# Patient Record
Sex: Female | Born: 1945 | Race: Black or African American | Hispanic: No | Smoking: Never smoker
Health system: Southern US, Community
[De-identification: ages and names within clinical notes are randomized; demographics above are authoritative.]

## PROBLEM LIST (undated history)

## (undated) DIAGNOSIS — K5900 Constipation, unspecified: Secondary | ICD-10-CM

## (undated) DIAGNOSIS — R609 Edema, unspecified: Secondary | ICD-10-CM

## (undated) DIAGNOSIS — R34 Anuria and oliguria: Secondary | ICD-10-CM

## (undated) DIAGNOSIS — Z8719 Personal history of other diseases of the digestive system: Secondary | ICD-10-CM

## (undated) DIAGNOSIS — K922 Gastrointestinal hemorrhage, unspecified: Secondary | ICD-10-CM

## (undated) DIAGNOSIS — M2540 Effusion, unspecified joint: Secondary | ICD-10-CM

## (undated) DIAGNOSIS — M2550 Pain in unspecified joint: Secondary | ICD-10-CM

## (undated) DIAGNOSIS — N289 Disorder of kidney and ureter, unspecified: Secondary | ICD-10-CM

## (undated) DIAGNOSIS — H9190 Unspecified hearing loss, unspecified ear: Secondary | ICD-10-CM

## (undated) DIAGNOSIS — R7881 Bacteremia: Secondary | ICD-10-CM

## (undated) DIAGNOSIS — I10 Essential (primary) hypertension: Secondary | ICD-10-CM

## (undated) DIAGNOSIS — N2581 Secondary hyperparathyroidism of renal origin: Secondary | ICD-10-CM

## (undated) DIAGNOSIS — T82598A Other mechanical complication of other cardiac and vascular devices and implants, initial encounter: Secondary | ICD-10-CM

## (undated) DIAGNOSIS — K219 Gastro-esophageal reflux disease without esophagitis: Secondary | ICD-10-CM

## (undated) DIAGNOSIS — K759 Inflammatory liver disease, unspecified: Secondary | ICD-10-CM

## (undated) DIAGNOSIS — K259 Gastric ulcer, unspecified as acute or chronic, without hemorrhage or perforation: Secondary | ICD-10-CM

## (undated) DIAGNOSIS — K859 Acute pancreatitis without necrosis or infection, unspecified: Secondary | ICD-10-CM

## (undated) DIAGNOSIS — E039 Hypothyroidism, unspecified: Secondary | ICD-10-CM

## (undated) DIAGNOSIS — R42 Dizziness and giddiness: Secondary | ICD-10-CM

## (undated) DIAGNOSIS — M1990 Unspecified osteoarthritis, unspecified site: Secondary | ICD-10-CM

## (undated) DIAGNOSIS — D649 Anemia, unspecified: Secondary | ICD-10-CM

## (undated) DIAGNOSIS — Z9289 Personal history of other medical treatment: Secondary | ICD-10-CM

## (undated) DIAGNOSIS — N939 Abnormal uterine and vaginal bleeding, unspecified: Secondary | ICD-10-CM

## (undated) DIAGNOSIS — R600 Localized edema: Secondary | ICD-10-CM

## (undated) DIAGNOSIS — F0390 Unspecified dementia without behavioral disturbance: Secondary | ICD-10-CM

## (undated) DIAGNOSIS — E119 Type 2 diabetes mellitus without complications: Secondary | ICD-10-CM

## (undated) DIAGNOSIS — IMO0002 Reserved for concepts with insufficient information to code with codable children: Secondary | ICD-10-CM

## (undated) DIAGNOSIS — L03114 Cellulitis of left upper limb: Secondary | ICD-10-CM

## (undated) DIAGNOSIS — IMO0001 Reserved for inherently not codable concepts without codable children: Secondary | ICD-10-CM

## (undated) DIAGNOSIS — A419 Sepsis, unspecified organism: Secondary | ICD-10-CM

## (undated) DIAGNOSIS — Z87442 Personal history of urinary calculi: Secondary | ICD-10-CM

## (undated) DIAGNOSIS — R51 Headache: Secondary | ICD-10-CM

## (undated) DIAGNOSIS — L853 Xerosis cutis: Secondary | ICD-10-CM

## (undated) DIAGNOSIS — M25519 Pain in unspecified shoulder: Secondary | ICD-10-CM

## (undated) HISTORY — PX: ARTERIOVENOUS GRAFT PLACEMENT: SUR1029

## (undated) HISTORY — PX: SHOULDER SURGERY: SHX246

## (undated) HISTORY — PX: OTHER SURGICAL HISTORY: SHX169

---

## 2011-03-16 ENCOUNTER — Ambulatory Visit
Admission: RE | Admit: 2011-03-16 | Discharge: 2011-03-16 | Disposition: A | Discharge status: Home / Self Care | Payer: Medicare Other | Source: Ambulatory Visit | Attending: Nephrology | Admitting: Nephrology

## 2011-03-16 ENCOUNTER — Other Ambulatory Visit: Payer: Self-pay | Admitting: Nephrology

## 2011-03-16 DIAGNOSIS — R52 Pain, unspecified: Secondary | ICD-10-CM

## 2011-04-27 ENCOUNTER — Other Ambulatory Visit: Payer: Self-pay | Admitting: Orthopedic Surgery

## 2011-04-27 DIAGNOSIS — M25512 Pain in left shoulder: Secondary | ICD-10-CM

## 2011-05-09 ENCOUNTER — Ambulatory Visit
Admission: RE | Admit: 2011-05-09 | Discharge: 2011-05-09 | Disposition: A | Discharge status: Home / Self Care | Payer: Medicare Other | Source: Ambulatory Visit | Attending: Orthopedic Surgery | Admitting: Orthopedic Surgery

## 2011-05-09 DIAGNOSIS — M25512 Pain in left shoulder: Secondary | ICD-10-CM

## 2011-06-06 ENCOUNTER — Other Ambulatory Visit: Payer: Self-pay

## 2011-06-06 ENCOUNTER — Emergency Department (HOSPITAL_COMMUNITY): Payer: Medicare (Managed Care)

## 2011-06-06 ENCOUNTER — Inpatient Hospital Stay (HOSPITAL_COMMUNITY)
Admission: EM | Admit: 2011-06-06 | Discharge: 2011-06-14 | DRG: 444 | Disposition: A | Discharge status: Home / Self Care | Payer: Medicare (Managed Care) | Attending: Internal Medicine | Admitting: Internal Medicine

## 2011-06-06 ENCOUNTER — Encounter (HOSPITAL_COMMUNITY): Payer: Self-pay | Admitting: *Deleted

## 2011-06-06 DIAGNOSIS — E871 Hypo-osmolality and hyponatremia: Secondary | ICD-10-CM | POA: Diagnosis not present

## 2011-06-06 DIAGNOSIS — N186 End stage renal disease: Secondary | ICD-10-CM | POA: Diagnosis present

## 2011-06-06 DIAGNOSIS — R7989 Other specified abnormal findings of blood chemistry: Secondary | ICD-10-CM | POA: Diagnosis present

## 2011-06-06 DIAGNOSIS — D62 Acute posthemorrhagic anemia: Secondary | ICD-10-CM | POA: Diagnosis present

## 2011-06-06 DIAGNOSIS — Z992 Dependence on renal dialysis: Secondary | ICD-10-CM

## 2011-06-06 DIAGNOSIS — K264 Chronic or unspecified duodenal ulcer with hemorrhage: Secondary | ICD-10-CM | POA: Diagnosis present

## 2011-06-06 DIAGNOSIS — M25519 Pain in unspecified shoulder: Secondary | ICD-10-CM | POA: Diagnosis present

## 2011-06-06 DIAGNOSIS — K8001 Calculus of gallbladder with acute cholecystitis with obstruction: Principal | ICD-10-CM | POA: Diagnosis present

## 2011-06-06 DIAGNOSIS — I10 Essential (primary) hypertension: Secondary | ICD-10-CM

## 2011-06-06 DIAGNOSIS — K254 Chronic or unspecified gastric ulcer with hemorrhage: Secondary | ICD-10-CM | POA: Diagnosis present

## 2011-06-06 DIAGNOSIS — K8019 Calculus of gallbladder with other cholecystitis with obstruction: Secondary | ICD-10-CM

## 2011-06-06 DIAGNOSIS — K7460 Unspecified cirrhosis of liver: Secondary | ICD-10-CM | POA: Diagnosis present

## 2011-06-06 DIAGNOSIS — Z79899 Other long term (current) drug therapy: Secondary | ICD-10-CM

## 2011-06-06 DIAGNOSIS — R109 Unspecified abdominal pain: Secondary | ICD-10-CM

## 2011-06-06 DIAGNOSIS — R768 Other specified abnormal immunological findings in serum: Secondary | ICD-10-CM | POA: Diagnosis present

## 2011-06-06 DIAGNOSIS — K859 Acute pancreatitis without necrosis or infection, unspecified: Secondary | ICD-10-CM | POA: Diagnosis present

## 2011-06-06 DIAGNOSIS — R10811 Right upper quadrant abdominal tenderness: Secondary | ICD-10-CM | POA: Diagnosis present

## 2011-06-06 DIAGNOSIS — R7401 Elevation of levels of liver transaminase levels: Secondary | ICD-10-CM | POA: Diagnosis present

## 2011-06-06 DIAGNOSIS — E039 Hypothyroidism, unspecified: Secondary | ICD-10-CM | POA: Diagnosis present

## 2011-06-06 DIAGNOSIS — I8500 Esophageal varices without bleeding: Secondary | ICD-10-CM | POA: Diagnosis present

## 2011-06-06 DIAGNOSIS — N189 Chronic kidney disease, unspecified: Secondary | ICD-10-CM | POA: Diagnosis present

## 2011-06-06 DIAGNOSIS — N2581 Secondary hyperparathyroidism of renal origin: Secondary | ICD-10-CM | POA: Diagnosis present

## 2011-06-06 DIAGNOSIS — K922 Gastrointestinal hemorrhage, unspecified: Secondary | ICD-10-CM

## 2011-06-06 DIAGNOSIS — I120 Hypertensive chronic kidney disease with stage 5 chronic kidney disease or end stage renal disease: Secondary | ICD-10-CM | POA: Diagnosis present

## 2011-06-06 DIAGNOSIS — K766 Portal hypertension: Secondary | ICD-10-CM | POA: Diagnosis present

## 2011-06-06 DIAGNOSIS — R7402 Elevation of levels of lactic acid dehydrogenase (LDH): Secondary | ICD-10-CM | POA: Diagnosis present

## 2011-06-06 DIAGNOSIS — Z6830 Body mass index (BMI) 30.0-30.9, adult: Secondary | ICD-10-CM

## 2011-06-06 DIAGNOSIS — K319 Disease of stomach and duodenum, unspecified: Secondary | ICD-10-CM | POA: Diagnosis present

## 2011-06-06 DIAGNOSIS — D631 Anemia in chronic kidney disease: Secondary | ICD-10-CM | POA: Diagnosis present

## 2011-06-06 DIAGNOSIS — E46 Unspecified protein-calorie malnutrition: Secondary | ICD-10-CM | POA: Diagnosis present

## 2011-06-06 HISTORY — DX: Gastrointestinal hemorrhage, unspecified: K92.2

## 2011-06-06 HISTORY — DX: Disorder of kidney and ureter, unspecified: N28.9

## 2011-06-06 HISTORY — DX: Pain in unspecified shoulder: M25.519

## 2011-06-06 HISTORY — DX: Essential (primary) hypertension: I10

## 2011-06-06 LAB — CBC
Platelets: 133 10*3/uL — ABNORMAL LOW (ref 150–400)
RDW: 19.6 % — ABNORMAL HIGH (ref 11.5–15.5)
WBC: 7.5 10*3/uL (ref 4.0–10.5)

## 2011-06-06 LAB — COMPREHENSIVE METABOLIC PANEL
AST: 410 U/L — ABNORMAL HIGH (ref 0–37)
Albumin: 3.1 g/dL — ABNORMAL LOW (ref 3.5–5.2)
Alkaline Phosphatase: 99 U/L (ref 39–117)
Chloride: 86 mEq/L — ABNORMAL LOW (ref 96–112)
Creatinine, Ser: 6.02 mg/dL — ABNORMAL HIGH (ref 0.50–1.10)
Potassium: 3.6 mEq/L (ref 3.5–5.1)
Sodium: 136 mEq/L (ref 135–145)
Total Bilirubin: 0.7 mg/dL (ref 0.3–1.2)

## 2011-06-06 LAB — TYPE AND SCREEN: Antibody Screen: NEGATIVE

## 2011-06-06 LAB — PROTIME-INR: Prothrombin Time: 17.4 seconds — ABNORMAL HIGH (ref 11.6–15.2)

## 2011-06-06 MED ORDER — PANTOPRAZOLE SODIUM 40 MG IV SOLR: 40.0000 mg | Freq: Once | INTRAVENOUS | Status: DC

## 2011-06-06 MED ORDER — SODIUM CHLORIDE 0.9 % IV BOLUS (SEPSIS)
500.0000 mL | INTRAVENOUS | Status: AC
  Administered 2011-06-06: 500 mL via INTRAVENOUS

## 2011-06-06 MED ORDER — PANTOPRAZOLE SODIUM 40 MG IV SOLR
40.0000 mg | Freq: Once | INTRAVENOUS | Status: AC
  Administered 2011-06-06: 40 mg via INTRAVENOUS
  Filled 2011-06-06: qty 40

## 2011-06-06 MED ORDER — MORPHINE SULFATE 2 MG/ML IJ SOLN
2.0000 mg | Freq: Once | INTRAMUSCULAR | Status: AC
  Administered 2011-06-06: 2 mg via INTRAVENOUS
  Filled 2011-06-06: qty 1

## 2011-06-06 NOTE — ED Notes (Signed)
MD at bedside. 

## 2011-06-06 NOTE — ED Notes (Signed)
EKG given to Dr. Allen 

## 2011-06-06 NOTE — ED Notes (Signed)
Pt states she is having abdominal pain. Pt states she feels like her abdomin is pushing up on her chest and causing her to become sob. Pt denies any chest pain. Pt states her pain is radiating around her stomach. Pt c/o emesis x1

## 2011-06-06 NOTE — ED Provider Notes (Addendum)
History     CSN: [id number]  Arrival date & time 06/06/11  1716   First MD Initiated Contact with Patient 06/06/11 1946      Chief Complaint  Patient presents with  . Abdominal Pain  . Shortness of Breath   patient with a history of hypertension, chronic renal disorder. States that she began having abdominal pain, and some distention. Earlier today. She then felt that her distended abdomen was pushing up on her chest causing her to have some shortness of breath. However, she denies any chest pain. She denies any fever or cough. She did note some "black stool." Today and also, states she vomited some dark colored emesis. She's had no back pain or syncope. She states that she did complete her dialysis yesterday.  No acute distress at this time  (Consider location/radiation/quality/duration/timing/severity/associated sxs/prior treatment) HPI  Past Medical History  Diagnosis Date  . Hypertension   . Renal disorder   . Shoulder pain     Past Surgical History  Procedure Date  . Shoulder surgery     No family history on file.  History  Substance Use Topics  . Smoking status: Never Smoker   . Smokeless tobacco: Current User    Types: Chew  . Alcohol Use: No    OB History    Grav Para Term Preterm Abortions TAB SAB Ect Mult Living                  Review of Systems  All other systems reviewed and are negative.    Allergies  Review of patient's allergies indicates no known allergies.  Home Medications   Current Outpatient Rx  Name Route Sig Dispense Refill  . ASPIRIN EC 81 MG PO TBEC Oral Take 81 mg by mouth daily.    Marland Kitchen CALCIUM ACETATE 667 MG PO CAPS Oral Take 1,334 mg by mouth 3 (three) times daily with meals.    Marland Kitchen HYDROCODONE-ACETAMINOPHEN 5-325 MG PO TABS Oral Take 1 tablet by mouth every 6 (six) hours as needed. pain    . LEVOTHYROXINE SODIUM 100 MCG PO TABS Oral Take 100 mcg by mouth daily.    . ADULT MULTIVITAMIN W/MINERALS CH Oral Take 1 tablet by mouth  daily.    . TRAMADOL HCL 50 MG PO TABS Oral Take 50 mg by mouth every 6 (six) hours as needed. pain      BP 104/56  Pulse 96  Temp 99 F (37.2 C)  Resp 24  Ht 5\' 2"  (1.575 m)  Wt 140 lb (63.504 kg)  BMI 25.61 kg/m2  SpO2 99%  Physical Exam  Nursing note and vitals reviewed. Constitutional: She is oriented to person, place, and time. She appears well-developed and well-nourished.  HENT:  Head: Normocephalic and atraumatic.  Mouth/Throat: Oropharynx is clear and moist. No oropharyngeal exudate.  Eyes: Conjunctivae and EOM are normal. Pupils are equal, round, and reactive to light. No scleral icterus.  Neck: Neck supple. No thyromegaly present.  Cardiovascular: Normal rate and regular rhythm.  Exam reveals no gallop and no friction rub.   No murmur heard. Pulmonary/Chest: Breath sounds normal. She has no wheezes. She has no rales. She exhibits no tenderness.  Abdominal: Soft. Bowel sounds are normal. She exhibits distension. There is tenderness. There is no rebound and no guarding.       Abdomen is slightly distended. Bowel sounds are present. There is mild diffuse tenderness. No rebound, rigidity or guarding  Musculoskeletal: Normal range of motion.  Neurological: She is  alert and oriented to person, place, and time. No cranial nerve deficit. Coordination normal.  Skin: Skin is warm and dry. No rash noted.  Psychiatric: She has a normal mood and affect.    ED Course  Procedures (including critical care time)   Labs Reviewed  BASIC METABOLIC PANEL  CBC  COMPREHENSIVE METABOLIC PANEL  APTT  PROTIME-INR  TYPE AND SCREEN   No results found.   No diagnosis found.    MDM  Patient is seen and examined, initial history and physical is completed. Evaluation initiated    IV F fluid bolus has been ordered. Have also ordered a stool Hemoccult test, as well as type and screen, sig labs coagulation profile. We'll obtain stat abdominal x-rays, and chest x-ray, type and screen,  and will follow closely    Date: 06/06/2011  Rate: 102  Rhythm: sinus tachycardia  QRS Axis: normal  Intervals: normal  ST/T Wave abnormalities: nonspecific ST changes  Conduction Disutrbances:none  Narrative Interpretation:   Old EKG Reviewed: none available PVC.     ================================ CT from Jan 22  ---  *RADIOLOGY REPORT*  Clinical Data: Left shoulder pain.  CT OF THE LEFT SHOULDER WITHOUT CONTRAST  Technique: Multidetector CT imaging was performed according to the  standard protocol. Multiplanar CT image reconstructions were also  generated.  Comparison: Plain films left shoulder 03/16/2011.  Findings: There is no fracture or dislocation. The patient has  severe glenohumeral degenerative change. The glenoid bone is  markedly remodeled. An extensive portion of the superior glenoid  is severely thinned. There is only mild acromioclavicular  degenerative disease. As visualized by CT scan, the rotator cuff  appears intact. Imaged lung parenchyma is clear.  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  Original Report Authenticated By: Bernadene Bell. D'ALESSIO =================================================  Aziyah Provencal A. Patrica Duel, MD 06/06/11 2013  Results for orders placed during the hospital encounter of 06/06/11  CBC      Component Value Range   WBC 7.5  4.0 - 10.5 (K/uL)   RBC 2.94 (*) 3.87 - 5.11 (MIL/uL)   Hemoglobin 9.2 (*) 12.0 - 15.0 (g/dL)   HCT 09.8 (*) 11.9 - 46.0 (%)   MCV 95.2  78.0 - 100.0 (fL)   MCH 31.3  26.0 - 34.0 (pg)   MCHC 32.9  30.0 - 36.0 (g/dL)   RDW 14.7 (*) 82.9 - 15.5 (%)   Platelets 133 (*) 150 - 400 (K/uL)  COMPREHENSIVE METABOLIC PANEL      Component Value Range   Sodium 136  135 - 145 (mEq/L)   Potassium 3.6  3.5 - 5.1 (mEq/L)   Chloride 86 (*) 96 - 112 (mEq/L)   CO2 30  19 - 32 (mEq/L)   Glucose, Bld 86  70 - 99 (mg/dL)   BUN 61 (*) 6 - 23 (mg/dL)    Creatinine, Ser 5.62 (*) 0.50 - 1.10 (mg/dL)   Calcium 13.0  8.4 - 10.5 (mg/dL)   Total Protein 7.5  6.0 - 8.3 (g/dL)   Albumin 3.1 (*) 3.5 - 5.2 (g/dL)   AST 865 (*) 0 - 37 (U/L)   ALT 250 (*) 0 - 35 (U/L)   Alkaline Phosphatase 99  39 - 117 (U/L)   Total Bilirubin 0.7  0.3 - 1.2 (mg/dL)   GFR calc non Af Amer 7 (*) >90 (mL/min)   GFR calc Af Amer 8 (*) >90 (mL/min)  APTT      Component Value  Range   aPTT 35  24 - 37 (seconds)  PROTIME-INR      Component Value Range   Prothrombin Time 17.4 (*) 11.6 - 15.2 (seconds)   INR 1.40  0.00 - 1.49   TYPE AND SCREEN      Component Value Range   ABO/RH(D) A POS     Antibody Screen PENDING     Sample Expiration 06/09/2011     Ct Shoulder Left Wo Contrast  05/09/2011  *RADIOLOGY REPORT*  Clinical Data: Left shoulder pain.  CT OF THE LEFT SHOULDER WITHOUT CONTRAST  Technique:  Multidetector CT imaging was performed according to the standard protocol. Multiplanar CT image reconstructions were also generated.  Comparison: Plain films left shoulder 03/16/2011.  Findings: There is no fracture or dislocation.  The patient has severe glenohumeral degenerative change.  The glenoid bone is markedly remodeled.  An extensive portion of the superior glenoid is severely thinned.  There is only mild acromioclavicular degenerative disease.  As visualized by CT scan, the rotator cuff appears intact.  Imaged lung parenchyma is clear.  IMPRESSION: Severe glenohumeral degenerative disease with marked remodeling and thinning of the glenoid bone.  Original Report Authenticated By: Bernadene Bell. D'ALESSIO, M.D.   Dg Abd Acute W/chest  06/06/2011  *RADIOLOGY REPORT*  Clinical Data: 66 year old female with abdominal pain, shortness of breath, nausea and vomiting.  ACUTE ABDOMEN SERIES (ABDOMEN 2 VIEW & CHEST 1 VIEW)  Comparison: None.  Findings: Postoperative changes to the right glenohumeral joint. Cardiac size and mediastinal contours are within normal limits. Visualized  tracheal air column is within normal limits.  No pneumothorax or pneumoperitoneum.  No pulmonary edema, pleural effusion or consolidation.  Nonobstructed bowel gas pattern. No acute osseous abnormality identified.  IMPRESSION: Nonobstructed bowel gas pattern, no free air. No acute cardiopulmonary abnormality.  Original Report Authenticated By: Harley Hallmark, M.D.     Rectal exam completed with the female nursing chaperone present. Rectal tone was normal. Stool was dark, brown, and was guaiac-positive. Patient was also noted to be anemic with a hemoglobin of 9.2. INR was 1.4, normal electrolytes were normal. Try at hospital is paged for admission Protonix ordered. Abdominal x-rays were unremarkable. Ultrasound ordered, but will be pending.  Hilbert Briggs A. Patrica Duel, MD 06/06/11 2153

## 2011-06-07 ENCOUNTER — Encounter (HOSPITAL_COMMUNITY): Payer: Self-pay

## 2011-06-07 LAB — HEMOGLOBIN AND HEMATOCRIT, BLOOD
HCT: 15.5 % — ABNORMAL LOW (ref 36.0–46.0)
Hemoglobin: 5 g/dL — CL (ref 12.0–15.0)

## 2011-06-07 LAB — HEPATITIS B CORE ANTIBODY, IGM: Hep B C IgM: NEGATIVE

## 2011-06-07 LAB — FERRITIN: Ferritin: 1887 ng/mL — ABNORMAL HIGH (ref 10–291)

## 2011-06-07 LAB — HEPATITIS B SURFACE ANTIBODY,QUALITATIVE: Hep B S Ab: POSITIVE — AB

## 2011-06-07 LAB — ACETAMINOPHEN LEVEL: Acetaminophen (Tylenol), Serum: <15 ug/mL (ref 10–30)

## 2011-06-07 LAB — LIPASE, BLOOD: Lipase: 120 U/L — ABNORMAL HIGH (ref 11–59)

## 2011-06-07 MED ORDER — LEVOTHYROXINE SODIUM 100 MCG PO TABS
100.0000 ug | ORAL_TABLET | ORAL | Status: DC
  Administered 2011-06-07 – 2011-06-14 (×8): 100 ug via ORAL
  Filled 2011-06-07 (×11): qty 1

## 2011-06-07 MED ORDER — HYDROMORPHONE HCL PF 1 MG/ML IJ SOLN
0.5000 mg | INTRAMUSCULAR | Status: DC | PRN
  Administered 2011-06-07: 0.5 mg via INTRAVENOUS
  Filled 2011-06-07: qty 1

## 2011-06-07 MED ORDER — HYDROMORPHONE HCL PF 1 MG/ML IJ SOLN
1.0000 mg | INTRAMUSCULAR | Status: DC | PRN
  Administered 2011-06-07: 1 mg via INTRAVENOUS
  Filled 2011-06-07: qty 1

## 2011-06-07 MED ORDER — SODIUM CHLORIDE 0.9 % IV SOLN
8.0000 mg/h | INTRAVENOUS | Status: DC
  Administered 2011-06-07 (×2): 8 mg/h via INTRAVENOUS
  Filled 2011-06-07 (×6): qty 80

## 2011-06-07 MED ORDER — CALCIUM ACETATE 667 MG PO CAPS
1334.0000 mg | ORAL_CAPSULE | Freq: Three times a day (TID) | ORAL | Status: DC
  Administered 2011-06-07 – 2011-06-14 (×16): 1334 mg via ORAL
  Filled 2011-06-07 (×28): qty 2

## 2011-06-07 MED ORDER — SODIUM CHLORIDE 0.9 % IV SOLN
INTRAVENOUS | Status: DC
  Administered 2011-06-07: 1000 mL via INTRAVENOUS

## 2011-06-07 MED ORDER — SODIUM CHLORIDE 0.9 % IJ SOLN
3.0000 mL | Freq: Two times a day (BID) | INTRAMUSCULAR | Status: DC
  Administered 2011-06-08 – 2011-06-13 (×10): 3 mL via INTRAVENOUS

## 2011-06-07 NOTE — ED Notes (Signed)
Patient denies pain and is resting comfortably.  

## 2011-06-07 NOTE — Treatment Plan (Signed)
Called by RN at approx 21:45 to notify of critical lab Hg 5.0.  Upon review of chart, pt was transferred from [facility] ED this afternoon and arrived approx 17:00 for GIB.  Noted Hg of 9.2 at 19:20 yesterday and although other labs ordered/appear on record, they were not collected and/or result until arrival at [facility] and new labs obtained at 21:12.  Initial history mentions coffee ground emesis and RN reports melanotic stools (black and tarry).  Pt's current BP is 86/40 with a pulse of 93 and has been in 90s and 100s earlier. Of note, she is ESRD on HD and is due her treatment is due today.  Assessment:  Likely upper GI bleed, currently on protonix drip via 1 peripheral IV on telemetry floor  Plan: Type and screen and transfuse 2 units PRBC, keep 2 on standby Place 2nd peripheral IV (needs blood and protonix) Transfer to stepdown unit  Cycle Hg Called and discussed with Dr. Loreta Ave, she agrees with current measures and will see in AM for EGD (NPO) Called and discussed with Dr. Hyman Hopes given need for transfusions/volume and ESRD on HD, will do HD in AM

## 2011-06-07 NOTE — Progress Notes (Signed)
Patient ID: Samantha [name], female   DOB: November 06, 1945, 66 y.o.   MRN: [id number] Subjective: Patient is a 66 year old African American female with history of ESRD disease on dialysis 3 times a week i.e. Monday Wednesday Friday was transferred from [facility] long hospital to [facility] cone with complains of abdominal pain of 3 days' duration with associated coffee-ground emesis and melanotic stool. The abdominal pain was mainly located in the epigastric region radiating to the back as well as to the right subcostal region. She ever denies any history of fever, chills or Rigors. She also denies any history of chest pain or shortness of breath.        At the time patient was seen by me, shows to complain of abdominal pain located in the epigastric region with associated nausea, one episode of vomiting and melanotic stool. She denies any fever chills or Rigors.      A review of the patient's hematologic indices showed hemoglobin to be 9.2g, BUN and creatinine 61 and 6.20 respectively. Ultrasound of the abdomen abdomen showed mild cholecystitis with a stone located at the neck of the gallbladder. Liver function test showed elevated transaminases.  Objective: Weight change:  No intake or output data in the 24 hours ending 06/07/11 1832 BP 103/53  Pulse 86  Temp(Src) 98.5 F (36.9 C) (Oral)  Resp 17  Ht 5\' 4"  (1.626 m)  Wt 73.2 kg (161 lb 6 oz)  BMI 27.70 kg/m2  SpO2 100% Physical Exam: General appearance: alert, cooperative and no distress, pale, mildly dehydrated. Head: Normocephalic, without obvious abnormality, atraumatic Neck: no adenopathy, no carotid bruit, no JVD, supple, symmetrical, trachea midline and thyroid not enlarged, symmetric, no tenderness/mass/nodules Lungs: clear to auscultation bilaterally Heart: regular rate and rhythm, S1, S2 normal, no murmur, click, rub or gallop Abdomen: soft, tenderness in the epigastric as well as in the right upper quadrant, no guarding no rigidity, no  organomegaly, bowel sounds positive. Extremities: +1-2 pitting edema Skin: Hyperpigmentation with slightly thicken skin   Lab Results: Results for orders placed during the hospital encounter of 06/06/11 (from the past 48 hour(s))  CBC     Status: Abnormal   Collection Time   06/06/11  7:20 PM      Component Value Range Comment   WBC 7.5  4.0 - 10.5 (K/uL)    RBC 2.94 (*) 3.87 - 5.11 (MIL/uL)    Hemoglobin 9.2 (*) 12.0 - 15.0 (g/dL)    HCT 84.6 (*) 96.2 - 46.0 (%)    MCV 95.2  78.0 - 100.0 (fL)    MCH 31.3  26.0 - 34.0 (pg)    MCHC 32.9  30.0 - 36.0 (g/dL)    RDW 95.2 (*) 84.1 - 15.5 (%)    Platelets 133 (*) 150 - 400 (K/uL)   COMPREHENSIVE METABOLIC PANEL     Status: Abnormal   Collection Time   06/06/11  7:20 PM      Component Value Range Comment   Sodium 136  135 - 145 (mEq/L)    Potassium 3.6  3.5 - 5.1 (mEq/L)    Chloride 86 (*) 96 - 112 (mEq/L)    CO2 30  19 - 32 (mEq/L)    Glucose, Bld 86  70 - 99 (mg/dL)    BUN 61 (*) 6 - 23 (mg/dL)    Creatinine, Ser 3.24 (*) 0.50 - 1.10 (mg/dL)    Calcium 40.1  8.4 - 10.5 (mg/dL)    Total Protein 7.5  6.0 - 8.3 (g/dL)  Albumin 3.1 (*) 3.5 - 5.2 (g/dL)    AST 409 (*) 0 - 37 (U/L)    ALT 250 (*) 0 - 35 (U/L)    Alkaline Phosphatase 99  39 - 117 (U/L)    Total Bilirubin 0.7  0.3 - 1.2 (mg/dL)    GFR calc non Af Amer 7 (*) >90 (mL/min)    GFR calc Af Amer 8 (*) >90 (mL/min)   APTT     Status: Normal   Collection Time   06/06/11  7:20 PM      Component Value Range Comment   aPTT 35  24 - 37 (seconds)   PROTIME-INR     Status: Abnormal   Collection Time   06/06/11  7:20 PM      Component Value Range Comment   Prothrombin Time 17.4 (*) 11.6 - 15.2 (seconds)    INR 1.40  0.00 - 1.49    TYPE AND SCREEN     Status: Normal   Collection Time   06/06/11  8:30 PM      Component Value Range Comment   ABO/RH(D) A POS      Antibody Screen NEG      Sample Expiration 06/09/2011     ABO/RH     Status: Normal   Collection Time   06/06/11   8:30 PM      Component Value Range Comment   ABO/RH(D) A POS     OCCULT BLOOD, POC DEVICE     Status: Normal   Collection Time   06/06/11  9:52 PM      Component Value Range Comment   Fecal Occult Bld POSITIVE     LIPASE, BLOOD     Status: Abnormal   Collection Time   06/07/11  3:04 AM      Component Value Range Comment   Lipase 120 (*) 11 - 59 (U/L)   HEPATITIS B SURFACE ANTIBODY     Status: Abnormal   Collection Time   06/07/11  3:04 AM      Component Value Range Comment   Hep B S Ab POSITIVE (*) NEGATIVE    HEPATITIS B CORE ANTIBODY, IGM     Status: Normal   Collection Time   06/07/11  3:04 AM      Component Value Range Comment   Hep B C IgM NEGATIVE  NEGATIVE    FERRITIN     Status: Abnormal   Collection Time   06/07/11  3:04 AM      Component Value Range Comment   Ferritin 1887 (*) 10 - 291 (ng/mL) Result confirmed by automatic dilution.  ACETAMINOPHEN LEVEL     Status: Normal   Collection Time   06/07/11  3:04 AM      Component Value Range Comment   Acetaminophen (Tylenol), Serum <15.0  10 - 30 (ug/mL)   GLUCOSE, CAPILLARY     Status: Normal   Collection Time   06/07/11  5:28 PM      Component Value Range Comment   Glucose-Capillary 99  70 - 99 (mg/dL)     Micro Results: No results found for this or any previous visit (from the past 240 hour(s)).  Studies/Results: US Abdomen Complete  06/07/2011  *RADIOLOGY REPORT*  Clinical Data:  Abdominal pain; elevated LFTs.  ABDOMINAL ULTRASOUND COMPLETE  Comparison:  Abdominal radiograph performed earlier today at 08:24 p.m.  Findings:  Gallbladder:  There appears to be a 1.2 cm stone lodged at the neck of the gallbladder.  The  gallbladder wall is mildly thickened and edematous, with suggestion of trace associated pericholecystic fluid.  A positive ultrasonographic Murphy's sign is seen. Findings raise concern for mild acute cholecystitis.  Common Bile Duct:  0.5 cm in diameter; within normal limits in caliber.  Liver:  Heterogeneous  echotexture, with a mildly nodular contour, raising question for mild cirrhotic change; no focal lesions identified.  Limited Doppler evaluation demonstrates normal blood flow within the liver.  IVC:  Unremarkable in appearance.  Pancreas:  Although the pancreas is difficult to visualize in its entirety due to overlying bowel gas, no focal pancreatic abnormality is identified.  Spleen:  10.2 cm in length; within normal limits in size and echotexture.  Right kidney:  5.7 cm in length; markedly diminutive, with diffusely increased cortical echogenicity, reflecting the patient's chronic renal atrophy.  A few small cysts are suggested.  No suspicious mass seen; no evidence of hydronephrosis.  Left kidney:  5.5 cm in length; markedly diminutive, with diffusely increased cortical echogenicity, reflecting the patient's chronic renal atrophy.  A few small cysts are noted.  No suspicious mass seen; no evidence of hydronephrosis.  Abdominal Aorta:  Normal in caliber; no aneurysm identified.  The aortic bifurcation is not visualized due to overlying bowel gas.  IMPRESSION:  1.  Suspect mild acute cholecystitis, with a 1.2 cm stone lodged at the neck of the gallbladder, and mild gallbladder wall thickening and edema, with suggestion of trace pericholecystic fluid. Positive ultrasonographic Murphy's sign elicited.  No evidence of biliary duct dilatation to suggest more distal obstruction. 2.  Mildly nodular contour of the liver raises question for mild cirrhotic change. 3.  Chronic bilateral renal atrophy noted, with few small scattered cysts seen.  Original Report Authenticated By: Tonia Ghent, M.D.   Ct Shoulder Left Wo Contrast  05/09/2011  *RADIOLOGY REPORT*  Clinical Data: Left shoulder pain.  CT OF THE LEFT SHOULDER WITHOUT CONTRAST  Technique:  Multidetector CT imaging was performed according to the standard protocol. Multiplanar CT image reconstructions were also generated.  Comparison: Plain films left shoulder  03/16/2011.  Findings: There is no fracture or dislocation.  The patient has severe glenohumeral degenerative change.  The glenoid bone is markedly remodeled.  An extensive portion of the superior glenoid is severely thinned.  There is only mild acromioclavicular degenerative disease.  As visualized by CT scan, the rotator cuff appears intact.  Imaged lung parenchyma is clear.  IMPRESSION: Severe glenohumeral degenerative disease with marked remodeling and thinning of the glenoid bone.  Original Report Authenticated By: Bernadene Bell. D'ALESSIO, M.D.   Dg Abd Acute W/chest  06/06/2011  *RADIOLOGY REPORT*  Clinical Data: 66 year old female with abdominal pain, shortness of breath, nausea and vomiting.  ACUTE ABDOMEN SERIES (ABDOMEN 2 VIEW & CHEST 1 VIEW)  Comparison: None.  Findings: Postoperative changes to the right glenohumeral joint. Cardiac size and mediastinal contours are within normal limits. Visualized tracheal air column is within normal limits.  No pneumothorax or pneumoperitoneum.  No pulmonary edema, pleural effusion or consolidation.  Nonobstructed bowel gas pattern. No acute osseous abnormality identified.  IMPRESSION: Nonobstructed bowel gas pattern, no free air. No acute cardiopulmonary abnormality.  Original Report Authenticated By: Harley Hallmark, M.D.   Medications: Scheduled Meds:   . calcium acetate  1,334 mg Oral TID WC  . levothyroxine  100 mcg Oral [id number]  .  morphine injection  2 mg Intravenous Once  . pantoprazole (PROTONIX) IV  40 mg Intravenous Once  . sodium chloride  500 mL Intravenous  STAT  . sodium chloride  3 mL Intravenous Q12H  . DISCONTD: pantoprazole (PROTONIX) IV  40 mg Intravenous Once   Continuous Infusions:   . sodium chloride 1,000 mL (06/07/11 1749)  . pantoprozole (PROTONIX) infusion 8 mg/hr (06/07/11 0959)   PRN Meds:.HYDROmorphone, DISCONTD: HYDROmorphone  Assessment/Plan:  Problems: #1 abdominal pain-located mainly in the epigastric as well as in  the right upper quadrant #2 coffee-ground emesis #3 melanotic stool #4 anemia. #5 abnormal LFT #6 elevated lipase #7 stone at the cystic duct #8 elevated BUN and creatinine.  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 #6 labs; repeat CMP, LFT , lipase in a.m. and H&H every 6 hours Patient will be followed and evaluated on daily basis.          Dominique Ressel   LOS: 1 day   Anam Bobby 06/07/2011, 6:32 PM

## 2011-06-07 NOTE — H&P (Signed)
PCP: No primary care    Chief Complaint:  Abdominal pain   HPI: Samantha [name] is an 66 y.o. female with history of end-stage renal disease on hemodialysis (Monday, Wednesday, Friday), left shoulder pain status post surgery, hypertension, presents to the emergency room with two-day history of diffuse abdominal pain, coffee-ground emesis, and a bit of melanotic stool. She denied any fever or chills. Her grandson, at her bedside, related to she has been taking ibuprofen as needed for her various pain. She has been on aspirin as well. She denied taking Pepto-Bismol or taking iron supplements. She has had no diarrhea. Evaluation in the emergency room included a hemoglobin of 9.5, potassium of 3.6 and creatinine of 6.0. She was also noted to have significant elevation of her liver function tests. Her abdominal series showed no acute process. It should be noted that her INR is in the normal range of 1.4. Hospitalist was asked to admit her for GI bleed.  Rewiew of Systems:  The patient denies anorexia, fever, weight loss,, vision loss, decreased hearing, hoarseness, chest pain, syncope, dyspnea on exertion, peripheral edema, balance deficits, hemoptysis, hematochezia, severe indigestion/heartburn, hematuria, incontinence, genital sores, muscle weakness, suspicious skin lesions, transient blindness, difficulty walking, depression, unusual weight change, abnormal bleeding, enlarged lymph nodes, angioedema, and breast masses.    Past Medical History  Diagnosis Date  . Hypertension   . Renal disorder   . Shoulder pain     Past Surgical History  Procedure Date  . Shoulder surgery     Medications:  HOME MEDS: Prior to Admission medications   Medication Sig Start Date End Date Taking? Authorizing Provider  aspirin EC 81 MG tablet Take 81 mg by mouth daily.   Yes Historical Provider, MD  calcium acetate (PHOSLO) 667 MG capsule Take 1,334 mg by mouth 3 (three) times daily with meals.   Yes  Historical Provider, MD  HYDROcodone-acetaminophen (NORCO) 5-325 MG per tablet Take 1 tablet by mouth every 6 (six) hours as needed. pain   Yes Historical Provider, MD  levothyroxine (SYNTHROID, LEVOTHROID) 100 MCG tablet Take 100 mcg by mouth daily.   Yes Historical Provider, MD  Multiple Vitamin (MULITIVITAMIN WITH MINERALS) TABS Take 1 tablet by mouth daily.   Yes Historical Provider, MD  traMADol (ULTRAM) 50 MG tablet Take 50 mg by mouth every 6 (six) hours as needed. pain   Yes Historical Provider, MD     Allergies:  No Known Allergies  Social History:   reports that she has never smoked. Her smokeless tobacco use includes Chew. She reports that she does not drink alcohol or use illicit drugs.  Family History: No family history on file.   Physical Exam: Filed Vitals:   06/06/11 2300 06/06/11 2320 06/06/11 2325 06/06/11 2340  BP: 110/47 106/47 103/51 109/48  Pulse:   99 100  Temp:   98.7 F (37.1 C)   TempSrc:   Oral   Resp: 10 11 13 15   Height:      Weight:      SpO2:   100% 100%   Blood pressure 109/48, pulse 100, temperature 98.7 F (37.1 C), temperature source Oral, resp. rate 15, height 5\' 2"  (1.575 m), weight 63.504 kg (140 lb), SpO2 100.00%.  GEN:  Pleasant person lying in the stretcher in no acute distress; cooperative with exam. She constantly asking for pain medication PSYCH:  alert and oriented x4; does not appear anxious does not appear depressed; affect is normal HEENT: Mucous membranes pink and anicteric; PERRLA;  EOM intact; no cervical lymphadenopathy nor thyromegaly or carotid bruit; no JVD; Breasts:: Not examined CHEST WALL: No tenderness CHEST: Normal respiration, clear to auscultation bilaterally HEART: Regular rate and rhythm; no murmurs rubs or gallops BACK: No kyphosis or scoliosis; no CVA tenderness ABDOMEN: Obese, soft slightly tender diffusely, no masses, no organomegaly, normal abdominal bowel sounds; no pannus; no intertriginous  candida. Rectal Exam: Not done EXTREMITIES: No bone or joint deformity; age-appropriate arthropathy of the hands and knees; no edema; no ulcerations. Genitalia: not examined PULSES: 2+ and symmetric SKIN: Normal hydration no rash or ulceration CNS: Cranial nerves 2-12 grossly intact no focal neurologic deficit   Labs & Imaging Results for orders placed during the hospital encounter of 06/06/11 (from the past 48 hour(s))  CBC     Status: Abnormal   Collection Time   06/06/11  7:20 PM      Component Value Range Comment   WBC 7.5  4.0 - 10.5 (K/uL)    RBC 2.94 (*) 3.87 - 5.11 (MIL/uL)    Hemoglobin 9.2 (*) 12.0 - 15.0 (g/dL)    HCT 16.1 (*) 09.6 - 46.0 (%)    MCV 95.2  78.0 - 100.0 (fL)    MCH 31.3  26.0 - 34.0 (pg)    MCHC 32.9  30.0 - 36.0 (g/dL)    RDW 04.5 (*) 40.9 - 15.5 (%)    Platelets 133 (*) 150 - 400 (K/uL)   COMPREHENSIVE METABOLIC PANEL     Status: Abnormal   Collection Time   06/06/11  7:20 PM      Component Value Range Comment   Sodium 136  135 - 145 (mEq/L)    Potassium 3.6  3.5 - 5.1 (mEq/L)    Chloride 86 (*) 96 - 112 (mEq/L)    CO2 30  19 - 32 (mEq/L)    Glucose, Bld 86  70 - 99 (mg/dL)    BUN 61 (*) 6 - 23 (mg/dL)    Creatinine, Ser 8.11 (*) 0.50 - 1.10 (mg/dL)    Calcium 91.4  8.4 - 10.5 (mg/dL)    Total Protein 7.5  6.0 - 8.3 (g/dL)    Albumin 3.1 (*) 3.5 - 5.2 (g/dL)    AST 782 (*) 0 - 37 (U/L)    ALT 250 (*) 0 - 35 (U/L)    Alkaline Phosphatase 99  39 - 117 (U/L)    Total Bilirubin 0.7  0.3 - 1.2 (mg/dL)    GFR calc non Af Amer 7 (*) >90 (mL/min)    GFR calc Af Amer 8 (*) >90 (mL/min)   APTT     Status: Normal   Collection Time   06/06/11  7:20 PM      Component Value Range Comment   aPTT 35  24 - 37 (seconds)   PROTIME-INR     Status: Abnormal   Collection Time   06/06/11  7:20 PM      Component Value Range Comment   Prothrombin Time 17.4 (*) 11.6 - 15.2 (seconds)    INR 1.40  0.00 - 1.49    TYPE AND SCREEN     Status: Normal   Collection Time    06/06/11  8:30 PM      Component Value Range Comment   ABO/RH(D) A POS      Antibody Screen NEG      Sample Expiration 06/09/2011     OCCULT BLOOD, POC DEVICE     Status: Normal   Collection Time   06/06/11  9:52 PM      Component Value Range Comment   Fecal Occult Bld POSITIVE      Dg Abd Acute W/chest  06/06/2011  *RADIOLOGY REPORT*  Clinical Data: 66 year old female with abdominal pain, shortness of breath, nausea and vomiting.  ACUTE ABDOMEN SERIES (ABDOMEN 2 VIEW & CHEST 1 VIEW)  Comparison: None.  Findings: Postoperative changes to the right glenohumeral joint. Cardiac size and mediastinal contours are within normal limits. Visualized tracheal air column is within normal limits.  No pneumothorax or pneumoperitoneum.  No pulmonary edema, pleural effusion or consolidation.  Nonobstructed bowel gas pattern. No acute osseous abnormality identified.  IMPRESSION: Nonobstructed bowel gas pattern, no free air. No acute cardiopulmonary abnormality.  Original Report Authenticated By: Harley Hallmark, M.D.      Assessment Present on Admission:  .GI bleed .Abdominal  pain, other specified site Elevation of liver function tests  PLAN: Will admit her to telemetry, DC aspirin and ibuprofen, start her on Protonix drip. She will need serial H&H as well. I suspect that she's not having a major bleed, given that her hemoglobin is at 9.2 g per decaliter. Her anemia is also likely because of chronic renal failure. Will obtain a right upper quadrant ultrasound, along with lipase, and hepatitis serologies. Please consult GI in the morning for further recommendation. She is stable, full code, and will be admitted to triad hospitalist service.   Other plans as per orders.   Oletta Buehring 06/07/2011, 1:15 AM

## 2011-06-07 NOTE — Progress Notes (Signed)
Had explained to patient that Hgb was 5.0 and that the doctor wants [place] to give her some blood. Patient said OK. When I brought the consent in for the patient to sign patient said she could not read it. After I read over the consent patient seemed surprised that she was getting blood even though this was explained to her prior. Not sure if patient understands d/t pt is hard of hearing. Patient said to call her daughter Pattricia Boss. Tawanna Solo and received phone consent for blood transfusion verified by 2nd RN. Steele Berg RN

## 2011-06-07 NOTE — ED Notes (Signed)
Patient is resting comfortably. 

## 2011-06-07 NOTE — Progress Notes (Signed)
CRITICAL VALUE ALERT  Critical value received:  hgb 5.0  Date of notification:  06/07/11 Time of notification:  21:45  Critical value read back: yes  Nurse who received alert:  Sharen Heck  MD notified (1st page):  Dr. Debbora Lacrosse  Time of first page:  21:49  MD notified (2nd page):  Time of second page:  Responding MD:  Dr. Debbora Lacrosse  Time MD responded:  21:54

## 2011-06-07 NOTE — ED Notes (Signed)
Vital signs stable. 

## 2011-06-07 NOTE — Progress Notes (Signed)
Pt admitted to room, oriented, safety issues gone over, informed pt and family she is in a camera room, nonsmoker. Call bell in reach, side rails up x3, bed alarm on, condition help gone over. Iv fluids started,  heart monitor on.arm band on pt and verified. Pink band on Left arm.

## 2011-06-08 ENCOUNTER — Inpatient Hospital Stay (HOSPITAL_COMMUNITY): Payer: Medicare (Managed Care)

## 2011-06-08 DIAGNOSIS — K922 Gastrointestinal hemorrhage, unspecified: Secondary | ICD-10-CM

## 2011-06-08 DIAGNOSIS — R768 Other specified abnormal immunological findings in serum: Secondary | ICD-10-CM | POA: Diagnosis present

## 2011-06-08 DIAGNOSIS — D62 Acute posthemorrhagic anemia: Secondary | ICD-10-CM | POA: Diagnosis present

## 2011-06-08 DIAGNOSIS — K819 Cholecystitis, unspecified: Secondary | ICD-10-CM

## 2011-06-08 DIAGNOSIS — R7689 Other specified abnormal immunological findings in serum: Secondary | ICD-10-CM | POA: Diagnosis present

## 2011-06-08 LAB — CBC
HCT: 27.9 % — ABNORMAL LOW (ref 36.0–46.0)
MCH: 30.5 pg (ref 26.0–34.0)
MCHC: 33.8 g/dL (ref 30.0–36.0)
MCV: 90.3 fL (ref 78.0–100.0)
MCV: 89.7 fL (ref 78.0–100.0)
Platelets: 70 10*3/uL — ABNORMAL LOW (ref 150–400)
RBC: 3.21 MIL/uL — ABNORMAL LOW (ref 3.87–5.11)
RBC: 3.11 MIL/uL — ABNORMAL LOW (ref 3.87–5.11)
RDW: 19 % — ABNORMAL HIGH (ref 11.5–15.5)
WBC: 9.6 10*3/uL (ref 4.0–10.5)

## 2011-06-08 LAB — COMPREHENSIVE METABOLIC PANEL
AST: 516 U/L — ABNORMAL HIGH (ref 0–37)
CO2: 27 mEq/L (ref 19–32)
Calcium: 8.5 mg/dL (ref 8.4–10.5)
Creatinine, Ser: 9.29 mg/dL — ABNORMAL HIGH (ref 0.50–1.10)
GFR calc non Af Amer: 4 mL/min — ABNORMAL LOW (ref >90)
Sodium: 131 mEq/L — ABNORMAL LOW (ref 135–145)
Total Protein: 6.7 g/dL (ref 6.0–8.3)

## 2011-06-08 LAB — PREPARE RBC (CROSSMATCH)

## 2011-06-08 MED ORDER — DARBEPOETIN ALFA-POLYSORBATE 200 MCG/0.4ML IJ SOLN
INTRAMUSCULAR | Status: AC
  Administered 2011-06-08: 200 ug via INTRAVENOUS
  Filled 2011-06-08: qty 0.4

## 2011-06-08 MED ORDER — DARBEPOETIN ALFA-POLYSORBATE 200 MCG/0.4ML IJ SOLN
200.0000 ug | Freq: Once | INTRAMUSCULAR | Status: DC
  Administered 2011-06-08: 200 ug via INTRAVENOUS

## 2011-06-08 MED ORDER — PARICALCITOL 5 MCG/ML IV SOLN
1.0000 ug | INTRAVENOUS | Status: DC
  Filled 2011-06-08: qty 0.2

## 2011-06-08 MED ORDER — HYDROMORPHONE HCL PF 1 MG/ML IJ SOLN
INTRAMUSCULAR | Status: AC
  Filled 2011-06-08: qty 1

## 2011-06-08 MED ORDER — METRONIDAZOLE IN NACL 5-0.79 MG/ML-% IV SOLN
500.0000 mg | Freq: Three times a day (TID) | INTRAVENOUS | Status: DC
  Administered 2011-06-08 – 2011-06-11 (×9): 500 mg via INTRAVENOUS
  Filled 2011-06-08 (×12): qty 100

## 2011-06-08 MED ORDER — ZOLPIDEM TARTRATE 5 MG PO TABS
5.0000 mg | ORAL_TABLET | Freq: Once | ORAL | Status: AC
  Administered 2011-06-08: 5 mg via ORAL
  Filled 2011-06-08: qty 1

## 2011-06-08 MED ORDER — PARICALCITOL 5 MCG/ML IV SOLN
INTRAVENOUS | Status: AC
  Administered 2011-06-08: 1 ug via INTRAVENOUS
  Filled 2011-06-08: qty 1

## 2011-06-08 MED ORDER — PANTOPRAZOLE SODIUM 40 MG IV SOLR
40.0000 mg | Freq: Two times a day (BID) | INTRAVENOUS | Status: DC
  Administered 2011-06-08 – 2011-06-10 (×4): 40 mg via INTRAVENOUS
  Filled 2011-06-08 (×5): qty 40

## 2011-06-08 MED ORDER — PARICALCITOL 5 MCG/ML IV SOLN
1.0000 ug | INTRAVENOUS | Status: DC
  Administered 2011-06-08 – 2011-06-10 (×2): 1 ug via INTRAVENOUS
  Filled 2011-06-08: qty 0.2

## 2011-06-08 MED ORDER — CIPROFLOXACIN IN D5W 400 MG/200ML IV SOLN
400.0000 mg | INTRAVENOUS | Status: DC
  Administered 2011-06-08 – 2011-06-11 (×4): 400 mg via INTRAVENOUS
  Filled 2011-06-08 (×4): qty 200

## 2011-06-08 NOTE — Consult Note (Signed)
[facility] Renal Consultation Note  Indication for Consultation:  Management of ESRD/hemodialysis; anemia, hypertension/volume and secondary hyperparathyroidism  HPI: Samantha [name] is a 66 y.o. female with ESRD on HD at the [facility] who presented to the [facility] ED on Tuesday night (06/06/11) with upper abdominal pain and distention, causing shortness of breath, and dark stool and coffee-ground emesis once each over the previous day.  Her Hgb was 9.2 at the time, but after transfer to [facility] yesterday afternoon, her Hgb had fallen to 5.0.  She received two units of PRBCs last night, and her Hgb this morning was 9.8.  At [facility] the patient's grandson indicated that she had been taking Ibuprofen for various pains, including left shoulder pain.  However, abdominal ultrasound indicates mild acute cholecystitis with a 1.2-cm stone lodged at the neck of the GB.  GI is planning EGD, and Surgery consult is pending.  Dialysis Orders: Center: [facility]  on MWF . EDW 69.5 kg   HD Bath 2K/2.25Ca   Time 3hrs 45 mins   Heparin 5000 U.  Access AVG @ LUA    BFR 400 DFR 800    Zemplar 1 mcg IV/HD   Epogen 5400 Units IV/HD   Venofer  0   Past Medical History  Diagnosis Date  . Hypertension   . Renal disorder   . Shoulder pain    Past Surgical History  Procedure Date  . Shoulder surgery    Social History Both parents died when she was very young, and she was raised by her older sister in [place].  Her husband is deceased, and she lives with the older of her two daughters. She never smoked, drank alcohol, or used illicit drugs, but chews tobacco.      Family History Unknown  No Known Allergies Prior to Admission medications   Medication Sig Start Date End Date Taking? Authorizing Provider  aspirin EC 81 MG tablet Take 81 mg by mouth daily.   Yes Historical Provider, MD  calcium acetate (PHOSLO) 667 MG capsule Take 1,334 mg by mouth 3 (three) times daily with meals.   Yes  Historical Provider, MD  HYDROcodone-acetaminophen (NORCO) 5-325 MG per tablet Take 1 tablet by mouth every 6 (six) hours as needed. pain   Yes Historical Provider, MD  levothyroxine (SYNTHROID, LEVOTHROID) 100 MCG tablet Take 100 mcg by mouth daily.   Yes Historical Provider, MD  Multiple Vitamin (MULITIVITAMIN WITH MINERALS) TABS Take 1 tablet by mouth daily.   Yes Historical Provider, MD  traMADol (ULTRAM) 50 MG tablet Take 50 mg by mouth every 6 (six) hours as needed. pain   Yes Historical Provider, MD    I have reviewed the patient's current medications.  Labs:  Results for orders placed during the hospital encounter of 06/06/11 (from the past 48 hour(s))  CBC     Status: Abnormal   Collection Time   06/06/11  7:20 PM      Component Value Range Comment   WBC 7.5  4.0 - 10.5 (K/uL)    RBC 2.94 (*) 3.87 - 5.11 (MIL/uL)    Hemoglobin 9.2 (*) 12.0 - 15.0 (g/dL)    HCT 16.1 (*) 09.6 - 46.0 (%)    MCV 95.2  78.0 - 100.0 (fL)    MCH 31.3  26.0 - 34.0 (pg)    MCHC 32.9  30.0 - 36.0 (g/dL)    RDW 04.5 (*) 40.9 - 15.5 (%)    Platelets 133 (*) 150 - 400 (  K/uL)   COMPREHENSIVE METABOLIC PANEL     Status: Abnormal   Collection Time   06/06/11  7:20 PM      Component Value Range Comment   Sodium 136  135 - 145 (mEq/L)    Potassium 3.6  3.5 - 5.1 (mEq/L)    Chloride 86 (*) 96 - 112 (mEq/L)    CO2 30  19 - 32 (mEq/L)    Glucose, Bld 86  70 - 99 (mg/dL)    BUN 61 (*) 6 - 23 (mg/dL)    Creatinine, Ser 1.61 (*) 0.50 - 1.10 (mg/dL)    Calcium 09.6  8.4 - 10.5 (mg/dL)    Total Protein 7.5  6.0 - 8.3 (g/dL)    Albumin 3.1 (*) 3.5 - 5.2 (g/dL)    AST 045 (*) 0 - 37 (U/L)    ALT 250 (*) 0 - 35 (U/L)    Alkaline Phosphatase 99  39 - 117 (U/L)    Total Bilirubin 0.7  0.3 - 1.2 (mg/dL)    GFR calc non Af Amer 7 (*) >90 (mL/min)    GFR calc Af Amer 8 (*) >90 (mL/min)   APTT     Status: Normal   Collection Time   06/06/11  7:20 PM      Component Value Range Comment   aPTT 35  24 - 37 (seconds)     PROTIME-INR     Status: Abnormal   Collection Time   06/06/11  7:20 PM      Component Value Range Comment   Prothrombin Time 17.4 (*) 11.6 - 15.2 (seconds)    INR 1.40  0.00 - 1.49    TYPE AND SCREEN     Status: Normal   Collection Time   06/06/11  8:30 PM      Component Value Range Comment   ABO/RH(D) A POS      Antibody Screen NEG      Sample Expiration 06/09/2011     ABO/RH     Status: Normal   Collection Time   06/06/11  8:30 PM      Component Value Range Comment   ABO/RH(D) A POS     OCCULT BLOOD, POC DEVICE     Status: Normal   Collection Time   06/06/11  9:52 PM      Component Value Range Comment   Fecal Occult Bld POSITIVE     LIPASE, BLOOD     Status: Abnormal   Collection Time   06/07/11  3:04 AM      Component Value Range Comment   Lipase 120 (*) 11 - 59 (U/L)   HEPATITIS B SURFACE ANTIBODY     Status: Abnormal   Collection Time   06/07/11  3:04 AM      Component Value Range Comment   Hep B S Ab POSITIVE (*) NEGATIVE    HEPATITIS B CORE ANTIBODY, IGM     Status: Normal   Collection Time   06/07/11  3:04 AM      Component Value Range Comment   Hep B C IgM NEGATIVE  NEGATIVE    FERRITIN     Status: Abnormal   Collection Time   06/07/11  3:04 AM      Component Value Range Comment   Ferritin 1887 (*) 10 - 291 (ng/mL) Result confirmed by automatic dilution.  ACETAMINOPHEN LEVEL     Status: Normal   Collection Time   06/07/11  3:04 AM      Component  Value Range Comment   Acetaminophen (Tylenol), Serum <15.0  10 - 30 (ug/mL)   GLUCOSE, CAPILLARY     Status: Normal   Collection Time   06/07/11  5:28 PM      Component Value Range Comment   Glucose-Capillary 99  70 - 99 (mg/dL)   HEMOGLOBIN AND HEMATOCRIT, BLOOD     Status: Abnormal   Collection Time   06/07/11  9:12 PM      Component Value Range Comment   Hemoglobin 5.0 (*) 12.0 - 15.0 (g/dL)    HCT 16.1 (*) 09.6 - 46.0 (%)   PREPARE RBC (CROSSMATCH)     Status: Normal   Collection Time   06/07/11 11:00 PM       Component Value Range Comment   Order Confirmation ORDER PROCESSED BY BLOOD BANK     TYPE AND SCREEN     Status: Normal (Preliminary result)   Collection Time   06/07/11 11:00 PM      Component Value Range Comment   ABO/RH(D) A POS      Antibody Screen NEG      Sample Expiration 06/10/2011      Unit Number [id number]      Blood Component Type RED CELLS,LR      Unit division 00      Status of Unit ISSUED      Transfusion Status OK TO TRANSFUSE      Crossmatch Result Compatible      Unit Number [id number]      Blood Component Type RED CELLS,LR      Unit division 00      Status of Unit ISSUED      Transfusion Status OK TO TRANSFUSE      Crossmatch Result Compatible     ABO/RH     Status: Normal   Collection Time   06/07/11 11:00 PM      Component Value Range Comment   ABO/RH(D) A POS     MRSA PCR SCREENING     Status: Normal   Collection Time   06/07/11 11:32 PM      Component Value Range Comment   MRSA by PCR NEGATIVE  NEGATIVE    CBC     Status: Abnormal   Collection Time   06/08/11  9:16 AM      Component Value Range Comment   WBC 8.8  4.0 - 10.5 (K/uL)    RBC 3.21 (*) 3.87 - 5.11 (MIL/uL)    Hemoglobin 9.8 (*) 12.0 - 15.0 (g/dL)    HCT 21.3 (*) 08.6 - 46.0 (%)    MCV 90.3  78.0 - 100.0 (fL)    MCH 30.5  26.0 - 34.0 (pg)    MCHC 33.8  30.0 - 36.0 (g/dL)    RDW 57.8 (*) 46.9 - 15.5 (%)    Platelets 70 (*) 150 - 400 (K/uL)   COMPREHENSIVE METABOLIC PANEL     Status: Abnormal   Collection Time   06/08/11  9:16 AM      Component Value Range Comment   Sodium 131 (*) 135 - 145 (mEq/L)    Potassium 4.6  3.5 - 5.1 (mEq/L)    Chloride 86 (*) 96 - 112 (mEq/L)    CO2 27  19 - 32 (mEq/L)    Glucose, Bld 98  70 - 99 (mg/dL)    BUN 98 (*) 6 - 23 (mg/dL)    Creatinine, Ser 6.29 (*) 0.50 - 1.10 (mg/dL)    Calcium 8.5  8.4 -  10.5 (mg/dL)    Total Protein 6.7  6.0 - 8.3 (g/dL)    Albumin 2.5 (*) 3.5 - 5.2 (g/dL)    AST 710 (*) 0 - 37 (U/L)    ALT 365 (*) 0 - 35 (U/L)    Alkaline  Phosphatase 94  39 - 117 (U/L)    Total Bilirubin 1.2  0.3 - 1.2 (mg/dL)    GFR calc non Af Amer 4 (*) >90 (mL/min)    GFR calc Af Amer 4 (*) >90 (mL/min)   LIPASE, BLOOD     Status: Abnormal   Collection Time   06/08/11  9:16 AM      Component Value Range Comment   Lipase 117 (*) 11 - 59 (U/L)   PHOSPHORUS     Status: Abnormal   Collection Time   06/08/11  9:16 AM      Component Value Range Comment   Phosphorus 6.9 (*) 2.3 - 4.6 (mg/dL)    Constitutional: negative for anorexia, chills, fatigue and fevers Respiratory: positive for mild dyspnea Cardiovascular: negative Gastrointestinal: positive for abdominal pain, melena, nausea and vomiting, distention Genitourinary:oliguric Musculoskeletal:negative Neurological: negative  Physical Exam: Filed Vitals:   06/08/11 1000  BP: 89/48  Pulse: 80  Temp:   Resp: 16     General appearance: alert, cooperative and no distress Head: Normocephalic, without obvious abnormality, atraumatic Throat: oral mucosa pink and moist Neck: no adenopathy, no carotid bruit, no JVD and supple, symmetrical, trachea midline Resp: diminished breath sounds bilaterally Cardio: regular rate and rhythm, S1, S2 normal, no murmur, click, rub or gallop GI: + BS, softwith mild distention, epigastric and RUQ tenderness Extremities: mild non-pitting edema Neurologic: Grossly normal Dialysis Access: AVG @ LUA   Assessment/Plan: 1.  GI bleeding - coffee-ground emesis and melena, Hgb dropped from 9.2 to 5, now 9.8 s/p 2 U of PRBCs; unknown source.  GI following, EGD pending. 2.  Abdominal pain - abdominal US with mild acute cholecystitis with a 1.2-cm stone lodged at the neck of the GB, on Dilaudid.  Surgery consult pending. 3.  ESRD -  On HD on MWF at [facility], missed HD yesterday while at [facility]; K currently 4.6.  HD pending today. 4.  Hypertension/volume  - BP low (80s -90s), but 5 L over EDW of 69.5. 5.  Anemia  - Hgb improved to 9.8 s/p transfusion today,  secondary to GI bleed.  Will give Aranesp 200 mg at HD. 6.  Metabolic bone disease -  On Zemplar 1 mcg per HD, Ca 10, last P 3.1 on 1/23; on Phoslo 2 with meals as outpatient. 7.  Nutrition - last Alb 3.4 on 1/23.   LYLES,CHARLES 06/08/2011, 11:27 AM   Attending Nephrologist: Marina Gravel, MD  Mrs. Akridge was admitted for abd pain.  She had hgb of 5.  She received PRBC and hgb now is 9.8.  Not sure why hgb has gone down.  GB  US show gall stone and thickened GB wall.  She is going today for dialysis today.  I've asked surg to see pt in case cholecystectomy is needed.

## 2011-06-08 NOTE — Progress Notes (Signed)
[facility]   Subjective: Alert. Endorses is hungry and thirsty and would like orange juice. Also complaining of right lateral upper quadrant abdominal pain. States had black stool as well as black emesis last week and states no red stool or red emesis at all. Denies nausea or vomiting at the present time. No chest pain or shortness of breath endorsed.  Objective: Vital signs in last 24 hours: Temp:  [97.5 F (36.4 C)-100.3 F (37.9 C)] 97.5 F (36.4 C) (02/21 0700) Pulse Rate:  [78-102] 78  (02/21 1200) Resp:  [12-20] 18  (02/21 1200) BP: (80-103)/(25-57) 94/46 mmHg (02/21 1200) SpO2:  [97 %-100 %] 100 % (02/21 1200) Weight:  [72.7 kg (160 lb 4.4 oz)-74.5 kg (164 lb 3.9 oz)] 74.5 kg (164 lb 3.9 oz) (02/21 0008) Weight change: 9.696 kg (21 lb 6 oz) Last BM Date: 06/07/11  Intake/Output from previous day: 02/20 0701 - 02/21 0700 In: 650 [I.V.:25; Blood:625] Out: -  Intake/Output this shift: Total I/O In: 143 [I.V.:103; Other:40] Out: -   General appearance: alert, cooperative, appears older than stated age and mild distress Resp: clear to auscultation bilaterally, on room air attending saturations 100% Cardio: regular rate and normal sinus rhythm, S1, S2 normal, no murmur, click, rub or gallop GI: soft, non-tender; bowel sounds normal; no masses,  no organomegaly Extremities: extremities normal, atraumatic, no cyanosis or edema Neurologic: Grossly normal, ambulatory in room with assistance of nursing staff  Lab Results:  [facility] 06/08/11 0916 06/07/11 2112 06/06/11 1920  WBC 8.8 -- 7.5  HGB 9.8* 5.0* --  HCT 29.0* 15.5* --  PLT 70* -- 133*   BMET  Basename 06/08/11 0916 06/06/11 1920  NA 131* 136  K 4.6 3.6  CL 86* 86*  CO2 27 30  GLUCOSE 98 86  BUN 98* 61*  CREATININE 9.29* 6.02*  CALCIUM 8.5 10.0    Studies/Results: US Abdomen Complete  06/07/2011  *RADIOLOGY REPORT*  Clinical Data:  Abdominal pain; elevated LFTs.  ABDOMINAL ULTRASOUND COMPLETE   Comparison:  Abdominal radiograph performed earlier today at 08:24 p.m.  Findings:  Gallbladder:  There appears to be a 1.2 cm stone lodged at the neck of the gallbladder.  The gallbladder wall is mildly thickened and edematous, with suggestion of trace associated pericholecystic fluid.  A positive ultrasonographic Murphy's sign is seen. Findings raise concern for mild acute cholecystitis.  Common Bile Duct:  0.5 cm in diameter; within normal limits in caliber.  Liver:  Heterogeneous echotexture, with a mildly nodular contour, raising question for mild cirrhotic change; no focal lesions identified.  Limited Doppler evaluation demonstrates normal blood flow within the liver.  IVC:  Unremarkable in appearance.  Pancreas:  Although the pancreas is difficult to visualize in its entirety due to overlying bowel gas, no focal pancreatic abnormality is identified.  Spleen:  10.2 cm in length; within normal limits in size and echotexture.  Right kidney:  5.7 cm in length; markedly diminutive, with diffusely increased cortical echogenicity, reflecting the patient's chronic renal atrophy.  A few small cysts are suggested.  No suspicious mass seen; no evidence of hydronephrosis.  Left kidney:  5.5 cm in length; markedly diminutive, with diffusely increased cortical echogenicity, reflecting the patient's chronic renal atrophy.  A few small cysts are noted.  No suspicious mass seen; no evidence of hydronephrosis.  Abdominal Aorta:  Normal in caliber; no aneurysm identified.  The aortic bifurcation is not visualized due to overlying bowel gas.  IMPRESSION:  1.  Suspect mild acute cholecystitis,  with a 1.2 cm stone lodged at the neck of the gallbladder, and mild gallbladder wall thickening and edema, with suggestion of trace pericholecystic fluid. Positive ultrasonographic Murphy's sign elicited.  No evidence of biliary duct dilatation to suggest more distal obstruction. 2.  Mildly nodular contour of the liver raises question for  mild cirrhotic change. 3.  Chronic bilateral renal atrophy noted, with few small scattered cysts seen.  Original Report Authenticated By: Tonia Ghent, M.D.   Dg Abd Acute W/chest  06/06/2011  *RADIOLOGY REPORT*  Clinical Data: 66 year old female with abdominal pain, shortness of breath, nausea and vomiting.  ACUTE ABDOMEN SERIES (ABDOMEN 2 VIEW & CHEST 1 VIEW)  Comparison: None.  Findings: Postoperative changes to the right glenohumeral joint. Cardiac size and mediastinal contours are within normal limits. Visualized tracheal air column is within normal limits.  No pneumothorax or pneumoperitoneum.  No pulmonary edema, pleural effusion or consolidation.  Nonobstructed bowel gas pattern. No acute osseous abnormality identified.  IMPRESSION: Nonobstructed bowel gas pattern, no free air. No acute cardiopulmonary abnormality.  Original Report Authenticated By: Harley Hallmark, M.D.    Medications:  I have reviewed the patient's current medications. Scheduled:   . calcium acetate  1,334 mg Oral TID WC  . levothyroxine  100 mcg Oral BH-q7a  . paricalcitol  1 mcg Intravenous 3 times weekly  . sodium chloride  3 mL Intravenous Q12H    Assessment/Plan:  Principal Problem:  *GI bleed *Seems chronic and likely from an upper source *Has not had any melena or any frank red blood since admission *Dr. Loreta Ave with GI has been notified and will see the patient today *Continue Protonix infusion  Active Problems:  Anemia in chronic kidney disease (CKD)/Acute blood loss anemia *Baseline hemoglobin unknown *Hemoglobin at admission 9.2 and within 24 hours had dropped dramatically to 5.0-after transfusion with 2 units of packed red blood cells hemoglobin now up to 9.8 *Suspect the reading of 5 was spurious given the fact the patient is not with signs of acute frank red blood loss *Follow CBC and followup on GI evaluation and recommendations   RUQ abdominal tenderness/Transaminitis/Cholelithiasis and  cholecystitis with obstruction *Primary reason for admission was abdominal pain which persists in the right upper quadrant *Abdominal ultrasound performed this admission consistent with mild acute cholecystitis with a 1.2 cm stone lodged at the neck of the gallbladder with mild gallbladder wall thickening and trace pericholecystic fluid as well as a positive sonographic Murphy sign. No biliary ductal dilatation to suggest more distal obstruction *Patient has transaminitis which has worsened (although total bilirubin is normal) which is consistent with findings on ultrasound *Suspect she has biliary colic with mild cholecystitis and therefore have requested surgical consultation-they have recommended getting empiric and about coverage so we will start ciprofloxacin and Flagyl. *Also has mild nodular contour of the liver which is concerning for possible mild cirrhotic changes   ESRD on hemodialysis *Normal dialysis days Tuesday Thursday and Saturday *Neurology following during the hospitalization   HTN (hypertension) *Systolic blood pressure marginal with readings as low as 80 most recent 94 *Not currently on antihypertensive medications and suspect hypertension has been managed primarily with hemodialysis and volume removal   Hepatitis B surface antigen positive *Concerning for possible acute exposure to HBV *Hepatitis B C. IgM is negative which could be suggestive of early infection and may not be detectable after acute infection for several weeks *This could also explain the patient's right upper quadrant abdominal pain and transaminitis or could be occurring concomitantly  with acute cholecystitis given the other findings on ultrasound  Hypothyroidism *Continue home dose Synthroid *Consider checking TSH this admission   Disposition *Remain in step down unit an additional 24 hours to ensure she is not actively bleeding     LOS: 2 days   Junious Silk, ANP pager [phone]  [facility] [email or web address] Password: [email or web address]  06/08/2011, 12:24 PM  I have examined the patient and reviewed the chart. I have discussed the plan with Susa Griffins, NP and I agree with the above note.   Calvert Cantor, MD [phone]

## 2011-06-08 NOTE — Progress Notes (Signed)
Pt taken for HD at 1230hrs.

## 2011-06-08 NOTE — Progress Notes (Signed)
Pt returned from HD.  VSS on arrival.  Pt to receive flaygyl and cipro which are not compatible with protonix gtt.  Dr. Butler Denmark paged to see if she wants protonix stopped from antibiotic infusion.  IV team attempted to place 2nd IV earlier and was unable to place 2nd IV.

## 2011-06-08 NOTE — Consult Note (Signed)
Reason for Consult:Cholectytitis Referring Physician: [name] is an 66 y.o. female.  HPI: Patient is a 66 year old female with history of end-stage renal dialysis, on hemodialysis Monday Wednesdays and Fridays. She is extremely hard of hearing, very difficult to get a history from her. She presented to the ER at [facility], 06/06/2011, with nausea, vomiting, coffee ground emesis, and Guiac positive stools. Hemoglobin and hematocrit to 2/19 at 9 PM = 5/15. She was admitted that point has been transfuse with 2 units of packed cells.  There is some hx of NSAID use.She continued to complain of right upper quadrant abdominal pain. Initial acute abdominal series on 06/06/11 showed a nonobstructive bowel pattern with no free air, no pulmonary edema effusion, or consolidation. Abdominal ultrasound done the a.m. of 06/07/2011, showed a 1.2 cm stone lodged in the neck of the gallbladder. Gallbladder wall was mildly thickened and edematous, is also some possible pericholecystic fluid. Murphy's sign was positive on ultrasound. Common bile duct was 0.5 cm and within normal limits. This was suggestive of mild acute cholecystitis. There is some mild nodular contour liver with a question for mild sclerotic changes, bilateral renal atrophy was noted along with a few scattered small cyst. We're [place] the patient in consultation. Note showed that the patient is going to be seen and evaluated by Dr.  Loreta Ave the GI service and scheduled for EGD.  Past Medical History  Diagnosis Date  . Hypertension   . ESRD on Hemodialysis M-W-F   . Shoulder pain Right side done she says she needs it done on Left also.   Hypothyroid  Past Surgical History  Procedure Date  . Shoulder surgery     History reviewed. No pertinent family history.  Social History:  reports that she has never smoked. Her smokeless tobacco use includes Chew. She reports that she does not drink alcohol or use illicit drugs.  Allergies: No Known  Allergies  Medications:  Prior to Admission:  Prescriptions prior to admission  Medication Sig Dispense Refill  . aspirin EC 81 MG tablet Take 81 mg by mouth daily.      . calcium acetate (PHOSLO) 667 MG capsule Take 1,334 mg by mouth 3 (three) times daily with meals.      Marland Kitchen HYDROcodone-acetaminophen (NORCO) 5-325 MG per tablet Take 1 tablet by mouth every 6 (six) hours as needed. pain      . levothyroxine (SYNTHROID, LEVOTHROID) 100 MCG tablet Take 100 mcg by mouth daily.      . Multiple Vitamin (MULITIVITAMIN WITH MINERALS) TABS Take 1 tablet by mouth daily.      . traMADol (ULTRAM) 50 MG tablet Take 50 mg by mouth every 6 (six) hours as needed. pain       Scheduled:   . calcium acetate  1,334 mg Oral TID WC  . levothyroxine  100 mcg Oral BH-q7a  . paricalcitol  1 mcg Intravenous 3 times weekly  . sodium chloride  3 mL Intravenous Q12H   Continuous:   . pantoprozole (PROTONIX) infusion 8 mg/hr (06/07/11 2137)  . DISCONTD: sodium chloride 1,000 mL (06/07/11 1749)   ZOX:[id number], DISCONTD: HYDROmorphone Anti-infectives    None      Results for orders placed during the hospital encounter of 06/06/11 (from the past 48 hour(s))  CBC     Status: Abnormal   Collection Time   06/06/11  7:20 PM      Component Value Range Comment   WBC 7.5  4.0 - 10.5 (K/uL)  RBC 2.94 (*) 3.87 - 5.11 (MIL/uL)    Hemoglobin 9.2 (*) 12.0 - 15.0 (g/dL)    HCT 16.1 (*) 09.6 - 46.0 (%)    MCV 95.2  78.0 - 100.0 (fL)    MCH 31.3  26.0 - 34.0 (pg)    MCHC 32.9  30.0 - 36.0 (g/dL)    RDW 04.5 (*) 40.9 - 15.5 (%)    Platelets 133 (*) 150 - 400 (K/uL)   COMPREHENSIVE METABOLIC PANEL     Status: Abnormal   Collection Time   06/06/11  7:20 PM      Component Value Range Comment   Sodium 136  135 - 145 (mEq/L)    Potassium 3.6  3.5 - 5.1 (mEq/L)    Chloride 86 (*) 96 - 112 (mEq/L)    CO2 30  19 - 32 (mEq/L)    Glucose, Bld 86  70 - 99 (mg/dL)    BUN 61 (*) 6 - 23 (mg/dL)    Creatinine, Ser 8.11  (*) 0.50 - 1.10 (mg/dL)    Calcium 91.4  8.4 - 10.5 (mg/dL)    Total Protein 7.5  6.0 - 8.3 (g/dL)    Albumin 3.1 (*) 3.5 - 5.2 (g/dL)    AST 782 (*) 0 - 37 (U/L)    ALT 250 (*) 0 - 35 (U/L)    Alkaline Phosphatase 99  39 - 117 (U/L)    Total Bilirubin 0.7  0.3 - 1.2 (mg/dL)    GFR calc non Af Amer 7 (*) >90 (mL/min)    GFR calc Af Amer 8 (*) >90 (mL/min)   APTT     Status: Normal   Collection Time   06/06/11  7:20 PM      Component Value Range Comment   aPTT 35  24 - 37 (seconds)   PROTIME-INR     Status: Abnormal   Collection Time   06/06/11  7:20 PM      Component Value Range Comment   Prothrombin Time 17.4 (*) 11.6 - 15.2 (seconds)    INR 1.40  0.00 - 1.49    TYPE AND SCREEN     Status: Normal   Collection Time   06/06/11  8:30 PM      Component Value Range Comment   ABO/RH(D) A POS      Antibody Screen NEG      Sample Expiration 06/09/2011     ABO/RH     Status: Normal   Collection Time   06/06/11  8:30 PM      Component Value Range Comment   ABO/RH(D) A POS     OCCULT BLOOD, POC DEVICE     Status: Normal   Collection Time   06/06/11  9:52 PM      Component Value Range Comment   Fecal Occult Bld POSITIVE     LIPASE, BLOOD     Status: Abnormal   Collection Time   06/07/11  3:04 AM      Component Value Range Comment   Lipase 120 (*) 11 - 59 (U/L)   HEPATITIS B SURFACE ANTIBODY     Status: Abnormal   Collection Time   06/07/11  3:04 AM      Component Value Range Comment   Hep B S Ab POSITIVE (*) NEGATIVE    HEPATITIS B CORE ANTIBODY, IGM     Status: Normal   Collection Time   06/07/11  3:04 AM      Component Value Range Comment  Hep B C IgM NEGATIVE  NEGATIVE    FERRITIN     Status: Abnormal   Collection Time   06/07/11  3:04 AM      Component Value Range Comment   Ferritin 1887 (*) 10 - 291 (ng/mL) Result confirmed by automatic dilution.  ACETAMINOPHEN LEVEL     Status: Normal   Collection Time   06/07/11  3:04 AM      Component Value Range Comment    Acetaminophen (Tylenol), Serum <15.0  10 - 30 (ug/mL)   GLUCOSE, CAPILLARY     Status: Normal   Collection Time   06/07/11  5:28 PM      Component Value Range Comment   Glucose-Capillary 99  70 - 99 (mg/dL)   HEMOGLOBIN AND HEMATOCRIT, BLOOD     Status: Abnormal   Collection Time   06/07/11  9:12 PM      Component Value Range Comment   Hemoglobin 5.0 (*) 12.0 - 15.0 (g/dL)    HCT 16.1 (*) 09.6 - 46.0 (%)   PREPARE RBC (CROSSMATCH)     Status: Normal   Collection Time   06/07/11 11:00 PM      Component Value Range Comment   Order Confirmation ORDER PROCESSED BY BLOOD BANK     TYPE AND SCREEN     Status: Normal (Preliminary result)   Collection Time   06/07/11 11:00 PM      Component Value Range Comment   ABO/RH(D) A POS      Antibody Screen NEG      Sample Expiration 06/10/2011      Unit Number [id number]      Blood Component Type RED CELLS,LR      Unit division 00      Status of Unit ISSUED      Transfusion Status OK TO TRANSFUSE      Crossmatch Result Compatible      Unit Number [id number]      Blood Component Type RED CELLS,LR      Unit division 00      Status of Unit ISSUED      Transfusion Status OK TO TRANSFUSE      Crossmatch Result Compatible     ABO/RH     Status: Normal   Collection Time   06/07/11 11:00 PM      Component Value Range Comment   ABO/RH(D) A POS     MRSA PCR SCREENING     Status: Normal   Collection Time   06/07/11 11:32 PM      Component Value Range Comment   MRSA by PCR NEGATIVE  NEGATIVE    CBC     Status: Abnormal   Collection Time   06/08/11  9:16 AM      Component Value Range Comment   WBC 8.8  4.0 - 10.5 (K/uL)    RBC 3.21 (*) 3.87 - 5.11 (MIL/uL)    Hemoglobin 9.8 (*) 12.0 - 15.0 (g/dL)    HCT 21.3 (*) 08.6 - 46.0 (%)    MCV 90.3  78.0 - 100.0 (fL)    MCH 30.5  26.0 - 34.0 (pg)    MCHC 33.8  30.0 - 36.0 (g/dL)    RDW 57.8 (*) 46.9 - 15.5 (%)    Platelets 70 (*) 150 - 400 (K/uL)   COMPREHENSIVE METABOLIC PANEL     Status: Abnormal    Collection Time   06/08/11  9:16 AM      Component Value Range Comment  Sodium 131 (*) 135 - 145 (mEq/L)    Potassium 4.6  3.5 - 5.1 (mEq/L)    Chloride 86 (*) 96 - 112 (mEq/L)    CO2 27  19 - 32 (mEq/L)    Glucose, Bld 98  70 - 99 (mg/dL)    BUN 98 (*) 6 - 23 (mg/dL)    Creatinine, Ser 1.61 (*) 0.50 - 1.10 (mg/dL)    Calcium 8.5  8.4 - 10.5 (mg/dL)    Total Protein 6.7  6.0 - 8.3 (g/dL)    Albumin 2.5 (*) 3.5 - 5.2 (g/dL)    AST 096 (*) 0 - 37 (U/L)    ALT 365 (*) 0 - 35 (U/L)    Alkaline Phosphatase 94  39 - 117 (U/L)    Total Bilirubin 1.2  0.3 - 1.2 (mg/dL)    GFR calc non Af Amer 4 (*) >90 (mL/min)    GFR calc Af Amer 4 (*) >90 (mL/min)   LIPASE, BLOOD     Status: Abnormal   Collection Time   06/08/11  9:16 AM      Component Value Range Comment   Lipase 117 (*) 11 - 59 (U/L)   PHOSPHORUS     Status: Abnormal   Collection Time   06/08/11  9:16 AM      Component Value Range Comment   Phosphorus 6.9 (*) 2.3 - 4.6 (mg/dL)     US Abdomen Complete  06/07/2011  *RADIOLOGY REPORT*  Clinical Data:  Abdominal pain; elevated LFTs.  ABDOMINAL ULTRASOUND COMPLETE  Comparison:  Abdominal radiograph performed earlier today at 08:24 p.m.  Findings:  Gallbladder:  There appears to be a 1.2 cm stone lodged at the neck of the gallbladder.  The gallbladder wall is mildly thickened and edematous, with suggestion of trace associated pericholecystic fluid.  A positive ultrasonographic Murphy's sign is seen. Findings raise concern for mild acute cholecystitis.  Common Bile Duct:  0.5 cm in diameter; within normal limits in caliber.  Liver:  Heterogeneous echotexture, with a mildly nodular contour, raising question for mild cirrhotic change; no focal lesions identified.  Limited Doppler evaluation demonstrates normal blood flow within the liver.  IVC:  Unremarkable in appearance.  Pancreas:  Although the pancreas is difficult to visualize in its entirety due to overlying bowel gas, no focal pancreatic  abnormality is identified.  Spleen:  10.2 cm in length; within normal limits in size and echotexture.  Right kidney:  5.7 cm in length; markedly diminutive, with diffusely increased cortical echogenicity, reflecting the patient's chronic renal atrophy.  A few small cysts are suggested.  No suspicious mass seen; no evidence of hydronephrosis.  Left kidney:  5.5 cm in length; markedly diminutive, with diffusely increased cortical echogenicity, reflecting the patient's chronic renal atrophy.  A few small cysts are noted.  No suspicious mass seen; no evidence of hydronephrosis.  Abdominal Aorta:  Normal in caliber; no aneurysm identified.  The aortic bifurcation is not visualized due to overlying bowel gas.  IMPRESSION:  1.  Suspect mild acute cholecystitis, with a 1.2 cm stone lodged at the neck of the gallbladder, and mild gallbladder wall thickening and edema, with suggestion of trace pericholecystic fluid. Positive ultrasonographic Murphy's sign elicited.  No evidence of biliary duct dilatation to suggest more distal obstruction. 2.  Mildly nodular contour of the liver raises question for mild cirrhotic change. 3.  Chronic bilateral renal atrophy noted, with few small scattered cysts seen.  Original Report Authenticated By: Tonia Ghent, M.D.  Dg Abd Acute W/chest  06/06/2011  *RADIOLOGY REPORT*  Clinical Data: 66 year old female with abdominal pain, shortness of breath, nausea and vomiting.  ACUTE ABDOMEN SERIES (ABDOMEN 2 VIEW & CHEST 1 VIEW)  Comparison: None.  Findings: Postoperative changes to the right glenohumeral joint. Cardiac size and mediastinal contours are within normal limits. Visualized tracheal air column is within normal limits.  No pneumothorax or pneumoperitoneum.  No pulmonary edema, pleural effusion or consolidation.  Nonobstructed bowel gas pattern. No acute osseous abnormality identified.  IMPRESSION: Nonobstructed bowel gas pattern, no free air. No acute cardiopulmonary abnormality.   Original Report Authenticated By: Harley Hallmark, M.D.    Review of Systems  Constitutional: Negative for fever, chills, weight loss and malaise/fatigue.       Pt is extremely hard of hearing and difficulty to get history or ROS  HENT: Negative.   Eyes: Negative.   Respiratory: Positive for shortness of breath. Negative for hemoptysis, sputum production and wheezing.   Cardiovascular: Negative.   Gastrointestinal: Positive for nausea, vomiting, blood in stool and melena.       Dark stools for about a week  Genitourinary: Negative.   Musculoskeletal:       Shoulder pain, hx or surgery on Right, needs left done.  Skin: Negative.   Neurological: Negative.   Endo/Heme/Allergies: Negative.    Blood pressure 94/46, pulse 78, temperature 99 F (37.2 C), temperature source Oral, resp. rate 18, height 5\' 4"  (1.626 m), weight 74.5 kg (164 lb 3.9 oz), SpO2 100.00%. Physical Exam  Constitutional: She is oriented to person, place, and time. She appears well-developed and well-nourished.  HENT:  Head: Normocephalic and atraumatic.  Nose: Nose normal.       Very hard of hearing   Eyes: Conjunctivae and EOM are normal. Pupils are equal, round, and reactive to light. Left eye exhibits no discharge.  Neck: Normal range of motion. Neck supple. No JVD present. No tracheal deviation present. No thyromegaly present.  Cardiovascular: Normal rate, regular rhythm, normal heart sounds and intact distal pulses.   No murmur heard. Respiratory: Effort normal and breath sounds normal. No respiratory distress. She has no wheezes. She has no rales. She exhibits no tenderness.  GI: Soft. Bowel sounds are normal. She exhibits no distension and no mass. There is tenderness (RUQ she is very tender.). There is no rebound and no guarding.  Musculoskeletal: She exhibits no edema.  Lymphadenopathy:    She has no cervical adenopathy.  Neurological: She is alert and oriented to person, place, and time. She has normal  reflexes. No cranial nerve deficit.  Skin: Skin is warm and dry.  Psychiatric: She has a normal mood and affect. Her behavior is normal. Judgment and thought content normal.    Assessment/Plan: 1.Cholecystitis with cholelithiasis; elevated LFT's 2.GI bleed, transfused with 2 units PRBC'S,  EGD/ Dr. Loreta Ave to see. 3.ESRD, on HD,  M,W,F. 4.Hypertension 5.Hepatitis B  6.L shoulder pain 7.Thrombocytopenia  Plan:  For HD today, EGD tomorrow, I would start on antibiotics, she's already on PPI. Treat pain with increased analgesics also.  JENNINGS,WILLARD 06/08/2011, 12:50 PM   She likely has cholecystitis but given her current hx of what sounds like UGI bleeding, this could also be due to duodenal ulcer or PUD.  WBC is normal but she does have gallstones and RUQ pain.  I would check HIDA and f/u results of EGD.  If she has an ulcer on EGD and HIDA negative, then I would think that her symptoms are due  to the ulcer and the pericholecystic fluid could be reactive.  We would hold off on surgery. She may have concurrent ulcer and cholecystitis as well but it would be best if we could avoid a surgery on this dialysis patient

## 2011-06-08 NOTE — Progress Notes (Signed)
ANTIBIOTIC CONSULT NOTE - INITIAL  Pharmacy Consult for cipro Indication: cholecystitis  No Known Allergies  Patient Measurements: Height: 5\' 4"  (162.6 cm) Weight: 164 lb 3.9 oz (74.5 kg) IBW/kg (Calculated) : 54.7   Vital Signs: Temp: 99 F (37.2 C) (02/21 1304) Temp src: Oral (02/21 1304) BP: 106/51 mmHg (02/21 1400) Pulse Rate: 80  (02/21 1400) Intake/Output from previous day: 02/20 0701 - 02/21 0700 In: 650 [I.V.:25; Blood:625] Out: -  Intake/Output from this shift: Total I/O In: 143 [I.V.:103; Other:40] Out: -   Labs:  Basename 06/08/11 0916 06/07/11 2112 06/06/11 1920  WBC 8.8 -- 7.5  HGB 9.8* 5.0* 9.2*  PLT 70* -- 133*  LABCREA -- -- --  CREATININE 9.29* -- 6.02*   Estimated Creatinine Clearance: 5.8 ml/min (by C-G formula based on Cr of 9.29). No results found for this basename: VANCOTROUGH:2,VANCOPEAK:2,VANCORANDOM:2,GENTTROUGH:2,GENTPEAK:2,GENTRANDOM:2,TOBRATROUGH:2,TOBRAPEAK:2,TOBRARND:2,AMIKACINPEAK:2,AMIKACINTROU:2,AMIKACIN:2, in the last 72 hours   Microbiology: Recent Results (from the past 720 hour(s))  MRSA PCR SCREENING     Status: Normal   Collection Time   06/07/11 11:32 PM      Component Value Range Status Comment   MRSA by PCR NEGATIVE  NEGATIVE  Final     Medical History: Past Medical History  Diagnosis Date  . Hypertension   . Renal disorder   . Shoulder pain    Pt admitted for cholecytitis. H/o ESRD. Cipro and flagyl has been ordered empirically. Since she is ESRD no further dosage adjustment is needed.   Plan:  Cipro 400mg  IV q24 RX to sign off

## 2011-06-08 NOTE — Consult Note (Signed)
Reason for Consult: Anemia Referring Physician: [facility]  Samantha [name] HPI: This is a 66 year old female who is noted to have an anemia and a reported history of coffee-ground emesis and diffuse abdominal pain.  There also a report of melena, however, she reports using Pepto-Bismol.  Further evaluation for her abdominal pain revealed that she has a mild acute cholecystitis   Past Medical History  Diagnosis Date  . Hypertension   . Renal disorder   . Shoulder pain     Past Surgical History  Procedure Date  . Shoulder surgery     History reviewed. No pertinent family history.  Social History:  reports that she has never smoked. Her smokeless tobacco use includes Chew. She reports that she does not drink alcohol or use illicit drugs.  Allergies: No Known Allergies  Medications:  Scheduled:   . calcium acetate  1,334 mg Oral TID WC  . ciprofloxacin  400 mg Intravenous Q24H  . levothyroxine  100 mcg Oral BH-q7a  . metronidazole  500 mg Intravenous Q8H  . pantoprazole (PROTONIX) IV  40 mg Intravenous Q12H  . paricalcitol  1 mcg Intravenous 3 times weekly  . sodium chloride  3 mL Intravenous Q12H  . DISCONTD: darbepoetin (ARANESP) injection - DIALYSIS  200 mcg Intravenous Once  . DISCONTD: paricalcitol  1 mcg Intravenous 3 times weekly   Continuous:   . DISCONTD: sodium chloride 1,000 mL (06/07/11 1749)  . DISCONTD: pantoprozole (PROTONIX) infusion Stopped (06/08/11 1801)    Results for orders placed during the hospital encounter of 06/06/11 (from the past 24 hour(s))  GLUCOSE, CAPILLARY     Status: Normal   Collection Time   06/07/11  5:28 PM      Component Value Range   Glucose-Capillary 99  70 - 99 (mg/dL)  HEMOGLOBIN AND HEMATOCRIT, BLOOD     Status: Abnormal   Collection Time   06/07/11  9:12 PM      Component Value Range   Hemoglobin 5.0 (*) 12.0 - 15.0 (g/dL)   HCT 40.9 (*) 81.1 - 46.0 (%)  PREPARE RBC (CROSSMATCH)     Status: Normal   Collection  Time   06/07/11 11:00 PM      Component Value Range   Order Confirmation ORDER PROCESSED BY BLOOD BANK    TYPE AND SCREEN     Status: Normal (Preliminary result)   Collection Time   06/07/11 11:00 PM      Component Value Range   ABO/RH(D) A POS     Antibody Screen NEG     Sample Expiration 06/10/2011     Unit Number [id number]     Blood Component Type RED CELLS,LR     Unit division 00     Status of Unit ISSUED     Transfusion Status OK TO TRANSFUSE     Crossmatch Result Compatible     Unit Number [id number]     Blood Component Type RED CELLS,LR     Unit division 00     Status of Unit ISSUED     Transfusion Status OK TO TRANSFUSE     Crossmatch Result Compatible    ABO/RH     Status: Normal   Collection Time   06/07/11 11:00 PM      Component Value Range   ABO/RH(D) A POS    MRSA PCR SCREENING     Status: Normal   Collection Time   06/07/11 11:32 PM      Component Value Range  MRSA by PCR NEGATIVE  NEGATIVE   CBC     Status: Abnormal   Collection Time   06/08/11  9:16 AM      Component Value Range   WBC 8.8  4.0 - 10.5 (K/uL)   RBC 3.21 (*) 3.87 - 5.11 (MIL/uL)   Hemoglobin 9.8 (*) 12.0 - 15.0 (g/dL)   HCT 16.1 (*) 09.6 - 46.0 (%)   MCV 90.3  78.0 - 100.0 (fL)   MCH 30.5  26.0 - 34.0 (pg)   MCHC 33.8  30.0 - 36.0 (g/dL)   RDW 04.5 (*) 40.9 - 15.5 (%)   Platelets 70 (*) 150 - 400 (K/uL)  COMPREHENSIVE METABOLIC PANEL     Status: Abnormal   Collection Time   06/08/11  9:16 AM      Component Value Range   Sodium 131 (*) 135 - 145 (mEq/L)   Potassium 4.6  3.5 - 5.1 (mEq/L)   Chloride 86 (*) 96 - 112 (mEq/L)   CO2 27  19 - 32 (mEq/L)   Glucose, Bld 98  70 - 99 (mg/dL)   BUN 98 (*) 6 - 23 (mg/dL)   Creatinine, Ser 8.11 (*) 0.50 - 1.10 (mg/dL)   Calcium 8.5  8.4 - 91.4 (mg/dL)   Total Protein 6.7  6.0 - 8.3 (g/dL)   Albumin 2.5 (*) 3.5 - 5.2 (g/dL)   AST 782 (*) 0 - 37 (U/L)   ALT 365 (*) 0 - 35 (U/L)   Alkaline Phosphatase 94  39 - 117 (U/L)   Total Bilirubin 1.2  0.3  - 1.2 (mg/dL)   GFR calc non Af Amer 4 (*) >90 (mL/min)   GFR calc Af Amer 4 (*) >90 (mL/min)  LIPASE, BLOOD     Status: Abnormal   Collection Time   06/08/11  9:16 AM      Component Value Range   Lipase 117 (*) 11 - 59 (U/L)  PHOSPHORUS     Status: Abnormal   Collection Time   06/08/11  9:16 AM      Component Value Range   Phosphorus 6.9 (*) 2.3 - 4.6 (mg/dL)     US Abdomen Complete  06/07/2011  *RADIOLOGY REPORT*  Clinical Data:  Abdominal pain; elevated LFTs.  ABDOMINAL ULTRASOUND COMPLETE  Comparison:  Abdominal radiograph performed earlier today at 08:24 p.m.  Findings:  Gallbladder:  There appears to be a 1.2 cm stone lodged at the neck of the gallbladder.  The gallbladder wall is mildly thickened and edematous, with suggestion of trace associated pericholecystic fluid.  A positive ultrasonographic Murphy's sign is seen. Findings raise concern for mild acute cholecystitis.  Common Bile Duct:  0.5 cm in diameter; within normal limits in caliber.  Liver:  Heterogeneous echotexture, with a mildly nodular contour, raising question for mild cirrhotic change; no focal lesions identified.  Limited Doppler evaluation demonstrates normal blood flow within the liver.  IVC:  Unremarkable in appearance.  Pancreas:  Although the pancreas is difficult to visualize in its entirety due to overlying bowel gas, no focal pancreatic abnormality is identified.  Spleen:  10.2 cm in length; within normal limits in size and echotexture.  Right kidney:  5.7 cm in length; markedly diminutive, with diffusely increased cortical echogenicity, reflecting the patient's chronic renal atrophy.  A few small cysts are suggested.  No suspicious mass seen; no evidence of hydronephrosis.  Left kidney:  5.5 cm in length; markedly diminutive, with diffusely increased cortical echogenicity, reflecting the patient's chronic renal atrophy.  A few small cysts are noted.  No suspicious mass seen; no evidence of hydronephrosis.  Abdominal  Aorta:  Normal in caliber; no aneurysm identified.  The aortic bifurcation is not visualized due to overlying bowel gas.  IMPRESSION:  1.  Suspect mild acute cholecystitis, with a 1.2 cm stone lodged at the neck of the gallbladder, and mild gallbladder wall thickening and edema, with suggestion of trace pericholecystic fluid. Positive ultrasonographic Murphy's sign elicited.  No evidence of biliary duct dilatation to suggest more distal obstruction. 2.  Mildly nodular contour of the liver raises question for mild cirrhotic change. 3.  Chronic bilateral renal atrophy noted, with few small scattered cysts seen.  Original Report Authenticated By: Tonia Ghent, M.D.   Dg Abd Acute W/chest  06/06/2011  *RADIOLOGY REPORT*  Clinical Data: 66 year old female with abdominal pain, shortness of breath, nausea and vomiting.  ACUTE ABDOMEN SERIES (ABDOMEN 2 VIEW & CHEST 1 VIEW)  Comparison: None.  Findings: Postoperative changes to the right glenohumeral joint. Cardiac size and mediastinal contours are within normal limits. Visualized tracheal air column is within normal limits.  No pneumothorax or pneumoperitoneum.  No pulmonary edema, pleural effusion or consolidation.  Nonobstructed bowel gas pattern. No acute osseous abnormality identified.  IMPRESSION: Nonobstructed bowel gas pattern, no free air. No acute cardiopulmonary abnormality.  Original Report Authenticated By: Ulla Potash III, M.D.    ROS:  As stated above in the HPI otherwise negative.  Blood pressure 85/46, pulse 104, temperature 99 F (37.2 C), temperature source Oral, resp. rate 16, height 5\' 4"  (1.626 m), weight 74.5 kg (164 lb 3.9 oz), SpO2 100.00%.    PE: Gen: NAD, Alert and Oriented HEENT:  [place]/AT, EOMI Neck: Supple, no LAD Lungs: CTA Bilaterally CV: RRR without M/G/R ABM: Soft, tender in the epigastric region, +BS Ext: No C/C/E  Assessment/Plan: 1) Possible upper GI bleed. 2) Mild acute cholecystitis.  Plan: 1) EGD  tomorrow.  Barby Colvard D 06/08/2011, 5:22 PM

## 2011-06-08 NOTE — Progress Notes (Signed)
Utilization review complete 

## 2011-06-09 ENCOUNTER — Other Ambulatory Visit: Payer: Self-pay | Admitting: Gastroenterology

## 2011-06-09 ENCOUNTER — Encounter (HOSPITAL_COMMUNITY)
Admission: EM | Disposition: A | Discharge status: Home / Self Care | Payer: Self-pay | Source: Home / Self Care | Attending: Internal Medicine

## 2011-06-09 ENCOUNTER — Encounter (HOSPITAL_COMMUNITY): Payer: Self-pay | Admitting: Gastroenterology

## 2011-06-09 HISTORY — PX: ESOPHAGOGASTRODUODENOSCOPY: SHX5428

## 2011-06-09 LAB — COMPREHENSIVE METABOLIC PANEL
BUN: 34 mg/dL — ABNORMAL HIGH (ref 6–23)
Calcium: 8.4 mg/dL (ref 8.4–10.5)
GFR calc Af Amer: 10 mL/min — ABNORMAL LOW (ref >90)
Glucose, Bld: 77 mg/dL (ref 70–99)
Total Protein: 6.3 g/dL (ref 6.0–8.3)

## 2011-06-09 LAB — GLUCOSE, CAPILLARY: Glucose-Capillary: 86 mg/dL (ref 70–99)

## 2011-06-09 LAB — CBC
Hemoglobin: 10.2 g/dL — ABNORMAL LOW (ref 12.0–15.0)
RBC: 3.35 MIL/uL — ABNORMAL LOW (ref 3.87–5.11)
WBC: 8 10*3/uL (ref 4.0–10.5)

## 2011-06-09 LAB — LIPASE, BLOOD: Lipase: 80 U/L — ABNORMAL HIGH (ref 11–59)

## 2011-06-09 LAB — TYPE AND SCREEN

## 2011-06-09 SURGERY — EGD (ESOPHAGOGASTRODUODENOSCOPY)
Anesthesia: Moderate Sedation

## 2011-06-09 MED ORDER — BUTAMBEN-TETRACAINE-BENZOCAINE 2-2-14 % EX AERO
INHALATION_SPRAY | CUTANEOUS | Status: DC | PRN
  Administered 2011-06-09: 2 via TOPICAL

## 2011-06-09 MED ORDER — OXYCODONE HCL 5 MG PO TABS
5.0000 mg | ORAL_TABLET | ORAL | Status: DC | PRN
  Administered 2011-06-09: 5 mg via ORAL
  Administered 2011-06-09 – 2011-06-10 (×4): 10 mg via ORAL
  Administered 2011-06-11: 5 mg via ORAL
  Administered 2011-06-11: 10 mg via ORAL
  Administered 2011-06-12: 5 mg via ORAL
  Administered 2011-06-12: 10 mg via ORAL
  Administered 2011-06-13: 5 mg via ORAL
  Filled 2011-06-09: qty 1
  Filled 2011-06-09: qty 2
  Filled 2011-06-09 (×3): qty 1
  Filled 2011-06-09 (×4): qty 2

## 2011-06-09 MED ORDER — SUCRALFATE 1 G PO TABS
1.0000 g | ORAL_TABLET | Freq: Three times a day (TID) | ORAL | Status: DC
  Administered 2011-06-09 – 2011-06-14 (×18): 1 g via ORAL
  Filled 2011-06-09 (×23): qty 1

## 2011-06-09 MED ORDER — FENTANYL NICU IV SYRINGE 50 MCG/ML
INJECTION | INTRAMUSCULAR | Status: DC | PRN
  Administered 2011-06-09 (×5): 20 ug via INTRAVENOUS

## 2011-06-09 MED ORDER — FENTANYL CITRATE 0.05 MG/ML IJ SOLN
INTRAMUSCULAR | Status: AC
  Filled 2011-06-09: qty 2

## 2011-06-09 MED ORDER — MIDAZOLAM HCL 10 MG/2ML IJ SOLN
INTRAMUSCULAR | Status: DC | PRN
  Administered 2011-06-09 (×5): 2 mg via INTRAVENOUS

## 2011-06-09 MED ORDER — MIDAZOLAM HCL 10 MG/2ML IJ SOLN
INTRAMUSCULAR | Status: AC
  Filled 2011-06-09: qty 2

## 2011-06-09 MED ORDER — MORPHINE SULFATE 2 MG/ML IJ SOLN: 1.0000 mg | INTRAMUSCULAR | Status: DC | PRN

## 2011-06-09 NOTE — Progress Notes (Signed)
[facility] [place] TEAM 8  Subjective: Samantha [name] is an 66 y.o. female with history of end-stage renal disease on hemodialysis (Monday, Wednesday, Friday) and hypertension who presented to the emergency room with a two-day history of diffuse abdominal pain, coffee-ground emesis, and a bit of melanotic stool.  Evaluation in the emergency room included a hemoglobin of 9.5.    At the time of my evaluation today the patient is very somnolent.  She is resting comfortably.  She denies active complaints at the present time with exception to persistent right upper quadrant tenderness.  I have completed a full review of her hospital chart.  Objective: Weight change: -2.3 kg (-5 lb 1.1 oz)  Intake/Output Summary (Last 24 hours) at 06/09/11 1418 Last data filed at 06/09/11 0658  Gross per 24 hour  Intake    578 ml  Output   3318 ml  Net  -2740 ml   Blood pressure 101/43, pulse 88, temperature 97.8 F (36.6 C), temperature source Oral, resp. rate 19, height 5\' 4"  (1.626 m), weight 72.4 kg (159 lb 9.8 oz), SpO2 100.00%.  Physical Exam: General: No acute respiratory distress Lungs: Clear to auscultation bilaterally without wheezes or crackles Cardiovascular: Regular rate and rhythm without murmur gallop or rub  Abdomen: nondistended, tender to palpation in the right upper quadrant, soft, bowel sounds positive, no rebound, no ascites, no appreciable mass Extremities: No significant cyanosis, clubbing, or edema bilateral lower extremities  Lab Results:  [facility] 06/09/11 0350 06/08/11 0916 06/06/11 1920  NA 135 131* 136  K 3.5 4.6 3.6  CL 94* 86* 86*  CO2 27 27 30   GLUCOSE 77 98 86  BUN 34* 98* 61*  CREATININE 4.71* 9.29* 6.02*  CALCIUM 8.4 8.5 10.0  MG -- -- --  PHOS -- 6.9* --    Basename 06/09/11 0350 06/08/11 0916  AST 319* 516*  ALT 273* 365*  ALKPHOS 79 94  BILITOT 1.3* 1.2  PROT 6.3 6.7  ALBUMIN 2.3* 2.5*    Basename 06/09/11 0350 06/08/11 2111 06/08/11  0916 06/06/11 1920  WBC -- 9.6 8.8 7.5  NEUTROABS -- -- -- --  HGB 9.6* 9.3* 9.8* --  HCT 28.5* 27.9* 29.0* --  MCV -- 89.7 90.3 95.2  PLT -- 78* 70* 133*   Micro Results: Recent Results (from the past 240 hour(s))  MRSA PCR SCREENING     Status: Normal   Collection Time   06/07/11 11:32 PM      Component Value Range Status Comment   MRSA by PCR NEGATIVE  NEGATIVE  Final     Studies/Results: All recent x-ray/radiology reports have been reviewed in detail.   Medications: I have reviewed the patient's complete medication list.  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

## 2011-06-09 NOTE — H&P (View-Only) (Signed)
Subjective: Currently in ENDO suite awaiting EGD.  Objective: Vital signs in last 24 hours: Blood pressure 123/48, pulse 89, temperature 98.8 F (37.1 C), temperature source Oral, resp. rate 18, height 5' 4" (1.626 m), weight 72.4 kg (159 lb 9.8 oz), SpO2 97.00%.   Intake/Output from previous day: 02/21 0701 - 02/22 0700 In: 771 [I.V.:231; IV Piggyback:500] Out: 3318  Intake/Output this shift:   Weight change: -2.3 kg (-5 lb 1.1 oz)   PHYSICAL EXAM General--Awake, still complains of RUQ pain and tenderness Chest--clear Heart--no rub Abd--tender RUQ Extr--AVG L upper arm patent Lab Results:   Lab 06/09/11 0350 06/08/11 0916 06/06/11 1920  NA 135 131* 136  K 3.5 4.6 3.6  CL 94* 86* 86*  CO2 27 27 30  BUN 34* 98* 61*  CREATININE 4.71* 9.29* 6.02*  EGFR -- -- --  GLUCOSE 77 -- --  CALCIUM 8.4 8.5 10.0  PHOS -- 6.9* --      Basename 06/09/11 0350 06/08/11 2111 06/08/11 0916  WBC -- 9.6 8.8  HGB 9.6* 9.3* --  HCT 28.5* 27.9* --  PLT -- 78* 70*    Basename 06/07/11 0304  IRON --  TIBC --  FERRITIN 1887*   :       Assessment/Plan: 1.  UGI bleed.  Hgb stable post PRBC.  For EGD today 2.  RUQ pain --no results from HIDA scan yet--on cipro + flagyl 3.  ESRD--for HD in AM 4.  High BP--123/48 now on no BP meds 5.  Hgb stable, ferritin high--begin aranesp 100/wk 6.  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  Continue Zemplar and phos binder when taking po 7.  Nutrition--check alb in HD tomorrow 8.  Hypothyroid--continue thyroid replacement  Cmet, phos, CBC in HD tomorrow   LOS: 3 days   Samantha [name] F 06/09/2011,10:09 AM   .labalb 

## 2011-06-09 NOTE — Progress Notes (Signed)
  Subjective: Feels better, but still having epigastric and right sided pain.  Objective: Vital signs in last 24 hours: Temp:  [97.3 F (36.3 C)-99.3 F (37.4 C)] 99 F (37.2 C) (02/22 0721) Pulse Rate:  [76-109] 90  (02/22 0721) Resp:  [15-22] 19  (02/22 0721) BP: (81-109)/(44-57) 89/46 mmHg (02/22 0721) SpO2:  [97 %-100 %] 99 % (02/22 0721) Weight:  [156 lb 4.9 oz (70.9 kg)-159 lb 9.8 oz (72.4 kg)] 159 lb 9.8 oz (72.4 kg) (02/22 0048) Last BM Date: 06/07/11  Intake/Output from previous day: 02/21 0701 - 02/22 0700 In: 671 [I.V.:231; IV Piggyback:400] Out: 3318  Intake/Output this shift:    General appearance: cooperative and no distress GI: abdomen is tender in epigastric and RUQ areas.  ND, no peritoneal signs.    Lab Results:   Basename 06/09/11 0350 06/08/11 2111 06/08/11 0916  WBC -- 9.6 8.8  HGB 9.6* 9.3* --  HCT 28.5* 27.9* --  PLT -- 78* 70*   BMET  Basename 06/09/11 0350 06/08/11 0916  NA 135 131*  K 3.5 4.6  CL 94* 86*  CO2 27 27  GLUCOSE 77 98  BUN 34* 98*  CREATININE 4.71* 9.29*  CALCIUM 8.4 8.5   PT/INR  Basename 06/06/11 1920  LABPROT 17.4*  INR 1.40   ABG No results found for this basename: PHART:2,PCO2:2,PO2:2,HCO3:2 in the last 72 hours  Studies/Results: No results found.  Anti-infectives: Anti-infectives     Start     Dose/Rate Route Frequency Ordered Stop   06/08/11 1500   metroNIDAZOLE (FLAGYL) IVPB 500 mg        500 mg 100 mL/hr over 60 Minutes Intravenous Every 8 hours 06/08/11 1350     06/08/11 1500   ciprofloxacin (CIPRO) IVPB 400 mg        400 mg 200 mL/hr over 60 Minutes Intravenous Every 24 hours 06/08/11 1423            Assessment/Plan: s/p Procedure(s) (LRB): ESOPHAGOGASTRODUODENOSCOPY (EGD) (N/A) awaiting EGD results and HIDA scan. If ulcer is present and HIDA negative, then would not offer surgery at this time.  she may have concurrent problems but trying to sort this out with endoscopy and HIDA.  LOS: 3  days    Lodema Pilot DAVID 06/09/2011

## 2011-06-09 NOTE — Progress Notes (Addendum)
Subjective: Currently in ENDO suite awaiting EGD.  Objective: Vital signs in last 24 hours: Blood pressure 123/48, pulse 89, temperature 98.8 F (37.1 C), temperature source Oral, resp. rate 18, height 5\' 4"  (1.626 m), weight 72.4 kg (159 lb 9.8 oz), SpO2 97.00%.   Intake/Output from previous day: 02/21 0701 - 02/22 0700 In: 771 [I.V.:231; IV Piggyback:500] Out: 3318  Intake/Output this shift:   Weight change: -2.3 kg (-5 lb 1.1 oz)   PHYSICAL EXAM General--Awake, still complains of RUQ pain and tenderness Chest--clear Heart--no rub Abd--tender RUQ Extr--AVG L upper arm patent Lab Results:   Lab 06/09/11 0350 06/08/11 0916 06/06/11 1920  NA 135 131* 136  K 3.5 4.6 3.6  CL 94* 86* 86*  CO2 27 27 30   BUN 34* 98* 61*  CREATININE 4.71* 9.29* 6.02*  EGFR -- -- --  GLUCOSE 77 -- --  CALCIUM 8.4 8.5 10.0  PHOS -- 6.9* --      Basename 06/09/11 0350 06/08/11 2111 06/08/11 0916  WBC -- 9.6 8.8  HGB 9.6* 9.3* --  HCT 28.5* 27.9* --  PLT -- 78* 70*    Basename 06/07/11 0304  IRON --  TIBC --  FERRITIN 1887*   :       Assessment/Plan: 1.  UGI bleed.  Hgb stable post PRBC.  For EGD today 2.  RUQ pain --no results from HIDA scan yet--on cipro + flagyl 3.  ESRD--for HD in AM 4.  High BP--123/48 now on no BP meds 5.  Hgb stable, ferritin high--begin aranesp 100/wk 6.  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  Continue Zemplar and phos binder when taking po 7.  Nutrition--check alb in HD tomorrow 8.  Hypothyroid--continue thyroid replacement  Cmet, phos, CBC in HD tomorrow   LOS: 3 days   Rudolpho Claxton F 06/09/2011,10:09 AM   .labalb

## 2011-06-09 NOTE — Op Note (Signed)
Eligha Bridegroom [facility] [address] [place], [place]  [address]  OPERATIVE PROCEDURE REPORT  PATIENT:  Samantha [name], Samantha [name]  MR#:  [id number] BIRTHDATE:  06-25-1945  GENDER:  female ENDOSCOPIST:  Jeani Hawking, MD PROCEDURE DATE:  06/09/2011 PROCEDURE:  EGD with biopsy, 21308 ASA CLASS:  Class III INDICATIONS:  Melena and anemia MEDICATIONS:  Fentanyl 100 mcg IV, Versed 10 mg IV  DESCRIPTION OF PROCEDURE:   After the risks benefits and alternatives of the procedure were thoroughly explained, informed consent was obtained.  The EG-[id number] ([id number]) endoscope was introduced through the mouth and advanced to the second portion of the duodenum, without limitations.  The instrument was slowly withdrawn as the mucosa was fully examined. <<PROCEDUREIMAGES>>  FINDINGS:  The esophagus exhibited medium sized varices. No evidence of red wale signs or any stigmata of bleeding. Portal hypertensive gastropathy was noted. No fundic varices. In the antrum and pylorus clean-based ulcers were identified. The larges ulcer was in the pylorus and the edges were rolled. ? Inflammatory reaction versus a malignant etiology. A shallow clean-based duodenal bulb ulcer was also identified. Cold biopsies of the gastric ulcer were performed.    Retroflexed views revealed no abnormalities.    The scope was then withdrawn from the patient and the procedure terminated.  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 2) Antral, pyloric, and duodenal ulcers. 3) Portal hypertensive gastropathy. RECOMMENDATIONS:  1) Await biopsy results 2) Sucralfate 1 gram QID. 3) Continue with Protonix. ___________________ Jeani Hawking, MD  n. Rosalie DoctorJeani Hawking at 06/09/2011 12:13 PM  Craig Guess, [phone]

## 2011-06-09 NOTE — Interval H&P Note (Signed)
History and Physical Interval Note:  06/09/2011 10:53 AM  Samantha [name]  has presented today for surgery, with the diagnosis of GI bleed  The various methods of treatment have been discussed with the patient and family. After consideration of risks, benefits and other options for treatment, the patient has consented to  Procedure(s) (LRB): ESOPHAGOGASTRODUODENOSCOPY (EGD) (N/A) as a surgical intervention .  The patients' history has been reviewed, patient examined, no change in status, stable for surgery.  I have reviewed the patients' chart and labs.  Questions were answered to the patient's satisfaction.     [name] D

## 2011-06-10 ENCOUNTER — Inpatient Hospital Stay (HOSPITAL_COMMUNITY): Payer: Medicare (Managed Care)

## 2011-06-10 LAB — CBC
HCT: 28 % — ABNORMAL LOW (ref 36.0–46.0)
Hemoglobin: 9.4 g/dL — ABNORMAL LOW (ref 12.0–15.0)
MCHC: 33 g/dL (ref 30.0–36.0)
Platelets: 77 10*3/uL — ABNORMAL LOW (ref 150–400)
RDW: 19.7 % — ABNORMAL HIGH (ref 11.5–15.5)
RDW: 19.5 % — ABNORMAL HIGH (ref 11.5–15.5)
WBC: 6.8 10*3/uL (ref 4.0–10.5)

## 2011-06-10 LAB — COMPREHENSIVE METABOLIC PANEL
Albumin: 2.3 g/dL — ABNORMAL LOW (ref 3.5–5.2)
Alkaline Phosphatase: 84 U/L (ref 39–117)
BUN: 50 mg/dL — ABNORMAL HIGH (ref 6–23)
Chloride: 90 mEq/L — ABNORMAL LOW (ref 96–112)
Glucose, Bld: 78 mg/dL (ref 70–99)
Potassium: 3.7 mEq/L (ref 3.5–5.1)
Total Bilirubin: 1.2 mg/dL (ref 0.3–1.2)

## 2011-06-10 LAB — HEPATITIS C ANTIBODY: HCV Ab: NEGATIVE

## 2011-06-10 LAB — PHOSPHORUS: Phosphorus: 4.9 mg/dL — ABNORMAL HIGH (ref 2.3–4.6)

## 2011-06-10 MED ORDER — PANTOPRAZOLE SODIUM 40 MG PO TBEC
40.0000 mg | DELAYED_RELEASE_TABLET | Freq: Two times a day (BID) | ORAL | Status: DC
  Administered 2011-06-10 – 2011-06-14 (×8): 40 mg via ORAL
  Filled 2011-06-10 (×8): qty 1

## 2011-06-10 MED ORDER — TECHNETIUM TC 99M MEBROFENIN IV KIT
5.5000 | PACK | Freq: Once | INTRAVENOUS | Status: AC | PRN
  Administered 2011-06-10: 6 via INTRAVENOUS

## 2011-06-10 MED ORDER — OXYCODONE HCL 5 MG PO TABS
ORAL_TABLET | ORAL | Status: AC
  Administered 2011-06-10: 10 mg via ORAL
  Filled 2011-06-10: qty 2

## 2011-06-10 MED ORDER — PARICALCITOL 5 MCG/ML IV SOLN
INTRAVENOUS | Status: AC
  Administered 2011-06-10: 1 ug via INTRAVENOUS
  Filled 2011-06-10: qty 1

## 2011-06-10 MED ORDER — BOOST / RESOURCE BREEZE PO LIQD
1.0000 | Freq: Three times a day (TID) | ORAL | Status: DC
  Administered 2011-06-10 – 2011-06-13 (×7): 1 via ORAL

## 2011-06-10 NOTE — Progress Notes (Signed)
Patient ID: Samantha [name], female   DOB: [birth year], 66 y.o.   MRN: [id number] Subjective: No acute events.  She complains of right sided pain.  Objective: Vital signs in last 24 hours: Temp:  [97.8 F (36.6 C)-99.1 F (37.3 C)] 98.9 F (37.2 C) (02/23 0800) Pulse Rate:  [83-93] 88  (02/23 0800) Resp:  [14-28] 16  (02/23 0800) BP: (65-123)/(26-71) 101/52 mmHg (02/23 0800) SpO2:  [97 %-100 %] 98 % (02/23 0800) Weight:  [75 kg (165 lb 5.5 oz)-75.2 kg (165 lb 12.6 oz)] 75.2 kg (165 lb 12.6 oz) (02/23 0500) Last BM Date: 06/07/11  Intake/Output from previous day: 02/22 0701 - 02/23 0700 In: 1260 [P.O.:860; IV Piggyback:400] Out: -  Intake/Output this shift:    General appearance: alert and no distress GI: tender in the right upper quadrant  Lab Results:  Basename 06/10/11 0430 06/09/11 2129 06/09/11 0350 06/08/11 2111  WBC 6.8 8.0 -- 9.6  HGB 9.4* 10.2* 9.6* --  HCT 28.0* 30.9* 28.5* --  PLT 80* 81* -- 78*   BMET  Basename 06/10/11 0430 06/09/11 0350 06/08/11 0916  NA 128* 135 131*  K 3.7 3.5 4.6  CL 90* 94* 86*  CO2 24 27 27   GLUCOSE 78 77 98  BUN 50* 34* 98*  CREATININE 6.51* 4.71* 9.29*  CALCIUM 8.2* 8.4 8.5   LFT  Basename 06/10/11 0430  PROT 6.4  ALBUMIN 2.3*  AST 161*  ALT 186*  ALKPHOS 84  BILITOT 1.2  BILIDIR --  IBILI --   PT/INR No results found for this basename: LABPROT:2,INR:2 in the last 72 hours Hepatitis Panel No results found for this basename: HEPBSAG,HCVAB,HEPAIGM,HEPBIGM in the last 72 hours C-Diff No results found for this basename: CDIFFTOX:3 in the last 72 hours Fecal Lactopherrin No results found for this basename: FECLLACTOFRN in the last 72 hours  Studies/Results: No results found.  Medications:  Scheduled:   . calcium acetate  1,334 mg Oral TID WC  . ciprofloxacin  400 mg Intravenous Q24H  . levothyroxine  100 mcg Oral BH-q7a  . metronidazole  500 mg Intravenous Q8H  . pantoprazole (PROTONIX) IV  40 mg Intravenous  Q12H  . paricalcitol  1 mcg Intravenous 3 times weekly  . sodium chloride  3 mL Intravenous Q12H  . sucralfate  1 g Oral TID WC & HS   Continuous:   Assessment/Plan: 1) Cirrhosis - ? Etiology. 2) Gastric and duodenal ulcers. 3) Acute cholecystitis.   The patient has esophageal varices and nonbleeding gastric/duodenal ulcers.  The ulcers should be remedied with the use of Protonix and sucralfate.  As for her cirrhosis and preop risk assessment, she is a Child's Class A.  I do not know the etiology of her cirrhosis.  I cannot obtain an accurate history about prior ETOH use.  Her HBV is negative.  For some reason her HCV evaluation was cancelled.  Plan: 1) Continue with Protonix and sucralfate. 2) Lap chole per surgery. 3) Check HCV Ab.  LOS: 4 days   Manley Fason D 06/10/2011, 8:11 AM

## 2011-06-10 NOTE — Progress Notes (Signed)
1 Day Post-Op  Subjective: EGD results noted.  Still hurts in right side  Objective: Vital signs in last 24 hours: Temp:  [97.8 F (36.6 C)-99.1 F (37.3 C)] 98.9 F (37.2 C) (02/23 0800) Pulse Rate:  [83-93] 88  (02/23 0800) Resp:  [14-28] 16  (02/23 0800) BP: (65-123)/(26-71) 101/52 mmHg (02/23 0800) SpO2:  [97 %-100 %] 98 % (02/23 0800) Weight:  [165 lb 5.5 oz (75 kg)-165 lb 12.6 oz (75.2 kg)] 165 lb 12.6 oz (75.2 kg) (02/23 0500) Last BM Date: 06/07/11  Intake/Output from previous day: 02/22 0701 - 02/23 0700 In: 1260 [P.O.:860; IV Piggyback:400] Out: -  Intake/Output this shift:    General appearance: cooperative and no distress Resp: clear to auscultation bilaterally Cardio: regular rate and rhythm, S1, S2 normal, no murmur, click, rub or gallop GI: soft, right sided tenderness, ND, no guarding, no peritoneal signs  Lab Results:   Basename 06/10/11 0430 06/09/11 2129  WBC 6.8 8.0  HGB 9.4* 10.2*  HCT 28.0* 30.9*  PLT 80* 81*   BMET  Basename 06/10/11 0430 06/09/11 0350  NA 128* 135  K 3.7 3.5  CL 90* 94*  CO2 24 27  GLUCOSE 78 77  BUN 50* 34*  CREATININE 6.51* 4.71*  CALCIUM 8.2* 8.4   PT/INR No results found for this basename: LABPROT:2,INR:2 in the last 72 hours ABG No results found for this basename: PHART:2,PCO2:2,PO2:2,HCO3:2 in the last 72 hours  Studies/Results: No results found.  Anti-infectives: Anti-infectives     Start     Dose/Rate Route Frequency Ordered Stop   06/08/11 1500   metroNIDAZOLE (FLAGYL) IVPB 500 mg        500 mg 100 mL/hr over 60 Minutes Intravenous Every 8 hours 06/08/11 1350     06/08/11 1500   ciprofloxacin (CIPRO) IVPB 400 mg        400 mg 200 mL/hr over 60 Minutes Intravenous Every 24 hours 06/08/11 1423            Assessment/Plan: s/p Procedure(s) (LRB): ESOPHAGOGASTRODUODENOSCOPY (EGD) (N/A) HIDA this am, If HIDA negative, would treat for PUD disease.  If positive will consider cholecystectomy, however,  with varices and other comorbidities, she is high risk for complications  LOS: 4 days    Samantha [name] DAVID 06/10/2011

## 2011-06-10 NOTE — Progress Notes (Signed)
[facility]    Subjective: Lying in bed.  NPO prior to HIDA scan sched for 9 AM today.  Points to RUQ and says "It still hurts!"  Objective: Vital signs in last 24 hours: Blood pressure 101/52, pulse 88, temperature 98.9 F (37.2 C), temperature source Oral, resp. rate 16, height 5\' 4"  (1.626 m), weight 75.2 kg (165 lb 12.6 oz), SpO2 98.00%.   Intake/Output from previous day: 02/22 0701 - 02/23 0700 In: 1260 [P.O.:860; IV Piggyback:400] Out: -  Intake/Output this shift:      PHYSICAL EXAM General--awake painful RUQChest-- Heart--no rub Abd--tender RUQ Extr--no edema, AVG patent L upper arm  Lab Results:   Lab 06/10/11 0430 06/09/11 0350 06/08/11 0916  NA 128* 135 131*  K 3.7 3.5 4.6  CL 90* 94* 86*  CO2 24 27 27   BUN 50* 34* 98*  CREATININE 6.51* 4.71* 9.29*  EGFR -- -- --  GLUCOSE 78 -- --  CALCIUM 8.2* 8.4 8.5  PHOS 4.9* -- 6.9*     Basename 06/10/11 0430 06/09/11 2129  WBC 6.8 8.0  HGB 9.4* 10.2*  HCT 28.0* 30.9*  PLT 80* 81*   No results found for this basename: PTH in the last 72 hours   Scheduled:   . calcium acetate  1,334 mg Oral TID WC  . ciprofloxacin  400 mg Intravenous Q24H  . levothyroxine  100 mcg Oral BH-q7a  . metronidazole  500 mg Intravenous Q8H  . pantoprazole (PROTONIX) IV  40 mg Intravenous Q12H  . paricalcitol  1 mcg Intravenous 3 times weekly  . sodium chloride  3 mL Intravenous Q12H  . sucralfate  1 g Oral TID WC & HS   Continuous:   Assessment/Plan: 1. UGI bleed. Hgb stable post PRBC.  EGD  Yesterday showed gastric and duod ulcer plus non-bleeding varices.  On protonix and carafate.  Since carafate contains ALUMINUM, she shouldn't be on it for very long. 2. RUQ pain -- HIDA scan today 9 AM--on cipro + flagyl  3. ESRD--for HD  Later today  4. High BP--101/52 now on no BP meds  5. Hgb stable, ferritin high--begin aranesp 100/wk  6. 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 Continue Zemplar and phos binder when taking po    7. Nutrition--check alb in HD today 8. Hypothyroid--continue thyroid replacement 9.  Cirrhosis--Dr. Elnoria Howard checking for Hep C    LOS: 4 days   Yenty Bloch F 06/10/2011,8:56 AM   .labalb

## 2011-06-10 NOTE — Progress Notes (Signed)
[facility] [place] TEAM 8  Subjective: Samantha [name] is a 66 y.o. female with history of end-stage renal disease on hemodialysis (Monday, Wednesday, Friday) and hypertension who presented to the emergency room with a two-day history of diffuse abdominal pain, coffee-ground emesis, and a bit of melanotic stool.  Evaluation in the emergency room included a hemoglobin of 9.5.    She is much more alert at the time of my evaluation today.  She is very hard of hearing.  She complains of ongoing right upper quadrant abdominal pain.  She is tolerating a popsicle at the present time without difficulty.  She denies chest pain or shortness of breath.  She denies nausea or vomiting.  Objective: Weight change: 4.1 kg (9 lb 0.6 oz)  Intake/Output Summary (Last 24 hours) at 06/10/11 1634 Last data filed at 06/10/11 0701  Gross per 24 hour  Intake    700 ml  Output      0 ml  Net    700 ml   Blood pressure 102/74, pulse 94, temperature 97.4 F (36.3 C), temperature source Axillary, resp. rate 29, height 5\' 4"  (1.626 m), weight 75.2 kg (165 lb 12.6 oz), SpO2 100.00%.  Physical Exam: General: No acute respiratory distress Lungs: Clear to auscultation bilaterally without wheezes or crackles Cardiovascular: Regular rate and rhythm without murmur gallop or rub  Abdomen: nondistended, tender to palpation in the right upper quadrant, soft, bowel sounds positive, no rebound, no ascites, no appreciable mass Extremities: No significant cyanosis, clubbing, or edema bilateral lower extremities  Lab Results:  Basename 06/10/11 0430 06/09/11 0350 06/08/11 0916  NA 128* 135 131*  K 3.7 3.5 4.6  CL 90* 94* 86*  CO2 24 27 27   GLUCOSE 78 77 98  BUN 50* 34* 98*  CREATININE 6.51* 4.71* 9.29*  CALCIUM 8.2* 8.4 8.5  MG -- -- --  PHOS 4.9* -- 6.9*    Basename 06/10/11 0430 06/09/11 0350  AST 161* 319*  ALT 186* 273*  ALKPHOS 84 79  BILITOT 1.2 1.3*  PROT 6.4 6.3  ALBUMIN 2.3* 2.3*     Basename 06/10/11 0800 06/10/11 0430 06/09/11 2129  WBC 6.1 6.8 8.0  NEUTROABS -- -- --  HGB 9.3* 9.4* 10.2*  HCT 28.2* 28.0* 30.9*  MCV 91.9 91.2 92.2  PLT 77* 80* 81*   Micro Results: Recent Results (from the past 240 hour(s))  MRSA PCR SCREENING     Status: Normal   Collection Time   06/07/11 11:32 PM      Component Value Range Status Comment   MRSA by PCR NEGATIVE  NEGATIVE  Final     Studies/Results: All recent x-ray/radiology reports have been reviewed in detail.   Medications: I have reviewed the patient's complete medication list.  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  Antral, pyloric, and duodenal ulcers Likely source of acute/subacute bleeding - tx as per GI reccs -  f/u bx results   Cholelithiasis and cholecystitis with obstruction/Transaminitis HIDA scan without evidence of acute choly-LFTs improving  Gallstone pancreatitis Much improved - suspect was transient and mild - recheck lipase in AM  Hepatitis B surface antibody positive Most c/w immunization or recovery from and subsequent immunity to prior Hep B infection  End-stage renal disease (T,Th, Sat HD) HD as per Nephrology   Hypothyroidism Remains on replacement tx  Lonia Blood, MD [facility] Office  [phone] Pager [phone]  On-Call/Text Page:      [email or web address]      password [facility]

## 2011-06-11 LAB — CBC
MCHC: 33.6 g/dL (ref 30.0–36.0)
Platelets: 95 10*3/uL — ABNORMAL LOW (ref 150–400)
RDW: 19.2 % — ABNORMAL HIGH (ref 11.5–15.5)
WBC: 9.6 10*3/uL (ref 4.0–10.5)

## 2011-06-11 LAB — HEPATIC FUNCTION PANEL
Albumin: 2.4 g/dL — ABNORMAL LOW (ref 3.5–5.2)
Indirect Bilirubin: 0.8 mg/dL (ref 0.3–0.9)
Total Protein: 7.2 g/dL (ref 6.0–8.3)

## 2011-06-11 MED ORDER — SODIUM CHLORIDE 0.9 % IV BOLUS (SEPSIS)
250.0000 mL | Freq: Once | INTRAVENOUS | Status: AC
  Administered 2011-06-11: 250 mL via INTRAVENOUS

## 2011-06-11 NOTE — Progress Notes (Signed)
[name]    Subjective: "It hurts right here!" (points to RUQ) Had dialysis yetsreday  Objective: Vital signs in last 24 hours: Blood pressure 98/57, pulse 93, temperature 99 [name] (37.2 C), temperature source Oral, resp. rate 14, height 5\' 4"  (1.626 m), weight 70.9 kg (156 lb 4.9 oz), SpO2 98.00%.    PHYSICAL EXAM General--awake, cooperative  Chest--clear Heart--no rub Abd--tender RUQ, no rebound Extr--no edema, AVG patent L upper arm  Lab Results:   Lab 06/10/11 0430 06/09/11 0350 06/08/11 0916  NA 128* 135 131*  K 3.7 3.5 4.6  CL 90* 94* 86*  CO2 24 27 27   BUN 50* 34* 98*  CREATININE 6.51* 4.71* 9.29*  EGFR -- -- --  GLUCOSE 78 -- --  CALCIUM 8.2* 8.4 8.5  PHOS 4.9* -- 6.9*     Basename 06/11/11 0500 06/10/11 0800  WBC 9.6 6.1  HGB 9.9* 9.3*  HCT 29.5* 28.2*  PLT 95* 77*   No results found for this basename: PTH in the last 72 hours   Scheduled:   . calcium acetate  1,334 mg Oral TID WC  . ciprofloxacin  400 mg Intravenous Q24H  . feeding supplement  1 Container Oral TID WC  . levothyroxine  100 mcg Oral BH-q7a  . metronidazole  500 mg Intravenous Q8H  . pantoprazole  40 mg Oral BID AC  . paricalcitol  1 mcg Intravenous 3 times weekly  . sodium chloride  250 mL Intravenous Once  . sodium chloride  3 mL Intravenous Q12H  . sucralfate  1 g Oral TID WC & HS  . DISCONTD: pantoprazole (PROTONIX) IV  40 mg Intravenous Q12H   Continuous:   Assessment/Plan 1. UGI bleed. Hgb stable post PRBC. 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 On protonix and carafate. Since carafate contains ALUMINUM, she shouldn't be on it for very long.  2. 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  Still has gallstones and RUQ pain and tenderness. Only on clear liquids still--can this be advanced? 3. ESRD--for HD T-Th-Sat 4. High BP--101/52 now on no BP meds  5. Hgb stable, ferritin high--begin aranesp 100/wk  6.  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 Continue Zemplar and phos binder once taking po.  PTH 225 on 10 May 2011  7. Nutrition--alb 2.4--need to advance diet  8. Hypothyroid--continue thyroid replacement  9. Cirrhosis--Hep C neg.  LFTs better-SGOT 161 now 108, SGPT 186 now 142, alk phos 84 now 82      Dr. Elnoria [name] following Cmet, phos, CBC in AM   LOS: 5 days   Samantha [name] 06/11/2011,8:41 AM   .labalb

## 2011-06-11 NOTE — Progress Notes (Signed)
Patient ID: Samantha [name], female   DOB: January 18, 1946, 66 y.o.   MRN: [id number] Subjective: Feeling better today, but still with some tenderness in the RUQ.  Objective: Vital signs in last 24 hours: Temp:  [97.4 F (36.3 C)-99.7 F (37.6 C)] 99 F (37.2 C) (02/24 0722) Pulse Rate:  [63-119] 93  (02/24 0700) Resp:  [10-29] 14  (02/24 0700) BP: (70-136)/(37-99) 98/57 mmHg (02/24 0722) SpO2:  [97 %-100 %] 98 % (02/24 0700) Weight:  [70.9 kg (156 lb 4.9 oz)-74.7 kg (164 lb 10.9 oz)] 70.9 kg (156 lb 4.9 oz) (02/24 0207) Last BM Date: 06/07/11  Intake/Output from previous day: 02/23 0701 - 02/24 0700 In: 1390 [P.O.:1190; IV Piggyback:200] Out: 3680  Intake/Output this shift: Total I/O In: 100 [IV Piggyback:100] Out: -   General appearance: alert and no distress GI: tenderness in the RUQ.  Appears to be improving.  Lab Results:  Basename 06/11/11 0500 06/10/11 0800 06/10/11 0430  WBC 9.6 6.1 6.8  HGB 9.9* 9.3* 9.4*  HCT 29.5* 28.2* 28.0*  PLT 95* 77* 80*   BMET  Basename 06/10/11 0430 06/09/11 0350 06/08/11 0916  NA 128* 135 131*  K 3.7 3.5 4.6  CL 90* 94* 86*  CO2 24 27 27   GLUCOSE 78 77 98  BUN 50* 34* 98*  CREATININE 6.51* 4.71* 9.29*  CALCIUM 8.2* 8.4 8.5   LFT  Basename 06/11/11 0500  PROT 7.2  ALBUMIN 2.4*  AST 108*  ALT 142*  ALKPHOS 82  BILITOT 1.5*  BILIDIR 0.7*  IBILI 0.8   PT/INR No results found for this basename: LABPROT:2,INR:2 in the last 72 hours Hepatitis Panel  Basename 06/10/11 0800  HEPBSAG --  HCVAB NEGATIVE  HEPAIGM --  HEPBIGM --   C-Diff No results found for this basename: CDIFFTOX:3 in the last 72 hours Fecal Lactopherrin No results found for this basename: FECLLACTOFRN in the last 72 hours  Studies/Results: Nm Hepatobiliary Liver Func  06/10/2011  *RADIOLOGY REPORT*  Clinical Data:  Right upper quadrant pain.  NUCLEAR MEDICINE HEPATOBILIARY IMAGING  Technique:  Sequential images of the abdomen were obtained out to 60  minutes following intravenous administration of radiopharmaceutical.  Radiopharmaceutical:  5.37mCi Tc-18m Choletec  Comparison:  None.  Findings: Prompt radiopharmaceutical uptake by the liver is seen. The liver is normal and appearance.  Prompt biliary excretion activity is noted.  Biliary activity is initially seen within the gallbladder on the 25-minute image. Biliary activity reaches the small bowel by 30 minutes.  IMPRESSION: Negative.  No evidence of cystic duct or biliary obstruction.  Original Report Authenticated By: Danae Orleans, M.D.    Medications:  Scheduled:   . calcium acetate  1,334 mg Oral TID WC  . ciprofloxacin  400 mg Intravenous Q24H  . feeding supplement  1 Container Oral TID WC  . levothyroxine  100 mcg Oral BH-q7a  . metronidazole  500 mg Intravenous Q8H  . pantoprazole  40 mg Oral BID AC  . paricalcitol  1 mcg Intravenous 3 times weekly  . sodium chloride  250 mL Intravenous Once  . sodium chloride  3 mL Intravenous Q12H  . sucralfate  1 g Oral TID WC & HS  . DISCONTD: pantoprazole (PROTONIX) IV  40 mg Intravenous Q12H   Continuous:   Assessment/Plan: 1) Cirrhosis - ? Etiology.  2) Gastric and duodenal ulcers.    The HIDA scan was negative for a cystic duct obstruction.  It does not appear that she has an acute cholecystitis at  this time.  Her HCV Ab is negative and her HGB is stable.  Plan:  1) Continue with Protonix and sucralfate.     LOS: 5 days   Kinzley Savell D 06/11/2011, 8:22 AM

## 2011-06-11 NOTE — Progress Notes (Signed)
[facility] [place] TEAM 8  Subjective: Samantha [name] is a 66 y.o. female with history of end-stage renal disease on hemodialysis (Monday, Wednesday, Friday) and hypertension who presented to the emergency room with a two-day history of diffuse abdominal pain, coffee-ground emesis, and a bit of melanotic stool.  Evaluation in the emergency room included a hemoglobin of 9.5.    She is alert at the time of my evaluation today.  She is very hard of hearing.  She complains of ongoing right upper quadrant abdominal pain without change in character or severity since yesterday.  She denies chest pain or shortness of breath.  She denies nausea or vomiting.  Objective: Weight change: -0.3 kg (-10.6 oz)  Intake/Output Summary (Last 24 hours) at 06/11/11 1646 Last data filed at 06/11/11 0723  Gross per 24 hour  Intake   1050 ml  Output   3680 ml  Net  -2630 ml   Blood pressure 89/51, pulse 91, temperature 98.1 F (36.7 C), temperature source Oral, resp. rate 16, height 5\' 4"  (1.626 m), weight 70.9 kg (156 lb 4.9 oz), SpO2 99.00%.  Physical Exam: General: No acute respiratory distress Lungs: Clear to auscultation bilaterally without wheezes or crackles Cardiovascular: Regular rate and rhythm without murmur gallop or rub  Abdomen: nondistended, tender to palpation in the right upper quadrant, soft, bowel sounds positive, no rebound, no ascites, no appreciable mass Extremities: No significant cyanosis, clubbing or edema bilateral lower extremities  Lab Results:  [facility] 06/10/11 0430 06/09/11 0350  NA 128* 135  K 3.7 3.5  CL 90* 94*  CO2 24 27  GLUCOSE 78 77  BUN 50* 34*  CREATININE 6.51* 4.71*  CALCIUM 8.2* 8.4  MG -- --  PHOS 4.9* --    Basename 06/11/11 0500 06/10/11 0430  AST 108* 161*  ALT 142* 186*  ALKPHOS 82 84  BILITOT 1.5* 1.2  PROT 7.2 6.4  ALBUMIN 2.4* 2.3*    Basename 06/11/11 0500 06/10/11 0800 06/10/11 0430  WBC 9.6 6.1 6.8  NEUTROABS -- -- --    HGB 9.9* 9.3* 9.4*  HCT 29.5* 28.2* 28.0*  MCV 90.8 91.9 91.2  PLT 95* 77* 80*   Micro Results: Recent Results (from the past 240 hour(s))  MRSA PCR SCREENING     Status: Normal   Collection Time   06/07/11 11:32 PM      Component Value Range Status Comment   MRSA by PCR NEGATIVE  NEGATIVE  Final     Studies/Results: All recent x-ray/radiology reports have been reviewed in detail.   Medications: I have reviewed the patient's complete medication list.  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

## 2011-06-11 NOTE — Progress Notes (Signed)
2 Days Post-Op  Subjective: Still with fluctuating pain in RUQ.  She said that her pain is relieved with the pain meds.  HIDA neg for cholecystitis.  Objective: Vital signs in last 24 hours: Temp:  [97.4 F (36.3 C)-99.7 F (37.6 C)] 99 F (37.2 C) (02/24 0722) Pulse Rate:  [63-119] 93  (02/24 0700) Resp:  [10-29] 14  (02/24 0700) BP: (70-136)/(37-99) 98/57 mmHg (02/24 0722) SpO2:  [97 %-100 %] 98 % (02/24 0700) Weight:  [156 lb 4.9 oz (70.9 kg)-164 lb 10.9 oz (74.7 kg)] 156 lb 4.9 oz (70.9 kg) (02/24 0207) Last BM Date: 06/07/11  Intake/Output from previous day: 02/23 0701 - 02/24 0700 In: 1390 [P.O.:1190; IV Piggyback:200] Out: 3680  Intake/Output this shift: Total I/O In: 100 [IV Piggyback:100] Out: -   General appearance: alert, cooperative and no distress GI: soft, tender in RUQ and right flank, nd, no peritonitis  Lab Results:   Basename 06/11/11 0500 06/10/11 0800  WBC 9.6 6.1  HGB 9.9* 9.3*  HCT 29.5* 28.2*  PLT 95* 77*   BMET  Basename 06/10/11 0430 06/09/11 0350  NA 128* 135  K 3.7 3.5  CL 90* 94*  CO2 24 27  GLUCOSE 78 77  BUN 50* 34*  CREATININE 6.51* 4.71*  CALCIUM 8.2* 8.4   PT/INR No results found for this basename: LABPROT:2,INR:2 in the last 72 hours ABG No results found for this basename: PHART:2,PCO2:2,PO2:2,HCO3:2 in the last 72 hours  Studies/Results: Nm Hepatobiliary Liver Func  06/10/2011  *RADIOLOGY REPORT*  Clinical Data:  Right upper quadrant pain.  NUCLEAR MEDICINE HEPATOBILIARY IMAGING  Technique:  Sequential images of the abdomen were obtained out to 60 minutes following intravenous administration of radiopharmaceutical.  Radiopharmaceutical:  5.77mCi Tc-39m Choletec  Comparison:  None.  Findings: Prompt radiopharmaceutical uptake by the liver is seen. The liver is normal and appearance.  Prompt biliary excretion activity is noted.  Biliary activity is initially seen within the gallbladder on the 25-minute image. Biliary activity  reaches the small bowel by 30 minutes.  IMPRESSION: Negative.  No evidence of cystic duct or biliary obstruction.  Original Report Authenticated By: Danae Orleans, M.D.    Anti-infectives: Anti-infectives     Start     Dose/Rate Route Frequency Ordered Stop   06/08/11 1500   metroNIDAZOLE (FLAGYL) IVPB 500 mg        500 mg 100 mL/hr over 60 Minutes Intravenous Every 8 hours 06/08/11 1350     06/08/11 1500   ciprofloxacin (CIPRO) IVPB 400 mg        400 mg 200 mL/hr over 60 Minutes Intravenous Every 24 hours 06/08/11 1423            Assessment/Plan: s/p Procedure(s) (LRB): ESOPHAGOGASTRODUODENOSCOPY (EGD) (N/A) we can probably stop the antibiotics since HIDA negative.  She has known ulcers which are being treated now, however, she still has perisistent RUQ and gallstones.  Though she does not appear to have cholecystitis, she may still have symptomatic cholelithiasis.  If her symptoms do not improve with tx of ulcers, then we would need to consider cholecystectomy, but with varices and some baseline liver disease, she would be very high risk for bleeding, need for open surgery, and other perioperative complications.  LOS: 5 days    Lodema Pilot DAVID 06/11/2011

## 2011-06-12 ENCOUNTER — Encounter (HOSPITAL_COMMUNITY): Payer: Self-pay | Admitting: Gastroenterology

## 2011-06-12 LAB — COMPREHENSIVE METABOLIC PANEL
ALT: 95 U/L — ABNORMAL HIGH (ref 0–35)
AST: 80 U/L — ABNORMAL HIGH (ref 0–37)
Alkaline Phosphatase: 67 U/L (ref 39–117)
CO2: 24 mEq/L (ref 19–32)
GFR calc Af Amer: 7 mL/min — ABNORMAL LOW (ref >90)
Glucose, Bld: 104 mg/dL — ABNORMAL HIGH (ref 70–99)
Potassium: 3.6 mEq/L (ref 3.5–5.1)
Sodium: 125 mEq/L — ABNORMAL LOW (ref 135–145)
Total Protein: 6.1 g/dL (ref 6.0–8.3)

## 2011-06-12 LAB — CBC
Hemoglobin: 8.8 g/dL — ABNORMAL LOW (ref 12.0–15.0)
MCH: 30.7 pg (ref 26.0–34.0)
MCHC: 33.6 g/dL (ref 30.0–36.0)
MCV: 91.3 fL (ref 78.0–100.0)
RBC: 2.87 MIL/uL — ABNORMAL LOW (ref 3.87–5.11)

## 2011-06-12 MED ORDER — PARICALCITOL 5 MCG/ML IV SOLN
1.0000 ug | INTRAVENOUS | Status: DC
  Administered 2011-06-14: 1 ug via INTRAVENOUS
  Filled 2011-06-12: qty 0.2

## 2011-06-12 NOTE — Progress Notes (Signed)
Pt arrived to the unit with admitting dx. GI Bleed. Pt alert and oriented x3. She is ambulatory with stand by assist. Pt is hard of hearing with hearing aides at the bed side. Pt wears glasses that are present. Pt is continent of bowel. She is anuric. Has an LFA graft that is positive for bruit and thrill. No skin break down noted. She has some dry flaky skin on her buttocks and feet. She complains of some tenderness in her abdomen. VS stable. Will cont to monitor.

## 2011-06-12 NOTE — Progress Notes (Signed)
Subjective: Events through the weekend noted. Patient continues to have some abdominal pain but surgery has signed off. No evidence of any ongoing bleeding at this time. New diagnosis of cirrhosis and thrombocytopenia [with negative serologies] will need workup.  Objective: Vital signs in last 24 hours: Temp:  [98.3 F (36.8 C)-99.3 F (37.4 C)] 98.3 F (36.8 C) (02/25 1652) Pulse Rate:  [85-94] 90  (02/25 1652) Resp:  [13-25] 20  (02/25 1652) BP: (84-113)/(48-76) 113/76 mmHg (02/25 1652) SpO2:  [97 %-100 %] 98 % (02/25 1652) Weight:  [74 kg (163 lb 2.3 oz)-76 kg (167 lb 8.8 oz)] 76 kg (167 lb 8.8 oz) (02/25 1652) Last BM Date: 06/09/11 Intake/Output from previous day: 02/24 0701 - 02/25 0700 In: 340 [P.O.:240; IV Piggyback:100] Out: 1 [Urine:1] Intake/Output this shift: Total I/O In: 350 [P.O.:350] Out: -  General appearance: alert, cooperative and no distress Resp: clear to auscultation bilaterally Cardio: regular rate and rhythm, S1, S2 normal, no murmur, click, rub or gallop GI: soft, non-tender; bowel sounds normal; no masses,  no organomegaly  Lab Results:  Basename 06/12/11 0510 06/11/11 0500 06/10/11 0800  WBC 5.9 9.6 6.1  HGB 8.8* 9.9* 9.3*  HCT 26.2* 29.5* 28.2*  PLT 71* 95* 77*  BMET  Basename 06/12/11 0510 06/10/11 0430  NA 125* 128*  K 3.6 3.7  CL 88* 90*  CO2 24 24  GLUCOSE 104* 78  BUN 37* 50*  CREATININE 6.37* 6.51*  CALCIUM 8.3* 8.2*   LFT  Basename 06/12/11 0510 06/11/11 0500  PROT 6.1 --  ALBUMIN 2.1* --  AST 80* --  ALT 95* --  ALKPHOS 67 --  BILITOT 0.9 --  BILIDIR -- 0.7*  IBILI -- 0.8    Basename 06/10/11 0800  HEPBSAG --  HCVAB NEGATIVE  HEPAIGM --  HEPBIGM --   Medications: I have reviewed the patient's current medications.  Assessment/Plan: 1) Anemia with a history of PUD: Currently stable on PPI's and Carafate suspension. 2) ?Cirrhosis on ultrasound: May need a liver biopsy.  3) Cholelithiasis : Poor surgical candidate.  Hypothyroidism On thyroid supplements. 4) ESRD on HD. 5) HTN. 6) Malnutrition.  LOS: 6 days   Samantha [name] 06/12/2011, 6:53 PM

## 2011-06-12 NOTE — Progress Notes (Signed)
[facility] - Surgery Attending Patient seen and examined.  Studies reviewed and discussed with my partner, Dr. Biagio Quint.  Patient continues to complain of intermittent abdominal pain which may be attributed to multiple etiologies including peptic ulcer disease (active), hepatocellular disease (currently being evaluated), portal hypertension (varices on EGD), and the presence of gallstones (with negative HIDA scan).  Patient is at significantly high risk for operative procedures.  At this point, patient is relatively pain free with a benign abdominal exam.  She is tolerating a regular (renal) diet.  Recommend continued medical management and assessment.  No role for acute surgical intervention in this high risk patient at present.  Will sign off.  Call if surgical issues arise. Velora Heckler, MD, FACS General & Endocrine Surgery [facility]

## 2011-06-12 NOTE — Progress Notes (Signed)
[facility]    Subjective: No complaints today, up in chair, ate breakfast without difficulty.  Objective: Vital signs in last 24 hours: Blood pressure 96/49, pulse 88, temperature 98.4 F (36.9 C), temperature source Oral, resp. rate 13, height 5\' 4"  (1.626 m), weight 74 kg (163 lb 2.3 oz), SpO2 100.00%.    PHYSICAL EXAM General--awake, cooperative  Chest--clear Heart--no rub, soft SEM RUSB Abd--tender RUQ, no rebound Ext-- + pitting edema of both legs below the knees 1-2+, AVG patent L upper arm  Lab Results:   Lab 06/12/11 0510 06/10/11 0430 06/09/11 0350 06/08/11 0916  NA 125* 128* 135 --  K 3.6 3.7 3.5 --  CL 88* 90* 94* --  CO2 24 24 27  --  BUN 37* 50* 34* --  CREATININE 6.37* 6.51* 4.71* --  EGFR -- -- -- --  GLUCOSE 104* -- -- --  CALCIUM 8.3* 8.2* 8.4 --  PHOS 2.9 4.9* -- 6.9*     Basename 06/12/11 0510 06/11/11 0500  WBC 5.9 9.6  HGB 8.8* 9.9*  HCT 26.2* 29.5*  PLT 71* 95*   No results found for this basename: PTH in the last 72 hours   Scheduled:    . calcium acetate  1,334 mg Oral TID WC  . feeding supplement  1 Container Oral TID WC  . levothyroxine  100 mcg Oral [id number]  . pantoprazole  40 mg Oral BID AC  . paricalcitol  1 mcg Intravenous 3 times weekly  . sodium chloride  3 mL Intravenous Q12H  . sucralfate  1 g Oral TID WC & HS  . DISCONTD: ciprofloxacin  400 mg Intravenous Q24H  . DISCONTD: metronidazole  500 mg Intravenous Q8H   Dialysis Orders: Center: [facility] on MWF .  EDW 69.5 kg HD Bath 2K/2.25Ca Time 3hrs 45 mins Heparin 5000 U. Access AVG @ LUA BFR 400 DFR 800  Zemplar 1 mcg IV/HD Epogen 5400 Units IV/HD Venofer 0   Assessment/Plan 1. UGI bleed. Hgb stable post PRBC. 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 On protonix and carafate. Since carafate contains aluminum, she shouldn't be on it for very long.  2. 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  +gallstones. Diet advanced  to renal. 3. ESRD,  HD TTS- at 74 kg is above EDW (69.5) with leg edema, will try to get to EDW tomorrow with HD. No heparin due to GIB. 4. High BP--101/52 now on no BP meds here, no BP meds on home med list either.  5. Hgb stable, ferritin high--begin aranesp 100/wk  6. 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 Continue Zemplar and phos binder once taking po.  PTH 225 on 10 May 2011  7. Nutrition--alb 2.4, taking po 8. 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  LFTs better-SGOT 161 now 108, SGPT 186 now 142, alk phos 84 now 82. Dr. Elnoria Howard following. 9. Hyponatremia- developed in hospital.  Limit fluids, should correct with HD.   Vinson Moselle  MD [employer] [phone] pgr    [phone] cell 06/12/2011, 11:44 AM

## 2011-06-12 NOTE — Progress Notes (Signed)
[facility] [place] TEAM 8  Subjective: Samantha [name] is a 66 y.o. female with history of end-stage renal disease on hemodialysis (Monday, Wednesday, Friday) and hypertension who presented to the emergency room with a two-day history of diffuse abdominal pain, coffee-ground emesis, and a bit of melanotic stool.  Evaluation in the emergency room included a hemoglobin of 9.5.    Alert today. Continues to endorse RUQ and epigastric abdominal pain. Is not able to clarify whether the pain is improved or worsened with eating.  Denies f/c, sob, or chest pain.  Objective: Weight change: -0.7 kg (-1 lb 8.7 oz)  Intake/Output Summary (Last 24 hours) at 06/12/11 1231 Last data filed at 06/12/11 1000  Gross per 24 hour  Intake    590 ml  Output      1 ml  Net    589 ml   Blood pressure 84/50, pulse 85, temperature 98.5 F (36.9 C), temperature source Oral, resp. rate 19, height 5\' 4"  (1.626 m), weight 74 kg (163 lb 2.3 oz), SpO2 100.00%.  Physical Exam: General: No acute respiratory distress Lungs: Clear to auscultation bilaterally without wheezes or crackles Cardiovascular: Regular rate and rhythm without murmur gallop or rub  Abdomen: nondistended, tender to palpation in the right upper quadrant, soft, bowel sounds positive, no rebound, no ascites - no appreciable mass Extremities: No significant cyanosis, clubbing, or edema bilateral lower extremities  Lab Results:  [facility] 06/12/11 0510 06/10/11 0430  NA 125* 128*  K 3.6 3.7  CL 88* 90*  CO2 24 24  GLUCOSE 104* 78  BUN 37* 50*  CREATININE 6.37* 6.51*  CALCIUM 8.3* 8.2*  MG -- --  PHOS 2.9 4.9*    Basename 06/12/11 0510 06/11/11 0500  AST 80* 108*  ALT 95* 142*  ALKPHOS 67 82  BILITOT 0.9 1.5*  PROT 6.1 7.2  ALBUMIN 2.1* 2.4*    Basename 06/12/11 0510 06/11/11 0500 06/10/11 0800  WBC 5.9 9.6 6.1  NEUTROABS -- -- --  HGB 8.8* 9.9* 9.3*  HCT 26.2* 29.5* 28.2*  MCV 91.3 90.8 91.9  PLT 71* 95* 77*   Micro  Results: Recent Results (from the past 240 hour(s))  MRSA PCR SCREENING     Status: Normal   Collection Time   06/07/11 11:32 PM      Component Value Range Status Comment   MRSA by PCR NEGATIVE  NEGATIVE  Final     Studies/Results: All recent x-ray/radiology reports have been reviewed in detail.   Medications: I have reviewed the patient's complete medication list.  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  Antral, pyloric, and duodenal ulcers Likely source of acute/subacute bleeding - tx as per GI reccs - f/u bx results  Continue proton pump inhibitor Continue Carafate but limit duration 2/2 to decreased clearance of this medication in a dialysis patient  Cholelithiasis and cholecystitis with obstruction/Transaminitis HIDA scan without evidence of acute choly-LFTs improving-  repeat CMET in the morning-Gen Surg has signed off  Gallstone pancreatitis Much improved - suspect was transient and mild -peak lipase 120-now has normalized - tolerating diet thus far  Hepatitis B surface antibody positive Most c/w immunization or recovery from and subsequent immunity to prior Hep B infection  End-stage renal disease (T,Th, Sat HD) HD as per Nephrology   Hypothyroidism Remains on replacement tx  Disposition Transfer to [address]  Junious Silk, ANP [facility] Office  [phone] Pager [phone]  On-Call/Text  Page:      [email or web address]      password [email or web address]  I have personally examined this patient and reviewed the entire database. I have reviewed the above note, made any necessary editorial changes, and agree with its content.  Lonia Blood, MD [facility]

## 2011-06-12 NOTE — Progress Notes (Signed)
3 Days Post-Op  Subjective: Pt ok. Still c/o "a little pain" in upper abd/RUQ. No N/v and tol reg diet well.  Objective: Vital signs in last 24 hours: Temp:  [97.5 F (36.4 C)-99.3 F (37.4 C)] 98.4 F (36.9 C) (02/25 0700) Pulse Rate:  [88-94] 88  (02/25 0800) Resp:  [13-18] 13  (02/25 0800) BP: (87-102)/(47-60) 96/49 mmHg (02/25 0800) SpO2:  [98 %-100 %] 100 % (02/25 0800) Weight:  [74 kg (163 lb 2.3 oz)] 74 kg (163 lb 2.3 oz) (02/25 0500) Last BM Date: 06/07/11  Intake/Output this shift: Total I/O In: 350 [P.O.:350] Out: -   Physical Exam: BP 96/49  Pulse 88  Temp(Src) 98.4 F (36.9 C) (Oral)  Resp 13  Ht 5\' 4"  (1.626 m)  Wt 74 kg (163 lb 2.3 oz)  BMI 28.00 kg/m2  SpO2 100% Abdomen: soft, ND, mild tender RUQ and mid abd No masses  Labs: CBC  Basename 06/12/11 0510 06/11/11 0500  WBC 5.9 9.6  HGB 8.8* 9.9*  HCT 26.2* 29.5*  PLT 71* 95*   BMET  Basename 06/12/11 0510 06/10/11 0430  NA 125* 128*  K 3.6 3.7  CL 88* 90*  CO2 24 24  GLUCOSE 104* 78  BUN 37* 50*  CREATININE 6.37* 6.51*  CALCIUM 8.3* 8.2*   LFT  Basename 06/12/11 0510 06/11/11 0500  PROT 6.1 --  ALBUMIN 2.1* --  AST 80* --  ALT 95* --  ALKPHOS 67 --  BILITOT 0.9 --  BILIDIR -- 0.7*  IBILI -- 0.8  LIPASE -- 35   PT/INR No results found for this basename: LABPROT:2,INR:2 in the last 72 hours ABG No results found for this basename: PHART:2,PCO2:2,PO2:2,HCO3:2 in the last 72 hours  Studies/Results: Nm Hepatobiliary Liver Func  06/10/2011  *RADIOLOGY REPORT*  Clinical Data:  Right upper quadrant pain.  NUCLEAR MEDICINE HEPATOBILIARY IMAGING  Technique:  Sequential images of the abdomen were obtained out to 60 minutes following intravenous administration of radiopharmaceutical.  Radiopharmaceutical:  5.45mCi Tc-31m Choletec  Comparison:  None.  Findings: Prompt radiopharmaceutical uptake by the liver is seen. The liver is normal and appearance.  Prompt biliary excretion activity is  noted.  Biliary activity is initially seen within the gallbladder on the 25-minute image. Biliary activity reaches the small bowel by 30 minutes.  IMPRESSION: Negative.  No evidence of cystic duct or biliary obstruction.  Original Report Authenticated By: Danae Orleans, M.D.    Assessment: Principal Problem:  *GI bleed Active Problems:  ESRD on hemodialysis  HTN (hypertension)  Shoulder pain  RUQ abdominal tenderness  Anemia in chronic kidney disease (CKD)  Acute blood loss anemia  Transaminitis  Cholelithiasis, no cholecystitis by HIDA  Hepatitis B antibody positive   Procedure(s): ESOPHAGOGASTRODUODENOSCOPY (EGD)  Plan: LFTs cont to improve. On reg/renal diet  LOS: 6 days    Marianna Fuss PA-C 06/12/2011 11:00 AM

## 2011-06-13 LAB — COMPREHENSIVE METABOLIC PANEL
Albumin: 2.2 g/dL — ABNORMAL LOW (ref 3.5–5.2)
Alkaline Phosphatase: 72 U/L (ref 39–117)
BUN: 52 mg/dL — ABNORMAL HIGH (ref 6–23)
CO2: 24 mEq/L (ref 19–32)
Chloride: 83 mEq/L — ABNORMAL LOW (ref 96–112)
GFR calc Af Amer: 5 mL/min — ABNORMAL LOW (ref >90)
GFR calc non Af Amer: 4 mL/min — ABNORMAL LOW (ref >90)
Glucose, Bld: 85 mg/dL (ref 70–99)
Potassium: 4.1 mEq/L (ref 3.5–5.1)
Total Bilirubin: 1 mg/dL (ref 0.3–1.2)

## 2011-06-13 LAB — CBC
HCT: 26.9 % — ABNORMAL LOW (ref 36.0–46.0)
Hemoglobin: 8.9 g/dL — ABNORMAL LOW (ref 12.0–15.0)
RBC: 2.99 MIL/uL — ABNORMAL LOW (ref 3.87–5.11)
WBC: 6.5 10*3/uL (ref 4.0–10.5)

## 2011-06-13 MED ORDER — DARBEPOETIN ALFA-POLYSORBATE 100 MCG/0.5ML IJ SOLN: 100.0000 ug | INTRAMUSCULAR | Status: DC

## 2011-06-13 MED ORDER — PANTOPRAZOLE SODIUM 40 MG PO TBEC: 40.0000 mg | DELAYED_RELEASE_TABLET | Freq: Two times a day (BID) | ORAL | Status: DC

## 2011-06-13 MED ORDER — RENA-VITE PO TABS
1.0000 | ORAL_TABLET | Freq: Every day | ORAL | Status: DC
  Administered 2011-06-13: 1 via ORAL
  Filled 2011-06-13 (×2): qty 1

## 2011-06-13 MED ORDER — OXYCODONE HCL 5 MG PO TABS: 5.0000 mg | ORAL_TABLET | ORAL | Status: AC | PRN

## 2011-06-13 MED ORDER — SUCRALFATE 1 G PO TABS: 1.0000 g | ORAL_TABLET | Freq: Three times a day (TID) | ORAL | Status: DC

## 2011-06-13 NOTE — Progress Notes (Signed)
[facility] [place] TEAM 8  Subjective: Samantha [name] is a 66 y.o. female with history of end-stage renal disease on hemodialysis (Monday, Wednesday, Friday) and hypertension who presented to the emergency room with a two-day history of diffuse abdominal pain, coffee-ground emesis, and a bit of melanotic stool.  Evaluation in the emergency room included a hemoglobin of 9.5.    Continues to complain of abdominal pain.  Objective: Weight change: 2 kg (4 lb 6.5 oz)  Intake/Output Summary (Last 24 hours) at 06/13/11 1848 Last data filed at 06/13/11 1816  Gross per 24 hour  Intake   1674 ml  Output      0 ml  Net   1674 ml   Blood pressure 126/79, pulse 82, temperature 98.7 F (37.1 C), temperature source Oral, resp. rate 18, height 5\' 1"  (1.549 m), weight 74 kg (163 lb 2.3 oz), SpO2 100.00%.  Physical Exam: General: No acute respiratory distress Lungs: Clear to auscultation bilaterally without wheezes or crackles Cardiovascular: Regular rate and rhythm without murmur gallop or rub  Abdomen: nondistended, tender to palpation in the right upper quadrant, soft, bowel sounds positive, no rebound, no ascites - no appreciable mass Extremities: No significant cyanosis, clubbing, or edema bilateral lower extremities  Lab Results:  Basename 06/13/11 0600 06/12/11 0510  NA 120* 125*  K 4.1 3.6  CL 83* 88*  CO2 24 24  GLUCOSE 85 104*  BUN 52* 37*  CREATININE 8.19* 6.37*  CALCIUM 8.9 8.3*  MG -- --  PHOS -- 2.9    Basename 06/13/11 0600 06/12/11 0510  AST 68* 80*  ALT 72* 95*  ALKPHOS 72 67  BILITOT 1.0 0.9  PROT 6.3 6.1  ALBUMIN 2.2* 2.1*    Basename 06/13/11 0600 06/12/11 0510 06/11/11 0500  WBC 6.5 5.9 9.6  NEUTROABS -- -- --  HGB 8.9* 8.8* 9.9*  HCT 26.9* 26.2* 29.5*  MCV 90.0 91.3 90.8  PLT 86* 71* 95*   Micro Results: Recent Results (from the past 240 hour(s))  MRSA PCR SCREENING     Status: Normal   Collection Time   06/07/11 11:32 PM   Component Value Range Status Comment   MRSA by PCR NEGATIVE  NEGATIVE  Final     Studies/Results: All recent x-ray/radiology reports have been reviewed in detail.   Medications: I have reviewed the patient's complete medication list.  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 She can discharge tomorrow after dialysis.   Calvert Cantor, MD [phone]

## 2011-06-13 NOTE — Discharge Instructions (Signed)
Cirrhosis Cirrhosis is a condition of scarring of the liver which is caused when the liver has tried repairing itself following damage. This damage may come from a previous infection such as one of the forms of hepatitis (usually hepatitis C), or the damage may come from being injured by toxins. The main toxin that causes this damage is alcohol. The scarring of the liver from use of alcohol is irreversible. That means the liver cannot return to normal even though alcohol is not used any more. The main danger of hepatitis C infection is that it may cause long-lasting (chronic) liver disease, and this also may lead to cirrhosis. This complication is progressive and irreversible. CAUSES  Prior to available blood tests, hepatitis C could be contracted by blood transfusions. Since testing of blood has improved, this is now unlikely. This infection can also be contracted through intravenous drug use and the sharing of needles. It can also be contracted through sexual relationships. The injury caused by alcohol comes from too much use. It is not a few drinks that poison the liver, but years of misuse. Usually there will be some signs and symptoms early with scarring of the liver that suggest the development of better habits. Alcohol should never be used while using acetaminophen. A small dose of both taken together may cause irreversible damage to the liver. HOME CARE INSTRUCTIONS  There is no specific treatment for cirrhosis. However, there are things you can do to avoid making the condition worse.  Rest as needed.   Eat a well-balanced diet. Your caregiver can help you with suggestions.   Vitamin supplements including vitamins A, K, D, and thiamine can help.   A low-salt diet, water restriction, or diuretic medicine may be needed to reduce fluid retention.   Avoid alcohol. This can be extremely toxic if combined with acetaminophen.   Avoid drugs which are toxic to the liver. Some of these include  isoniazid, methyldopa, acetaminophen, anabolic steroids (muscle-building drugs), erythromycin, and oral contraceptives (birth control pills). Check with your caregiver to make sure medicines you are presently taking will not be harmful.   Periodic blood tests may be required. Follow your caregiver's advice regarding the timing of these.   Milk thistle is an herbal remedy which does protect the liver against toxins. However, it will not help once the liver has been scarred.  SEEK MEDICAL CARE IF:  You have increasing fatigue or weakness.   You develop swelling of the hands, feet, legs, or face.   You vomit bright red blood, or a coffee ground appearing material.   You have blood in your stools, or the stools turn black and tarry.   You have a fever.   You develop loss of appetite, or have nausea and vomiting.   You develop jaundice.   You develop easy bruising or bleeding.   You have worsening of any of the problems you are concerned about.  Document Released: 04/03/2005 Document Revised: 12/14/2010 Document Reviewed: 11/20/2007 [facility] Patient Information 2012 [place], [place].Gallbladder Disease Gallbladder disease (cholecystitis) is an inflammation of your gallbladder. It is usually caused by a build-up of stones (gallstones) or sludge (cholelithiasis) in your gallbladder. The gallbladder is not an essential organ. It is located slightly to the right of center in the belly (abdomen), behind the liver. It stores bile made in the liver. Bile aids in digestion of fats. Gallbladder disease may result in nausea (feeling sick to your stomach), abdominal pain, and jaundice. In severe cases, emergency surgery may be  required. The most common type of gallbladder disease is gallstones. They begin as small crystals and slowly grow into stones. Gallstone pain occurs when the bile duct has spasms. The spasms are caused by the stone passing out of the duct. The stone is trying to pass at the same time  bile is passing into the small bowel for digestion. The pain usually begins suddenly. It may persist from several minutes to several hours. Infection can occur. Infection can add to discomfort and severity of an acute attack. The pain may be made worse by breathing deeply or by being jarred. There may be fever and tenderness to the touch. In some cases, when gallstones do not move into the bile duct, people have no pain or symptoms. These are called "silent" gallstones. Women are three times more likely to develop gallstones than men. Women who have had several pregnancies are more likely to have gallbladder disease. Physicians sometimes advise removing diseased gallbladders before future pregnancies. Other factors that increase the risk of gallbladder disease are obesity, diets heavy in fried foods and dairy products, increasing age, prolonged use of medications containing female hormones, and heredity. HOME CARE INSTRUCTIONS   If your physician prescribed an antibiotic, take as directed.   Only take over-the-counter or prescription medicines for pain, discomfort, or fever as directed by your caregiver.   Follow a low fat diet until seen again. (Fat causes the gallbladder to contract.)   Follow-up as instructed. Attacks are almost always recurrent and surgery is usually required for permanent treatment.  SEEK IMMEDIATE MEDICAL CARE IF:   Pain is increasing and not controlled by medications.   The pain moves to another part of your abdomen or to your back. (Right sided pain can be appendicitis and left sided pain in adults can be diverticulitis).   You have a fever.   You develop nausea and vomiting.  Document Released: 04/03/2005 Document Revised: 12/14/2010 Document Reviewed: 11/21/2007 [facility] Patient Information 2012 [place], [place].Ulcers of the Gastrointestinal Tract You have an ulcer or are likely to get ulcers more often than most people. An ulcer is a break or hole in the lining of  the esophagus (food tube from the mouth to the stomach), stomach, or the first part of the small bowel. CAUSES   Germs (bacteria). There is a bacterium related to ulcers called Helicobacter Pylori.   Medications such as the nonsteroidal anti-inflammatory medications.   Cigarette smoking is related to ulcers and it does not help them heal.  SYMPTOMS   Burning or gnawing of the mid upper belly (abdomen). This is usually relieved with food or antacids.   Feeling sick to your stomach (nausea).   Bloating.   Vomiting.   If the ulcer results in bleeding, it can cause:   Black tarry stools.   Vomiting of bright red blood.   With severe bleeding, there may be:   Loss of consciousness and shock.   Vomiting coffee ground looking materials.  DIAGNOSIS  Learning what is wrong (diagnosis) is usually made with x-rays (barium studies) and upper GI (gastrointestinal) endoscopy. With endoscopy, a flexible tube is used to look at the esophagus, stomach, and small bowel. Abnormal areas may be biopsied. This is when a small piece of tissue is removed to look at under a microscope. In young people, it is safe to treat without studies (barium x-rays, for example) and to use diagnostic studies on those who do not respond. TREATMENT   Avoid tobacco, alcohol, and foods that seem to  make your pain worse. Tobacco use will slow the healing process.   Take other medications as directed. Your caregiver may prescribe medications known as H2 blockers that cut down on the production of acid. Other medications are available that protect the lining of the bowel.   Continue regular work and usual activities unless told otherwise by your caregiver.   If you failed to respond to the usual ulcer treatments, ask your caregiver if antibiotics are a consideration. These are medications that kill germs.  SEEK IMMEDIATE MEDICAL CARE IF:  You develop:  Bright red bleeding.   Vomit blood.   Become light-headed,  weak.   Have fainting episodes.   Become sweaty, cold and clammy.   You have severe abdominal pain not controlled by medications. Do not take pain medications unless told by your caregiver.  Document Released: 08/24/2000 Document Revised: 12/14/2010 Document Reviewed: 01/11/2005 [facility] Patient Information 2012 [place], [place].Gastrointestinal Bleeding Gastrointestinal (GI) bleeding is bleeding from the gut or any place between your mouth and anus. If bleeding is slow, you may be allowed to go home. If there is a lot of bleeding, hospitalization and observation are often required. SYMPTOMS   You vomit bright red blood or material that looks like coffee grounds.   You have blood in your stools or the stools look black and tarry.  DIAGNOSIS  Your caregiver may diagnose your condition by taking a history and a physical exam. More tests may be needed, including:  X-rays.   EGD (esophagogastroduodenoscopy), which looks at your esophagus, stomach, and small bowel through a flexible telescope-like instrument.   Colonoscopy, which looks at your colon/large bowel through a flexible telescope-like instrument.   Biopsies, which remove a small sample of tissue to examine under a microscope.  Finding out the results of your test Not all test results are available during your visit. If your test results are not back during the visit, make an appointment with your caregiver to find out the results. Do not assume everything is normal if you have not heard from your caregiver or the medical facility. It is important for you to follow up on all of your test results. HOME CARE INSTRUCTIONS   Follow instructions as suggested by your caregiver regarding medicines. Do not take aspirin, drink alcohol, or take medicines for pain and arthritis unless your caregiver says it is okay.   Get the suggested follow-up care when the tests are done.  SEEK IMMEDIATE MEDICAL CARE IF:   Your bleeding increases or you  become lightheaded, weak, or pass out (faint).   You experience severe cramps in your stomach, back, or belly (abdomen).   You pass large clots.   The problems which brought you in for medical care get worse.  MAKE SURE YOU:   Understand these instructions.   Will watch your condition.   Will get help right away if you are not doing well or get worse.  Document Released: 03/31/2000 Document Revised: 12/14/2010 Document Reviewed: 11/21/2007 [facility] Patient Information 2012 [place], [place].

## 2011-06-13 NOTE — Progress Notes (Signed)
[facility] Progress Note Subjective:  Feels better; cold in room, just took bath.  Still with some generalized upper abdominal pain.  Objective Filed Vitals:   06/12/11 1652 06/12/11 2226 06/13/11 0458 06/13/11 0900  BP: 113/76 104/69 97/62 101/65  Pulse: 90 86 83 88  Temp: 98.3 F (36.8 C) 98.9 F (37.2 C) 98.1 F (36.7 C) 98.2 F (36.8 C)  TempSrc: Oral Oral Oral Oral  Resp: 20 18 18 18   Height: 5\' 1"  (1.549 m)     Weight: 76 kg (167 lb 8.8 oz) 74 kg (163 lb 2.3 oz)    SpO2: 98% 98% 99% 99%   Physical Exam: General: NAD Heart: RRR Lungs:CTAB Abdomen: distended + BS, right UQ tenderness Extremities: SCDS, + LE edema Dialysis Access: left upper AVGG + bruit  Problem/Plan: 1. UGI bleed. Hgb drifting down post PRBC (but has some vol overload). 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 On protonix and carafate. Since carafate contains aluminum, she shouldn't be on > 1 MONTH; recheck in am 2. 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 +gallstones. Diet advanced to renal.  3. ESRD, HD TTS- at 74 kg is above EDW (69.5) with leg edema, HD today, try to get close to EDW, BP may not allow. No heparin due to GIB.  4. High BP--101/52 now on no BP meds here, no BP meds on home med list either.  5. Anemia - Hgb declining, ferritin high--received a once dose of 200 Aranesp on 2/21; due for 200 on 2/28; Epo 5400 PTA; ferritin 1887, check Fe and TIBC 6. 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 Continue Zemplar and phos binder once taking po. PTH 225 on 10 May 2011; missing outpt mo labs, check iPTH 7. Nutrition--alb 2.4, taking po - change to high protein renal diet; add renavite 8. 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 LFTs better-LFTs declining. Dr. Elnoria Howard following.  9. Hyponatremia- developed in hospital. Limit fluids, should correct with serial HD to lower volume; recheck in AM  Additional Objective Labs: Basic Metabolic Panel:  Lab  06/13/11 0600 06/12/11 0510 06/10/11 0430 06/08/11 0916  NA 120* 125* 128* --  K 4.1 3.6 3.7 --  CL 83* 88* 90* --  CO2 24 24 24  --  GLUCOSE 85 104* 78 --  BUN 52* 37* 50* --  CREATININE 8.19* 6.37* 6.51* --  CALCIUM 8.9 8.3* 8.2* --  ALB -- -- -- --  PHOS -- 2.9 4.9* 6.9*   Liver Function Tests:  Lab 06/13/11 0600 06/12/11 0510 06/11/11 0500  AST 68* 80* 108*  ALT 72* 95* 142*  ALKPHOS 72 67 82  BILITOT 1.0 0.9 1.5*  PROT 6.3 6.1 7.2  ALBUMIN 2.2* 2.1* 2.4*    Lab 06/11/11 0500 06/09/11 0350 06/08/11 0916  LIPASE 35 80* 117*  AMYLASE -- -- --  CBC:  Lab 06/13/11 0600 06/12/11 0510 06/11/11 0500 06/10/11 0800 06/10/11 0430  WBC 6.5 5.9 9.6 -- --  NEUTROABS -- -- -- -- --  HGB 8.9* 8.8* 9.9* -- --  HCT 26.9* 26.2* 29.5* -- --  MCV 90.0 91.3 90.8 91.9 91.2  PLT 86* 71* 95* -- --  CBG:  Lab 06/09/11 0918 06/07/11 1728 06/07/11 1710  GLUCAP 86 99 96  Medications:      . calcium acetate  1,334 mg Oral TID WC  . feeding supplement  1 Container Oral TID WC  . levothyroxine  100 mcg Oral BH-q7a  . pantoprazole  40 mg Oral BID AC  . paricalcitol  1 mcg Intravenous Q T,Th,Sa-HD  . sodium chloride  3 mL Intravenous Q12H  . sucralfate  1 g Oral TID WC & HS  . DISCONTD: paricalcitol  1 mcg Intravenous 3 times weekly    I  have reviewed scheduled and prn medications.  Sheffield Slider, PA-C [facility] Beeper [phone]  06/13/2011,11:24 AM  LOS: 7 days   Patient seen and examined and agree with assessment and plan as above.  Looks like patient may be discharged today. Agree with A/P as above.  Vinson Moselle  MD [employer] [phone] pgr    [phone] cell 06/13/2011, 4:23 PM

## 2011-06-13 NOTE — Discharge Summary (Addendum)
DISCHARGE SUMMARY  Samantha [name]  MR#: [id number]  DOB:1945/06/02  Date of Admission: 06/06/2011 Date of Discharge: 06/14/2011  Attending Physician:Saima Butler Denmark, MD  Patient's PCP:No primary provider on file.  Consults:  Zada Girt, MD-nephrology  Lodema Pilot, DO-Gen. Surgery Charna Elizabeth, M.D.-gastroenterology  Pertinent followup issues: *Aspirin 81 mg was discontinued this admission because of multiple ulcers. 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 Gastroenterology team feels that based on the patient's age and significant morbidity no further workup necessary. *Is to only use Carafate for one month due to this medicine contains aluminum and can be harmful and accumulate in the dialysis patient *Is to remain on proton pump inhibitor daily indefinitely *Recommend followup CBC in one week   Discharge Diagnoses: Principal Problem:  *GI bleed Active Problems:  Anemia in chronic kidney disease (CKD)  Acute blood loss anemia  RUQ abdominal tenderness  Transaminitis  Cholelithiasis and cholecystitis with obstruction  ESRD on hemodialysis  HTN (hypertension)  Hepatitis B antibody positive  Shoulder pain   Radiology: Nm Hepatobiliary Liver Func  06/10/2011  *RADIOLOGY REPORT*  Clinical Data:  Right upper quadrant pain.  NUCLEAR MEDICINE HEPATOBILIARY IMAGING  Technique:  Sequential images of the abdomen were obtained out to 60 minutes following intravenous administration of radiopharmaceutical.  Radiopharmaceutical:  5.90mCi Tc-19m Choletec  Comparison:  None.  Findings: Prompt radiopharmaceutical uptake by the liver is seen. The liver is normal and appearance.  Prompt biliary excretion activity is noted.  Biliary activity is initially seen within the gallbladder on the 25-minute image. Biliary activity reaches the small bowel by 30 minutes.   IMPRESSION: Negative.  No evidence of cystic duct or biliary obstruction.  Original Report Authenticated By: Danae Orleans, M.D.   US Abdomen Complete  06/07/2011  *RADIOLOGY REPORT*  Clinical Data:  Abdominal pain; elevated LFTs.  ABDOMINAL ULTRASOUND COMPLETE  Comparison:  Abdominal radiograph performed earlier today at 08:24 p.m.  Findings:  Gallbladder:  There appears to be a 1.2 cm stone lodged at the neck of the gallbladder.  The gallbladder wall is mildly thickened and edematous, with suggestion of trace associated pericholecystic fluid.  A positive ultrasonographic Murphy's sign is seen. Findings raise concern for mild acute cholecystitis.  Common Bile Duct:  0.5 cm in diameter; within normal limits in caliber.  Liver:  Heterogeneous echotexture, with a mildly nodular contour, raising question for mild cirrhotic change; no focal lesions identified.  Limited Doppler evaluation demonstrates normal blood flow within the liver.  IVC:  Unremarkable in appearance.  Pancreas:  Although the pancreas is difficult to visualize in its entirety due to overlying bowel gas, no focal pancreatic abnormality is identified.  Spleen:  10.2 cm in length; within normal limits in size and echotexture.  Right kidney:  5.7 cm in length; markedly diminutive, with diffusely increased cortical echogenicity, reflecting the patient's chronic renal atrophy.  A few small cysts are suggested.  No suspicious mass seen; no evidence of hydronephrosis.  Left kidney:  5.5 cm in length; markedly diminutive, with diffusely increased cortical echogenicity, reflecting the patient's chronic renal atrophy.  A few small cysts are noted.  No suspicious mass seen; no evidence of hydronephrosis.  Abdominal Aorta:  Normal in caliber; no aneurysm identified.  The aortic bifurcation is not visualized due to overlying bowel gas.  IMPRESSION:  1.  Suspect mild acute cholecystitis, with a 1.2 cm stone lodged at the neck of  the gallbladder, and mild  gallbladder wall thickening and edema, with suggestion of trace pericholecystic fluid. Positive ultrasonographic Murphy's sign elicited.  No evidence of biliary duct dilatation to suggest more distal obstruction. 2.  Mildly nodular contour of the liver raises question for mild cirrhotic change. 3.  Chronic bilateral renal atrophy noted, with few small scattered cysts seen.  Original Report Authenticated By: Tonia Ghent, M.D.   Dg Abd Acute W/chest  06/06/2011  *RADIOLOGY REPORT*  Clinical Data: 66 year old female with abdominal pain, shortness of breath, nausea and vomiting.  ACUTE ABDOMEN SERIES (ABDOMEN 2 VIEW & CHEST 1 VIEW)  Comparison: None.  Findings: Postoperative changes to the right glenohumeral joint. Cardiac size and mediastinal contours are within normal limits. Visualized tracheal air column is within normal limits.  No pneumothorax or pneumoperitoneum.  No pulmonary edema, pleural effusion or consolidation.  Nonobstructed bowel gas pattern. No acute osseous abnormality identified.  IMPRESSION: Nonobstructed bowel gas pattern, no free air. No acute cardiopulmonary abnormality.  Original Report Authenticated By: Harley Hallmark, M.D.    Laboratory: Results for orders placed during the hospital encounter of 06/06/11 (from the past 48 hour(s))  COMPREHENSIVE METABOLIC PANEL     Status: Abnormal   Collection Time   06/12/11  5:10 AM      Component Value Range Comment   Sodium 125 (*) 135 - 145 (mEq/L)    Potassium 3.6  3.5 - 5.1 (mEq/L)    Chloride 88 (*) 96 - 112 (mEq/L)    CO2 24  19 - 32 (mEq/L)    Glucose, Bld 104 (*) 70 - 99 (mg/dL)    BUN 37 (*) 6 - 23 (mg/dL)    Creatinine, Ser 7.82 (*) 0.50 - 1.10 (mg/dL)    Calcium 8.3 (*) 8.4 - 10.5 (mg/dL)    Total Protein 6.1  6.0 - 8.3 (g/dL)    Albumin 2.1 (*) 3.5 - 5.2 (g/dL)    AST 80 (*) 0 - 37 (U/L)    ALT 95 (*) 0 - 35 (U/L)    Alkaline Phosphatase 67  39 - 117 (U/L)    Total Bilirubin 0.9  0.3 - 1.2 (mg/dL)    GFR calc non Af  Amer 6 (*) >90 (mL/min)    GFR calc Af Amer 7 (*) >90 (mL/min)   PHOSPHORUS     Status: Normal   Collection Time   06/12/11  5:10 AM      Component Value Range Comment   Phosphorus 2.9  2.3 - 4.6 (mg/dL)   CBC     Status: Abnormal   Collection Time   06/12/11  5:10 AM      Component Value Range Comment   WBC 5.9  4.0 - 10.5 (K/uL)    RBC 2.87 (*) 3.87 - 5.11 (MIL/uL)    Hemoglobin 8.8 (*) 12.0 - 15.0 (g/dL)    HCT 95.6 (*) 21.3 - 46.0 (%)    MCV 91.3  78.0 - 100.0 (fL)    MCH 30.7  26.0 - 34.0 (pg)    MCHC 33.6  30.0 - 36.0 (g/dL)    RDW 08.6 (*) 57.8 - 15.5 (%)    Platelets 71 (*) 150 - 400 (K/uL) CONSISTENT WITH PREVIOUS RESULT  CBC     Status: Abnormal   Collection Time   06/13/11  6:00 AM      Component Value Range Comment   WBC 6.5  4.0 - 10.5 (K/uL)    RBC 2.99 (*) 3.87 - 5.11 (MIL/uL)  Hemoglobin 8.9 (*) 12.0 - 15.0 (g/dL)    HCT 40.9 (*) 81.1 - 46.0 (%)    MCV 90.0  78.0 - 100.0 (fL)    MCH 29.8  26.0 - 34.0 (pg)    MCHC 33.1  30.0 - 36.0 (g/dL)    RDW 91.4 (*) 78.2 - 15.5 (%)    Platelets 86 (*) 150 - 400 (K/uL) CONSISTENT WITH PREVIOUS RESULT  COMPREHENSIVE METABOLIC PANEL     Status: Abnormal   Collection Time   06/13/11  6:00 AM      Component Value Range Comment   Sodium 120 (*) 135 - 145 (mEq/L)    Potassium 4.1  3.5 - 5.1 (mEq/L)    Chloride 83 (*) 96 - 112 (mEq/L)    CO2 24  19 - 32 (mEq/L)    Glucose, Bld 85  70 - 99 (mg/dL)    BUN 52 (*) 6 - 23 (mg/dL)    Creatinine, Ser 9.56 (*) 0.50 - 1.10 (mg/dL)    Calcium 8.9  8.4 - 10.5 (mg/dL)    Total Protein 6.3  6.0 - 8.3 (g/dL)    Albumin 2.2 (*) 3.5 - 5.2 (g/dL)    AST 68 (*) 0 - 37 (U/L)    ALT 72 (*) 0 - 35 (U/L)    Alkaline Phosphatase 72  39 - 117 (U/L)    Total Bilirubin 1.0  0.3 - 1.2 (mg/dL)    GFR calc non Af Amer 4 (*) >90 (mL/min)    GFR calc Af Amer 5 (*) >90 (mL/min)      Medication List  As of 06/13/2011  1:07 PM   STOP taking these medications         aspirin EC 81 MG tablet           TAKE these medications         calcium acetate 667 MG capsule   Commonly known as: PHOSLO   Take 1,334 mg by mouth 3 (three) times daily with meals.      HYDROcodone-acetaminophen 5-325 MG per tablet   Commonly known as: NORCO   Take 1 tablet by mouth every 6 (six) hours as needed. pain      levothyroxine 100 MCG tablet   Commonly known as: SYNTHROID, LEVOTHROID   Take 100 mcg by mouth daily.      mulitivitamin with minerals Tabs   Take 1 tablet by mouth daily.      oxyCODONE 5 MG immediate release tablet   Commonly known as: Oxy IR/ROXICODONE   Take 1-2 tablets (5-10 mg total) by mouth every 4 (four) hours as needed for pain (Use only for pain not controlled by Ultram/tramadol).      pantoprazole 40 MG tablet   Commonly known as: PROTONIX   Take 1 tablet (40 mg total) by mouth 2 (two) times daily before a meal.      sucralfate 1 G tablet   Commonly known as: CARAFATE   Take 1 tablet (1 g total) by mouth 4 (four) times daily -  with meals and at bedtime.      traMADol 50 MG tablet   Commonly known as: ULTRAM   Take 50 mg by mouth every 6 (six) hours as needed. pain            History of present illness: 66 year old dialysis patient presented to the emergency department with a two-day history of diffuse abdominal pain associated with coffee ground emesis and apparent melanotic stool. Lucila Maine was with  the patient during the initial evaluation and endorses the patient has intermittently taken ibuprofen over the years for various pain. She has also been on aspirin. No other GI symptoms reported. Hemoglobin in the emergency department was found to be 9.5. A new finding of significant transaminitis was noted. Plain abdominal films were negative. INR normal. Fecal occult blood was positive  Hospital Course: Principal Problem:  *GI bleed Active Problems:  Anemia in chronic kidney disease (CKD)  Acute blood loss anemia  RUQ abdominal tenderness  Transaminitis   Cholelithiasis and cholecystitis with obstruction  ESRD on hemodialysis  HTN (hypertension)  Hepatitis B antibody positive  Shoulder pain    Anemia due to acute blood loss and chronic anemia due to renal disease  After admission patient's hemoglobin dipped to 5.0 and subsequently she was given 2 units of packed red blood cells. The following day the hemoglobin had increased to 9.2. Suspect that the reading of 5.0 was spurious. Nonetheless given her presenting symptoms and heme positive status gastric urology was consulted. She subsequently underwent esophagogastroduodenoscopy on 06/09/2011 by Dr. Jeani Hawking. She was found to have medium-sized esophageal bases without stigmata of bleeding as well as antral, pyloric, and duodenal ulcers. 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 She had argument placed on hypertonic strip at admission and Protonix was continued throughout the hospitalization. She was also started on Carafate 3 times a day. Because this medication contains aluminum and this is a dialysis patient recommendation was to continue for a total of 30 days. Says currently patient has had no further sequelae of active bleeding or occult bleeding since admission. Hemoglobin has remained stable between the 8.9 and 9.9 range with lower readings pre-dialysis. Baby aspirin as well as all NSAIDs were discontinued this admission. She is to follow up as needed with gastroenterology team. She continues to endorse the epigastric and upper abdominal pain which is felt to be related to the underlying ulcer disease.  Cholelithiasis without acute cholecystitis/Transaminitis/gallstone pancreatitis After admission patient complained of significant right quadrant abdominal pain radiating to the back into the right lateral abdomen. Because of her transaminitis and concerns for possible acute cholecystitis surgery consultation was obtained. An abdominal ultrasound was suspicious for mild  acute cholecystitis with a 1.2 cm stone lodged at the neck of the gallbladder and mild gallbladder wall thickening and edema with a suggestion of trace pericholecystic fluid. In addition she had a positive ultrasonographic Murphy sign. There was no evidence of biliary ductal to patient to suggest a distal obstruction. Because of presenting symptoms with GI bleeding surgery was not convinced that patient's abdominal pain was primarily caused by cholecystitis therefore a HIDA scan was ordered. The HIDA scan was negative without evidence of cystic duct or biliary ductal obstruction and no evidence of acute cholecystitis. She was felt to be at high risk for an elective procedure because of her multiple comorbidities and during this hospitalization there was felt to be no role for acute surgical intervention. She also had evidence of mild biliary pancreatitis with a peak lipase of 120 which had normalized to 35 48 hours prior to discharge. Patient was tolerating diet without any postprandial increases in abdominal pain or nausea and vomiting. She is to followup as needed with general surgery.  Cirrhosis/Medium-sized esopahgeal varices without stigmata of bleeding/Portal hypertensive gastropathy The abdominal ultrasound performed this admission also revealed possible cirrhotic changes without any clear etiology. Labs were negative for hepatitis C or B. Gastroenterology team does not feel that any  further workup is necessitated. Transaminases have been decreasing since admission with most recent AST 68 after a peak of 516 and ALT 72 after a peak of 365. Total bilirubin is now normal at 1.0 with a peak of 1.5 this admission. Alkaline phosphatase has remained normal.  Hepatitis B surface antibody positive This is most consistent with immunization a recovery from and subsequent immunity to prior hepatitis B infection  End-stage renal disease (T,Th, Sat HD) Nephrology has been following this admission the patient has  tolerated hemodialysis well. On date of discharge prior to receiving hemodialysis treatment patient's sodium was 120 and it was felt this would be corrected with today's hemodialysis treatment.  Hypothyroidism No changes have been made in the patient's Synthroid this admission  Day of Discharge BP 101/65  Pulse 88  Temp(Src) 98.2 F (36.8 C) (Oral)  Resp 18  Ht 5\' 1"  (1.549 m)  Wt 74 kg (163 lb 2.3 oz)  BMI 30.83 kg/m2  SpO2 99%  Physical Exam:  General appearance: alert, cooperative, appears older than stated age and no distress; she is also very hard of hearing Resp: clear to auscultation bilaterally Cardio: regular rate and rhythm, S1, S2 normal, no murmur, click, rub or gallop GI: soft, non-tender; bowel sounds normal; no masses,  no organomegaly, still mildly tender in the upper abdominal regions which has been persistent since admission Extremities: extremities normal, atraumatic, no cyanosis or edema Neurologic: Grossly normal  Follow-up: She is to followup with [facility] for her usual dialysis treatments on Tuesdays Thursdays and Saturdays Recommend followup lab work including CBC in one week with scheduled dialysis  Disposition: She is to discharge home today accompanied by her family/grandson via private vehicle  Spoke via telephone with the patient's grandson Quinn Plowman. He was updated on clinical findings from this hospitalization, expectations are gradually improving abdominal pain and how to treat, and medications to avoid primarily NSAIDs and aspirin.   Junious Silk, ANP pager [phone]   I have examined the patient, reviewed the chart and discussed the plan with the patient and with Junious Silk, NP. I agree with the above discharge summary.   Calvert Cantor, MD [phone]

## 2011-06-13 NOTE — Progress Notes (Signed)
Patient ID: Samantha [name], female   DOB: Feb 29, 1946, 66 y.o.   MRN: [id number] Subjective: Feeling well.  No pain at this time.  Objective: Vital signs in last 24 hours: Temp:  [98.1 F (36.7 C)-98.9 F (37.2 C)] 98.1 F (36.7 C) (02/26 0458) Pulse Rate:  [83-93] 83  (02/26 0458) Resp:  [13-25] 18  (02/26 0458) BP: (84-113)/(49-76) 97/62 mmHg (02/26 0458) SpO2:  [97 %-100 %] 99 % (02/26 0458) Weight:  [74 kg (163 lb 2.3 oz)-76 kg (167 lb 8.8 oz)] 74 kg (163 lb 2.3 oz) (02/25 2226) Last BM Date: 06/09/11  Intake/Output from previous day: 02/25 0701 - 02/26 0700 In: 350 [P.O.:350] Out: -  Intake/Output this shift:    General appearance: alert and no distress GI: tender in the RUQ  Lab Results:  Basename 06/13/11 0600 06/12/11 0510 06/11/11 0500  WBC 6.5 5.9 9.6  HGB 8.9* 8.8* 9.9*  HCT 26.9* 26.2* 29.5*  PLT 86* 71* 95*   BMET  Basename 06/12/11 0510  NA 125*  K 3.6  CL 88*  CO2 24  GLUCOSE 104*  BUN 37*  CREATININE 6.37*  CALCIUM 8.3*   LFT  Basename 06/12/11 0510 06/11/11 0500  PROT 6.1 --  ALBUMIN 2.1* --  AST 80* --  ALT 95* --  ALKPHOS 67 --  BILITOT 0.9 --  BILIDIR -- 0.7*  IBILI -- 0.8   PT/INR No results found for this basename: LABPROT:2,INR:2 in the last 72 hours Hepatitis Panel  Basename 06/10/11 0800  HEPBSAG --  HCVAB NEGATIVE  HEPAIGM --  HEPBIGM --   C-Diff No results found for this basename: CDIFFTOX:3 in the last 72 hours Fecal Lactopherrin No results found for this basename: FECLLACTOFRN in the last 72 hours  Studies/Results: No results found.  Medications:  Scheduled:   . calcium acetate  1,334 mg Oral TID WC  . feeding supplement  1 Container Oral TID WC  . levothyroxine  100 mcg Oral BH-q7a  . pantoprazole  40 mg Oral BID AC  . paricalcitol  1 mcg Intravenous Q T,Th,Sa-HD  . sodium chloride  3 mL Intravenous Q12H  . sucralfate  1 g Oral TID WC & HS  . DISCONTD: paricalcitol  1 mcg Intravenous 3 times weekly    Continuous:   Assessment/Plan: 1) Gastric and duodenal ulcers. 2) Cirrhosis   Without palpation she does not have any pain.  The pain may be coming from the ulcerations.  Her pain has improved since her admission.  As for her cirrhosis, given her age and significant comorbidities, I do not feel that any further work up is necessitated.  Since she has medium-sized varices and no overt findings of bleeding from varices no beta-blocker prophylaxis is required.  Plan: 1) Continue with PPI and sucralfate combination x 1 month then PPI QD indefinitely. 2) Signing off.  LOS: 7 days   Jontrell Bushong D 06/13/2011, 6:56 AM

## 2011-06-13 NOTE — Progress Notes (Signed)
Pt's d/c was canceled d/t not being able to have dialysis today. Pt's family was called and a message was left with her grandson. Will f/u

## 2011-06-14 ENCOUNTER — Inpatient Hospital Stay (HOSPITAL_COMMUNITY): Payer: Medicare (Managed Care)

## 2011-06-14 LAB — BASIC METABOLIC PANEL
BUN: 26 mg/dL — ABNORMAL HIGH (ref 6–23)
CO2: 23 mEq/L (ref 19–32)
Calcium: 8.6 mg/dL (ref 8.4–10.5)
Chloride: 95 mEq/L — ABNORMAL LOW (ref 96–112)
Creatinine, Ser: 5.51 mg/dL — ABNORMAL HIGH (ref 0.50–1.10)
GFR calc Af Amer: 8 mL/min — ABNORMAL LOW (ref >90)
GFR calc non Af Amer: 7 mL/min — ABNORMAL LOW (ref >90)
Glucose, Bld: 105 mg/dL — ABNORMAL HIGH (ref 70–99)
Potassium: 4.8 mEq/L (ref 3.5–5.1)
Sodium: 131 mEq/L — ABNORMAL LOW (ref 135–145)

## 2011-06-14 LAB — RENAL FUNCTION PANEL
CO2: 23 mEq/L (ref 19–32)
Calcium: 8.8 mg/dL (ref 8.4–10.5)
GFR calc Af Amer: 4 mL/min — ABNORMAL LOW (ref >90)
GFR calc non Af Amer: 4 mL/min — ABNORMAL LOW (ref >90)
Phosphorus: 2.8 mg/dL (ref 2.3–4.6)
Sodium: 118 mEq/L — CL (ref 135–145)

## 2011-06-14 LAB — CBC
HCT: 30.6 % — ABNORMAL LOW (ref 36.0–46.0)
Hemoglobin: 10.2 g/dL — ABNORMAL LOW (ref 12.0–15.0)
MCH: 30.1 pg (ref 26.0–34.0)
MCHC: 33.3 g/dL (ref 30.0–36.0)
MCV: 90.3 fL (ref 78.0–100.0)
Platelets: 116 10*3/uL — ABNORMAL LOW (ref 150–400)
RBC: 3.39 MIL/uL — ABNORMAL LOW (ref 3.87–5.11)
RDW: 18.6 % — ABNORMAL HIGH (ref 11.5–15.5)
WBC: 9.3 10*3/uL (ref 4.0–10.5)

## 2011-06-14 MED ORDER — PARICALCITOL 5 MCG/ML IV SOLN
INTRAVENOUS | Status: AC
  Administered 2011-06-14: 1 ug via INTRAVENOUS
  Filled 2011-06-14: qty 1

## 2011-06-14 NOTE — Progress Notes (Signed)
Discharge for yesterday delayed secondary to inability to obtain hemodialysis in a timely manner. Patient examined earlier this morning, labs and vital signs reviewed. Nephrology notes for the past 48 hours reviewed. Clinical exam stable and unchanged.  Temp 98.7-BP 100/53-pulse 101-respirations 20-room air, 98% saturation  General appearance: alert, cooperative, appears stated age and no distress Resp: clear to auscultation bilaterally Cardio: regular rate and rhythm, S1, S2 normal, no murmur, click, rub or gallop GI: soft, non-tender; bowel sounds normal; no masses,  no organomegaly Extremities: extremities normal, atraumatic, no cyanosis or edema Neurologic: Grossly normal  Deemed appropriate for discharge home today. Please refer to discharge summary dictated on 06/13/2011.  Junious Silk ANP  I have personally examined this patient and reviewed the entire database. I have reviewed the above note, made any necessary editorial changes, and agree with its content.  Lonia Blood, MD [facility]

## 2011-09-19 NOTE — H&P (Signed)
CC: left shoulder pain  HPI: 66 y/o with worsening left shoulder pain secondary to endstage osteoarthritis. Pt has elected for a total vs reverse arthroplasty to reduce pain and increase function PMH: endstage renal disease, anemia, hepatitis B, hypertension, hx of GI bleed, hypothyroid Allergies: NKDA Meds: pantoprazole, levothyroxine, norco, carafate, vitamins, percocet Family: unknown, pt adopted Social: widowed, non smoker, non drinker ROS: pt on hemodialysis, pain with rom and function of left shoulder  PE: alert and appropriate 66 y/o female in no acute distress Cervical spine: full rom cranial nerves 2-12 intact Left shoulder: limited rom due to pain and weakness, nv intact distally Strength 3.5/5 with ER and IR X-ray: left shoulder endstage osteoarthritis Assessment: left shoulder endstage osteroarthritis Plan: total vs reverse total shoulder depending on appearance of rotator cuff at time of surgery

## 2011-09-21 ENCOUNTER — Encounter (HOSPITAL_COMMUNITY)
Admission: RE | Admit: 2011-09-21 | Discharge: 2011-09-21 | Disposition: A | Discharge status: Home / Self Care | Payer: Medicare Other | Source: Ambulatory Visit | Attending: Orthopedic Surgery | Admitting: Orthopedic Surgery

## 2011-09-21 ENCOUNTER — Encounter (HOSPITAL_COMMUNITY): Payer: Self-pay | Admitting: Pharmacy Technician

## 2011-09-21 ENCOUNTER — Encounter (HOSPITAL_COMMUNITY): Payer: Self-pay

## 2011-09-21 ENCOUNTER — Ambulatory Visit (HOSPITAL_COMMUNITY)
Admission: RE | Admit: 2011-09-21 | Discharge: 2011-09-21 | Disposition: A | Discharge status: Home / Self Care | Payer: Medicare Other | Source: Ambulatory Visit | Attending: Anesthesiology | Admitting: Anesthesiology

## 2011-09-21 DIAGNOSIS — Z01818 Encounter for other preprocedural examination: Secondary | ICD-10-CM | POA: Insufficient documentation

## 2011-09-21 DIAGNOSIS — Z01812 Encounter for preprocedural laboratory examination: Secondary | ICD-10-CM | POA: Insufficient documentation

## 2011-09-21 HISTORY — DX: Anemia, unspecified: D64.9

## 2011-09-21 HISTORY — DX: Localized edema: R60.0

## 2011-09-21 HISTORY — DX: Inflammatory liver disease, unspecified: K75.9

## 2011-09-21 HISTORY — DX: Anuria and oliguria: R34

## 2011-09-21 HISTORY — DX: Personal history of other medical treatment: Z92.89

## 2011-09-21 HISTORY — DX: Hypothyroidism, unspecified: E03.9

## 2011-09-21 HISTORY — DX: Pain in unspecified joint: M25.50

## 2011-09-21 HISTORY — DX: Headache: R51

## 2011-09-21 HISTORY — DX: Dizziness and giddiness: R42

## 2011-09-21 HISTORY — DX: Gastric ulcer, unspecified as acute or chronic, without hemorrhage or perforation: K25.9

## 2011-09-21 HISTORY — DX: Personal history of urinary calculi: Z87.442

## 2011-09-21 HISTORY — DX: Effusion, unspecified joint: M25.40

## 2011-09-21 HISTORY — DX: Reserved for concepts with insufficient information to code with codable children: IMO0002

## 2011-09-21 HISTORY — DX: Personal history of other diseases of the digestive system: Z87.19

## 2011-09-21 HISTORY — DX: Xerosis cutis: L85.3

## 2011-09-21 HISTORY — DX: Edema, unspecified: R60.9

## 2011-09-21 HISTORY — DX: Constipation, unspecified: K59.00

## 2011-09-21 HISTORY — DX: Reserved for inherently not codable concepts without codable children: IMO0001

## 2011-09-21 HISTORY — DX: Unspecified hearing loss, unspecified ear: H91.90

## 2011-09-21 HISTORY — DX: Unspecified osteoarthritis, unspecified site: M19.90

## 2011-09-21 LAB — TYPE AND SCREEN: Antibody Screen: NEGATIVE

## 2011-09-21 LAB — COMPREHENSIVE METABOLIC PANEL
ALT: 22 U/L (ref 0–35)
AST: 37 U/L (ref 0–37)
Albumin: 3 g/dL — ABNORMAL LOW (ref 3.5–5.2)
CO2: 30 mEq/L (ref 19–32)
Calcium: 10.5 mg/dL (ref 8.4–10.5)
Chloride: 93 mEq/L — ABNORMAL LOW (ref 96–112)
GFR calc non Af Amer: 7 mL/min — ABNORMAL LOW (ref >90)
Sodium: 137 mEq/L (ref 135–145)
Total Bilirubin: 0.7 mg/dL (ref 0.3–1.2)

## 2011-09-21 LAB — CBC
Hemoglobin: 11.9 g/dL — ABNORMAL LOW (ref 12.0–15.0)
MCH: 31 pg (ref 26.0–34.0)
MCHC: 31.7 g/dL (ref 30.0–36.0)
MCV: 97.7 fL (ref 78.0–100.0)

## 2011-09-21 LAB — SURGICAL PCR SCREEN: MRSA, PCR: NEGATIVE

## 2011-09-21 MED ORDER — CEFAZOLIN SODIUM-DEXTROSE 2-3 GM-% IV SOLR
2.0000 g | INTRAVENOUS | Status: AC
  Administered 2011-09-22: 2 g via INTRAVENOUS
  Filled 2011-09-21: qty 50

## 2011-09-21 MED ORDER — SODIUM CHLORIDE 0.9 % IV SOLN
INTRAVENOUS | Status: DC
  Administered 2011-09-22: 09:00:00 via INTRAVENOUS

## 2011-09-21 MED ORDER — CHLORHEXIDINE GLUCONATE 4 % EX LIQD: 60.0000 mL | Freq: Once | CUTANEOUS | Status: DC

## 2011-09-21 NOTE — Pre-Procedure Instructions (Signed)
20 Delsie SAROYA RICCOBONO  09/21/2011   Your procedure is scheduled on:  Fri, June 7 @ 10:30 AM  Report to [facility] at 8:30 AM.  Call this number if you have problems the morning of surgery: [phone]   Remember:   Do not eat food:After Midnight.  May have clear liquids: up to 4 Hours before arrival.  Clear liquids include soda, tea, black coffee, apple or grape juice, broth.  Take these medicines the morning of surgery with A SIP OF WATER: Pain Pill(if needed),Levothyroxine(Synthroid),and Pantoprazole(Protonix)   Do not wear jewelry, make-up or nail polish.  Do not wear lotions, powders, or perfumes.   Do not shave 48 hours prior to surgery.   Do not bring valuables to the hospital.  Contacts, dentures or bridgework may not be worn into surgery.  Leave suitcase in the car. After surgery it may be brought to your room.  For patients admitted to the hospital, checkout time is 11:00 AM the day of discharge.   Patients discharged the day of surgery will not be allowed to drive home.    Special Instructions: CHG Shower Use Special Wash: 1/2 bottle night before surgery and 1/2 bottle morning of surgery.   Please read over the following fact sheets that you were given: Pain Booklet, Coughing and Deep Breathing, Blood Transfusion Information, MRSA Information and Surgical Site Infection Prevention

## 2011-09-21 NOTE — Progress Notes (Signed)
Pt doesn't have a cardiologist  Denies ever having an echo/stress test/heart cath  Medical MD is [facility] on [address] \ EKG in epic  No recent CXR

## 2011-09-21 NOTE — Progress Notes (Addendum)
Dialysis on M/W/F [facility] dialysis on [address]

## 2011-09-21 NOTE — Consult Note (Signed)
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  She is for a left total shoulder arthroplasty tomorrow, 09/22/11 by Dr. Ranell Patrick.  I was asked to review labs just before 1600 on 09/21/11 which show a PLT count of 83.  Comparison labs reveal recent PLT trends 70-133.  I called these labs to [facility] who reviewed with Dr. Ranell Patrick.  He is comfortable proceeding as long as follow-up labs are stable.  He is ordering a T&S, PT/PTT and repeating a BMET and CBC when patient arrives for surgery.  Shonna Chock, PA-C

## 2011-09-22 ENCOUNTER — Ambulatory Visit (HOSPITAL_COMMUNITY): Payer: Medicare Other | Admitting: Vascular Surgery

## 2011-09-22 ENCOUNTER — Encounter (HOSPITAL_COMMUNITY)
Admission: RE | Disposition: A | Discharge status: Home / Self Care | Payer: Self-pay | Source: Ambulatory Visit | Attending: Orthopedic Surgery

## 2011-09-22 ENCOUNTER — Encounter (HOSPITAL_COMMUNITY): Payer: Self-pay | Admitting: Vascular Surgery

## 2011-09-22 ENCOUNTER — Inpatient Hospital Stay (HOSPITAL_COMMUNITY): Payer: Medicare Other

## 2011-09-22 ENCOUNTER — Encounter (HOSPITAL_COMMUNITY): Payer: Self-pay | Admitting: *Deleted

## 2011-09-22 ENCOUNTER — Inpatient Hospital Stay (HOSPITAL_COMMUNITY)
Admission: RE | Admit: 2011-09-22 | Discharge: 2011-09-25 | DRG: 483 | Disposition: A | Discharge status: Home / Self Care | Payer: Medicare Other | Source: Ambulatory Visit | Attending: Orthopedic Surgery | Admitting: Orthopedic Surgery

## 2011-09-22 DIAGNOSIS — N2581 Secondary hyperparathyroidism of renal origin: Secondary | ICD-10-CM | POA: Diagnosis present

## 2011-09-22 DIAGNOSIS — Z79899 Other long term (current) drug therapy: Secondary | ICD-10-CM

## 2011-09-22 DIAGNOSIS — N186 End stage renal disease: Secondary | ICD-10-CM | POA: Diagnosis present

## 2011-09-22 DIAGNOSIS — R7309 Other abnormal glucose: Secondary | ICD-10-CM | POA: Diagnosis present

## 2011-09-22 DIAGNOSIS — M67919 Unspecified disorder of synovium and tendon, unspecified shoulder: Secondary | ICD-10-CM | POA: Diagnosis present

## 2011-09-22 DIAGNOSIS — K5900 Constipation, unspecified: Secondary | ICD-10-CM | POA: Diagnosis not present

## 2011-09-22 DIAGNOSIS — Z01818 Encounter for other preprocedural examination: Secondary | ICD-10-CM

## 2011-09-22 DIAGNOSIS — M19019 Primary osteoarthritis, unspecified shoulder: Principal | ICD-10-CM | POA: Diagnosis present

## 2011-09-22 DIAGNOSIS — Z992 Dependence on renal dialysis: Secondary | ICD-10-CM

## 2011-09-22 DIAGNOSIS — E039 Hypothyroidism, unspecified: Secondary | ICD-10-CM | POA: Diagnosis present

## 2011-09-22 DIAGNOSIS — D696 Thrombocytopenia, unspecified: Secondary | ICD-10-CM | POA: Diagnosis present

## 2011-09-22 DIAGNOSIS — K7460 Unspecified cirrhosis of liver: Secondary | ICD-10-CM | POA: Diagnosis present

## 2011-09-22 DIAGNOSIS — K219 Gastro-esophageal reflux disease without esophagitis: Secondary | ICD-10-CM | POA: Diagnosis present

## 2011-09-22 DIAGNOSIS — M719 Bursopathy, unspecified: Secondary | ICD-10-CM | POA: Diagnosis present

## 2011-09-22 DIAGNOSIS — D649 Anemia, unspecified: Secondary | ICD-10-CM | POA: Diagnosis present

## 2011-09-22 DIAGNOSIS — I120 Hypertensive chronic kidney disease with stage 5 chronic kidney disease or end stage renal disease: Secondary | ICD-10-CM | POA: Diagnosis present

## 2011-09-22 DIAGNOSIS — Z01812 Encounter for preprocedural laboratory examination: Secondary | ICD-10-CM

## 2011-09-22 DIAGNOSIS — Z96619 Presence of unspecified artificial shoulder joint: Secondary | ICD-10-CM

## 2011-09-22 HISTORY — PX: REVERSE SHOULDER ARTHROPLASTY: SHX5054

## 2011-09-22 LAB — CBC
HCT: 34.2 % — ABNORMAL LOW (ref 36.0–46.0)
MCH: 31.8 pg (ref 26.0–34.0)
MCHC: 32.7 g/dL (ref 30.0–36.0)
RDW: 15.6 % — ABNORMAL HIGH (ref 11.5–15.5)

## 2011-09-22 LAB — BASIC METABOLIC PANEL
BUN: 28 mg/dL — ABNORMAL HIGH (ref 6–23)
Creatinine, Ser: 7.12 mg/dL — ABNORMAL HIGH (ref 0.50–1.10)
GFR calc Af Amer: 6 mL/min — ABNORMAL LOW (ref >90)
GFR calc non Af Amer: 5 mL/min — ABNORMAL LOW (ref >90)
Glucose, Bld: 87 mg/dL (ref 70–99)

## 2011-09-22 LAB — DIFFERENTIAL
Basophils Absolute: 0 10*3/uL (ref 0.0–0.1)
Basophils Relative: 1 % (ref 0–1)
Eosinophils Absolute: 0.2 10*3/uL (ref 0.0–0.7)
Monocytes Absolute: 0.2 10*3/uL (ref 0.1–1.0)
Neutro Abs: 1.4 10*3/uL — ABNORMAL LOW (ref 1.7–7.7)
Neutrophils Relative %: 44 % (ref 43–77)

## 2011-09-22 LAB — PROTIME-INR: Prothrombin Time: 15.1 seconds (ref 11.6–15.2)

## 2011-09-22 LAB — GLUCOSE, CAPILLARY: Glucose-Capillary: 87 mg/dL (ref 70–99)

## 2011-09-22 SURGERY — ARTHROPLASTY, SHOULDER, TOTAL, REVERSE
Anesthesia: General | Site: Shoulder | Laterality: Left | Wound class: Clean

## 2011-09-22 MED ORDER — GLYCOPYRROLATE 0.2 MG/ML IJ SOLN
INTRAMUSCULAR | Status: DC | PRN
  Administered 2011-09-22: .8 mg via INTRAVENOUS

## 2011-09-22 MED ORDER — CALCIUM ACETATE 667 MG PO CAPS
1334.0000 mg | ORAL_CAPSULE | Freq: Three times a day (TID) | ORAL | Status: DC
  Administered 2011-09-23 – 2011-09-25 (×6): 1334 mg via ORAL
  Filled 2011-09-22 (×11): qty 2

## 2011-09-22 MED ORDER — CEFAZOLIN SODIUM-DEXTROSE 2-3 GM-% IV SOLR
2.0000 g | Freq: Four times a day (QID) | INTRAVENOUS | Status: AC
  Administered 2011-09-22 – 2011-09-23 (×2): 2 g via INTRAVENOUS
  Filled 2011-09-22 (×4): qty 50

## 2011-09-22 MED ORDER — SODIUM CHLORIDE 0.9 % IV SOLN
INTRAVENOUS | Status: DC | PRN
  Administered 2011-09-22: 09:00:00 via INTRAVENOUS

## 2011-09-22 MED ORDER — FENTANYL CITRATE 0.05 MG/ML IJ SOLN
INTRAMUSCULAR | Status: AC
  Filled 2011-09-22: qty 2

## 2011-09-22 MED ORDER — HYDROMORPHONE HCL PF 1 MG/ML IJ SOLN: 0.2500 mg | INTRAMUSCULAR | Status: DC | PRN

## 2011-09-22 MED ORDER — NEOSTIGMINE METHYLSULFATE 1 MG/ML IJ SOLN
INTRAMUSCULAR | Status: DC | PRN
  Administered 2011-09-22: 5 mg via INTRAVENOUS

## 2011-09-22 MED ORDER — CALCIUM ACETATE 667 MG PO CAPS: 1334.0000 mg | ORAL_CAPSULE | Freq: Three times a day (TID) | ORAL | Status: DC

## 2011-09-22 MED ORDER — MENTHOL 3 MG MT LOZG: 1.0000 | LOZENGE | OROMUCOSAL | Status: DC | PRN

## 2011-09-22 MED ORDER — ONDANSETRON HCL 4 MG/2ML IJ SOLN: 4.0000 mg | Freq: Four times a day (QID) | INTRAMUSCULAR | Status: DC | PRN

## 2011-09-22 MED ORDER — PHENYLEPHRINE HCL 10 MG/ML IJ SOLN
INTRAMUSCULAR | Status: DC | PRN
  Administered 2011-09-22 (×3): 80 ug via INTRAVENOUS

## 2011-09-22 MED ORDER — SODIUM CHLORIDE 0.9 % IR SOLN
Status: DC | PRN
  Administered 2011-09-22: 1000 mL

## 2011-09-22 MED ORDER — ROPIVACAINE HCL 5 MG/ML IJ SOLN
INTRAMUSCULAR | Status: DC | PRN
  Administered 2011-09-22: 30 mL via EPIDURAL

## 2011-09-22 MED ORDER — ONDANSETRON HCL 4 MG PO TABS: 4.0000 mg | ORAL_TABLET | Freq: Four times a day (QID) | ORAL | Status: DC | PRN

## 2011-09-22 MED ORDER — METHOCARBAMOL 100 MG/ML IJ SOLN
500.0000 mg | Freq: Four times a day (QID) | INTRAVENOUS | Status: DC | PRN
  Filled 2011-09-22: qty 5

## 2011-09-22 MED ORDER — ACETAMINOPHEN 325 MG PO TABS
650.0000 mg | ORAL_TABLET | Freq: Four times a day (QID) | ORAL | Status: DC | PRN
  Administered 2011-09-23 – 2011-09-25 (×3): 650 mg via ORAL
  Filled 2011-09-22 (×3): qty 2

## 2011-09-22 MED ORDER — ONDANSETRON HCL 4 MG/2ML IJ SOLN: 4.0000 mg | Freq: Once | INTRAMUSCULAR | Status: DC | PRN

## 2011-09-22 MED ORDER — PHENOL 1.4 % MT LIQD: 1.0000 | OROMUCOSAL | Status: DC | PRN

## 2011-09-22 MED ORDER — SODIUM CHLORIDE 0.9 % IV SOLN
10.0000 mg | INTRAVENOUS | Status: DC | PRN
  Administered 2011-09-22: 20 ug/min via INTRAVENOUS

## 2011-09-22 MED ORDER — ACETAMINOPHEN 650 MG RE SUPP: 650.0000 mg | Freq: Four times a day (QID) | RECTAL | Status: DC | PRN

## 2011-09-22 MED ORDER — RENA-VITE PO TABS
1.0000 | ORAL_TABLET | Freq: Every day | ORAL | Status: DC
  Administered 2011-09-22 – 2011-09-24 (×3): 1 via ORAL
  Filled 2011-09-22 (×4): qty 1

## 2011-09-22 MED ORDER — HYDROCODONE-ACETAMINOPHEN 5-325 MG PO TABS
1.0000 | ORAL_TABLET | Freq: Four times a day (QID) | ORAL | Status: DC | PRN
  Administered 2011-09-22 – 2011-09-24 (×4): 1 via ORAL
  Filled 2011-09-22 (×4): qty 1

## 2011-09-22 MED ORDER — FENTANYL CITRATE 0.05 MG/ML IJ SOLN
50.0000 ug | INTRAMUSCULAR | Status: DC | PRN
  Administered 2011-09-22: 50 ug via INTRAVENOUS

## 2011-09-22 MED ORDER — POTASSIUM CHLORIDE IN NACL 20-0.9 MEQ/L-% IV SOLN
INTRAVENOUS | Status: DC
  Administered 2011-09-22: 20 mL/h via INTRAVENOUS
  Administered 2011-09-24: 1000 mL via INTRAVENOUS
  Filled 2011-09-22 (×2): qty 1000

## 2011-09-22 MED ORDER — METOCLOPRAMIDE HCL 5 MG PO TABS
5.0000 mg | ORAL_TABLET | Freq: Three times a day (TID) | ORAL | Status: DC | PRN
  Filled 2011-09-22: qty 2

## 2011-09-22 MED ORDER — PHENYLEPHRINE HCL 10 MG/ML IJ SOLN: INTRAMUSCULAR | Status: DC | PRN

## 2011-09-22 MED ORDER — METHOCARBAMOL 500 MG PO TABS
500.0000 mg | ORAL_TABLET | Freq: Four times a day (QID) | ORAL | Status: DC | PRN
  Administered 2011-09-22: 500 mg via ORAL
  Filled 2011-09-22 (×2): qty 1

## 2011-09-22 MED ORDER — MIDAZOLAM HCL 2 MG/2ML IJ SOLN
INTRAMUSCULAR | Status: AC
  Filled 2011-09-22: qty 2

## 2011-09-22 MED ORDER — MIDAZOLAM HCL 2 MG/2ML IJ SOLN
1.0000 mg | INTRAMUSCULAR | Status: DC | PRN
  Administered 2011-09-22 (×2): 1 mg via INTRAVENOUS

## 2011-09-22 MED ORDER — LIDOCAINE HCL (CARDIAC) 20 MG/ML IV SOLN
INTRAVENOUS | Status: DC | PRN
  Administered 2011-09-22: 100 mg via INTRAVENOUS

## 2011-09-22 MED ORDER — LEVOTHYROXINE SODIUM 100 MCG PO TABS
100.0000 ug | ORAL_TABLET | Freq: Every day | ORAL | Status: DC
Start: 2011-09-23 — End: 2011-09-25
  Administered 2011-09-23 – 2011-09-25 (×3): 100 ug via ORAL
  Filled 2011-09-22 (×4): qty 1

## 2011-09-22 MED ORDER — ADULT MULTIVITAMIN W/MINERALS CH: 1.0000 | ORAL_TABLET | Freq: Every day | ORAL | Status: DC

## 2011-09-22 MED ORDER — ONDANSETRON HCL 4 MG/2ML IJ SOLN
INTRAMUSCULAR | Status: DC | PRN
  Administered 2011-09-22: 4 mg via INTRAVENOUS

## 2011-09-22 MED ORDER — BUPIVACAINE-EPINEPHRINE 0.25% -1:200000 IJ SOLN
INTRAMUSCULAR | Status: DC | PRN
  Administered 2011-09-22: 5 mL

## 2011-09-22 MED ORDER — PROPOFOL 10 MG/ML IV EMUL
INTRAVENOUS | Status: DC | PRN
  Administered 2011-09-22: 150 mg via INTRAVENOUS

## 2011-09-22 MED ORDER — METOCLOPRAMIDE HCL 5 MG/ML IJ SOLN
5.0000 mg | Freq: Three times a day (TID) | INTRAMUSCULAR | Status: DC | PRN
  Filled 2011-09-22: qty 2

## 2011-09-22 MED ORDER — MORPHINE SULFATE 2 MG/ML IJ SOLN
1.0000 mg | INTRAMUSCULAR | Status: DC | PRN
  Administered 2011-09-22: 1 mg via INTRAVENOUS
  Administered 2011-09-22: 19:00:00 via INTRAVENOUS
  Filled 2011-09-22 (×2): qty 1

## 2011-09-22 MED ORDER — ROCURONIUM BROMIDE 100 MG/10ML IV SOLN
INTRAVENOUS | Status: DC | PRN
  Administered 2011-09-22: 50 mg via INTRAVENOUS

## 2011-09-22 MED ORDER — FENTANYL CITRATE 0.05 MG/ML IJ SOLN
INTRAMUSCULAR | Status: DC | PRN
  Administered 2011-09-22: 150 ug via INTRAVENOUS
  Administered 2011-09-22: 100 ug via INTRAVENOUS

## 2011-09-22 MED ORDER — PARICALCITOL 5 MCG/ML IV SOLN
8.0000 ug | INTRAVENOUS | Status: DC
  Filled 2011-09-22: qty 1.6

## 2011-09-22 SURGICAL SUPPLY — 72 items
BIT DRILL 170X2.5X (BIT) ×2 IMPLANT
BIT DRL 170X2.5X (BIT) ×2
BLADE SAW SAG 73X25 THK (BLADE) ×1
BLADE SAW SGTL 73X25 THK (BLADE) ×2 IMPLANT
BUR SURG 4X8 MED (BURR) IMPLANT
BURR SURG 4X8 MED (BURR)
CLOTH BEACON ORANGE TIMEOUT ST (SAFETY) ×3 IMPLANT
CLSR STERI-STRIP ANTIMIC 1/2X4 (GAUZE/BANDAGES/DRESSINGS) ×3 IMPLANT
COVER SURGICAL LIGHT HANDLE (MISCELLANEOUS) ×3 IMPLANT
DRAPE INCISE IOBAN 66X45 STRL (DRAPES) ×3 IMPLANT
DRAPE U-SHAPE 47X51 STRL (DRAPES) ×3 IMPLANT
DRAPE X-RAY CASS 24X20 (DRAPES) IMPLANT
DRILL 2.5 (BIT) ×1
DRILL BIT 5/64 (BIT) IMPLANT
DRSG ADAPTIC 3X8 NADH LF (GAUZE/BANDAGES/DRESSINGS) IMPLANT
DRSG PAD ABDOMINAL 8X10 ST (GAUZE/BANDAGES/DRESSINGS) IMPLANT
DURAPREP 26ML APPLICATOR (WOUND CARE) ×3 IMPLANT
ELECT BLADE 4.0 EZ CLEAN MEGAD (MISCELLANEOUS) ×3
ELECT NEEDLE TIP 2.8 STRL (NEEDLE) ×3 IMPLANT
ELECT REM PT RETURN 9FT ADLT (ELECTROSURGICAL) ×3
ELECTRODE BLDE 4.0 EZ CLN MEGD (MISCELLANEOUS) ×2 IMPLANT
ELECTRODE REM PT RTRN 9FT ADLT (ELECTROSURGICAL) ×2 IMPLANT
GLOVE BIOGEL PI ORTHO PRO 7.5 (GLOVE) ×1
GLOVE BIOGEL PI ORTHO PRO SZ8 (GLOVE) ×1
GLOVE ORTHO TXT STRL SZ7.5 (GLOVE) ×3 IMPLANT
GLOVE PI ORTHO PRO STRL 7.5 (GLOVE) ×2 IMPLANT
GLOVE PI ORTHO PRO STRL SZ8 (GLOVE) ×2 IMPLANT
GLOVE SURG ORTHO 8.0 STRL STRW (GLOVE) IMPLANT
GLOVE SURG ORTHO 8.5 STRL (GLOVE) ×6 IMPLANT
GOWN PREVENTION PLUS XXLARGE (GOWN DISPOSABLE) ×3 IMPLANT
GOWN STRL NON-REIN LRG LVL3 (GOWN DISPOSABLE) IMPLANT
GOWN STRL REIN XL XLG (GOWN DISPOSABLE) ×6 IMPLANT
HANDPIECE INTERPULSE COAX TIP (DISPOSABLE)
KIT BASIN OR (CUSTOM PROCEDURE TRAY) ×3 IMPLANT
KIT ROOM TURNOVER OR (KITS) ×3 IMPLANT
MANIFOLD NEPTUNE II (INSTRUMENTS) ×3 IMPLANT
NDL SUT 6 .5 CRC .975X.05 MAYO (NEEDLE) IMPLANT
NEEDLE 1/2 CIR MAYO (NEEDLE) ×3 IMPLANT
NEEDLE HYPO 25GX1X1/2 BEV (NEEDLE) ×3 IMPLANT
NEEDLE MAYO TAPER (NEEDLE)
NS IRRIG 1000ML POUR BTL (IV SOLUTION) ×3 IMPLANT
PACK SHOULDER (CUSTOM PROCEDURE TRAY) ×3 IMPLANT
PAD ARMBOARD 7.5X6 YLW CONV (MISCELLANEOUS) ×6 IMPLANT
PIN GUIDE 1.2 (PIN) IMPLANT
PIN GUIDE GLENOPHERE 1.5MX300M (PIN) IMPLANT
PIN METAGLENE 2.5 (PIN) IMPLANT
SET HNDPC FAN SPRY TIP SCT (DISPOSABLE) IMPLANT
SLING ARM IMMOBILIZER LRG (SOFTGOODS) ×3 IMPLANT
SLING ARM IMMOBILIZER MED (SOFTGOODS) IMPLANT
SMARTMIX MINI TOWER (MISCELLANEOUS) ×3
SPONGE GAUZE 4X4 12PLY (GAUZE/BANDAGES/DRESSINGS) IMPLANT
SPONGE LAP 18X18 X RAY DECT (DISPOSABLE) ×3 IMPLANT
SPONGE LAP 4X18 X RAY DECT (DISPOSABLE) ×6 IMPLANT
SPONGE SURGIFOAM ABS GEL SZ50 (HEMOSTASIS) IMPLANT
STRIP CLOSURE SKIN 1/2X4 (GAUZE/BANDAGES/DRESSINGS) IMPLANT
SUCTION FRAZIER TIP 10 FR DISP (SUCTIONS) ×3 IMPLANT
SUT FIBERWIRE #2 38 T-5 BLUE (SUTURE) ×6
SUT MNCRL AB 4-0 PS2 18 (SUTURE) ×3 IMPLANT
SUT VIC AB 0 CT1 27 (SUTURE) ×1
SUT VIC AB 0 CT1 27XBRD ANBCTR (SUTURE) ×2 IMPLANT
SUT VIC AB 2-0 CT1 27 (SUTURE) ×2
SUT VIC AB 2-0 CT1 TAPERPNT 27 (SUTURE) ×4 IMPLANT
SUT VICRYL AB 2 0 TIES (SUTURE) IMPLANT
SUTURE FIBERWR #2 38 T-5 BLUE (SUTURE) ×4 IMPLANT
SYR CONTROL 10ML LL (SYRINGE) ×3 IMPLANT
TAPE CLOTH SURG 6X10 WHT LF (GAUZE/BANDAGES/DRESSINGS) ×3 IMPLANT
TOWEL OR 17X24 6PK STRL BLUE (TOWEL DISPOSABLE) ×3 IMPLANT
TOWEL OR 17X26 10 PK STRL BLUE (TOWEL DISPOSABLE) ×3 IMPLANT
TOWER SMARTMIX MINI (MISCELLANEOUS) ×2 IMPLANT
TRAY FOLEY CATH 14FR (SET/KITS/TRAYS/PACK) IMPLANT
WATER STERILE IRR 1000ML POUR (IV SOLUTION) ×3 IMPLANT
YANKAUER SUCT BULB TIP NO VENT (SUCTIONS) IMPLANT

## 2011-09-22 NOTE — Transfer of Care (Signed)
Immediate Anesthesia Transfer of Care Note  Patient: Samantha [name]  Procedure(s) Performed: Procedure(s) (LRB): REVERSE SHOULDER ARTHROPLASTY (Left)  Patient Location: PACU  Anesthesia Type: General  Level of Consciousness: awake and patient cooperative  Airway & Oxygen Therapy: Patient Spontanous Breathing and Patient connected to face mask  Post-op Assessment: Report given to PACU RN, Post -op Vital signs reviewed and stable and Patient moving all extremities X 4  Post vital signs: Reviewed and stable + thrill on AV fistula  Complications: No apparent anesthesia complications

## 2011-09-22 NOTE — Interval H&P Note (Signed)
History and Physical Interval Note:  09/22/2011 9:46 AM  Samantha [name]  has presented today for surgery, with the diagnosis of left shoulder OA  The various methods of treatment have been discussed with the patient and family. After consideration of risks, benefits and other options for treatment, the patient has consented to  Procedure(s) (LRB): TOTAL SHOULDER ARTHROPLASTY (Left) as a surgical intervention .  The patients' history has been reviewed, patient examined, no change in status, stable for surgery.  I have reviewed the patients' chart and labs.  Questions were answered to the patient's satisfaction.     Mette Southgate,STEVEN R

## 2011-09-22 NOTE — Progress Notes (Signed)
UR COMPLETED  

## 2011-09-22 NOTE — Brief Op Note (Signed)
09/22/2011  12:58 PM  PATIENT:  Samantha [name]  66 y.o. female  PRE-OPERATIVE DIAGNOSIS:  left shoulder endstage osteoarthritis  POST-OPERATIVE DIAGNOSIS:  left shoulder endstage osteoarthritis  PROCEDURE:  Procedure(s) (LRB): REVERSE SHOULDER ARTHROPLASTY (Left), Zerita Boers Delta Xtend  SURGEON:  Surgeon(s) and Role:    * Verlee Rossetti, MD - Primary  PHYSICIAN ASSISTANT:   ASSISTANTS: Thea Gist, PA-C   ANESTHESIA:   regional and general  EBL:  Total I/O In: 480 [I.V.:250; Blood:230] Out: 200 [Blood:200]  BLOOD ADMINISTERED: Platelets at beginning of surgery  DRAINS: none   LOCAL MEDICATIONS USED:  MARCAINE     SPECIMEN:  No Specimen  DISPOSITION OF SPECIMEN:  N/A  COUNTS:  YES  TOURNIQUET:  * No tourniquets in log *  DICTATION: .Dragon Dictation and Other Dictation: Dictation Number [id number]  PLAN OF CARE: Admit to inpatient   PATIENT DISPOSITION:  PACU - hemodynamically stable.   Delay start of Pharmacological VTE agent (>24hrs) due to surgical blood loss or risk of bleeding: not applicable

## 2011-09-22 NOTE — Anesthesia Preprocedure Evaluation (Addendum)
Anesthesia Evaluation  Patient identified by MRN, date of birth, ID band Patient awake    Reviewed: Allergy & Precautions, H&P , NPO status , Patient's Chart, lab work & pertinent test results  Airway Mallampati: I TM Distance: >3 FB Neck ROM: Full    Dental  (+) Edentulous Upper and Edentulous Lower   Pulmonary          Cardiovascular hypertension, Pt. on medications     Neuro/Psych    GI/Hepatic PUD, GERD-  Medicated and Controlled,(+) Hepatitis -, B  Endo/Other  Diabetes mellitus-, Well Controlled, Type 2, Insulin DependentHypothyroidism   Renal/GU ESRF and DialysisRenal disease     Musculoskeletal   Abdominal   Peds  Hematology   Anesthesia Other Findings   Reproductive/Obstetrics                         Anesthesia Physical Anesthesia Plan  ASA: III  Anesthesia Plan: General   Post-op Pain Management: MAC Combined w/ Regional for Post-op pain   Induction: Intravenous  Airway Management Planned: Oral ETT  Additional Equipment:   Intra-op Plan:   Post-operative Plan: Extubation in OR  Informed Consent: I have reviewed the patients History and Physical, chart, labs and discussed the procedure including the risks, benefits and alternatives for the proposed anesthesia with the patient or authorized representative who has indicated his/her understanding and acceptance.     Plan Discussed with: CRNA and Surgeon  Anesthesia Plan Comments:         Anesthesia Quick Evaluation

## 2011-09-22 NOTE — Discharge Instructions (Signed)
May use the left arm for ADLs.  Limit weight bearing, pushing, and pulling with the arm.  Keep the incision covered for 10 days.  OK to shower and get wet after one week.

## 2011-09-22 NOTE — Op Note (Signed)
Samantha [name], Samantha [name]              ACCOUNT NO.:  [id number]  MEDICAL RECORD NO.:  [id number]  LOCATION:  6733                         FACILITY:  [facility]  PHYSICIAN:  Almedia Balls. Ranell Patrick, M.D. DATE OF BIRTH:  31-Jan-1946  DATE OF PROCEDURE:  09/22/2011 DATE OF DISCHARGE:                              OPERATIVE REPORT   PREOPERATIVE DIAGNOSIS:  Left shoulder end-stage osteoarthritis with rotator cuff dysfunction.  POSTOPERATIVE DIAGNOSIS:  Left shoulder end-stage osteoarthritis with rotator cuff dysfunction.  PROCEDURE PERFORMED:  Left shoulder reverse total shoulder arthroplasty using DePuy Delta Xtrend prosthesis.  ATTENDING SURGEON:  Almedia Balls. Ranell Patrick, MD  ASSISTANT:  Donnie Coffin. Dixon, PA, was scrubbed the entire procedure necessary for satisfactory completion of surgery.  ANESTHESIA:  General anesthesia plus interscalene block anesthesia was used.  ESTIMATED BLOOD LOSS:  Under 100 mL.  FLUID REPLACEMENT:  1000 mL of crystalloid.  COUNTS:  Correct.  COMPLICATIONS:  No complications.  Perioperative antibiotics were given.  INDICATIONS:  The patient is a 66 year old female with end-stage renal disease on dialysis, who has had worsening left shoulder arthritis.  The patient has had a prior right total shoulder done years ago and has done well with that.  Unfortunately, her shoulder has progressive pain wise despite conservative management.  The patient has had injections, modification of activity, topical and oral analgesics.  The patient has persistent debilitating pain.  Due to the patient's poor quality of life related to her left shoulder, we discussed the potential for surgery. We discussed shoulder arthroplasty as the only operation would significantly change her shoulder pain and function.  The patient elected to proceed with surgery.  She was cleared by her primary care physician prior to surgery.  Informed consent was obtained.  DESCRIPTION OF PROCEDURE:  After  adequate level of anesthesia was achieved, the patient was positioned in modified beach-chair position. Time-out called.  Left shoulder correctly identified.  Left shoulder was sterilely prepped and draped in usual manner.  A deltopectoral incision was started at the coracoid process extending down to the anterior humeral shaft.  Dissection down to subcutaneous tissues using Bovie electrocautery.  The cephalic vein was, identified taken laterally with the deltoid.  The pectoralis was taken medially.  The upper centimeter pectoralis was released off the humerus.  Conjoined tendon was identified and mobilized.  Subscapularis released off the lesser tuberosity.  At the biceps screw, placed #2 FiberWire suture in the tendon in a modified W stitch for repair at the end of the case.  We then released the inferior capsule off the humerus as we rotated externally.  Then released the biceps tendon.  We released the supraspinatus tendon, which was thin, but intact.  We then went ahead and performed our neck resection set on 10 degrees of retroversion and we used the intramedullary guide.  We reamed up to a 10 mm diameter for the canal and then used a 10 mm intramedullary reference to resect the head.  Once that was done, we prepared with the reamer for proximal hydroxyapatite coated press-fit stem.  This was an epi 1 left.  Once we did that, we placed the trial component down in its fit securely and matched the proximal  anatomy.  We then retracted the humerus posteriorly, did a 360 degree capsular release and glenoid labral removal.  There was quite a bit of granulation tissue in the shoulder, which we removed.  We placed our retractors and then prepared finding the center point of the glenoid and placing our guide pin and then reamed for the Metaglene and then drilled the central PEG hole out and then we introduced the Metaglene into position.  We gained 3 decent screws one up under the coracoid,  one in the base of the scapular pillar and then one anteriorly all locked screws with good purchase. Posteriorly, there was not enough bone to support a screw.  The Metaglene was stable.  We then placed a 38 standard glenosphere into position.  We then trialed with a +3 trial, we were happy with our soft tissue balancing.  We dislocated the trial humeral prosthesis.  We thoroughly irrigated.  We then placed with impaction bone grafting the real hydroxyapatite coated stem into position which turned out to be between 0 and 10 degrees of retroversion and then placed the real +3 poly 38 + 3 in place and then reduced the shoulder.  We were happy with soft tissue balancing.  No gapping.  Negative sulcus.  Nice tight conjoined tendon.  Axillary nerve checked and not under undue tension. We thoroughly irrigated.  We then repaired the rotator cuff back to greater tuberosity with suture through bone.  We repaired the rotator interval and repaired the subscap anatomically to the lesser tuberosity through 3 good bone tunnels and then also tenodesed the biceps tendon with tension over 90 degrees.  With that soft tissue repair complete, we ranged shoulder, no undue stiffness was noted, no instability.  We then thoroughly irrigated the subdeltoid interval, repaired deltopectoral interval with 0-Vicryl suture followed by 2-0 Vicryl for subcutaneous closure, 4-0 Monocryl for skin.  Steri-Strips were applied followed by Mepilex dressing.  The patient tolerated the procedure well.     Almedia Balls. Ranell Patrick, M.D.     SRN/MEDQ  D:  09/22/2011  T:  09/22/2011  Job:  [id number]

## 2011-09-22 NOTE — Anesthesia Procedure Notes (Addendum)
Anesthesia Regional Block:  Interscalene brachial plexus block  Pre-Anesthetic Checklist: ,, timeout performed, Correct Patient, Correct Site, Correct Laterality, Correct Procedure, Correct Position, site marked, Risks and benefits discussed,  Surgical consent,  Pre-op evaluation,  At surgeon's request and post-op pain management  Laterality: Left  Prep: chloraprep       Needles:  Injection technique: Single-shot  Needle Type: Echogenic Stimulator Needle     Needle Length: 5cm 5 cm     Additional Needles:  Procedures: ultrasound guided and nerve stimulator Interscalene brachial plexus block  Nerve Stimulator or Paresthesia:  Response: 0.4 mA,   Additional Responses:   Narrative:  Start time: 09/22/2011 9:10 AM End time: 09/22/2011 9:20 AM Injection made incrementally with aspirations every 5 mL.  Performed by: Personally  Anesthesiologist: Arta Bruce MD  Additional Notes: Monitors applied. Patient sedated. Sterile prep and drape,hand hygiene and sterile gloves were used. Relevant anatomy identified.Needle position confirmed.Local anesthetic injected incrementally after negative aspiration. Local anesthetic spread visualized around nerve(s). Vascular puncture avoided. No complications. Image printed for medical record.The patient tolerated the procedure well.       Interscalene brachial plexus block Performed by: Cathie Olden B    Procedure Name: Intubation Date/Time: 09/22/2011 11:05 AM Performed by: Sherie Don Pre-anesthesia Checklist: Patient identified, Emergency Drugs available, Suction available, Patient being monitored and Timeout performed Patient Re-evaluated:Patient Re-evaluated prior to inductionOxygen Delivery Method: Circle system utilized Preoxygenation: Pre-oxygenation with 100% oxygen Intubation Type: IV induction Ventilation: Mask ventilation without difficulty Laryngoscope Size: Mac and 3 Grade View: Grade I Tube type: Oral Tube  size: 7.0 mm Number of attempts: 1 Airway Equipment and Method: Stylet Placement Confirmation: ETT inserted through vocal cords under direct vision,  positive ETCO2 and breath sounds checked- equal and bilateral Secured at: 21 cm Tube secured with: Tape Dental Injury: Teeth and Oropharynx as per pre-operative assessment

## 2011-09-22 NOTE — Progress Notes (Signed)
DR.HODIERNE AWARE OF 74 CBG AND DOES NOT WISH TO TREAT AT THIS TIME. PT IS ASYMPTOMATIC.

## 2011-09-22 NOTE — Anesthesia Postprocedure Evaluation (Signed)
  Anesthesia Post-op Note  Patient: Samantha [name]  Procedure(s) Performed: Procedure(s) (LRB): REVERSE SHOULDER ARTHROPLASTY (Left)  Patient Location: PACU  Anesthesia Type: GA combined with regional for post-op pain  Level of Consciousness: awake, alert , oriented and patient cooperative  Airway and Oxygen Therapy: Patient Spontanous Breathing and Patient connected to nasal cannula oxygen  Post-op Pain: none  Post-op Assessment: Post-op Vital signs reviewed, Patient's Cardiovascular Status Stable, Respiratory Function Stable, Patent Airway, No signs of Nausea or vomiting and Pain level controlled  Post-op Vital Signs: stable  Complications: No apparent anesthesia complications

## 2011-09-22 NOTE — Preoperative (Signed)
Beta Blockers   Reason not to administer Beta Blockers:Not Applicable 

## 2011-09-22 NOTE — Progress Notes (Signed)
This note also relates to the following rows which could not be included: CBG Lab Component - View only - Cannot attach notes to extension rows    

## 2011-09-22 NOTE — Consult Note (Signed)
[facility] Renal Consultation Note  Indication for Consultation:  Management of ESRD/hemodialysis; anemia, hypertension/volume and secondary hyperparathyroidism  HPI: Samantha [name] is a 66 y.o. female with ESRD on HD on MWF at [facility] and endstage left shoulder osteoarthritis, who presented today for scheduled reverse left shoulder arthroplasty per Dr. Malon Kindle.  She also has a history of liver cirrhosis and thrombocytopenia and received platelets pre-surgery for a platelet count of 74,000.  She was previously hospitalized 2/19-2/27 for GI bleeding, but her hemoglobin was stable at 11.2 pre-surgery.  She is currently stable after surgery, and pain is controlled.  Past Medical History  Diagnosis Date  . Renal disorder   . Shoulder pain   . Headache     occasionally  . Dizziness   . Occasional numbness/prickling/tingling of fingers and toes   . Arthritis     left shoulder  . Joint pain   . Joint swelling   . Gastric ulcer   . Dry skin   . Peripheral edema   . Constipation   . Oligouria   . H/O: GI bleed   . Hepatitis     Hep B  . History of kidney stones   . History of blood transfusion   . Hypertension   . Anemia   . Hypothyroidism     takes Synthroid daily  . Diabetes mellitus     borderline  . Impaired hearing    Past Surgical History  Procedure Date  . Shoulder surgery 3-58yrs ago    right replacement  . Esophagogastroduodenoscopy 06/09/2011    Procedure: ESOPHAGOGASTRODUODENOSCOPY (EGD);  Surgeon: Theda Belfast, MD;  Location: [facility] ENDOSCOPY;  Service: Endoscopy;  Laterality: N/A;  . Cataract surgery     bilateral   Social History  Both parents died when she was very young, and she was raised by her older sister in [place]. Her husband is deceased, and she lives with the older of her two daughters. She never smoked, drank alcohol, or used illicit drugs, but chews tobacco.  Family History  Unknown  No Known Allergies Prior to Admission  medications   Medication Sig Start Date End Date Taking? Authorizing Provider  calcium acetate (PHOSLO) 667 MG capsule Take 1,334 mg by mouth 3 (three) times daily with meals.   Yes Historical Provider, MD  HYDROcodone-acetaminophen (NORCO) 5-325 MG per tablet Take 1 tablet by mouth every 6 (six) hours as needed. For pain   Yes Historical Provider, MD  levothyroxine (SYNTHROID, LEVOTHROID) 100 MCG tablet Take 100 mcg by mouth daily.   Yes Historical Provider, MD  Multiple Vitamin (MULITIVITAMIN WITH MINERALS) TABS Take 1 tablet by mouth daily.   Yes Historical Provider, MD   Labs:  Results for orders placed during the hospital encounter of 09/22/11 (from the past 48 hour(s))  GLUCOSE, CAPILLARY     Status: Normal   Collection Time   09/22/11  8:11 AM      Component Value Range Comment   Glucose-Capillary 88  70 - 99 (mg/dL)   PROTIME-INR     Status: Normal   Collection Time   09/22/11  8:12 AM      Component Value Range Comment   Prothrombin Time 15.1  11.6 - 15.2 (seconds)    INR 1.17  0.00 - 1.49    BASIC METABOLIC PANEL     Status: Abnormal   Collection Time   09/22/11  8:12 AM      Component Value Range Comment   Sodium 136  135 - 145 (mEq/L)    Potassium 3.8  3.5 - 5.1 (mEq/L)    Chloride 96  96 - 112 (mEq/L)    CO2 28  19 - 32 (mEq/L)    Glucose, Bld 87  70 - 99 (mg/dL)    BUN 28 (*) 6 - 23 (mg/dL)    Creatinine, Ser 9.60 (*) 0.50 - 1.10 (mg/dL)    Calcium 45.4 (*) 8.4 - 10.5 (mg/dL)    GFR calc non Af Amer 5 (*) >90 (mL/min)    GFR calc Af Amer 6 (*) >90 (mL/min)   CBC     Status: Abnormal   Collection Time   09/22/11  8:12 AM      Component Value Range Comment   WBC 3.2 (*) 4.0 - 10.5 (K/uL)    RBC 3.52 (*) 3.87 - 5.11 (MIL/uL)    Hemoglobin 11.2 (*) 12.0 - 15.0 (g/dL)    HCT 09.8 (*) 11.9 - 46.0 (%)    MCV 97.2  78.0 - 100.0 (fL)    MCH 31.8  26.0 - 34.0 (pg)    MCHC 32.7  30.0 - 36.0 (g/dL)    RDW 14.7 (*) 82.9 - 15.5 (%)    Platelets 74 (*) 150 - 400 (K/uL) CONSISTENT  WITH PREVIOUS RESULT  DIFFERENTIAL     Status: Abnormal   Collection Time   09/22/11  8:12 AM      Component Value Range Comment   Neutrophils Relative 44  43 - 77 (%)    Neutro Abs 1.4 (*) 1.7 - 7.7 (K/uL)    Lymphocytes Relative 41  12 - 46 (%)    Lymphs Abs 1.3  0.7 - 4.0 (K/uL)    Monocytes Relative 7  3 - 12 (%)    Monocytes Absolute 0.2  0.1 - 1.0 (K/uL)    Eosinophils Relative 6 (*) 0 - 5 (%)    Eosinophils Absolute 0.2  0.0 - 0.7 (K/uL)    Basophils Relative 1  0 - 1 (%)    Basophils Absolute 0.0  0.0 - 0.1 (K/uL)   APTT     Status: Normal   Collection Time   09/22/11  8:12 AM      Component Value Range Comment   aPTT 36  24 - 37 (seconds)   PREPARE PLATELET PHERESIS     Status: Normal (Preliminary result)   Collection Time   09/22/11 10:03 AM      Component Value Range Comment   Unit Number [id number]      Blood Component Type PLTPHER LR2      Unit division 00      Status of Unit ISSUED      Transfusion Status OK TO TRANSFUSE     GLUCOSE, CAPILLARY     Status: Normal   Collection Time   09/22/11  1:31 PM      Component Value Range Comment   Glucose-Capillary 74  70 - 99 (mg/dL)    Comment 1 Documented in Chart      Comment 2 Notify RN      Review of systems not obtained due to patient factors.  Physical Exam: Filed Vitals:   09/22/11 1330  BP:   Pulse:   Temp: 97 F (36.1 C)  Resp:      General appearance: somnolent s/p surgery, no distress Head: Normocephalic, without obvious abnormality Neck: no adenopathy, no carotid bruit, no JVD and supple, symmetrical, trachea midline Resp: clear to auscultation anteriorly Cardio: regular rate and  rhythm, S1, S2 normal, no murmur, click, rub or gallop GI: soft, non-tender; bowel sounds normal; no masses,  no organomegaly Extremities: mild chronic non-pitting edema Dialysis Access: AVG @ LUA   Dialysis Orders: Center: [facility] on MWF . EDW 69.5 kg   HD Bath 2K/2.25Ca  Time 3 hrs 45 mins  Heparin 2000 U. Access AVG @ LUA   BFR 400 DFR 800    Zemplar 8 mcg IV/HD   Epogen 0 Units IV/HD  Venofer  0   Assessment/Plan: 1. 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 Ranell Patrick today, stable in PACU. 2. ESRD -  HD on MWF at [facility]; K stable at 3.8.  HD today or tomorrow. 3. Hypertension/volume  - BP 104/58 post-surgery (no BP meds); chest x-ray with left basilar opacity consistent with atelectasis and left effusion (not noted in CXR yesterday). 4. Anemia  - Hgb 11.2 pre-surgery, off Epogen and IV Fe.  Recheck pre-HD. 5. Metabolic bone disease -  Ca 10.9, P 3.8 on 6/5, on Phoslo 2 with meals, Zemplar 8 mcg per HD. 6. Nutrition - Last Alb 3.6 on 5/22. 7. History of thrombocytopenia - Plts 73 this AM, received plts pre-surgery. 8. History of GI bleed - EGD on 2/22 with esophageal varices, & gastric ulcers; on PPI.  LYLES,CHARLES 09/22/2011, 2:07 PM   Attending Nephrologist: Delano Metz, MD  I have seen and examined this patient and agree with plan .  Pt awake and alert post op.  Some numbness in Lt hand/arm sec to Scalene block.  Today is her usual day but K ok and volume ok so will plan HD in am and she can resume MWF next week     Caydence Koenig T,MD 09/22/2011 3:29 PM

## 2011-09-23 ENCOUNTER — Inpatient Hospital Stay (HOSPITAL_COMMUNITY): Payer: Medicare Other

## 2011-09-23 LAB — CBC
HCT: 32.9 % — ABNORMAL LOW (ref 36.0–46.0)
Hemoglobin: 10.8 g/dL — ABNORMAL LOW (ref 12.0–15.0)
MCH: 31.2 pg (ref 26.0–34.0)
MCV: 95.1 fL (ref 78.0–100.0)
RBC: 3.46 MIL/uL — ABNORMAL LOW (ref 3.87–5.11)

## 2011-09-23 LAB — GLUCOSE, CAPILLARY
Glucose-Capillary: 91 mg/dL (ref 70–99)
Glucose-Capillary: 100 mg/dL — ABNORMAL HIGH (ref 70–99)
Glucose-Capillary: 103 mg/dL — ABNORMAL HIGH (ref 70–99)

## 2011-09-23 LAB — PREPARE PLATELET PHERESIS

## 2011-09-23 LAB — RENAL FUNCTION PANEL
Albumin: 2.8 g/dL — ABNORMAL LOW (ref 3.5–5.2)
BUN: 42 mg/dL — ABNORMAL HIGH (ref 6–23)
CO2: 24 mEq/L (ref 19–32)
Calcium: 9.2 mg/dL (ref 8.4–10.5)
Chloride: 91 mEq/L — ABNORMAL LOW (ref 96–112)
Creatinine, Ser: 8.81 mg/dL — ABNORMAL HIGH (ref 0.50–1.10)
GFR calc Af Amer: 5 mL/min — ABNORMAL LOW (ref >90)
GFR calc non Af Amer: 4 mL/min — ABNORMAL LOW (ref >90)
Glucose, Bld: 126 mg/dL — ABNORMAL HIGH (ref 70–99)
Phosphorus: 4.6 mg/dL (ref 2.3–4.6)
Potassium: 4.7 mEq/L (ref 3.5–5.1)
Sodium: 132 mEq/L — ABNORMAL LOW (ref 135–145)

## 2011-09-23 MED ORDER — HYDROCODONE-ACETAMINOPHEN 5-325 MG PO TABS
ORAL_TABLET | ORAL | Status: AC
  Filled 2011-09-23: qty 1

## 2011-09-23 MED ORDER — PARICALCITOL 5 MCG/ML IV SOLN
8.0000 ug | INTRAVENOUS | Status: DC
  Filled 2011-09-23 (×2): qty 1.6

## 2011-09-23 MED ORDER — PARICALCITOL 5 MCG/ML IV SOLN
INTRAVENOUS | Status: AC
  Filled 2011-09-23: qty 2

## 2011-09-23 NOTE — Progress Notes (Signed)
Subjective:  Left shoulder pain s/p surgery yesterday; nausea overnight, but better now.  Objective: Vital signs in last 24 hours: Temp:  [97 F (36.1 C)-98.8 F (37.1 C)] 98.8 F (37.1 C) (06/08 0511) Pulse Rate:  [69-93] 89  (06/08 0511) Resp:  [6-34] 17  (06/08 0511) BP: (96-116)/(54-72) 110/69 mmHg (06/08 0511) SpO2:  [93 %-100 %] 100 % (06/08 0511) Weight:  [72.167 kg (159 lb 1.6 oz)] 72.167 kg (159 lb 1.6 oz) (06/07 2154) Weight change:   Intake/Output from previous day: 06/07 0701 - 06/08 0700 In: 1271 [P.O.:240; I.V.:701; Blood:230; IV Piggyback:100] Out: 200 [Blood:200]   EXAM: General appearance:  Alert, slightly confused  Resp:  CTA bilaterally Cardio:  RRR without murmur GI: + BS, soft and nontender Extremities: pitting edema 2+ both LE's Access:  AVG @ LUA with + bruit, but partially covered by sling  Lab Results:  Basename 09/22/11 0812 09/21/11 1359  WBC 3.2* 2.8*  HGB 11.2* 11.9*  HCT 34.2* 37.5  PLT 74* 83*   BMET:  Basename 09/22/11 0812 09/21/11 1359  NA 136 137  K 3.8 3.6  CL 96 93*  CO2 28 30  GLUCOSE 87 143*  BUN 28* 18  CREATININE 7.12* 5.50*  CALCIUM 10.9* 10.5  ALBUMIN -- 3.0*   No results found for this basename: PTH:2 in the last 72 hours Iron Studies: No results found for this basename: IRON,TIBC,TRANSFERRIN,FERRITIN in the last 72 hours  Assessment/Plan:  1. 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 Ranell Patrick yesterday, on PRN pain meds.. 2. ESRD - HD on MWF at [facility]; K stable at 3.8; left arm sling may need to be removed for access to AVG. HD today. 3. Hypertension/volume - BP 110/69 most recently (no BP meds); chest x-ray yesterday with left basilar opacity consistent with atelectasis and left effusion (not noted in CXR on 6/6). Up 3.5-4 kg over EDW with LE edema, pull 4kg with HD today.  4. Anemia - Hgb 11.2 pre-surgery, off Epogen and IV Fe. Recheck pre-HD. 5. Metabolic bone disease - Ca 10.9, P 2.8  on 6/5, on Phoslo 2 with meals, Zemplar 8 mcg per HD. 6. Nutrition - Last Alb 3. 7. History of thrombocytopenia - Plts 73 yesterday, received plts pre-surgery; 74 today. 8. History of GI bleed - EGD on 2/22 with esophageal varices, & gastric ulcers; on PPI.  LOS: 1 day   Samantha [name],Samantha [name] 09/23/2011,7:20 AM  Patient seen and examined and agree with assessment and plan as above with additions as indicated.  Vinson Moselle  MD [employer] [phone] pgr    [phone] cell 09/23/2011, 10:16 AM

## 2011-09-23 NOTE — Progress Notes (Signed)
OT Cancellation Note  Treatment cancelled today due to patient receiving procedure or test at HD. Will re-attempt later.  Kadey Mihalic, OTR/L Pager [phone] 09/23/2011, 10:41 AM

## 2011-09-23 NOTE — Procedures (Signed)
I was present at this dialysis session. I have reviewed the session itself and made appropriate changes.   Vinson Moselle, MD [employer] 09/23/2011, 10:16 AM

## 2011-09-23 NOTE — Progress Notes (Signed)
Patient ID: Samantha [name], female   DOB: [birth year], 66 y.o.   MRN: [id number] Seems alert.  PT has seen her earlier.  Hgb yesterday stable at 11.2.

## 2011-09-23 NOTE — Evaluation (Signed)
Physical Therapy Evaluation Patient Details Name: Samantha [name] MRN: [id number] DOB: 19-Jul-1945 Today's Date: 09/23/2011 Time: 7253-6644 PT Time Calculation (min): 32 min  PT Assessment / Plan / Recommendation Clinical Impression  66 year old female admitted s/p Lt. reverse shoulder total arthroplasty. Pt mobilizing well although is still very painful. Suspect pt will be able to go home safely however need to make sure pt will have level of assist needed at home when ready for D/C. Currently she will need 24 hour supervision however will likely quckly progress to needing  intermittant assist.    PT Assessment  Patient needs continued PT services    Follow Up Recommendations  Home health PT;Supervision/Assistance - 24 hour (supervision level may improve with progress)    Barriers to Discharge Decreased caregiver support      lEquipment Recommendations  3 in 1 bedside comode       Frequency Min 3X/week    Precautions / Restrictions Precautions Precautions: Fall Required Braces or Orthoses: Other Brace/Splint Other Brace/Splint: Lt. Sling applied Restrictions Weight Bearing Restrictions: Yes RUE Weight Bearing: Non weight bearing Other Position/Activity Restrictions:  Shoulder kept in sling and in neutral position during evaluation.         Mobility  Bed Mobility Bed Mobility: Rolling Right Rolling Right: 4: Min assist Details for Bed Mobility Assistance: Pt cued to support Lt. hand with Rt, flex Lt. knee to roll. PT utilized pad to assist with roll, pt able to contribute well. Transfers Transfers: Sit to Stand;Stand to Sit Sit to Stand: 4: Min guard;Without upper extremity assist;From bed Stand to Sit: 4: Min guard;Without upper extremity assist;To chair/3-in-1 Details for Transfer Assistance: Verbal cues for mechanics and for stabilizing Lt. UE to avoid movement. Pt able to stand without UE support. Ambulation/Gait Ambulation/Gait Assistance: 4: Min assist Ambulation  Distance (Feet): 8 Feet Assistive device: None Ambulation/Gait Assistance Details: Light min assist for slight imbalance with gait. Question if pt will benefit from a cane Gait Pattern: Step-to pattern;Shuffle;Wide base of support;Trunk flexed Stairs: No        PT Diagnosis: Difficulty walking;Abnormality of gait;Generalized weakness;Acute pain  PT Problem List: Decreased strength;Decreased activity tolerance;Decreased balance;Decreased mobility;Decreased knowledge of use of DME;Decreased knowledge of precautions;Pain PT Treatment Interventions: DME instruction;Gait training;Stair training;Functional mobility training;Therapeutic activities;Therapeutic exercise;Balance training;Neuromuscular re-education;Patient/family education   PT Goals Acute Rehab PT Goals PT Goal Formulation: With patient Time For Goal Achievement: 10/07/11 Potential to Achieve Goals: Good Pt will go Supine/Side to Sit: with modified independence PT Goal: Supine/Side to Sit - Progress: Goal set today Pt will go Sit to Supine/Side: with modified independence PT Goal: Sit to Supine/Side - Progress: Goal set today Pt will go Sit to Stand: with modified independence PT Goal: Sit to Stand - Progress: Goal set today Pt will go Stand to Sit: with modified independence PT Goal: Stand to Sit - Progress: Goal set today Pt will Transfer Bed to Chair/Chair to Bed: with modified independence PT Transfer Goal: Bed to Chair/Chair to Bed - Progress: Goal set today Pt will Ambulate: 51 - 150 feet;with supervision PT Goal: Ambulate - Progress: Goal set today  Visit Information  Last PT Received On: 09/23/11 Assistance Needed: +1    Subjective Data  Subjective: I'm aching a little   Prior Functioning  Home Living Lives With: Daughter (and grandson) Available Help at Discharge: Family (will not have 24 hour supervision) Type of Home: House Home Access: Stairs to enter [employer] of Steps: 1 Entrance  Stairs-Rails: None Home Layout:  One level Bathroom Shower/Tub: Associate Professor: Yes How Accessible: Accessible via wheelchair (not 100% certain) Home Adaptive Equipment: None Prior Function Level of Independence: Needs assistance Needs Assistance: Gait;Bathing Bath: Minimal (took a bird bath, not in tub) Gait Assistance: one hand hold assist Able to Take Stairs?: Yes Driving: No Vocation: Retired Musician: HOH Dominant Hand: Right    Cognition  Overall Cognitive Status: Impaired Area of Impairment: Problem solving Arousal/Alertness: Awake/alert Orientation Level: Appears intact for tasks assessed Cognition - Other Comments: Very slow to process at times.    Extremity/Trunk Assessment Right Lower Extremity Assessment RLE ROM/Strength/Tone: Deficits RLE ROM/Strength/Tone Deficits: Functionally weak, able to perform requested tasks RLE Sensation: WFL - Light Touch Left Lower Extremity Assessment LLE ROM/Strength/Tone: Deficits LLE ROM/Strength/Tone Deficits: Functionally weak, able to perform requested tasks LLE Sensation: [facility] - Light Touch   Balance Balance Balance Assessed: Yes Static Sitting Balance Static Sitting - Balance Support: No upper extremity supported;Feet supported Static Sitting - Level of Assistance: 5: Stand by assistance Static Standing Balance Static Standing - Balance Support: No upper extremity supported Static Standing - Level of Assistance: 5: Stand by assistance  End of Session PT - End of Session Equipment Utilized During Treatment: Gait belt Activity Tolerance: Patient tolerated treatment well;Patient limited by pain Patient left: in chair;with call bell/phone within reach Nurse Communication: Mobility status;Precautions   Wilhemina Bonito 09/23/2011, 10:53 AM  Sherie Don) Carleene Mains PT, DPT Acute Rehabilitation [phone]

## 2011-09-24 LAB — GLUCOSE, CAPILLARY: Glucose-Capillary: 109 mg/dL — ABNORMAL HIGH (ref 70–99)

## 2011-09-24 MED ORDER — SORBITOL 70 % SOLN
30.0000 mL | Status: DC | PRN
  Filled 2011-09-24: qty 30

## 2011-09-24 MED ORDER — ACETAMINOPHEN 325 MG PO TABS: 650.0000 mg | ORAL_TABLET | Freq: Four times a day (QID) | ORAL | Status: DC | PRN

## 2011-09-24 MED ORDER — HEPARIN SODIUM (PORCINE) 1000 UNIT/ML DIALYSIS: 20.0000 [IU]/kg | INTRAMUSCULAR | Status: DC | PRN

## 2011-09-24 MED ORDER — ZOLPIDEM TARTRATE 5 MG PO TABS: 5.0000 mg | ORAL_TABLET | Freq: Every evening | ORAL | Status: DC | PRN

## 2011-09-24 MED ORDER — CALCIUM CARBONATE 1250 MG/5ML PO SUSP: 500.0000 mg | Freq: Four times a day (QID) | ORAL | Status: DC | PRN

## 2011-09-24 MED ORDER — DOCUSATE SODIUM 283 MG RE ENEM: 1.0000 | ENEMA | RECTAL | Status: DC | PRN

## 2011-09-24 MED ORDER — HYDROXYZINE HCL 25 MG PO TABS: 25.0000 mg | ORAL_TABLET | Freq: Three times a day (TID) | ORAL | Status: DC | PRN

## 2011-09-24 MED ORDER — ONDANSETRON HCL 4 MG/2ML IJ SOLN: 4.0000 mg | Freq: Four times a day (QID) | INTRAMUSCULAR | Status: DC | PRN

## 2011-09-24 MED ORDER — ACETAMINOPHEN 650 MG RE SUPP: 650.0000 mg | Freq: Four times a day (QID) | RECTAL | Status: DC | PRN

## 2011-09-24 MED ORDER — ONDANSETRON HCL 4 MG PO TABS: 4.0000 mg | ORAL_TABLET | Freq: Four times a day (QID) | ORAL | Status: DC | PRN

## 2011-09-24 MED ORDER — NEPRO/CARBSTEADY PO LIQD: 237.0000 mL | Freq: Three times a day (TID) | ORAL | Status: DC | PRN

## 2011-09-24 MED ORDER — CAMPHOR-MENTHOL 0.5-0.5 % EX LOTN
1.0000 "application " | TOPICAL_LOTION | Freq: Three times a day (TID) | CUTANEOUS | Status: DC | PRN
  Filled 2011-09-24: qty 222

## 2011-09-24 NOTE — Progress Notes (Signed)
Occupational Therapy Evaluation Patient Details Name: Samantha [name] MRN: [id number] DOB: [birth year] Today's Date: 09/24/2011 Time: 4098-1191 OT Time Calculation (min): 43 min  OT Assessment / Plan / Recommendation Clinical Impression  66 yo s/p L reverse TSA. PT NWB. LUE A/AAROM within pain tolerance. Began pendulum exercises in standing with mod vc. AAROM in supine. @ 30 degrees ER and ABD and 45 degrees FF achieved. Good participaiton. Pt will benefit from skilled OT services secondary to deficits listed below, to max independence with ADL , functional mobility and teach HEP and protocal for LUE to pt/family.    OT Assessment  Patient needs continued OT Services    Follow Up Recommendations  Home health OT    Barriers to Discharge None    Equipment Recommendations  Cane;3 in 1 bedside comode    Recommendations for Other Services  none  Frequency  Min 2X/week    Precautions / Restrictions Precautions Precautions: Fall Required Braces or Orthoses: Other Brace/Splint Other Brace/Splint: Lt. Sling applied Restrictions Weight Bearing Restrictions: Yes RUE Weight Bearing: Non weight bearing LUE Weight Bearing: Non weight bearing   Pertinent Vitals/Pain 5. nsg aware    ADL  Eating/Feeding: Performed;Set up Where Assessed - Eating/Feeding: Chair Grooming: Simulated;Minimal assistance Where Assessed - Grooming: Supported sitting Upper Body Bathing: Simulated;Moderate assistance Where Assessed - Upper Body Bathing: Supported sitting Lower Body Bathing: Simulated;Moderate assistance Where Assessed - Lower Body Bathing: Unsupported sitting Upper Body Dressing: Simulated;Moderate assistance Where Assessed - Upper Body Dressing: Unsupported sitting Lower Body Dressing: Simulated;Moderate assistance Where Assessed - Lower Body Dressing: Sopported sit to stand Toilet Transfer: Simulated;Minimal assistance Toilet Transfer Method: Sit to stand Tub/Shower Transfer:  (pt only does   sponge bath) Transfers/Ambulation Related to ADLs: Min A for safety ADL Comments: Pt's grandson/caregiver educated in shoulder protocal. Written information given regarding ADL, sling mgnt, exercise, positioning.     OT Diagnosis: Generalized weakness;Acute pain  OT Problem List: Decreased strength;Decreased range of motion;Decreased coordination;Decreased knowledge of precautions;Impaired UE functional use;Pain;Increased edema OT Treatment Interventions: Self-care/ADL training;Therapeutic exercise;Therapeutic activities;Patient/family education   OT Goals Acute Rehab OT Goals OT Goal Formulation: With patient Time For Goal Achievement: 10/01/11 Potential to Achieve Goals: Good ADL Goals Pt Will Perform Upper Body Bathing: with min assist;with caregiver independent in assisting;Supported;Standing at sink;with cueing (comment type and amount) ADL Goal: Upper Body Bathing - Progress: Goal set today Pt Will Perform Upper Body Dressing: with min assist;with caregiver independent in assisting;with cueing (comment type and amount);Supported;Sit to stand from chair ADL Goal: Upper Body Dressing - Progress: Goal set today Pt Will Transfer to Toilet: with supervision;with caregiver independent in assisting;Ambulation;3-in-1 ADL Goal: Toilet Transfer - Progress: Goal set today Pt Will Perform Toileting - Clothing Manipulation: with supervision;with caregiver independent in assisting;Standing;with cueing (comment type and amount) ADL Goal: Toileting - Clothing Manipulation - Progress: Goal set today Pt Will Perform Toileting - Hygiene: with supervision;Sit to stand from 3-in-1/toilet;with cueing (comment type and amount) ADL Goal: Toileting - Hygiene - Progress: Goal set today Arm Goals Pt Will Perform AROM: with minimal assist;Left upper extremity;to maintain range of motion;2 sets;Other (comment) (LUE A/AAROM within pain tolerance) Arm Goal: AROM - Progress: Goal set today Additional Arm Goal #1:  Pt/family will be independent in positioning LUE and sling mgnt. Arm Goal: Additional Goal #1 - Progress: Goal set today  Visit Information  Last OT Received On: 09/24/11 Assistance Needed: +1    Subjective Data  Subjective: My arm is going to hurt   Prior Functioning  Home Living Lives With: Family Available Help at Discharge: Available 24 hours/day;Family Type of Home: House Home Access: Stairs to enter [employer] of Steps: 0 Entrance Stairs-Rails: None Home Layout: One level Bathroom Shower/Tub: Engineer, manufacturing systems: Standard Bathroom Accessibility: Yes How Accessible: Accessible via wheelchair Home Adaptive Equipment: None Prior Function Level of Independence: Independent Comments: grandson states that pt was independent with B/D and ambulation PTA. Pt stated she did not use a cane but thinks she needs one. Communication Communication: HOH Dominant Hand: Right    Cognition  Overall Cognitive Status: Appears within functional limits for tasks assessed/performed Area of Impairment: Problem solving (requires incrased time) Arousal/Alertness: Awake/alert Orientation Level: Appears intact for tasks assessed Behavior During Session: [facility] for tasks performed    Extremity/Trunk Assessment Right Upper Extremity Assessment RUE ROM/Strength/Tone: [facility] for tasks assessed Left Upper Extremity Assessment LUE ROM/Strength/Tone: Deficits;Due to pain;Due to precautions LUE Sensation: WFL - Light Touch;WFL - Proprioception LUE Coordination: Deficits Trunk Assessment Trunk Assessment: Normal   Mobility Bed Mobility Bed Mobility: Rolling Right;Right Sidelying to Sit;Sitting - Scoot to Edge of Bed Rolling Right: 5: Supervision Right Sidelying to Sit: 5: Supervision Sitting - Scoot to Edge of Bed: 6: Modified independent (Device/Increase time) Details for Bed Mobility Assistance: Demonstrated carry over from earlier session with PT Transfers Transfers: Sit to  Stand;Stand to Sit Sit to Stand: With upper extremity assist;5: Supervision Stand to Sit: 5: Supervision;With upper extremity assist   Exercise General Exercises - Upper Extremity Shoulder Flexion: AAROM;Left;5 reps;Supine Shoulder ABduction: AAROM;Left;5 reps Shoulder ADduction: AAROM;Left;5 reps Elbow Extension: AROM;AAROM;Left;5 reps Wrist Flexion: AROM;AAROM;Left;5 reps Wrist Extension: AROM;Left;5 reps Digit Composite Flexion: AROM;Left;5 reps Shoulder Exercises Pendulum Exercise: AAROM;Left;10 reps;Standing Shoulder External Rotation: AAROM;Left;5 reps;Supine Hand Exercises Forearm Supination: AROM;Left;5 reps;Seated Forearm Pronation: AROM;Left;5 reps;Seated  Balance  supervision  End of Session OT - End of Session Equipment Utilized During Treatment: Gait belt Activity Tolerance: Patient tolerated treatment well Patient left: in chair;with call bell/phone within reach;with family/visitor present Nurse Communication: Mobility status   Linden Tagliaferro,HILLARY 09/24/2011, 5:29 PM [facility], OTR/L  [phone] 09/24/2011

## 2011-09-24 NOTE — Progress Notes (Signed)
Physical Therapy Treatment Patient Details Name: Samantha [name] MRN: [id number] DOB: [birth year] Today's Date: 09/24/2011 Time: 0930-1020 PT Time Calculation (min): 50 min  PT Assessment / Plan / Recommendation Comments on Treatment Session  Pt significantly increased ambulation distance today.   She is functioning at a level that would require intermittent supervision with mobility. Pt's son in law states that she has family at home with her around the clock to help her with anything she needs.   Regarding mobility status, pt is fine to d/c home and will likely do so after receiving HD tomorrow morning.   Pt would benefit from using a single point cane for increased stability and independence with transfers and walking.   Pt would benefit from home health PT to address stability concerns while walking and increase overall independent function.    Follow Up Recommendations  Home health PT;Supervision - Intermittent    Barriers to Discharge        Equipment Recommendations  Cane;3 in 1 bedside comode    Recommendations for Other Services    Frequency Min 3X/week   Plan Equipment recommendations need to be updated;Discharge plan remains appropriate    Precautions / Restrictions Precautions Precautions: Fall Required Braces or Orthoses: Other Brace/Splint Other Brace/Splint: Lt. Sling applied Restrictions Weight Bearing Restrictions: Yes RUE Weight Bearing: Non weight bearing LUE Weight Bearing: Non weight bearing Other Position/Activity Restrictions: SHoulder kept in sling and in nuetral position during treatment.    Pertinent Vitals/Pain Pt states her should is achy but does not give it a number. Nurse was notified to administer pain meds.      Mobility  Bed Mobility Bed Mobility: Rolling Right;Right Sidelying to Sit;Sitting - Scoot to [place] of Bed Rolling Right: 5: Supervision Right Sidelying to Sit: 5: Supervision Sitting - Scoot to Edge of Bed: 6: Modified independent  (Device/Increase time) Details for Bed Mobility Assistance: Pt cued to support LUE with R hand and flex knees to help with mobility. After rolling to right side, patient used hand rail to push up to a seated position.  Transfers Transfers: Sit to Stand;Stand to Sit Sit to Stand: With upper extremity assist;5: Supervision Stand to Sit: 5: Supervision;With upper extremity assist Details for Transfer Assistance: Verbal cues to remind pt to get feet underneath her before standing.  Ambulation/Gait Ambulation/Gait Assistance: 4: Min guard Ambulation Distance (Feet): 75 Feet Assistive device: Straight cane Ambulation/Gait Assistance Details: Pt walked about 45 feet without cane but was then given a single point cane to help with balance and stability which was beneficial to pt and iallowed for increased independence as only min guard was needed with use of the cane.  Gait Pattern: Step-through pattern;Wide base of support;Trunk flexed General Gait Details: walks with overall flexed posture in trunk. Stairs: No    Exercises     PT Diagnosis:    PT Problem List:   PT Treatment Interventions:     PT Goals Acute Rehab PT Goals PT Goal Formulation: With patient Time For Goal Achievement: 10/07/11 Potential to Achieve Goals: Good Pt will go Supine/Side to Sit: with modified independence PT Goal: Supine/Side to Sit - Progress: Progressing toward goal Pt will go Sit to Supine/Side: with modified independence PT Goal: Sit to Supine/Side - Progress: Progressing toward goal Pt will go Sit to Stand: with modified independence PT Goal: Sit to Stand - Progress: Progressing toward goal Pt will go Stand to Sit: with modified independence PT Goal: Stand to Sit - Progress: Progressing toward goal Pt  will Transfer Bed to Chair/Chair to Bed: with modified independence PT Transfer Goal: Bed to Chair/Chair to Bed - Progress: Progressing toward goal Pt will Ambulate: 51 - 150 feet;with supervision PT Goal:  Ambulate - Progress: Progressing toward goal  Visit Information  Last PT Received On: 09/24/11 Assistance Needed: +1    Subjective Data  Subjective: Pt reports she is having some pain in shoulder but does not rate it with a number. States it is achy.  Patient Stated Goal: To go home   Cognition  Overall Cognitive Status: Appears within functional limits for tasks assessed/performed Arousal/Alertness: Awake/alert Orientation Level: Appears intact for tasks assessed Behavior During Session: [facility] for tasks performed    Balance  Balance Balance Assessed: Yes Static Sitting Balance Static Sitting - Balance Support: No upper extremity supported;Feet supported Static Sitting - Level of Assistance: 7: Independent Static Standing Balance Static Standing - Balance Support: No upper extremity supported Static Standing - Level of Assistance: 5: Stand by assistance  End of Session PT - End of Session Equipment Utilized During Treatment: Gait belt Activity Tolerance: Patient tolerated treatment well;Patient limited by fatigue Patient left: in chair;with call bell/phone within reach;with family/visitor present Nurse Communication: Mobility status;Patient requests pain meds    Van Clines Dr[facility] [place], [place] [phone] 09/24/2011, 11:58 AM

## 2011-09-24 NOTE — Progress Notes (Signed)
Subjective:  Left shoulder pain, feels constipated, no recent BM.  Objective: Vital signs in last 24 hours: Temp:  [97.6 F (36.4 C)-98.8 F (37.1 C)] 98.1 F (36.7 C) (06/09 0453) Pulse Rate:  [77-123] 94  (06/09 0453) Resp:  [12-19] 18  (06/09 0453) BP: (71-132)/(41-78) 105/72 mmHg (06/09 0453) SpO2:  [96 %-100 %] 97 % (06/09 0453) Weight:  [70 kg (154 lb 5.2 oz)-73.1 kg (161 lb 2.5 oz)] 70.8 kg (156 lb 1.4 oz) (06/08 2112) Weight change: 0.933 kg (2 lb 0.9 oz)  Intake/Output from previous day: 06/08 0701 - 06/09 0700 In: 445 [P.O.:285; I.V.:160] Out: 3247    EXAM: General appearance:  Alert, in no apparent distress Resp:  CTA bilaterally Cardio: RRR without murmur GI: + BS, soft and nontender Extremities:  1+ edema bilaterally, chronic Access:  AVG @ LUA with + bruit  Lab Results:  Basename 09/23/11 0945 09/22/11 0812  WBC 4.6 3.2*  HGB 10.8* 11.2*  HCT 32.9* 34.2*  PLT 93* 74*   BMET:  Basename 09/23/11 0945 09/22/11 0812 09/21/11 1359  NA 132* 136 --  K 4.7 3.8 --  CL 91* 96 --  CO2 24 28 --  GLUCOSE 126* 87 --  BUN 42* 28* --  CREATININE 8.81* 7.12* --  CALCIUM 9.2 10.9* --  ALBUMIN 2.8* -- 3.0*   No results found for this basename: PTH:2 in the last 72 hours Iron Studies: No results found for this basename: IRON,TIBC,TRANSFERRIN,FERRITIN in the last 72 hours  Assessment/Plan: 1. 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 Ranell Patrick 6/7, on PRN pain meds. 2. ESRD - HD on MWF at [facility]; K pre-HD 4.7 yesterday.  Next HD on 6/10 3. Hypertension/volume - BP 105/72 most recently (no BP meds); chest x-ray 6/7 with left basilar opacity consistent with atelectasis and left effusion (not noted in CXR on 6/6); post-HD wt 70.8 with net UF of 3247 ml (SBP fell into 80s during HD), EDW 69.5 kg. 4. Anemia - Hgb 10.8 post-surgery, off Epogen and IV Fe. 5. Metabolic bone disease - Ca 9.2, P 2.8 on 6/5, on Phoslo 2 with meals, Zemplar 8 mcg per  HD. 6. Nutrition - Last Alb 2.8. 7. History of thrombocytopenia - Plts 93 yesterday, received plts pre-surgery. 8. History of GI bleed - EGD on 2/22 with esophageal varices, & gastric ulcers; on PPI.    LOS: 2 days   LYLES,CHARLES 09/24/2011,7:15 AM  Patient seen and examined and agree with assessment and plan as above.  Vinson Moselle  MD [employer] [phone] pgr    [phone] cell 09/24/2011, 2:31 PM

## 2011-09-24 NOTE — Progress Notes (Signed)
Subjective: 2 Days Post-Op Procedure(s) (LRB): REVERSE SHOULDER ARTHROPLASTY (Left) Patient reports pain as mild.   Patient seen in rounds with Dr. Darrelyn Hillock. Patient is well, but has had some minor complaints of pain in the left shoulder, requiring pain medications. She reports that she has dialysis tomorrow. No chest pain or shortness of breath. Plan is to go Home after hospital stay. Working with OT and PT today.  Objective: Vital signs in last 24 hours: Temp:  [97.6 F (36.4 C)-98.8 F (37.1 C)] 97.6 F (36.4 C) (06/09 0915) Pulse Rate:  [78-123] 88  (06/09 0915) Resp:  [16-22] 22  (06/09 0915) BP: (71-132)/(41-78) 112/78 mmHg (06/09 0915) SpO2:  [96 %-100 %] 98 % (06/09 0915) Weight:  [70 kg (154 lb 5.2 oz)-70.8 kg (156 lb 1.4 oz)] 70.8 kg (156 lb 1.4 oz) (06/08 2112)  Intake/Output from previous day:  Intake/Output Summary (Last 24 hours) at 09/24/11 1003 Last data filed at 09/24/11 0900  Gross per 24 hour  Intake    535 ml  Output   3248 ml  Net  -2713 ml    Intake/Output this shift: Total I/O In: 120 [P.O.:120] Out: 1 [Stool:1]  Labs:  [facility] 09/23/11 0945 09/22/11 0812 09/21/11 1359  HGB 10.8* 11.2* 11.9*    Basename 09/23/11 0945 09/22/11 0812  WBC 4.6 3.2*  RBC 3.46* 3.52*  HCT 32.9* 34.2*  PLT 93* 74*    Basename 09/23/11 0945 09/22/11 0812  NA 132* 136  K 4.7 3.8  CL 91* 96  CO2 24 28  BUN 42* 28*  CREATININE 8.81* 7.12*  GLUCOSE 126* 87  CALCIUM 9.2 10.9*    Basename 09/22/11 0812  LABPT --  INR 1.17    EXAM General - Patient is Alert and Oriented Extremity - Neurologically intact Neurovascular intact Dressing/Incision - clean, dry, no drainage. Dressing changed Motor Function - intact, moving hand and fingers well on exam.   Past Medical History  Diagnosis Date  . Renal disorder   . Shoulder pain   . Headache     occasionally  . Dizziness   . Occasional numbness/prickling/tingling of fingers and toes   . Arthritis     left  shoulder  . Joint pain   . Joint swelling   . Gastric ulcer   . Dry skin   . Peripheral edema   . Constipation   . Oligouria   . H/O: GI bleed   . Hepatitis     Hep B  . History of kidney stones   . History of blood transfusion   . Hypertension   . Anemia   . Hypothyroidism     takes Synthroid daily  . Diabetes mellitus     borderline  . Impaired hearing     Assessment/Plan: 2 Days Post-Op Procedure(s) (LRB): REVERSE SHOULDER ARTHROPLASTY (Left) Active Problems:  * No active hospital problems. *    Advance diet as tolerated Continue to wear sling on left arm Possible discharge home tomorrow after dialysis  Rogerio Boutelle LAUREN 09/24/2011, 10:03 AM

## 2011-09-25 ENCOUNTER — Encounter (HOSPITAL_COMMUNITY): Payer: Self-pay | Admitting: Orthopedic Surgery

## 2011-09-25 ENCOUNTER — Inpatient Hospital Stay (HOSPITAL_COMMUNITY): Payer: Medicare Other

## 2011-09-25 LAB — CBC
HCT: 29.2 % — ABNORMAL LOW (ref 36.0–46.0)
Hemoglobin: 9.7 g/dL — ABNORMAL LOW (ref 12.0–15.0)
MCH: 31.4 pg (ref 26.0–34.0)
RBC: 3.09 MIL/uL — ABNORMAL LOW (ref 3.87–5.11)

## 2011-09-25 LAB — RENAL FUNCTION PANEL
CO2: 22 mEq/L (ref 19–32)
Calcium: 9.7 mg/dL (ref 8.4–10.5)
Chloride: 88 mEq/L — ABNORMAL LOW (ref 96–112)
Creatinine, Ser: 7.88 mg/dL — ABNORMAL HIGH (ref 0.50–1.10)
GFR calc Af Amer: 5 mL/min — ABNORMAL LOW (ref >90)
GFR calc non Af Amer: 5 mL/min — ABNORMAL LOW (ref >90)
Glucose, Bld: 114 mg/dL — ABNORMAL HIGH (ref 70–99)

## 2011-09-25 MED ORDER — BISACODYL 10 MG RE SUPP: 10.0000 mg | Freq: Every day | RECTAL | Status: DC | PRN

## 2011-09-25 MED ORDER — NEPRO/CARBSTEADY PO LIQD
237.0000 mL | ORAL | Status: DC
  Administered 2011-09-25: 237 mL via ORAL

## 2011-09-25 MED ORDER — DARBEPOETIN ALFA-POLYSORBATE 25 MCG/0.42ML IJ SOLN
12.5000 ug | INTRAMUSCULAR | Status: DC
  Administered 2011-09-25: 12.5 ug via INTRAVENOUS
  Filled 2011-09-25: qty 0.42

## 2011-09-25 MED ORDER — DARBEPOETIN ALFA-POLYSORBATE 100 MCG/0.5ML IJ SOLN
6.2500 ug/kg | INTRAMUSCULAR | Status: DC
  Filled 2011-09-25: qty 2.5

## 2011-09-25 MED ORDER — DARBEPOETIN ALFA-POLYSORBATE 25 MCG/0.42ML IJ SOLN
INTRAMUSCULAR | Status: AC
  Administered 2011-09-25: 12.5 ug via INTRAVENOUS
  Filled 2011-09-25: qty 0.42

## 2011-09-25 MED ORDER — PARICALCITOL 5 MCG/ML IV SOLN
INTRAVENOUS | Status: AC
  Administered 2011-09-25: 6 ug via INTRAVENOUS
  Filled 2011-09-25: qty 2

## 2011-09-25 MED ORDER — PARICALCITOL 5 MCG/ML IV SOLN
6.0000 ug | INTRAVENOUS | Status: DC
  Administered 2011-09-25: 6 ug via INTRAVENOUS
  Filled 2011-09-25: qty 1.2

## 2011-09-25 MED ORDER — METHOCARBAMOL 500 MG PO TABS: 500.0000 mg | ORAL_TABLET | Freq: Three times a day (TID) | ORAL | Status: AC | PRN

## 2011-09-25 NOTE — Discharge Summary (Signed)
Physician Discharge Summary  Patient ID: Samantha [name] MRN: [id number] DOB/AGE: 1945-05-26 66 y.o.  Admit date: 09/22/2011 Discharge date: 09/25/2011  Admission Diagnoses:  Shoulder arthritis  Discharge Diagnoses:  Same   Surgeries: Procedure(s): REVERSE SHOULDER ARTHROPLASTY on 09/22/2011   Consultants: Nephrology  Discharged Condition: Stable  Hospital Course: Samantha [name] is an 66 y.o. female who was admitted 09/22/2011 with a chief complaint of Shoulder pain, and found to have a diagnosis of shoulder arthritis.  They were brought to the operating room on 09/22/2011 and underwent the above named procedures.    The patient had an uncomplicated hospital course and was stable for discharge.  Recent vital signs:  Filed Vitals:   09/25/11 1003  BP: 109/59  Pulse: 88  Temp:   Resp: 18    Recent laboratory studies:  Results for orders placed during the hospital encounter of 09/22/11  PROTIME-INR      Component Value Range   Prothrombin Time 15.1  11.6 - 15.2 (seconds)   INR 1.17  0.00 - 1.49   BASIC METABOLIC PANEL      Component Value Range   Sodium 136  135 - 145 (mEq/L)   Potassium 3.8  3.5 - 5.1 (mEq/L)   Chloride 96  96 - 112 (mEq/L)   CO2 28  19 - 32 (mEq/L)   Glucose, Bld 87  70 - 99 (mg/dL)   BUN 28 (*) 6 - 23 (mg/dL)   Creatinine, Ser 4.09 (*) 0.50 - 1.10 (mg/dL)   Calcium 81.1 (*) 8.4 - 10.5 (mg/dL)   GFR calc non Af Amer 5 (*) >90 (mL/min)   GFR calc Af Amer 6 (*) >90 (mL/min)  CBC      Component Value Range   WBC 3.2 (*) 4.0 - 10.5 (K/uL)   RBC 3.52 (*) 3.87 - 5.11 (MIL/uL)   Hemoglobin 11.2 (*) 12.0 - 15.0 (g/dL)   HCT 91.4 (*) 78.2 - 46.0 (%)   MCV 97.2  78.0 - 100.0 (fL)   MCH 31.8  26.0 - 34.0 (pg)   MCHC 32.7  30.0 - 36.0 (g/dL)   RDW 95.6 (*) 21.3 - 15.5 (%)   Platelets 74 (*) 150 - 400 (K/uL)  DIFFERENTIAL      Component Value Range   Neutrophils Relative 44  43 - 77 (%)   Neutro Abs 1.4 (*) 1.7 - 7.7 (K/uL)   Lymphocytes Relative 41   12 - 46 (%)   Lymphs Abs 1.3  0.7 - 4.0 (K/uL)   Monocytes Relative 7  3 - 12 (%)   Monocytes Absolute 0.2  0.1 - 1.0 (K/uL)   Eosinophils Relative 6 (*) 0 - 5 (%)   Eosinophils Absolute 0.2  0.0 - 0.7 (K/uL)   Basophils Relative 1  0 - 1 (%)   Basophils Absolute 0.0  0.0 - 0.1 (K/uL)  APTT      Component Value Range   aPTT 36  24 - 37 (seconds)  GLUCOSE, CAPILLARY      Component Value Range   Glucose-Capillary 88  70 - 99 (mg/dL)  PREPARE PLATELET PHERESIS      Component Value Range   Unit Number [id number]     Blood Component Type PLTPHER LR2     Unit division 00     Status of Unit ISSUED,FINAL     Transfusion Status OK TO TRANSFUSE    GLUCOSE, CAPILLARY      Component Value Range   Glucose-Capillary 74  70 - 99 (  mg/dL)   Comment 1 Documented in Chart     Comment 2 Notify RN    RENAL FUNCTION PANEL      Component Value Range   Sodium 132 (*) 135 - 145 (mEq/L)   Potassium 4.7  3.5 - 5.1 (mEq/L)   Chloride 91 (*) 96 - 112 (mEq/L)   CO2 24  19 - 32 (mEq/L)   Glucose, Bld 126 (*) 70 - 99 (mg/dL)   BUN 42 (*) 6 - 23 (mg/dL)   Creatinine, Ser 6.57 (*) 0.50 - 1.10 (mg/dL)   Calcium 9.2  8.4 - 84.6 (mg/dL)   Phosphorus 4.6  2.3 - 4.6 (mg/dL)   Albumin 2.8 (*) 3.5 - 5.2 (g/dL)   GFR calc non Af Amer 4 (*) >90 (mL/min)   GFR calc Af Amer 5 (*) >90 (mL/min)  GLUCOSE, CAPILLARY      Component Value Range   Glucose-Capillary 79  70 - 99 (mg/dL)  GLUCOSE, CAPILLARY      Component Value Range   Glucose-Capillary 87  70 - 99 (mg/dL)  GLUCOSE, CAPILLARY      Component Value Range   Glucose-Capillary 104 (*) 70 - 99 (mg/dL)  CBC      Component Value Range   WBC 4.6  4.0 - 10.5 (K/uL)   RBC 3.46 (*) 3.87 - 5.11 (MIL/uL)   Hemoglobin 10.8 (*) 12.0 - 15.0 (g/dL)   HCT 96.2 (*) 95.2 - 46.0 (%)   MCV 95.1  78.0 - 100.0 (fL)   MCH 31.2  26.0 - 34.0 (pg)   MCHC 32.8  30.0 - 36.0 (g/dL)   RDW 84.1  32.4 - 40.1 (%)   Platelets 93 (*) 150 - 400 (K/uL)  GLUCOSE, CAPILLARY       Component Value Range   Glucose-Capillary 91  70 - 99 (mg/dL)   Comment 1 Notify RN    GLUCOSE, CAPILLARY      Component Value Range   Glucose-Capillary 100 (*) 70 - 99 (mg/dL)   Comment 1 Documented in Chart     Comment 2 Notify RN    GLUCOSE, CAPILLARY      Component Value Range   Glucose-Capillary 103 (*) 70 - 99 (mg/dL)  GLUCOSE, CAPILLARY      Component Value Range   Glucose-Capillary 109 (*) 70 - 99 (mg/dL)   Comment 1 Notify RN    GLUCOSE, CAPILLARY      Component Value Range   Glucose-Capillary 131 (*) 70 - 99 (mg/dL)   Comment 1 Notify RN    RENAL FUNCTION PANEL      Component Value Range   Sodium 123 (*) 135 - 145 (mEq/L)   Potassium 4.4  3.5 - 5.1 (mEq/L)   Chloride 88 (*) 96 - 112 (mEq/L)   CO2 22  19 - 32 (mEq/L)   Glucose, Bld 114 (*) 70 - 99 (mg/dL)   BUN 46 (*) 6 - 23 (mg/dL)   Creatinine, Ser 0.27 (*) 0.50 - 1.10 (mg/dL)   Calcium 9.7  8.4 - 25.3 (mg/dL)   Phosphorus 3.3  2.3 - 4.6 (mg/dL)   Albumin 2.5 (*) 3.5 - 5.2 (g/dL)   GFR calc non Af Amer 5 (*) >90 (mL/min)   GFR calc Af Amer 5 (*) >90 (mL/min)  CBC      Component Value Range   WBC 4.7  4.0 - 10.5 (K/uL)   RBC 3.09 (*) 3.87 - 5.11 (MIL/uL)   Hemoglobin 9.7 (*) 12.0 - 15.0 (g/dL)  HCT 29.2 (*) 36.0 - 46.0 (%)   MCV 94.5  78.0 - 100.0 (fL)   MCH 31.4  26.0 - 34.0 (pg)   MCHC 33.2  30.0 - 36.0 (g/dL)   RDW 96.0  45.4 - 09.8 (%)   Platelets 74 (*) 150 - 400 (K/uL)    Discharge Medications:   Medication List  As of 09/25/2011 10:23 AM   ASK your doctor about these medications         calcium acetate 667 MG capsule   Commonly known as: PHOSLO   Take 1,334 mg by mouth 3 (three) times daily with meals.      HYDROcodone-acetaminophen 5-325 MG per tablet   Commonly known as: NORCO   Take 1 tablet by mouth every 6 (six) hours as needed. For pain      levothyroxine 100 MCG tablet   Commonly known as: SYNTHROID, LEVOTHROID   Take 100 mcg by mouth daily.      multivitamin with minerals Tabs    Take 1 tablet by mouth daily.            Diagnostic Studies: Dg Chest 2 View  09/21/2011  *RADIOLOGY REPORT*  Clinical Data: Preoperative evaluation for left shoulder replacement.  Diabetes.  CHEST - 2 VIEW  Comparison: 05/09/2011  Findings: Heart size is upper limits of normal to mildly enlarged and stable.  A stable mediastinal contour is seen. The lung fields demonstrate stable prominence of the interstitial markings compatible with underlying bronchitic change. No focal infiltrates or signs of congestive failure are seen. No pleural fluid or increase in peribronchial cuffing is seen.  Bony structures are notable for right shoulder hemiarthroplasty. Degenerative change is seen around the left shoulder girdle.  IMPRESSION: Stable cardiopulmonary appearance with no new or acute cardiopulmonary abnormalities seen.  Original Report Authenticated By: Bertha Stakes, M.D.   Dg Chest Port 1 View  09/22/2011  *RADIOLOGY REPORT*  Clinical Data: Postop, central line placed  PORTABLE CHEST - 1 VIEW  Comparison: Chest x-ray of 09/21/2011  Findings: The lungs are not well aerated and there is opacity at the left lung base most consistent with left pleural effusion and left basilar atelectasis.  The right lung is clear.  Cardiomegaly is stable.  A right IJ central venous line is present with the tip near the expected right atrial - SVC junction.  Bilateral shoulder replacements are noted.  IMPRESSION:  1.  Poor aeration with left basilar opacity consistent with atelectasis and left effusion. 2.  Right central venous line tip near expected SVC - RA junction.  Original Report Authenticated By: Juline Patch, M.D.   Dg Shoulder Left  09/22/2011  *RADIOLOGY REPORT*  Clinical Data: Shoulder arthroplasty  LEFT SHOULDER - 2+ VIEW  Comparison: CT of the left shoulder of 05/09/2011  Findings: A total left shoulder replacement has been performed.  No complicating features are seen.  IMPRESSION: Left total shoulder  replacement.  Original Report Authenticated By: Juline Patch, M.D.    Disposition: 01-Home or Self Care    Follow-up Information    Follow up with Pasquale Matters,STEVEN R, MD. Call in 2 weeks. [phone])    Contact information:   [facility] [address], [address] [place] [address] [phone]           Signed: Verlee Rossetti 09/25/2011, 10:23 AM

## 2011-09-25 NOTE — Procedures (Signed)
I have seen and examined this patient and agree with the plan of care , seen on dialysis  [facility] W 09/25/2011, 8:47 AM

## 2011-09-25 NOTE — Progress Notes (Signed)
Occupational Therapy Treatment Patient Details Name: Samantha [name] MRN: [id number] DOB: [birth year] Today's Date: 09/25/2011 Time: 1340-1405 OT Time Calculation (min): 25 min  OT Assessment / Plan / Recommendation Comments on Treatment Session Performed LUE exercises for the shoulder and elbow.  Pt able to tolerate approximately 0-70 degrees AAROM for shoulder flexion and abduction in supine position.  Approximately 30 degrees for esternal rotation with elbow flexed at 90 degrees.  Performed 2 sets of 10 repetitions for each exercise including elbow flexion, which she needed min facilitation to complete.  Positioned ice on shoulder at end of session and provided handout to grandson for exercising at home.    Follow Up Recommendations  Home health OT    Barriers to Discharge       Equipment Recommendations       Recommendations for Other Services    Frequency     Plan Discharge plan remains appropriate    Precautions / Restrictions Precautions Precautions: Fall Precaution Comments: AAROM to within tolerable limits Required Braces or Orthoses: Other Brace/Splint (Sling) Restrictions Weight Bearing Restrictions: Yes LUE Weight Bearing: Non weight bearing   Pertinent Vitals/Pain Pain 3-4 with AAROM exercises.  1/10 when UE repositioned after movement.         OT Goals Arm Goals Arm Goal: AROM - Progress: Progressing toward goal Arm Goal: Additional Goal #1 - Progress: Progressing toward goals  Visit Information  Last OT Received On: 09/25/11 Assistance Needed: +1    Subjective Data  Subjective: Now your going to hurt me Patient Stated Goal: Pt did not state but eager to go home once her IV is taken out.            Exercises Shoulder Exercises Shoulder Flexion: AAROM;Left;20 reps;Supine Shoulder ABduction: AAROM;Left;20 reps Shoulder External Rotation: AAROM;20 reps;Supine Elbow Flexion: AAROM;20 reps;Supine     End of Session OT - End of Session Activity  Tolerance: Patient limited by pain Patient left: in bed;with family/visitor present   Spyridon Hornstein 09/25/2011, 2:20 PM

## 2011-09-25 NOTE — Progress Notes (Signed)
Orthopedics Progress Note  Subjective: *I feel better Objective:  Filed Vitals:   09/25/11 1003  BP: 109/59  Pulse: 88  Temp:   Resp: 18    General: Awake and alert  Musculoskeletal: L shoulder wound CDI Neurovascularly intact  Lab Results  Component Value Date   WBC 4.7 09/25/2011   HGB 9.7* 09/25/2011   HCT 29.2* 09/25/2011   MCV 94.5 09/25/2011   PLT 74* 09/25/2011       Component Value Date/Time   NA 123* 09/25/2011 0738   K 4.4 09/25/2011 0738   CL 88* 09/25/2011 0738   CO2 22 09/25/2011 0738   GLUCOSE 114* 09/25/2011 0738   BUN 46* 09/25/2011 0738   CREATININE 7.88* 09/25/2011 0738   CALCIUM 9.7 09/25/2011 0738   GFRNONAA 5* 09/25/2011 0738   GFRAA 5* 09/25/2011 0738    Lab Results  Component Value Date   INR 1.17 09/22/2011   INR 1.40 06/06/2011    Assessment/Plan: POD #3 s/p Procedure(s): REVERSE SHOULDER ARTHROPLASTY Stable for D/C home.  Will need home health PT and OT.  Follow up with me in two weeks. Appreciate renal help.  Almedia Balls. Ranell Patrick, MD 09/25/2011 10:27 AM

## 2011-09-25 NOTE — Progress Notes (Signed)
  [facility] Progress Note    Subjective:   I took tylenol for pain.   Objective:    Filed Vitals:   09/24/11 1346 09/24/11 1709 09/24/11 2215 09/25/11 0500  BP: 116/78 101/55 99/64 92/57   Pulse: 76 95 81 78  Temp: 98.5 F (36.9 C) 97.6 F (36.4 C) 98.5 F (36.9 C) 98.8 F (37.1 C)  TempSrc: Oral Axillary Oral Oral  Resp: 20 19 16 16   Weight:   73.1 kg (161 lb 2.5 oz)   SpO2: 99% 98% 100% 97%    Intake/Output Summary (Last 24 hours) at 09/25/11 0716 Last data filed at 09/25/11 0535  Gross per 24 hour  Intake  823.3 ml  Output      1 ml  Net  822.3 ml   Physical Exam General: on HD NAD Heart: RRR without murmur Lungs: CTA without wheezes or rales Abdomen: soft, active BS, NT, ND Extremities: 1+ LE edema; hands puffy, left upper arm mildly swollen with dressing intact Dialysis Access: left lower AVGG  Qb 400  Labs: BMET  Lab 09/23/11 0945 09/22/11 0812 09/21/11 1359  NA 132* 136 137  K 4.7 3.8 3.6  CL 91* 96 93*  CO2 24 28 30   GLUCOSE 126* 87 143*  BUN 42* 28* 18  CREATININE 8.81* 7.12* 5.50*  ALB -- -- --  CALCIUM 9.2 10.9* 10.5  PHOS 4.6 -- --    CBC  Lab 09/23/11 0945 09/22/11 0812 09/21/11 1359  WBC 4.6 3.2* 2.8*  NEUTROABS -- 1.4* --  HGB 10.8* 11.2* 11.9*  HCT 32.9* 34.2* 37.5  MCV 95.1 97.2 97.7  PLT 93* 74* 83*   Liver Function Tests:  Lab 09/23/11 0945 09/21/11 1359  AST -- 37  ALT -- 22  ALKPHOS -- 155*  BILITOT -- 0.7  PROT -- 7.9  ALBUMIN 2.8* 3.0*  CBG:  Lab 09/24/11 1143 09/24/11 0743 09/23/11 2115 09/23/11 1637 09/23/11 0755  GLUCAP 131* 109* 103* 100* 91   Medications:      . calcium acetate  1,334 mg Oral TID WC  . levothyroxine  100 mcg Oral QAC breakfast  . multivitamin  1 tablet Oral QHS  . paricalcitol  8 mcg Intravenous 3 times weekly   Assessment/ Plan:    1. S/p left shoulder reverse arthroplasty - per Dr. Ranell Patrick 2. ESRD - MWF - labs drawn; HD being initiated 3. Anemia - Hgb drifting down;  resume Epo today at low dose if there is a further decrease in Hgb 4. Secondary hyperparathyroidism - last adjusted Ca 10.8; recheck today; decrease zemplar to 6 due to hypercalcemia; on 2.25 bath 5. HTN/volume - pre HD wt 73.8 with EDW 69.5; goal increased to 3500 which would still be above EDW, but don't want to stress her; has peripheral edema on exam 6. Nutrition - Albumin low; changed to high protein renal diet and add nepro daily.  Sheffield Slider, PA-C [facility] Beeper [phone]  09/25/2011,7:16 AM  LOS: 3 days

## 2011-09-25 NOTE — Progress Notes (Signed)
RIJ central line d/c'd intact. Vaseline gauze pressure dsg applied pressure held for 3 minutes no bleeding noted. Instructed family member and pt. To leave dsg on for 24 hrs and to remain in bed for 30 minutes. Colbert Ewing RN VAST.

## 2011-09-25 NOTE — Progress Notes (Signed)
Off unit.

## 2011-09-25 NOTE — Progress Notes (Signed)
I have seen and examined this patient and agree with the plan of care.  Rochelle Larue W 09/25/2011, 8:47 AM

## 2011-09-25 NOTE — Progress Notes (Signed)
Pt for d/c today, referral for DME from staff, met with pt and grandson re DME and cane and 3:1 ordered.  Johny Shock RN MPH, case manager [phone]    CARE MANAGEMENT NOTE 09/25/2011  Patient:  Samantha [name], Samantha [name]   Account Number:  [id number]  Date Initiated:  09/25/2011  Documentation initiated by:  Jaxson Anglin  Subjective/Objective Assessment:   PT/OT eval recommending single point cane and 3:1 commode.     Action/Plan:   Met with pt and grandson, who agree that pt would benefit from 3:1 and cane  both ordered from [facility].   Anticipated DC Date:  09/25/2011   Anticipated DC Plan:  HOME/SELF CARE         Choice offered to / List presented to:     DME arranged  3-N-1  CANE      DME agency  [facility]        Status of service:  Completed, signed off Medicare Important Message given?   (If response is "NO", the following Medicare IM given date fields will be blank) Date Medicare IM given:   Date Additional Medicare IM given:    Discharge Disposition:  HOME/SELF CARE  Per UR Regulation:    If discussed at Long Length of Stay Meetings, dates discussed:    Comments:  09/25/2011 Noted recommendation for DME by PT/OT eval and these ordered for pt use. Per family pt has assistance at home 24/7. Noted recommendation for [facility] however no orders received. Johny Shock RN MPH Case Manager [phone]

## 2011-11-16 ENCOUNTER — Encounter (INDEPENDENT_AMBULATORY_CARE_PROVIDER_SITE_OTHER): Payer: Self-pay | Admitting: General Surgery

## 2011-11-16 ENCOUNTER — Ambulatory Visit (INDEPENDENT_AMBULATORY_CARE_PROVIDER_SITE_OTHER): Payer: Medicaid Other | Admitting: General Surgery

## 2011-11-16 VITALS — BP 104/62 | HR 94 | Temp 97.0°F | Resp 18 | Ht 60.5 in | Wt 157.0 lb

## 2011-11-16 DIAGNOSIS — K8020 Calculus of gallbladder without cholecystitis without obstruction: Secondary | ICD-10-CM | POA: Insufficient documentation

## 2011-11-16 NOTE — Progress Notes (Signed)
Patient ID: Samantha [name], female   DOB: 10-30-1945, 66 y.o.   MRN: [id number]  Chief Complaint  Patient presents with  . Abdominal Pain    eval symptomatic gallstones    HPI Samantha [name] is a 66 y.o. female.   HPI 66 year old African American female with end-stage renal disease and esophageal varices and a history of gastric and duodenal ulcers is referred by Dr. Elnoria Howard for evaluation of her gallbladder. The patient was in the hospital in February 2013 for abdominal pain as well as anemia. She was found to have active gastric and duodenal ulcers along with esophageal varices. She was transfused 2 units of a blood. There is no active bleeding visualized on her upper endoscopy. She underwent abdominal ultrasound which demonstrated mild cirrhotic changes of her liver as well as a 1.2 cm stone in the gallbladder neck. There is no evidence of ascites and the spleen was normal. She underwent a HIDA scan which demonstrated no evidence of acute cholecystitis. It was recommended that she recover from her ulcers prior to being evaluated for potential gallbladder surgery. She has seen .Dr. Elnoria Howard followup on several occasions. She had a repeat upper endoscopy on April 30 which demonstrated no active ulcer disease. She had persistent portal hypertensive gastropathy. She continues to experience daily upper abdominal pain. It is located in her epigastric area and right upper quadrant. She describes it as sharp and severe. It generally lasts about one to one and a half hours. It has affected her appetite. She states that she is too much it hurts. She denies any fevers or chills. She denies any nausea or vomiting. She denies any melena or hematochezia. She reports daily bowel movements. She denies any trouble swallowing. She denies any acholic stools. Past Medical History  Diagnosis Date  . Renal disorder   . Shoulder pain   . Headache     occasionally  . Dizziness   . Occasional numbness/prickling/tingling of  fingers and toes   . Arthritis     left shoulder  . Joint pain   . Joint swelling   . Gastric ulcer   . Dry skin   . Peripheral edema   . Constipation   . Oligouria   . H/O: GI bleed   . Hepatitis     Hep B  . History of kidney stones   . History of blood transfusion   . Hypertension   . Anemia   . Hypothyroidism     takes Synthroid daily  . Diabetes mellitus     borderline  . Impaired hearing     Past Surgical History  Procedure Date  . Shoulder surgery 3-47yrs ago    right replacement  . Esophagogastroduodenoscopy 06/09/2011    Procedure: ESOPHAGOGASTRODUODENOSCOPY (EGD);  Surgeon: Theda Belfast, MD;  Location: [facility] ENDOSCOPY;  Service: Endoscopy;  Laterality: N/A;  . Cataract surgery     bilateral  . Reverse shoulder arthroplasty 09/22/2011    Procedure: REVERSE SHOULDER ARTHROPLASTY;  Surgeon: Verlee Rossetti, MD;  Location: [facility] OR;  Service: Orthopedics;  Laterality: Left;  left reverse shoulder arthroplasty    History reviewed. No pertinent family history.  Social History History  Substance Use Topics  . Smoking status: Never Smoker   . Smokeless tobacco: Current User    Types: Chew  . Alcohol Use: No     quit 4-102yrs ago    No Known Allergies  Current Outpatient Prescriptions  Medication Sig Dispense Refill  . calcium acetate (PHOSLO) 667  MG capsule Take 1,334 mg by mouth 3 (three) times daily with meals.      Marland Kitchen HYDROcodone-acetaminophen (NORCO) 5-325 MG per tablet Take 1 tablet by mouth every 6 (six) hours as needed. For pain      . levothyroxine (SYNTHROID, LEVOTHROID) 100 MCG tablet Take 100 mcg by mouth daily.      . Multiple Vitamin (MULITIVITAMIN WITH MINERALS) TABS Take 1 tablet by mouth daily.        Review of Systems Review of Systems  Constitutional: Negative for fever, chills and unexpected weight change.  HENT: Positive for hearing loss. Negative for nosebleeds.   Eyes:       Blurry vision - cataracts; +glasses;   Respiratory: Positive for  cough (nonproductive). Negative for shortness of breath and wheezing.   Cardiovascular: Negative for chest pain and leg swelling.       Denies SOB, orthopnea, PND. Some DOE  Gastrointestinal: Positive for nausea and abdominal pain. Negative for vomiting, diarrhea, constipation, blood in stool and rectal pain.  Genitourinary: Negative for dysuria and hematuria.       HD on M/W/F  Musculoskeletal:       Joint pain  Skin: Negative for rash and wound.  Neurological: Negative for tremors, seizures, speech difficulty and light-headedness.       Denies amaurosis fugax, TIA  Psychiatric/Behavioral: Negative for hallucinations and behavioral problems.       Doesn't sleep much at night    Blood pressure 104/62, pulse 94, temperature 97 F (36.1 C), temperature source Oral, resp. rate 18, height 5' 0.5" (1.537 m), weight 157 lb (71.215 kg).  Physical Exam Physical Exam  Vitals reviewed. Constitutional: She is oriented to person, place, and time. She appears well-developed and well-nourished. No distress.       Appears old than stated age  HENT:  Head: Normocephalic and atraumatic.  Eyes: Conjunctivae are normal. Right eye exhibits no discharge. Left eye exhibits no discharge. No scleral icterus.       Muddy sclera   Neck: Normal range of motion. Neck supple. No JVD present. No tracheal deviation present.  Cardiovascular: Normal rate, regular rhythm and normal heart sounds.        L forearm AV fistula - +bruit  Pulmonary/Chest: Effort normal and breath sounds normal. No respiratory distress. She has no wheezes.  Abdominal: Soft. Bowel sounds are normal. She exhibits no distension. There is tenderness (mild TTP in epigastric/RUQ). There is no rebound and no guarding.  Musculoskeletal: She exhibits no edema and no tenderness.  Lymphadenopathy:    She has no cervical adenopathy.  Neurological: She is alert and oriented to person, place, and time. No cranial nerve deficit. She exhibits normal  muscle tone.  Skin: Skin is warm and dry. She is not diaphoretic.  Psychiatric: She has a normal mood and affect. Her behavior is normal. Thought content normal.    Data Reviewed Hospital d/c summaries from Feb 2013; June 2013 Dr Haywood Pao notes from 7/30, 4/15 EGD note from 4/30 - portal hypertensive gastropathy, medium sized varices, no ulcers Labs from 09/2011 - hgb 9.7, hct 29, plt 74; PT 15.1, INR 1.17, PTT 36 HIDA scan  ABDOMINAL ULTRASOUND COMPLETE 05/2011 Comparison: Abdominal radiograph performed earlier today at 08:24  p.m.  Findings:  Gallbladder: There appears to be a 1.2 cm stone lodged at the neck  of the gallbladder. The gallbladder wall is mildly thickened and  edematous, with suggestion of trace associated pericholecystic  fluid. A positive ultrasonographic Murphy's sign is  seen.  Findings raise concern for mild acute cholecystitis.  Common Bile Duct: 0.5 cm in diameter; within normal limits in  caliber.  Liver: Heterogeneous echotexture, with a mildly nodular contour,  raising question for mild cirrhotic change; no focal lesions  identified. Limited Doppler evaluation demonstrates normal blood  flow within the liver.  IVC: Unremarkable in appearance.  Pancreas: Although the pancreas is difficult to visualize in its  entirety due to overlying bowel gas, no focal pancreatic  abnormality is identified.  Spleen: 10.2 cm in length; within normal limits in size and  echotexture.  Right kidney: 5.7 cm in length; markedly diminutive, with  diffusely increased cortical echogenicity, reflecting the patient's  chronic renal atrophy. A few small cysts are suggested. No  suspicious mass seen; no evidence of hydronephrosis.  Left kidney: 5.5 cm in length; markedly diminutive, with diffusely  increased cortical echogenicity, reflecting the patient's chronic  renal atrophy. A few small cysts are noted. No suspicious mass  seen; no evidence of hydronephrosis.  Abdominal Aorta:  Normal in caliber; no aneurysm identified. The  aortic bifurcation is not visualized due to overlying bowel gas.   IMPRESSION:  1. Suspect mild acute cholecystitis, with a 1.2 cm stone lodged at  the neck of the gallbladder, and mild gallbladder wall thickening  and edema, with suggestion of trace pericholecystic fluid.  Positive ultrasonographic Murphy's sign elicited. No evidence of  biliary duct dilatation to suggest more distal obstruction.  2. Mildly nodular contour of the liver raises question for mild  cirrhotic change.  3. Chronic bilateral renal atrophy noted, with few small scattered  cysts seen.   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  We discussed gallbladder disease. The patient was given Agricultural engineer. We discussed non-operative and operative management. We discussed the signs & symptoms of acute cholecystitis  I discussed laparoscopic cholecystectomy with IOC in detail.  The patient was given educational material as well as diagrams detailing the procedure.  We discussed the risks and benefits of a laparoscopic cholecystectomy including, but not limited to bleeding, infection, injury to surrounding structures such as the intestine or liver, bile leak, retained gallstones, need to convert to an open procedure, prolonged diarrhea, blood clots such as  DVT, common bile duct injury, anesthesia risks, and possible need for additional procedures.  We discussed the typical post-operative recovery course. I explained that the likelihood of improvement of their symptoms is 70-80%.  I explained that she Is at higher risk for bleeding and operative mortality given her cirrhosis. I explained that she may require a transfusion of platelets or blood during her hospitalization. Fortunately her coagulation profile  appears normal when it was last checked.  The patient has elected to proceed with surgery. We will schedule surgery on the day when she is not undergoing dialysis. I explained that she will require at least an overnight hospitalization. We will repeat her liver function tests, basic metabolic panel, CBC, and coagulation profile prior to surgery  Mary Sella. Andrey Campanile, MD, FACS General, Bariatric, & Minimally Invasive Surgery [facility] Surgery, [place]         [facility] M 11/16/2011, 9:45 AM

## 2011-11-16 NOTE — Patient Instructions (Signed)

## 2011-11-21 ENCOUNTER — Encounter (INDEPENDENT_AMBULATORY_CARE_PROVIDER_SITE_OTHER): Payer: Medicare Other | Admitting: Ophthalmology

## 2011-11-21 DIAGNOSIS — H43819 Vitreous degeneration, unspecified eye: Secondary | ICD-10-CM

## 2011-11-21 DIAGNOSIS — H35379 Puckering of macula, unspecified eye: Secondary | ICD-10-CM

## 2011-11-21 DIAGNOSIS — H3530 Unspecified macular degeneration: Secondary | ICD-10-CM

## 2011-12-12 ENCOUNTER — Encounter (INDEPENDENT_AMBULATORY_CARE_PROVIDER_SITE_OTHER): Payer: Self-pay

## 2011-12-25 ENCOUNTER — Other Ambulatory Visit (INDEPENDENT_AMBULATORY_CARE_PROVIDER_SITE_OTHER): Payer: Self-pay | Admitting: General Surgery

## 2011-12-25 ENCOUNTER — Encounter (HOSPITAL_COMMUNITY): Payer: Self-pay

## 2011-12-25 MED ORDER — DEXTROSE 5 % IV SOLN
2.0000 g | INTRAVENOUS | Status: AC
  Administered 2011-12-26: 2 g via INTRAVENOUS
  Filled 2011-12-25: qty 2

## 2011-12-26 ENCOUNTER — Encounter (HOSPITAL_COMMUNITY): Payer: Self-pay | Admitting: Anesthesiology

## 2011-12-26 ENCOUNTER — Ambulatory Visit (HOSPITAL_COMMUNITY): Payer: Medicare Other

## 2011-12-26 ENCOUNTER — Encounter (HOSPITAL_COMMUNITY): Payer: Self-pay | Admitting: General Practice

## 2011-12-26 ENCOUNTER — Ambulatory Visit (HOSPITAL_COMMUNITY): Payer: Medicare Other | Admitting: Anesthesiology

## 2011-12-26 ENCOUNTER — Ambulatory Visit (HOSPITAL_COMMUNITY)
Admission: RE | Admit: 2011-12-26 | Discharge: 2011-12-28 | Disposition: A | Discharge status: Home / Self Care | Payer: Medicare Other | Source: Ambulatory Visit | Attending: General Surgery | Admitting: General Surgery

## 2011-12-26 ENCOUNTER — Encounter (HOSPITAL_COMMUNITY)
Admission: RE | Disposition: A | Discharge status: Home / Self Care | Payer: Self-pay | Source: Ambulatory Visit | Attending: General Surgery

## 2011-12-26 DIAGNOSIS — K7460 Unspecified cirrhosis of liver: Secondary | ICD-10-CM | POA: Insufficient documentation

## 2011-12-26 DIAGNOSIS — E119 Type 2 diabetes mellitus without complications: Secondary | ICD-10-CM | POA: Insufficient documentation

## 2011-12-26 DIAGNOSIS — I120 Hypertensive chronic kidney disease with stage 5 chronic kidney disease or end stage renal disease: Secondary | ICD-10-CM | POA: Insufficient documentation

## 2011-12-26 DIAGNOSIS — Z8711 Personal history of peptic ulcer disease: Secondary | ICD-10-CM | POA: Insufficient documentation

## 2011-12-26 DIAGNOSIS — E039 Hypothyroidism, unspecified: Secondary | ICD-10-CM | POA: Insufficient documentation

## 2011-12-26 DIAGNOSIS — K8020 Calculus of gallbladder without cholecystitis without obstruction: Secondary | ICD-10-CM | POA: Insufficient documentation

## 2011-12-26 DIAGNOSIS — N186 End stage renal disease: Secondary | ICD-10-CM | POA: Insufficient documentation

## 2011-12-26 DIAGNOSIS — K8010 Calculus of gallbladder with chronic cholecystitis without obstruction: Secondary | ICD-10-CM

## 2011-12-26 DIAGNOSIS — M19019 Primary osteoarthritis, unspecified shoulder: Secondary | ICD-10-CM | POA: Insufficient documentation

## 2011-12-26 HISTORY — PX: CHOLECYSTECTOMY: SHX55

## 2011-12-26 LAB — APTT: aPTT: 32 seconds (ref 24–37)

## 2011-12-26 LAB — PROTIME-INR
INR: 1.34 (ref 0.00–1.49)
Prothrombin Time: 16.8 seconds — ABNORMAL HIGH (ref 11.6–15.2)

## 2011-12-26 LAB — CBC WITH DIFFERENTIAL/PLATELET
Basophils Relative: 2 % — ABNORMAL HIGH (ref 0–1)
Eosinophils Relative: 5 % (ref 0–5)
HCT: 33.8 % — ABNORMAL LOW (ref 36.0–46.0)
Hemoglobin: 11.3 g/dL — ABNORMAL LOW (ref 12.0–15.0)
Lymphs Abs: 0.9 10*3/uL (ref 0.7–4.0)
MCH: 32.5 pg (ref 26.0–34.0)
MCV: 97.1 fL (ref 78.0–100.0)
Monocytes Absolute: 0.3 10*3/uL (ref 0.1–1.0)
Neutrophils Relative %: 39 % — ABNORMAL LOW (ref 43–77)
RBC: 3.48 MIL/uL — ABNORMAL LOW (ref 3.87–5.11)

## 2011-12-26 LAB — GLUCOSE, CAPILLARY: Glucose-Capillary: 121 mg/dL — ABNORMAL HIGH (ref 70–99)

## 2011-12-26 LAB — SURGICAL PCR SCREEN
MRSA, PCR: NEGATIVE
Staphylococcus aureus: NEGATIVE

## 2011-12-26 LAB — TYPE AND SCREEN
ABO/RH(D): A POS
Antibody Screen: NEGATIVE

## 2011-12-26 LAB — COMPREHENSIVE METABOLIC PANEL
AST: 46 U/L — ABNORMAL HIGH (ref 0–37)
Albumin: 3.2 g/dL — ABNORMAL LOW (ref 3.5–5.2)
BUN: 16 mg/dL (ref 6–23)
Calcium: 9.6 mg/dL (ref 8.4–10.5)
Creatinine, Ser: 5.69 mg/dL — ABNORMAL HIGH (ref 0.50–1.10)
Total Bilirubin: 0.6 mg/dL (ref 0.3–1.2)
Total Protein: 7.7 g/dL (ref 6.0–8.3)

## 2011-12-26 SURGERY — LAPAROSCOPIC CHOLECYSTECTOMY
Anesthesia: General | Site: Abdomen | Wound class: Clean Contaminated

## 2011-12-26 MED ORDER — OXYCODONE HCL 5 MG/5ML PO SOLN: 5.0000 mg | Freq: Once | ORAL | Status: DC | PRN

## 2011-12-26 MED ORDER — ACETAMINOPHEN 650 MG RE SUPP: 650.0000 mg | Freq: Four times a day (QID) | RECTAL | Status: DC | PRN

## 2011-12-26 MED ORDER — HYDROXYZINE HCL 25 MG PO TABS
25.0000 mg | ORAL_TABLET | Freq: Three times a day (TID) | ORAL | Status: DC | PRN
  Filled 2011-12-26: qty 1

## 2011-12-26 MED ORDER — CHLORHEXIDINE GLUCONATE 4 % EX LIQD: 1.0000 "application " | Freq: Once | CUTANEOUS | Status: DC

## 2011-12-26 MED ORDER — PARICALCITOL 5 MCG/ML IV SOLN
5.0000 ug | INTRAVENOUS | Status: DC
  Administered 2011-12-27: 5 ug via INTRAVENOUS
  Filled 2011-12-26: qty 1

## 2011-12-26 MED ORDER — BUPIVACAINE-EPINEPHRINE 0.25% -1:200000 IJ SOLN
INTRAMUSCULAR | Status: DC | PRN
  Administered 2011-12-26: 30 mL

## 2011-12-26 MED ORDER — FENTANYL CITRATE 0.05 MG/ML IJ SOLN
INTRAMUSCULAR | Status: DC | PRN
  Administered 2011-12-26: 100 ug via INTRAVENOUS
  Administered 2011-12-26: 50 ug via INTRAVENOUS

## 2011-12-26 MED ORDER — GLYCOPYRROLATE 0.2 MG/ML IJ SOLN
INTRAMUSCULAR | Status: DC | PRN
  Administered 2011-12-26: .4 mg via INTRAVENOUS

## 2011-12-26 MED ORDER — PROPOFOL 10 MG/ML IV BOLUS
INTRAVENOUS | Status: DC | PRN
  Administered 2011-12-26: 110 mg via INTRAVENOUS

## 2011-12-26 MED ORDER — ONDANSETRON HCL 4 MG/2ML IJ SOLN
INTRAMUSCULAR | Status: DC | PRN
  Administered 2011-12-26: 4 mg via INTRAVENOUS

## 2011-12-26 MED ORDER — DOCUSATE SODIUM 283 MG RE ENEM
1.0000 | ENEMA | RECTAL | Status: DC | PRN
  Filled 2011-12-26: qty 1

## 2011-12-26 MED ORDER — DEXTROSE 5 % IV SOLN: 2.0000 g | INTRAVENOUS | Status: DC

## 2011-12-26 MED ORDER — ROCURONIUM BROMIDE 100 MG/10ML IV SOLN
INTRAVENOUS | Status: DC | PRN
  Administered 2011-12-26: 30 mg via INTRAVENOUS

## 2011-12-26 MED ORDER — HYDROMORPHONE HCL PF 1 MG/ML IJ SOLN
0.2500 mg | INTRAMUSCULAR | Status: DC | PRN
  Administered 2011-12-26 (×4): 0.25 mg via INTRAVENOUS

## 2011-12-26 MED ORDER — PHENYLEPHRINE HCL 10 MG/ML IJ SOLN
10.0000 mg | INTRAVENOUS | Status: DC | PRN
  Administered 2011-12-26: 10 ug/min via INTRAVENOUS

## 2011-12-26 MED ORDER — CALCIUM ACETATE 667 MG PO CAPS
1334.0000 mg | ORAL_CAPSULE | Freq: Three times a day (TID) | ORAL | Status: DC
  Administered 2011-12-26 – 2011-12-28 (×5): 1334 mg via ORAL
  Filled 2011-12-26 (×9): qty 2

## 2011-12-26 MED ORDER — SORBITOL 70 % SOLN
30.0000 mL | Status: DC | PRN
  Filled 2011-12-26: qty 30

## 2011-12-26 MED ORDER — CALCIUM CARBONATE 1250 MG/5ML PO SUSP
500.0000 mg | Freq: Four times a day (QID) | ORAL | Status: DC | PRN
  Filled 2011-12-26: qty 5

## 2011-12-26 MED ORDER — SODIUM CHLORIDE 0.9 % IV SOLN
INTRAVENOUS | Status: DC | PRN
  Administered 2011-12-26: 08:00:00 via INTRAVENOUS

## 2011-12-26 MED ORDER — HEMOSTATIC AGENTS (NO CHARGE) OPTIME
TOPICAL | Status: DC | PRN
  Administered 2011-12-26 (×2): 1

## 2011-12-26 MED ORDER — SODIUM CHLORIDE 0.9 % IR SOLN
Status: DC | PRN
  Administered 2011-12-26: 1

## 2011-12-26 MED ORDER — ONDANSETRON HCL 4 MG PO TABS: 4.0000 mg | ORAL_TABLET | Freq: Four times a day (QID) | ORAL | Status: DC | PRN

## 2011-12-26 MED ORDER — ACETAMINOPHEN 325 MG PO TABS
650.0000 mg | ORAL_TABLET | Freq: Four times a day (QID) | ORAL | Status: DC | PRN
  Administered 2011-12-27: 650 mg via ORAL
  Filled 2011-12-26: qty 2

## 2011-12-26 MED ORDER — NEPRO/CARBSTEADY PO LIQD
237.0000 mL | Freq: Three times a day (TID) | ORAL | Status: DC | PRN
  Filled 2011-12-26: qty 237

## 2011-12-26 MED ORDER — NEOSTIGMINE METHYLSULFATE 1 MG/ML IJ SOLN
INTRAMUSCULAR | Status: DC | PRN
  Administered 2011-12-26: 3 mg via INTRAVENOUS

## 2011-12-26 MED ORDER — SODIUM CHLORIDE 0.9 % IV SOLN: INTRAVENOUS | Status: DC | PRN

## 2011-12-26 MED ORDER — SODIUM CHLORIDE 0.9 % IR SOLN
Status: DC | PRN
  Administered 2011-12-26 (×2): 1

## 2011-12-26 MED ORDER — SODIUM CHLORIDE 0.9 % IV SOLN
INTRAVENOUS | Status: DC
  Administered 2011-12-26 – 2011-12-27 (×3): via INTRAVENOUS

## 2011-12-26 MED ORDER — MUPIROCIN 2 % EX OINT
TOPICAL_OINTMENT | CUTANEOUS | Status: AC
  Filled 2011-12-26: qty 22

## 2011-12-26 MED ORDER — ZOLPIDEM TARTRATE 5 MG PO TABS: 5.0000 mg | ORAL_TABLET | Freq: Every evening | ORAL | Status: DC | PRN

## 2011-12-26 MED ORDER — BUPIVACAINE-EPINEPHRINE PF 0.25-1:200000 % IJ SOLN
INTRAMUSCULAR | Status: AC
  Filled 2011-12-26: qty 30

## 2011-12-26 MED ORDER — HYDROCODONE-ACETAMINOPHEN 5-325 MG PO TABS
1.0000 | ORAL_TABLET | ORAL | Status: DC | PRN
  Administered 2011-12-26: 2 via ORAL
  Administered 2011-12-28: 1 via ORAL
  Filled 2011-12-26 (×2): qty 2
  Filled 2011-12-26: qty 1

## 2011-12-26 MED ORDER — OXYCODONE HCL 5 MG PO TABS: 5.0000 mg | ORAL_TABLET | Freq: Once | ORAL | Status: DC | PRN

## 2011-12-26 MED ORDER — LEVOTHYROXINE SODIUM 100 MCG PO TABS
100.0000 ug | ORAL_TABLET | Freq: Every day | ORAL | Status: DC
  Administered 2011-12-27 – 2011-12-28 (×2): 100 ug via ORAL
  Filled 2011-12-26 (×3): qty 1

## 2011-12-26 MED ORDER — DROPERIDOL 2.5 MG/ML IJ SOLN: 0.6250 mg | INTRAMUSCULAR | Status: DC | PRN

## 2011-12-26 MED ORDER — ONDANSETRON HCL 4 MG/2ML IJ SOLN: 4.0000 mg | Freq: Four times a day (QID) | INTRAMUSCULAR | Status: DC | PRN

## 2011-12-26 MED ORDER — CAMPHOR-MENTHOL 0.5-0.5 % EX LOTN
1.0000 "application " | TOPICAL_LOTION | Freq: Three times a day (TID) | CUTANEOUS | Status: DC | PRN
  Filled 2011-12-26: qty 222

## 2011-12-26 MED ORDER — MORPHINE SULFATE 2 MG/ML IJ SOLN: 1.0000 mg | INTRAMUSCULAR | Status: DC | PRN

## 2011-12-26 SURGICAL SUPPLY — 51 items
APPLIER CLIP 5 13 M/L LIGAMAX5 (MISCELLANEOUS) ×3
BANDAGE ADHESIVE 1X3 (GAUZE/BANDAGES/DRESSINGS) IMPLANT
BENZOIN TINCTURE PRP APPL 2/3 (GAUZE/BANDAGES/DRESSINGS) ×3 IMPLANT
BLADE SURG ROTATE 9660 (MISCELLANEOUS) IMPLANT
CANISTER SUCTION 2500CC (MISCELLANEOUS) ×3 IMPLANT
CHLORAPREP W/TINT 26ML (MISCELLANEOUS) ×3 IMPLANT
CLIP APPLIE 5 13 M/L LIGAMAX5 (MISCELLANEOUS) ×2 IMPLANT
CLOTH BEACON ORANGE TIMEOUT ST (SAFETY) ×3 IMPLANT
COVER MAYO STAND STRL (DRAPES) ×3 IMPLANT
COVER SURGICAL LIGHT HANDLE (MISCELLANEOUS) ×3 IMPLANT
DECANTER SPIKE VIAL GLASS SM (MISCELLANEOUS) IMPLANT
DERMABOND ADVANCED (GAUZE/BANDAGES/DRESSINGS) ×1
DERMABOND ADVANCED .7 DNX12 (GAUZE/BANDAGES/DRESSINGS) ×2 IMPLANT
DRAPE C-ARM 42X72 X-RAY (DRAPES) ×3 IMPLANT
DRAPE UTILITY 15X26 W/TAPE STR (DRAPE) ×6 IMPLANT
DRSG TEGADERM 4X4.75 (GAUZE/BANDAGES/DRESSINGS) IMPLANT
ELECT REM PT RETURN 9FT ADLT (ELECTROSURGICAL) ×3
ELECTRODE REM PT RTRN 9FT ADLT (ELECTROSURGICAL) ×2 IMPLANT
GAUZE SPONGE 2X2 8PLY STRL LF (GAUZE/BANDAGES/DRESSINGS) IMPLANT
GLOVE BIO SURGEON STRL SZ 6 (GLOVE) ×3 IMPLANT
GLOVE BIO SURGEON STRL SZ 6.5 (GLOVE) ×6 IMPLANT
GLOVE BIOGEL M STRL SZ7.5 (GLOVE) ×3 IMPLANT
GLOVE BIOGEL PI IND STRL 6.5 (GLOVE) ×4 IMPLANT
GLOVE BIOGEL PI IND STRL 7.0 (GLOVE) ×2 IMPLANT
GLOVE BIOGEL PI IND STRL 8 (GLOVE) ×4 IMPLANT
GLOVE BIOGEL PI INDICATOR 6.5 (GLOVE) ×2
GLOVE BIOGEL PI INDICATOR 7.0 (GLOVE) ×1
GLOVE BIOGEL PI INDICATOR 8 (GLOVE) ×2
GLOVE ECLIPSE 6.5 STRL STRAW (GLOVE) ×6 IMPLANT
GLOVE ECLIPSE 8.0 STRL XLNG CF (GLOVE) ×3 IMPLANT
GOWN STRL NON-REIN LRG LVL3 (GOWN DISPOSABLE) ×12 IMPLANT
GOWN STRL REIN XL XLG (GOWN DISPOSABLE) ×3 IMPLANT
HEMOSTAT SNOW SURGICEL 2X4 (HEMOSTASIS) ×3 IMPLANT
KIT BASIN OR (CUSTOM PROCEDURE TRAY) ×3 IMPLANT
KIT ROOM TURNOVER OR (KITS) ×3 IMPLANT
NS IRRIG 1000ML POUR BTL (IV SOLUTION) ×3 IMPLANT
PAD ARMBOARD 7.5X6 YLW CONV (MISCELLANEOUS) ×3 IMPLANT
POUCH SPECIMEN RETRIEVAL 10MM (ENDOMECHANICALS) ×3 IMPLANT
SCISSORS LAP 5X35 DISP (ENDOMECHANICALS) ×3 IMPLANT
SET CHOLANGIOGRAPH 5 50 .035 (SET/KITS/TRAYS/PACK) ×3 IMPLANT
SET IRRIG TUBING LAPAROSCOPIC (IRRIGATION / IRRIGATOR) ×3 IMPLANT
SLEEVE ENDOPATH XCEL 5M (ENDOMECHANICALS) ×6 IMPLANT
SPECIMEN JAR SMALL (MISCELLANEOUS) ×3 IMPLANT
SPONGE GAUZE 2X2 STER 10/PKG (GAUZE/BANDAGES/DRESSINGS)
SUT MNCRL AB 4-0 PS2 18 (SUTURE) ×3 IMPLANT
SUT VICRYL 0 UR6 27IN ABS (SUTURE) ×3 IMPLANT
TOWEL OR 17X24 6PK STRL BLUE (TOWEL DISPOSABLE) ×3 IMPLANT
TOWEL OR 17X26 10 PK STRL BLUE (TOWEL DISPOSABLE) ×3 IMPLANT
TRAY LAPAROSCOPIC (CUSTOM PROCEDURE TRAY) ×3 IMPLANT
TROCAR XCEL BLUNT TIP 100MML (ENDOMECHANICALS) ×3 IMPLANT
TROCAR XCEL NON-BLD 5MMX100MML (ENDOMECHANICALS) ×3 IMPLANT

## 2011-12-26 NOTE — Preoperative (Signed)
Beta Blockers   Reason not to administer Beta Blockers:Not Applicable 

## 2011-12-26 NOTE — Progress Notes (Signed)
Patient admitted to [address] room 17. S/P lap chole. Drowsy, easily aroused. Abdominal sites x4. IV fluids infusing. Oriented to room. Instructed to call for assistance, esp OOB. Call bell in reach. Family in to visit.

## 2011-12-26 NOTE — Transfer of Care (Signed)
Immediate Anesthesia Transfer of Care Note  Patient: Samantha [name]  Procedure(s) Performed: Procedure(s) (LRB) with comments: LAPAROSCOPIC CHOLECYSTECTOMY (N/A)  Patient Location: PACU  Anesthesia Type: General  Level of Consciousness: awake and alert   Airway & Oxygen Therapy: Patient Spontanous Breathing and Patient connected to face mask oxygen  Post-op Assessment: Report given to PACU RN, Post -op Vital signs reviewed and stable, Patient moving all extremities and Patient moving all extremities X 4  Post vital signs: Reviewed and stable  Complications: No apparent anesthesia complications

## 2011-12-26 NOTE — H&P (Signed)
Samantha [name] is an 66 y.o. female.   Chief Complaint: here for surgery HPI: 66 year old African American female with end-stage renal disease and esophageal varices and a history of gastric and duodenal ulcers is referred by Dr. Elnoria Howard for evaluation of her gallbladder. The patient was in the hospital in February 2013 for abdominal pain as well as anemia. She was found to have active gastric and duodenal ulcers along with esophageal varices. She was transfused 2 units of a blood. There is no active bleeding visualized on her upper endoscopy. She underwent abdominal ultrasound which demonstrated mild cirrhotic changes of her liver as well as a 1.2 cm stone in the gallbladder neck. There is no evidence of ascites and the spleen was normal. She underwent a HIDA scan which demonstrated no evidence of acute cholecystitis. It was recommended that she recover from her ulcers prior to being evaluated for potential gallbladder surgery. She has seen .Dr. Elnoria Howard followup on several occasions. She had a repeat upper endoscopy on April 30 which demonstrated no active ulcer disease. She had persistent portal hypertensive gastropathy. She continues to experience daily upper abdominal pain. It is located in her epigastric area and right upper quadrant. She describes it as sharp and severe. It generally lasts about one to one and a half hours. It has affected her appetite. She states that she is too much it hurts. She denies any fevers or chills. She denies any nausea or vomiting. She denies any melena or hematochezia. She reports daily bowel movements. She denies any trouble swallowing. She denies any acholic stools.   Past Medical History  Diagnosis Date  . Shoulder pain   . Headache     occasionally  . Dizziness   . Occasional numbness/prickling/tingling of fingers and toes   . Arthritis     left shoulder  . Joint pain   . Joint swelling   . Gastric ulcer   . Dry skin   . Peripheral edema   . Constipation   .  Oligouria   . H/O: GI bleed   . Hepatitis     Hep B  . History of kidney stones   . History of blood transfusion   . Hypertension   . Anemia   . Hypothyroidism     takes Synthroid daily  . Impaired hearing   . Diabetes mellitus     borderline  . Renal disorder     m, w, F     Past Surgical History  Procedure Date  . Shoulder surgery 3-41yrs ago    right replacement  . Esophagogastroduodenoscopy 06/09/2011    Procedure: ESOPHAGOGASTRODUODENOSCOPY (EGD);  Surgeon: Theda Belfast, MD;  Location: [facility] ENDOSCOPY;  Service: Endoscopy;  Laterality: N/A;  . Cataract surgery     bilateral  . Reverse shoulder arthroplasty 09/22/2011    Procedure: REVERSE SHOULDER ARTHROPLASTY;  Surgeon: Verlee Rossetti, MD;  Location: [facility] OR;  Service: Orthopedics;  Laterality: Left;  left reverse shoulder arthroplasty    History reviewed. No pertinent family history. Social History:  reports that she has never smoked. Her smokeless tobacco use includes Chew. She reports that she does not drink alcohol or use illicit drugs.  Allergies: No Known Allergies  Medications Prior to Admission  Medication Sig Dispense Refill  . calcium acetate (PHOSLO) 667 MG capsule Take 1,334 mg by mouth 3 (three) times daily with meals.      Marland Kitchen HYDROcodone-acetaminophen (NORCO) 5-325 MG per tablet Take 1 tablet by mouth every 6 (six) hours  as needed. For pain      . levothyroxine (SYNTHROID, LEVOTHROID) 100 MCG tablet Take 100 mcg by mouth daily.      . Multiple Vitamin (MULITIVITAMIN WITH MINERALS) TABS Take 1 tablet by mouth daily.        Results for orders placed during the hospital encounter of 12/26/11 (from the past 48 hour(s))  GLUCOSE, CAPILLARY     Status: Abnormal   Collection Time   12/26/11  6:21 AM      Component Value Range Comment   Glucose-Capillary 65 (*) 70 - 99 mg/dL    Comment 1 Documented in Chart      Comment 2 Notify RN     CBC WITH DIFFERENTIAL     Status: Abnormal (Preliminary result)   Collection  Time   12/26/11  6:52 AM      Component Value Range Comment   WBC 2.1 (*) 4.0 - 10.5 K/uL    RBC 3.48 (*) 3.87 - 5.11 MIL/uL    Hemoglobin 11.3 (*) 12.0 - 15.0 g/dL    HCT 16.1 (*) 09.6 - 46.0 %    MCV 97.1  78.0 - 100.0 fL    MCH 32.5  26.0 - 34.0 pg    MCHC 33.4  30.0 - 36.0 g/dL    RDW 04.5  40.9 - 81.1 %    Platelets PENDING  150 - 400 K/uL    Neutrophils Relative PENDING  43 - 77 %    Neutro Abs PENDING  1.7 - 7.7 K/uL    Band Neutrophils PENDING  0 - 10 %    Lymphocytes Relative PENDING  12 - 46 %    Lymphs Abs PENDING  0.7 - 4.0 K/uL    Monocytes Relative PENDING  3 - 12 %    Monocytes Absolute PENDING  0.1 - 1.0 K/uL    Eosinophils Relative PENDING  0 - 5 %    Eosinophils Absolute PENDING  0.0 - 0.7 K/uL    Basophils Relative PENDING  0 - 1 %    Basophils Absolute PENDING  0.0 - 0.1 K/uL    WBC Morphology PENDING      RBC Morphology PENDING      Smear Review PENDING      nRBC PENDING  0 /100 WBC    Metamyelocytes Relative PENDING      Myelocytes PENDING      Promyelocytes Absolute PENDING      Blasts PENDING      No results found.  Review of Systems  Constitutional: Negative for fever and chills.  HENT:       Chronic hoarse voice  Eyes: Negative for photophobia.  Respiratory: Negative for shortness of breath.   Cardiovascular: Negative for chest pain, palpitations and PND.       Some DOE; denies PND  Gastrointestinal: Positive for abdominal pain. Negative for nausea and vomiting.  Genitourinary:       ESRD HD on M/W/F  Musculoskeletal:       Joint pain  Neurological: Negative for sensory change, speech change, seizures and loss of consciousness.  Psychiatric/Behavioral: Negative for substance abuse.    Blood pressure 105/63, pulse 82, temperature 98 F (36.7 C), temperature source Oral, resp. rate 18, height 5\' 2"  (1.575 m), weight 160 lb (72.576 kg), SpO2 100.00%. Physical Exam  Vitals reviewed. Constitutional: No distress.  HENT:  Head: Normocephalic  and atraumatic.  Right Ear: External ear normal.  Left Ear: External ear normal.  Eyes:  Muddy sclera  Neck: Normal range of motion. No JVD present. No tracheal deviation present.       Hoarse voice  Cardiovascular: Normal rate.        Left forearm av fistula  Respiratory: Effort normal and breath sounds normal. No respiratory distress. She has no wheezes.  GI: Soft. Bowel sounds are normal. She exhibits no distension. There is no tenderness.  Musculoskeletal: She exhibits no edema.  Neurological: She is alert.  Skin: Skin is warm and dry. She is not diaphoretic.  Psychiatric: She has a normal mood and affect. Her behavior is normal.     Assessment/Plan Symptomatic cholelithiasis/chronic cholecystitis.  To OR for Lap chole with IOC  ESRD HTN H/o esophageal varices  Mary Sella. Andrey Campanile, MD, FACS General, Bariatric, & Minimally Invasive Surgery [facility] Surgery, [place]   [facility] M 12/26/2011, 7:28 AM

## 2011-12-26 NOTE — Anesthesia Postprocedure Evaluation (Signed)
Anesthesia Post Note  Patient: Samantha [name]  Procedure(s) Performed: Procedure(s) (LRB): LAPAROSCOPIC CHOLECYSTECTOMY (N/A)  Anesthesia type: general  Patient location: PACU  Post pain: Pain level controlled  Post assessment: Patient's Cardiovascular Status Stable  Last Vitals:  Filed Vitals:   12/26/11 1042  BP: 124/57  Pulse: 81  Temp:   Resp: 12    Post vital signs: Reviewed and stable  Level of consciousness: sedated  Complications: No apparent anesthesia complications

## 2011-12-26 NOTE — OR Nursing (Signed)
Preoperatively AV fistula assessed - bounding pulse felt.  Oralia Manis, RN

## 2011-12-26 NOTE — Anesthesia Preprocedure Evaluation (Addendum)
Anesthesia Evaluation  Patient identified by MRN, date of birth, ID band Patient awake    Reviewed: Allergy & Precautions, H&P , NPO status , Patient's Chart, lab work & pertinent test results  History of Anesthesia Complications Negative for: history of anesthetic complications  Airway Mallampati: I TM Distance: >3 FB Neck ROM: Full    Dental  (+) Edentulous Upper, Edentulous Lower and Dental Advisory Given   Pulmonary neg pulmonary ROS,  breath sounds clear to auscultation  Pulmonary exam normal       Cardiovascular hypertension, Rhythm:Regular Rate:Normal     Neuro/Psych negative neurological ROS     GI/Hepatic PUD, (+) Hepatitis -, B  Endo/Other  diabetesHypothyroidism   Renal/GU CRFRenal disease     Musculoskeletal   Abdominal   Peds  Hematology   Anesthesia Other Findings   Reproductive/Obstetrics                          Anesthesia Physical Anesthesia Plan  ASA: III  Anesthesia Plan: General   Post-op Pain Management:    Induction: Intravenous  Airway Management Planned: Oral ETT  Additional Equipment:   Intra-op Plan:   Post-operative Plan: Extubation in OR  Informed Consent: I have reviewed the patients History and Physical, chart, labs and discussed the procedure including the risks, benefits and alternatives for the proposed anesthesia with the patient or authorized representative who has indicated his/her understanding and acceptance.   Dental advisory given  Plan Discussed with: CRNA, Anesthesiologist and Surgeon  Anesthesia Plan Comments:         Anesthesia Quick Evaluation

## 2011-12-26 NOTE — Consult Note (Signed)
[facility] Renal Consultation Note    Indication for Consultation:  Management of ESRD/hemodialysis; anemia, hypertension/volume and secondary hyperparathyroidism  HPI: Samantha [name] is a 66 y.o. female with ESRD on HD since January of 2012 with a history of chronic symptomatic cholecystitis and gall stone pancreatitis 05/2011 as well as cirrhosis, esophageal varices, GIB secondary to gastric ulcer at the same time, which required transfusion of 2 units PRBC.  She is admitted following elective lap chole today. During surgery today she was noted to have an extremely nodular heavy cirrhotic liver. At present she c/o a dry mouth and abdomen is a little sore.  No SOB, CP, N, V or chills  Past Medical History  Diagnosis Date  . Shoulder pain   . Headache     occasionally  . Dizziness   . Occasional numbness/prickling/tingling of fingers and toes   . Arthritis     left shoulder  . Joint pain   . Joint swelling   . Gastric ulcer   . Dry skin   . Peripheral edema   . Constipation   . Oligouria   . H/O: GI bleed   . Hepatitis     Hep B  . History of kidney stones   . History of blood transfusion   . Hypertension   . Anemia   . Hypothyroidism     takes Synthroid daily  . Impaired hearing   . Diabetes mellitus     borderline  . Renal disorder     m, w, F    Past Surgical History  Procedure Date  . Shoulder surgery 3-46yrs ago    right replacement  . Esophagogastroduodenoscopy 06/09/2011    Procedure: ESOPHAGOGASTRODUODENOSCOPY (EGD);  Surgeon: Theda Belfast, MD;  Location: [facility];  Service: Endoscopy;  Laterality: N/A;  . Cataract surgery     bilateral  . Reverse shoulder arthroplasty 09/22/2011    Procedure: REVERSE SHOULDER ARTHROPLASTY;  Surgeon: Verlee Rossetti, MD;  [facility];  Service: Orthopedics;  Laterality: Left;  left reverse shoulder arthroplasty  . Cholecystectomy 12/26/2011   History reviewed. No pertinent family history. Social  History: Love prior history of heavy alcohol intake stopped about 3 or years ago.  reports that she has never smoked. Her smokeless tobacco use includes Chew. She reports that she does not drink alcohol or use illicit drugs. No Known Allergies Prior to Admission medications   Medication Sig Start Date End Date Taking? Authorizing Provider  calcium acetate (PHOSLO) 667 MG capsule Take 1,334 mg by mouth 3 (three) times daily with meals.   Yes Historical Provider, MD  HYDROcodone-acetaminophen (NORCO) 5-325 MG per tablet Take 1 tablet by mouth every 6 (six) hours as needed. For pain   Yes Historical Provider, MD  levothyroxine (SYNTHROID, LEVOTHROID) 100 MCG tablet Take 100 mcg by mouth daily.   Yes Historical Provider, MD  Multiple Vitamin (MULITIVITAMIN WITH MINERALS) TABS Take 1 tablet by mouth daily.   Yes Historical Provider, MD   Current Facility-Administered Medications  Medication Dose Route Frequency Provider Last Rate Last Dose  . 0.9 %  sodium chloride infusion   Intravenous Continuous Atilano Ina, MD,FACS 50 mL/hr at 12/26/11 1110    . calcium acetate (PHOSLO) capsule 1,334 mg  1,334 mg Oral TID WC Atilano Ina, MD,FACS      . cefOXitin (MEFOXIN) 2 g in dextrose 5 % 50 mL IVPB  2 g Intravenous 60 min Pre-Op Atilano Ina, MD,FACS   2 g  at 12/26/11 0755  . HYDROcodone-acetaminophen (NORCO/VICODIN) 5-325 MG per tablet 1-2 tablet  1-2 tablet Oral Q4H PRN Atilano Ina, MD,FACS      . levothyroxine (SYNTHROID, LEVOTHROID) tablet 100 mcg  100 mcg Oral QAC breakfast Atilano Ina, MD,FACS      . morphine 2 MG/ML injection 1-2 mg  1-2 mg Intravenous Q1H PRN Atilano Ina, MD,FACS      . mupirocin ointment (BACTROBAN) 2 %           . ondansetron (ZOFRAN) tablet 4 mg  4 mg Oral Q6H PRN Atilano Ina, MD,FACS       Or  . ondansetron [facility]) injection 4 mg  4 mg Intravenous Q6H PRN Atilano Ina, MD,FACS        Labs: Basic Metabolic Panel:  Lab 12/26/11 2440  NA 138  K 3.8  CL 96    CO2 30  GLUCOSE 84  BUN 16  CREATININE 5.69*  CALCIUM 9.6  ALB --  PHOS --   Liver Function Tests:  Lab 12/26/11 0652  AST 46*  ALT 18  ALKPHOS 92  BILITOT 0.6  PROT 7.7  ALBUMIN 3.2*  CBC:  Lab 12/26/11 0652  WBC 2.1*  NEUTROABS 0.8*  HGB 11.3*  HCT 33.8*  MCV 97.1  PLT 66*  CBG:  Lab 12/26/11 1202 12/26/11 0928 12/26/11 0621  GLUCAP 100* 121* 65*   ROS: As per HPI otherwise negative.  Physical Exam: Filed Vitals:   12/26/11 1112 12/26/11 1138 12/26/11 1145 12/26/11 1414  BP: 127/60 104/59  104/55  Pulse: 85 86  91  Temp: 97.5 F (36.4 C) 97.5 F (36.4 C)  97.5 F (36.4 C)  TempSrc:  Oral  Oral  Resp: 15 16  18   Height:   5\' 2"  (1.575 m)   Weight:  75.569 kg (166 lb 9.6 oz)    SpO2: 98% 94%  100%     General: Well developed, well nourished, obese (formerly very obese)  in no acute distress looks somewhat older than age Head: Normocephalic, atraumatic, sclera non-icteric, inner lips quite red (usual for her); voice chronically hoarse/raspy - a little more so at present Neck: Supple. JVD not elevated. Lungs: Clear bilaterally to auscultation without wheezes, rales, or rhonchi. Breathing is unlabored. Heart: RRR with S1 S2. No murmurs, rubs, or gallops appreciated. Abdomen: Soft, + BS incision sites intact Lower extremities: thick without overt edema or ischemic changes, no open wounds; bilateral hallux valgus  Neuro:  Somewhat drowsy. Moves all extremities spontaneously. Psych:  Responds to questions  with a normal affect. Dialysis Access:left upper AVGG + bruit  Dialysis Orders: Center: [facility]  on MWF . EDW 72  HD Bath 2K 2.25 Ca  Time 3.75 Heparin 2000. Access left upper AVGG BFR 400 DFR A 1.5 Zemplar 5 mcg IV/HD Epogen none - on hold since 7/29; last outpt Hgb 11.4 9/04  Units IV/HD  Venofer none - tsat 40%  Other profile 2 graft reversed (160 Optiflux  Assessment/Plan: 1.  S/p lap chole - per CCS 2.  ESRD -  continue outpt HD orders; no heparin HD  due to post op status - will draw am labs pre HD 3.  Hypertension/volume  - no BP meds, volume control only 4.  Anemia  -Hgb - stable - ESA on hold; recheck in am 5.  Metabolic bone disease - continue binders and zemplar 6.  Nutrition -CL - advance as tolerated 7. Chronic leukopenia and thrombocytopenia - lower today  than usual. WBC 8/21 was 2.7 and platelets were 103K; recheck CBC in am. 8. Hypothyroidism - continue synthroid 9.  Disp - anticipate d./c after HD in am if stable  Sheffield Slider, PA-C [facility] Beeper [phone] 12/26/2011, 2:27 PM   Patient seen and examined and agree with assessment and plan as above.  Vinson Moselle  MD [facility] [phone] pgr    [phone] cell 12/26/2011, 5:41 PM

## 2011-12-26 NOTE — Progress Notes (Signed)
Dr Andrey Campanile notifed re. cbg of 39.

## 2011-12-26 NOTE — Op Note (Signed)
Laparoscopic Cholecystectomy with IOC Procedure Note  Indications: This patient presents with symptomatic gallbladder disease and will undergo laparoscopic cholecystectomy.  Pre-operative Diagnosis: Calculus of gallbladder without mention of cholecystitis or obstruction  Post-operative Diagnosis: Same; cirrhosis  Surgeon: Atilano Ina   Assistants: Karie Soda  Anesthesia: General endotracheal anesthesia  ASA Class: 3  Procedure Details  The patient was seen again in the Holding Room. The risks, benefits, complications, treatment options, and expected outcomes were discussed with the patient. The possibilities of reaction to medication, pulmonary aspiration, perforation of viscus, increased risk of bleeding, recurrent infection, finding a normal gallbladder, the need for additional procedures, failure to diagnose a condition, the possible need to convert to an open procedure, and creating a complication requiring transfusion or operation were discussed with the patient. The likelihood of improving the patient's symptoms with return to their baseline status is good.  The patient and/or family concurred with the proposed plan, giving informed consent. The site of surgery properly noted. The patient was taken to Operating Room, identified as Pervis Hocking and the procedure verified as Laparoscopic Cholecystectomy with Intraoperative Cholangiogram. A Time Out was held and the above information confirmed.  Prior to the induction of general anesthesia, antibiotic prophylaxis was administered. General endotracheal anesthesia was then administered and tolerated well. After the induction, the abdomen was prepped with Chloraprep and draped in the sterile fashion. The patient was positioned in the supine position.  Local anesthetic agent was injected into the skin near the umbilicus and an incision made. We dissected down to the abdominal fascia with blunt dissection.  The fascia was incised  vertically and we entered the peritoneal cavity bluntly.  A pursestring suture of 0-Vicryl was placed around the fascial opening.  The Hasson cannula was inserted and secured with the stay suture.  Pneumoperitoneum was then created with CO2 and tolerated well without any adverse changes in the patient's vital signs. The liver was very nodular and cirrhotic. There were omental adhesions in the RUQ to the abdominal wall. An 5-mm port was placed in the subxiphoid position.  One 5-mm port were placed in the lower right upper quadrant. The omental adhesions were taken down with endoshears with cautery with the assistant placing traction on the adhesions. There was bleeding from these adhesions. Once the adhesions were cleared, the last 5mm trocar was placed in the right upper quadrant.  All skin incisions were infiltrated with a local anesthetic agent before making the incision and placing the trocars.   We positioned the patient in reverse Trendelenburg, tilted slightly to the patient's left.  The gallbladder was identified, the fundus grasped and retracted cephalad. Due to the size of the liver, retraction was a little challenging.  Adhesions were lysed bluntly and with the electrocautery where indicated, taking care not to injure any adjacent organs or viscus. The infundibulum was grasped and retracted laterally, exposing the peritoneum overlying the triangle of Calot. This was then divided and exposed in a blunt fashion. A critical view of the cystic duct and cystic artery was obtained.  The cystic duct was clearly identified and bluntly dissected circumferentially. The cystic duct was ligated with a clip distally.    The cystic duct was then ligated with clips and divided. The cystic artery was identified, dissected free, ligated with clips and divided as well.   The gallbladder was dissected from the liver bed in retrograde fashion with the electrocautery. The gallbladder was removed and placed in an Endocatch  sac.  The gallbladder and  Endocatch sac were then removed through the umbilical port site. The liver bed was irrigated and inspected. Hemostasis was achieved with the electrocautery (up to 100). Copious irrigation was utilized and was repeatedly aspirated until clear.  The omentum adhesions that had been lysed were inspected and no evidence of bleeding. 2 pieces of Ethicon surgical SNoW were placed in the liver bed. The pursestring suture was used to close the umbilical fascia.    We again inspected the right upper quadrant for hemostasis.  The umbilical closure was inspected and there was no air leak and nothing trapped within the closure. Pneumoperitoneum was released as we removed the trocars.  4-0 Monocryl was used to close the skin.  Dermabond was applied. The patient was then extubated and brought to the recovery room in stable condition. Instrument, sponge, and needle counts were correct at closure and at the conclusion of the case.   Findings: Chronic Cholecystitis with Cholelithiasis; +critical view; extremely nodular heavy cirrhotic liver. 2 pieces of SNoW left in GB fossa  Estimated Blood Loss: 200 mL         Drains: none         Specimens: Gallbladder           Complications: None; patient tolerated the procedure well.         Disposition: PACU - hemodynamically stable.         Condition: stable Mary Sella. Andrey Campanile, MD, FACS General, Bariatric, & Minimally Invasive Surgery [facility], [place]

## 2011-12-27 ENCOUNTER — Ambulatory Visit (HOSPITAL_COMMUNITY): Payer: Medicare Other

## 2011-12-27 LAB — CBC
HCT: 30.8 % — ABNORMAL LOW (ref 36.0–46.0)
Hemoglobin: 10.3 g/dL — ABNORMAL LOW (ref 12.0–15.0)
MCH: 32.3 pg (ref 26.0–34.0)
MCHC: 33.4 g/dL (ref 30.0–36.0)
MCV: 96.6 fL (ref 78.0–100.0)
Platelets: 61 10*3/uL — ABNORMAL LOW (ref 150–400)
RBC: 3.19 MIL/uL — ABNORMAL LOW (ref 3.87–5.11)
RDW: 13.7 % (ref 11.5–15.5)
WBC: 3.2 10*3/uL — ABNORMAL LOW (ref 4.0–10.5)

## 2011-12-27 LAB — RENAL FUNCTION PANEL
Albumin: 2.6 g/dL — ABNORMAL LOW (ref 3.5–5.2)
BUN: 27 mg/dL — ABNORMAL HIGH (ref 6–23)
CO2: 25 mEq/L (ref 19–32)
Calcium: 9.1 mg/dL (ref 8.4–10.5)
Chloride: 96 mEq/L (ref 96–112)
Creatinine, Ser: 7.43 mg/dL — ABNORMAL HIGH (ref 0.50–1.10)
GFR calc Af Amer: 6 mL/min — ABNORMAL LOW (ref >90)
GFR calc non Af Amer: 5 mL/min — ABNORMAL LOW (ref >90)
Glucose, Bld: 88 mg/dL (ref 70–99)
Phosphorus: 4 mg/dL (ref 2.3–4.6)
Potassium: 4 mEq/L (ref 3.5–5.1)
Sodium: 134 mEq/L — ABNORMAL LOW (ref 135–145)

## 2011-12-27 MED ORDER — PARICALCITOL 5 MCG/ML IV SOLN
INTRAVENOUS | Status: AC
  Administered 2011-12-27: 5 ug via INTRAVENOUS
  Filled 2011-12-27: qty 1

## 2011-12-27 MED ORDER — OXYCODONE-ACETAMINOPHEN 5-325 MG PO TABS: 1.0000 | ORAL_TABLET | ORAL | Status: AC | PRN

## 2011-12-27 MED FILL — Mupirocin Oint 2%: CUTANEOUS | Qty: 22 | Status: AC

## 2011-12-27 NOTE — Progress Notes (Signed)
1 Day Post-Op  Subjective: Pt seen in HD earlier this am. C/o "sore" abdomen. Says she drank some liquids. Denies nausea. Some transient mild hypoTN overnight  Objective: Vital signs in last 24 hours: Temp:  [97.5 F (36.4 C)-98.8 F (37.1 C)] 98.2 F (36.8 C) (09/11 1125) Pulse Rate:  [74-101] 101  (09/11 1125) Resp:  [14-18] 16  (09/11 1125) BP: (77-108)/(46-61) 90/58 mmHg (09/11 1125) SpO2:  [90 %-100 %] 98 % (09/11 1125) Weight:  [160 lb 7.9 oz (72.8 kg)-173 lb 12.8 oz (78.835 kg)] 160 lb 7.9 oz (72.8 kg) (09/11 1044) Last BM Date: 12/25/11  Intake/Output from previous day: 09/10 0701 - 09/11 0700 In: 1323.3 [P.O.:560; I.V.:763.3] Out: 175 [Blood:175] Intake/Output this shift: Total I/O In: -  Out: 2050 [Other:2050]  In HD Alert, nad cta ant Reg Soft, nd, expected mild TTP. Incision c/d/i No edema  Lab Results:   [facility] 12/27/11 0648 12/26/11 0652  WBC 3.2* 2.1*  HGB 10.3* 11.3*  HCT 30.8* 33.8*  PLT 61* 66*   BMET  Basename 12/27/11 0648 12/26/11 0652  NA 134* 138  K 4.0 3.8  CL 96 96  CO2 25 30  GLUCOSE 88 84  BUN 27* 16  CREATININE 7.43* 5.69*  CALCIUM 9.1 9.6   PT/INR  Basename 12/26/11 0652  LABPROT 16.8*  INR 1.34   ABG No results found for this basename: PHART:2,PCO2:2,PO2:2,HCO3:2 in the last 72 hours  Studies/Results: No results found.  Anti-infectives: Anti-infectives     Start     Dose/Rate Route Frequency Ordered Stop   12/26/11 0630   cefOXitin (MEFOXIN) 2 g in dextrose 5 % 50 mL IVPB  Status:  Discontinued        2 g 100 mL/hr over 30 Minutes Intravenous 60 min pre-op 12/26/11 0630 12/26/11 0636   12/25/11 1425   cefOXitin (MEFOXIN) 2 g in dextrose 5 % 50 mL IVPB        2 g 100 mL/hr over 30 Minutes Intravenous 60 min pre-op 12/25/11 1425 12/26/11 0755          Assessment/Plan: s/p Procedure(s) (LRB) with comments: LAPAROSCOPIC CHOLECYSTECTOMY (N/A)  Doing ok. hgb down 1. Not unexpected given blood loss in OR.    Will re-evaluate pt later today to see if ok for discharge  Just spoke with nurse. Pt back from HD. Pt tolerated lunch but weak. Will keep additional day. PT consult. OOB with assistance. Recheck cbc in am  Mary Sella. Andrey Campanile, MD, FACS General, Bariatric, & Minimally Invasive Surgery [facility], [place]   LOS: 1 day    Atilano Ina 12/27/2011

## 2011-12-27 NOTE — Progress Notes (Signed)
  [name] Progress Note  Subjective:  Drank a little liquid.  Belly sore  Objective Filed Vitals:   12/27/11 0648 12/27/11 0700 12/27/11 0730 12/27/11 0800  BP: 100/56 108/58 102/57 105/56  Pulse: 74 78 82 86  Temp:   97.7 F (36.5 C)   TempSrc:   Oral   Resp:  15 16 16   Height:      Weight:      SpO2:   96%    Physical Exam on HD goal 2.8 General:NAD Heart: RRR Lungs: no wheezes or rales Abdomen: + BS Extremities:SCDs in place Dialysis Access: left upper AVGG Qb400  Dialysis Orders: Center: [facility] on MWF .  EDW 72 HD Bath 2K 2.25 Ca Time 3.75 Heparin 2000. Access left upper AVGG BFR 400 DFR A 1.5  Zemplar 5 mcg IV/HD Epogen none - on hold since 7/29; last outpt Hgb 11.4 9/04 Units IV/HD Venofer none - tsat 40%  Other profile 2 graft reversed (160 Optiflux   Assessment/Plan:  1. S/p lap chole - per CCS - no heparin today 2. ESRD - continue outpt HD orders; 3. Hypertension/volume - no BP meds, volume control only 4. Anemia -Hgb - stable - ESA on hold; recheck in am 5. Metabolic bone disease - continue binders and zemplar 6. Nutrition -CL - advance as tolerated 7. Chronic leukopenia and thrombocytopenia - lower today than usual. WBC higher today at 3.2; and platelets down slightly to 61; these will be followed at her HD center 8. Cirrhosis, portal gastropathy- stable, no evid GIB 9. Hypothyroidism - continue synthroid 10. Disp - anticipate d./c after HD  Sheffield Slider, PA-C [facility] Beeper [phone]  12/27/2011,8:24 AM  LOS: 1 day   Patient seen and examined and agree with assessment and plan as above.  Vinson Moselle  MD [facility] [phone] pgr    [phone] cell 12/27/2011, 11:05 AM   Additional Objective Labs: Basic Metabolic Panel:  Lab 12/27/11 4132 12/26/11 0652  NA 134* 138  K 4.0 3.8  CL 96 96  CO2 25 30  GLUCOSE 88 84  BUN 27* 16  CREATININE 7.43* 5.69*  CALCIUM 9.1 9.6  ALB -- --  PHOS 4.0 --     Liver Function Tests:  Lab 12/27/11 0648 12/26/11 0652  AST -- 46*  ALT -- 18  ALKPHOS -- 92  BILITOT -- 0.6  PROT -- 7.7  ALBUMIN 2.6* 3.2*   CBC:  Lab 12/27/11 0648 12/26/11 0652  WBC 3.2* 2.1*  NEUTROABS -- 0.8*  HGB 10.3* 11.3*  HCT 30.8* 33.8*  MCV 96.6 97.1  PLT 61* 66*  CBG:  Lab 12/26/11 1718 12/26/11 1202 12/26/11 0928 12/26/11 0621  GLUCAP 83 100* 121* 65*  Medications:    . sodium chloride 50 mL/hr at 12/26/11 2305      . calcium acetate  1,334 mg Oral TID WC  . levothyroxine  100 mcg Oral QAC breakfast  . mupirocin ointment      . paricalcitol      . paricalcitol  5 mcg Intravenous Q M,W,F-HD  . DISCONTD: chlorhexidine  1 application Topical Once  . DISCONTD: chlorhexidine  1 application Topical Once

## 2011-12-27 NOTE — Procedures (Signed)
Patient seen and examined and agree with assessment and plan as above.  Samantha Moselle  MD [facility] [phone] pgr    [phone] cell 12/27/2011, 11:06 AM

## 2011-12-28 ENCOUNTER — Encounter (HOSPITAL_COMMUNITY): Payer: Self-pay | Admitting: General Surgery

## 2011-12-28 LAB — GLUCOSE, CAPILLARY: Glucose-Capillary: 100 mg/dL — ABNORMAL HIGH (ref 70–99)

## 2011-12-28 LAB — CBC
HCT: 28.1 % — ABNORMAL LOW (ref 36.0–46.0)
Hemoglobin: 9.5 g/dL — ABNORMAL LOW (ref 12.0–15.0)
WBC: 3.5 10*3/uL — ABNORMAL LOW (ref 4.0–10.5)

## 2011-12-28 MED ORDER — INFLUENZA VIRUS VACC SPLIT PF IM SUSP: 0.5000 mL | INTRAMUSCULAR | Status: DC

## 2011-12-28 NOTE — Progress Notes (Signed)
Hypoglycemic Event  CBG: 62  Treatment: 15 GM carbohydrate snack  Symptoms: None  Follow-up CBG: Time:1350   CBG Result:100  Possible Reasons for Event: Unknown  Comments/MD notified:Pt discharged to home    Samantha [name]  Remember to initiate Hypoglycemia Order Set & complete

## 2011-12-28 NOTE — Progress Notes (Signed)
Discharge instructions/Med Rec Sheet reviewed w/ pt. Pt expressed understanding and copies given w/ prescriptions. Pt d/c'd in stable condition via w/c, accompanied by discharge volunteers 

## 2011-12-28 NOTE — Evaluation (Signed)
Physical Therapy Evaluation Patient Details Name: Samantha [name] MRN: [id number] DOB: Feb 25, 1946 Today's Date: 12/28/2011 Time:  -     PT Assessment / Plan / Recommendation Clinical Impression       PT Assessment  Patient needs continued PT services    Follow Up Recommendations  Home health PT;Supervision for mobility/OOB    Barriers to Discharge        Equipment Recommendations  None recommended by PT    Recommendations for Other Services     Frequency Min 3X/week    Precautions / Restrictions Precautions Precautions: Fall Restrictions Weight Bearing Restrictions: No         Mobility  Bed Mobility Bed Mobility: Sit to Supine Sit to Supine: 4: Min guard Details for Bed Mobility Assistance: VC for sequencing. Slow movement Transfers Transfers: Sit to Stand;Stand to Sit Sit to Stand: 4: Min assist;With upper extremity assist;From chair/3-in-1 Stand to Sit: 4: Min assist;With upper extremity assist;To bed Details for Transfer Assistance: VC for hand placement and safety. Min assist for stability Ambulation/Gait Ambulation/Gait Assistance: 4: Min assist Assistive device: 1 person hand held assist Gait Pattern: Step-to pattern;Decreased stride length;Narrow base of support Gait velocity: decreased gait speed    Exercises     PT Diagnosis: Difficulty walking;Acute pain  PT Problem List: Decreased strength;Decreased activity tolerance;Decreased mobility;Decreased knowledge of use of DME;Pain PT Treatment Interventions: DME instruction;Gait training;Functional mobility training;Therapeutic activities;Patient/family education   PT Goals Acute Rehab PT Goals PT Goal Formulation: With patient Time For Goal Achievement: 01/04/12 Potential to Achieve Goals: Fair Pt will go Sit to Stand: with supervision PT Goal: Sit to Stand - Progress: Goal set today Pt will go Stand to Sit: with supervision PT Goal: Stand to Sit - Progress: Goal set today Pt will Transfer Bed to  Chair/Chair to Bed: with supervision PT Transfer Goal: Bed to Chair/Chair to Bed - Progress: Goal set today Pt will Ambulate: 51 - 150 feet;with supervision;with least restrictive assistive device PT Goal: Ambulate - Progress: Goal set today  Visit Information  Assistance Needed: +1    Subjective Data      Prior Functioning  Home Living Lives With: Daughter Available Help at Discharge: Family;Available PRN/intermittently Type of Home: House Home Access: Level entry Home Layout: Two level;Able to live on main level with bedroom/bathroom Bathroom Shower/Tub: Tub/shower unit;Curtain Bathroom Toilet: Standard Bathroom Accessibility: Yes How Accessible: Accessible via walker Home Adaptive Equipment: Other (comment) (walking stick) Prior Function Level of Independence: Needs assistance Needs Assistance: Meal Prep;Light Housekeeping Meal Prep: Total Light Housekeeping: Total Able to Take Stairs?: No Driving: No Vocation: Retired Musician: No difficulties;HOH Dominant Hand: Right    Cognition  Overall Cognitive Status: Impaired (no family to assess if at baseline level) Arousal/Alertness: Awake/alert Orientation Level: Disoriented to;Situation;Time Cognition - Other Comments: slow to process information.     Extremity/Trunk Assessment Right Lower Extremity Assessment RLE ROM/Strength/Tone: Deficits RLE ROM/Strength/Tone Deficits: Grossly 4/5 RLE Sensation: WFL - Light Touch Left Lower Extremity Assessment LLE ROM/Strength/Tone: Deficits LLE ROM/Strength/Tone Deficits: Grossly 4/5 LLE Sensation: WFL - Light Touch   Balance    End of Session PT - End of Session Equipment Utilized During Treatment: Gait belt Activity Tolerance: Patient limited by fatigue Patient left: in bed;with call bell/phone within reach;with bed alarm set Nurse Communication: Mobility status    Milana Kidney 12/28/2011, 3:54 PM  12/28/2011 Milana Kidney DPT PAGER:  [phone] OFFICE: [phone]

## 2011-12-28 NOTE — Progress Notes (Signed)
Hypoglycemic Event  CBG: 62  Treatment: 15 GM carbohydrate snack  Symptoms: None  Follow-up CBG: Time:1320 CBG Result:62  Possible Reasons for Event: Unknown  Comments/MD notified: See next note    Samantha [name]  Remember to initiate Hypoglycemia Order Set & complete

## 2011-12-28 NOTE — Care Management Note (Signed)
  Page 1 of 1   12/28/2011     1:35:15 PM   CARE MANAGEMENT NOTE 12/28/2011  Patient:  Samantha [name], Samantha [name]   Account Number:  [id number]  Date Initiated:  12/28/2011  Documentation initiated by:  Ronny Flurry  Subjective/Objective Assessment:     Action/Plan:   Anticipated DC Date:  12/28/2011   Anticipated DC Plan:  HOME W HOME HEALTH SERVICES         Choice offered to / List presented to:  C-1 Patient        HH arranged  HH-2 PT      HH agency  [facility]   Status of service:  Completed, signed off Medicare Important Message given?   (If response is "NO", the following Medicare IM given date fields will be blank) Date Medicare IM given:   Date Additional Medicare IM given:    Discharge Disposition:  HOME W HOME HEALTH SERVICES  Per UR Regulation:  Reviewed for med. necessity/level of care/duration of stay  If discussed at Long Length of Stay Meetings, dates discussed:    Comments:   12-28-11 Spoke with patient and her grandson Onalee Hua at bedside.  Both want [facility] . Referral made .  Ronny Flurry RN BSN [phone]   12-28-11 MD ordered HHPT . Facesheet information confirmed. List for [facility] given to patient. Patient wants to wait until her family comes to pick her up to chose [facility] agency .  Patient lives with daughter and grandson.   Ronny Flurry RN BSN [phone]

## 2011-12-28 NOTE — Progress Notes (Signed)
  [facility] Progress Note  Subjective:  No c/o except belly a little sore  Objective Filed Vitals:   12/27/11 1125 12/27/11 1538 12/27/11 2124 12/28/11 0500  BP: 90/58 92/60 81/44  81/45  Pulse: 101  93 100  Temp: 98.2 F (36.8 C)  99.5 F (37.5 C) 98.1 F (36.7 C)  TempSrc:    Oral  Resp: 16  16 16   Height:      Weight:      SpO2: 98%  100% 100%   Physical Exam General: alert and talkative Heart: RRR Lungs:  CTA Abdomen: osft + BS sore RUQ Extremities: no significant edema Dialysis Access: left AVGG  Dialysis Orders: Center: [facility] on MWF .  EDW 72 HD Bath 2K 2.25 Ca Time 3.75 Heparin 2000. Access left upper AVGG BFR 400 DFR A 1.5  Zemplar 5 mcg IV/HD Epogen none - on hold since 7/29; last outpt Hgb 11.4 9/04 Units IV/HD Venofer none - tsat 40% Other profile 2 graft reversed (160 Optiflux   Assessment/Plan: 1. S/p lap chole - per [facility]CS - no heparin Friday outpt HD: then tight next week 2. ESRD - continue outpt HD orders; no change EDW 3. Hypertension/volume - no BP meds, volume control only; BP is lower than usual 4. Anemia -Hgb - dropping. Now 9.5  - ESA  Had been on hold PTA- resume Epo Friday per protocol and recheck Hgb 5. Metabolic bone disease - continue binders and zemplar 6. Nutrition -on renal diet 7. Chronic leukopenia and thrombocytopenia -WBC stable, but platelets continue to drop- recheck next week 8. Cirrhosis, portal gastropathy- stable, no evid GIB 9. Hypothyroidism - continue synthroid 10. Disp - d/c today per [facility]CS  Samantha Slider, Samantha [name] [facility] Beeper [phone]  12/28/2011,10:16 AM  LOS: 2 days   Patient seen and examined and agree with assessment and plan as above.  Samantha Moselle  MD [facility] [phone] pgr    [phone] cell 12/28/2011, 11:13 AM   Additional Objective Labs: Basic Metabolic Panel:  Lab 12/27/11 2130 12/26/11 0652  NA 134* 138  K 4.0 3.8  CL 96 96  CO2 25 30  GLUCOSE 88 84    BUN 27* 16  CREATININE 7.43* 5.69*  CALCIUM 9.1 9.6  ALB -- --  PHOS 4.0 --   Liver Function Tests:  Lab 12/27/11 0648 12/26/11 0652  AST -- 46*  ALT -- 18  ALKPHOS -- 92  BILITOT -- 0.6  PROT -- 7.7  ALBUMIN 2.6* 3.2*   No results found for this basename: LIPASE:3,AMYLASE:3 in the last 168 hours CBC:  Lab 12/28/11 0515 12/27/11 0648 12/26/11 0652  WBC 3.5* 3.2* 2.1*  NEUTROABS -- -- 0.8*  HGB 9.5* 10.3* 11.3*  HCT 28.1* 30.8* 33.8*  MCV 96.6 96.6 97.1  PLT 53* 61* 66*  CBG:  Lab 12/26/11 1718 12/26/11 1202 12/26/11 0928 12/26/11 0621  GLUCAP 83 100* 121* 65*  Medications:    . sodium chloride 10 mL/hr at 12/27/11 1129      . calcium acetate  1,334 mg Oral TID WC  . levothyroxine  100 mcg Oral QAC breakfast  . paricalcitol  5 mcg Intravenous Q M,W,F-HD

## 2011-12-28 NOTE — Discharge Summary (Signed)
Physician Discharge Summary  Patient ID: Samantha [name] MRN: [id number] DOB/AGE: February 11, 1946 66 y.o.  Admit date: 12/26/2011 Discharge date: 12/28/2011  Admission Diagnoses: Patient Active Problem List  Diagnosis  . ESRD on hemodialysis  . HTN (hypertension)  . RUQ abdominal tenderness  . Anemia in chronic kidney disease (CKD)  . Acute blood loss anemia  . Transaminitis  . Hepatitis B antibody positive  . Symptomatic cholelithiasis    Discharge Diagnoses:  ESRD on hemodialysis  HTN (hypertension)  RUQ abdominal tenderness  Anemia in chronic kidney disease (CKD)  Acute blood loss anemia  Transaminitis  Hepatitis B antibody positive  Symptomatic cholelithiasis    Discharged Condition: fair  Hospital Course: 66 yo AAF admitted for planned laparoscopic cholecystectomy for epigastric pain and gallstones. Please see operative note- Pt had a cirrhotic liver with some blood loss during surgery. Postoperatively, renal saw her and patient received hemodialysis on Wednesday. She had not ambulated much by POD 1 pm so I elected to keep her another evening. On POD 2, she had been evaluated by PT who recommended HHPT. She ate 100% of her breakfast. Her pain was controlled with oral pain meds. Her hemoglobin had drifted down slightly since surgery. Her systolic blood pressure has been consistently in 80-90s but she appears to be asymptomatic. She did not receive heparin in her hemodialysis session.  I feel she is stable for discharge.   Consults: nephrology  Significant Diagnostic Studies: labs: hgb 11.3 to 9.5 on day of discharge  Treatments: IV hydration, analgesia: Morphine and percocet, therapies: PT, dialysis: Hemodialysis and surgery: LAPAROSCOPIC CHOLECYSTECTOMY  Discharge Exam: Blood pressure 81/45, pulse 100, temperature 98.1 F (36.7 C), temperature source Oral, resp. rate 16, height 5\' 2"  (1.575 m), weight 160 lb 7.9 oz (72.8 kg), SpO2 100.00%. Alert, NAD CTA ant  b/l Regular Soft, nontender, incision c/d/i  Disposition: 01-Home or Self Care  Discharge Orders    Future Appointments: Provider: Department: Dept Phone: Center:   01/10/2012 10:15 AM Atilano Ina, MD,FACS [facility] [phone] None     Future Orders Please Complete By Expires   Increase activity slowly          Medication List     As of 12/28/2011 10:35 AM    STOP taking these medications         HYDROcodone-acetaminophen 5-325 MG per tablet   Commonly known as: NORCO/VICODIN      TAKE these medications         calcium acetate 667 MG capsule   Commonly known as: PHOSLO   Take 1,334 mg by mouth 3 (three) times daily with meals.      levothyroxine 100 MCG tablet   Commonly known as: SYNTHROID, LEVOTHROID   Take 100 mcg by mouth daily.      multivitamin with minerals Tabs   Take 1 tablet by mouth daily.      oxyCODONE-acetaminophen 5-325 MG per tablet   Commonly known as: PERCOCET/ROXICET   Take 1 tablet by mouth every 4 (four) hours as needed for pain.           Follow-up Information    Follow up with Atilano Ina, MD,FACS. On 01/10/2012. (10:15 AM)    Contact information:   [address] [place] [address] [phone]       Schedule an appointment as soon as possible for a visit with Dorrene German, MD.   Contact information:   [address] [place] [address] [phone]  Follow up with [facility]. (for dialysis)    Contact information:   [address] [place] [address] [phone]           Signed: Atilano Ina 12/28/2011, 10:35 AM

## 2012-01-10 ENCOUNTER — Encounter (INDEPENDENT_AMBULATORY_CARE_PROVIDER_SITE_OTHER): Payer: Medicare Other | Admitting: General Surgery

## 2012-01-16 ENCOUNTER — Encounter (INDEPENDENT_AMBULATORY_CARE_PROVIDER_SITE_OTHER): Payer: Self-pay | Admitting: General Surgery

## 2012-01-22 ENCOUNTER — Telehealth (INDEPENDENT_AMBULATORY_CARE_PROVIDER_SITE_OTHER): Payer: Self-pay | Admitting: General Surgery

## 2012-01-22 NOTE — Telephone Encounter (Signed)
Samantha [name] with [facility] calling to see if we will give an order for a transfer bench. Patient has weakness and trouble with showering. She had lap chole on 12/26/11. They have tried to contact Dr Concepcion Elk for an order but they have not heard anything. Please advise if okay to order.

## 2012-01-22 NOTE — Telephone Encounter (Signed)
Ok to order 

## 2012-01-23 NOTE — Telephone Encounter (Signed)
Megan with [facility] made aware.

## 2012-01-25 ENCOUNTER — Other Ambulatory Visit (HOSPITAL_COMMUNITY): Payer: Self-pay | Admitting: Nephrology

## 2012-01-25 DIAGNOSIS — N186 End stage renal disease: Secondary | ICD-10-CM

## 2012-02-01 ENCOUNTER — Ambulatory Visit (HOSPITAL_COMMUNITY): Admission: RE | Admit: 2012-02-01 | Payer: Medicare Other | Source: Ambulatory Visit

## 2012-02-02 ENCOUNTER — Telehealth (HOSPITAL_COMMUNITY): Payer: Self-pay | Admitting: *Deleted

## 2012-02-08 ENCOUNTER — Other Ambulatory Visit (HOSPITAL_COMMUNITY): Payer: Self-pay | Admitting: Nephrology

## 2012-02-08 DIAGNOSIS — N186 End stage renal disease: Secondary | ICD-10-CM

## 2012-02-15 ENCOUNTER — Ambulatory Visit (HOSPITAL_COMMUNITY): Admission: RE | Admit: 2012-02-15 | Payer: Medicare Other | Source: Ambulatory Visit

## 2012-02-29 ENCOUNTER — Telehealth (HOSPITAL_COMMUNITY): Payer: Self-pay | Admitting: Nephrology

## 2012-02-29 ENCOUNTER — Ambulatory Visit (HOSPITAL_COMMUNITY)
Admission: RE | Admit: 2012-02-29 | Discharge: 2012-02-29 | Disposition: A | Discharge status: Home / Self Care | Payer: Medicare Other | Source: Ambulatory Visit | Attending: Nephrology | Admitting: Nephrology

## 2012-02-29 ENCOUNTER — Other Ambulatory Visit (HOSPITAL_COMMUNITY): Payer: Self-pay | Admitting: Nephrology

## 2012-02-29 DIAGNOSIS — E039 Hypothyroidism, unspecified: Secondary | ICD-10-CM | POA: Insufficient documentation

## 2012-02-29 DIAGNOSIS — E119 Type 2 diabetes mellitus without complications: Secondary | ICD-10-CM | POA: Insufficient documentation

## 2012-02-29 DIAGNOSIS — I120 Hypertensive chronic kidney disease with stage 5 chronic kidney disease or end stage renal disease: Secondary | ICD-10-CM | POA: Insufficient documentation

## 2012-02-29 DIAGNOSIS — N186 End stage renal disease: Secondary | ICD-10-CM

## 2012-02-29 MED ORDER — IOHEXOL 300 MG/ML  SOLN
100.0000 mL | Freq: Once | INTRAMUSCULAR | Status: AC | PRN
  Administered 2012-02-29: 60 mL via INTRAVENOUS

## 2012-02-29 NOTE — H&P (Signed)
Samantha [name] is an 66 y.o. female.   Chief Complaint: Poor graft function HPI: See above   Past Medical History  Diagnosis Date  . Shoulder pain   . Headache     occasionally  . Dizziness   . Occasional numbness/prickling/tingling of fingers and toes   . Arthritis     left shoulder  . Joint pain   . Joint swelling   . Gastric ulcer   . Dry skin   . Peripheral edema   . Constipation   . Oligouria   . H/O: GI bleed   . Hepatitis     Hep B  . History of kidney stones   . History of blood transfusion   . Hypertension   . Anemia   . Hypothyroidism     takes Synthroid daily  . Impaired hearing   . Diabetes mellitus     borderline  . Renal disorder     m, w, F     Past Surgical History  Procedure Date  . Shoulder surgery 3-76yrs ago    right replacement  . Esophagogastroduodenoscopy 06/09/2011    Procedure: ESOPHAGOGASTRODUODENOSCOPY (EGD);  Surgeon: Theda Belfast, MD;  Location: [facility] ENDOSCOPY;  Service: Endoscopy;  Laterality: N/A;  . Cataract surgery     bilateral  . Reverse shoulder arthroplasty 09/22/2011    Procedure: REVERSE SHOULDER ARTHROPLASTY;  Surgeon: Verlee Rossetti, MD;  Location: [facility] OR;  Service: Orthopedics;  Laterality: Left;  left reverse shoulder arthroplasty  . Cholecystectomy 12/26/2011  . Cholecystectomy 12/26/2011    Procedure: LAPAROSCOPIC CHOLECYSTECTOMY;  Surgeon: Atilano Ina, MD,FACS;  Location: [facility] OR;  Service: General;  Laterality: N/A;    No family history on file. Social History:  reports that she has never smoked. Her smokeless tobacco use includes Chew. She reports that she does not drink alcohol or use illicit drugs.  Allergies: No Known Allergies   (Not in a hospital admission)  No results found for this or any previous visit (from the past 48 hour(s)). No results found.  ROS  There were no vitals taken for this visit. Physical Exam  Constitutional: She is oriented to person, place, and time. She appears well-developed and  well-nourished.  HENT:  Head: Normocephalic and atraumatic.  Neck: Normal range of motion.  Cardiovascular: Normal rate and regular rhythm.   Respiratory: Effort normal and breath sounds normal.  GI: Soft.  Neurological: She is alert and oriented to person, place, and time.  Skin: Skin is warm and dry.     Assessment/Plan Venous PTA  Samantha [name],ART A 02/29/2012, 11:34 AM

## 2012-02-29 NOTE — Procedures (Signed)
PTA venous 7 mm No comp 

## 2012-03-01 ENCOUNTER — Telehealth (HOSPITAL_COMMUNITY): Payer: Self-pay | Admitting: *Deleted

## 2012-04-08 ENCOUNTER — Inpatient Hospital Stay (HOSPITAL_COMMUNITY)
Admission: EM | Admit: 2012-04-08 | Discharge: 2012-04-13 | DRG: 193 | Disposition: A | Discharge status: Home / Self Care | Payer: Medicare Other | Attending: Internal Medicine | Admitting: Internal Medicine

## 2012-04-08 ENCOUNTER — Other Ambulatory Visit: Payer: Self-pay

## 2012-04-08 ENCOUNTER — Emergency Department (HOSPITAL_COMMUNITY): Payer: Medicare Other

## 2012-04-08 ENCOUNTER — Encounter (HOSPITAL_COMMUNITY): Payer: Self-pay | Admitting: *Deleted

## 2012-04-08 ENCOUNTER — Inpatient Hospital Stay (HOSPITAL_COMMUNITY): Payer: Medicare Other

## 2012-04-08 DIAGNOSIS — J189 Pneumonia, unspecified organism: Principal | ICD-10-CM | POA: Diagnosis present

## 2012-04-08 DIAGNOSIS — K8020 Calculus of gallbladder without cholecystitis without obstruction: Secondary | ICD-10-CM

## 2012-04-08 DIAGNOSIS — R768 Other specified abnormal immunological findings in serum: Secondary | ICD-10-CM

## 2012-04-08 DIAGNOSIS — K922 Gastrointestinal hemorrhage, unspecified: Secondary | ICD-10-CM

## 2012-04-08 DIAGNOSIS — K7030 Alcoholic cirrhosis of liver without ascites: Secondary | ICD-10-CM | POA: Diagnosis present

## 2012-04-08 DIAGNOSIS — I120 Hypertensive chronic kidney disease with stage 5 chronic kidney disease or end stage renal disease: Secondary | ICD-10-CM | POA: Diagnosis present

## 2012-04-08 DIAGNOSIS — I959 Hypotension, unspecified: Secondary | ICD-10-CM | POA: Diagnosis present

## 2012-04-08 DIAGNOSIS — D62 Acute posthemorrhagic anemia: Secondary | ICD-10-CM

## 2012-04-08 DIAGNOSIS — E871 Hypo-osmolality and hyponatremia: Secondary | ICD-10-CM | POA: Diagnosis present

## 2012-04-08 DIAGNOSIS — R10811 Right upper quadrant abdominal tenderness: Secondary | ICD-10-CM

## 2012-04-08 DIAGNOSIS — I868 Varicose veins of other specified sites: Secondary | ICD-10-CM | POA: Diagnosis present

## 2012-04-08 DIAGNOSIS — Z96619 Presence of unspecified artificial shoulder joint: Secondary | ICD-10-CM

## 2012-04-08 DIAGNOSIS — K859 Acute pancreatitis without necrosis or infection, unspecified: Secondary | ICD-10-CM | POA: Diagnosis present

## 2012-04-08 DIAGNOSIS — R7309 Other abnormal glucose: Secondary | ICD-10-CM | POA: Diagnosis present

## 2012-04-08 DIAGNOSIS — N2581 Secondary hyperparathyroidism of renal origin: Secondary | ICD-10-CM | POA: Diagnosis present

## 2012-04-08 DIAGNOSIS — M25519 Pain in unspecified shoulder: Secondary | ICD-10-CM

## 2012-04-08 DIAGNOSIS — D631 Anemia in chronic kidney disease: Secondary | ICD-10-CM

## 2012-04-08 DIAGNOSIS — I10 Essential (primary) hypertension: Secondary | ICD-10-CM | POA: Diagnosis present

## 2012-04-08 DIAGNOSIS — R109 Unspecified abdominal pain: Secondary | ICD-10-CM | POA: Diagnosis present

## 2012-04-08 DIAGNOSIS — I8500 Esophageal varices without bleeding: Secondary | ICD-10-CM | POA: Diagnosis present

## 2012-04-08 DIAGNOSIS — Z992 Dependence on renal dialysis: Secondary | ICD-10-CM

## 2012-04-08 DIAGNOSIS — E039 Hypothyroidism, unspecified: Secondary | ICD-10-CM | POA: Diagnosis present

## 2012-04-08 DIAGNOSIS — R0602 Shortness of breath: Secondary | ICD-10-CM | POA: Diagnosis present

## 2012-04-08 DIAGNOSIS — M19019 Primary osteoarthritis, unspecified shoulder: Secondary | ICD-10-CM | POA: Diagnosis present

## 2012-04-08 DIAGNOSIS — Z79899 Other long term (current) drug therapy: Secondary | ICD-10-CM

## 2012-04-08 DIAGNOSIS — N186 End stage renal disease: Secondary | ICD-10-CM | POA: Diagnosis present

## 2012-04-08 DIAGNOSIS — D61818 Other pancytopenia: Secondary | ICD-10-CM | POA: Diagnosis not present

## 2012-04-08 LAB — COMPREHENSIVE METABOLIC PANEL
AST: 30 U/L (ref 0–37)
Albumin: 3.3 g/dL — ABNORMAL LOW (ref 3.5–5.2)
Alkaline Phosphatase: 88 U/L (ref 39–117)
BUN: 18 mg/dL (ref 6–23)
CO2: 26 mEq/L (ref 19–32)
Chloride: 85 mEq/L — ABNORMAL LOW (ref 96–112)
Creatinine, Ser: 6.38 mg/dL — ABNORMAL HIGH (ref 0.50–1.10)
GFR calc non Af Amer: 6 mL/min — ABNORMAL LOW (ref >90)
Potassium: 3.5 mEq/L (ref 3.5–5.1)
Total Bilirubin: 0.8 mg/dL (ref 0.3–1.2)

## 2012-04-08 LAB — CBC WITH DIFFERENTIAL/PLATELET
Basophils Relative: 1 % (ref 0–1)
HCT: 38.1 % (ref 36.0–46.0)
Hemoglobin: 12.6 g/dL (ref 12.0–15.0)
Lymphocytes Relative: 43 % (ref 12–46)
Monocytes Absolute: 0.4 10*3/uL (ref 0.1–1.0)
Monocytes Relative: 13 % — ABNORMAL HIGH (ref 3–12)
Neutro Abs: 1.1 10*3/uL — ABNORMAL LOW (ref 1.7–7.7)
Neutrophils Relative %: 38 % — ABNORMAL LOW (ref 43–77)
RBC: 3.88 MIL/uL (ref 3.87–5.11)
WBC: 2.9 10*3/uL — ABNORMAL LOW (ref 4.0–10.5)

## 2012-04-08 LAB — POCT I-STAT TROPONIN I: Troponin i, poc: 0.03 ng/mL (ref 0.00–0.08)

## 2012-04-08 LAB — LIPASE, BLOOD: Lipase: 229 U/L — ABNORMAL HIGH (ref 11–59)

## 2012-04-08 MED ORDER — MORPHINE SULFATE 4 MG/ML IJ SOLN
4.0000 mg | Freq: Once | INTRAMUSCULAR | Status: DC
  Filled 2012-04-08: qty 1

## 2012-04-08 MED ORDER — SODIUM CHLORIDE 0.9 % IV BOLUS (SEPSIS)
500.0000 mL | Freq: Once | INTRAVENOUS | Status: AC
  Administered 2012-04-08: 500 mL via INTRAVENOUS

## 2012-04-08 MED ORDER — ONDANSETRON HCL 4 MG/2ML IJ SOLN
4.0000 mg | Freq: Once | INTRAMUSCULAR | Status: AC
  Administered 2012-04-08: 4 mg via INTRAVENOUS
  Filled 2012-04-08: qty 2

## 2012-04-08 MED ORDER — IOHEXOL 350 MG/ML SOLN
100.0000 mL | Freq: Once | INTRAVENOUS | Status: AC | PRN
Start: 2012-04-08 — End: 2012-04-08
  Administered 2012-04-08: 100 mL via INTRAVENOUS

## 2012-04-08 MED ORDER — LEVOFLOXACIN IN D5W 750 MG/150ML IV SOLN
750.0000 mg | Freq: Once | INTRAVENOUS | Status: DC
  Filled 2012-04-08: qty 150

## 2012-04-08 MED ORDER — SODIUM CHLORIDE 0.9 % IV SOLN
Freq: Once | INTRAVENOUS | Status: AC
  Administered 2012-04-08: 23:00:00 via INTRAVENOUS

## 2012-04-08 MED ORDER — ALBUTEROL SULFATE (5 MG/ML) 0.5% IN NEBU
5.0000 mg | INHALATION_SOLUTION | Freq: Once | RESPIRATORY_TRACT | Status: AC
  Administered 2012-04-08: 5 mg via RESPIRATORY_TRACT
  Filled 2012-04-08: qty 1

## 2012-04-08 MED ORDER — VANCOMYCIN HCL IN DEXTROSE 1-5 GM/200ML-% IV SOLN: 1000.0000 mg | Freq: Once | INTRAVENOUS | Status: DC

## 2012-04-08 MED ORDER — DEXTROSE 5 % IV SOLN: 2.0000 g | Freq: Once | INTRAVENOUS | Status: DC

## 2012-04-08 MED ORDER — FENTANYL CITRATE 0.05 MG/ML IJ SOLN
50.0000 ug | Freq: Once | INTRAMUSCULAR | Status: AC
  Administered 2012-04-08: 50 ug via INTRAVENOUS
  Filled 2012-04-08: qty 2

## 2012-04-08 NOTE — Progress Notes (Signed)
ANTIBIOTIC CONSULT NOTE - INITIAL  Pharmacy Consult for vancomycin, cefepime, levofloxacin Indication: rule out pneumonia  No Known Allergies  Patient Measurements:    Vital Signs: Temp: 97.8 F (36.6 C) (12/23 1617) Temp src: Oral (12/23 1617) BP: 94/55 mmHg (12/23 2145) Pulse Rate: 90  (12/23 2145) Intake/Output from previous day:   Intake/Output from this shift:    Labs:  [facility] 04/08/12 1722  WBC 2.9*  HGB 12.6  PLT 87*  LABCREA --  CREATININE 6.38*   The CrCl is unknown because both a height and weight (above a minimum accepted value) are required for this calculation. No results found for this basename: VANCOTROUGH:2,VANCOPEAK:2,VANCORANDOM:2,GENTTROUGH:2,GENTPEAK:2,GENTRANDOM:2,TOBRATROUGH:2,TOBRAPEAK:2,TOBRARND:2,AMIKACINPEAK:2,AMIKACINTROU:2,AMIKACIN:2, in the last 72 hours   Microbiology: No results found for this or any previous visit (from the past 720 hour(s)).  Medical History: Past Medical History  Diagnosis Date  . Shoulder pain   . Headache     occasionally  . Dizziness   . Occasional numbness/prickling/tingling of fingers and toes   . Arthritis     left shoulder  . Joint pain   . Joint swelling   . Gastric ulcer   . Dry skin   . Peripheral edema   . Constipation   . Oligouria   . H/O: GI bleed   . Hepatitis     Hep B  . History of kidney stones   . History of blood transfusion   . Hypertension   . Anemia   . Hypothyroidism     takes Synthroid daily  . Impaired hearing   . Diabetes mellitus     borderline  . Renal disorder     m, w, F     Medications:  Scheduled:    . [COMPLETED] albuterol  5 mg Nebulization Once  . [COMPLETED] fentaNYL  50 mcg Intravenous Once  . [COMPLETED] ondansetron  4 mg Intravenous Once  . [DISCONTINUED] morphine  4 mg Intravenous Once   Assessment: 66 yo female with ESRD-HD MWF who presented with shortness of breath. Pharmacy consulted to manage cefepime, vancomycin, and levofloxacin for possible  pneumonia.   Goal of Therapy:  Pre-HD vancomycin level 15-25 mcg/mL   Plan:  1. Levofloxacin 750mg  IV x 1, then 500mg  IV Q48H. 2. Cefepime 2gm IV x 1, then 2gm IV Q-HD 3. Vancomycin 1750mg  IV x 1 (total loading dose), then 750mg  IV Q-HD 4. Follow-up HD schedule and adjust antibiotics as needed.  Emeline Gins 04/08/2012,11:27 PM

## 2012-04-08 NOTE — ED Notes (Signed)
Pt has pain to mid upper quad

## 2012-04-08 NOTE — ED Notes (Signed)
Pt brought in with shortness of breath from ems.  They gave her albuterol 5mg  en route.  Pt had dialysis yesterday instead .  PT has low blood pressure at triage and does not feel right

## 2012-04-08 NOTE — ED Notes (Signed)
[facility] contact number # [phone] Onalee Hua

## 2012-04-08 NOTE — ED Provider Notes (Signed)
History     CSN: [id number]  Arrival date & time 04/08/12  1541   First MD Initiated Contact with Patient 04/08/12 1619      Chief Complaint  Patient presents with  . Shortness of Breath    (Consider location/radiation/quality/duration/timing/severity/associated sxs/prior treatment) HPI Samantha [name] is a 66 y.o. female who presents with complaint of shortness of breath and abdominal pain. Pt states shortness of breath and pain started around 2am this morning, constant. State no prior hx of lung or heart problems. Pt is a dialysis pt, last dialysis yesterday. States no chest pain. No URI symptoms. No cough. No fever. No n/v/d. Pt denies smoking or drinking alcohol. Did not take any medications for this. PT with recent cholecystectomy 3 mon ago Past Medical History  Diagnosis Date  . Shoulder pain   . Headache     occasionally  . Dizziness   . Occasional numbness/prickling/tingling of fingers and toes   . Arthritis     left shoulder  . Joint pain   . Joint swelling   . Gastric ulcer   . Dry skin   . Peripheral edema   . Constipation   . Oligouria   . H/O: GI bleed   . Hepatitis     Hep B  . History of kidney stones   . History of blood transfusion   . Hypertension   . Anemia   . Hypothyroidism     takes Synthroid daily  . Impaired hearing   . Diabetes mellitus     borderline  . Renal disorder     m, w, F     Past Surgical History  Procedure Date  . Shoulder surgery 3-27yrs ago    right replacement  . Esophagogastroduodenoscopy 06/09/2011    Procedure: ESOPHAGOGASTRODUODENOSCOPY (EGD);  Surgeon: Theda Belfast, MD;  Location: [facility] ENDOSCOPY;  Service: Endoscopy;  Laterality: N/A;  . Cataract surgery     bilateral  . Reverse shoulder arthroplasty 09/22/2011    Procedure: REVERSE SHOULDER ARTHROPLASTY;  Surgeon: Verlee Rossetti, MD;  Location: [facility] OR;  Service: Orthopedics;  Laterality: Left;  left reverse shoulder arthroplasty  . Cholecystectomy 12/26/2011  .  Cholecystectomy 12/26/2011    Procedure: LAPAROSCOPIC CHOLECYSTECTOMY;  Surgeon: Atilano Ina, MD,FACS;  Location: [facility] OR;  Service: General;  Laterality: N/A;    No family history on file.  History  Substance Use Topics  . Smoking status: Never Smoker   . Smokeless tobacco: Current User    Types: Chew  . Alcohol Use: No     Comment: quit 4-53yrs ago    OB History    Grav Para Term Preterm Abortions TAB SAB Ect Mult Living                  Review of Systems  Constitutional: Negative for fever, chills and diaphoresis.  HENT: Negative for neck pain and neck stiffness.   Eyes: Negative.   Respiratory: Positive for chest tightness and shortness of breath.   Cardiovascular: Negative.   Gastrointestinal: Positive for abdominal pain. Negative for nausea, vomiting, diarrhea and constipation.  Genitourinary: Negative for dysuria, hematuria, flank pain and pelvic pain.  Musculoskeletal: Negative.   Skin: Negative.   Neurological: Negative for dizziness, weakness and headaches.  Hematological: Negative.     Allergies  Review of patient's allergies indicates no known allergies.  Home Medications   Current Outpatient Rx  Name  Route  Sig  Dispense  Refill  . CALCIUM ACETATE 667 MG PO  CAPS   Oral   Take 1,334 mg by mouth 3 (three) times daily with meals.         Marland Kitchen DICLOFENAC SODIUM 1 % TD GEL   Topical   Apply 2 g topically daily as needed. For pain         . LEVOTHYROXINE SODIUM 100 MCG PO TABS   Oral   Take 100 mcg by mouth daily.         . ADULT MULTIVITAMIN W/MINERALS CH   Oral   Take 1 tablet by mouth daily.           BP 90/53  Pulse 92  Temp 97.8 F (36.6 C) (Oral)  Resp 24  SpO2 100%  Physical Exam  Nursing note and vitals reviewed. Constitutional: She is oriented to person, place, and time. She appears well-developed and well-nourished. No distress.  Eyes: Conjunctivae normal are normal.  Neck: Neck supple.  Cardiovascular: Normal rate, regular  rhythm and normal heart sounds.   Pulmonary/Chest: Effort normal and breath sounds normal. No respiratory distress. She has no wheezes. She has no rales.  Abdominal: Soft. Bowel sounds are normal. She exhibits no distension. There is tenderness. There is no rebound and no guarding.       Epigastric tenderness  Musculoskeletal: She exhibits no edema.  Neurological: She is alert and oriented to person, place, and time.  Skin: Skin is warm and dry.  Psychiatric: She has a normal mood and affect. Her behavior is normal.    ED Course  Procedures (including critical care time)  PT with SOB onset 2 am, upper abdominal pain. No fever, nausea, vomiting. VS normal other than hypotensive, looking back, this is not unusual for the pt. Pt is a dialysis pt, dialyized yesterday. Will get labs, CXR, monitor.   Results for orders placed during the hospital encounter of 04/08/12  CBC WITH DIFFERENTIAL      Component Value Range   WBC 2.9 (*) 4.0 - 10.5 K/uL   RBC 3.88  3.87 - 5.11 MIL/uL   Hemoglobin 12.6  12.0 - 15.0 g/dL   HCT 40.9  81.1 - 91.4 %   MCV 98.2  78.0 - 100.0 fL   MCH 32.5  26.0 - 34.0 pg   MCHC 33.1  30.0 - 36.0 g/dL   RDW 78.2  95.6 - 21.3 %   Platelets 87 (*) 150 - 400 K/uL   Neutrophils Relative 38 (*) 43 - 77 %   Neutro Abs 1.1 (*) 1.7 - 7.7 K/uL   Lymphocytes Relative 43  12 - 46 %   Lymphs Abs 1.2  0.7 - 4.0 K/uL   Monocytes Relative 13 (*) 3 - 12 %   Monocytes Absolute 0.4  0.1 - 1.0 K/uL   Eosinophils Relative 5  0 - 5 %   Eosinophils Absolute 0.1  0.0 - 0.7 K/uL   Basophils Relative 1  0 - 1 %   Basophils Absolute 0.0  0.0 - 0.1 K/uL  COMPREHENSIVE METABOLIC PANEL      Component Value Range   Sodium 129 (*) 135 - 145 mEq/L   Potassium 3.5  3.5 - 5.1 mEq/L   Chloride 85 (*) 96 - 112 mEq/L   CO2 26  19 - 32 mEq/L   Glucose, Bld 93  70 - 99 mg/dL   BUN 18  6 - 23 mg/dL   Creatinine, Ser 0.86 (*) 0.50 - 1.10 mg/dL   Calcium 57.8 (*) 8.4 - 10.5 mg/dL  Total Protein  8.3  6.0 - 8.3 g/dL   Albumin 3.3 (*) 3.5 - 5.2 g/dL   AST 30  0 - 37 U/L   ALT 15  0 - 35 U/L   Alkaline Phosphatase 88  39 - 117 U/L   Total Bilirubin 0.8  0.3 - 1.2 mg/dL   GFR calc non Af Amer 6 (*) >90 mL/min   GFR calc Af Amer 7 (*) >90 mL/min  LIPASE, BLOOD      Component Value Range   Lipase 229 (*) 11 - 59 U/L  POCT I-STAT TROPONIN I      Component Value Range   Troponin i, poc 0.03  0.00 - 0.08 ng/mL   Comment 3            Results for orders placed during the hospital encounter of 04/08/12  CBC WITH DIFFERENTIAL      Component Value Range   WBC 2.9 (*) 4.0 - 10.5 K/uL   RBC 3.88  3.87 - 5.11 MIL/uL   Hemoglobin 12.6  12.0 - 15.0 g/dL   HCT 16.1  09.6 - 04.5 %   MCV 98.2  78.0 - 100.0 fL   MCH 32.5  26.0 - 34.0 pg   MCHC 33.1  30.0 - 36.0 g/dL   RDW 40.9  81.1 - 91.4 %   Platelets 87 (*) 150 - 400 K/uL   Neutrophils Relative 38 (*) 43 - 77 %   Neutro Abs 1.1 (*) 1.7 - 7.7 K/uL   Lymphocytes Relative 43  12 - 46 %   Lymphs Abs 1.2  0.7 - 4.0 K/uL   Monocytes Relative 13 (*) 3 - 12 %   Monocytes Absolute 0.4  0.1 - 1.0 K/uL   Eosinophils Relative 5  0 - 5 %   Eosinophils Absolute 0.1  0.0 - 0.7 K/uL   Basophils Relative 1  0 - 1 %   Basophils Absolute 0.0  0.0 - 0.1 K/uL  COMPREHENSIVE METABOLIC PANEL      Component Value Range   Sodium 129 (*) 135 - 145 mEq/L   Potassium 3.5  3.5 - 5.1 mEq/L   Chloride 85 (*) 96 - 112 mEq/L   CO2 26  19 - 32 mEq/L   Glucose, Bld 93  70 - 99 mg/dL   BUN 18  6 - 23 mg/dL   Creatinine, Ser 7.82 (*) 0.50 - 1.10 mg/dL   Calcium 95.6 (*) 8.4 - 10.5 mg/dL   Total Protein 8.3  6.0 - 8.3 g/dL   Albumin 3.3 (*) 3.5 - 5.2 g/dL   AST 30  0 - 37 U/L   ALT 15  0 - 35 U/L   Alkaline Phosphatase 88  39 - 117 U/L   Total Bilirubin 0.8  0.3 - 1.2 mg/dL   GFR calc non Af Amer 6 (*) >90 mL/min   GFR calc Af Amer 7 (*) >90 mL/min  LIPASE, BLOOD      Component Value Range   Lipase 229 (*) 11 - 59 U/L  POCT I-STAT TROPONIN I       Component Value Range   Troponin i, poc 0.03  0.00 - 0.08 ng/mL   Comment 3              Dg Chest Portable 1 View  04/08/2012  *RADIOLOGY REPORT*  Clinical Data: Lower chest pain, shortness of breath, diabetes  PORTABLE CHEST - 1 VIEW  Comparison: Portable exam 1711 hours compared to 09/22/2011  Findings: Enlargement of cardiac silhouette. Calcified tortuous aorta. Pulmonary vascularity normal. Peribronchial thickening without infiltrate, pleural effusion or pneumothorax. Bones demineralized. Prior left shoulder replacement.  IMPRESSION: Mild enlargement of cardiac silhouette. Bronchitic changes without acute infiltrate.   Original Report Authenticated By: Ulyses Southward, M.D.     Pt continues to have SOB, although improved with nebs. CXR negative, she is afebrile, doubt pneumonia, no antibiotics ordered at this time.  Her oxygen sat is 100% on RA, she is not tachycardic, or tachypanec, doubt PE. Her lipase is elevated, recent cholecystectomy. Will admit to medicine for monitoring and further evaluation.   Spoke with [facility], asked to get CT abd and CT of chest. Will order. Pt admitted to them.   1. Shortness of breath   2. Pancreatitis   3. Abdominal pain   4. ESRD on hemodialysis       MDM          Lottie Mussel, [place] 04/09/12 [phone]

## 2012-04-08 NOTE — H&P (Addendum)
Samantha [name] is an 66 y.o. female.  Patient was seen examined on April 08, 2012.  Chief Complaint: Shortness of breath and abdominal pain. HPI: 66 year-old female with history of ESRD on hemodialysis presents with complaints of worsening shortness of breath last 24 hours with epigastric pain. Patient states that she has been compliant with her dialysis and her dialysis schedule as per patient was recently changed to include Sunday. Patient states she also has been having some nonproductive cough but denies any fever chills or chest pain. In addition patient has been having epigastric discomfort last 2 days associated with some nausea and at least one or 2 episodes of vomiting. Denies any diarrhea. The pain is mostly epigastric area and comes episodic and patient states when it happens she feels like some knot. At this time patient has been admitted for further management. Patient's lipase was found to be mildly elevated.  Past Medical History  Diagnosis Date  . Shoulder pain   . Headache     occasionally  . Dizziness   . Occasional numbness/prickling/tingling of fingers and toes   . Arthritis     left shoulder  . Joint pain   . Joint swelling   . Gastric ulcer   . Dry skin   . Peripheral edema   . Constipation   . Oligouria   . H/O: GI bleed   . Hepatitis     Hep B  . History of kidney stones   . History of blood transfusion   . Hypertension   . Anemia   . Hypothyroidism     takes Synthroid daily  . Impaired hearing   . Diabetes mellitus     borderline  . Renal disorder     m, w, F     Past Surgical History  Procedure Date  . Shoulder surgery 3-92yrs ago    right replacement  . Esophagogastroduodenoscopy 06/09/2011    Procedure: ESOPHAGOGASTRODUODENOSCOPY (EGD);  Surgeon: Theda Belfast, MD;  Location: [facility] ENDOSCOPY;  Service: Endoscopy;  Laterality: N/A;  . Cataract surgery     bilateral  . Reverse shoulder arthroplasty 09/22/2011    Procedure: REVERSE SHOULDER  ARTHROPLASTY;  Surgeon: Verlee Rossetti, MD;  Location: [facility] OR;  Service: Orthopedics;  Laterality: Left;  left reverse shoulder arthroplasty  . Cholecystectomy 12/26/2011  . Cholecystectomy 12/26/2011    Procedure: LAPAROSCOPIC CHOLECYSTECTOMY;  Surgeon: Atilano Ina, MD,FACS;  Location: [facility] OR;  Service: General;  Laterality: N/A;    History reviewed. No pertinent family history. Social History:  reports that she has never smoked. Her smokeless tobacco use includes Chew. She reports that she does not drink alcohol or use illicit drugs.  Allergies: No Known Allergies   (Not in a hospital admission)  Results for orders placed during the hospital encounter of 04/08/12 (from the past 48 hour(s))  CBC WITH DIFFERENTIAL     Status: Abnormal   Collection Time   04/08/12  5:22 PM      Component Value Range Comment   WBC 2.9 (*) 4.0 - 10.5 K/uL    RBC 3.88  3.87 - 5.11 MIL/uL    Hemoglobin 12.6  12.0 - 15.0 g/dL    HCT 16.1  09.6 - 04.5 %    MCV 98.2  78.0 - 100.0 fL    MCH 32.5  26.0 - 34.0 pg    MCHC 33.1  30.0 - 36.0 g/dL    RDW 40.9  81.1 - 91.4 %    Platelets  87 (*) 150 - 400 K/uL PLATELET COUNT CONFIRMED BY SMEAR   Neutrophils Relative 38 (*) 43 - 77 %    Neutro Abs 1.1 (*) 1.7 - 7.7 K/uL    Lymphocytes Relative 43  12 - 46 %    Lymphs Abs 1.2  0.7 - 4.0 K/uL    Monocytes Relative 13 (*) 3 - 12 %    Monocytes Absolute 0.4  0.1 - 1.0 K/uL    Eosinophils Relative 5  0 - 5 %    Eosinophils Absolute 0.1  0.0 - 0.7 K/uL    Basophils Relative 1  0 - 1 %    Basophils Absolute 0.0  0.0 - 0.1 K/uL   COMPREHENSIVE METABOLIC PANEL     Status: Abnormal   Collection Time   04/08/12  5:22 PM      Component Value Range Comment   Sodium 129 (*) 135 - 145 mEq/L    Potassium 3.5  3.5 - 5.1 mEq/L    Chloride 85 (*) 96 - 112 mEq/L    CO2 26  19 - 32 mEq/L    Glucose, Bld 93  70 - 99 mg/dL    BUN 18  6 - 23 mg/dL    Creatinine, Ser 1.61 (*) 0.50 - 1.10 mg/dL    Calcium 09.6 (*) 8.4 - 10.5  mg/dL    Total Protein 8.3  6.0 - 8.3 g/dL    Albumin 3.3 (*) 3.5 - 5.2 g/dL    AST 30  0 - 37 U/L    ALT 15  0 - 35 U/L    Alkaline Phosphatase 88  39 - 117 U/L    Total Bilirubin 0.8  0.3 - 1.2 mg/dL    GFR calc non Af Amer 6 (*) >90 mL/min    GFR calc Af Amer 7 (*) >90 mL/min   LIPASE, BLOOD     Status: Abnormal   Collection Time   04/08/12  5:23 PM      Component Value Range Comment   Lipase 229 (*) 11 - 59 U/L   POCT I-STAT TROPONIN I     Status: Normal   Collection Time   04/08/12  5:56 PM      Component Value Range Comment   Troponin i, poc 0.03  0.00 - 0.08 ng/mL    Comment 3             Dg Chest Portable 1 View  04/08/2012  *RADIOLOGY REPORT*  Clinical Data: Lower chest pain, shortness of breath, diabetes  PORTABLE CHEST - 1 VIEW  Comparison: Portable exam 1711 hours compared to 09/22/2011  Findings: Enlargement of cardiac silhouette. Calcified tortuous aorta. Pulmonary vascularity normal. Peribronchial thickening without infiltrate, pleural effusion or pneumothorax. Bones demineralized. Prior left shoulder replacement.  IMPRESSION: Mild enlargement of cardiac silhouette. Bronchitic changes without acute infiltrate.   Original Report Authenticated By: Ulyses Southward, M.D.     Review of Systems  Constitutional: Negative.   HENT: Negative.   Eyes: Negative.   Respiratory: Positive for shortness of breath.   Cardiovascular: Negative.   Gastrointestinal: Positive for abdominal pain.  Genitourinary: Negative.   Musculoskeletal: Negative.   Skin: Negative.   Neurological: Negative.   Endo/Heme/Allergies: Negative.   Psychiatric/Behavioral: Negative.     Blood pressure 100/47, pulse 98, temperature 97.8 F (36.6 C), temperature source Oral, resp. rate 15, SpO2 99.00%. Physical Exam  Constitutional: She is oriented to person, place, and time. She appears well-developed and well-nourished. No distress.  HENT:  Head: Normocephalic and atraumatic.  Right Ear: External ear  normal.  Left Ear: External ear normal.  Nose: Nose normal.  Mouth/Throat: Oropharynx is clear and moist. No oropharyngeal exudate.  Eyes: Conjunctivae normal are normal. Pupils are equal, round, and reactive to light. Right eye exhibits no discharge. Left eye exhibits no discharge. No scleral icterus.  Neck: Normal range of motion. Neck supple.  Cardiovascular: Normal rate and regular rhythm.   Respiratory: Effort normal and breath sounds normal. No respiratory distress. She has no wheezes. She has no rales.  GI: Soft. Bowel sounds are normal. She exhibits no distension. There is no tenderness. There is no rebound.  Musculoskeletal: She exhibits edema. She exhibits no tenderness.  Neurological: She is alert and oriented to person, place, and time.       Moves all extremities.  Skin: Skin is warm. She is not diaphoretic.     Assessment/Plan #1. Shortness of breath the patient with history of ESRD on hemodialysis - since patient is complaining of persistent shortness of breath and not in acute distress we will get a CT angiogram of the chest to rule out PE. If PE is negative then her shortness of breath is most likely from fluid overload but patient is not in acute respiratory distress at this time. #2. Abdominal pain with elevated lipase with a recent cholecystectomy for symptomatic cholelithiasis in September this year - will get CT abdomen pelvis to check for any pancreatitis or CBD stones. Further recommendations based on the CT findings. Add Protonix IV. #3. ESRD on hemodialysis - consult nephrologist in a.m. #4. Hypothyroidism - continue Synthroid. #5. Chronic thrombocytopenia and leukopenia - follow CBC. #6. History of hypertension controlled by volume. #7. Recently diagnosed hepatitis B - further workup per primary.  CODE STATUS - full code.  Eduard Clos 04/08/2012, 8:45 PM Addendum - patient's CT shows negative for any PE but possibility of pneumonia. I have started  patient on empiric antibiotics for health care associated pneumonia.  Midge Minium

## 2012-04-09 ENCOUNTER — Encounter (HOSPITAL_COMMUNITY): Payer: Self-pay | Admitting: *Deleted

## 2012-04-09 DIAGNOSIS — Z992 Dependence on renal dialysis: Secondary | ICD-10-CM

## 2012-04-09 DIAGNOSIS — J189 Pneumonia, unspecified organism: Secondary | ICD-10-CM | POA: Diagnosis present

## 2012-04-09 DIAGNOSIS — N186 End stage renal disease: Secondary | ICD-10-CM

## 2012-04-09 DIAGNOSIS — I959 Hypotension, unspecified: Secondary | ICD-10-CM | POA: Diagnosis present

## 2012-04-09 DIAGNOSIS — K859 Acute pancreatitis without necrosis or infection, unspecified: Secondary | ICD-10-CM

## 2012-04-09 LAB — TROPONIN I: Troponin I: <0.3 ng/mL (ref <0.30)

## 2012-04-09 LAB — CBC
Platelets: 62 10*3/uL — ABNORMAL LOW (ref 150–400)
RDW: 14.9 % (ref 11.5–15.5)
WBC: 2.3 10*3/uL — ABNORMAL LOW (ref 4.0–10.5)

## 2012-04-09 LAB — BASIC METABOLIC PANEL
Calcium: 8.9 mg/dL (ref 8.4–10.5)
Creatinine, Ser: 6.71 mg/dL — ABNORMAL HIGH (ref 0.50–1.10)
GFR calc Af Amer: 7 mL/min — ABNORMAL LOW (ref >90)
GFR calc non Af Amer: 6 mL/min — ABNORMAL LOW (ref >90)

## 2012-04-09 LAB — PRO B NATRIURETIC PEPTIDE: Pro B Natriuretic peptide (BNP): 3863 pg/mL — ABNORMAL HIGH (ref 0–125)

## 2012-04-09 MED ORDER — LEVOFLOXACIN IN D5W 500 MG/100ML IV SOLN
500.0000 mg | INTRAVENOUS | Status: DC
  Administered 2012-04-10 – 2012-04-13 (×2): 500 mg via INTRAVENOUS
  Filled 2012-04-09 (×2): qty 100

## 2012-04-09 MED ORDER — SODIUM CHLORIDE 0.9 % IV BOLUS (SEPSIS)
500.0000 mL | Freq: Once | INTRAVENOUS | Status: AC
  Administered 2012-04-09: 500 mL via INTRAVENOUS

## 2012-04-09 MED ORDER — ALTEPLASE 2 MG IJ SOLR
2.0000 mg | Freq: Once | INTRAMUSCULAR | Status: DC | PRN
  Filled 2012-04-09: qty 2

## 2012-04-09 MED ORDER — LEVOFLOXACIN IN D5W 500 MG/100ML IV SOLN: 500.0000 mg | INTRAVENOUS | Status: DC

## 2012-04-09 MED ORDER — ONDANSETRON HCL 4 MG/2ML IJ SOLN: 4.0000 mg | Freq: Four times a day (QID) | INTRAMUSCULAR | Status: DC | PRN

## 2012-04-09 MED ORDER — LIDOCAINE-PRILOCAINE 2.5-2.5 % EX CREA
1.0000 "application " | TOPICAL_CREAM | CUTANEOUS | Status: DC | PRN
  Filled 2012-04-09: qty 5

## 2012-04-09 MED ORDER — CALCIUM ACETATE 667 MG PO CAPS
1334.0000 mg | ORAL_CAPSULE | Freq: Three times a day (TID) | ORAL | Status: DC
  Administered 2012-04-09 – 2012-04-13 (×12): 1334 mg via ORAL
  Filled 2012-04-09 (×16): qty 2

## 2012-04-09 MED ORDER — DEXTROSE 5 % IV SOLN: 2.0000 g | INTRAVENOUS | Status: DC

## 2012-04-09 MED ORDER — SODIUM CHLORIDE 0.9 % IV SOLN: 100.0000 mL | INTRAVENOUS | Status: DC | PRN

## 2012-04-09 MED ORDER — RENA-VITE PO TABS
1.0000 | ORAL_TABLET | Freq: Every day | ORAL | Status: DC
  Administered 2012-04-09 – 2012-04-12 (×4): 1 via ORAL
  Filled 2012-04-09 (×5): qty 1

## 2012-04-09 MED ORDER — DOXERCALCIFEROL 4 MCG/2ML IV SOLN
INTRAVENOUS | Status: AC
  Administered 2012-04-09: 2 ug via INTRAVENOUS
  Filled 2012-04-09: qty 2

## 2012-04-09 MED ORDER — PENTAFLUOROPROP-TETRAFLUOROETH EX AERO: 1.0000 "application " | INHALATION_SPRAY | CUTANEOUS | Status: DC | PRN

## 2012-04-09 MED ORDER — DOXERCALCIFEROL 4 MCG/2ML IV SOLN
2.0000 ug | INTRAVENOUS | Status: DC
  Administered 2012-04-09 – 2012-04-12 (×2): 2 ug via INTRAVENOUS
  Filled 2012-04-09 (×3): qty 2

## 2012-04-09 MED ORDER — LIDOCAINE HCL (PF) 1 % IJ SOLN: 5.0000 mL | INTRAMUSCULAR | Status: DC | PRN

## 2012-04-09 MED ORDER — VANCOMYCIN HCL 1000 MG IV SOLR
750.0000 mg | Freq: Once | INTRAVENOUS | Status: DC
  Filled 2012-04-09 (×2): qty 750

## 2012-04-09 MED ORDER — NEPRO/CARBSTEADY PO LIQD
237.0000 mL | ORAL | Status: DC | PRN
  Filled 2012-04-09: qty 237

## 2012-04-09 MED ORDER — VANCOMYCIN HCL 10 G IV SOLR
1750.0000 mg | Freq: Once | INTRAVENOUS | Status: AC
  Administered 2012-04-09: 1750 mg via INTRAVENOUS
  Filled 2012-04-09: qty 1750

## 2012-04-09 MED ORDER — HEPARIN SODIUM (PORCINE) 1000 UNIT/ML DIALYSIS: 1000.0000 [IU] | INTRAMUSCULAR | Status: DC | PRN

## 2012-04-09 MED ORDER — DEXTROSE 5 % IV SOLN
2.0000 g | Freq: Once | INTRAVENOUS | Status: AC
  Administered 2012-04-09: 2 g via INTRAVENOUS
  Filled 2012-04-09: qty 2

## 2012-04-09 MED ORDER — LEVOTHYROXINE SODIUM 100 MCG PO TABS
100.0000 ug | ORAL_TABLET | Freq: Every day | ORAL | Status: DC
  Administered 2012-04-09 – 2012-04-13 (×4): 100 ug via ORAL
  Filled 2012-04-09 (×6): qty 1

## 2012-04-09 MED ORDER — ONDANSETRON HCL 4 MG PO TABS: 4.0000 mg | ORAL_TABLET | Freq: Four times a day (QID) | ORAL | Status: DC | PRN

## 2012-04-09 MED ORDER — SODIUM CHLORIDE 0.9 % IJ SOLN
3.0000 mL | Freq: Two times a day (BID) | INTRAMUSCULAR | Status: DC
  Administered 2012-04-09 – 2012-04-13 (×8): 3 mL via INTRAVENOUS

## 2012-04-09 MED ORDER — VANCOMYCIN HCL 1000 MG IV SOLR: 750.0000 mg | INTRAVENOUS | Status: DC

## 2012-04-09 MED ORDER — ACETAMINOPHEN 650 MG RE SUPP: 650.0000 mg | Freq: Four times a day (QID) | RECTAL | Status: DC | PRN

## 2012-04-09 MED ORDER — VANCOMYCIN HCL 1000 MG IV SOLR: 750.0000 mg | Freq: Once | INTRAVENOUS | Status: DC

## 2012-04-09 MED ORDER — HEPARIN SODIUM (PORCINE) 1000 UNIT/ML DIALYSIS: 20.0000 [IU]/kg | INTRAMUSCULAR | Status: DC | PRN

## 2012-04-09 MED ORDER — LEVOFLOXACIN IN D5W 750 MG/150ML IV SOLN
750.0000 mg | Freq: Once | INTRAVENOUS | Status: AC
  Administered 2012-04-09: 750 mg via INTRAVENOUS
  Filled 2012-04-09: qty 150

## 2012-04-09 MED ORDER — SODIUM CHLORIDE 0.9 % IJ SOLN
3.0000 mL | Freq: Two times a day (BID) | INTRAMUSCULAR | Status: DC
  Administered 2012-04-09 – 2012-04-12 (×4): 3 mL via INTRAVENOUS

## 2012-04-09 MED ORDER — ACETAMINOPHEN 325 MG PO TABS
650.0000 mg | ORAL_TABLET | Freq: Four times a day (QID) | ORAL | Status: DC | PRN
  Administered 2012-04-10 – 2012-04-13 (×5): 650 mg via ORAL
  Filled 2012-04-09 (×5): qty 2

## 2012-04-09 MED ORDER — ADULT MULTIVITAMIN W/MINERALS CH: 1.0000 | ORAL_TABLET | Freq: Every day | ORAL | Status: DC

## 2012-04-09 MED ORDER — PANTOPRAZOLE SODIUM 40 MG IV SOLR
40.0000 mg | INTRAVENOUS | Status: DC
  Administered 2012-04-09 – 2012-04-13 (×5): 40 mg via INTRAVENOUS
  Filled 2012-04-09 (×5): qty 40

## 2012-04-09 NOTE — Progress Notes (Signed)
Received patient from  Ed via stretcher. Alert and oriented x 3 informed pt of current time.Oriented to room ,call bell, and equipment.

## 2012-04-09 NOTE — ED Provider Notes (Signed)
Medical screening examination/treatment/procedure(s) were conducted as a shared visit with non-physician practitioner(s) and myself.  I personally evaluated the patient during the encounter 66 yo woman on dialysis with shortness of breath and upper abdominal pain.  Exam shows epigastric tenderness.  Lab workup shows elevated lipase.  I asked pt if she was drinking, and she said she had not drank alcohol for seven or eight years.  Advised admission for this frail elderly woman on dialysis with dyspnea of unclear cause and elevated lipase suggesting pancreatitis.  Carleene Cooper III, MD 04/09/12 [phone]

## 2012-04-09 NOTE — Progress Notes (Signed)
[facility] PROGRESS NOTE  Samantha [name] WUJ:[id number] DOB: 02-Sep-1945 DOA: 04/08/2012 PCP: Dorrene German, MD  Assessment/Plan:  Healthcare associated pneumonia Patient presented with shortness of breath likely related to pneumonia which was evident on chest x-ray. Patient placed on IV vancomycin, cefepime and Levaquin. Will followup with blood cultures and sputum culture. Check urine for Legionella antigen   Abdominal pain Patient complains of epigastric discomfort with elevated lipase. She recently had a cholecystectomy. Abdominal CT scan unremarkable for pancreatitis or CBD stone. We'll place patient on clear liquids and monitor. Check lipase in the morning. -Added Protonix - serial abdominal exam  End-stage renal disease Patient on hemodialysis Sunday Tuesday and Fridays. Renal team has been notified.  Hypotension Patient presented with low blood pressure possibly in the setting of underlying infection. Was given 1 L IV normal saline. Blood pressure currently stable.  Chronic leukopenia and thrombocytopenia Continue to monitor Hypothyroidism Continue Synthroid  Code Status: Full Family Communication: None at bedside Disposition Plan: Home once stable   Consultants:  [facility]  Procedures:  Scheduled hemodialysis  Antibiotics:    HPI/Subjective: Complains of some epigastric pain  Objective: Filed Vitals:   04/09/12 1430 04/09/12 1456 04/09/12 1500 04/09/12 1530  BP: 84/50 90/48 87/52  90/50  Pulse: 75 75 76 74  Temp:      TempSrc:      Resp: 12 16 13 14   Height:      Weight:      SpO2:        Intake/Output Summary (Last 24 hours) at 04/09/12 1547 Last data filed at 04/09/12 1111  Gross per 24 hour  Intake    600 ml  Output      4 ml  Net    596 ml   Filed Weights   04/09/12 0051 04/09/12 1321  Weight: 78.3 kg (172 lb 9.9 oz) 81.8 kg (180 lb 5.4 oz)    Exam:   General:  Elderly female in no acute distress, hard  of  hearing  HEENT: No pallor, moist oral mucosa  Cardiovascular: Normal S1 and S2, no murmurs  Respiratory: Equal air entry bilaterally, no crackles, no wheezing or rhonchi  Abdomen: Soft, mildly tender on palpation over epigastric area, nondistended, bowel sounds present Extremities: Warm, trace edema CNS: AAO x3  Data Reviewed: Basic Metabolic Panel:  Lab 04/09/12 9562 04/08/12 1722  NA 122* 129*  K 3.8 3.5  CL 84* 85*  CO2 20 26  GLUCOSE 71 93  BUN 23 18  CREATININE 6.71* 6.38*  CALCIUM 8.9 10.8*  MG -- --  PHOS -- --   Liver Function Tests:  Lab 04/08/12 1722  AST 30  ALT 15  ALKPHOS 88  BILITOT 0.8  PROT 8.3  ALBUMIN 3.3*    Lab 04/08/12 1723  LIPASE 229*  AMYLASE --   No results found for this basename: AMMONIA:5 in the last 168 hours CBC:  Lab 04/09/12 0500 04/08/12 1722  WBC 2.3* 2.9*  NEUTROABS -- 1.1*  HGB 10.8* 12.6  HCT 32.5* 38.1  MCV 97.9 98.2  PLT 62* 87*   Cardiac Enzymes:  Lab 04/09/12 0225  CKTOTAL --  CKMB --  CKMBINDEX --  TROPONINI <0.30   BNP (last 3 results)  Basename 04/09/12 0225  PROBNP 3863.0*   CBG: No results found for this basename: GLUCAP:5 in the last 168 hours  Recent Results (from the past 240 hour(s))  MRSA PCR SCREENING     Status: Normal   Collection Time  04/09/12  3:56 AM      Component Value Range Status Comment   MRSA by PCR NEGATIVE  NEGATIVE Final      Studies: Ct Angio Chest Pe W/cm &/or Wo Cm  04/08/2012  *RADIOLOGY REPORT*  Clinical Data:  Shortness of breath, upper abdominal pain, on dialysis  CT ANGIOGRAPHY CHEST CT ABDOMEN AND PELVIS WITH CONTRAST  Technique:  Multidetector CT imaging of the chest was performed using the standard protocol during bolus administration of intravenous contrast.  Multiplanar CT image reconstructions including MIPs were obtained to evaluate the vascular anatomy. Multidetector CT imaging of the abdomen and pelvis was performed using the standard protocol during  bolus administration of intravenous contrast.  Contrast: OMNIPAQUE IOHEXOL 350 MG/ML SOLN  Comparison:  Chest radiograph dated 04/08/2012.  CTA CHEST  Findings:  No evidence of pulmonary embolism.  Patchy left lower lobe opacity, suspicious for pneumonia.  Mild patchy right basilar opacity, atelectasis versus pneumonia. No pleural effusion or pneumothorax.  The heart is mildly enlarged.  No pericardial effusion.  Mild atherosclerotic calcifications of the aortic arch.  Small mediastinal lymph nodes which do not meet pathologic CT size criteria.  No suspicious hilar or axillary lymphadenopathy.  Lower esophagus is mildly thick-walled, nonspecific.  Mild degenerative changes of the visualized thoracolumbar spine. Renal osteodystrophy.  Review of the MIP images confirms the above findings.  IMPRESSION: No evidence of pulmonary embolism.  Patchy bilateral lower lobe opacities, left greater than right, suspicious for pneumonia.  Mild lower esophageal wall thickening, correlate for esophagitis.  CT ABDOMEN AND PELVIS  Findings: Cirrhotic configuration.  No suspicious/thin enhancing hepatic lesions.  Spleen is normal in size, measuring 11.7 cm in craniocaudal dimension.  Gastroesophageal varices.  Portal vein remains patent.  No peripancreatic inflammatory changes by CT.  Adrenal glands are unremarkable.  Status post cholecystectomy.  No intrahepatic ductal dilatation. Common duct measures 14 mm (series 13/image 30) but tapers at the ampulla.  Bilateral renal atrophy.  10 mm left renal cyst (series 18/image 12).  No hydronephrosis.  No evidence of bowel obstruction.  Normal appendix.  Atherosclerotic calcifications of the abdominal aorta and branch vessels.  No abdominopelvic ascites.  No suspicious abdominopelvic lymphadenopathy.  Uterus and right ovary are unremarkable.  Suspected left ovarian cyst (series 13/image 61), although incompletely characterized.  Bladder is decompressed.  Degenerative changes of the  lumbar spine.  Renal osteodystrophy.  Review of the MIP images confirms the above findings.  IMPRESSION: Cirrhosis with gastroesophageal varices.  Portal vein remains patent.  Status post cholecystectomy.  Common duct measures 14 mm but tapers at the ampulla.  In the setting of normal LFTs, this is likely postsurgical.  No peripancreatic inflammatory changes by CT.  Suspected left ovarian cyst, incompletely characterized.  Given postmenopausal status, consider nonemergent pelvic ultrasound for further evaluation.   Original Report Authenticated By: Charline Bills, M.D.    Ct Abdomen Pelvis W Contrast  04/08/2012  *RADIOLOGY REPORT*  Clinical Data:  Shortness of breath, upper abdominal pain, on dialysis  CT ANGIOGRAPHY CHEST CT ABDOMEN AND PELVIS WITH CONTRAST  Technique:  Multidetector CT imaging of the chest was performed using the standard protocol during bolus administration of intravenous contrast.  Multiplanar CT image reconstructions including MIPs were obtained to evaluate the vascular anatomy. Multidetector CT imaging of the abdomen and pelvis was performed using the standard protocol during bolus administration of intravenous contrast.  Contrast: OMNIPAQUE IOHEXOL 350 MG/ML SOLN  Comparison:  Chest radiograph dated 04/08/2012.  CTA CHEST  Findings:  No evidence of pulmonary embolism.  Patchy left lower lobe opacity, suspicious for pneumonia.  Mild patchy right basilar opacity, atelectasis versus pneumonia. No pleural effusion or pneumothorax.  The heart is mildly enlarged.  No pericardial effusion.  Mild atherosclerotic calcifications of the aortic arch.  Small mediastinal lymph nodes which do not meet pathologic CT size criteria.  No suspicious hilar or axillary lymphadenopathy.  Lower esophagus is mildly thick-walled, nonspecific.  Mild degenerative changes of the visualized thoracolumbar spine. Renal osteodystrophy.  Review of the MIP images confirms the above findings.  IMPRESSION: No  evidence of pulmonary embolism.  Patchy bilateral lower lobe opacities, left greater than right, suspicious for pneumonia.  Mild lower esophageal wall thickening, correlate for esophagitis.  CT ABDOMEN AND PELVIS  Findings: Cirrhotic configuration.  No suspicious/thin enhancing hepatic lesions.  Spleen is normal in size, measuring 11.7 cm in craniocaudal dimension.  Gastroesophageal varices.  Portal vein remains patent.  No peripancreatic inflammatory changes by CT.  Adrenal glands are unremarkable.  Status post cholecystectomy.  No intrahepatic ductal dilatation. Common duct measures 14 mm (series 13/image 30) but tapers at the ampulla.  Bilateral renal atrophy.  10 mm left renal cyst (series 18/image 12).  No hydronephrosis.  No evidence of bowel obstruction.  Normal appendix.  Atherosclerotic calcifications of the abdominal aorta and branch vessels.  No abdominopelvic ascites.  No suspicious abdominopelvic lymphadenopathy.  Uterus and right ovary are unremarkable.  Suspected left ovarian cyst (series 13/image 61), although incompletely characterized.  Bladder is decompressed.  Degenerative changes of the lumbar spine.  Renal osteodystrophy.  Review of the MIP images confirms the above findings.  IMPRESSION: Cirrhosis with gastroesophageal varices.  Portal vein remains patent.  Status post cholecystectomy.  Common duct measures 14 mm but tapers at the ampulla.  In the setting of normal LFTs, this is likely postsurgical.  No peripancreatic inflammatory changes by CT.  Suspected left ovarian cyst, incompletely characterized.  Given postmenopausal status, consider nonemergent pelvic ultrasound for further evaluation.   Original Report Authenticated By: Charline Bills, M.D.    Dg Chest Portable 1 View  04/08/2012  *RADIOLOGY REPORT*  Clinical Data: Lower chest pain, shortness of breath, diabetes  PORTABLE CHEST - 1 VIEW  Comparison: Portable exam 1711 hours compared to 09/22/2011  Findings: Enlargement of  cardiac silhouette. Calcified tortuous aorta. Pulmonary vascularity normal. Peribronchial thickening without infiltrate, pleural effusion or pneumothorax. Bones demineralized. Prior left shoulder replacement.  IMPRESSION: Mild enlargement of cardiac silhouette. Bronchitic changes without acute infiltrate.   Original Report Authenticated By: Ulyses Southward, M.D.     Scheduled Meds:   . calcium acetate  1,334 mg Oral TID WC  . ceFEPime (MAXIPIME) IV  2 g Intravenous Once  . ceFEPime (MAXIPIME) IV  2 g Intravenous Q M,W,F-HD  . doxercalciferol  2 mcg Intravenous Q M,W,F-HD  . levofloxacin (LEVAQUIN) IV  500 mg Intravenous Q48H  . levothyroxine  100 mcg Oral QAC breakfast  . multivitamin  1 tablet Oral QHS  . pantoprazole (PROTONIX) IV  40 mg Intravenous Q24H  . sodium chloride  3 mL Intravenous Q12H  . sodium chloride  3 mL Intravenous Q12H  . vancomycin  750 mg Intravenous Once  . vancomycin  750 mg Intravenous Q M,W,F-HD   Continuous Infusions:     Time spent: 30 minutes    Kashawn Manzano  [facility] Pager [phone]. If 8PM-8AM, please contact night-coverage at [email or web address], password [facility] 04/09/2012, 3:47 PM  LOS: 1 day

## 2012-04-09 NOTE — Procedures (Signed)
Pt seen on HD Ap 30 Vp 90.  BFR 150, HD just starting.  SBP 95 though she runs low BP chronically.  Try for 2 liters.

## 2012-04-09 NOTE — Progress Notes (Signed)
Pt's blood pressure has been running low this am. At 8:10, a 500 mL bolus given IV, BP rose from 75/41 to 80/45. At 10:03, additional 500 mL bolus given IV, BP rose from 84/42 to 89/49. MD's are aware of pt's low blood pressure, say this is normal for pt. Pt c/o occasional dizziness, but otherwise asymptomatic. Will continue to monitor.

## 2012-04-09 NOTE — Consult Note (Signed)
[facility] Renal Consultation Note  Indication for Consultation:  Management of ESRD/hemodialysis; anemia, hypertension/volume and secondary hyperparathyroidism  HPI: Samantha [name] is a 66 y.o. female with ESRD on dialysis on MWF at the [facility] who presented to the ER yesterday with mild dyspnea, nonproductive cough, and epigastric pain with nausea worsening for two days.  She had her dialysis on Sunday 12/22 per holiday schedule without problems, but states that she later vomited twice.  Chest x-ray indicated bronchitic changes without acute infiltrate, and CT angiography showed no evidence of pulmonary embolism.   However, CT also showed bilateral patchy lower lobe opacities (L > R), suspicious for pneumonia, and cirrhosis with gastroesophageal varices and mild lower esophageal wall thickening, suggesting esophagitis.  Lipase was elevated at 229.  She had cholecystectomy in 12/2011 and last had endoscopy on 06/09/11. She remains without fever or chills, but continues to have upper abdominal pain with sore throat.   Dialysis Orders: Center: [facility] on MWF. EDW 75 kg  HD Bath 2K/2.25Ca  Time 3 hrs 45 mins  Heparin 2000 U. Access AVG @ LFA  BFR 400 DFR A1.5   Hectorol 2 mcg IV/HD  Epogen 0 Units IV/HD  Venofer 0.   Past Medical History  Diagnosis Date  . Shoulder pain   . Headache     occasionally  . Dizziness   . Occasional numbness/prickling/tingling of fingers and toes   . Arthritis     left shoulder  . Joint pain   . Joint swelling   . Gastric ulcer   . Dry skin   . Peripheral edema   . Constipation   . Oligouria   . H/O: GI bleed   . Hepatitis     Hep B  . History of kidney stones   . History of blood transfusion   . Hypertension   . Anemia   . Hypothyroidism     takes Synthroid daily  . Impaired hearing   . Diabetes mellitus     borderline  . Renal disorder     m, w, F    Past Surgical History  Procedure Date  . Shoulder surgery  3-4yrs ago    right replacement  . Esophagogastroduodenoscopy 06/09/2011    Procedure: ESOPHAGOGASTRODUODENOSCOPY (EGD);  Surgeon: Theda Belfast, MD;  Location: [facility] ENDOSCOPY;  Service: Endoscopy;  Laterality: N/A;  . Cataract surgery     bilateral  . Reverse shoulder arthroplasty 09/22/2011    Procedure: REVERSE SHOULDER ARTHROPLASTY;  Surgeon: Verlee Rossetti, MD;  Location: [facility] OR;  Service: Orthopedics;  Laterality: Left;  left reverse shoulder arthroplasty  . Cholecystectomy 12/26/2011  . Cholecystectomy 12/26/2011    Procedure: LAPAROSCOPIC CHOLECYSTECTOMY;  Surgeon: Atilano Ina, MD,FACS;  Location: [facility] OR;  Service: General;  Laterality: N/A;   History reviewed. No pertinent family history.  Social History  Both parents died when she was very young, and she was raised by her older sister in [place]. Her husband is deceased, and she lives with the older of her two daughters. She never smoked or used illicit drugs, but drank alcohol heavily until three years ago and continues to chew tobacco.   No Known Allergies Prior to Admission medications   Medication Sig Start Date End Date Taking? Authorizing Provider  calcium acetate (PHOSLO) 667 MG capsule Take 1,334 mg by mouth 3 (three) times daily with meals.   Yes Historical Provider, MD  diclofenac sodium (VOLTAREN) 1 % GEL Apply 2 g topically daily  as needed. For pain   Yes Historical Provider, MD  levothyroxine (SYNTHROID, LEVOTHROID) 100 MCG tablet Take 100 mcg by mouth daily.   Yes Historical Provider, MD  Multiple Vitamin (MULITIVITAMIN WITH MINERALS) TABS Take 1 tablet by mouth daily.   Yes Historical Provider, MD   Labs:  Results for orders placed during the hospital encounter of 04/08/12 (from the past 48 hour(s))  CBC WITH DIFFERENTIAL     Status: Abnormal   Collection Time   04/08/12  5:22 PM      Component Value Range Comment   WBC 2.9 (*) 4.0 - 10.5 K/uL    RBC 3.88  3.87 - 5.11 MIL/uL    Hemoglobin 12.6  12.0 - 15.0  g/dL    HCT 16.1  09.6 - 04.5 %    MCV 98.2  78.0 - 100.0 fL    MCH 32.5  26.0 - 34.0 pg    MCHC 33.1  30.0 - 36.0 g/dL    RDW 40.9  81.1 - 91.4 %    Platelets 87 (*) 150 - 400 K/uL PLATELET COUNT CONFIRMED BY SMEAR   Neutrophils Relative 38 (*) 43 - 77 %    Neutro Abs 1.1 (*) 1.7 - 7.7 K/uL    Lymphocytes Relative 43  12 - 46 %    Lymphs Abs 1.2  0.7 - 4.0 K/uL    Monocytes Relative 13 (*) 3 - 12 %    Monocytes Absolute 0.4  0.1 - 1.0 K/uL    Eosinophils Relative 5  0 - 5 %    Eosinophils Absolute 0.1  0.0 - 0.7 K/uL    Basophils Relative 1  0 - 1 %    Basophils Absolute 0.0  0.0 - 0.1 K/uL   COMPREHENSIVE METABOLIC PANEL     Status: Abnormal   Collection Time   04/08/12  5:22 PM      Component Value Range Comment   Sodium 129 (*) 135 - 145 mEq/L    Potassium 3.5  3.5 - 5.1 mEq/L    Chloride 85 (*) 96 - 112 mEq/L    CO2 26  19 - 32 mEq/L    Glucose, Bld 93  70 - 99 mg/dL    BUN 18  6 - 23 mg/dL    Creatinine, Ser 7.82 (*) 0.50 - 1.10 mg/dL    Calcium 95.6 (*) 8.4 - 10.5 mg/dL    Total Protein 8.3  6.0 - 8.3 g/dL    Albumin 3.3 (*) 3.5 - 5.2 g/dL    AST 30  0 - 37 U/L    ALT 15  0 - 35 U/L    Alkaline Phosphatase 88  39 - 117 U/L    Total Bilirubin 0.8  0.3 - 1.2 mg/dL    GFR calc non Af Amer 6 (*) >90 mL/min    GFR calc Af Amer 7 (*) >90 mL/min   LIPASE, BLOOD     Status: Abnormal   Collection Time   04/08/12  5:23 PM      Component Value Range Comment   Lipase 229 (*) 11 - 59 U/L   POCT I-STAT TROPONIN I     Status: Normal   Collection Time   04/08/12  5:56 PM      Component Value Range Comment   Troponin i, poc 0.03  0.00 - 0.08 ng/mL    Comment 3            TROPONIN I     Status:  Normal   Collection Time   04/09/12  2:25 AM      Component Value Range Comment   Troponin I <0.30  <0.30 ng/mL   PRO B NATRIURETIC PEPTIDE     Status: Abnormal   Collection Time   04/09/12  2:25 AM      Component Value Range Comment   Pro B Natriuretic peptide (BNP) 3863.0 (*) 0  - 125 pg/mL   MRSA PCR SCREENING     Status: Normal   Collection Time   04/09/12  3:56 AM      Component Value Range Comment   MRSA by PCR NEGATIVE  NEGATIVE   BASIC METABOLIC PANEL     Status: Abnormal   Collection Time   04/09/12  5:00 AM      Component Value Range Comment   Sodium 122 (*) 135 - 145 mEq/L    Potassium 3.8  3.5 - 5.1 mEq/L    Chloride 84 (*) 96 - 112 mEq/L    CO2 20  19 - 32 mEq/L    Glucose, Bld 71  70 - 99 mg/dL    BUN 23  6 - 23 mg/dL    Creatinine, Ser 1.61 (*) 0.50 - 1.10 mg/dL    Calcium 8.9  8.4 - 09.6 mg/dL    GFR calc non Af Amer 6 (*) >90 mL/min    GFR calc Af Amer 7 (*) >90 mL/min   CBC     Status: Abnormal   Collection Time   04/09/12  5:00 AM      Component Value Range Comment   WBC 2.3 (*) 4.0 - 10.5 K/uL    RBC 3.32 (*) 3.87 - 5.11 MIL/uL    Hemoglobin 10.8 (*) 12.0 - 15.0 g/dL    HCT 04.5 (*) 40.9 - 46.0 %    MCV 97.9  78.0 - 100.0 fL    MCH 32.5  26.0 - 34.0 pg    MCHC 33.2  30.0 - 36.0 g/dL    RDW 81.1  91.4 - 78.2 %    Platelets 62 (*) 150 - 400 K/uL CONSISTENT WITH PREVIOUS RESULT   Constitutional: positive for fatigue, negative for chills, fevers and sweats Ears, nose, mouth, throat, and face: positive for sore throat; negative for hearing loss, hoarseness and nasal congestion Respiratory: positive for cough and dyspnea on exertion; negative for sputum Cardiovascular: negative for chest pain, orthopnea and palpitations Gastrointestinal: positive for nausea and epigastric pain, negative for change in bowel habits, no further vomiting Genitourinary:negative, anuric Musculoskeletal:negative for arthralgias, back pain, myalgias and neck pain Neurological: negative for dizziness, gait problems, headaches and speech problems  Physical Exam: Filed Vitals:   04/09/12 0911  BP: 84/42  Pulse:   Temp:   Resp:      General appearance: alert, cooperative and no distress Head: Normocephalic, without obvious abnormality, atraumatic Neck: no  adenopathy, no carotid bruit, no JVD and supple, symmetrical, trachea midline Resp: Clear to auscultation without rales, rhonchi, or wheezes Cardio: regular rate and rhythm, S1, S2 normal, no murmur, click, rub or gallop GI: + BS, soft with upper abdominal tenderness to palpation Extremities: extremities normal, atraumatic, no cyanosis or edema Neurologic: Grossly normal Dialysis Access: AVG @ LFA with + bruit   Assessment/Plan: 1. Dyspnea - Chest x-ray negative, but CT with patchy bilateral lower lobe opacities (L > R), negative for PE; on IV Vancomycin, Maxipime, and Levaquin. 2. Abdominal pain - Elevated lipase, s/p cholecystectomy on 12/26/11; CT showed cirrhosis with gastroesophageal  varices; Hx GI bleed s/p EGD 2/22 with esophageal varices; started IV Protonix. 3. ESRD -  HD on MWF @ [facility]; K 3.8.  HD today. 4. Hypertension/volume  - BP usually runs low (currently 89/49), often 80s-90s during HD; current wt 78.3 kg with EDW 75 kg. 5. Anemia  - Hgb 10.8, no outpatient Epogen or Fe. 6. Metabolic bone disease -  Ca 8.9 (9.5 corrected), last P 4.9 on 12/18, Hectorol 2 mcg, Phoslo 2 with meals. 7. Nutrition - Alb 3.3, high protein renal diet. 8. Hx thrombocytopenia - plts 62K.  9. Hypothyroidism - on Synthroid.   LYLES,CHARLES 04/09/2012, 10:08 AM  I have seen and examined this patient and agree with plan per Gerome Apley.  Though she initially presented with SOB, she is not on O2 and shows no signs of dyspnea.  Lungs are clear and thus doubt pulm edema as cause for her SOB.  Will plan to HD today.  Chronic low BP Rose-Marie Hickling T,MD 04/09/2012 12:21 PM Attending Nephrologist: Primitivo Gauze, MD

## 2012-04-10 DIAGNOSIS — K859 Acute pancreatitis without necrosis or infection, unspecified: Secondary | ICD-10-CM

## 2012-04-10 HISTORY — DX: Acute pancreatitis without necrosis or infection, unspecified: K85.90

## 2012-04-10 LAB — RENAL FUNCTION PANEL
CO2: 25 mEq/L (ref 19–32)
Calcium: 8.7 mg/dL (ref 8.4–10.5)
Chloride: 94 mEq/L — ABNORMAL LOW (ref 96–112)
GFR calc Af Amer: 10 mL/min — ABNORMAL LOW (ref >90)
GFR calc non Af Amer: 9 mL/min — ABNORMAL LOW (ref >90)
Sodium: 130 mEq/L — ABNORMAL LOW (ref 135–145)

## 2012-04-10 LAB — CBC
MCH: 32.7 pg (ref 26.0–34.0)
Platelets: 53 10*3/uL — ABNORMAL LOW (ref 150–400)
RBC: 3.15 MIL/uL — ABNORMAL LOW (ref 3.87–5.11)
WBC: 2 10*3/uL — ABNORMAL LOW (ref 4.0–10.5)

## 2012-04-10 MED ORDER — VANCOMYCIN HCL 1000 MG IV SOLR: 750.0000 mg | INTRAVENOUS | Status: DC

## 2012-04-10 MED ORDER — DEXTROSE 5 % IV SOLN: 2.0000 g | INTRAVENOUS | Status: DC

## 2012-04-10 MED ORDER — SODIUM CHLORIDE 0.9 % IV BOLUS (SEPSIS)
500.0000 mL | Freq: Once | INTRAVENOUS | Status: AC
  Administered 2012-04-10: 500 mL via INTRAVENOUS

## 2012-04-10 NOTE — Progress Notes (Signed)
S: feels better  No sob  Mild RUQ pain O:BP 90/54  Pulse 76  Temp 97.8 F (36.6 C) (Oral)  Resp 16  Ht 4\' 10"  (1.473 m)  Wt 81.8 kg (180 lb 5.4 oz)  BMI 37.69 kg/m2  SpO2 97%  Intake/Output Summary (Last 24 hours) at 04/10/12 1019 Last data filed at 04/10/12 0551  Gross per 24 hour  Intake     50 ml  Output   1905 ml  Net  -1855 ml   Weight change: 3.5 kg (7 lb 11.5 oz) ZOX:WRUEA and alert CVS:RRR Resp:clear Abd:+ BS ND mild RUQ tenderness wo guarding or rebound Ext: Lt AVG + bruit NEURO:CNI MSI Ox3      . calcium acetate  1,334 mg Oral TID WC  . ceFEPime (MAXIPIME) IV  2 g Intravenous Q M,W,F-HD  . doxercalciferol  2 mcg Intravenous Q M,W,F-HD  . levofloxacin (LEVAQUIN) IV  500 mg Intravenous Q48H  . levothyroxine  100 mcg Oral QAC breakfast  . multivitamin  1 tablet Oral QHS  . pantoprazole (PROTONIX) IV  40 mg Intravenous Q24H  . sodium chloride  3 mL Intravenous Q12H  . sodium chloride  3 mL Intravenous Q12H  . vancomycin  750 mg Intravenous Once  . vancomycin  750 mg Intravenous Q M,W,F-HD   Ct Angio Chest Pe W/cm &/or Wo Cm  04/08/2012  *RADIOLOGY REPORT*  Clinical Data:  Shortness of breath, upper abdominal pain, on dialysis  CT ANGIOGRAPHY CHEST CT ABDOMEN AND PELVIS WITH CONTRAST  Technique:  Multidetector CT imaging of the chest was performed using the standard protocol during bolus administration of intravenous contrast.  Multiplanar CT image reconstructions including MIPs were obtained to evaluate the vascular anatomy. Multidetector CT imaging of the abdomen and pelvis was performed using the standard protocol during bolus administration of intravenous contrast.  Contrast: OMNIPAQUE IOHEXOL 350 MG/ML SOLN  Comparison:  Chest radiograph dated 04/08/2012.  CTA CHEST  Findings:  No evidence of pulmonary embolism.  Patchy left lower lobe opacity, suspicious for pneumonia.  Mild patchy right basilar opacity, atelectasis versus pneumonia. No pleural effusion or  pneumothorax.  The heart is mildly enlarged.  No pericardial effusion.  Mild atherosclerotic calcifications of the aortic arch.  Small mediastinal lymph nodes which do not meet pathologic CT size criteria.  No suspicious hilar or axillary lymphadenopathy.  Lower esophagus is mildly thick-walled, nonspecific.  Mild degenerative changes of the visualized thoracolumbar spine. Renal osteodystrophy.  Review of the MIP images confirms the above findings.  IMPRESSION: No evidence of pulmonary embolism.  Patchy bilateral lower lobe opacities, left greater than right, suspicious for pneumonia.  Mild lower esophageal wall thickening, correlate for esophagitis.  CT ABDOMEN AND PELVIS  Findings: Cirrhotic configuration.  No suspicious/thin enhancing hepatic lesions.  Spleen is normal in size, measuring 11.7 cm in craniocaudal dimension.  Gastroesophageal varices.  Portal vein remains patent.  No peripancreatic inflammatory changes by CT.  Adrenal glands are unremarkable.  Status post cholecystectomy.  No intrahepatic ductal dilatation. Common duct measures 14 mm (series 13/image 30) but tapers at the ampulla.  Bilateral renal atrophy.  10 mm left renal cyst (series 18/image 12).  No hydronephrosis.  No evidence of bowel obstruction.  Normal appendix.  Atherosclerotic calcifications of the abdominal aorta and branch vessels.  No abdominopelvic ascites.  No suspicious abdominopelvic lymphadenopathy.  Uterus and right ovary are unremarkable.  Suspected left ovarian cyst (series 13/image 61), although incompletely characterized.  Bladder is decompressed.  Degenerative changes of  the lumbar spine.  Renal osteodystrophy.  Review of the MIP images confirms the above findings.  IMPRESSION: Cirrhosis with gastroesophageal varices.  Portal vein remains patent.  Status post cholecystectomy.  Common duct measures 14 mm but tapers at the ampulla.  In the setting of normal LFTs, this is likely postsurgical.  No peripancreatic inflammatory  changes by CT.  Suspected left ovarian cyst, incompletely characterized.  Given postmenopausal status, consider nonemergent pelvic ultrasound for further evaluation.   Original Report Authenticated By: Charline Bills, M.D.    Ct Abdomen Pelvis W Contrast  04/08/2012  *RADIOLOGY REPORT*  Clinical Data:  Shortness of breath, upper abdominal pain, on dialysis  CT ANGIOGRAPHY CHEST CT ABDOMEN AND PELVIS WITH CONTRAST  Technique:  Multidetector CT imaging of the chest was performed using the standard protocol during bolus administration of intravenous contrast.  Multiplanar CT image reconstructions including MIPs were obtained to evaluate the vascular anatomy. Multidetector CT imaging of the abdomen and pelvis was performed using the standard protocol during bolus administration of intravenous contrast.  Contrast: OMNIPAQUE IOHEXOL 350 MG/ML SOLN  Comparison:  Chest radiograph dated 04/08/2012.  CTA CHEST  Findings:  No evidence of pulmonary embolism.  Patchy left lower lobe opacity, suspicious for pneumonia.  Mild patchy right basilar opacity, atelectasis versus pneumonia. No pleural effusion or pneumothorax.  The heart is mildly enlarged.  No pericardial effusion.  Mild atherosclerotic calcifications of the aortic arch.  Small mediastinal lymph nodes which do not meet pathologic CT size criteria.  No suspicious hilar or axillary lymphadenopathy.  Lower esophagus is mildly thick-walled, nonspecific.  Mild degenerative changes of the visualized thoracolumbar spine. Renal osteodystrophy.  Review of the MIP images confirms the above findings.  IMPRESSION: No evidence of pulmonary embolism.  Patchy bilateral lower lobe opacities, left greater than right, suspicious for pneumonia.  Mild lower esophageal wall thickening, correlate for esophagitis.  CT ABDOMEN AND PELVIS  Findings: Cirrhotic configuration.  No suspicious/thin enhancing hepatic lesions.  Spleen is normal in size, measuring 11.7 cm in craniocaudal  dimension.  Gastroesophageal varices.  Portal vein remains patent.  No peripancreatic inflammatory changes by CT.  Adrenal glands are unremarkable.  Status post cholecystectomy.  No intrahepatic ductal dilatation. Common duct measures 14 mm (series 13/image 30) but tapers at the ampulla.  Bilateral renal atrophy.  10 mm left renal cyst (series 18/image 12).  No hydronephrosis.  No evidence of bowel obstruction.  Normal appendix.  Atherosclerotic calcifications of the abdominal aorta and branch vessels.  No abdominopelvic ascites.  No suspicious abdominopelvic lymphadenopathy.  Uterus and right ovary are unremarkable.  Suspected left ovarian cyst (series 13/image 61), although incompletely characterized.  Bladder is decompressed.  Degenerative changes of the lumbar spine.  Renal osteodystrophy.  Review of the MIP images confirms the above findings.  IMPRESSION: Cirrhosis with gastroesophageal varices.  Portal vein remains patent.  Status post cholecystectomy.  Common duct measures 14 mm but tapers at the ampulla.  In the setting of normal LFTs, this is likely postsurgical.  No peripancreatic inflammatory changes by CT.  Suspected left ovarian cyst, incompletely characterized.  Given postmenopausal status, consider nonemergent pelvic ultrasound for further evaluation.   Original Report Authenticated By: Charline Bills, M.D.    Dg Chest Portable 1 View  04/08/2012  *RADIOLOGY REPORT*  Clinical Data: Lower chest pain, shortness of breath, diabetes  PORTABLE CHEST - 1 VIEW  Comparison: Portable exam 1711 hours compared to 09/22/2011  Findings: Enlargement of cardiac silhouette. Calcified tortuous aorta. Pulmonary vascularity normal. Peribronchial  thickening without infiltrate, pleural effusion or pneumothorax. Bones demineralized. Prior left shoulder replacement.  IMPRESSION: Mild enlargement of cardiac silhouette. Bronchitic changes without acute infiltrate.   Original Report Authenticated By: Ulyses Southward, M.D.     BMET    Component Value Date/Time   NA 130* 04/10/2012 0500   K 4.4 04/10/2012 0500   CL 94* 04/10/2012 0500   CO2 25 04/10/2012 0500   GLUCOSE 77 04/10/2012 0500   BUN 12 04/10/2012 0500   CREATININE 4.66* 04/10/2012 0500   CALCIUM 8.7 04/10/2012 0500   GFRNONAA 9* 04/10/2012 0500   GFRAA 10* 04/10/2012 0500   CBC    Component Value Date/Time   WBC 2.0* 04/10/2012 0450   RBC 3.15* 04/10/2012 0450   HGB 10.3* 04/10/2012 0450   HCT 30.6* 04/10/2012 0450   PLT 53* 04/10/2012 0450   MCV 97.1 04/10/2012 0450   MCH 32.7 04/10/2012 0450   MCHC 33.7 04/10/2012 0450   RDW 14.5 04/10/2012 0450   LYMPHSABS 1.2 04/08/2012 1722   MONOABS 0.4 04/08/2012 1722   EOSABS 0.1 04/08/2012 1722   BASOSABS 0.0 04/08/2012 1722     Assessment:  1. Dyspnea, resolved 2. Abd pain, improved  Neg CT scan.  Lipase sl increased ? pancreatitis 3. Anemia on EPO 4. Sec HPTH on Vit D 5. ESRD  Plan: 1. Next HD Friday   Samantha [name]

## 2012-04-10 NOTE — Progress Notes (Signed)
[facility] PROGRESS NOTE  Samantha [name] ZOX:[id number] DOB: Feb 27, 1946 DOA: 04/08/2012 PCP: Dorrene German, MD  Assessment/Plan:  Healthcare associated pneumonia  Patient presented with shortness of breath likely related to pneumonia which was evident on chest x-ray. Patient placed on IV vancomycin, cefepime and Levaquin. followup with blood cultures and sputum culture. urine for Legionella antigen . -clinically improved. No SOB today. Afebrile.  Abdominal pain  Patient complained of epigastric and RUQ discomfort with elevated lipase. She recently had a cholecystectomy. Abdominal CT scan unremarkable for pancreatitis or CBD stone. Continue  patient on clear liquids and monitor. Follow up lipase. -Added Protonix  - serial abdominal exam   End-stage renal disease  Patient on hemodialysis Sunday Tuesday and Fridays and received HD yesterday.  Hypotension  Appears to have chronically low BP and improves with IV NS. Received 1 L NS yesterday and 500 cc overnight . Currently stable  Chronic leukopenia and thrombocytopenia  Continue to monitor   Hypothyroidism  Continue Synthroid   Code Status: Full  Family Communication: None at bedside  Disposition Plan: Home once stable    Consultants:  [place] [facility]  Procedures:  Scheduled hemodialysis   HPI/Subjective: Feels much better today. Still c/o RUQ pain. Had a drop in her BP to 70s overnight and given 500 cc IV NS. BP better this am.   Objective: Filed Vitals:   04/09/12 1740 04/10/12 0551 04/10/12 0641 04/10/12 0927  BP: 91/49 72/40 74/46 90/54  Pulse: 82 78 76 76  Temp: 98 F (36.7 C) 97.8 F (36.6 C)    TempSrc: Oral Oral    Resp: 15 16    Height:      Weight:      SpO2: 100% 97%      Intake/Output Summary (Last 24 hours) at 04/10/12 1025 Last data filed at 04/10/12 0551  Gross per 24 hour  Intake     50  ml  Output   1905 ml  Net  -1855 ml   Filed Weights   04/09/12 0051 04/09/12 1321  Weight:  78.3 kg (172 lb 9.9 oz) 81.8 kg (180 lb 5.4 oz)    Exam:  General: Elderly female in no acute distress, hard of hearing  HEENT: No pallor, moist oral mucosa  Cardiovascular: Normal S1 and S2, no murmurs  Respiratory: Equal air entry bilaterally, no crackles, no wheezing or rhonchi  Abdomen: Soft, mildly tender on palpation over RUQ area, nondistended, bowel sounds present          Extremities: Warm, trace edema            CNS: AAO x3   Data Reviewed: Basic Metabolic Panel:  Lab 04/10/12 8119 04/09/12 0500 04/08/12 1722  NA 130* 122* 129*  K 4.4 3.8 3.5  CL 94* 84* 85*  CO2 25 20 26   GLUCOSE 77 71 93  BUN 12 23 18   CREATININE 4.66* 6.71* 6.38*  CALCIUM 8.7 8.9 10.8*  MG -- -- --  PHOS 2.6 -- --   Liver Function Tests:  Lab 04/10/12 0500 04/08/12 1722  AST -- 30  ALT -- 15  ALKPHOS -- 88  BILITOT -- 0.8  PROT -- 8.3  ALBUMIN 2.4* 3.3*    Lab 04/08/12 1723  LIPASE 229*  AMYLASE --   No results found for this basename: AMMONIA:5 in the last 168 hours CBC:  Lab 04/10/12 0450 04/09/12 0500 04/08/12 1722  WBC 2.0* 2.3* 2.9*  NEUTROABS -- -- 1.1*  HGB 10.3* 10.8* 12.6  HCT 30.6*  32.5* 38.1  MCV 97.1 97.9 98.2  PLT 53* 62* 87*   Cardiac Enzymes:  Lab 04/09/12 0225  CKTOTAL --  CKMB --  CKMBINDEX --  TROPONINI <0.30   BNP (last 3 results)  Basename 04/09/12 0225  PROBNP 3863.0*   CBG: No results found for this basename: GLUCAP:5 in the last 168 hours  Recent Results (from the past 240 hour(s))  MRSA PCR SCREENING     Status: Normal   Collection Time   04/09/12  3:56 AM      Component Value Range Status Comment   MRSA by PCR NEGATIVE  NEGATIVE Final      Studies: Ct Angio Chest Pe W/cm &/or Wo Cm  04/08/2012  *RADIOLOGY REPORT*  Clinical Data:  Shortness of breath, upper abdominal pain, on dialysis  CT ANGIOGRAPHY CHEST CT ABDOMEN AND PELVIS WITH CONTRAST  Technique:  Multidetector CT imaging of the chest was performed using the standard protocol  during bolus administration of intravenous contrast.  Multiplanar CT image reconstructions including MIPs were obtained to evaluate the vascular anatomy. Multidetector CT imaging of the abdomen and pelvis was performed using the standard protocol during bolus administration of intravenous contrast.  Contrast: OMNIPAQUE IOHEXOL 350 MG/ML SOLN  Comparison:  Chest radiograph dated 04/08/2012.  CTA CHEST  Findings:  No evidence of pulmonary embolism.  Patchy left lower lobe opacity, suspicious for pneumonia.  Mild patchy right basilar opacity, atelectasis versus pneumonia. No pleural effusion or pneumothorax.  The heart is mildly enlarged.  No pericardial effusion.  Mild atherosclerotic calcifications of the aortic arch.  Small mediastinal lymph nodes which do not meet pathologic CT size criteria.  No suspicious hilar or axillary lymphadenopathy.  Lower esophagus is mildly thick-walled, nonspecific.  Mild degenerative changes of the visualized thoracolumbar spine. Renal osteodystrophy.  Review of the MIP images confirms the above findings.  IMPRESSION: No evidence of pulmonary embolism.  Patchy bilateral lower lobe opacities, left greater than right, suspicious for pneumonia.  Mild lower esophageal wall thickening, correlate for esophagitis.  CT ABDOMEN AND PELVIS  Findings: Cirrhotic configuration.  No suspicious/thin enhancing hepatic lesions.  Spleen is normal in size, measuring 11.7 cm in craniocaudal dimension.  Gastroesophageal varices.  Portal vein remains patent.  No peripancreatic inflammatory changes by CT.  Adrenal glands are unremarkable.  Status post cholecystectomy.  No intrahepatic ductal dilatation. Common duct measures 14 mm (series 13/image 30) but tapers at the ampulla.  Bilateral renal atrophy.  10 mm left renal cyst (series 18/image 12).  No hydronephrosis.  No evidence of bowel obstruction.  Normal appendix.  Atherosclerotic calcifications of the abdominal aorta and branch vessels.  No  abdominopelvic ascites.  No suspicious abdominopelvic lymphadenopathy.  Uterus and right ovary are unremarkable.  Suspected left ovarian cyst (series 13/image 61), although incompletely characterized.  Bladder is decompressed.  Degenerative changes of the lumbar spine.  Renal osteodystrophy.  Review of the MIP images confirms the above findings.  IMPRESSION: Cirrhosis with gastroesophageal varices.  Portal vein remains patent.  Status post cholecystectomy.  Common duct measures 14 mm but tapers at the ampulla.  In the setting of normal LFTs, this is likely postsurgical.  No peripancreatic inflammatory changes by CT.  Suspected left ovarian cyst, incompletely characterized.  Given postmenopausal status, consider nonemergent pelvic ultrasound for further evaluation.   Original Report Authenticated By: Charline Bills, M.D.    Ct Abdomen Pelvis W Contrast  04/08/2012  *RADIOLOGY REPORT*  Clinical Data:  Shortness of breath, upper abdominal pain, on dialysis  CT ANGIOGRAPHY CHEST CT ABDOMEN AND PELVIS WITH CONTRAST  Technique:  Multidetector CT imaging of the chest was performed using the standard protocol during bolus administration of intravenous contrast.  Multiplanar CT image reconstructions including MIPs were obtained to evaluate the vascular anatomy. Multidetector CT imaging of the abdomen and pelvis was performed using the standard protocol during bolus administration of intravenous contrast.  Contrast: OMNIPAQUE IOHEXOL 350 MG/ML SOLN  Comparison:  Chest radiograph dated 04/08/2012.  CTA CHEST  Findings:  No evidence of pulmonary embolism.  Patchy left lower lobe opacity, suspicious for pneumonia.  Mild patchy right basilar opacity, atelectasis versus pneumonia. No pleural effusion or pneumothorax.  The heart is mildly enlarged.  No pericardial effusion.  Mild atherosclerotic calcifications of the aortic arch.  Small mediastinal lymph nodes which do not meet pathologic CT size criteria.  No  suspicious hilar or axillary lymphadenopathy.  Lower esophagus is mildly thick-walled, nonspecific.  Mild degenerative changes of the visualized thoracolumbar spine. Renal osteodystrophy.  Review of the MIP images confirms the above findings.  IMPRESSION: No evidence of pulmonary embolism.  Patchy bilateral lower lobe opacities, left greater than right, suspicious for pneumonia.  Mild lower esophageal wall thickening, correlate for esophagitis.  CT ABDOMEN AND PELVIS  Findings: Cirrhotic configuration.  No suspicious/thin enhancing hepatic lesions.  Spleen is normal in size, measuring 11.7 cm in craniocaudal dimension.  Gastroesophageal varices.  Portal vein remains patent.  No peripancreatic inflammatory changes by CT.  Adrenal glands are unremarkable.  Status post cholecystectomy.  No intrahepatic ductal dilatation. Common duct measures 14 mm (series 13/image 30) but tapers at the ampulla.  Bilateral renal atrophy.  10 mm left renal cyst (series 18/image 12).  No hydronephrosis.  No evidence of bowel obstruction.  Normal appendix.  Atherosclerotic calcifications of the abdominal aorta and branch vessels.  No abdominopelvic ascites.  No suspicious abdominopelvic lymphadenopathy.  Uterus and right ovary are unremarkable.  Suspected left ovarian cyst (series 13/image 61), although incompletely characterized.  Bladder is decompressed.  Degenerative changes of the lumbar spine.  Renal osteodystrophy.  Review of the MIP images confirms the above findings.  IMPRESSION: Cirrhosis with gastroesophageal varices.  Portal vein remains patent.  Status post cholecystectomy.  Common duct measures 14 mm but tapers at the ampulla.  In the setting of normal LFTs, this is likely postsurgical.  No peripancreatic inflammatory changes by CT.  Suspected left ovarian cyst, incompletely characterized.  Given postmenopausal status, consider nonemergent pelvic ultrasound for further evaluation.   Original Report Authenticated By: Charline Bills, M.D.    Dg Chest Portable 1 View  04/08/2012  *RADIOLOGY REPORT*  Clinical Data: Lower chest pain, shortness of breath, diabetes  PORTABLE CHEST - 1 VIEW  Comparison: Portable exam 1711 hours compared to 09/22/2011  Findings: Enlargement of cardiac silhouette. Calcified tortuous aorta. Pulmonary vascularity normal. Peribronchial thickening without infiltrate, pleural effusion or pneumothorax. Bones demineralized. Prior left shoulder replacement.  IMPRESSION: Mild enlargement of cardiac silhouette. Bronchitic changes without acute infiltrate.   Original Report Authenticated By: Ulyses Southward, M.D.     Scheduled Meds:   . calcium acetate  1,334 mg Oral TID WC  . ceFEPime (MAXIPIME) IV  2 g Intravenous Q M,W,F-HD  . doxercalciferol  2 mcg Intravenous Q M,W,F-HD  . levofloxacin (LEVAQUIN) IV  500 mg Intravenous Q48H  . levothyroxine  100 mcg Oral QAC breakfast  . multivitamin  1 tablet Oral QHS  . pantoprazole (PROTONIX) IV  40 mg Intravenous Q24H  . sodium chloride  3 mL Intravenous Q12H  . sodium chloride  3 mL Intravenous Q12H  . vancomycin  750 mg Intravenous Once  . vancomycin  750 mg Intravenous Q M,W,F-HD   Continuous Infusions:     Time spent: 25 minutes    Samantha [name]  [facility] Pager [phone] If 8PM-8AM, please contact night-coverage at [email or web address], password [facility] 04/10/2012, 10:25 AM  LOS: 2 days

## 2012-04-10 NOTE — Progress Notes (Signed)
NP Tama Gander returned call new order received and carried out for 500cc normal saline bolus.

## 2012-04-10 NOTE — Progress Notes (Signed)
Patient blood pressure low 74/46 asymptomatic has chronically low blood pressures per history.Call placed to Md .Will continue to monitor.

## 2012-04-11 DIAGNOSIS — R0602 Shortness of breath: Secondary | ICD-10-CM

## 2012-04-11 LAB — RENAL FUNCTION PANEL
Albumin: 2.3 g/dL — ABNORMAL LOW (ref 3.5–5.2)
BUN: 18 mg/dL (ref 6–23)
Chloride: 87 mEq/L — ABNORMAL LOW (ref 96–112)
GFR calc Af Amer: 7 mL/min — ABNORMAL LOW (ref >90)
Glucose, Bld: 71 mg/dL (ref 70–99)
Phosphorus: 2.8 mg/dL (ref 2.3–4.6)
Potassium: 4.2 mEq/L (ref 3.5–5.1)
Sodium: 121 mEq/L — ABNORMAL LOW (ref 135–145)

## 2012-04-11 LAB — CBC
MCV: 95.1 fL (ref 78.0–100.0)
Platelets: 48 10*3/uL — ABNORMAL LOW (ref 150–400)
RBC: 3.26 MIL/uL — ABNORMAL LOW (ref 3.87–5.11)
RDW: 14.2 % (ref 11.5–15.5)
WBC: 2 10*3/uL — ABNORMAL LOW (ref 4.0–10.5)

## 2012-04-11 MED ORDER — HEPARIN SODIUM (PORCINE) 1000 UNIT/ML DIALYSIS: 1000.0000 [IU] | INTRAMUSCULAR | Status: DC | PRN

## 2012-04-11 MED ORDER — PENTAFLUOROPROP-TETRAFLUOROETH EX AERO: 1.0000 "application " | INHALATION_SPRAY | CUTANEOUS | Status: DC | PRN

## 2012-04-11 MED ORDER — ZOLPIDEM TARTRATE 5 MG PO TABS
5.0000 mg | ORAL_TABLET | Freq: Once | ORAL | Status: AC
  Administered 2012-04-11: 5 mg via ORAL
  Filled 2012-04-11: qty 1

## 2012-04-11 MED ORDER — VANCOMYCIN HCL 1000 MG IV SOLR
750.0000 mg | INTRAVENOUS | Status: DC
  Administered 2012-04-12: 750 mg via INTRAVENOUS
  Filled 2012-04-11: qty 750

## 2012-04-11 MED ORDER — NEPRO/CARBSTEADY PO LIQD: 237.0000 mL | ORAL | Status: DC | PRN

## 2012-04-11 MED ORDER — LIDOCAINE HCL (PF) 1 % IJ SOLN: 5.0000 mL | INTRAMUSCULAR | Status: DC | PRN

## 2012-04-11 MED ORDER — DEXTROSE 5 % IV SOLN
2.0000 g | INTRAVENOUS | Status: DC
  Filled 2012-04-11 (×2): qty 2

## 2012-04-11 MED ORDER — SODIUM CHLORIDE 0.9 % IV SOLN: 100.0000 mL | INTRAVENOUS | Status: DC | PRN

## 2012-04-11 MED ORDER — HEPARIN SODIUM (PORCINE) 1000 UNIT/ML DIALYSIS
20.0000 [IU]/kg | INTRAMUSCULAR | Status: DC | PRN
  Administered 2012-04-12: 1800 [IU] via INTRAVENOUS_CENTRAL

## 2012-04-11 MED ORDER — LIDOCAINE-PRILOCAINE 2.5-2.5 % EX CREA: 1.0000 "application " | TOPICAL_CREAM | CUTANEOUS | Status: DC | PRN

## 2012-04-11 MED ORDER — SODIUM CHLORIDE 0.9 % IV BOLUS (SEPSIS)
500.0000 mL | Freq: Once | INTRAVENOUS | Status: AC
  Administered 2012-04-11: 500 mL via INTRAVENOUS

## 2012-04-11 MED ORDER — ALTEPLASE 2 MG IJ SOLR: 2.0000 mg | Freq: Once | INTRAMUSCULAR | Status: AC | PRN

## 2012-04-11 NOTE — Progress Notes (Signed)
[facility] PROGRESS NOTE  Samantha [name]:[id number] DOB: [birth year] DOA: 04/08/2012 PCP: Dorrene German, MD  Assessment/Plan:  Healthcare associated pneumonia  Patient presented with shortness of breath likely related to pneumonia which was evident on chest x-ray. Patient placed on IV vancomycin, cefepime and Levaquin. We'll narrow down  antibiotic on discharge -clinically improved. Remains afebrile  Abdominal pain  Patient complained of epigastric and RUQ discomfort with elevated lipase. She recently had a cholecystectomy. Abdominal CT scan unremarkable for pancreatitis or CBD stone. Lipase level improving. -Patient had an episode of vomiting this morning however her epigastric pain has resolved. We'll advance diet to full liquid. -Continue Protonix     End-stage renal disease  Patient on hemodialysis Sunday Tuesday and Fridays  Renal team on board Hypotension  Appears to have chronically low BP and improves with IV NS. Hasn't requiring short normal saline boluses. Currently stable  Chronic leukopenia and thrombocytopenia  Continue to monitor   Hypothyroidism  Continue Synthroid   Code Status: Full  Family Communication: None at bedside  Disposition Plan: Home once stable  Consultants:  [place] [facility]  Procedures:  Scheduled hemodialysis   HPI/Subjective: Patient had an episode of vomiting today feels that she was having difficulty on an empty stomach. Denies any epigastric pain at this time. Her pressure has been stable  Objective: Filed Vitals:   04/10/12 2300 04/11/12 0526 04/11/12 0607 04/11/12 0950  BP:  73/41 82/50 99/57   Pulse:  86  80  Temp:  98.4 F (36.9 C)  98.2 F (36.8 C)  TempSrc:  Oral  Oral  Resp:  16  20  Height:      Weight: 87.5 kg (192 lb 14.4 oz)     SpO2:  99%  99%    Intake/Output Summary (Last 24 hours) at 04/11/12 1357 Last data filed at 04/11/12 0200  Gross per 24 hour  Intake    220 ml  Output      5 ml  Net    215  ml   Filed Weights   04/09/12 0051 04/09/12 1321 04/10/12 2300  Weight: 78.3 kg (172 lb 9.9 oz) 81.8 kg (180 lb 5.4 oz) 87.5 kg (192 lb 14.4 oz)    Exam:   General:  Elderly female in no acute HEENT: No pallor, moist oral mucosa  Cardiovascular: Normal S1 and S2, no murmurs  Respiratory: Equal air entry bilaterally, no crackles, no wheezing or rhonchi  Abdomen: Soft, nontender, nondistended, bowel sounds present        Extremities: Warm, no edema         CNS: AAO x3   Data Reviewed: Basic Metabolic Panel:  Lab 04/11/12 4259 04/10/12 0500 04/09/12 0500 04/08/12 1722  NA 121* 130* 122* 129*  K 4.2 4.4 3.8 3.5  CL 87* 94* 84* 85*  CO2 22 25 20 26   GLUCOSE 71 77 71 93  BUN 18 12 23 18   CREATININE 6.41* 4.66* 6.71* 6.38*  CALCIUM 8.8 8.7 8.9 10.8*  MG -- -- -- --  PHOS 2.8 2.6 -- --   Liver Function Tests:  Lab 04/11/12 0500 04/10/12 0500 04/08/12 1722  AST -- -- 30  ALT -- -- 15  ALKPHOS -- -- 88  BILITOT -- -- 0.8  PROT -- -- 8.3  ALBUMIN 2.3* 2.4* 3.3*    Lab 04/11/12 0500 04/10/12 0500 04/08/12 1723  LIPASE 124* 154* 229*  AMYLASE -- -- --   No results found for this basename: AMMONIA:5 in the last 168  hours CBC:  Lab 04/11/12 0500 04/10/12 0450 04/09/12 0500 04/08/12 1722  WBC 2.0* 2.0* 2.3* 2.9*  NEUTROABS -- -- -- 1.1*  HGB 10.6* 10.3* 10.8* 12.6  HCT 31.0* 30.6* 32.5* 38.1  MCV 95.1 97.1 97.9 98.2  PLT 48* 53* 62* 87*   Cardiac Enzymes:  Lab 04/09/12 0225  CKTOTAL --  CKMB --  CKMBINDEX --  TROPONINI <0.30   BNP (last 3 results)  Basename 04/09/12 0225  PROBNP 3863.0*   CBG: No results found for this basename: GLUCAP:5 in the last 168 hours  Recent Results (from the past 240 hour(s))  MRSA PCR SCREENING     Status: Normal   Collection Time   04/09/12  3:56 AM      Component Value Range Status Comment   MRSA by PCR NEGATIVE  NEGATIVE Final      Studies: No results found.  Scheduled Meds:   . calcium acetate  1,334 mg Oral TID  WC  . ceFEPime (MAXIPIME) IV  2 g Intravenous Q M,W,F-HD  . doxercalciferol  2 mcg Intravenous Q M,W,F-HD  . levofloxacin (LEVAQUIN) IV  500 mg Intravenous Q48H  . levothyroxine  100 mcg Oral QAC breakfast  . multivitamin  1 tablet Oral QHS  . pantoprazole (PROTONIX) IV  40 mg Intravenous Q24H  . sodium chloride  3 mL Intravenous Q12H  . sodium chloride  3 mL Intravenous Q12H  . vancomycin  750 mg Intravenous Once  . vancomycin  750 mg Intravenous Q M,W,F-HD   Continuous Infusions:     Time spent: 25 minutes    Johnmichael Melhorn  [facility] Pager [phone]. If 8PM-8AM, please contact night-coverage at [email or web address], password [facility] 04/11/2012, 1:57 PM  LOS: 3 days

## 2012-04-11 NOTE — Progress Notes (Signed)
ANTIBIOTIC CONSULT NOTE - FOLLOW UP  Pharmacy Consult for vancomycin, cefepime, levaquin Indication: pneumonia  No Known Allergies  Patient Measurements: Height: 4\' 10"  (147.3 cm) (estimate, pt couldn't tell me) Weight: 192 lb 14.4 oz (87.5 kg) IBW/kg (Calculated) : 40.9    Vital Signs: Temp: 98.4 F (36.9 C) (12/26 0526) Temp src: Oral (12/26 0526) BP: 82/50 mmHg (12/26 0607) Pulse Rate: 86  (12/26 0526) Intake/Output from previous day: 12/25 0701 - 12/26 0700 In: 700 [P.O.:600; IV Piggyback:100] Out: 5 [Stool:5] Intake/Output from this shift:    Labs:  Basename 04/11/12 0500 04/10/12 0500 04/10/12 0450 04/09/12 0500  WBC 2.0* -- 2.0* 2.3*  HGB 10.6* -- 10.3* 10.8*  PLT 48* -- 53* 62*  LABCREA -- -- -- --  CREATININE 6.41* 4.66* -- 6.71*   Estimated Creatinine Clearance: 7.9 ml/min (by C-G formula based on Cr of 6.41). No results found for this basename: VANCOTROUGH:2,VANCOPEAK:2,VANCORANDOM:2,GENTTROUGH:2,GENTPEAK:2,GENTRANDOM:2,TOBRATROUGH:2,TOBRAPEAK:2,TOBRARND:2,AMIKACINPEAK:2,AMIKACINTROU:2,AMIKACIN:2, in the last 72 hours   Microbiology: Recent Results (from the past 720 hour(s))  MRSA PCR SCREENING     Status: Normal   Collection Time   04/09/12  3:56 AM      Component Value Range Status Comment   MRSA by PCR NEGATIVE  NEGATIVE Final     Anti-infectives     Start     Dose/Rate Route Frequency Ordered Stop   04/12/12 1800   ceFEPIme (MAXIPIME) 2 g in dextrose 5 % 50 mL IVPB        2 g 100 mL/hr over 30 Minutes Intravenous Once per day on Sun Tue Fri 04/10/12 1354     04/12/12 1800   vancomycin (VANCOCIN) 750 mg in sodium chloride 0.9 % 150 mL IVPB        750 mg 150 mL/hr over 60 Minutes Intravenous Once per day on Sun Tue Fri 04/10/12 1354     04/12/12 1200   vancomycin (VANCOCIN) 750 mg in sodium chloride 0.9 % 150 mL IVPB  Status:  Discontinued        750 mg 150 mL/hr over 60 Minutes Intravenous Every M-W-F (Hemodialysis) 04/09/12 1300 04/10/12  1354   04/12/12 1200   ceFEPIme (MAXIPIME) 2 g in dextrose 5 % 50 mL IVPB  Status:  Discontinued        2 g 100 mL/hr over 30 Minutes Intravenous Every M-W-F (Hemodialysis) 04/09/12 1300 04/10/12 1354   04/11/12 0000   levofloxacin (LEVAQUIN) IVPB 500 mg        500 mg 100 mL/hr over 60 Minutes Intravenous Every 48 hours 04/09/12 1304     04/10/12 2000   levofloxacin (LEVAQUIN) IVPB 500 mg  Status:  Discontinued        500 mg 100 mL/hr over 60 Minutes Intravenous Every 48 hours 04/09/12 0105 04/09/12 1304   04/10/12 1200   ceFEPIme (MAXIPIME) 2 g in dextrose 5 % 50 mL IVPB  Status:  Discontinued        2 g 100 mL/hr over 30 Minutes Intravenous Every M-W-F (Hemodialysis) 04/09/12 0105 04/09/12 1300   04/10/12 1200   vancomycin (VANCOCIN) 750 mg in sodium chloride 0.9 % 150 mL IVPB  Status:  Discontinued        750 mg 150 mL/hr over 60 Minutes Intravenous Every M-W-F (Hemodialysis) 04/09/12 0105 04/09/12 1300   04/09/12 1800   ceFEPIme (MAXIPIME) 2 g in dextrose 5 % 50 mL IVPB        2 g 100 mL/hr over 30 Minutes Intravenous  Once 04/09/12 1300 04/09/12  1938   04/09/12 1800   vancomycin (VANCOCIN) 750 mg in sodium chloride 0.9 % 150 mL IVPB        750 mg 150 mL/hr over 60 Minutes Intravenous  Once 04/09/12 1300     04/09/12 0200   levofloxacin (LEVAQUIN) IVPB 750 mg        750 mg 100 mL/hr over 90 Minutes Intravenous  Once 04/09/12 0110 04/09/12 0415   04/09/12 0130   vancomycin (VANCOCIN) 1,750 mg in sodium chloride 0.9 % 500 mL IVPB        1,750 mg 250 mL/hr over 120 Minutes Intravenous  Once 04/09/12 0107 04/09/12 0800   04/09/12 0130   ceFEPIme (MAXIPIME) 2 g in dextrose 5 % 50 mL IVPB        2 g 100 mL/hr over 30 Minutes Intravenous  Once 04/09/12 0110 04/09/12 0314   04/09/12 0115   vancomycin (VANCOCIN) 750 mg in sodium chloride 0.9 % 150 mL IVPB  Status:  Discontinued        750 mg 150 mL/hr over 60 Minutes Intravenous  Once 04/09/12 0105 04/09/12 0107   04/09/12  0100   levofloxacin (LEVAQUIN) IVPB 750 mg  Status:  Discontinued        750 mg 100 mL/hr over 90 Minutes Intravenous  Once 04/08/12 2351 04/09/12 0110   04/08/12 2359   ceFEPIme (MAXIPIME) 2 g in dextrose 5 % 50 mL IVPB  Status:  Discontinued        2 g 100 mL/hr over 30 Minutes Intravenous  Once 04/08/12 2338 04/09/12 0110   04/08/12 2345   vancomycin (VANCOCIN) IVPB 1000 mg/200 mL premix  Status:  Discontinued        1,000 mg 200 mL/hr over 60 Minutes Intravenous  Once 04/08/12 2338 04/09/12 0107          Assessment: Patient is a 66 y.o F on cefepime, levaquin, and vancomycin for PNA.  Patient received HD on 12/24 with plan to resume her back on her regular schedule of MWF with next session on 12/27.  Patient received vancomycin loading dose of 1750mg  on 12/24 pre-HD session.  Vancomycin 750mg  dose ordered to be given after dialysis session on 12/24 but dose was not charted on MAR.  Unsure if this dose was given or not.  Goal of Therapy:  Pre-HD vancomycin level 15-25  Plan:  1) change abx regimen to MWF with HD 2) will check pre-HD level on Friday to determine whether additional vancomycin dose is needed  Tecla Mailloux P 04/11/2012,9:41 AM

## 2012-04-11 NOTE — Progress Notes (Addendum)
Subjective:  Breathing better, epigastric pain improving, currently comfortable.  Objective: Vital signs in last 24 hours: Temp:  [97.4 F (36.3 C)-98.4 F (36.9 C)] 98.4 F (36.9 C) (12/26 0526) Pulse Rate:  [68-87] 86  (12/26 0526) Resp:  [16-18] 16  (12/26 0526) BP: (73-96)/(41-58) 82/50 mmHg (12/26 0607) SpO2:  [95 %-100 %] 99 % (12/26 0526) Weight:  [87.5 kg (192 lb 14.4 oz)] 87.5 kg (192 lb 14.4 oz) (12/25 2300) Weight change: 5.7 kg (12 lb 9.1 oz)  Intake/Output from previous day: 12/25 0701 - 12/26 0700 In: 700 [P.O.:600; IV Piggyback:100] Out: 5 [Stool:5]   EXAM: General appearance:  Alert, in no apparent distress Resp:  CTA without rales, rhonchi, or wheezes Cardio:  RRR without murmur or rub GI:  + BS, soft and nontender Extremities:  No edema Access:  AVG @ LFA with + bruit  Lab Results:  Basename 04/10/12 0450 04/09/12 0500  WBC 2.0* 2.3*  HGB 10.3* 10.8*  HCT 30.6* 32.5*  PLT 53* 62*   BMET:  Basename 04/10/12 0500 04/09/12 0500 04/08/12 1722  NA 130* 122* --  K 4.4 3.8 --  CL 94* 84* --  CO2 25 20 --  GLUCOSE 77 71 --  BUN 12 23 --  CREATININE 4.66* 6.71* --  CALCIUM 8.7 8.9 --  ALBUMIN 2.4* -- 3.3*   No results found for this basename: PTH:2 in the last 72 hours Iron Studies: No results found for this basename: IRON,TIBC,TRANSFERRIN,FERRITIN in the last 72 hours  Dialysis Orders: Center: [facility] on MWF.  EDW 75 kg HD Bath 2K/2.25Ca Time 3 hrs 45 mins Heparin 2000 U. Access AVG @ LFA BFR 400 DFR A1.5 Hectorol 2 mcg IV/HD Epogen 0 Units IV/HD Venofer 0.   Assessment/Plan: 1. Dyspnea - now resolved; chest x-ray 12/24 negative, but CT with patchy bilateral lower lobe opacities (L > R), negative for PE; on IV Vancomycin, Maxipime, and Levaquin.  2. Abdominal pain - improving; elevated lipase, s/p cholecystectomy on 12/26/11; CT showed cirrhosis with gastroesophageal varices; Hx GI bleed s/p EGD 2/22 with esophageal varices; on IV Protonix.  3. ESRD -  HD on MWF @ [facility]; K 4.4. HD tomorrow.  4. Hypertension/volume - BP usually runs low (currently 82/50), often 80s-90s during HD; wts fluctuating, net UF 12/24 only 1/9 L, secondary to low BP, EDW 75 kg.  5. Anemia - Hgb 10.3, no outpatient Epogen or Fe.  Aranesp  6. Metabolic bone disease - Ca 8.7 (10 corrected), P 2.6, Hectorol 2 mcg, Phoslo 2 with meals.  7. Nutrition - Alb 2.4, high protein renal diet.  8. Hx thrombocytopenia - plts 53K.  9. Hypothyroidism - on Synthroid.    LOS: 3 days   LYLES,CHARLES 04/11/2012,7:30 AM   I have seen and examined this patient and agree with the plan of care. Dialysis Friday  [facility] W 04/11/2012, 9:58 AM

## 2012-04-12 DIAGNOSIS — E871 Hypo-osmolality and hyponatremia: Secondary | ICD-10-CM | POA: Diagnosis present

## 2012-04-12 LAB — RENAL FUNCTION PANEL
CO2: 20 mEq/L (ref 19–32)
Chloride: 82 mEq/L — ABNORMAL LOW (ref 96–112)
GFR calc Af Amer: 5 mL/min — ABNORMAL LOW (ref >90)
GFR calc non Af Amer: 5 mL/min — ABNORMAL LOW (ref >90)
Glucose, Bld: 74 mg/dL (ref 70–99)
Potassium: 4.3 mEq/L (ref 3.5–5.1)
Sodium: 115 mEq/L — CL (ref 135–145)

## 2012-04-12 LAB — CBC
Hemoglobin: 10.2 g/dL — ABNORMAL LOW (ref 12.0–15.0)
Platelets: 45 10*3/uL — ABNORMAL LOW (ref 150–400)
RBC: 3.06 MIL/uL — ABNORMAL LOW (ref 3.87–5.11)
WBC: 2.3 10*3/uL — ABNORMAL LOW (ref 4.0–10.5)

## 2012-04-12 LAB — VANCOMYCIN, RANDOM: Vancomycin Rm: 20.5 ug/mL

## 2012-04-12 MED ORDER — SALINE SPRAY 0.65 % NA SOLN
1.0000 | NASAL | Status: DC | PRN
  Administered 2012-04-12: 1 via NASAL
  Filled 2012-04-12: qty 44

## 2012-04-12 MED ORDER — DOXERCALCIFEROL 4 MCG/2ML IV SOLN
INTRAVENOUS | Status: AC
  Administered 2012-04-12: 2 ug via INTRAVENOUS
  Filled 2012-04-12: qty 2

## 2012-04-12 MED ORDER — SODIUM CHLORIDE 0.9 % IV BOLUS (SEPSIS)
250.0000 mL | Freq: Once | INTRAVENOUS | Status: AC
  Administered 2012-04-12: 250 mL via INTRAVENOUS

## 2012-04-12 NOTE — Progress Notes (Signed)
[facility] PROGRESS NOTE  Samantha [name]:[id number] DOB: 1946/01/02 DOA: 04/08/2012 PCP: Dorrene German, MD    Assessment/Plan:  Healthcare associated pneumonia  Patient presented with shortness of breath likely related to pneumonia which was evident on chest x-ray. Patient placed on IV vancomycin, cefepime and Levaquin. We'll narrow down antibiotic on discharge  -clinically improved. Remains afebrile . Will switch to levaquin monotherapy ( day 5 / 7 of  abx today)  Abdominal pain  Patient complained of epigastric and RUQ discomfort with elevated lipase. She recently had a cholecystectomy. Abdominal CT scan unremarkable for pancreatitis or CBD stone. Lipase level improving.  -still has mild epigastric pain Advance diet to renal. -Continue Protonix   End-stage renal disease  Patient on hemodialysis Sunday Tuesday and Fridays   Hypotension  Appears to have chronically low BP and improves with IV NS. Has been  requiring short normal saline boluses. Currently stable   Hyponatremia  asymptomatic. A 115 this am. HD today. Discussed with renal. Repeat in am and if stable can be discharged home.  Chronic leukopenia and thrombocytopenia  Continue to monitor   Hypothyroidism  Continue Synthroid  Code Status: Full  Family Communication: None at bedside  Disposition Plan: Home once stable  Consultants:  [facility]  Procedures:  Scheduled hemodialysis     HPI/Subjective: Feels better. Still has some epigastric pain  Objective: Filed Vitals:   04/12/12 1000 04/12/12 1029 04/12/12 1120 04/12/12 1144  BP: 119/59 128/64 87/53 95/60  Pulse: 97 99 99   Temp:  98.4 F (36.9 C) 98.9 F (37.2 C)   TempSrc:  Oral    Resp:   18   Height:      Weight:  82.5 kg (181 lb 14.1 oz)    SpO2:  99% 100%     Intake/Output Summary (Last 24 hours) at 04/12/12 1222 Last data filed at 04/12/12 1029  Gross per 24 hour  Intake     60  ml  Output   3904 ml  Net  -3844 ml    Filed Weights   04/11/12 2145 04/12/12 0628 04/12/12 1029  Weight: 87.3 kg (192 lb 7.4 oz) 86.5 kg (190 lb 11.2 oz) 82.5 kg (181 lb 14.1 oz)    Exam:  General: Elderly female in no acute distress HEENT: No pallor, moist oral mucosa  Cardiovascular: Normal S1 and S2, no murmurs  Respiratory: Equal air entry bilaterally, no crackles, no wheezing or rhonchi  Abdomen: Soft, nontender, nondistended, bowel sounds present Extremities: Warm, no edema  CNS: AAO x3   Data Reviewed: Basic Metabolic Panel:  Lab 04/12/12 9562 04/11/12 0500 04/10/12 0500 04/09/12 0500 04/08/12 1722  NA 115* 121* 130* 122* 129*  K 4.3 4.2 4.4 3.8 3.5  CL 82* 87* 94* 84* 85*  CO2 20 22 25 20 26   GLUCOSE 74 71 77 71 93  BUN 24* 18 12 23 18   CREATININE 7.90* 6.41* 4.66* 6.71* 6.38*  CALCIUM 8.9 8.8 8.7 8.9 10.8*  MG -- -- -- -- --  PHOS 3.4 2.8 2.6 -- --   Liver Function Tests:  Lab 04/12/12 0500 04/11/12 0500 04/10/12 0500 04/08/12 1722  AST -- -- -- 30  ALT -- -- -- 15  ALKPHOS -- -- -- 88  BILITOT -- -- -- 0.8  PROT -- -- -- 8.3  ALBUMIN 2.3* 2.3* 2.4* 3.3*    Lab 04/11/12 0500 04/10/12 0500 04/08/12 1723  LIPASE 124* 154* 229*  AMYLASE -- -- --   No results found  for this basename: AMMONIA:5 in the last 168 hours CBC:  Lab 04/12/12 0500 04/11/12 0500 04/10/12 0450 04/09/12 0500 04/08/12 1722  WBC 2.3* 2.0* 2.0* 2.3* 2.9*  NEUTROABS -- -- -- -- 1.1*  HGB 10.2* 10.6* 10.3* 10.8* 12.6  HCT 28.1* 31.0* 30.6* 32.5* 38.1  MCV 91.8 95.1 97.1 97.9 98.2  PLT 45* 48* 53* 62* 87*   Cardiac Enzymes:  Lab 04/09/12 0225  CKTOTAL --  CKMB --  CKMBINDEX --  TROPONINI <0.30   BNP (last 3 results)  Basename 04/09/12 0225  PROBNP 3863.0*   CBG: No results found for this basename: GLUCAP:5 in the last 168 hours  Recent Results (from the past 240 hour(s))  MRSA PCR SCREENING     Status: Normal   Collection Time   04/09/12  3:56 AM      Component Value Range Status Comment   MRSA by PCR  NEGATIVE  NEGATIVE Final      Studies: No results found.  Scheduled Meds:   . calcium acetate  1,334 mg Oral TID WC  . ceFEPime (MAXIPIME) IV  2 g Intravenous Q M,W,F-HD  . doxercalciferol  2 mcg Intravenous Q M,W,F-HD  . levofloxacin (LEVAQUIN) IV  500 mg Intravenous Q48H  . levothyroxine  100 mcg Oral QAC breakfast  . multivitamin  1 tablet Oral QHS  . pantoprazole (PROTONIX) IV  40 mg Intravenous Q24H  . sodium chloride  3 mL Intravenous Q12H  . sodium chloride  3 mL Intravenous Q12H  . vancomycin  750 mg Intravenous Once  . vancomycin  750 mg Intravenous Q M,W,F-HD   Continuous Infusions:     Time spent: 25 minutes    Hadassah Rana  [facility] Pager [phone] If 8PM-8AM, please contact night-coverage at [email or web address], password [facility] 04/12/2012, 12:22 PM  LOS: 4 days

## 2012-04-12 NOTE — Progress Notes (Signed)
Patient c/o of nose being "stocked up",NP,T. Claiborne Billings notified.Will await for nasal spray order.Will continue to monitor. Cloma Rahrig Joselita,RN

## 2012-04-12 NOTE — Progress Notes (Signed)
MD notified of low manual BP 80/40. One time bolus of NS ordered. Will monitor. C.Ahmarion Saraceno, RN.

## 2012-04-12 NOTE — Procedures (Signed)
I have seen and examined this patient and agree with the plan of care. Patient seen on dilaysis . Left AVF Qb 400  Samantha [name] W 04/12/2012, 7:40 AM

## 2012-04-12 NOTE — Progress Notes (Signed)
ANTIBIOTIC CONSULT NOTE - FOLLOW UP  Pharmacy Consult for vancomycin, cefepime, levaquin Indication: pneumonia  No Known Allergies  Patient Measurements: Height: 4\' 10"  (147.3 cm) (estimate, pt couldn't tell me) Weight: 181 lb 14.1 oz (82.5 kg) IBW/kg (Calculated) : 40.9    Vital Signs: Temp: 98.9 F (37.2 C) (12/27 1120) Temp src: Oral (12/27 1029) BP: 87/53 mmHg (12/27 1120) Pulse Rate: 99  (12/27 1120) Intake/Output from previous day: 12/26 0701 - 12/27 0700 In: 60 [P.O.:60] Out: 2 [Stool:2] Intake/Output from this shift: Total I/O In: -  Out: 3902 [Other:3902]  Labs:  Basename 04/12/12 0500 04/11/12 0500 04/10/12 0500 04/10/12 0450  WBC 2.3* 2.0* -- 2.0*  HGB 10.2* 10.6* -- 10.3*  PLT 45* 48* -- 53*  LABCREA -- -- -- --  CREATININE 7.90* 6.41* 4.66* --   Estimated Creatinine Clearance: 6.2 ml/min (by C-G formula based on Cr of 7.9).  Basename 04/12/12 0703  VANCOTROUGH --  Leodis Binet --  VANCORANDOM 20.5  GENTTROUGH --  GENTPEAK --  GENTRANDOM --  TOBRATROUGH --  TOBRAPEAK --  TOBRARND --  AMIKACINPEAK --  AMIKACINTROU --  AMIKACIN --     Microbiology: Recent Results (from the past 720 hour(s))  MRSA PCR SCREENING     Status: Normal   Collection Time   04/09/12  3:56 AM      Component Value Range Status Comment   MRSA by PCR NEGATIVE  NEGATIVE Final     Anti-infectives     Start     Dose/Rate Route Frequency Ordered Stop   04/12/12 1800   ceFEPIme (MAXIPIME) 2 g in dextrose 5 % 50 mL IVPB  Status:  Discontinued        2 g 100 mL/hr over 30 Minutes Intravenous Once per day on Sun Tue Fri 04/10/12 1354 04/11/12 0957   04/12/12 1800   vancomycin (VANCOCIN) 750 mg in sodium chloride 0.9 % 150 mL IVPB  Status:  Discontinued        750 mg 150 mL/hr over 60 Minutes Intravenous Once per day on Sun Tue Fri 04/10/12 1354 04/11/12 0957   04/12/12 1200   vancomycin (VANCOCIN) 750 mg in sodium chloride 0.9 % 150 mL IVPB  Status:  Discontinued        750 mg 150 mL/hr over 60 Minutes Intravenous Every M-W-F (Hemodialysis) 04/09/12 1300 04/10/12 1354   04/12/12 1200   ceFEPIme (MAXIPIME) 2 g in dextrose 5 % 50 mL IVPB  Status:  Discontinued        2 g 100 mL/hr over 30 Minutes Intravenous Every M-W-F (Hemodialysis) 04/09/12 1300 04/10/12 1354   04/12/12 1200   vancomycin (VANCOCIN) 750 mg in sodium chloride 0.9 % 150 mL IVPB        750 mg 150 mL/hr over 60 Minutes Intravenous Every M-W-F (Hemodialysis) 04/11/12 0957     04/12/12 1200   ceFEPIme (MAXIPIME) 2 g in dextrose 5 % 50 mL IVPB        2 g 100 mL/hr over 30 Minutes Intravenous Every M-W-F (Hemodialysis) 04/11/12 0957     04/11/12 0000   levofloxacin (LEVAQUIN) IVPB 500 mg        500 mg 100 mL/hr over 60 Minutes Intravenous Every 48 hours 04/09/12 1304     04/10/12 2000   levofloxacin (LEVAQUIN) IVPB 500 mg  Status:  Discontinued        500 mg 100 mL/hr over 60 Minutes Intravenous Every 48 hours 04/09/12 0105 04/09/12 1304   04/10/12 1200  ceFEPIme (MAXIPIME) 2 g in dextrose 5 % 50 mL IVPB  Status:  Discontinued        2 g 100 mL/hr over 30 Minutes Intravenous Every M-W-F (Hemodialysis) 04/09/12 0105 04/09/12 1300   04/10/12 1200   vancomycin (VANCOCIN) 750 mg in sodium chloride 0.9 % 150 mL IVPB  Status:  Discontinued        750 mg 150 mL/hr over 60 Minutes Intravenous Every M-W-F (Hemodialysis) 04/09/12 0105 04/09/12 1300   04/09/12 1800   ceFEPIme (MAXIPIME) 2 g in dextrose 5 % 50 mL IVPB        2 g 100 mL/hr over 30 Minutes Intravenous  Once 04/09/12 1300 04/09/12 1938   04/09/12 1800   vancomycin (VANCOCIN) 750 mg in sodium chloride 0.9 % 150 mL IVPB        750 mg 150 mL/hr over 60 Minutes Intravenous  Once 04/09/12 1300     04/09/12 0200   levofloxacin (LEVAQUIN) IVPB 750 mg        750 mg 100 mL/hr over 90 Minutes Intravenous  Once 04/09/12 0110 04/09/12 0415   04/09/12 0130   vancomycin (VANCOCIN) 1,750 mg in sodium chloride 0.9 % 500 mL IVPB        1,750  mg 250 mL/hr over 120 Minutes Intravenous  Once 04/09/12 0107 04/09/12 0800   04/09/12 0130   ceFEPIme (MAXIPIME) 2 g in dextrose 5 % 50 mL IVPB        2 g 100 mL/hr over 30 Minutes Intravenous  Once 04/09/12 0110 04/09/12 0314   04/09/12 0115   vancomycin (VANCOCIN) 750 mg in sodium chloride 0.9 % 150 mL IVPB  Status:  Discontinued        750 mg 150 mL/hr over 60 Minutes Intravenous  Once 04/09/12 0105 04/09/12 0107   04/09/12 0100   levofloxacin (LEVAQUIN) IVPB 750 mg  Status:  Discontinued        750 mg 100 mL/hr over 90 Minutes Intravenous  Once 04/08/12 2351 04/09/12 0110   04/08/12 2359   ceFEPIme (MAXIPIME) 2 g in dextrose 5 % 50 mL IVPB  Status:  Discontinued        2 g 100 mL/hr over 30 Minutes Intravenous  Once 04/08/12 2338 04/09/12 0110   04/08/12 2345   vancomycin (VANCOCIN) IVPB 1000 mg/200 mL premix  Status:  Discontinued        1,000 mg 200 mL/hr over 60 Minutes Intravenous  Once 04/08/12 2338 04/09/12 0107          Assessment: Patient is a 66 y.o F on cefepime, levaquin, and vancomycin for suspected PNA.  Pre-HD vancomycin level came back therapeutic this morning as 20.5.  Patient went to HD today (~4hrs, BFR 400) with vancomycin 750mg  given after session.  Goal of Therapy:  Pre-HD vancomycin= 15-25  Plan:  1) cont vanc 750mg  QHD 2) no change for levaquin and cefepime  Tenzin Pavon P 04/12/2012,11:42 AM

## 2012-04-12 NOTE — Progress Notes (Signed)
CRITICAL VALUE ALERT  Critical value received:  Na  Date of notification:  115  Time of notification:  0800  Critical value read back:yes  Nurse who received alert: Margarita Grizzle RN  MD notified (1st page):  Hyman Hopes  Time of first page: 0800  MD notified (2nd page):  Time of second page:  Responding MD:  Hyman Hopes  Time MD responded: 0800  No further orders received. [facility] RN

## 2012-04-12 NOTE — Progress Notes (Signed)
[id number] Pt's bp 87/53-95/60. Pt asymptomatic sleeping; MD notified new orders noted

## 2012-04-13 LAB — RENAL FUNCTION PANEL
Albumin: 2.3 g/dL — ABNORMAL LOW (ref 3.5–5.2)
BUN: 11 mg/dL (ref 6–23)
Chloride: 95 mEq/L — ABNORMAL LOW (ref 96–112)
GFR calc Af Amer: 9 mL/min — ABNORMAL LOW (ref >90)
Glucose, Bld: 71 mg/dL (ref 70–99)
Potassium: 3.4 mEq/L — ABNORMAL LOW (ref 3.5–5.1)
Sodium: 133 mEq/L — ABNORMAL LOW (ref 135–145)

## 2012-04-13 LAB — CBC
HCT: 29.1 % — ABNORMAL LOW (ref 36.0–46.0)
Hemoglobin: 10.1 g/dL — ABNORMAL LOW (ref 12.0–15.0)
WBC: 1.7 10*3/uL — ABNORMAL LOW (ref 4.0–10.5)

## 2012-04-13 MED ORDER — MIDODRINE HCL 5 MG PO TABS
10.0000 mg | ORAL_TABLET | Freq: Three times a day (TID) | ORAL | Status: DC
  Administered 2012-04-13 (×2): 10 mg via ORAL
  Filled 2012-04-13 (×3): qty 2

## 2012-04-13 MED ORDER — LEVOFLOXACIN 250 MG PO TABS: 250.0000 mg | ORAL_TABLET | ORAL | Status: DC

## 2012-04-13 MED ORDER — MIDODRINE HCL 10 MG PO TABS: 10.0000 mg | ORAL_TABLET | Freq: Three times a day (TID) | ORAL | Status: DC

## 2012-04-13 NOTE — Progress Notes (Signed)
AVS reviewed with pt; teach back method used. Pt verbalized understanding of AVS and questions were answered. RX's given. Pt remains stable. IV and tele removed. Grandson called; message left with him that pt is being discharged. Grandson to come and get pt to take her home. Samantha [name], Samantha [name] Randy

## 2012-04-13 NOTE — Progress Notes (Signed)
[facility] ROUNDING NOTE   Subjective:   Interval History: walking around room with no shortness of breath.  Objective:  Vital signs in last 24 hours:  Temp:  [97.8 F (36.6 C)-98.9 F (37.2 C)] 97.8 F (36.6 C) (12/28 0456) Pulse Rate:  [88-100] 94  (12/28 0456) Resp:  [18-20] 18  (12/28 0456) BP: (80-132)/(32-64) 89/32 mmHg (12/28 0456) SpO2:  [98 %-100 %] 100 % (12/28 0456) Weight:  [82.5 kg (181 lb 14.1 oz)-83.3 kg (183 lb 10.3 oz)] 83.3 kg (183 lb 10.3 oz) (12/27 2209)  Weight change: -4.8 kg (-10 lb 9.3 oz) Filed Weights   04/12/12 0628 04/12/12 1029 04/12/12 2209  Weight: 86.5 kg (190 lb 11.2 oz) 82.5 kg (181 lb 14.1 oz) 83.3 kg (183 lb 10.3 oz)    Intake/Output: I/O last 3 completed shifts: In: 760 [P.O.:660; IV Piggyback:100] Out: 3905 [Urine:1; Other:3902; Stool:2]   Intake/Output this shift:  Total I/O In: 120 [P.O.:120] Out: -   CVS- RRR RS- CTA ABD- BS present soft non-distended EXT- 3+ edema chronic   Basic Metabolic Panel:  Lab 04/13/12 1610 04/12/12 0500 04/11/12 0500 04/10/12 0500 04/09/12 0500  NA 133* 115* 121* 130* 122*  K 3.4* 4.3 4.2 4.4 3.8  CL 95* 82* 87* 94* 84*  CO2 25 20 22 25 20   GLUCOSE 71 74 71 77 71  BUN 11 24* 18 12 23   CREATININE 5.04* 7.90* 6.41* 4.66* 6.71*  CALCIUM 8.4 8.9 8.8 -- --  MG -- -- -- -- --  PHOS 2.9 3.4 2.8 2.6 --    Liver Function Tests:  Lab 04/13/12 0605 04/12/12 0500 04/11/12 0500 04/10/12 0500 04/08/12 1722  AST -- -- -- -- 30  ALT -- -- -- -- 15  ALKPHOS -- -- -- -- 88  BILITOT -- -- -- -- 0.8  PROT -- -- -- -- 8.3  ALBUMIN 2.3* 2.3* 2.3* 2.4* 3.3*    Lab 04/11/12 0500 04/10/12 0500 04/08/12 1723  LIPASE 124* 154* 229*  AMYLASE -- -- --   No results found for this basename: AMMONIA:3 in the last 168 hours  CBC:  Lab 04/13/12 0605 04/12/12 0500 04/11/12 0500 04/10/12 0450 04/09/12 0500 04/08/12 1722  WBC 1.7* 2.3* 2.0* 2.0* 2.3* --  NEUTROABS -- -- -- -- -- 1.1*  HGB  10.1* 10.2* 10.6* 10.3* 10.8* --  HCT 29.1* 28.1* 31.0* 30.6* 32.5* --  MCV 94.2 91.8 95.1 97.1 97.9 --  PLT 45* 45* 48* 53* 62* --    Cardiac Enzymes:  Lab 04/09/12 0225  CKTOTAL --  CKMB --  CKMBINDEX --  TROPONINI <0.30    BNP: No components found with this basename: POCBNP:5  CBG: No results found for this basename: GLUCAP:5 in the last 168 hours  Microbiology: Results for orders placed during the hospital encounter of 04/08/12  MRSA PCR SCREENING     Status: Normal   Collection Time   04/09/12  3:56 AM      Component Value Range Status Comment   MRSA by PCR NEGATIVE  NEGATIVE Final     Coagulation Studies: No results found for this basename: LABPROT:5,INR:5 in the last 72 hours  Urinalysis: No results found for this basename: COLORURINE:2,APPERANCEUR:2,LABSPEC:2,PHURINE:2,GLUCOSEU:2,HGBUR:2,BILIRUBINUR:2,KETONESUR:2,PROTEINUR:2,UROBILINOGEN:2,NITRITE:2,LEUKOCYTESUR:2 in the last 72 hours    Imaging: No results found.   Medications:        . calcium acetate  1,334 mg Oral TID WC  . doxercalciferol  2 mcg Intravenous Q M,W,F-HD  . levofloxacin (LEVAQUIN) IV  500 mg  Intravenous Q48H  . levothyroxine  100 mcg Oral QAC breakfast  . midodrine  10 mg Oral TID WC  . multivitamin  1 tablet Oral QHS  . pantoprazole (PROTONIX) IV  40 mg Intravenous Q24H  . sodium chloride  3 mL Intravenous Q12H  . sodium chloride  3 mL Intravenous Q12H   acetaminophen, acetaminophen, ondansetron (ZOFRAN) IV, ondansetron, sodium chloride  Assessment/ Plan:  1. Dyspnea - now resolved; chest x-ray 12/24 negative, but CT with patchy bilateral lower lobe opacities (L > R), negative for PE; switch to levaquin 250mg  qod 2. Abdominal pain - improved 3. ESRD - HD on MWF   4. Hypertension/volume -start midodrine 10mg  tid 5. Anemia - Hgb 10.3, no outpatient Epogen or Fe. Aranesp  6. Metabolic bone disease - stable 7. Nutrition - stable 8. Hx thrombocytopenia - plts 53K.   9. Hypothyroidism - on Synthroid.    LOS: 5 Samantha [name] W @TODAY @9 :08 AM

## 2012-04-13 NOTE — Discharge Summary (Signed)
Physician Discharge Summary  Samantha [name] ZOX:[id number] DOB: December 11, 1945 DOA: 04/08/2012  PCP: Dorrene German, MD  Admit date: 04/08/2012 Discharge date: 04/13/2012  Time spent: >30 minutes  Recommendations for Outpatient Follow-up:      Follow-up Information    Follow up with AVBUERE,EDWIN A, MD. (in 1-2weeks, call for appt upon discharge)    Contact information:   [address] [place] [address] [phone]       Please follow up. (renal/dialysis as directed)          Discharge Diagnoses:  Principal Problem:  *Healthcare-associated pneumonia Active Problems:  ESRD on hemodialysis  HTN (hypertension)  SOB (shortness of breath)  Abdominal pain  Hypotension  Pancreatitis  Hyponatremia   Discharge Condition: improved/stable  Diet recommendation: renal diet  Filed Weights   04/12/12 0628 04/12/12 1029 04/12/12 2209  Weight: 86.5 kg (190 lb 11.2 oz) 82.5 kg (181 lb 14.1 oz) 83.3 kg (183 lb 10.3 oz)    History of present illness:  Pt is a 66 year-old female with history of ESRD on hemodialysis presents with complaints of worsening shortness of breath last 24 hours with epigastric pain. Patient states that she has been compliant with her dialysis and her dialysis schedule as per patient was recently changed to include Sunday. Patient states she also has been having some nonproductive cough but denies any fever chills or chest pain. In addition patient has been having epigastric discomfort last 2 days associated with some nausea and at least one or 2 episodes of vomiting. Denies any diarrhea. The pain is mostly epigastric area and comes episodic and patient states when it happens she feels like some knot. At this time patient has been admitted for further management. Patient's lipase was found to be mildly elevated.   Hospital Course:  Healthcare associated pneumonia  Patient presented with shortness of breath likely related to pneumonia which was evident on  chest x-ray. Patient placed on IV vancomycin, cefepime and Levaquin. Her antibiotics were narrowed down as she improved and she remained afebrile . She was switched to levaquin monotherapy and  Abdominal pain  Patient complained of epigastric and RUQ discomfort with elevated lipase. She recently had a cholecystectomy. Abdominal CT scan unremarkable for pancreatitis or CBD stone. Lipase level improving. Her diet was advanced which she has tolerated and is medically stable for discharge at this time for outpt follow up  End-stage renal disease  Renal followed and dialyzed pt while in the hospital and  Hypotension  Per renal she has chronically low BP requiring short normal saline boluses in the hospital.  Dr Hyman Hopes followed and started her on midodrine today which she is to continue upon discharge. She is asymptomatic at this time. Hyponatremia  asymptomatic. A 115 this am. HD today. Discussed with renal. Repeat in am and if stable can be discharged home.  Chronic leukopenia and thrombocytopenia  Renal & outpt MDs to Continue to monitor outpt Hypothyroidism  Continue Synthroid    Procedures:  dialysis  Consultations:  renal  Discharge Exam: Filed Vitals:   04/12/12 1434 04/12/12 2209 04/13/12 0456 04/13/12 1145  BP: 88/47 80/40 89/32  89/51  Pulse: 100 88 94 86  Temp: 98.3 F (36.8 C) 98.4 F (36.9 C) 97.8 F (36.6 C)   TempSrc: Oral Oral Oral   Resp: 20 18 18    Height:      Weight:  83.3 kg (183 lb 10.3 oz)    SpO2: 98% 99% 100%     Exam:  General:  Elderly female in no acute distress  HEENT: No pallor, moist oral mucosa  Cardiovascular: Normal S1 and S2, no murmurs  Respiratory: Equal air entry bilaterally, no crackles, no wheezing or rhonchi  Abdomen: Soft, nontender, nondistended, bowel sounds present Extremities: Warm, no edema  CNS: AAO x3   Discharge Instructions  Discharge Orders    Future Orders Please Complete By Expires   Diet renal [id number]       Increase activity slowly          Medication List     As of 04/13/2012  1:18 PM    TAKE these medications         calcium acetate 667 MG capsule   Commonly known as: PHOSLO   Take 1,334 mg by mouth 3 (three) times daily with meals.      diclofenac sodium 1 % Gel   Commonly known as: VOLTAREN   Apply 2 g topically daily as needed. For pain      levofloxacin 250 MG tablet   Commonly known as: LEVAQUIN   Take 1 tablet (250 mg total) by mouth every other day.      levothyroxine 100 MCG tablet   Commonly known as: SYNTHROID, LEVOTHROID   Take 100 mcg by mouth daily.      midodrine 10 MG tablet   Commonly known as: PROAMATINE   Take 1 tablet (10 mg total) by mouth 3 (three) times daily with meals.      multivitamin with minerals Tabs   Take 1 tablet by mouth daily.           Follow-up Information    Follow up with AVBUERE,EDWIN A, MD. (in 1-2weeks, call for appt upon discharge)    Contact information:   [address] [place] [address] [phone]       Please follow up. (renal/dialysis as directed)           The results of significant diagnostics from this hospitalization (including imaging, microbiology, ancillary and laboratory) are listed below for reference.    Significant Diagnostic Studies: Ct Angio Chest Pe W/cm &/or Wo Cm  04/08/2012  *RADIOLOGY REPORT*  Clinical Data:  Shortness of breath, upper abdominal pain, on dialysis  CT ANGIOGRAPHY CHEST CT ABDOMEN AND PELVIS WITH CONTRAST  Technique:  Multidetector CT imaging of the chest was performed using the standard protocol during bolus administration of intravenous contrast.  Multiplanar CT image reconstructions including MIPs were obtained to evaluate the vascular anatomy. Multidetector CT imaging of the abdomen and pelvis was performed using the standard protocol during bolus administration of intravenous contrast.  Contrast: OMNIPAQUE IOHEXOL 350 MG/ML SOLN  Comparison:  Chest radiograph  dated 04/08/2012.  CTA CHEST  Findings:  No evidence of pulmonary embolism.  Patchy left lower lobe opacity, suspicious for pneumonia.  Mild patchy right basilar opacity, atelectasis versus pneumonia. No pleural effusion or pneumothorax.  The heart is mildly enlarged.  No pericardial effusion.  Mild atherosclerotic calcifications of the aortic arch.  Small mediastinal lymph nodes which do not meet pathologic CT size criteria.  No suspicious hilar or axillary lymphadenopathy.  Lower esophagus is mildly thick-walled, nonspecific.  Mild degenerative changes of the visualized thoracolumbar spine. Renal osteodystrophy.  Review of the MIP images confirms the above findings.  IMPRESSION: No evidence of pulmonary embolism.  Patchy bilateral lower lobe opacities, left greater than right, suspicious for pneumonia.  Mild lower esophageal wall thickening, correlate for esophagitis.  CT ABDOMEN AND PELVIS  Findings: Cirrhotic configuration.  No  suspicious/thin enhancing hepatic lesions.  Spleen is normal in size, measuring 11.7 cm in craniocaudal dimension.  Gastroesophageal varices.  Portal vein remains patent.  No peripancreatic inflammatory changes by CT.  Adrenal glands are unremarkable.  Status post cholecystectomy.  No intrahepatic ductal dilatation. Common duct measures 14 mm (series 13/image 30) but tapers at the ampulla.  Bilateral renal atrophy.  10 mm left renal cyst (series 18/image 12).  No hydronephrosis.  No evidence of bowel obstruction.  Normal appendix.  Atherosclerotic calcifications of the abdominal aorta and branch vessels.  No abdominopelvic ascites.  No suspicious abdominopelvic lymphadenopathy.  Uterus and right ovary are unremarkable.  Suspected left ovarian cyst (series 13/image 61), although incompletely characterized.  Bladder is decompressed.  Degenerative changes of the lumbar spine.  Renal osteodystrophy.  Review of the MIP images confirms the above findings.  IMPRESSION: Cirrhosis with  gastroesophageal varices.  Portal vein remains patent.  Status post cholecystectomy.  Common duct measures 14 mm but tapers at the ampulla.  In the setting of normal LFTs, this is likely postsurgical.  No peripancreatic inflammatory changes by CT.  Suspected left ovarian cyst, incompletely characterized.  Given postmenopausal status, consider nonemergent pelvic ultrasound for further evaluation.   Original Report Authenticated By: Charline Bills, M.D.    Ct Abdomen Pelvis W Contrast  04/08/2012  *RADIOLOGY REPORT*  Clinical Data:  Shortness of breath, upper abdominal pain, on dialysis  CT ANGIOGRAPHY CHEST CT ABDOMEN AND PELVIS WITH CONTRAST  Technique:  Multidetector CT imaging of the chest was performed using the standard protocol during bolus administration of intravenous contrast.  Multiplanar CT image reconstructions including MIPs were obtained to evaluate the vascular anatomy. Multidetector CT imaging of the abdomen and pelvis was performed using the standard protocol during bolus administration of intravenous contrast.  Contrast: OMNIPAQUE IOHEXOL 350 MG/ML SOLN  Comparison:  Chest radiograph dated 04/08/2012.  CTA CHEST  Findings:  No evidence of pulmonary embolism.  Patchy left lower lobe opacity, suspicious for pneumonia.  Mild patchy right basilar opacity, atelectasis versus pneumonia. No pleural effusion or pneumothorax.  The heart is mildly enlarged.  No pericardial effusion.  Mild atherosclerotic calcifications of the aortic arch.  Small mediastinal lymph nodes which do not meet pathologic CT size criteria.  No suspicious hilar or axillary lymphadenopathy.  Lower esophagus is mildly thick-walled, nonspecific.  Mild degenerative changes of the visualized thoracolumbar spine. Renal osteodystrophy.  Review of the MIP images confirms the above findings.  IMPRESSION: No evidence of pulmonary embolism.  Patchy bilateral lower lobe opacities, left greater than right, suspicious for pneumonia.   Mild lower esophageal wall thickening, correlate for esophagitis.  CT ABDOMEN AND PELVIS  Findings: Cirrhotic configuration.  No suspicious/thin enhancing hepatic lesions.  Spleen is normal in size, measuring 11.7 cm in craniocaudal dimension.  Gastroesophageal varices.  Portal vein remains patent.  No peripancreatic inflammatory changes by CT.  Adrenal glands are unremarkable.  Status post cholecystectomy.  No intrahepatic ductal dilatation. Common duct measures 14 mm (series 13/image 30) but tapers at the ampulla.  Bilateral renal atrophy.  10 mm left renal cyst (series 18/image 12).  No hydronephrosis.  No evidence of bowel obstruction.  Normal appendix.  Atherosclerotic calcifications of the abdominal aorta and branch vessels.  No abdominopelvic ascites.  No suspicious abdominopelvic lymphadenopathy.  Uterus and right ovary are unremarkable.  Suspected left ovarian cyst (series 13/image 61), although incompletely characterized.  Bladder is decompressed.  Degenerative changes of the lumbar spine.  Renal osteodystrophy.  Review of the  MIP images confirms the above findings.  IMPRESSION: Cirrhosis with gastroesophageal varices.  Portal vein remains patent.  Status post cholecystectomy.  Common duct measures 14 mm but tapers at the ampulla.  In the setting of normal LFTs, this is likely postsurgical.  No peripancreatic inflammatory changes by CT.  Suspected left ovarian cyst, incompletely characterized.  Given postmenopausal status, consider nonemergent pelvic ultrasound for further evaluation.   Original Report Authenticated By: Charline Bills, M.D.    Dg Chest Portable 1 View  04/08/2012  *RADIOLOGY REPORT*  Clinical Data: Lower chest pain, shortness of breath, diabetes  PORTABLE CHEST - 1 VIEW  Comparison: Portable exam 1711 hours compared to 09/22/2011  Findings: Enlargement of cardiac silhouette. Calcified tortuous aorta. Pulmonary vascularity normal. Peribronchial thickening without infiltrate, pleural  effusion or pneumothorax. Bones demineralized. Prior left shoulder replacement.  IMPRESSION: Mild enlargement of cardiac silhouette. Bronchitic changes without acute infiltrate.   Original Report Authenticated By: Ulyses Southward, M.D.     Microbiology: Recent Results (from the past 240 hour(s))  MRSA PCR SCREENING     Status: Normal   Collection Time   04/09/12  3:56 AM      Component Value Range Status Comment   MRSA by PCR NEGATIVE  NEGATIVE Final      Labs: Basic Metabolic Panel:  Lab 04/13/12 1610 04/12/12 0500 04/11/12 0500 04/10/12 0500 04/09/12 0500  NA 133* 115* 121* 130* 122*  K 3.4* 4.3 4.2 4.4 3.8  CL 95* 82* 87* 94* 84*  CO2 25 20 22 25 20   GLUCOSE 71 74 71 77 71  BUN 11 24* 18 12 23   CREATININE 5.04* 7.90* 6.41* 4.66* 6.71*  CALCIUM 8.4 8.9 8.8 8.7 8.9  MG -- -- -- -- --  PHOS 2.9 3.4 2.8 2.6 --   Liver Function Tests:  Lab 04/13/12 0605 04/12/12 0500 04/11/12 0500 04/10/12 0500 04/08/12 1722  AST -- -- -- -- 30  ALT -- -- -- -- 15  ALKPHOS -- -- -- -- 88  BILITOT -- -- -- -- 0.8  PROT -- -- -- -- 8.3  ALBUMIN 2.3* 2.3* 2.3* 2.4* 3.3*    Lab 04/11/12 0500 04/10/12 0500 04/08/12 1723  LIPASE 124* 154* 229*  AMYLASE -- -- --   No results found for this basename: AMMONIA:5 in the last 168 hours CBC:  Lab 04/13/12 0605 04/12/12 0500 04/11/12 0500 04/10/12 0450 04/09/12 0500 04/08/12 1722  WBC 1.7* 2.3* 2.0* 2.0* 2.3* --  NEUTROABS -- -- -- -- -- 1.1*  HGB 10.1* 10.2* 10.6* 10.3* 10.8* --  HCT 29.1* 28.1* 31.0* 30.6* 32.5* --  MCV 94.2 91.8 95.1 97.1 97.9 --  PLT 45* 45* 48* 53* 62* --   Cardiac Enzymes:  Lab 04/09/12 0225  CKTOTAL --  CKMB --  CKMBINDEX --  TROPONINI <0.30   BNP: BNP (last 3 results)  Basename 04/09/12 0225  PROBNP 3863.0*   CBG: No results found for this basename: GLUCAP:5 in the last 168 hours     Signed:  Brittany Amirault C  [facility] 04/13/2012, 1:18 PM

## 2012-04-23 ENCOUNTER — Encounter (INDEPENDENT_AMBULATORY_CARE_PROVIDER_SITE_OTHER): Payer: Self-pay

## 2012-05-14 ENCOUNTER — Encounter (INDEPENDENT_AMBULATORY_CARE_PROVIDER_SITE_OTHER): Payer: Self-pay

## 2012-05-21 ENCOUNTER — Other Ambulatory Visit (HOSPITAL_COMMUNITY): Payer: Self-pay | Admitting: Nephrology

## 2012-05-21 DIAGNOSIS — N186 End stage renal disease: Secondary | ICD-10-CM

## 2012-05-23 ENCOUNTER — Other Ambulatory Visit (HOSPITAL_COMMUNITY): Payer: Self-pay | Admitting: Nephrology

## 2012-05-23 ENCOUNTER — Ambulatory Visit (HOSPITAL_COMMUNITY)
Admission: RE | Admit: 2012-05-23 | Discharge: 2012-05-23 | Disposition: A | Discharge status: Home / Self Care | Payer: Medicare Other | Source: Ambulatory Visit | Attending: Nephrology | Admitting: Nephrology

## 2012-05-23 DIAGNOSIS — Y832 Surgical operation with anastomosis, bypass or graft as the cause of abnormal reaction of the patient, or of later complication, without mention of misadventure at the time of the procedure: Secondary | ICD-10-CM | POA: Insufficient documentation

## 2012-05-23 DIAGNOSIS — N186 End stage renal disease: Secondary | ICD-10-CM

## 2012-05-23 DIAGNOSIS — E119 Type 2 diabetes mellitus without complications: Secondary | ICD-10-CM | POA: Insufficient documentation

## 2012-05-23 DIAGNOSIS — Z992 Dependence on renal dialysis: Secondary | ICD-10-CM | POA: Insufficient documentation

## 2012-05-23 DIAGNOSIS — T82898A Other specified complication of vascular prosthetic devices, implants and grafts, initial encounter: Secondary | ICD-10-CM | POA: Insufficient documentation

## 2012-05-23 DIAGNOSIS — I120 Hypertensive chronic kidney disease with stage 5 chronic kidney disease or end stage renal disease: Secondary | ICD-10-CM | POA: Insufficient documentation

## 2012-05-23 MED ORDER — IOHEXOL 300 MG/ML  SOLN
100.0000 mL | Freq: Once | INTRAMUSCULAR | Status: AC | PRN
  Administered 2012-05-23: 50 mL via INTRAVENOUS

## 2012-05-23 NOTE — H&P (Signed)
Samantha [name] is an 67 y.o. female.   Chief Complaint: Here for shuntogram due to "decreased access flows" HPI: ESRD with left arm dialysis graft and decreased access flows.  Previous angioplasty of venous anastomosis.  Shuntogram today demonstrates recurrent narrowing at the venous anastomosis.  Past Medical History  Diagnosis Date  . Shoulder pain   . Headache     occasionally  . Dizziness   . Occasional numbness/prickling/tingling of fingers and toes   . Arthritis     left shoulder  . Joint pain   . Joint swelling   . Gastric ulcer   . Dry skin   . Peripheral edema   . Constipation   . Oligouria   . H/O: GI bleed   . Hepatitis     Hep B  . History of kidney stones   . History of blood transfusion   . Hypertension   . Anemia   . Hypothyroidism     takes Synthroid daily  . Impaired hearing   . Diabetes mellitus     borderline  . Renal disorder     m, w, F     Past Surgical History  Procedure Date  . Shoulder surgery 3-65yrs ago    right replacement  . Esophagogastroduodenoscopy 06/09/2011    Procedure: ESOPHAGOGASTRODUODENOSCOPY (EGD);  Surgeon: Theda Belfast, MD;  Location: [facility] ENDOSCOPY;  Service: Endoscopy;  Laterality: N/A;  . Cataract surgery     bilateral  . Reverse shoulder arthroplasty 09/22/2011    Procedure: REVERSE SHOULDER ARTHROPLASTY;  Surgeon: Verlee Rossetti, MD;  Location: [facility] OR;  Service: Orthopedics;  Laterality: Left;  left reverse shoulder arthroplasty  . Cholecystectomy 12/26/2011  . Cholecystectomy 12/26/2011    Procedure: LAPAROSCOPIC CHOLECYSTECTOMY;  Surgeon: Atilano Ina, MD,FACS;  Location: [facility] OR;  Service: General;  Laterality: N/A;    No family history on file. Social History:  reports that she has never smoked. Her smokeless tobacco use includes Chew. She reports that she does not drink alcohol or use illicit drugs.  Allergies: No Known Allergies  Filed Vitals:   05/23/12 0843  BP: 158/99  Pulse: 82  Resp: 15   Review of  Systems  Constitutional: Negative.     Blood pressure 158/99, pulse 82, resp. rate 15, SpO2 100.00%. Physical Exam  Cardiovascular: Normal rate, regular rhythm and normal heart sounds.   Respiratory: Effort normal and breath sounds normal.  GI: Soft.     Assessment/Plan ESRD with left arm dialysis graft.  Recurrent narrowing at the venous anastomosis.  Informed consent obtained for a balloon angioplasty.  Plan to perform angioplasty without sedation.    Tyne Banta RYAN 05/23/2012, 9:18 AM

## 2012-05-23 NOTE — Procedures (Signed)
Shuntogram demonstrated recurrent narrowing at venous anastomosis.  Stenosis was successfully treated with 7 mm balloon.  No immediate complication.

## 2012-08-01 ENCOUNTER — Other Ambulatory Visit: Payer: Self-pay | Admitting: Internal Medicine

## 2012-08-01 DIAGNOSIS — K219 Gastro-esophageal reflux disease without esophagitis: Secondary | ICD-10-CM

## 2012-08-13 ENCOUNTER — Other Ambulatory Visit: Payer: Medicare Other

## 2012-08-20 ENCOUNTER — Ambulatory Visit
Admission: RE | Admit: 2012-08-20 | Discharge: 2012-08-20 | Disposition: A | Discharge status: Home / Self Care | Payer: Medicare Other | Source: Ambulatory Visit | Attending: Internal Medicine | Admitting: Internal Medicine

## 2012-08-20 DIAGNOSIS — K219 Gastro-esophageal reflux disease without esophagitis: Secondary | ICD-10-CM

## 2012-08-22 ENCOUNTER — Other Ambulatory Visit: Payer: Self-pay | Admitting: *Deleted

## 2012-08-23 ENCOUNTER — Telehealth: Payer: Self-pay | Admitting: *Deleted

## 2012-08-23 NOTE — Telephone Encounter (Signed)
PER DR COLODONATO  NEEDS SHUNTOGRAM AND EVALUATION OF PAINFUL NODULE OVER AVG

## 2012-08-29 ENCOUNTER — Ambulatory Visit (HOSPITAL_COMMUNITY)
Admission: RE | Admit: 2012-08-29 | Discharge: 2012-08-29 | Disposition: A | Discharge status: Home / Self Care | Payer: Medicare Other | Source: Ambulatory Visit | Attending: Vascular Surgery | Admitting: Vascular Surgery

## 2012-08-29 ENCOUNTER — Encounter (HOSPITAL_COMMUNITY): Payer: Self-pay | Admitting: Pharmacy Technician

## 2012-08-29 ENCOUNTER — Encounter (HOSPITAL_COMMUNITY)
Admission: RE | Disposition: A | Discharge status: Home / Self Care | Payer: Self-pay | Source: Ambulatory Visit | Attending: Vascular Surgery

## 2012-08-29 ENCOUNTER — Other Ambulatory Visit: Payer: Self-pay | Admitting: *Deleted

## 2012-08-29 DIAGNOSIS — I871 Compression of vein: Secondary | ICD-10-CM | POA: Insufficient documentation

## 2012-08-29 DIAGNOSIS — R7309 Other abnormal glucose: Secondary | ICD-10-CM | POA: Insufficient documentation

## 2012-08-29 DIAGNOSIS — T82898A Other specified complication of vascular prosthetic devices, implants and grafts, initial encounter: Secondary | ICD-10-CM | POA: Insufficient documentation

## 2012-08-29 DIAGNOSIS — Z4931 Encounter for adequacy testing for hemodialysis: Secondary | ICD-10-CM

## 2012-08-29 DIAGNOSIS — M19019 Primary osteoarthritis, unspecified shoulder: Secondary | ICD-10-CM | POA: Insufficient documentation

## 2012-08-29 DIAGNOSIS — Y832 Surgical operation with anastomosis, bypass or graft as the cause of abnormal reaction of the patient, or of later complication, without mention of misadventure at the time of the procedure: Secondary | ICD-10-CM | POA: Insufficient documentation

## 2012-08-29 DIAGNOSIS — B1910 Unspecified viral hepatitis B without hepatic coma: Secondary | ICD-10-CM | POA: Insufficient documentation

## 2012-08-29 DIAGNOSIS — I120 Hypertensive chronic kidney disease with stage 5 chronic kidney disease or end stage renal disease: Secondary | ICD-10-CM | POA: Insufficient documentation

## 2012-08-29 DIAGNOSIS — D649 Anemia, unspecified: Secondary | ICD-10-CM | POA: Insufficient documentation

## 2012-08-29 DIAGNOSIS — E039 Hypothyroidism, unspecified: Secondary | ICD-10-CM | POA: Insufficient documentation

## 2012-08-29 DIAGNOSIS — N186 End stage renal disease: Secondary | ICD-10-CM

## 2012-08-29 HISTORY — PX: SHUNTOGRAM: SHX5491

## 2012-08-29 LAB — BASIC METABOLIC PANEL
BUN: 20 mg/dL (ref 6–23)
Chloride: 97 mEq/L (ref 96–112)
Creatinine, Ser: 6.27 mg/dL — ABNORMAL HIGH (ref 0.50–1.10)
GFR calc Af Amer: 7 mL/min — ABNORMAL LOW (ref >90)
GFR calc non Af Amer: 6 mL/min — ABNORMAL LOW (ref >90)
Glucose, Bld: 89 mg/dL (ref 70–99)

## 2012-08-29 SURGERY — ASSESSMENT, SHUNT FUNCTION, WITH CONTRAST RADIOGRAPHIC STUDY
Anesthesia: LOCAL | Laterality: Left

## 2012-08-29 MED ORDER — ACETAMINOPHEN 325 MG PO TABS: 650.0000 mg | ORAL_TABLET | ORAL | Status: DC | PRN

## 2012-08-29 MED ORDER — HEPARIN (PORCINE) IN NACL 2-0.9 UNIT/ML-% IJ SOLN
INTRAMUSCULAR | Status: AC
  Filled 2012-08-29: qty 500

## 2012-08-29 MED ORDER — LIDOCAINE HCL (PF) 1 % IJ SOLN
INTRAMUSCULAR | Status: AC
  Filled 2012-08-29: qty 30

## 2012-08-29 MED ORDER — SODIUM CHLORIDE 0.9 % IJ SOLN: 3.0000 mL | INTRAMUSCULAR | Status: DC | PRN

## 2012-08-29 MED ORDER — HEPARIN SODIUM (PORCINE) 1000 UNIT/ML IJ SOLN
INTRAMUSCULAR | Status: AC
  Filled 2012-08-29: qty 1

## 2012-08-29 MED ORDER — SODIUM CHLORIDE 0.9 % IJ SOLN: 3.0000 mL | Freq: Two times a day (BID) | INTRAMUSCULAR | Status: DC

## 2012-08-29 MED ORDER — ONDANSETRON HCL 4 MG/2ML IJ SOLN: 4.0000 mg | Freq: Four times a day (QID) | INTRAMUSCULAR | Status: DC | PRN

## 2012-08-29 MED ORDER — SODIUM CHLORIDE 0.9 % IV SOLN: 250.0000 mL | INTRAVENOUS | Status: DC | PRN

## 2012-08-29 NOTE — Op Note (Addendum)
OPERATIVE NOTE   PROCEDURE: 1.  left forearm arteriovenous graft cannulation under ultrasound guidance 2.  left arm shuntogram 3.  Venoplasty of brachial vein x 2 (6 mm x 40 mm, 8 mm x 40 mm)  PRE-OPERATIVE DIAGNOSIS: Malfunctioning left forearmarteriovenous graft  POST-OPERATIVE DIAGNOSIS: same as above   SURGEON: Leonides Sake, MD  ANESTHESIA: local  ESTIMATED BLOOD LOSS: 5 cc  FINDING(S):  Recurrent stenoses at distal brachial vein: >90% and >75% serial stenoses: <30% residual stenosis after serial angioplasty  Widely patent central venous structures  Tapered arterial arm suggestive 4 mm - 7 mm AVG  SPECIMEN(S):  None  CONTRAST: 35 cc  INDICATIONS: Samantha [name] is a 67 y.o. female who presents with malfunctioning left forearm arteriovenous graft.  The patient is scheduled for left arm shuntogram.  The patient is aware the risks include but are not limited to: bleeding, infection, thrombosis of the cannulated access, possible rupture of access, and possible anaphylactic reaction to the contrast.  The patient is aware of the risks of the procedure and elects to proceed forward.  DESCRIPTION: After full informed written consent was obtained, the patient was brought back to the angiography suite and placed supine upon the angiography table.  The patient was connected to monitoring equipment.  The left forearm was prepped and draped in the standard fashion for a percutaneous access intervention.  Under ultrasound guidance, the left forearm arteriovenous graft was cannulated with a micropuncture needle.  The microwire was advanced into the fistula and the needle was exchanged for the a microsheath, which was lodged 2 cm into the access.  The wire was removed and the sheath was connected to the IV extension tubing.  Hand injections were completed to image the access from the forearm up to the level of axilla.  The central venous structures were also imaged by hand injections.  Based on  the images, this patient will need: venoplasty of brachial vein.  A Benson wire was advanced into the axillary vein and the sheath was exchanged for a short 6-Fr sheath.  Based on the the imaging, a 6 mm x 40 mm angioplasty balloon was selected.  The balloon was centered around the stenoses and inflated to 18 atm for 2 minutes.  On completion imaging, a >30% residual stenosis is present.  At this point, the balloon was exchanged for a 8 mm x 40 angioplasty balloon.  The balloon was centered around the stenosis and inflated to 10 atm for 2 minutes.  On completion imaging, a <30% residual stenosis is present.  Based on the completion imaging, no further intervention is necessary.  The wire and balloon were removed from the sheath.  A 4-0 Monocryl purse-string suture was sewn around the sheath.  The sheath was removed while tying down the suture.  A sterile bandage was applied to the puncture site.  If the venous stenosis recurs, I would recommend proceeding with a revision of the forearm arteriovenous graft rather repeating the venoplasty.  COMPLICATIONS: none  CONDITION: stable  Leonides Sake, MD [facility] [place] Office: [phone] Pager: [phone]  08/29/2012 11:44 AM

## 2012-08-29 NOTE — H&P (Signed)
[facility] [place]  Brief History and Physical  History of Present Illness  Samantha [name] is a 67 y.o. female who presents with chief complaint: poor flow rates in left forearm arteriovenous graft.  The patient presents today for Left shuntogram, possible intervention.    Past Medical History  Diagnosis Date  . Shoulder pain   . Headache     occasionally  . Dizziness   . Occasional numbness/prickling/tingling of fingers and toes   . Arthritis     left shoulder  . Joint pain   . Joint swelling   . Gastric ulcer   . Dry skin   . Peripheral edema   . Constipation   . Oligouria   . H/O: GI bleed   . Hepatitis     Hep B  . History of kidney stones   . History of blood transfusion   . Hypertension   . Anemia   . Hypothyroidism     takes Synthroid daily  . Impaired hearing   . Diabetes mellitus     borderline  . Renal disorder     m, w, F     Past Surgical History  Procedure Laterality Date  . Shoulder surgery  3-51yrs ago    right replacement  . Esophagogastroduodenoscopy  06/09/2011    Procedure: ESOPHAGOGASTRODUODENOSCOPY (EGD);  Surgeon: Theda Belfast, MD;  Location: [facility] ENDOSCOPY;  Service: Endoscopy;  Laterality: N/A;  . Cataract surgery      bilateral  . Reverse shoulder arthroplasty  09/22/2011    Procedure: REVERSE SHOULDER ARTHROPLASTY;  Surgeon: Verlee Rossetti, MD;  Location: [facility] OR;  Service: Orthopedics;  Laterality: Left;  left reverse shoulder arthroplasty  . Cholecystectomy  12/26/2011  . Cholecystectomy  12/26/2011    Procedure: LAPAROSCOPIC CHOLECYSTECTOMY;  Surgeon: Atilano Ina, MD,FACS;  Location: [facility] OR;  Service: General;  Laterality: N/A;    History   Social History  . Marital Status: Married    Spouse Name: N/A    Number of Children: N/A  . Years of Education: N/A   Occupational History  . Not on file.   Social History Main Topics  . Smoking status: Never Smoker   . Smokeless tobacco: Current User    Types:  Chew  . Alcohol Use: No     Comment: quit 4-74yrs ago  . Drug Use: No  . Sexually Active: No   Other Topics Concern  . Not on file   Social History Narrative  . No narrative on file    No family history on file.  No current facility-administered medications on file prior to encounter.   Current Outpatient Prescriptions on File Prior to Encounter  Medication Sig Dispense Refill  . calcium acetate (PHOSLO) 667 MG capsule Take 1,334 mg by mouth 3 (three) times daily with meals.      . diclofenac sodium (VOLTAREN) 1 % GEL Apply 2 g topically daily as needed. For pain      . levofloxacin (LEVAQUIN) 250 MG tablet Take 1 tablet (250 mg total) by mouth every other day.  4 tablet  0  . levothyroxine (SYNTHROID, LEVOTHROID) 100 MCG tablet Take 100 mcg by mouth daily.      . midodrine (PROAMATINE) 10 MG tablet Take 1 tablet (10 mg total) by mouth 3 (three) times daily with meals.  90 tablet  0  . Multiple Vitamin (MULITIVITAMIN WITH MINERALS) TABS Take 1 tablet by mouth daily.        No  Known Allergies  Review of Systems: As listed above, otherwise negative.  Physical Examination  Filed Vitals:   08/29/12 0842  BP: 121/49  Pulse: 84  Temp: 98.2 F (36.8 C)  TempSrc: Oral  Resp: 18  Height: 4\' 10"  (1.473 m)  Weight: 183 lb (83.008 kg)  SpO2: 100%    General: A&O x 3, WDWN  Pulmonary: Sym exp, good air movt, CTAB, no rales, rhonchi, & wheezing  Cardiac: RRR, Nl S1, S2, no Murmurs, rubs or gallops  Gastrointestinal: soft, NTND, -G/R, - HSM, - masses, - CVAT B  Musculoskeletal: M/S 5/5 throughout , Extremities without ischemic changes , L forearm AVG with thrill and bruit  Laboratory See iStat  Medical Decision Making  Samantha [name] is a 67 y.o. 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   The patient is scheduled for: L shuntogram, possible intervention. I discussed with the patient the nature of angiographic procedures, especially the limited  patencies of any endovascular intervention.  The patient is aware of that the risks of an angiographic procedure include but are not limited to: bleeding, infection, access site complications, renal failure, embolization, rupture of vessel, dissection, possible need for emergent surgical intervention, possible need for surgical procedures to treat the patient's pathology, and stroke and death.    The patient is aware of the risks and agrees to proceed.  Samantha Sake, MD [facility] [place] Office: [phone] Pager: [phone]  08/29/2012, 8:35 AM

## 2012-08-30 ENCOUNTER — Telehealth: Payer: Self-pay | Admitting: Vascular Surgery

## 2012-08-30 NOTE — Telephone Encounter (Addendum)
Message copied by Fredrich Birks on Fri Aug 30, 2012  2:26 PM ------      Message from: [place], [place] K      Created: Thu Aug 29, 2012  1:49 PM      Regarding: schedule                   ----- Message -----         From: Erenest Blank, RN         Sent: 08/29/2012   1:13 PM           To: Sharee Pimple, CMA, [facility]                        ----- Message -----         From: Fransisco Hertz, MD         Sent: 08/29/2012  11:48 AM           To: Reuel Derby, Melene Plan, RN            Samantha [name]      [id number]      1945/07/18            PROCEDURE:      1.  left forearm arteriovenous graft cannulation under ultrasound guidance      2.  left arm shuntogram      3.  Venoplasty of brachial vein x 2 (6 mm x 40 mm, 8 mm x 40 mm)            Follow-up: 3 months            Orders(s) for follow-up: L arm access duplex       ------  08/30/12: lm for patient, dpm

## 2012-12-05 ENCOUNTER — Encounter: Payer: Self-pay | Admitting: Vascular Surgery

## 2012-12-06 ENCOUNTER — Ambulatory Visit: Payer: Medicare Other | Admitting: Vascular Surgery

## 2013-01-10 ENCOUNTER — Ambulatory Visit: Payer: Medicare Other | Admitting: Vascular Surgery

## 2013-01-15 ENCOUNTER — Encounter: Payer: Self-pay | Admitting: Vascular Surgery

## 2013-01-16 ENCOUNTER — Encounter: Payer: Self-pay | Admitting: Vascular Surgery

## 2013-01-16 ENCOUNTER — Ambulatory Visit (HOSPITAL_COMMUNITY)
Admission: RE | Admit: 2013-01-16 | Discharge: 2013-01-16 | Disposition: A | Discharge status: Home / Self Care | Payer: Medicare Other | Source: Ambulatory Visit | Attending: Vascular Surgery | Admitting: Vascular Surgery

## 2013-01-16 ENCOUNTER — Ambulatory Visit (INDEPENDENT_AMBULATORY_CARE_PROVIDER_SITE_OTHER): Payer: Medicare Other | Admitting: Vascular Surgery

## 2013-01-16 VITALS — BP 124/66 | HR 65 | Ht <= 58 in | Wt 174.0 lb

## 2013-01-16 DIAGNOSIS — Z4931 Encounter for adequacy testing for hemodialysis: Secondary | ICD-10-CM | POA: Insufficient documentation

## 2013-01-16 DIAGNOSIS — N186 End stage renal disease: Secondary | ICD-10-CM

## 2013-01-16 NOTE — Progress Notes (Signed)
Patient is a 67 year old female who presents for pain over the ulnar aspect of her left forearm graft. She had angioplasty of the venous outflow by my partner Dr. Imogene Burn several months ago. She states that she has one painful nodule on the ulnar aspect of the graft.  She denies fever or chills. She denies any drainage. I spoke with the charge nurse at her dialysis center in [place] today. They have noted no changes in the flow of her access. Overall they have been satisfied the access is functioning.  Review of systems: She denies fever or chills. She denies shortness of breath. She denies chest pain.  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 There is no significant narrowing within the graft. There may be some recurrent narrowing of the venous outflow however the venous outflow seem to be performing well according to the charge nurse at her dialysis center.  Assessment: Functioning left forearm AV graft pain may be secondary to the air pseudoaneurysm  Plan: Avoid cannulation of pseudoaneurysm. If increased flow rates on dialysis are a problem we'll consider revision of the graft with possible replacement of the ulnar limb at the time of revision.  Fabienne Bruns, MD [facility] Office: [phone] Pager: [phone]

## 2013-02-13 ENCOUNTER — Ambulatory Visit: Payer: Self-pay | Admitting: Podiatrist

## 2013-04-08 ENCOUNTER — Encounter (HOSPITAL_COMMUNITY): Payer: Self-pay | Admitting: Emergency Medicine

## 2013-04-08 ENCOUNTER — Emergency Department (HOSPITAL_COMMUNITY)
Admission: EM | Admit: 2013-04-08 | Discharge: 2013-04-08 | Disposition: A | Discharge status: Home / Self Care | Payer: Medicare Other | Attending: Emergency Medicine | Admitting: Emergency Medicine

## 2013-04-08 ENCOUNTER — Emergency Department (HOSPITAL_COMMUNITY): Payer: Medicare Other

## 2013-04-08 DIAGNOSIS — R05 Cough: Secondary | ICD-10-CM

## 2013-04-08 DIAGNOSIS — Z9089 Acquired absence of other organs: Secondary | ICD-10-CM | POA: Insufficient documentation

## 2013-04-08 DIAGNOSIS — E039 Hypothyroidism, unspecified: Secondary | ICD-10-CM | POA: Insufficient documentation

## 2013-04-08 DIAGNOSIS — I120 Hypertensive chronic kidney disease with stage 5 chronic kidney disease or end stage renal disease: Secondary | ICD-10-CM | POA: Insufficient documentation

## 2013-04-08 DIAGNOSIS — Z8739 Personal history of other diseases of the musculoskeletal system and connective tissue: Secondary | ICD-10-CM | POA: Insufficient documentation

## 2013-04-08 DIAGNOSIS — Z8619 Personal history of other infectious and parasitic diseases: Secondary | ICD-10-CM | POA: Insufficient documentation

## 2013-04-08 DIAGNOSIS — N186 End stage renal disease: Secondary | ICD-10-CM | POA: Insufficient documentation

## 2013-04-08 DIAGNOSIS — Z79899 Other long term (current) drug therapy: Secondary | ICD-10-CM | POA: Insufficient documentation

## 2013-04-08 DIAGNOSIS — Z872 Personal history of diseases of the skin and subcutaneous tissue: Secondary | ICD-10-CM | POA: Insufficient documentation

## 2013-04-08 DIAGNOSIS — Z8719 Personal history of other diseases of the digestive system: Secondary | ICD-10-CM | POA: Insufficient documentation

## 2013-04-08 DIAGNOSIS — Z87442 Personal history of urinary calculi: Secondary | ICD-10-CM | POA: Insufficient documentation

## 2013-04-08 DIAGNOSIS — Z8669 Personal history of other diseases of the nervous system and sense organs: Secondary | ICD-10-CM | POA: Insufficient documentation

## 2013-04-08 DIAGNOSIS — J3489 Other specified disorders of nose and nasal sinuses: Secondary | ICD-10-CM | POA: Insufficient documentation

## 2013-04-08 DIAGNOSIS — Z992 Dependence on renal dialysis: Secondary | ICD-10-CM | POA: Insufficient documentation

## 2013-04-08 DIAGNOSIS — R059 Cough, unspecified: Secondary | ICD-10-CM | POA: Insufficient documentation

## 2013-04-08 DIAGNOSIS — Z862 Personal history of diseases of the blood and blood-forming organs and certain disorders involving the immune mechanism: Secondary | ICD-10-CM | POA: Insufficient documentation

## 2013-04-08 MED ORDER — LEVOFLOXACIN 250 MG PO TABS: ORAL_TABLET | ORAL | Status: DC

## 2013-04-08 NOTE — ED Notes (Signed)
Patient being taken from here to her dialysis center by grandaughter

## 2013-04-08 NOTE — ED Provider Notes (Signed)
CSN: [id number]     Arrival date & time 04/08/13  1210 History   First MD Initiated Contact with Patient 04/08/13 1225     Chief Complaint  Patient presents with  . Cough   (Consider location/radiation/quality/duration/timing/severity/associated sxs/prior Treatment) Patient is a 67 y.o. female presenting with cough. The history is provided by the patient.  Cough Associated symptoms: no chest pain, no chills, no fever, no headaches, no rash, no shortness of breath and no sore throat   pt w hx esrd on hd, c/o non productive cough and congestion for the past few days. Denies fever or chills. Denies chest pain or sob. States went for her normal dialyses today, mentioned above symptoms, so they sent to ED (and did not get dialyzed today).  Pt denies known ill contacts. No leg swelling or pain. No orthopnea. Denies change in meds or new meds.      Past Medical History  Diagnosis Date  . Shoulder pain   . Headache(784.0)     occasionally  . Dizziness   . Occasional numbness/prickling/tingling of fingers and toes   . Arthritis     left shoulder  . Joint pain   . Joint swelling   . Gastric ulcer   . Dry skin   . Peripheral edema   . Constipation   . Oligouria   . H/O: GI bleed   . Hepatitis     Hep B  . History of kidney stones   . History of blood transfusion   . Hypertension   . Anemia   . Hypothyroidism     takes Synthroid daily  . Impaired hearing   . Diabetes mellitus     borderline  . Renal disorder     m, w, F    Past Surgical History  Procedure Laterality Date  . Shoulder surgery  3-46yrs ago    right replacement  . Esophagogastroduodenoscopy  06/09/2011    Procedure: ESOPHAGOGASTRODUODENOSCOPY (EGD);  Surgeon: Theda Belfast, MD;  Location: [facility] ENDOSCOPY;  Service: Endoscopy;  Laterality: N/A;  . Cataract surgery      bilateral  . Reverse shoulder arthroplasty  09/22/2011    Procedure: REVERSE SHOULDER ARTHROPLASTY;  Surgeon: Verlee Rossetti, MD;  Location: [facility] OR;   Service: Orthopedics;  Laterality: Left;  left reverse shoulder arthroplasty  . Cholecystectomy  12/26/2011  . Cholecystectomy  12/26/2011    Procedure: LAPAROSCOPIC CHOLECYSTECTOMY;  Surgeon: Atilano Ina, MD,FACS;  Location: [facility] OR;  Service: General;  Laterality: N/A;   History reviewed. No pertinent family history. History  Substance Use Topics  . Smoking status: Never Smoker   . Smokeless tobacco: Current User    Types: Chew  . Alcohol Use: No     Comment: quit 4-35yrs ago   OB History   Grav Para Term Preterm Abortions TAB SAB Ect Mult Living                 Review of Systems  Constitutional: Negative for fever and chills.  HENT: Negative for sore throat.   Eyes: Negative for redness.  Respiratory: Positive for cough. Negative for shortness of breath.   Cardiovascular: Negative for chest pain and leg swelling.  Gastrointestinal: Negative for vomiting and abdominal pain.  Genitourinary: Negative for flank pain.  Musculoskeletal: Negative for back pain and neck pain.  Skin: Negative for rash.  Neurological: Negative for headaches.  Hematological: Does not bruise/bleed easily.  Psychiatric/Behavioral: Negative for confusion.    Allergies  Banana; Chocolate; and Fish-derived  products  Home Medications   Current Outpatient Rx  Name  Route  Sig  Dispense  Refill  . calcium acetate (PHOSLO) 667 MG capsule   Oral   Take 1,334 mg by mouth 3 (three) times daily with meals.         . diclofenac sodium (VOLTAREN) 1 % GEL   Topical   Apply 2 g topically daily as needed. For pain         . gabapentin (NEURONTIN) 100 MG capsule   Oral   Take 100 mg by mouth at bedtime.         Marland Kitchen levothyroxine (SYNTHROID, LEVOTHROID) 112 MCG tablet   Oral   Take 112 mcg by mouth daily before breakfast.         . midodrine (PROAMATINE) 10 MG tablet   Oral   Take 1 tablet (10 mg total) by mouth 3 (three) times daily with meals.   90 tablet   0   . omeprazole (PRILOSEC) 20 MG  capsule   Oral   Take 20 mg by mouth daily.         . benzonatate (TESSALON) 100 MG capsule   Oral   Take 100 mg by mouth 3 (three) times daily as needed for cough.          BP 188/87  Pulse 81  Temp(Src) 98 F (36.7 C) (Oral)  Resp 18  SpO2 100% Physical Exam  Nursing note and vitals reviewed. Constitutional: She appears well-developed and well-nourished. No distress.  HENT:  Mouth/Throat: Oropharynx is clear and moist.  Eyes: Conjunctivae are normal. No scleral icterus.  Neck: Neck supple. No tracheal deviation present.  Cardiovascular: Normal rate, regular rhythm, normal heart sounds and intact distal pulses.   Pulmonary/Chest: Effort normal and breath sounds normal. No respiratory distress.  Abdominal: Soft. Normal appearance and bowel sounds are normal. She exhibits no distension. There is no tenderness.  Musculoskeletal: She exhibits no edema.  Left forearm dialyses access w palp thrill  Neurological: She is alert.  Skin: Skin is warm and dry. No rash noted.  Psychiatric: She has a normal mood and affect.    ED Course  Procedures (including critical care time)   Dg Chest 2 View  04/08/2013   CLINICAL DATA:  Cough, congestion, shortness of Breath  EXAM: CHEST  2 VIEW  COMPARISON:  04/08/2012  FINDINGS: Cardiomegaly is noted. Central vascular congestion without convincing pulmonary edema. Hazy right basilar atelectasis or infiltrate. Bilateral shoulder prosthesis.  IMPRESSION: Central vascular congestion without convincing pulmonary edema. Hazy right basilar atelectasis or infiltrate. Bilateral shoulder prosthesis.   Electronically Signed   By: Natasha Mead M.D.   On: 04/08/2013 13:31    \ Date: 04/08/2013  Rate: 75  Rhythm: normal sinus rhythm  QRS Axis: normal  Intervals: normal  ST/T Wave abnormalities: nonspecific ST/T changes  Conduction Disutrbances:none  Narrative Interpretation:   Old EKG Reviewed: unchanged   MDM  Labs. Cxr.  Reviewed nursing  notes and prior charts for additional history.   Pt with non productive cough.   Discussed w renal team - renal pa saw pt in ed, states pt well known to her.  [facility] has arranged for pt to get dialyses at her center today/now.  ?early/mild infiltrate on cxr. Given recent cough, ?infil on cxr, will rx levaquin fopr possible early pna.   Recheck pt, alert, content. No increased wob. Appears stable to go to her normal dialyses.     Suzi Roots,  MD 04/08/13 1408

## 2013-04-08 NOTE — ED Notes (Signed)
Nephrologist at bedside

## 2013-04-08 NOTE — ED Notes (Signed)
Pt in via EMS c/o cough and congestion since Sunday, patient went to dialysis today and when she told them she hadn't been feeling well they called EMS, patient did not get dialysis, patient states cough is dry and denies fever at home, increased shortness of breath with exertion, no distress noted upon arrival

## 2013-10-06 ENCOUNTER — Encounter (HOSPITAL_COMMUNITY): Payer: Self-pay | Admitting: Emergency Medicine

## 2013-10-06 ENCOUNTER — Emergency Department (HOSPITAL_COMMUNITY)
Admission: EM | Admit: 2013-10-06 | Discharge: 2013-10-06 | Disposition: A | Discharge status: Home / Self Care | Payer: Medicare Other | Attending: Emergency Medicine | Admitting: Emergency Medicine

## 2013-10-06 DIAGNOSIS — IMO0002 Reserved for concepts with insufficient information to code with codable children: Secondary | ICD-10-CM | POA: Insufficient documentation

## 2013-10-06 DIAGNOSIS — Z9089 Acquired absence of other organs: Secondary | ICD-10-CM | POA: Diagnosis not present

## 2013-10-06 DIAGNOSIS — M129 Arthropathy, unspecified: Secondary | ICD-10-CM | POA: Insufficient documentation

## 2013-10-06 DIAGNOSIS — Z87442 Personal history of urinary calculi: Secondary | ICD-10-CM | POA: Insufficient documentation

## 2013-10-06 DIAGNOSIS — Z862 Personal history of diseases of the blood and blood-forming organs and certain disorders involving the immune mechanism: Secondary | ICD-10-CM | POA: Diagnosis not present

## 2013-10-06 DIAGNOSIS — I120 Hypertensive chronic kidney disease with stage 5 chronic kidney disease or end stage renal disease: Secondary | ICD-10-CM | POA: Insufficient documentation

## 2013-10-06 DIAGNOSIS — Z79899 Other long term (current) drug therapy: Secondary | ICD-10-CM | POA: Insufficient documentation

## 2013-10-06 DIAGNOSIS — K5900 Constipation, unspecified: Secondary | ICD-10-CM | POA: Insufficient documentation

## 2013-10-06 DIAGNOSIS — T829XXA Unspecified complication of cardiac and vascular prosthetic device, implant and graft, initial encounter: Secondary | ICD-10-CM

## 2013-10-06 DIAGNOSIS — E119 Type 2 diabetes mellitus without complications: Secondary | ICD-10-CM | POA: Diagnosis not present

## 2013-10-06 DIAGNOSIS — Z872 Personal history of diseases of the skin and subcutaneous tissue: Secondary | ICD-10-CM | POA: Insufficient documentation

## 2013-10-06 DIAGNOSIS — H9190 Unspecified hearing loss, unspecified ear: Secondary | ICD-10-CM | POA: Insufficient documentation

## 2013-10-06 DIAGNOSIS — E039 Hypothyroidism, unspecified: Secondary | ICD-10-CM | POA: Insufficient documentation

## 2013-10-06 DIAGNOSIS — R58 Hemorrhage, not elsewhere classified: Secondary | ICD-10-CM

## 2013-10-06 DIAGNOSIS — Z791 Long term (current) use of non-steroidal anti-inflammatories (NSAID): Secondary | ICD-10-CM | POA: Diagnosis not present

## 2013-10-06 DIAGNOSIS — Y841 Kidney dialysis as the cause of abnormal reaction of the patient, or of later complication, without mention of misadventure at the time of the procedure: Secondary | ICD-10-CM | POA: Insufficient documentation

## 2013-10-06 DIAGNOSIS — Z9889 Other specified postprocedural states: Secondary | ICD-10-CM | POA: Diagnosis not present

## 2013-10-06 DIAGNOSIS — N186 End stage renal disease: Secondary | ICD-10-CM | POA: Insufficient documentation

## 2013-10-06 LAB — CBC
HCT: 31.7 % — ABNORMAL LOW (ref 36.0–46.0)
HEMOGLOBIN: 11 g/dL — AB (ref 12.0–15.0)
MCH: 31.7 pg (ref 26.0–34.0)
MCHC: 34.7 g/dL (ref 30.0–36.0)
MCV: 91.4 fL (ref 78.0–100.0)
PLATELETS: 104 10*3/uL — AB (ref 150–400)
RBC: 3.47 MIL/uL — AB (ref 3.87–5.11)
RDW: 15.6 % — ABNORMAL HIGH (ref 11.5–15.5)
WBC: 2.4 10*3/uL — ABNORMAL LOW (ref 4.0–10.5)

## 2013-10-06 NOTE — ED Notes (Signed)
Pt in via EMS, states this evening pt removed the tape that was on her dialysis graft after her treatment today, states the tape pulled the skin apart and area started bleeding, lasted approx 20 min prior to EMS arriving and area was spurting blood, pressure dressing applied upon EMS arrival and pressure was held for 10 min, bleeding stopped, no active bleeding upon arrival, dressing clean and intact.

## 2013-10-06 NOTE — ED Provider Notes (Signed)
CSN: [id number]     Arrival date & time 10/06/13  2059 History   First MD Initiated Contact with Patient 10/06/13 2114     Chief Complaint  Patient presents with  . Vascular Access Problem     (Consider location/radiation/quality/duration/timing/severity/associated sxs/prior Treatment) Patient is a 68 y.o. female presenting with general illness.  Illness Location:  Left arm Quality:  Bleeding from dialysis access Severity:  Mild Onset quality:  Sudden Duration: 20 minutes. Progression:  Resolved Chronicity:  New Context:  After dialysis Relieved by:  Pressure dressing Associated symptoms: no abdominal pain, no chest pain, no congestion, no cough, no fever, no headaches, no nausea, no rash, no rhinorrhea, no shortness of breath and no wheezing     Past Medical History  Diagnosis Date  . Shoulder pain   . Headache(784.0)     occasionally  . Dizziness   . Occasional numbness/prickling/tingling of fingers and toes   . Arthritis     left shoulder  . Joint pain   . Joint swelling   . Gastric ulcer   . Dry skin   . Peripheral edema   . Constipation   . Oligouria   . H/O: GI bleed   . Hepatitis     Hep B  . History of kidney stones   . History of blood transfusion   . Hypertension   . Anemia   . Hypothyroidism     takes Synthroid daily  . Impaired hearing   . Diabetes mellitus     borderline  . Renal disorder     m, w, F    Past Surgical History  Procedure Laterality Date  . Shoulder surgery  3-8096yrs ago    right replacement  . Esophagogastroduodenoscopy  06/09/2011    Procedure: ESOPHAGOGASTRODUODENOSCOPY (EGD);  Surgeon: Theda BelfastPatrick D Hung, MD;  [facility];  Service: Endoscopy;  Laterality: N/A;  . Cataract surgery      bilateral  . Reverse shoulder arthroplasty  09/22/2011    Procedure: REVERSE SHOULDER ARTHROPLASTY;  Surgeon: Verlee RossettiSteven R Norris, MD;  [facility];  Service: Orthopedics;  Laterality: Left;  left reverse shoulder arthroplasty  .  Cholecystectomy  12/26/2011  . Cholecystectomy  12/26/2011    Procedure: LAPAROSCOPIC CHOLECYSTECTOMY;  Surgeon: Atilano InaEric M Wilson, MD,FACS;  Location: [facility] OR;  Service: General;  Laterality: N/A;   History reviewed. No pertinent family history. History  Substance Use Topics  . Smoking status: Never Smoker   . Smokeless tobacco: Current User    Types: Chew  . Alcohol Use: No     Comment: quit 4-5952yrs ago   OB History   Grav Para Term Preterm Abortions TAB SAB Ect Mult Living                 Review of Systems  Constitutional: Negative for fever and activity change.  HENT: Negative for congestion, facial swelling and rhinorrhea.   Eyes: Negative for discharge and redness.  Respiratory: Negative for cough, shortness of breath and wheezing.   Cardiovascular: Negative for chest pain and palpitations.       Bleeding from fistula  Gastrointestinal: Negative for nausea, abdominal pain and abdominal distention.  Endocrine: Negative for polydipsia and polyuria.  Genitourinary: Negative for dysuria and menstrual problem.  Musculoskeletal: Negative for back pain and joint swelling.  Skin: Negative for color change, rash and wound.  Neurological: Negative for dizziness, light-headedness and headaches.      Allergies  Banana; Chocolate; and Fish-derived products  Home Medications  Prior to Admission medications   Medication Sig Start Date End Date Taking? Authorizing Garren Greenman  acetaminophen (TYLENOL) 500 MG tablet Take 500 mg by mouth every 6 (six) hours as needed for moderate pain.   Yes Historical Tramaine Sauls, MD  amLODipine (NORVASC) 5 MG tablet Take 5 mg by mouth at bedtime.   Yes Historical Alok Minshall, MD  cinacalcet (SENSIPAR) 30 MG tablet Take 30 mg by mouth daily.   Yes Historical Alieu Finnigan, MD  levothyroxine (SYNTHROID, LEVOTHROID) 112 MCG tablet Take 112 mcg by mouth daily before breakfast.   Yes Historical Aerielle Stoklosa, MD  metoprolol tartrate (LOPRESSOR) 25 MG tablet Take 25 mg by mouth 2  (two) times daily.   Yes Historical Keni Elison, MD  naproxen sodium (ANAPROX) 220 MG tablet Take 220 mg by mouth daily as needed (for pain).   Yes Historical Nicholle Falzon, MD  omeprazole (PRILOSEC) 20 MG capsule Take 20 mg by mouth daily.   Yes Historical Alaija Ruble, MD   BP 123/68  Pulse 73  Temp(Src) 98.5 F (36.9 C) (Oral)  Resp 20  SpO2 100% Physical Exam  Nursing note and vitals reviewed. Constitutional: She is oriented to person, place, and time. She appears well-developed and well-nourished.  HENT:  Head: Normocephalic and atraumatic.  Eyes: Conjunctivae and EOM are normal. Right eye exhibits no discharge. Left eye exhibits no discharge.  Cardiovascular: Normal rate and regular rhythm.   Only has pulse in fistula, no thrill  Pulmonary/Chest: Effort normal. No respiratory distress.  Abdominal: Soft. She exhibits no distension.  Musculoskeletal: Normal range of motion. She exhibits no edema and no tenderness.  Neurological: She is alert and oriented to person, place, and time.  Skin: Skin is warm and dry. She is not diaphoretic.    ED Course  Procedures (including critical care time) Labs Review Labs Reviewed  CBC - Abnormal; Notable for the following:    WBC 2.4 (*)    RBC 3.47 (*)    Hemoglobin 11.0 (*)    HCT 31.7 (*)    RDW 15.6 (*)    Platelets 104 (*)    All other components within normal limits    Imaging Review No results found.   EKG Interpretation None      MDM   Final diagnoses:  Bleeding  Complication of vascular access for dialysis, initial encounter    68 yo F w/ blood loss after dialysis today from fistula site. Thinks she lost 'a lot' of blood but unsure of quantity. No symptoms of acute blood loss anemia. On exam, pink conjunctiva, good cap refill. Fistula with pulse but no thrill.  Will check cbc to ensure hb stable, will need US of fistula to ensure clot function with vascular follow up.  US not available tonight. Patient will follow up with  vasc surgeon this week. Spoke both daughter and son and they ensured she would get follow up. Hb stable, still not bleeding. D/c to care of children at bedside.    Marily MemosJason Mesner, MD 10/07/13 2134

## 2013-10-07 ENCOUNTER — Other Ambulatory Visit: Payer: Self-pay | Admitting: *Deleted

## 2013-10-07 ENCOUNTER — Encounter: Payer: Self-pay | Admitting: Vascular Surgery

## 2013-10-07 DIAGNOSIS — T82598A Other mechanical complication of other cardiac and vascular devices and implants, initial encounter: Secondary | ICD-10-CM

## 2013-10-08 ENCOUNTER — Ambulatory Visit (HOSPITAL_COMMUNITY)
Admission: RE | Admit: 2013-10-08 | Discharge: 2013-10-08 | Disposition: A | Discharge status: Home / Self Care | Payer: Medicare Other | Source: Ambulatory Visit | Attending: Vascular Surgery | Admitting: Vascular Surgery

## 2013-10-08 ENCOUNTER — Ambulatory Visit (INDEPENDENT_AMBULATORY_CARE_PROVIDER_SITE_OTHER): Payer: Medicare Other | Admitting: Vascular Surgery

## 2013-10-08 ENCOUNTER — Encounter: Payer: Self-pay | Admitting: Vascular Surgery

## 2013-10-08 VITALS — BP 123/59 | HR 64 | Ht <= 58 in | Wt 169.5 lb

## 2013-10-08 DIAGNOSIS — N186 End stage renal disease: Secondary | ICD-10-CM | POA: Diagnosis not present

## 2013-10-08 DIAGNOSIS — T82898A Other specified complication of vascular prosthetic devices, implants and grafts, initial encounter: Secondary | ICD-10-CM | POA: Insufficient documentation

## 2013-10-08 DIAGNOSIS — T82598A Other mechanical complication of other cardiac and vascular devices and implants, initial encounter: Secondary | ICD-10-CM

## 2013-10-08 DIAGNOSIS — Y841 Kidney dialysis as the cause of abnormal reaction of the patient, or of later complication, without mention of misadventure at the time of the procedure: Secondary | ICD-10-CM | POA: Insufficient documentation

## 2013-10-08 HISTORY — DX: Other mechanical complication of other cardiac and vascular devices and implants, initial encounter: T82.598A

## 2013-10-08 NOTE — Progress Notes (Signed)
   Patient name: Samantha [name] MRN: [id number] DOB: 07-Sep-1945 Sex: female  REASON FOR VISIT: Bleeding from left forearm AV  HPI: Samantha [name] is a 68 y.o. female who had a bleeding episode from her left forearm AV graft. She was seen in the emergency department and this stopped. She dialyzes on Monday Wednesdays and Fridays. She has a left forearm AV graft which is otherwise been functioning well. She most recently underwent venoplasty a year ago in 2014. She has had no further bleeding episodes. She denies fever or chills.  REVIEW OF SYSTEMS: Arly.Keller[X ] denotes positive finding; [  ] denotes negative finding  CARDIOVASCULAR:  [ ]  chest pain   [ ]  dyspnea on exertion    CONSTITUTIONAL:  [ ]  fever   [ ]  chills  PHYSICAL EXAM: Filed Vitals:   10/08/13 1034  BP: 123/59  Pulse: 64  Height: 4\' 10"  (1.473 m)  Weight: 169 lb 8 oz (76.885 kg)  SpO2: 100%   Body mass index is 35.44 kg/(m^2). GENERAL: The patient is a well-nourished female, in no acute distress. The vital signs are documented above. CARDIOVASCULAR: There is a regular rate and rhythm. PULMONARY: There is good air exchange bilaterally without wheezing or rales. Her left forearm graft has a good bruit and thrill. On the medial and lateral aspects of the graft there is slight aneurysmal degeneration of the graft. There is no signs of infection. There are no ulcers.  MEDICAL ISSUES:   END-STAGE RENAL DISEASE: He is reasonable to continue to use her graft and avoid the aneurysms if at all possible. These at this point are not especially large. If she does have continued enlargement of the graft then we could replace the arterial half of the graft in the future if needed. However currently the graft appears to be functioning well and there are no ulcers or wounds on her graft currently.   Gregery Walberg S [facility]: [phone]

## 2013-10-09 NOTE — ED Provider Notes (Signed)
This patient was seen in conjunction with the resident physician, Dr. Clayborne DanaMesner.  The documentation accurately reflects the patient's ED evaluation.  On my exam, the patient was in no distress, with no active bleeding.   Gerhard Munchobert Lockwood, MD 10/09/13 1122

## 2013-11-01 IMAGING — CT CT SHOULDER*L* W/O CM
3 of 4 series · 7 of 14 positions shown, 8 images · non-contrast
Comparison: Plain films left shoulder 03/16/2011.

CLINICAL DATA: Left shoulder pain.

CT OF THE LEFT SHOULDER WITHOUT CONTRAST
TECHNIQUE: Multidetector CT imaging was performed according to the
standard protocol. Multiplanar CT image reconstructions were also
generated.

[Series 102: left shouler bone · axial · 0.37mm/px · z∈[-220,-52]mm · 3 of 68 slices shown, 4 images]
[im 1/68  soft-tissue]
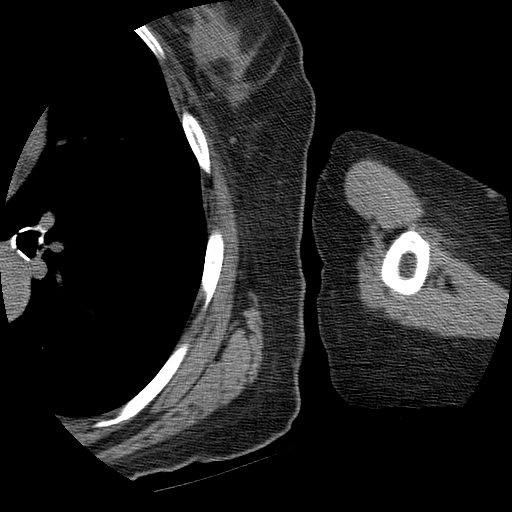
[im 1/68  bone]
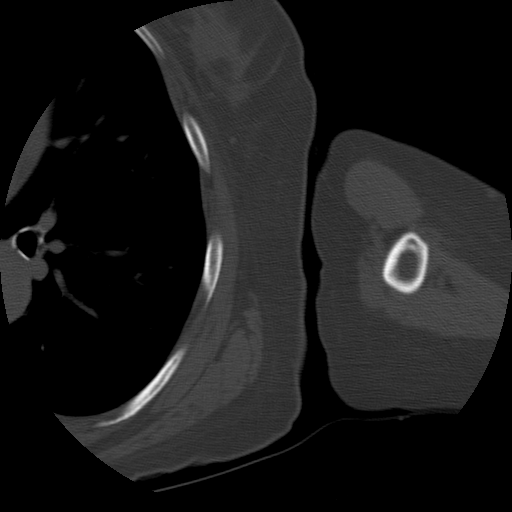
[im 34/68  bone]
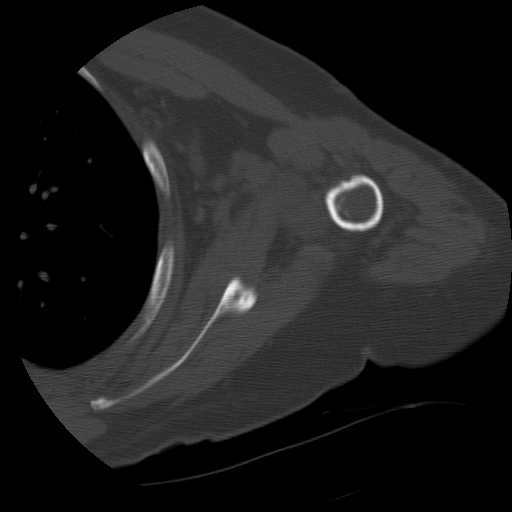
[im 68/68  bone]
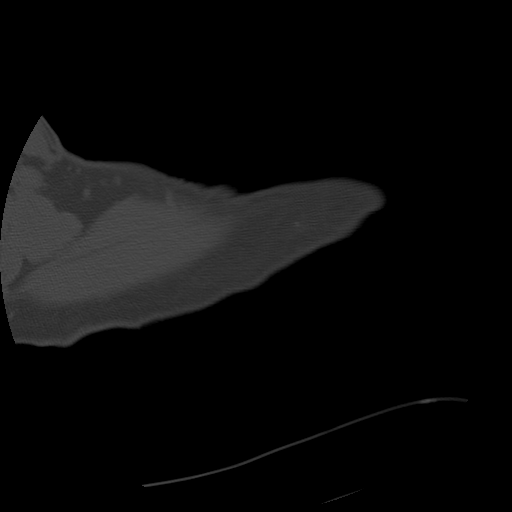

[Series 200: cor left shoulder · coronal · 0.37mm/px · 2 of 69 slices shown]
[im 23/69  bone]
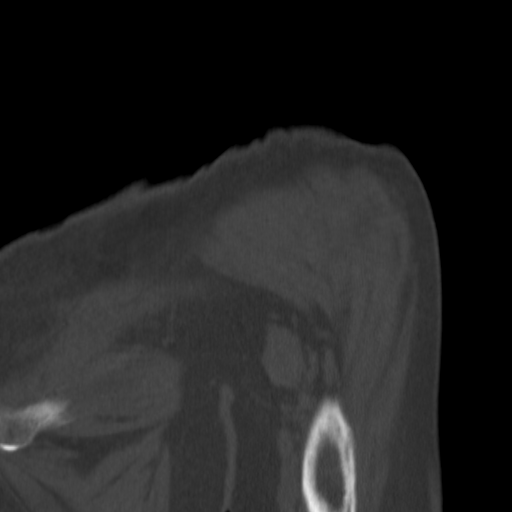
[im 46/69  bone]
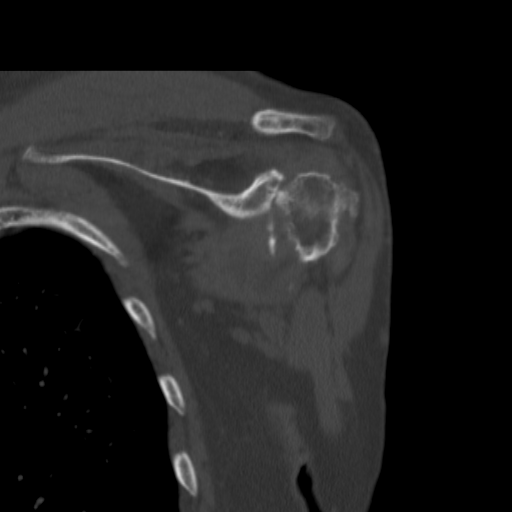

[Series 201: sag left shoulder · oblique · 0.37mm/px · 2 of 69 slices shown]
[im 23/69  bone]
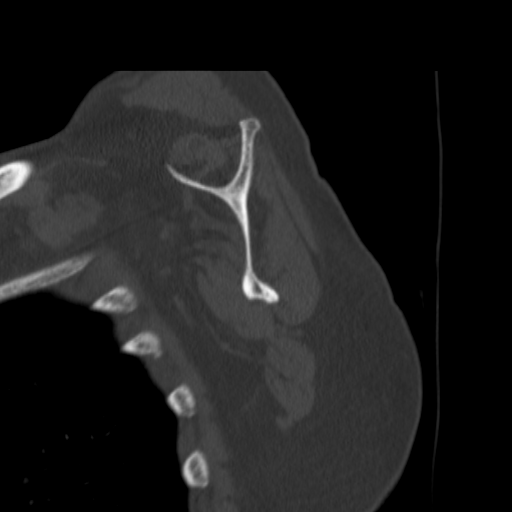
[im 46/69  bone]
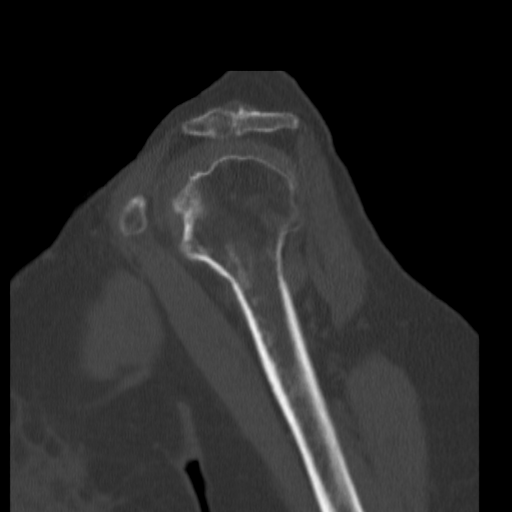

[7 of 14 positions shown; findings below may reference images not displayed]

FINDINGS: There is no fracture or dislocation.  The patient has
severe glenohumeral degenerative change.  The glenoid bone is
markedly remodeled.  An extensive portion of the superior glenoid
is severely thinned.  There is only mild acromioclavicular
degenerative disease.  As visualized by CT scan, the rotator cuff
appears intact.  Imaged lung parenchyma is clear.
IMPRESSION: Severe glenohumeral degenerative disease with marked remodeling and
thinning of the glenoid bone.

## 2014-02-12 ENCOUNTER — Ambulatory Visit: Payer: Self-pay | Admitting: Podiatrist

## 2014-03-26 ENCOUNTER — Encounter (HOSPITAL_COMMUNITY): Payer: Self-pay | Admitting: Vascular Surgery

## 2014-05-13 ENCOUNTER — Emergency Department (HOSPITAL_COMMUNITY)
Admission: EM | Admit: 2014-05-13 | Discharge: 2014-05-14 | Disposition: A | Discharge status: Home / Self Care | Payer: Medicare Other | Attending: Emergency Medicine | Admitting: Emergency Medicine

## 2014-05-13 ENCOUNTER — Emergency Department (HOSPITAL_COMMUNITY): Payer: Medicare Other

## 2014-05-13 ENCOUNTER — Encounter (HOSPITAL_COMMUNITY): Payer: Self-pay | Admitting: Emergency Medicine

## 2014-05-13 DIAGNOSIS — R1013 Epigastric pain: Secondary | ICD-10-CM | POA: Diagnosis not present

## 2014-05-13 DIAGNOSIS — Z79899 Other long term (current) drug therapy: Secondary | ICD-10-CM | POA: Diagnosis not present

## 2014-05-13 DIAGNOSIS — Z87442 Personal history of urinary calculi: Secondary | ICD-10-CM | POA: Diagnosis not present

## 2014-05-13 DIAGNOSIS — R748 Abnormal levels of other serum enzymes: Secondary | ICD-10-CM | POA: Insufficient documentation

## 2014-05-13 DIAGNOSIS — M13812 Other specified arthritis, left shoulder: Secondary | ICD-10-CM | POA: Diagnosis not present

## 2014-05-13 DIAGNOSIS — F419 Anxiety disorder, unspecified: Secondary | ICD-10-CM | POA: Insufficient documentation

## 2014-05-13 DIAGNOSIS — H9190 Unspecified hearing loss, unspecified ear: Secondary | ICD-10-CM | POA: Insufficient documentation

## 2014-05-13 DIAGNOSIS — Z8619 Personal history of other infectious and parasitic diseases: Secondary | ICD-10-CM | POA: Diagnosis not present

## 2014-05-13 DIAGNOSIS — E039 Hypothyroidism, unspecified: Secondary | ICD-10-CM | POA: Diagnosis not present

## 2014-05-13 DIAGNOSIS — N186 End stage renal disease: Secondary | ICD-10-CM | POA: Diagnosis not present

## 2014-05-13 DIAGNOSIS — Z862 Personal history of diseases of the blood and blood-forming organs and certain disorders involving the immune mechanism: Secondary | ICD-10-CM | POA: Insufficient documentation

## 2014-05-13 DIAGNOSIS — Z992 Dependence on renal dialysis: Secondary | ICD-10-CM | POA: Insufficient documentation

## 2014-05-13 DIAGNOSIS — Z9089 Acquired absence of other organs: Secondary | ICD-10-CM | POA: Diagnosis not present

## 2014-05-13 DIAGNOSIS — I120 Hypertensive chronic kidney disease with stage 5 chronic kidney disease or end stage renal disease: Secondary | ICD-10-CM | POA: Insufficient documentation

## 2014-05-13 DIAGNOSIS — Z8719 Personal history of other diseases of the digestive system: Secondary | ICD-10-CM | POA: Insufficient documentation

## 2014-05-13 LAB — COMPREHENSIVE METABOLIC PANEL
ALT: 13 U/L (ref 0–35)
ANION GAP: 16 — AB (ref 5–15)
AST: 39 U/L — ABNORMAL HIGH (ref 0–37)
Albumin: 3.3 g/dL — ABNORMAL LOW (ref 3.5–5.2)
Alkaline Phosphatase: 188 U/L — ABNORMAL HIGH (ref 39–117)
BILIRUBIN TOTAL: 1.3 mg/dL — AB (ref 0.3–1.2)
BUN: 7 mg/dL (ref 6–23)
CO2: 24 mmol/L (ref 19–32)
Calcium: 8.3 mg/dL — ABNORMAL LOW (ref 8.4–10.5)
Chloride: 94 mmol/L — ABNORMAL LOW (ref 96–112)
Creatinine, Ser: 5.12 mg/dL — ABNORMAL HIGH (ref 0.50–1.10)
GFR, EST AFRICAN AMERICAN: 9 mL/min — AB (ref >90)
GFR, EST NON AFRICAN AMERICAN: 8 mL/min — AB (ref >90)
Glucose, Bld: 89 mg/dL (ref 70–99)
Potassium: 3.8 mmol/L (ref 3.5–5.1)
Sodium: 134 mmol/L — ABNORMAL LOW (ref 135–145)
Total Protein: 8.7 g/dL — ABNORMAL HIGH (ref 6.0–8.3)

## 2014-05-13 LAB — I-STAT CHEM 8, ED
BUN: 5 mg/dL — ABNORMAL LOW (ref 6–23)
CHLORIDE: 96 mmol/L (ref 96–112)
Calcium, Ion: 0.86 mmol/L — ABNORMAL LOW (ref 1.13–1.30)
Creatinine, Ser: 4.2 mg/dL — ABNORMAL HIGH (ref 0.50–1.10)
GLUCOSE: 96 mg/dL (ref 70–99)
HCT: 42 % (ref 36.0–46.0)
Hemoglobin: 14.3 g/dL (ref 12.0–15.0)
POTASSIUM: 3.8 mmol/L (ref 3.5–5.1)
SODIUM: 135 mmol/L (ref 135–145)
TCO2: 25 mmol/L (ref 0–100)

## 2014-05-13 LAB — I-STAT TROPONIN, ED
TROPONIN I, POC: 0 ng/mL (ref 0.00–0.08)
TROPONIN I, POC: 0.01 ng/mL (ref 0.00–0.08)

## 2014-05-13 LAB — CBC
HEMATOCRIT: 38 % (ref 36.0–46.0)
Hemoglobin: 13.2 g/dL (ref 12.0–15.0)
MCH: 32.8 pg (ref 26.0–34.0)
MCHC: 34.7 g/dL (ref 30.0–36.0)
MCV: 94.3 fL (ref 78.0–100.0)
Platelets: 87 10*3/uL — ABNORMAL LOW (ref 150–400)
RBC: 4.03 MIL/uL (ref 3.87–5.11)
RDW: 15.4 % (ref 11.5–15.5)
WBC: 2.7 10*3/uL — ABNORMAL LOW (ref 4.0–10.5)

## 2014-05-13 LAB — LIPASE, BLOOD: LIPASE: 111 U/L — AB (ref 11–59)

## 2014-05-13 MED ORDER — IOHEXOL 300 MG/ML  SOLN
25.0000 mL | Freq: Once | INTRAMUSCULAR | Status: AC | PRN
  Administered 2014-05-13: 25 mL via ORAL

## 2014-05-13 NOTE — Discharge Instructions (Signed)
Return to the ED with any concerns including worsening abdominal pain, vomiting and not able to keep down liquids, fever/chills, decreased level of alertness/lethargy, or any other alarming symptoms  The CT scan showed possible mild inflammation of the intestine just past the stomach (duodenum)- if patient continues to have abdominal pain she should be evaluated further for this.  Arrange for followup with GI as well as your primary care doctor

## 2014-05-13 NOTE — ED Notes (Signed)
CT notified that the pt has finished drinking her contrast. Per Annabelle Harmanana in CT, pt will be brought over at 11.

## 2014-05-13 NOTE — ED Notes (Signed)
Pt given second cup of contrast.

## 2014-05-13 NOTE — ED Notes (Signed)
Per pt's family member, the second cup of contrast was spilled. This RN spoke with Annabelle HarmanDana in [place], who states that she will bring over another cup.

## 2014-05-13 NOTE — ED Provider Notes (Signed)
CSN: [id number]     Arrival date & time 05/13/14  1655 History   First MD Initiated Contact with Patient 05/13/14 1703     Chief Complaint  Patient presents with  . Abdominal Pain  . Shortness of Breath     (Consider location/radiation/quality/duration/timing/severity/associated sxs/prior Treatment) HPI  Pt presenting from dialysis with c/o epigastric pain and shrotness of breath.  Upon arrival to the ED she is not short of breath, but when IV is started she begins to have hyperventilation.  No chest pain.  She points to her epigastric region and states the pain comes and goes- states it has improved since arrival.  No vomiting or nausea.  No fever/chills.  There are no other associated systemic symptoms, there are no other alleviating or modifying factors.   Past Medical History  Diagnosis Date  . Shoulder pain   . Headache(784.0)     occasionally  . Dizziness   . Occasional numbness/prickling/tingling of fingers and toes   . Arthritis     left shoulder  . Joint pain   . Joint swelling   . Gastric ulcer   . Dry skin   . Peripheral edema   . Constipation   . Oligouria   . H/O: GI bleed   . Hepatitis     Hep B  . History of kidney stones   . History of blood transfusion   . Hypertension   . Anemia   . Hypothyroidism     takes Synthroid daily  . Impaired hearing   . Diabetes mellitus     borderline  . Renal disorder     m, w, F    Past Surgical History  Procedure Laterality Date  . Shoulder surgery  3-7yrs ago    right replacement  . Esophagogastroduodenoscopy  06/09/2011    Procedure: ESOPHAGOGASTRODUODENOSCOPY (EGD);  Surgeon: Theda Belfast, MD;  Location: [facility] ENDOSCOPY;  Service: Endoscopy;  Laterality: N/A;  . Cataract surgery      bilateral  . Reverse shoulder arthroplasty  09/22/2011    Procedure: REVERSE SHOULDER ARTHROPLASTY;  Surgeon: Verlee Rossetti, MD;  Location: [facility] OR;  Service: Orthopedics;  Laterality: Left;  left reverse shoulder arthroplasty  .  Cholecystectomy  12/26/2011  . Cholecystectomy  12/26/2011    Procedure: LAPAROSCOPIC CHOLECYSTECTOMY;  Surgeon: Atilano Ina, MD,FACS;  Location: [facility] OR;  Service: General;  Laterality: N/A;  . Arteriovenous graft placement Left     forearm  . Shuntogram Left 08/29/2012    Procedure: SHUNTOGRAM;  Surgeon: Fransisco Hertz, MD;  Location: [facility] CATH LAB;  Service: Cardiovascular;  Laterality: Left;  arm   History reviewed. No pertinent family history. History  Substance Use Topics  . Smoking status: Never Smoker   . Smokeless tobacco: Current User    Types: Chew  . Alcohol Use: No     Comment: quit 4-54yrs ago   OB History    No data available     Review of Systems  ROS reviewed and all otherwise negative except for mentioned in HPI    Allergies  Banana; Chocolate; and Fish-derived products  Home Medications   Prior to Admission medications   Medication Sig Start Date End Date Taking? Authorizing Provider  acetaminophen (TYLENOL) 500 MG tablet Take 500 mg by mouth every 6 (six) hours as needed for moderate pain.   Yes Historical Provider, MD  amLODipine (NORVASC) 5 MG tablet Take 5 mg by mouth at bedtime.   Yes Historical Provider, MD  cinacalcet (  SENSIPAR) 30 MG tablet Take 30 mg by mouth daily.   Yes Historical Provider, MD  levothyroxine (SYNTHROID, LEVOTHROID) 112 MCG tablet Take 112 mcg by mouth daily before breakfast.   Yes Historical Provider, MD  metoprolol tartrate (LOPRESSOR) 25 MG tablet Take 25 mg by mouth 2 (two) times daily.   Yes Historical Provider, MD  naproxen sodium (ANAPROX) 220 MG tablet Take 220 mg by mouth daily as needed (for pain).   Yes Historical Provider, MD  omeprazole (PRILOSEC) 20 MG capsule Take 20 mg by mouth daily.   Yes Historical Provider, MD  pantoprazole (PROTONIX) 40 MG tablet Take 40 mg by mouth daily. 05/02/14  Yes Historical Provider, MD   BP 93/44 mmHg  Pulse 73  Resp 12  SpO2 100%  Vitals reviewed Physical Exam  Physical Examination:  General appearance - alert, well appearing, and in no distress Mental status - alert, oriented to person, place, and time Eyes -  No conjunctival injection, no scleral icterus Mouth - mucous membranes moist, pharynx normal without lesions Chest - clear to auscultation, no wheezes, rales or rhonchi, symmetric air entry Heart - normal rate, regular rhythm, normal S1, S2, no murmurs, rubs, clicks or gallops Abdomen - soft, nontender, nondistended, no masses or organomegaly, nabs Extremities - peripheral pulses normal, no pedal edema, no clubbing or cyanosis, AV fistula in left upper extremity Skin - normal coloration and turgor, no rashes Psych- intermittently hyperventilating with IV start.   ED Course  Procedures (including critical care time) Labs Review Labs Reviewed  CBC - Abnormal; Notable for the following:    WBC 2.7 (*)    Platelets 87 (*)    All other components within normal limits  COMPREHENSIVE METABOLIC PANEL - Abnormal; Notable for the following:    Sodium 134 (*)    Chloride 94 (*)    Creatinine, Ser 5.12 (*)    Calcium 8.3 (*)    Total Protein 8.7 (*)    Albumin 3.3 (*)    AST 39 (*)    Alkaline Phosphatase 188 (*)    Total Bilirubin 1.3 (*)    GFR calc non Af Amer 8 (*)    GFR calc Af Amer 9 (*)    Anion gap 16 (*)    All other components within normal limits  LIPASE, BLOOD - Abnormal; Notable for the following:    Lipase 111 (*)    All other components within normal limits  I-STAT CHEM 8, ED - Abnormal; Notable for the following:    BUN 5 (*)    Creatinine, Ser 4.20 (*)    Calcium, Ion 0.86 (*)    All other components within normal limits  I-STAT TROPOININ, ED  I-STAT TROPOININ, ED    Imaging Review Ct Abdomen Pelvis Wo Contrast  05/13/2014   CLINICAL DATA:  Sudden onset of epigastric abdominal pain and shortness of breath. Symptoms developed during dialysis session. Initial encounter.  EXAM: CT ABDOMEN AND PELVIS WITHOUT CONTRAST  TECHNIQUE:  Multidetector CT imaging of the abdomen and pelvis was performed following the standard protocol without IV contrast.  COMPARISON:  CT of the abdomen and pelvis from 04/08/2012, and abdominal ultrasound performed 08/20/2012  FINDINGS: The visualized lung bases are clear.  The diffusely nodular contour of the liver is compatible with hepatic cirrhosis. The spleen is unremarkable in appearance. 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 The patient is status post cholecystectomy, with clips noted at the gallbladder fossa. The pancreas  and adrenal glands are unremarkable.  A 1.2 cm cyst is noted at the anterior aspect of the left kidney. The kidneys are markedly atrophic. There is no evidence of hydronephrosis. No renal or ureteral stones are seen. Mild nonspecific perinephric stranding is noted bilaterally.  No free fluid is identified. Mild apparent wall thickening is noted along the first segment of the duodenum. Would correlate for associated symptoms. The stomach is within normal limits. No acute vascular abnormalities are seen. Mild scattered calcification is seen along the abdominal aorta and its branches.  The appendix is normal in caliber and contains contrast and air, without evidence for appendicitis. Contrast progresses to the level of the rectum. The sigmoid colon is mildly redundant.  The bladder is relatively decompressed and not well assessed. The uterus is grossly unremarkable in appearance. The ovaries are relatively symmetric, with a likely benign 1.7 cm left adnexal cyst. No inguinal lymphadenopathy is seen.  New endplate irregularity and sclerosis is noted at L2-L3. Would correlate with the patient's symptoms to exclude discitis. This could reflect an acute erosive arthropathy.  IMPRESSION: 1. Mild apparent wall thickening noted along the first segment of the duodenum. This may simply reflect intraluminal contents, though would correlate for  associated symptoms and consider endoscopy for further evaluation, as deemed clinically appropriate. 2. New endplate irregularity and sclerosis at L2-L3. Would correlate with patient's symptoms to exclude discitis. Alternatively, this could reflect acute erosive arthropathy. 3. 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 4. Small left renal cyst seen.  Marked bilateral renal atrophy. 5. Mild scattered calcification along the abdominal aorta and its branches.   Electronically Signed   By: Roanna Raider M.D.   On: 05/13/2014 23:47   Dg Chest Portable 1 View  05/13/2014   CLINICAL DATA:  Shortness of breath and epigastric pain for 1 day.  EXAM: PORTABLE CHEST - 1 VIEW  COMPARISON:  04/08/2013.  FINDINGS: Trachea is midline. Heart size normal. Lungs are clear. No pleural fluid. Bilateral shoulder arthroplasties.  IMPRESSION: No acute findings.   Electronically Signed   By: Leanna Battles M.D.   On: 05/13/2014 18:23     EKG Interpretation None      Date: 05/13/2014  Rate: 72  Rhythm: normal sinus rhythm  QRS Axis: normal  Intervals: QT prolonged  ST/T Wave abnormalities: nonspecific ST/T changes  Conduction Disutrbances:none  Narrative Interpretation:   Old EKG Reviewed: none available  MDM   Final diagnoses:  Epigastric pain  Elevated lipase  Anxiety  End stage renal disease on dialysis    Pt presenting with c/o epigastric pain and shortness of breath at the end of her dialysis session today.  On arrival to the ED patient has no further abdominal pain- abdominal exam is benign.  Pt is intermittently short of breath- noted to occcur during blood draw/IV start- when not being stimulated patient is resting comfortably.  I have checked patient numerous times, d/w family, they are anxious to take patient home.  They state she has frequent somatic complaints and they feel she is currently at her baseline.  Blood pressure  runs low at baseline- was lower on arrival to the ED however this was directly after dialysis and resolved without treatment.  LFTs show elevation- pt has hx of cholecystectomy, also mild elevation of lipase- she has had this in the past- CT abdomen obtained.  Read as "possible mild" inflammation of duodenum- may be intraluminal contents-pt on recheck has no  further abdominal pain.  Discussed results with family and they are agreeable with arranging for outpatient GI appointment to further evaluate this finding if necessary.  As patient has no tenderness on exam and is otherwise at her baseline.  No chest pain.  Famiily is comfortable with plan for discharge- close followup with PMD.  Discharged with strict return precautions.  Pt agreeable with plan.    Ethelda Chick, MD 05/14/14 [phone]

## 2014-05-13 NOTE — ED Notes (Signed)
Pt from dialysis center via [facility] with c/o sudden onset epigastric pain and SOB with 24 minutes left on dialysis.  Pt reports the pain comes and goes but that "it's better now."  Denies N/V, chest pain.  EKG unremarkable.  Pt in NAD, A&O.

## 2014-05-13 NOTE — ED Notes (Signed)
IV team at bedside.  Will also attempt IV start for access.

## 2014-05-13 NOTE — ED Notes (Signed)
Dr Karma GanjaLinker notified of systolic BP in the 70s.

## 2014-05-13 NOTE — ED Notes (Signed)
Pt's family is requesting to see the doctor.

## 2014-05-13 NOTE — ED Notes (Signed)
Pt's family member came to nurse's station stating that the doctor has not come to see them.

## 2014-08-03 ENCOUNTER — Encounter (HOSPITAL_COMMUNITY): Payer: Self-pay | Admitting: Emergency Medicine

## 2014-08-03 ENCOUNTER — Emergency Department (HOSPITAL_COMMUNITY): Payer: Medicare Other

## 2014-08-03 ENCOUNTER — Emergency Department (HOSPITAL_COMMUNITY)
Admission: EM | Admit: 2014-08-03 | Discharge: 2014-08-03 | Disposition: A | Discharge status: Home / Self Care | Payer: Medicare Other | Attending: Emergency Medicine | Admitting: Emergency Medicine

## 2014-08-03 DIAGNOSIS — E119 Type 2 diabetes mellitus without complications: Secondary | ICD-10-CM | POA: Diagnosis not present

## 2014-08-03 DIAGNOSIS — Z9089 Acquired absence of other organs: Secondary | ICD-10-CM | POA: Diagnosis not present

## 2014-08-03 DIAGNOSIS — Z87442 Personal history of urinary calculi: Secondary | ICD-10-CM | POA: Insufficient documentation

## 2014-08-03 DIAGNOSIS — E039 Hypothyroidism, unspecified: Secondary | ICD-10-CM | POA: Insufficient documentation

## 2014-08-03 DIAGNOSIS — Z862 Personal history of diseases of the blood and blood-forming organs and certain disorders involving the immune mechanism: Secondary | ICD-10-CM | POA: Insufficient documentation

## 2014-08-03 DIAGNOSIS — Z992 Dependence on renal dialysis: Secondary | ICD-10-CM | POA: Insufficient documentation

## 2014-08-03 DIAGNOSIS — Z8619 Personal history of other infectious and parasitic diseases: Secondary | ICD-10-CM | POA: Insufficient documentation

## 2014-08-03 DIAGNOSIS — M19012 Primary osteoarthritis, left shoulder: Secondary | ICD-10-CM | POA: Diagnosis not present

## 2014-08-03 DIAGNOSIS — I120 Hypertensive chronic kidney disease with stage 5 chronic kidney disease or end stage renal disease: Secondary | ICD-10-CM | POA: Diagnosis not present

## 2014-08-03 DIAGNOSIS — Z8719 Personal history of other diseases of the digestive system: Secondary | ICD-10-CM | POA: Insufficient documentation

## 2014-08-03 DIAGNOSIS — R109 Unspecified abdominal pain: Secondary | ICD-10-CM | POA: Diagnosis present

## 2014-08-03 DIAGNOSIS — R1084 Generalized abdominal pain: Secondary | ICD-10-CM | POA: Insufficient documentation

## 2014-08-03 DIAGNOSIS — N186 End stage renal disease: Secondary | ICD-10-CM | POA: Insufficient documentation

## 2014-08-03 DIAGNOSIS — Z79899 Other long term (current) drug therapy: Secondary | ICD-10-CM | POA: Diagnosis not present

## 2014-08-03 DIAGNOSIS — H9190 Unspecified hearing loss, unspecified ear: Secondary | ICD-10-CM | POA: Diagnosis not present

## 2014-08-03 LAB — I-STAT CHEM 8, ED
BUN: 47 mg/dL — AB (ref 6–23)
CALCIUM ION: 0.76 mmol/L — AB (ref 1.13–1.30)
CREATININE: 9.6 mg/dL — AB (ref 0.50–1.10)
Chloride: 91 mmol/L — ABNORMAL LOW (ref 96–112)
GLUCOSE: 72 mg/dL (ref 70–99)
HCT: 38 % (ref 36.0–46.0)
Hemoglobin: 12.9 g/dL (ref 12.0–15.0)
Potassium: 4.3 mmol/L (ref 3.5–5.1)
SODIUM: 126 mmol/L — AB (ref 135–145)
TCO2: 17 mmol/L (ref 0–100)

## 2014-08-03 LAB — URINALYSIS, ROUTINE W REFLEX MICROSCOPIC
Glucose, UA: 500 mg/dL — AB
Ketones, ur: 15 mg/dL — AB
Nitrite: POSITIVE — AB
PH: 7 (ref 5.0–8.0)
Specific Gravity, Urine: 1.02 (ref 1.005–1.030)
Urobilinogen, UA: 4 mg/dL — ABNORMAL HIGH (ref 0.0–1.0)

## 2014-08-03 LAB — CBC WITH DIFFERENTIAL/PLATELET
Basophils Absolute: 0 10*3/uL (ref 0.0–0.1)
Basophils Relative: 1 % (ref 0–1)
EOS ABS: 0.1 10*3/uL (ref 0.0–0.7)
EOS PCT: 3 % (ref 0–5)
HEMATOCRIT: 38.6 % (ref 36.0–46.0)
HEMOGLOBIN: 13 g/dL (ref 12.0–15.0)
LYMPHS PCT: 29 % (ref 12–46)
Lymphs Abs: 0.9 10*3/uL (ref 0.7–4.0)
MCH: 33.2 pg (ref 26.0–34.0)
MCHC: 33.7 g/dL (ref 30.0–36.0)
MCV: 98.7 fL (ref 78.0–100.0)
MONOS PCT: 7 % (ref 3–12)
Monocytes Absolute: 0.2 10*3/uL (ref 0.1–1.0)
Neutro Abs: 1.8 10*3/uL (ref 1.7–7.7)
Neutrophils Relative %: 59 % (ref 43–77)
Platelets: 98 10*3/uL — ABNORMAL LOW (ref 150–400)
RBC: 3.91 MIL/uL (ref 3.87–5.11)
RDW: 16.4 % — ABNORMAL HIGH (ref 11.5–15.5)
WBC: 3 10*3/uL — ABNORMAL LOW (ref 4.0–10.5)

## 2014-08-03 LAB — URINE MICROSCOPIC-ADD ON

## 2014-08-03 MED ORDER — ONDANSETRON HCL 4 MG/2ML IJ SOLN
4.0000 mg | Freq: Once | INTRAMUSCULAR | Status: AC
  Administered 2014-08-03: 4 mg via INTRAVENOUS
  Filled 2014-08-03: qty 2

## 2014-08-03 MED ORDER — IOHEXOL 300 MG/ML  SOLN
25.0000 mL | Freq: Once | INTRAMUSCULAR | Status: AC | PRN
  Administered 2014-08-03: 25 mL via ORAL

## 2014-08-03 MED ORDER — IOHEXOL 300 MG/ML  SOLN
80.0000 mL | Freq: Once | INTRAMUSCULAR | Status: AC | PRN
  Administered 2014-08-03: 80 mL via INTRAVENOUS

## 2014-08-03 MED ORDER — FENTANYL CITRATE (PF) 100 MCG/2ML IJ SOLN: 50.0000 ug | Freq: Once | INTRAMUSCULAR | Status: DC

## 2014-08-03 MED ORDER — FENTANYL CITRATE (PF) 100 MCG/2ML IJ SOLN
25.0000 ug | Freq: Once | INTRAMUSCULAR | Status: AC
Start: 2014-08-03 — End: 2014-08-03
  Administered 2014-08-03: 25 ug via INTRAVENOUS
  Filled 2014-08-03: qty 2

## 2014-08-03 MED ORDER — FENTANYL CITRATE (PF) 100 MCG/2ML IJ SOLN
25.0000 ug | Freq: Once | INTRAMUSCULAR | Status: AC
  Administered 2014-08-03: 25 ug via INTRAVENOUS
  Filled 2014-08-03: qty 2

## 2014-08-03 MED ORDER — HYDROCODONE-ACETAMINOPHEN 5-325 MG PO TABS: 2.0000 | ORAL_TABLET | ORAL | Status: DC | PRN

## 2014-08-03 MED ORDER — SODIUM CHLORIDE 0.9 % IV BOLUS (SEPSIS)
500.0000 mL | Freq: Once | INTRAVENOUS | Status: AC
  Administered 2014-08-03: 500 mL via INTRAVENOUS

## 2014-08-03 NOTE — ED Provider Notes (Signed)
Patient's care turned over to me at 4:30 PM by Trixie DredgeEmily West PA. She reports patient has been having abdominal pain she was seen at Dr. Haywood PaoHung's  office and sent to the emergency department for evaluation. There had been multiple issues involving labs. Patient has had four  lab draws. Apparently due to continued hemolysis I am unable to obtain a cmet.  I was able to obtain an i-STAT 8 potassium that was 4.3. Patient's BUN is  47 creatinine is 9.6. Sodium is low at 126. I am somewhat skeptical of the reliability of this specimen as well.  Clinically patient does not appear to have fluid overload. EKG is not consistent with hyperkalemia. I doubt hyponatremia.  At this point in patient's care,  patient and family are very frustrated and tired of needle sticks. After I reviewed CT scan I do not feel patient has an acute abdominal process. She is scheduled for dialysis at 11:00 in the morning. I think it is reasonable for patient to go home. I will give her prescription for hydrocodone if she has further abdominal pain. I have advised family to reschedule appointment with Dr. Elnoria HowardHung. She should see her medical doctor Dr. Reesa ChewAV BUERRE for recheck. On patient's physical reevaluation. She reports pain has resolved she complains of pain to arm at IV site. Patient's vitals are stable. She is awake and alert, abdomen is soft with no tenderness.  Patient is discharged in stable condition with a prescription for hydrocodone and plans for follow-up. I have explained to patient and son the problems that we have had in evaluating her and apologized for extended stay and IV sticks. Patient and son are comfortable with current treatment plan and follow-up. They agree to return to the emergency department if any further problems  Elson AreasLeslie K Sofia, PA-C 08/03/14 1810  Blane OharaJoshua Zavitz, MD 08/05/14 [phone]

## 2014-08-03 NOTE — ED Notes (Signed)
Labs hemolyzed. Pt will have to have lab drawn for fourth time. Family informed

## 2014-08-03 NOTE — Discharge Instructions (Signed)

## 2014-08-03 NOTE — ED Notes (Signed)
Called lab to notify that pt's lab has still not resulted. Lab states they just found tube.

## 2014-08-03 NOTE — ED Notes (Signed)
Spoke to lab who said labs needed to be reordered. Irving BurtonEmily, PA aware. Labs reordered.

## 2014-08-03 NOTE — ED Notes (Signed)
Repositioned BP cuff located on rt forearm.

## 2014-08-03 NOTE — ED Notes (Signed)
Pt complaining of abd pain off and on for about a week. Pt went to MD office for pain, they sent her here.  Pt has dialysis [facility]. Pt did not go to dialysis today., Denies N/V/D. BP 142/86, HR 94,

## 2014-08-03 NOTE — ED Provider Notes (Signed)
CSN: [id number]     Arrival date & time 08/03/14  1108 History   First MD Initiated Contact with Patient 08/03/14 1119     Chief Complaint  Patient presents with  . Abdominal Pain     (Consider location/radiation/quality/duration/timing/severity/associated sxs/prior Treatment) HPI Comments: History given by son-in-law who lives with patient.  Patient extremely hard of hearing, is awake and alert and seems to be paying attention, states she has pain, but cannot answer any questions.    Per son-in-law, patient with ESRD on dialysis (last dialysis was Friday, 3 days ago) has been complaining about abdominal pain x 1 week.  He gave her pepto bismol without improvement.  Has had a few episodes of vomiting in the past two days.  She is able to perform all bathroom duties on her own so he does not know about stool habits but thinks she had several stools yesterday.  She does produce urine.   Son-in-law does not think pt has had fever.    Pt sent to ED today from GI doctor, son-in-law unsure of doctor's name.  PCP Av[place]uere.   Patient is a 69 y.o. female presenting with abdominal pain. The history is provided by a relative. History limited by: Extremely hard of hearing.  Abdominal Pain   Past Medical History  Diagnosis Date  . Shoulder pain   . Headache(784.0)     occasionally  . Dizziness   . Occasional numbness/prickling/tingling of fingers and toes   . Arthritis     left shoulder  . Joint pain   . Joint swelling   . Gastric ulcer   . Dry skin   . Peripheral edema   . Constipation   . Oligouria   . H/O: GI bleed   . Hepatitis     Hep B  . History of kidney stones   . History of blood transfusion   . Hypertension   . Anemia   . Hypothyroidism     takes Synthroid daily  . Impaired hearing   . Diabetes mellitus     borderline  . Renal disorder     m, w, F    Past Surgical History  Procedure Laterality Date  . Shoulder surgery  3-63yrs ago    right replacement  .  Esophagogastroduodenoscopy  06/09/2011    Procedure: ESOPHAGOGASTRODUODENOSCOPY (EGD);  Surgeon: Theda Belfast, MD;  Location: [facility] ENDOSCOPY;  Service: Endoscopy;  Laterality: N/A;  . Cataract surgery      bilateral  . Reverse shoulder arthroplasty  09/22/2011    Procedure: REVERSE SHOULDER ARTHROPLASTY;  Surgeon: Verlee Rossetti, MD;  Location: [facility] OR;  Service: Orthopedics;  Laterality: Left;  left reverse shoulder arthroplasty  . Cholecystectomy  12/26/2011  . Cholecystectomy  12/26/2011    Procedure: LAPAROSCOPIC CHOLECYSTECTOMY;  Surgeon: Atilano Ina, MD,FACS;  Location: [facility] OR;  Service: General;  Laterality: N/A;  . Arteriovenous graft placement Left     forearm  . Shuntogram Left 08/29/2012    Procedure: SHUNTOGRAM;  Surgeon: Fransisco Hertz, MD;  Location: [facility] CATH LAB;  Service: Cardiovascular;  Laterality: Left;  arm   History reviewed. No pertinent family history. History  Substance Use Topics  . Smoking status: Never Smoker   . Smokeless tobacco: Current User    Types: Chew  . Alcohol Use: No     Comment: quit 4-75yrs ago   OB History    No data available     Review of Systems  Unable to perform ROS: Other  Gastrointestinal: Positive for abdominal pain.      Allergies  Banana; Chocolate; and Fish-derived products  Home Medications   Prior to Admission medications   Medication Sig Start Date End Date Taking? Authorizing Provider  acetaminophen (TYLENOL) 500 MG tablet Take 500 mg by mouth every 6 (six) hours as needed for moderate pain.    Historical Provider, MD  amLODipine (NORVASC) 5 MG tablet Take 5 mg by mouth at bedtime.    Historical Provider, MD  cinacalcet (SENSIPAR) 30 MG tablet Take 30 mg by mouth daily.    Historical Provider, MD  levothyroxine (SYNTHROID, LEVOTHROID) 112 MCG tablet Take 112 mcg by mouth daily before breakfast.    Historical Provider, MD  metoprolol tartrate (LOPRESSOR) 25 MG tablet Take 25 mg by mouth 2 (two) times daily.    Historical  Provider, MD  naproxen sodium (ANAPROX) 220 MG tablet Take 220 mg by mouth daily as needed (for pain).    Historical Provider, MD  omeprazole (PRILOSEC) 20 MG capsule Take 20 mg by mouth daily.    Historical Provider, MD  pantoprazole (PROTONIX) 40 MG tablet Take 40 mg by mouth daily. 05/02/14   Historical Provider, MD   BP 133/58 mmHg  Temp(Src) 97.8 F (36.6 C) (Oral)  Resp 13  Wt 168 lb (76.204 kg)  SpO2 100% Physical Exam  Constitutional: She appears well-developed and well-nourished. No distress.  HENT:  Head: Normocephalic and atraumatic.  Neck: Neck supple.  Cardiovascular: Normal rate and regular rhythm.   Left upper extremity fistula with thrill, bruit.   Pulmonary/Chest: Effort normal and breath sounds normal. No respiratory distress. She has no wheezes. She has no rales.  Abdominal: Soft. She exhibits no distension. There is tenderness in the right lower quadrant, epigastric area, left upper quadrant and left lower quadrant. There is no rebound and no guarding.  Neurological: She is alert.  Skin: She is not diaphoretic.  Nursing note and vitals reviewed.   ED Course  Procedures (including critical care time) Labs Review Labs Reviewed  CBC WITH DIFFERENTIAL/PLATELET - Abnormal; Notable for the following:    WBC 3.0 (*)    RDW 16.4 (*)    Platelets 98 (*)    All other components within normal limits  URINALYSIS, ROUTINE W REFLEX MICROSCOPIC - Abnormal; Notable for the following:    Color, Urine AMBER (*)    APPearance CLOUDY (*)    Glucose, UA 500 (*)    Hgb urine dipstick LARGE (*)    Bilirubin Urine MODERATE (*)    Ketones, ur 15 (*)    Protein, ur >300 (*)    Urobilinogen, UA 4.0 (*)    Nitrite POSITIVE (*)    Leukocytes, UA TRACE (*)    All other components within normal limits  URINE MICROSCOPIC-ADD ON - Abnormal; Notable for the following:    Squamous Epithelial / LPF FEW (*)    Bacteria, UA FEW (*)    Casts GRANULAR CAST (*)    All other components  within normal limits  COMPREHENSIVE METABOLIC PANEL  LIPASE, BLOOD    Imaging Review Ct Abdomen Pelvis W Contrast  08/03/2014   CLINICAL DATA:  Abdominal pain for 1 week  EXAM: CT ABDOMEN AND PELVIS WITH CONTRAST  TECHNIQUE: Multidetector CT imaging of the abdomen and pelvis was performed using the standard protocol following bolus administration of intravenous contrast.  CONTRAST:  80 mL Omnipaque 300  COMPARISON:  05/13/2014  FINDINGS: Lung bases are free of acute infiltrate or sizable effusion.  The liver demonstrates cirrhotic change. The gallbladder has been surgically removed. The spleen is mildly enlarged and there are changes consistent with perisplenic as well as perigastric and esophageal varices. A splenorenal shunt is noted. Native kidneys are small consistent with the given clinical history of end-stage renal disease. Cysts is again noted in the left kidney and stable. A loop graft is noted in the left arm.  Scanning into the pelvis reveals the bladder to be nearly decompressed. The uterus is within normal limits for the patient's age. The appendix is within normal limits. No significant lymphadenopathy is noted. Bony structures show chronic changes at L2-3.  IMPRESSION: Changes of cirrhosis with portal hypertension and multifocal varices as well as a spontaneous splenorenal shunt.  Other chronic changes as described above.  No acute abnormality is noted.   Electronically Signed   By: Alcide CleverMark  Lukens M.D.   On: 08/03/2014 15:24     EKG Interpretation None       ED ECG REPORT   Date: 08/03/2014  Rate: 90  Rhythm: normal sinus rhythm  QRS Axis: normal  Intervals: QT prolonged  ST/T Wave abnormalities: normal  Conduction Disutrbances:none  Narrative Interpretation:   Old EKG Reviewed: none available  I have personally reviewed the EKG tracing and agree with the computerized printout as noted.   Discussed pt with Dr Jodi MourningZavitz.   3:39 PM [facility] to schedule dialysis for tomorrow in the event that she is discharged today.  She has a spot tomorrow at 12:05pm.    Labs delayed due to initial reported hemolysis.  I have called the lab twice to follow up on pending CMP and lipase with no answer.  3:42 PM   4:03 PM Nurse was able to contact lab and was told the lab had the blood but did not have order for the blood.  It appears the labs were cancelled by the lab itself.    MDM   Final diagnoses:  Generalized abdominal pain    Afebrile nontoxic dialysis patient with 1 week of abdominal pain, 2 days N/V, unclear what has happened with bowel movements or urination as patient is extremely hard of hearing and cannot offer any history.  No vomiting or diarrhea while in the ED.  Labs pending at change of shift due to lab delay.  UA nitrite positive with only 0-2 WBC and few bacteria, culture pending. CT abd/pelvis negative for acute process.  Patient signed out to Langston MaskerKaren Sofia, PA-C, at change of shift pending chemistry panels.  Anticipate discharge home with pain medication and PCP/GI follow up if patient does not require urgent dialysis.  Outpatient dialysis set up for noon tomorrow.  Son-in-law aware.      Trixie Dredgemily Lorian Yaun, PA-C 08/03/14 1635  Blane OharaJoshua Zavitz, MD 08/05/14 [phone]

## 2014-08-05 LAB — URINE CULTURE
Colony Count: NO GROWTH
Culture: NO GROWTH

## 2014-09-11 ENCOUNTER — Encounter (HOSPITAL_COMMUNITY): Payer: Self-pay | Admitting: *Deleted

## 2014-09-11 ENCOUNTER — Emergency Department (HOSPITAL_COMMUNITY): Payer: Medicare Other

## 2014-09-11 ENCOUNTER — Emergency Department (HOSPITAL_COMMUNITY)
Admission: EM | Admit: 2014-09-11 | Discharge: 2014-09-11 | Disposition: A | Discharge status: Home / Self Care | Payer: Medicare Other | Attending: Emergency Medicine | Admitting: Emergency Medicine

## 2014-09-11 DIAGNOSIS — Z87442 Personal history of urinary calculi: Secondary | ICD-10-CM | POA: Insufficient documentation

## 2014-09-11 DIAGNOSIS — Z8619 Personal history of other infectious and parasitic diseases: Secondary | ICD-10-CM | POA: Insufficient documentation

## 2014-09-11 DIAGNOSIS — Z79899 Other long term (current) drug therapy: Secondary | ICD-10-CM | POA: Diagnosis not present

## 2014-09-11 DIAGNOSIS — H9190 Unspecified hearing loss, unspecified ear: Secondary | ICD-10-CM | POA: Diagnosis not present

## 2014-09-11 DIAGNOSIS — Z862 Personal history of diseases of the blood and blood-forming organs and certain disorders involving the immune mechanism: Secondary | ICD-10-CM | POA: Insufficient documentation

## 2014-09-11 DIAGNOSIS — N186 End stage renal disease: Secondary | ICD-10-CM | POA: Insufficient documentation

## 2014-09-11 DIAGNOSIS — M5431 Sciatica, right side: Secondary | ICD-10-CM | POA: Diagnosis not present

## 2014-09-11 DIAGNOSIS — M1990 Unspecified osteoarthritis, unspecified site: Secondary | ICD-10-CM | POA: Insufficient documentation

## 2014-09-11 DIAGNOSIS — Z992 Dependence on renal dialysis: Secondary | ICD-10-CM | POA: Diagnosis not present

## 2014-09-11 DIAGNOSIS — I120 Hypertensive chronic kidney disease with stage 5 chronic kidney disease or end stage renal disease: Secondary | ICD-10-CM | POA: Insufficient documentation

## 2014-09-11 DIAGNOSIS — E039 Hypothyroidism, unspecified: Secondary | ICD-10-CM | POA: Insufficient documentation

## 2014-09-11 DIAGNOSIS — M25551 Pain in right hip: Secondary | ICD-10-CM | POA: Diagnosis present

## 2014-09-11 DIAGNOSIS — R52 Pain, unspecified: Secondary | ICD-10-CM

## 2014-09-11 LAB — CBC WITH DIFFERENTIAL/PLATELET
BASOS ABS: 0 10*3/uL (ref 0.0–0.1)
BASOS PCT: 1 % (ref 0–1)
EOS PCT: 5 % (ref 0–5)
Eosinophils Absolute: 0.1 10*3/uL (ref 0.0–0.7)
HCT: 35 % — ABNORMAL LOW (ref 36.0–46.0)
HEMOGLOBIN: 11.7 g/dL — AB (ref 12.0–15.0)
LYMPHS ABS: 0.9 10*3/uL (ref 0.7–4.0)
Lymphocytes Relative: 38 % (ref 12–46)
MCH: 32.6 pg (ref 26.0–34.0)
MCHC: 33.4 g/dL (ref 30.0–36.0)
MCV: 97.5 fL (ref 78.0–100.0)
MONO ABS: 0.3 10*3/uL (ref 0.1–1.0)
Monocytes Relative: 14 % — ABNORMAL HIGH (ref 3–12)
NEUTROS ABS: 1 10*3/uL — AB (ref 1.7–7.7)
Neutrophils Relative %: 42 % — ABNORMAL LOW (ref 43–77)
Platelets: 63 10*3/uL — ABNORMAL LOW (ref 150–400)
RBC: 3.59 MIL/uL — AB (ref 3.87–5.11)
RDW: 15.2 % (ref 11.5–15.5)
WBC: 2.4 10*3/uL — AB (ref 4.0–10.5)

## 2014-09-11 LAB — BASIC METABOLIC PANEL
Anion gap: 15 (ref 5–15)
BUN: 38 mg/dL — AB (ref 6–20)
CALCIUM: 8.3 mg/dL — AB (ref 8.9–10.3)
CO2: 22 mmol/L (ref 22–32)
CREATININE: 7.99 mg/dL — AB (ref 0.44–1.00)
Chloride: 93 mmol/L — ABNORMAL LOW (ref 101–111)
GFR calc Af Amer: 5 mL/min — ABNORMAL LOW (ref >60)
GFR calc non Af Amer: 5 mL/min — ABNORMAL LOW (ref >60)
GLUCOSE: 85 mg/dL (ref 65–99)
Potassium: 4 mmol/L (ref 3.5–5.1)
Sodium: 130 mmol/L — ABNORMAL LOW (ref 135–145)

## 2014-09-11 MED ORDER — HYDROMORPHONE HCL 1 MG/ML IJ SOLN
2.0000 mg | Freq: Once | INTRAMUSCULAR | Status: DC
  Filled 2014-09-11: qty 2

## 2014-09-11 MED ORDER — HYDROCODONE-ACETAMINOPHEN 5-325 MG PO TABS: 2.0000 | ORAL_TABLET | ORAL | Status: DC | PRN

## 2014-09-11 MED ORDER — SODIUM CHLORIDE 0.9 % IV BOLUS (SEPSIS)
1000.0000 mL | Freq: Once | INTRAVENOUS | Status: AC
  Administered 2014-09-11: 1000 mL via INTRAVENOUS

## 2014-09-11 MED ORDER — KETOROLAC TROMETHAMINE 15 MG/ML IJ SOLN
15.0000 mg | Freq: Once | INTRAMUSCULAR | Status: AC
  Administered 2014-09-11: 15 mg via INTRAVENOUS
  Filled 2014-09-11: qty 1

## 2014-09-11 NOTE — ED Provider Notes (Signed)
CSN: [id number]     Arrival date & time 09/11/14  1144 History   First MD Initiated Contact with Patient 09/11/14 1154     Chief Complaint  Patient presents with  . Leg Pain  . Hip Pain      HPI Pt was at [facility] waiting for dialysis when she told someone there that her leg was hurting. She typically doesn't use a cane but she had to use one today in order to walk. She stated that her Right hip hurts down to her right foot. The staff at kidney center called EMS for further eval. Pt did not receive dialysis. VS are as follows:  Past Medical History  Diagnosis Date  . Shoulder pain   . Headache(784.0)     occasionally  . Dizziness   . Occasional numbness/prickling/tingling of fingers and toes   . Arthritis     left shoulder  . Joint pain   . Joint swelling   . Gastric ulcer   . Dry skin   . Peripheral edema   . Constipation   . Oligouria   . H/O: GI bleed   . Hepatitis     Hep B  . History of kidney stones   . History of blood transfusion   . Hypertension   . Anemia   . Hypothyroidism     takes Synthroid daily  . Impaired hearing   . Diabetes mellitus     borderline  . Renal disorder     m, w, F    Past Surgical History  Procedure Laterality Date  . Shoulder surgery  3-7657yrs ago    right replacement  . Esophagogastroduodenoscopy  06/09/2011    Procedure: ESOPHAGOGASTRODUODENOSCOPY (EGD);  Surgeon: Theda BelfastPatrick D Hung, MD;  [facility];  Service: Endoscopy;  Laterality: N/A;  . Cataract surgery      bilateral  . Reverse shoulder arthroplasty  09/22/2011    Procedure: REVERSE SHOULDER ARTHROPLASTY;  Surgeon: Verlee RossettiSteven R Norris, MD;  [facility];  Service: Orthopedics;  Laterality: Left;  left reverse shoulder arthroplasty  . Cholecystectomy  12/26/2011  . Cholecystectomy  12/26/2011    Procedure: LAPAROSCOPIC CHOLECYSTECTOMY;  Surgeon: Atilano InaEric M Wilson, MD,FACS;  Location: [facility] OR;  Service: General;  Laterality: N/A;  . Arteriovenous graft  placement Left     forearm  . Shuntogram Left 08/29/2012    Procedure: SHUNTOGRAM;  Surgeon: Fransisco HertzBrian L Chen, MD;  [facility];  Service: Cardiovascular;  Laterality: Left;  arm   No family history on file. History  Substance Use Topics  . Smoking status: Never Smoker   . Smokeless tobacco: Current User    Types: Chew  . Alcohol Use: No     Comment: quit 4-414yrs ago   OB History    No data available     Review of Systems    Allergies  Banana; Chocolate; and Fish-derived products  Home Medications   Prior to Admission medications   Medication Sig Start Date End Date Taking? Authorizing Provider  acetaminophen (TYLENOL) 500 MG tablet Take 500 mg by mouth every 6 (six) hours as needed for moderate pain.    Historical Provider, MD  HYDROcodone-acetaminophen (NORCO/VICODIN) 5-325 MG per tablet Take 2 tablets by mouth every 4 (four) hours as needed. 09/11/14   Nelva Nayobert Sharaya Boruff, MD  metoprolol tartrate (LOPRESSOR) 25 MG tablet Take 25 mg by mouth 2 (two) times daily.    Historical Provider, MD  naproxen sodium (ANAPROX) 220 MG tablet Take 220  mg by mouth daily as needed (for pain).    Historical Provider, MD  omeprazole (PRILOSEC) 20 MG capsule Take 20 mg by mouth daily.    Historical Provider, MD  pantoprazole (PROTONIX) 40 MG tablet Take 40 mg by mouth daily. 05/02/14   Historical Provider, MD   BP 115/58 mmHg  Pulse 88  Temp(Src) 98 F (36.7 C) (Oral)  Resp 18  SpO2 100% Physical Exam  Constitutional: She is oriented to person, place, and time. She appears well-developed and well-nourished. No distress.  HENT:  Head: Normocephalic and atraumatic.  Eyes: Pupils are equal, round, and reactive to light.  Neck: Normal range of motion.  Cardiovascular: Normal rate and intact distal pulses.   Pulmonary/Chest: No respiratory distress.  Abdominal: Normal appearance. She exhibits no distension.  Musculoskeletal:       Back:  Neurological: She is alert and oriented to person,  place, and time. No cranial nerve deficit.  Skin: Skin is warm and dry. No rash noted.  Psychiatric: She has a normal mood and affect. Her behavior is normal.  Nursing note and vitals reviewed.   ED Course  Procedures (including critical care time)  Medications  sodium chloride 0.9 % bolus 1,000 mL (0 mLs Intravenous Stopped 09/11/14 1421)  ketorolac (TORADOL) 15 MG/ML injection 15 mg (15 mg Intravenous Given 09/11/14 1419)    Labs Review Labs Reviewed  CBC WITH DIFFERENTIAL/PLATELET - Abnormal; Notable for the following:    WBC 2.4 (*)    RBC 3.59 (*)    Hemoglobin 11.7 (*)    HCT 35.0 (*)    Platelets 63 (*)    Neutrophils Relative % 42 (*)    Neutro Abs 1.0 (*)    Monocytes Relative 14 (*)    All other components within normal limits  BASIC METABOLIC PANEL - Abnormal; Notable for the following:    Sodium 130 (*)    Chloride 93 (*)    BUN 38 (*)    Creatinine, Ser 7.99 (*)    Calcium 8.3 (*)    GFR calc non Af Amer 5 (*)    GFR calc Af Amer 5 (*)    All other components within normal limits    Imaging Review Dg Lumbar Spine Complete  09/11/2014   CLINICAL DATA:  Right hip pain. Hemodialysis patient. No reported acute injury.  EXAM: LUMBAR SPINE - COMPLETE 4+ VIEW  COMPARISON:  Abdominal pelvic CT 08/03/2014.  FINDINGS: There are 5 lumbar type vertebral bodies. The alignment is stable. There is stable disc space loss and chronic endplate irregularity at L2-3. Abdominal pelvic calcifications are grossly stable, corresponding with phleboliths on prior CT.  IMPRESSION: Stable chronic spondylosis at L2-3.  No acute osseous findings.   Electronically Signed   By: Carey Bullocks M.D.   On: 09/11/2014 13:40   Dg Hip Unilat With Pelvis 2-3 Views Right  09/11/2014   CLINICAL DATA:  Right hip pain  EXAM: RIGHT HIP (WITH PELVIS) 2-3 VIEWS  COMPARISON:  None  FINDINGS: Mild diffuse osteopenia. There is no evidence of hip fracture or dislocation. There is no evidence of arthropathy or  other focal bone abnormality.  IMPRESSION: 1. No acute findings. 2. If there is high clinical suspicion for occult fracture or the patient refuses to weightbear, consider further evaluation with MRI. Although CT is expeditious, evidence is lacking regarding accuracy of CT over plain film radiography.   Electronically Signed   By: Signa Kell M.D.   On: 09/11/2014 13:39  After Toradol patient looks much more comfortable sleeping with no significant pain.  The dialysis center said she could dialyze tomorrow.  Her potassium was checked and was normal today. MDM   Final diagnoses:  Pain  Sciatica, right  End stage renal disease        Nelva Nay, MD 09/11/14 [phone]

## 2014-09-11 NOTE — ED Notes (Signed)
NAD at this time. Pt is stable and leaving with her daughter. 

## 2014-09-11 NOTE — Discharge Instructions (Signed)
Sciatica °Sciatica is pain, weakness, numbness, or tingling along your sciatic nerve. The nerve starts in the lower back and runs down the back of each leg. Nerve damage or certain conditions pinch or put pressure on the sciatic nerve. This causes the pain, weakness, and other discomforts of sciatica. °HOME CARE  °· Only take medicine as told by your doctor. °· Apply ice to the affected area for 20 minutes. Do this 3-4 times a day for the first 48-72 hours. Then try heat in the same way. °· Exercise, stretch, or do your usual activities if these do not make your pain worse. °· Go to physical therapy as told by your doctor. °· Keep all doctor visits as told. °· Do not wear high heels or shoes that are not supportive. °· Get a firm mattress if your mattress is too soft to lessen pain and discomfort. °GET HELP RIGHT AWAY IF:  °· You cannot control when you poop (bowel movement) or pee (urinate). °· You have more weakness in your lower back, lower belly (pelvis), butt (buttocks), or legs. °· You have redness or puffiness (swelling) of your back. °· You have a burning feeling when you pee. °· You have pain that gets worse when you lie down. °· You have pain that wakes you from your sleep. °· Your pain is worse than past pain. °· Your pain lasts longer than 4 weeks. °· You are suddenly losing weight without reason. °MAKE SURE YOU:  °· Understand these instructions. °· Will watch this condition. °· Will get help right away if you are not doing well or get worse. °Document Released: 01/11/2008 Document Revised: 10/03/2011 Document Reviewed: 08/13/2011 °ExitCare® Patient Information ©2015 [employer]. This information is not intended to replace advice given to you by your health care provider. Make sure you discuss any questions you have with your health care provider. ° °

## 2014-09-11 NOTE — ED Notes (Signed)
Pt was at [facility] waiting for dialysis when she told someone there that her leg was hurting.  She typically doesn't use a cane but she had to use one today in order to walk.  She stated that her Right hip hurts down to her right foot. The staff at kidney center called EMS for further eval. Pt did not receive dialysis.  VS are as follows: BP:118/62 HR:74 Resp:18 O2sat 100% on RA CBG: 105

## 2014-12-25 ENCOUNTER — Telehealth: Payer: Self-pay

## 2014-12-25 ENCOUNTER — Encounter (HOSPITAL_COMMUNITY): Payer: Self-pay | Admitting: Emergency Medicine

## 2014-12-25 ENCOUNTER — Inpatient Hospital Stay (HOSPITAL_COMMUNITY)
Admission: EM | Admit: 2014-12-25 | Discharge: 2014-12-28 | DRG: 314 | Disposition: A | Discharge status: Home / Self Care | Payer: Medicare Other | Attending: Internal Medicine | Admitting: Internal Medicine

## 2014-12-25 ENCOUNTER — Emergency Department (HOSPITAL_COMMUNITY): Payer: Medicare Other

## 2014-12-25 DIAGNOSIS — B9620 Unspecified Escherichia coli [E. coli] as the cause of diseases classified elsewhere: Secondary | ICD-10-CM | POA: Diagnosis present

## 2014-12-25 DIAGNOSIS — F17220 Nicotine dependence, chewing tobacco, uncomplicated: Secondary | ICD-10-CM | POA: Diagnosis present

## 2014-12-25 DIAGNOSIS — N2581 Secondary hyperparathyroidism of renal origin: Secondary | ICD-10-CM | POA: Diagnosis present

## 2014-12-25 DIAGNOSIS — R7881 Bacteremia: Secondary | ICD-10-CM | POA: Insufficient documentation

## 2014-12-25 DIAGNOSIS — Z79899 Other long term (current) drug therapy: Secondary | ICD-10-CM

## 2014-12-25 DIAGNOSIS — B999 Unspecified infectious disease: Secondary | ICD-10-CM

## 2014-12-25 DIAGNOSIS — E1122 Type 2 diabetes mellitus with diabetic chronic kidney disease: Secondary | ICD-10-CM | POA: Diagnosis present

## 2014-12-25 DIAGNOSIS — Z8711 Personal history of peptic ulcer disease: Secondary | ICD-10-CM | POA: Diagnosis not present

## 2014-12-25 DIAGNOSIS — Z96611 Presence of right artificial shoulder joint: Secondary | ICD-10-CM | POA: Diagnosis present

## 2014-12-25 DIAGNOSIS — L03114 Cellulitis of left upper limb: Secondary | ICD-10-CM | POA: Diagnosis not present

## 2014-12-25 DIAGNOSIS — I517 Cardiomegaly: Secondary | ICD-10-CM | POA: Diagnosis present

## 2014-12-25 DIAGNOSIS — D631 Anemia in chronic kidney disease: Secondary | ICD-10-CM | POA: Diagnosis present

## 2014-12-25 DIAGNOSIS — T827XXA Infection and inflammatory reaction due to other cardiac and vascular devices, implants and grafts, initial encounter: Secondary | ICD-10-CM | POA: Diagnosis present

## 2014-12-25 DIAGNOSIS — Z7401 Bed confinement status: Secondary | ICD-10-CM | POA: Diagnosis not present

## 2014-12-25 DIAGNOSIS — Z79891 Long term (current) use of opiate analgesic: Secondary | ICD-10-CM | POA: Diagnosis not present

## 2014-12-25 DIAGNOSIS — Z91010 Allergy to peanuts: Secondary | ICD-10-CM

## 2014-12-25 DIAGNOSIS — Z992 Dependence on renal dialysis: Secondary | ICD-10-CM | POA: Diagnosis not present

## 2014-12-25 DIAGNOSIS — N186 End stage renal disease: Secondary | ICD-10-CM | POA: Diagnosis present

## 2014-12-25 DIAGNOSIS — T827XXD Infection and inflammatory reaction due to other cardiac and vascular devices, implants and grafts, subsequent encounter: Secondary | ICD-10-CM | POA: Diagnosis not present

## 2014-12-25 DIAGNOSIS — H9190 Unspecified hearing loss, unspecified ear: Secondary | ICD-10-CM | POA: Diagnosis present

## 2014-12-25 DIAGNOSIS — Z993 Dependence on wheelchair: Secondary | ICD-10-CM | POA: Diagnosis not present

## 2014-12-25 DIAGNOSIS — Z91018 Allergy to other foods: Secondary | ICD-10-CM

## 2014-12-25 DIAGNOSIS — K7469 Other cirrhosis of liver: Secondary | ICD-10-CM | POA: Diagnosis not present

## 2014-12-25 DIAGNOSIS — N189 Chronic kidney disease, unspecified: Secondary | ICD-10-CM

## 2014-12-25 DIAGNOSIS — K7460 Unspecified cirrhosis of liver: Secondary | ICD-10-CM | POA: Diagnosis present

## 2014-12-25 DIAGNOSIS — Y838 Other surgical procedures as the cause of abnormal reaction of the patient, or of later complication, without mention of misadventure at the time of the procedure: Secondary | ICD-10-CM | POA: Diagnosis present

## 2014-12-25 DIAGNOSIS — I10 Essential (primary) hypertension: Secondary | ICD-10-CM | POA: Diagnosis present

## 2014-12-25 DIAGNOSIS — D61818 Other pancytopenia: Secondary | ICD-10-CM | POA: Diagnosis present

## 2014-12-25 DIAGNOSIS — I959 Hypotension, unspecified: Secondary | ICD-10-CM | POA: Diagnosis present

## 2014-12-25 DIAGNOSIS — I120 Hypertensive chronic kidney disease with stage 5 chronic kidney disease or end stage renal disease: Secondary | ICD-10-CM | POA: Diagnosis present

## 2014-12-25 DIAGNOSIS — B1910 Unspecified viral hepatitis B without hepatic coma: Secondary | ICD-10-CM

## 2014-12-25 DIAGNOSIS — E039 Hypothyroidism, unspecified: Secondary | ICD-10-CM | POA: Diagnosis present

## 2014-12-25 DIAGNOSIS — D696 Thrombocytopenia, unspecified: Secondary | ICD-10-CM | POA: Diagnosis present

## 2014-12-25 DIAGNOSIS — K7030 Alcoholic cirrhosis of liver without ascites: Secondary | ICD-10-CM | POA: Diagnosis not present

## 2014-12-25 DIAGNOSIS — I340 Nonrheumatic mitral (valve) insufficiency: Secondary | ICD-10-CM | POA: Diagnosis not present

## 2014-12-25 LAB — CBC WITH DIFFERENTIAL/PLATELET
BASOS PCT: 1 % (ref 0–1)
Basophils Absolute: 0 10*3/uL (ref 0.0–0.1)
Eosinophils Absolute: 0.3 10*3/uL (ref 0.0–0.7)
Eosinophils Relative: 11 % — ABNORMAL HIGH (ref 0–5)
HCT: 30.6 % — ABNORMAL LOW (ref 36.0–46.0)
Hemoglobin: 10.8 g/dL — ABNORMAL LOW (ref 12.0–15.0)
Lymphocytes Relative: 38 % (ref 12–46)
Lymphs Abs: 0.9 10*3/uL (ref 0.7–4.0)
MCH: 34.3 pg — ABNORMAL HIGH (ref 26.0–34.0)
MCHC: 35.3 g/dL (ref 30.0–36.0)
MCV: 97.1 fL (ref 78.0–100.0)
Monocytes Absolute: 0.3 10*3/uL (ref 0.1–1.0)
Monocytes Relative: 11 % (ref 3–12)
Neutro Abs: 1 10*3/uL — ABNORMAL LOW (ref 1.7–7.7)
Neutrophils Relative %: 39 % — ABNORMAL LOW (ref 43–77)
Platelets: 71 10*3/uL — ABNORMAL LOW (ref 150–400)
RBC: 3.15 MIL/uL — ABNORMAL LOW (ref 3.87–5.11)
RDW: 15.5 % (ref 11.5–15.5)
WBC: 2.5 10*3/uL — ABNORMAL LOW (ref 4.0–10.5)

## 2014-12-25 LAB — BASIC METABOLIC PANEL
Anion gap: 13 (ref 5–15)
BUN: 47 mg/dL — ABNORMAL HIGH (ref 6–20)
CO2: 22 mmol/L (ref 22–32)
Calcium: 7.2 mg/dL — ABNORMAL LOW (ref 8.9–10.3)
Chloride: 97 mmol/L — ABNORMAL LOW (ref 101–111)
Creatinine, Ser: 8.72 mg/dL — ABNORMAL HIGH (ref 0.44–1.00)
GFR calc Af Amer: 5 mL/min — ABNORMAL LOW (ref >60)
GFR calc non Af Amer: 4 mL/min — ABNORMAL LOW (ref >60)
GLUCOSE: 73 mg/dL (ref 65–99)
POTASSIUM: 5.1 mmol/L (ref 3.5–5.1)
Sodium: 132 mmol/L — ABNORMAL LOW (ref 135–145)

## 2014-12-25 LAB — PROTIME-INR
INR: 1.44 (ref 0.00–1.49)
PROTHROMBIN TIME: 17.6 s — AB (ref 11.6–15.2)

## 2014-12-25 LAB — I-STAT CG4 LACTIC ACID, ED: LACTIC ACID, VENOUS: 2.02 mmol/L — AB (ref 0.5–2.0)

## 2014-12-25 MED ORDER — PIPERACILLIN-TAZOBACTAM IN DEX 2-0.25 GM/50ML IV SOLN
2.2500 g | Freq: Three times a day (TID) | INTRAVENOUS | Status: DC
  Administered 2014-12-26 – 2014-12-27 (×4): 2.25 g via INTRAVENOUS
  Filled 2014-12-25 (×7): qty 50

## 2014-12-25 MED ORDER — VANCOMYCIN HCL 1000 MG IV SOLR
750.0000 mg | INTRAVENOUS | Status: DC | PRN
  Administered 2014-12-26: 750 mg via INTRAVENOUS
  Filled 2014-12-25 (×4): qty 750

## 2014-12-25 MED ORDER — VANCOMYCIN HCL 10 G IV SOLR
1750.0000 mg | Freq: Once | INTRAVENOUS | Status: AC
  Administered 2014-12-25: 1750 mg via INTRAVENOUS
  Filled 2014-12-25: qty 1750

## 2014-12-25 MED ORDER — PIPERACILLIN-TAZOBACTAM 3.375 G IVPB 30 MIN
3.3750 g | Freq: Once | INTRAVENOUS | Status: AC
  Administered 2014-12-25: 3.375 g via INTRAVENOUS
  Filled 2014-12-25: qty 50

## 2014-12-25 NOTE — Consult Note (Addendum)
Hospital Consult   Reason for Consult:  Possible infected left arm graft Referring Physician:  ER MRN #:  [id number]   History of Present Illness: This is a 69 y.o. female who presented to HD today.  She did not get any HD as her blood cultures from 12/23/14 were reportedly positive and she was sent to the ER for a possible infected left arm graft.  The pt is a poor historian, but states that she has had pain in her arm for about a week.  There has not been any drainage from around the graft.  She is not sure if she has had any fevers at home.    She is right handed.  She dialyzes M/W/F.  Her daughter and son in law are at her bedside and tell me that she has had her left arm graft for about 4-5 years and it was placed in [place], [place].  It did require some maintenance work by Dr. Imogene Burn a couple of years ago, which included a venoplasty of the brachial vein x 2 (08/29/12).  On 10/08/13, she was seen in the office by Dr. Edilia Bo as the pt had been seen in the ER for bleeding from her graft.  At that time, he felt it was reasonable to continue to use the graft and avoid the aneurysms if at all possible.  At that time, they were not large.He also said if she did have continued enlargement of the graft then we could replace the arterial half of the graft in the future if needed.    The pt is on a beta blocker for blood pressure management.  She is on a PPI as she has a hx of gastric ulcer/hx of GIB in the past.   Past Medical History  Diagnosis Date  . Shoulder pain   . Headache(784.0)     occasionally  . Dizziness   . Occasional numbness/prickling/tingling of fingers and toes   . Arthritis     left shoulder  . Joint pain   . Joint swelling   . Gastric ulcer   . Dry skin   . Peripheral edema   . Constipation   . Oligouria   . H/O: GI bleed   . Hepatitis     Hep B  . History of kidney stones   . History of blood transfusion   . Hypertension   . Anemia   . Hypothyroidism     takes  Synthroid daily  . Impaired hearing   . Diabetes mellitus     borderline  . Renal disorder     m, w, F     Past Surgical History  Procedure Laterality Date  . Shoulder surgery  3-34yrs ago    right replacement  . Esophagogastroduodenoscopy  06/09/2011    Procedure: ESOPHAGOGASTRODUODENOSCOPY (EGD);  Surgeon: Theda Belfast, MD;  Location: [facility] ENDOSCOPY;  Service: Endoscopy;  Laterality: N/A;  . Cataract surgery      bilateral  . Reverse shoulder arthroplasty  09/22/2011    Procedure: REVERSE SHOULDER ARTHROPLASTY;  Surgeon: Verlee Rossetti, MD;  Location: [facility] OR;  Service: Orthopedics;  Laterality: Left;  left reverse shoulder arthroplasty  . Cholecystectomy  12/26/2011  . Cholecystectomy  12/26/2011    Procedure: LAPAROSCOPIC CHOLECYSTECTOMY;  Surgeon: Atilano Ina, MD,FACS;  Location: [facility] OR;  Service: General;  Laterality: N/A;  . Arteriovenous graft placement Left     forearm  . Shuntogram Left 08/29/2012    Procedure: Betsey Amen;  Surgeon: Arlys John  Rolena Infante, MD;  Location: [facility] CATH LAB;  Service: Cardiovascular;  Laterality: Left;  arm    Allergies  Allergen Reactions  . Banana Other (See Comments)    Due to dialysis  . Chocolate Other (See Comments)    Due to dialysis  . Fish-Derived Products Other (See Comments)    Due to gout    Prior to Admission medications   Medication Sig Start Date End Date Taking? Authorizing Provider  acetaminophen (TYLENOL) 500 MG tablet Take 500 mg by mouth every 6 (six) hours as needed for moderate pain.    Historical Provider, MD  HYDROcodone-acetaminophen (NORCO/VICODIN) 5-325 MG per tablet Take 2 tablets by mouth every 4 (four) hours as needed. 09/11/14   Nelva Nay, MD  metoprolol tartrate (LOPRESSOR) 25 MG tablet Take 25 mg by mouth 2 (two) times daily.    Historical Provider, MD  naproxen sodium (ANAPROX) 220 MG tablet Take 220 mg by mouth daily as needed (for pain).    Historical Provider, MD  omeprazole (PRILOSEC) 20 MG capsule Take 20 mg by  mouth daily.    Historical Provider, MD  pantoprazole (PROTONIX) 40 MG tablet Take 40 mg by mouth daily. 05/02/14   Historical Provider, MD    Social History   Social History  . Marital Status: Widowed    Spouse Name: N/A  . Number of Children: N/A  . Years of Education: N/A   Occupational History  . Not on file.   Social History Main Topics  . Smoking status: Never Smoker   . Smokeless tobacco: Current User    Types: Chew  . Alcohol Use: No     Comment: quit 4-82yrs ago  . Drug Use: No  . Sexual Activity: No   Other Topics Concern  . Not on file   Social History Narrative     ROS:  Positive    Negative    All sytems reviewed and are negative  Cardiovascular:  chest pain/pressure  palpitations  SOB lying flat  DOE  pain in legs while walking  pain in legs at rest  pain in legs at night  non-healing ulcers  hx of DVT  swelling in legs  Pulmonary:  productive cough  asthma/wheezing  home O2  Neurologic:  weakness in  arms  legs  numbness in  arms  legs  hx of CVA  mini stroke difficulty speaking or slurred speech  temporary loss of vision in one eye  dizziness  Hematologic:  hx of cancer  bleeding problems  problems with blood clotting easily  anemia  Endocrine:   diabetes   thyroid disease  GI  vomiting blood  blood in stool  hx gastric ulcer/GIB  hx of hepatitis B (in [facility])  GU:  CKD/renal failure  HD--[x]  M/W/F or  T/T/S  Hx kidney stones  burning with urination  blood in urine  Psychiatric:  anxiety  depression  Musculoskeletal:  arthritis  joint pain  Integumentary:  rashes  ulcers  Constitutional:  fever  chills   Physical Examination  Filed Vitals:   12/25/14 1930  BP: 114/69  Pulse: 80  Temp:   Resp: 16   Body mass index is 36.76 kg/(m^2).  General:  WDWN in NAD Gait: Not observed HENT: WNL,  normocephalic; very HOH with hearing aide  Pulmonary: normal non-labored breathing, without Rales, rhonchi,  wheezing Cardiac: regular, without  Murmurs, rubs or gallops Abdomen: soft, NT/ND, no masses Skin: without rashes, without ulcers  Vascular Exam/Pulses:  Right Left  Radial 2+ (normal) Unable to palpate   Ulnar Unable to palpate  Unable to palpate   DP 2+ (normal) 2+ (normal)   Extremities: without ischemic changes, without Gangrene , without cellulitis; without open wounds; +thrill/bruit within left forearm graft; small aneurysmal/blistering area of the proximal, radial side of the graft.  No drainage noted Musculoskeletal: no muscle wasting or atrophy  Neurologic: A&O X 3; Appropriate Affect ; SENSATION: normal; MOTOR FUNCTION:  moving all extremities equally. Speech is fluent/normal Psychiatric: Judgment intact, Mood & affect appropriate for pt's clinical situation Lymph: No Cervical, Axillary, or Inguinal lymphadenopathy    CBC    Component Value Date/Time   WBC 2.4* 09/11/2014 1410   RBC 3.59* 09/11/2014 1410   HGB 11.7* 09/11/2014 1410   HCT 35.0* 09/11/2014 1410   PLT 63* 09/11/2014 1410   MCV 97.5 09/11/2014 1410   MCH 32.6 09/11/2014 1410   MCHC 33.4 09/11/2014 1410   RDW 15.2 09/11/2014 1410   LYMPHSABS 0.9 09/11/2014 1410   MONOABS 0.3 09/11/2014 1410   EOSABS 0.1 09/11/2014 1410   BASOSABS 0.0 09/11/2014 1410    BMET    Component Value Date/Time   NA 130* 09/11/2014 1410   K 4.0 09/11/2014 1410   CL 93* 09/11/2014 1410   CO2 22 09/11/2014 1410   GLUCOSE 85 09/11/2014 1410   BUN 38* 09/11/2014 1410   CREATININE 7.99* 09/11/2014 1410   CALCIUM 8.3* 09/11/2014 1410   GFRNONAA 5* 09/11/2014 1410   GFRAA 5* 09/11/2014 1410    COAGS: Lab Results  Component Value Date   INR 1.34 12/26/2011   INR 1.17 09/22/2011   INR 1.40 06/06/2011     Non-Invasive Vascular Imaging:   none  Statin:  No. Beta Blocker:  Yes.   Aspirin:  No. ACEI:   No. ARB:  No. Other antiplatelets/anticoagulants:  No.    ASSESSMENT/PLAN: This is a 69 y.o. female with ESRD who dialyzes on M/W/F who presents with positive blood cultures from kidney center from 12/23/14 with possible infected left forearm graft   -pt does have a +thrill/bruit within the fistula -there is no obvious infection around the graft -there is an aneurysmal/blistering area on the radial side of the graft that may require a revision.  Do not stick the aneurysmal area of the graft. -given she has +blood cultures on 12/23/14 from her dialysis center, recommend admission to medicine service for IV ABx and medical management -she did not receive HD today and the renal service will need to be contacted. -Dr. Imogene Burn will be by to see the pt either tonight or in the morning.   Doreatha Massed, PA-C [facility] [phone]  Addendum  I have independently interviewed and examined the patient, and I agree with the physician assistant's findings.  Pt's exam is underwhelming for frank graft infection.  Pt has chronic neutropenia so the WBC is non-diagnostic.  Would get access duplex to check for fluid surrounding graft.  Would try to salvage the AVG if possible with antibiotics.  Leonides Sake, MD [facility] [place] Office: [phone] Pager: [phone]  12/26/2014, 8:37 AM

## 2014-12-25 NOTE — ED Notes (Signed)
Vascular at bedside

## 2014-12-25 NOTE — ED Notes (Signed)
Phlebotomy and Will PA at bedside.

## 2014-12-25 NOTE — ED Notes (Signed)
Resident at bedside attempting [place] guided venous blood stick.

## 2014-12-25 NOTE — Telephone Encounter (Signed)
Phone call from [facility].  Reported that pt. has blisters on left arm AVG and positive blood cultures.  Reported that Dr. Arrie Aran is requesting evaluation ASAP.  Discussed with Dr. Imogene Burn.  Recommended pt. to go to the ER for evaluation. Notified Adrianne, RN @ [facility] of recommendation.  Notified Triage Nurse @ [facility] that Dr. Imogene Burn recommended pt. to be evaluated and admitted by the Medical service today.

## 2014-12-25 NOTE — ED Notes (Signed)
[place] at bedside.

## 2014-12-25 NOTE — ED Notes (Addendum)
Pt from dialysis for eval of infected left shunt. Pt did not receive dialysis today. Pt had cultures drawn and dialysis RN states were "positive". Pt axox 4. nad noted. reddness and blistering noted to left arm, pt states its tender. Pt also had of hearing. Hearing aids noted.

## 2014-12-25 NOTE — H&P (Signed)
PCP:  Dorrene German, MD    Referring provider Dansie   Chief Complaint:  Sent from Renal doctor  HPI: Samantha [name] is a 69 y.o. female   has a past medical history of Shoulder pain; Headache(784.0); Dizziness; Occasional numbness/prickling/tingling of fingers and toes; Arthritis; Joint pain; Joint swelling; Gastric ulcer; Dry skin; Peripheral edema; Constipation; Oligouria; H/O: GI bleed; Hepatitis; History of kidney stones; History of blood transfusion; Hypertension; Anemia; Hypothyroidism; Impaired hearing; Diabetes mellitus; and Renal disorder.   Presented with blisters on left arm with history of AVG placed 4 years ago. She reports blisters were there almost a week. Reports pain in her arm. Some warmth no fever, no chills. Was sent here by nephrology who is aware of the patient. In emergency department case was discussed with Dr. Imogene Burn. Patient has end-stage renal disease on hemodialysis Monday Wednesday Friday. She was not able to undergo dialysis today secondary to suspected infected graft.  Reportedly blood cultures were positive from before unable to see in EPIC. Blood cultures were repeated in ER.   Case was discussed with vascular surgery by ER provider with  plan to admit to medicine on IV antibiotics surgery to consult. Case also discussed with nephrology who will see in consult.  Per their note there is a  possibility of aneurysmal blistering area on the radial side which may need revision. 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 She used to be a heavy drinker but quit 4 years ago. Family denies easy bruising or bleeding. Family reports increase low extremity swelling and abdominal girth. She still makes small amount of urine In emergency department patient was started on Zosyn and vancomycin. Of note patient has history of gastric ulcer in the past her GI bleeding she is on Protonix currently.  Hospitalist  was called for admission for likely infected left arm graft  Review of Systems:    Pertinent positives include: arm pain, Bilateral lower extremity swelling   Constitutional:  No weight loss, night sweats, Fevers, chills, fatigue, weight loss  HEENT:  No headaches, Difficulty swallowing,Tooth/dental problems,Sore throat,  No sneezing, itching, ear ache, nasal congestion, post nasal drip,  Cardio-vascular:  No chest pain, Orthopnea, PND, anasarca, dizziness, palpitations.no  GI:  No heartburn, indigestion, abdominal pain, nausea, vomiting, diarrhea, change in bowel habits, loss of appetite, melena, blood in stool, hematemesis Resp:  no shortness of breath at rest. No dyspnea on exertion, No excess mucus, no productive cough, No non-productive cough, No coughing up of blood.No change in color of mucus.No wheezing. Skin:  no rash or lesions. No jaundice GU:  no dysuria, change in color of urine, no urgency or frequency. No straining to urinate.  No flank pain.  Musculoskeletal:  No joint pain or no joint swelling. No decreased range of motion. No back pain.  Psych:  No change in mood or affect. No depression or anxiety. No memory loss.  Neuro: no localizing neurological complaints, no tingling, no weakness, no double vision, no gait abnormality, no slurred speech, no confusion  Otherwise ROS are negative except for above, 10 systems were reviewed  Past Medical History: Past Medical History  Diagnosis Date  . Shoulder pain   . Headache(784.0)     occasionally  . Dizziness   . Occasional numbness/prickling/tingling of fingers and toes   . Arthritis     left shoulder  . Joint pain   . Joint swelling   . Gastric ulcer   .  Dry skin   . Peripheral edema   . Constipation   . Oligouria   . H/O: GI bleed   . Hepatitis     Hep B  . History of kidney stones   . History of blood transfusion   . Hypertension   . Anemia   . Hypothyroidism     takes Synthroid daily  . Impaired  hearing   . Diabetes mellitus     borderline  . Renal disorder     m, w, F    Past Surgical History  Procedure Laterality Date  . Shoulder surgery  3-52yrs ago    right replacement  . Esophagogastroduodenoscopy  06/09/2011    Procedure: ESOPHAGOGASTRODUODENOSCOPY (EGD);  Surgeon: Theda Belfast, MD;  Location: [facility] ENDOSCOPY;  Service: Endoscopy;  Laterality: N/A;  . Cataract surgery      bilateral  . Reverse shoulder arthroplasty  09/22/2011    Procedure: REVERSE SHOULDER ARTHROPLASTY;  Surgeon: Verlee Rossetti, MD;  Location: [facility] OR;  Service: Orthopedics;  Laterality: Left;  left reverse shoulder arthroplasty  . Cholecystectomy  12/26/2011  . Cholecystectomy  12/26/2011    Procedure: LAPAROSCOPIC CHOLECYSTECTOMY;  Surgeon: Atilano Ina, MD,FACS;  Location: [facility] OR;  Service: General;  Laterality: N/A;  . Arteriovenous graft placement Left     forearm  . Shuntogram Left 08/29/2012    Procedure: SHUNTOGRAM;  Surgeon: Fransisco Hertz, MD;  Location: [facility] CATH LAB;  Service: Cardiovascular;  Laterality: Left;  arm     Medications: Prior to Admission medications   Medication Sig Start Date End Date Taking? Authorizing Provider  acetaminophen (TYLENOL) 500 MG tablet Take 500 mg by mouth every 6 (six) hours as needed for moderate pain.    Historical Provider, MD  HYDROcodone-acetaminophen (NORCO/VICODIN) 5-325 MG per tablet Take 2 tablets by mouth every 4 (four) hours as needed. 09/11/14   Nelva Nay, MD  metoprolol tartrate (LOPRESSOR) 25 MG tablet Take 25 mg by mouth 2 (two) times daily.    Historical Provider, MD  naproxen sodium (ANAPROX) 220 MG tablet Take 220 mg by mouth daily as needed (for pain).    Historical Provider, MD  omeprazole (PRILOSEC) 20 MG capsule Take 20 mg by mouth daily.    Historical Provider, MD  pantoprazole (PROTONIX) 40 MG tablet Take 40 mg by mouth daily. 05/02/14   Historical Provider, MD    Allergies:   Allergies  Allergen Reactions  . Banana Other (See Comments)      Due to dialysis  . Chocolate Other (See Comments)    Due to dialysis  . Fish-Derived Products Other (See Comments)    Due to gout    Social History:  Ambulatory independently walker cane, wheelchair bound, bed bound Lives at home   With family     reports that she has never smoked. Her smokeless tobacco use includes Chew. She reports that she does not drink alcohol or use illicit drugs.    Family History: family history is not on file.    Physical Exam: Patient Vitals for the past 24 hrs:  BP Temp Temp src Pulse Resp SpO2 Height Weight  12/25/14 2130 97/73 mmHg - - 74 16 100 % - -  12/25/14 2100 109/65 mmHg - - 77 16 100 % - -  12/25/14 2030 108/76 mmHg - - 87 16 98 % - -  12/25/14 1930 114/69 mmHg - - 80 16 97 % - -  12/25/14 1822 - - - - - - 4' 9.87" (  1.47 m) -  12/25/14 1709 113/74 mmHg - - 75 16 100 % - -  12/25/14 1319 - - - - - 98 % - -  12/25/14 1317 105/69 mmHg 98 F (36.7 C) Oral 72 16 97 % - 79.425 kg (175 lb 1.6 oz)    1. General:  in No Acute distress, hard of hearing 2. Psychological: Alert and  Oriented 3. Head/ENT:   Moist  Mucous Membranes                          Head Non traumatic, neck supple                          Normal   Dentition                          Muddy sclera 4. SKIN: normal  Skin turgor,  Skin clean Dry with area of blistering and redness on left upper extremity 5. Heart: Regular rate and rhythm no Murmur, Rub or gallop 6. Lungs: Clear to auscultation bilaterally, no wheezes or crackles   7. Abdomen: Soft, mild RUQ tenderness  distended 8. Lower extremities: no clubbing, cyanosis, or edema 9. Neurologically Grossly intact, moving all 4 extremities equally 10. MSK: Normal range of motion  body mass index is 36.76 kg/(m^2).   Labs on Admission:   Results for orders placed or performed during the hospital encounter of 12/25/14 (from the past 24 hour(s))  I-Stat CG4 Lactic Acid, ED     Status: Abnormal   Collection Time:  12/25/14  6:22 PM  Result Value Ref Range   Lactic Acid, Venous 2.02 (HH) 0.5 - 2.0 mmol/L   Comment NOTIFIED PHYSICIAN   Basic metabolic panel     Status: Abnormal   Collection Time: 12/25/14  8:05 PM  Result Value Ref Range   Sodium 132 (L) 135 - 145 mmol/L   Potassium 5.1 3.5 - 5.1 mmol/L   Chloride 97 (L) 101 - 111 mmol/L   CO2 22 22 - 32 mmol/L   Glucose, Bld 73 65 - 99 mg/dL   BUN 47 (H) 6 - 20 mg/dL   Creatinine, Ser 1.61 (H) 0.44 - 1.00 mg/dL   Calcium 7.2 (L) 8.9 - 10.3 mg/dL   GFR calc non Af Amer 4 (L) >60 mL/min   GFR calc Af Amer 5 (L) >60 mL/min   Anion gap 13 5 - 15  CBC with Differential/Platelet     Status: Abnormal   Collection Time: 12/25/14  8:05 PM  Result Value Ref Range   WBC 2.5 (L) 4.0 - 10.5 K/uL   RBC 3.15 (L) 3.87 - 5.11 MIL/uL   Hemoglobin 10.8 (L) 12.0 - 15.0 g/dL   HCT 09.6 (L) 04.5 - 40.9 %   MCV 97.1 78.0 - 100.0 fL   MCH 34.3 (H) 26.0 - 34.0 pg   MCHC 35.3 30.0 - 36.0 g/dL   RDW 81.1 91.4 - 78.2 %   Platelets 71 (L) 150 - 400 K/uL   Neutrophils Relative % 39 (L) 43 - 77 %   Neutro Abs 1.0 (L) 1.7 - 7.7 K/uL   Lymphocytes Relative 38 12 - 46 %   Lymphs Abs 0.9 0.7 - 4.0 K/uL   Monocytes Relative 11 3 - 12 %   Monocytes Absolute 0.3 0.1 - 1.0 K/uL   Eosinophils Relative 11 (H) 0 - 5 %  Eosinophils Absolute 0.3 0.0 - 0.7 K/uL   Basophils Relative 1 0 - 1 %   Basophils Absolute 0.0 0.0 - 0.1 K/uL       No results found for: HGBA1C  Estimated Creatinine Clearance: 5.3 mL/min (by C-G formula based on Cr of 8.72).  BNP (last 3 results) No results for input(s): PROBNP in the last 8760 hours.  Other results:  I have pearsonaly reviewed this: ECG REPORT  Not obtained   Filed Weights   12/25/14 1317  Weight: 79.425 kg (175 lb 1.6 oz)     Cultures:    Component Value Date/Time   SDES URINE, CATHETERIZED 08/03/2014 1256   SPECREQUEST NONE 08/03/2014 1256   CULT NO GROWTH Performed at [facility]  08/03/2014 1256    REPTSTATUS 08/05/2014 FINAL 08/03/2014 1256     Radiological Exams on Admission: Dg Chest 2 View  12/25/2014   CLINICAL DATA:  Patient from dialysis with infected shunt. Renal failure. Cough.  EXAM: CHEST  2 VIEW  COMPARISON:  None.  FINDINGS: Enlarged cardiac silhouette. BILATERAL diffuse pulmonary opacities suggestive of edema. No effusion or pneumothorax. Osteopenia. BILATERAL shoulder replacements.  IMPRESSION: Cardiomegaly with developing pulmonary edema.   Electronically Signed   By: Elsie Stain M.D.   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 Bacteremia will admit for broad-spectrum IV antibiotics. Cultures were repeated. We'll obtain echogram  . Thrombocytopenia most likely secondary to cirrhosis. This has been steadily getting worse per vascular surgery not an indication for transfusion this point  . End stage renal disease nephrology is aware. Patient will need dialysis tomorrow since she did not have dialysis today  . HTN (hypertension) hold metoprolol given hypotension  . Anemia in chronic kidney disease (CKD) stable will need to be full.by nephrology  . Hypotension patient in if history of cirrhosis likely expect some degree of hypertension. Continue to take in consideration patient undergoes hemodialysis  . 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 INR, ammonia level. Patient will need to have follow-up with GI  . Cardiomegaly will obtain echogram     Prophylaxis: SCD    CODE STATUS:  FULL CODE as per patient     Disposition:   To home once workup is complete and patient is stable  Other plan as per orders.  I have spent a total of 65 min on this admission given complexity of the case extra time was taken to review prior history  Suzie Vandam 12/25/2014, 10:25 PM  [facility]  Pager [phone]   after 2 AM please page floor coverage PA If 7AM-7PM, please contact the day team taking care of the patient  [email or web address]  Password [email or web address]

## 2014-12-25 NOTE — Consult Note (Signed)
ANTIBIOTIC CONSULT NOTE - INITIAL  Pharmacy Consult :  Vancomycin Indication : AVF infection  Allergies  Allergen Reactions  . Banana Other (See Comments)    Due to dialysis  . Chocolate Other (See Comments)    Due to dialysis  . Fish-Derived Products Other (See Comments)    Due to gout    Dosing Weight : 79.4 kg  VITALS: Temp: 98 F (36.7 C) (09/09 1317) Temp Source: Oral (09/09 1317) BP: 113/74 mmHg (09/09 1709) Pulse Rate: 75 (09/09 1709)  LABS:  Recent Labs Lab 12/25/14 2005  NA 132*  K 5.1  CL 97*  CO2 22  BUN 47*  CREATININE 8.72*  GLUCOSE 73    Recent Labs Lab 12/25/14 2005  HGB 10.8*  HCT 30.6*  WBC 2.5*  PLT 71*    Recent Labs Lab 12/25/14 1822 12/25/14 2005  WBC  --  2.5*  LATICACIDVEN 2.02*  --      MICRO: No results found for this or any previous visit (from the past 720 hour(s)).  ASSESSMENT:  69 y.o. with history of ESRD on HD-MWF, Hep B admitted from outpt HD center for infected AVF.  She did not receive HD today.  Zosyn + Vancomycin to be started.   No HD session to be scheduled today.  Will give Vancomycin load now.  GOAL:   Pre-Dialysis level 15 - 25 mcg/ml   PLAN:  1. Vancomycin 1750 mg IV x 1  2. Vancomycin 750 mg IV q HD prn until on regular schedule. 3. Will check Pre-HD vancomycin level when clinically indicated.  Velda Shell,  Pharm.D   12/25/2014,  7:33 PM

## 2014-12-25 NOTE — ED Notes (Signed)
1 set of blood cultures obtained by PA from right upper arm.

## 2014-12-25 NOTE — ED Provider Notes (Signed)
CSN: [id number]     Arrival date & time 12/25/14  1317 History   First MD Initiated Contact with Patient 12/25/14 1703     Chief Complaint  Patient presents with  . Vascular Access Problem   Samantha [name] is a 69 y.o. female with a history of end-stage renal disease on hemodialysis- MWF, hard of hearing, hepatitis B, and peripheral edema who presents to the emergency department from her dialysis clinic requesting evaluation and admission for possible infected left AVF. Nursing reported that she had positive blood cultures, but unsure what. The patient complains of pain and blistering to her left forearm, she is unsure how long this has been going on for. She did not receive dialysis today. Last dialysis was two days ago, Wednesday.  She is a patient of Dr. Arrie Aran at [facility] and Dr[facility]- They both requested she be evaluated and admitted to medicine today according to nursing staff. She denies fevers, recent illness, or other rashes on her body. She reports she's been eating and drinking normally.  (Consider location/radiation/quality/duration/timing/severity/associated sxs/prior Treatment) HPI  Past Medical History  Diagnosis Date  . Shoulder pain   . Headache(784.0)     occasionally  . Dizziness   . Occasional numbness/prickling/tingling of fingers and toes   . Arthritis     left shoulder  . Joint pain   . Joint swelling   . Gastric ulcer   . Dry skin   . Peripheral edema   . Constipation   . Oligouria   . H/O: GI bleed   . Hepatitis     Hep B  . History of kidney stones   . History of blood transfusion   . Hypertension   . Anemia   . Hypothyroidism     takes Synthroid daily  . Impaired hearing   . Diabetes mellitus     borderline  . Renal disorder     m, w, F    Past Surgical History  Procedure Laterality Date  . Shoulder surgery  3-50yrs ago    right replacement  . Esophagogastroduodenoscopy  06/09/2011    Procedure:  ESOPHAGOGASTRODUODENOSCOPY (EGD);  Surgeon: Theda Belfast, MD;  Location: [facility] ENDOSCOPY;  Service: Endoscopy;  Laterality: N/A;  . Cataract surgery      bilateral  . Reverse shoulder arthroplasty  09/22/2011    Procedure: REVERSE SHOULDER ARTHROPLASTY;  Surgeon: Verlee Rossetti, MD;  Location: [facility] OR;  Service: Orthopedics;  Laterality: Left;  left reverse shoulder arthroplasty  . Cholecystectomy  12/26/2011  . Cholecystectomy  12/26/2011    Procedure: LAPAROSCOPIC CHOLECYSTECTOMY;  Surgeon: Atilano Ina, MD,FACS;  Location: [facility] OR;  Service: General;  Laterality: N/A;  . Arteriovenous graft placement Left     forearm  . Shuntogram Left 08/29/2012    Procedure: SHUNTOGRAM;  Surgeon: Fransisco Hertz, MD;  Location: [facility] CATH LAB;  Service: Cardiovascular;  Laterality: Left;  arm   Family History  Problem Relation Age of Onset  . Hypertension Mother   . Diabetes Mother   . Cancer Brother    Social History  Substance Use Topics  . Smoking status: Never Smoker   . Smokeless tobacco: Current User    Types: Chew  . Alcohol Use: No     Comment: quit 4-75yrs ago   OB History    No data available     Review of Systems  Constitutional: Negative for fever and chills.  Respiratory: Negative for cough.  Gastrointestinal: Negative for vomiting.  Musculoskeletal: Positive for arthralgias.  Skin: Positive for color change and wound.      Allergies  Peanut butter flavor; Tomato; Banana; Chocolate; and Fish-derived products  Home Medications   Prior to Admission medications   Medication Sig Start Date End Date Taking? Authorizing Provider  cinacalcet (SENSIPAR) 30 MG tablet Take 30 mg by mouth daily with supper.   Yes Historical Provider, MD  acetaminophen (TYLENOL) 500 MG tablet Take 500 mg by mouth every 6 (six) hours as needed for moderate pain.    Historical Provider, MD  HYDROcodone-acetaminophen (NORCO/VICODIN) 5-325 MG per tablet Take 2 tablets by mouth every 4 (four) hours as needed.  09/11/14   Nelva Nay, MD  metoprolol tartrate (LOPRESSOR) 25 MG tablet Take 25 mg by mouth 2 (two) times daily.    Historical Provider, MD  naproxen sodium (ANAPROX) 220 MG tablet Take 220 mg by mouth daily as needed (for pain).    Historical Provider, MD  pantoprazole (PROTONIX) 40 MG tablet Take 40 mg by mouth daily. 05/02/14   Historical Provider, MD   BP 98/42 mmHg  Pulse 77  Temp(Src) 97.8 F (36.6 C) (Oral)  Resp 20  Ht 4' 9.87" (1.47 m)  Wt 183 lb 10.3 oz (83.3 kg)  BMI 38.55 kg/m2  SpO2 100% Physical Exam  Constitutional: She is oriented to person, place, and time. She appears well-developed and well-nourished. No distress.  Nontoxic appearing.  HENT:  Head: Normocephalic and atraumatic.  Eyes: Conjunctivae are normal. Pupils are equal, round, and reactive to light. Right eye exhibits no discharge. Left eye exhibits no discharge.  Neck: Neck supple.  Cardiovascular: Normal rate, regular rhythm, normal heart sounds and intact distal pulses.   Pulmonary/Chest: Effort normal and breath sounds normal. No respiratory distress. She has no wheezes. She has no rales.  Abdominal: Soft. She exhibits no distension. There is no tenderness. There is no guarding.  Musculoskeletal:  Tenderness to palpation of her left AVF. No erythema or warmth noted. Small blisters present over AVF. No left forearm edema. Bilateral leg edema present. Good and normal range of motion of her left arm.   Lymphadenopathy:    She has no cervical adenopathy.  Neurological: She is alert and oriented to person, place, and time. Coordination normal.  Skin: Skin is warm and dry. She is not diaphoretic. No erythema.  Psychiatric: She has a normal mood and affect. Her behavior is normal.  Very hard of hearing.   Nursing note and vitals reviewed.   ED Course  Procedures (including critical care time) Labs Review Labs Reviewed  BASIC METABOLIC PANEL - Abnormal; Notable for the following:    Sodium 132 (*)     Chloride 97 (*)    BUN 47 (*)    Creatinine, Ser 8.72 (*)    Calcium 7.2 (*)    GFR calc non Af Amer 4 (*)    GFR calc Af Amer 5 (*)    All other components within normal limits  CBC WITH DIFFERENTIAL/PLATELET - Abnormal; Notable for the following:    WBC 2.5 (*)    RBC 3.15 (*)    Hemoglobin 10.8 (*)    HCT 30.6 (*)    MCH 34.3 (*)    Platelets 71 (*)    Neutrophils Relative % 39 (*)    Neutro Abs 1.0 (*)    Eosinophils Relative 11 (*)    All other components within normal limits  HEPATIC FUNCTION PANEL - Abnormal; Notable for  the following:    Albumin 3.0 (*)    AST 72 (*)    ALT 11 (*)    Alkaline Phosphatase 156 (*)    All other components within normal limits  PROTIME-INR - Abnormal; Notable for the following:    Prothrombin Time 17.6 (*)    All other components within normal limits  AMMONIA - Abnormal; Notable for the following:    Ammonia 47 (*)    All other components within normal limits  GLUCOSE, CAPILLARY - Abnormal; Notable for the following:    Glucose-Capillary 53 (*)    All other components within normal limits  I-STAT CG4 LACTIC ACID, ED - Abnormal; Notable for the following:    Lactic Acid, Venous 2.02 (*)    All other components within normal limits  CULTURE, BLOOD (ROUTINE X 2)  CULTURE, BLOOD (ROUTINE X 2)  CBC WITH DIFFERENTIAL/PLATELET  MAGNESIUM  PHOSPHORUS  TSH  COMPREHENSIVE METABOLIC PANEL  CBC  PROTIME-INR  AMMONIA  PROTIME-INR  HEPATITIS PANEL, ACUTE  PREALBUMIN    Imaging Review Dg Chest 2 View  12/25/2014   CLINICAL DATA:  Patient from dialysis with infected shunt. Renal failure. Cough.  EXAM: CHEST  2 VIEW  COMPARISON:  None.  FINDINGS: Enlarged cardiac silhouette. BILATERAL diffuse pulmonary opacities suggestive of edema. No effusion or pneumothorax. Osteopenia. BILATERAL shoulder replacements.  IMPRESSION: Cardiomegaly with developing pulmonary edema.   Electronically Signed   By: Elsie Stain M.D.   On: 12/25/2014 19:12   I have  personally reviewed and evaluated these images and lab results as part of my medical decision-making.   EKG Interpretation None      Filed Vitals:   12/25/14 2300 12/25/14 2330 12/26/14 0035 12/26/14 0051  BP: 109/61 110/78  98/42  Pulse:  81  77  Temp:    97.8 F (36.6 C)  TempSrc:      Resp:  18  20  Height:      Weight:   183 lb 10.3 oz (83.3 kg)   SpO2:  100%  100%     MDM   Meds given in ED:  Medications  piperacillin-tazobactam (ZOSYN) IVPB 2.25 g (not administered)  vancomycin (VANCOCIN) 750 mg in sodium chloride 0.9 % 150 mL IVPB (not administered)  cinacalcet (SENSIPAR) tablet 30 mg (not administered)  pantoprazole (PROTONIX) EC tablet 20 mg (not administered)  sodium chloride 0.9 % injection 3 mL (not administered)  ondansetron (ZOFRAN) tablet 4 mg (not administered)    Or  ondansetron (ZOFRAN) injection 4 mg (not administered)  insulin aspart (novoLOG) injection 0-5 Units (not administered)  sodium chloride 0.9 % injection 3 mL (not administered)  sodium chloride 0.9 % injection 3 mL (not administered)  0.9 %  sodium chloride infusion (not administered)  insulin aspart (novoLOG) injection 0-9 Units (not administered)  dextrose 50 % solution (not administered)  piperacillin-tazobactam (ZOSYN) IVPB 3.375 g (0 g Intravenous Stopped 12/25/14 2114)  vancomycin (VANCOCIN) 1,750 mg in sodium chloride 0.9 % 500 mL IVPB (1,750 mg Intravenous New Bag/Given 12/25/14 2206)  dextrose 50 % solution 25 mL (25 mLs Intravenous Given 12/26/14 0059)    Current Discharge Medication List      Final diagnoses:  Infection  Bacteremia   This  is a 69 y.o. female with a history of end-stage renal disease on hemodialysis- MWF, hard of hearing, hepatitis B, and peripheral edema who presents to the emergency department from her dialysis clinic requesting evaluation and admission for possible infected left AVF. I consulted  with nephrologist Dr. Kathrene Bongo who was unaware the patient  reports she will seek further information and call me back. I consulted with vascular surgeon Dr. Imogene Burn who reports that he was aware of the patient and knew the patient had positive blood cultures and a possible infected AVF. He reports he like the patient started on Zosyn and vancomycin and would see her tomorrow and consult for likely surgery. Dr. Kathrene Bongo, back and reported that the patient had positive blood cultures on 12/23/2014 with gram negative bacilli.  She reports she will see the patient in consult tomorrow unless there is acute abnormalities on her blood work. Patient CBC shows a white count of 2.5 with a hemoglobin of 10.8. Her platelet count is 71. The patient's pilots are chronically low. Her BMP indicates a potassium of 5.1 and a sodium of 132. No need for emergent dialysis tonight.  Patient started on vancomycin and Zosyn in the ED.  Patient is to be admitted by Dr. Beatrice Lecher. I consulted with Dr. Imogene Burn about her platelet count of 71 and he does not feel she needs a transfusion at this time. Plan for medical admission. Dr. Beatrice Lecher by to place admission orders.   This patient was discussed with and evaluated by Dr. Dalene Seltzer who agrees with assessment and plan.     Everlene Farrier, PA-C 12/26/14 0134  Alvira Monday, MD 12/29/14 2107

## 2014-12-26 ENCOUNTER — Inpatient Hospital Stay (HOSPITAL_COMMUNITY): Payer: Medicare Other

## 2014-12-26 DIAGNOSIS — N186 End stage renal disease: Secondary | ICD-10-CM

## 2014-12-26 DIAGNOSIS — N189 Chronic kidney disease, unspecified: Secondary | ICD-10-CM

## 2014-12-26 DIAGNOSIS — K7460 Unspecified cirrhosis of liver: Secondary | ICD-10-CM | POA: Diagnosis present

## 2014-12-26 DIAGNOSIS — K7469 Other cirrhosis of liver: Secondary | ICD-10-CM

## 2014-12-26 DIAGNOSIS — D631 Anemia in chronic kidney disease: Secondary | ICD-10-CM

## 2014-12-26 DIAGNOSIS — T827XXA Infection and inflammatory reaction due to other cardiac and vascular devices, implants and grafts, initial encounter: Secondary | ICD-10-CM

## 2014-12-26 DIAGNOSIS — R7881 Bacteremia: Secondary | ICD-10-CM

## 2014-12-26 LAB — HEPATIC FUNCTION PANEL
ALBUMIN: 3 g/dL — AB (ref 3.5–5.0)
ALK PHOS: 156 U/L — AB (ref 38–126)
ALT: 11 U/L — ABNORMAL LOW (ref 14–54)
AST: 72 U/L — AB (ref 15–41)
Bilirubin, Direct: 0.5 mg/dL (ref 0.1–0.5)
Indirect Bilirubin: 0.5 mg/dL (ref 0.3–0.9)
Total Bilirubin: 1 mg/dL (ref 0.3–1.2)
Total Protein: 7.4 g/dL (ref 6.5–8.1)

## 2014-12-26 LAB — TSH: TSH: 10.891 u[IU]/mL — AB (ref 0.350–4.500)

## 2014-12-26 LAB — CBC
HEMATOCRIT: 28.1 % — AB (ref 36.0–46.0)
Hemoglobin: 9.6 g/dL — ABNORMAL LOW (ref 12.0–15.0)
MCH: 33.8 pg (ref 26.0–34.0)
MCHC: 34.2 g/dL (ref 30.0–36.0)
MCV: 98.9 fL (ref 78.0–100.0)
PLATELETS: 60 10*3/uL — AB (ref 150–400)
RBC: 2.84 MIL/uL — AB (ref 3.87–5.11)
RDW: 15.3 % (ref 11.5–15.5)
WBC: 2.2 10*3/uL — AB (ref 4.0–10.5)

## 2014-12-26 LAB — AMMONIA
Ammonia: 47 umol/L — ABNORMAL HIGH (ref 9–35)
Ammonia: 99 umol/L — ABNORMAL HIGH (ref 9–35)

## 2014-12-26 LAB — T4, FREE: FREE T4: 0.75 ng/dL (ref 0.61–1.12)

## 2014-12-26 LAB — COMPREHENSIVE METABOLIC PANEL
ALT: 6 U/L — ABNORMAL LOW (ref 14–54)
AST: 52 U/L — ABNORMAL HIGH (ref 15–41)
Albumin: 2.5 g/dL — ABNORMAL LOW (ref 3.5–5.0)
Alkaline Phosphatase: 114 U/L (ref 38–126)
Anion gap: 17 — ABNORMAL HIGH (ref 5–15)
BILIRUBIN TOTAL: 1.2 mg/dL (ref 0.3–1.2)
BUN: 51 mg/dL — ABNORMAL HIGH (ref 6–20)
CHLORIDE: 100 mmol/L — AB (ref 101–111)
CO2: 18 mmol/L — ABNORMAL LOW (ref 22–32)
Calcium: 6.6 mg/dL — ABNORMAL LOW (ref 8.9–10.3)
Creatinine, Ser: 9.52 mg/dL — ABNORMAL HIGH (ref 0.44–1.00)
GFR, EST AFRICAN AMERICAN: 4 mL/min — AB (ref >60)
GFR, EST NON AFRICAN AMERICAN: 4 mL/min — AB (ref >60)
Glucose, Bld: 71 mg/dL (ref 65–99)
POTASSIUM: 4.6 mmol/L (ref 3.5–5.1)
Sodium: 135 mmol/L (ref 135–145)
TOTAL PROTEIN: 6.7 g/dL (ref 6.5–8.1)

## 2014-12-26 LAB — PREALBUMIN: PREALBUMIN: 20 mg/dL (ref 18–38)

## 2014-12-26 LAB — PROTIME-INR
INR: 1.46 (ref 0.00–1.49)
INR: 1.53 — ABNORMAL HIGH (ref 0.00–1.49)
PROTHROMBIN TIME: 17.8 s — AB (ref 11.6–15.2)
Prothrombin Time: 18.4 seconds — ABNORMAL HIGH (ref 11.6–15.2)

## 2014-12-26 LAB — GLUCOSE, CAPILLARY
GLUCOSE-CAPILLARY: 92 mg/dL (ref 65–99)
Glucose-Capillary: 53 mg/dL — ABNORMAL LOW (ref 65–99)
Glucose-Capillary: 66 mg/dL (ref 65–99)
Glucose-Capillary: 75 mg/dL (ref 65–99)

## 2014-12-26 LAB — PHOSPHORUS: PHOSPHORUS: 6.6 mg/dL — AB (ref 2.5–4.6)

## 2014-12-26 LAB — MAGNESIUM: MAGNESIUM: 2 mg/dL (ref 1.7–2.4)

## 2014-12-26 MED ORDER — PANTOPRAZOLE SODIUM 20 MG PO TBEC
20.0000 mg | DELAYED_RELEASE_TABLET | Freq: Every day | ORAL | Status: DC
  Administered 2014-12-26 – 2014-12-28 (×3): 20 mg via ORAL
  Filled 2014-12-26 (×4): qty 1

## 2014-12-26 MED ORDER — MORPHINE SULFATE (PF) 2 MG/ML IV SOLN
1.0000 mg | INTRAVENOUS | Status: DC | PRN
  Administered 2014-12-26: 1 mg via INTRAVENOUS
  Filled 2014-12-26: qty 1

## 2014-12-26 MED ORDER — IOHEXOL 300 MG/ML  SOLN
100.0000 mL | Freq: Once | INTRAMUSCULAR | Status: AC | PRN
  Administered 2014-12-26: 100 mL via INTRAVENOUS

## 2014-12-26 MED ORDER — PENTAFLUOROPROP-TETRAFLUOROETH EX AERO: 1.0000 "application " | INHALATION_SPRAY | CUTANEOUS | Status: DC | PRN

## 2014-12-26 MED ORDER — ALTEPLASE 2 MG IJ SOLR
2.0000 mg | Freq: Once | INTRAMUSCULAR | Status: DC | PRN
  Filled 2014-12-26: qty 2

## 2014-12-26 MED ORDER — SODIUM CHLORIDE 0.9 % IJ SOLN
3.0000 mL | INTRAMUSCULAR | Status: DC | PRN
  Administered 2014-12-27: 3 mL via INTRAVENOUS
  Filled 2014-12-26: qty 3

## 2014-12-26 MED ORDER — LIDOCAINE-PRILOCAINE 2.5-2.5 % EX CREA
1.0000 "application " | TOPICAL_CREAM | CUTANEOUS | Status: DC | PRN
  Filled 2014-12-26: qty 5

## 2014-12-26 MED ORDER — ONDANSETRON HCL 4 MG/2ML IJ SOLN: 4.0000 mg | Freq: Four times a day (QID) | INTRAMUSCULAR | Status: DC | PRN

## 2014-12-26 MED ORDER — RENA-VITE PO TABS
1.0000 | ORAL_TABLET | Freq: Every day | ORAL | Status: DC
  Administered 2014-12-26 – 2014-12-27 (×2): 1 via ORAL
  Filled 2014-12-26 (×2): qty 1

## 2014-12-26 MED ORDER — CINACALCET HCL 30 MG PO TABS
30.0000 mg | ORAL_TABLET | Freq: Every day | ORAL | Status: DC
Start: 2014-12-26 — End: 2014-12-26

## 2014-12-26 MED ORDER — DARBEPOETIN ALFA 60 MCG/0.3ML IJ SOSY
60.0000 ug | PREFILLED_SYRINGE | Freq: Once | INTRAMUSCULAR | Status: AC
  Administered 2014-12-26: 60 ug via INTRAVENOUS

## 2014-12-26 MED ORDER — HEPARIN SODIUM (PORCINE) 1000 UNIT/ML DIALYSIS: 1000.0000 [IU] | INTRAMUSCULAR | Status: DC | PRN

## 2014-12-26 MED ORDER — LIDOCAINE HCL (PF) 1 % IJ SOLN: 5.0000 mL | INTRAMUSCULAR | Status: DC | PRN

## 2014-12-26 MED ORDER — ONDANSETRON HCL 4 MG PO TABS: 4.0000 mg | ORAL_TABLET | Freq: Four times a day (QID) | ORAL | Status: DC | PRN

## 2014-12-26 MED ORDER — VANCOMYCIN HCL IN DEXTROSE 750-5 MG/150ML-% IV SOLN: 750.0000 mg | INTRAVENOUS | Status: DC

## 2014-12-26 MED ORDER — INSULIN ASPART 100 UNIT/ML ~~LOC~~ SOLN: 0.0000 [IU] | Freq: Three times a day (TID) | SUBCUTANEOUS | Status: DC

## 2014-12-26 MED ORDER — DEXTROSE 50 % IV SOLN
25.0000 mL | Freq: Once | INTRAVENOUS | Status: AC
  Administered 2014-12-26: 25 mL via INTRAVENOUS

## 2014-12-26 MED ORDER — LEVOTHYROXINE SODIUM 50 MCG PO TABS
50.0000 ug | ORAL_TABLET | Freq: Every day | ORAL | Status: DC
  Administered 2014-12-26 – 2014-12-28 (×3): 50 ug via ORAL
  Filled 2014-12-26 (×3): qty 1

## 2014-12-26 MED ORDER — SODIUM CHLORIDE 0.9 % IJ SOLN
3.0000 mL | Freq: Two times a day (BID) | INTRAMUSCULAR | Status: DC
  Administered 2014-12-27: 3 mL via INTRAVENOUS

## 2014-12-26 MED ORDER — IOHEXOL 300 MG/ML  SOLN
25.0000 mL | INTRAMUSCULAR | Status: AC
  Administered 2014-12-26 (×2): 25 mL via ORAL

## 2014-12-26 MED ORDER — HEPARIN SODIUM (PORCINE) 1000 UNIT/ML DIALYSIS: 2000.0000 [IU] | Freq: Once | INTRAMUSCULAR | Status: DC

## 2014-12-26 MED ORDER — SODIUM CHLORIDE 0.9 % IV SOLN
100.0000 mL | INTRAVENOUS | Status: DC | PRN
Start: 2014-12-26 — End: 2014-12-26

## 2014-12-26 MED ORDER — SODIUM CHLORIDE 0.9 % IV SOLN: 100.0000 mL | INTRAVENOUS | Status: DC | PRN

## 2014-12-26 MED ORDER — SODIUM CHLORIDE 0.9 % IV SOLN: 250.0000 mL | INTRAVENOUS | Status: DC | PRN

## 2014-12-26 MED ORDER — SODIUM CHLORIDE 0.9 % IJ SOLN
3.0000 mL | Freq: Two times a day (BID) | INTRAMUSCULAR | Status: DC
  Administered 2014-12-26 – 2014-12-28 (×4): 3 mL via INTRAVENOUS

## 2014-12-26 MED ORDER — DEXTROSE 50 % IV SOLN
INTRAVENOUS | Status: AC
  Administered 2014-12-26: 50 mL
  Filled 2014-12-26: qty 50

## 2014-12-26 MED ORDER — DARBEPOETIN ALFA 60 MCG/0.3ML IJ SOSY
PREFILLED_SYRINGE | INTRAMUSCULAR | Status: AC
  Filled 2014-12-26: qty 0.3

## 2014-12-26 MED ORDER — IOHEXOL 300 MG/ML  SOLN: 25.0000 mL | INTRAMUSCULAR | Status: AC

## 2014-12-26 MED ORDER — INSULIN ASPART 100 UNIT/ML ~~LOC~~ SOLN: 0.0000 [IU] | Freq: Every day | SUBCUTANEOUS | Status: DC

## 2014-12-26 NOTE — Consult Note (Signed)
Indication for Consultation:  Management of ESRD/hemodialysis; anemia, hypertension/volume and secondary hyperparathyroidism  HPI: Samantha [name] is a 69 y.o. female who presented to the ED yesterday for evaluation of blister on AVG and + blood cultres. [place] [facility], did not receive treatment yesterday. Blood cultures drawn 9/7 were positive for Gram negative bacilli so she was admitted for IV antibiotics. [facility] has been consulted and do not feel the AVG is obviously infected but receommend duplex to eval for fluid around AVG. Will arrange for HD while admitted.  Past Medical History  Diagnosis Date  . Shoulder pain   . Headache(784.0)     occasionally  . Dizziness   . Occasional numbness/prickling/tingling of fingers and toes   . Arthritis     left shoulder  . Joint pain   . Joint swelling   . Gastric ulcer   . Dry skin   . Peripheral edema   . Constipation   . Oligouria   . H/O: GI bleed   . Hepatitis     Hep B  . History of kidney stones   . History of blood transfusion   . Hypertension   . Anemia   . Hypothyroidism     takes Synthroid daily  . Impaired hearing   . Diabetes mellitus     borderline  . Renal disorder     m, w, F    Past Surgical History  Procedure Laterality Date  . Shoulder surgery  3-11yrs ago    right replacement  . Esophagogastroduodenoscopy  06/09/2011    Procedure: ESOPHAGOGASTRODUODENOSCOPY (EGD);  Surgeon: Theda Belfast, MD;  Location: [facility] ENDOSCOPY;  Service: Endoscopy;  Laterality: N/A;  . Cataract surgery      bilateral  . Reverse shoulder arthroplasty  09/22/2011    Procedure: REVERSE SHOULDER ARTHROPLASTY;  Surgeon: Verlee Rossetti, MD;  Location: [facility] OR;  Service: Orthopedics;  Laterality: Left;  left reverse shoulder arthroplasty  . Cholecystectomy  12/26/2011  . Cholecystectomy  12/26/2011    Procedure: LAPAROSCOPIC CHOLECYSTECTOMY;  Surgeon: Atilano Ina, MD,FACS;  Location: [facility] OR;  Service: General;  Laterality: N/A;  .  Arteriovenous graft placement Left     forearm  . Shuntogram Left 08/29/2012    Procedure: SHUNTOGRAM;  Surgeon: Fransisco Hertz, MD;  Location: [facility] CATH LAB;  Service: Cardiovascular;  Laterality: Left;  arm   Family History  Problem Relation Age of Onset  . Hypertension Mother   . Diabetes Mother   . Cancer Brother    Social History:  reports that she has never smoked. Her smokeless tobacco use includes Chew. She reports that she does not drink alcohol or use illicit drugs. Allergies  Allergen Reactions  . Peanut Butter Flavor   . Tomato   . Banana Other (See Comments)    Due to dialysis  . Chocolate Other (See Comments)    Due to dialysis  . Fish-Derived Products Other (See Comments)    Due to gout   Prior to Admission medications   Medication Sig Start Date End Date Taking? Authorizing Provider  cinacalcet (SENSIPAR) 30 MG tablet Take 30 mg by mouth daily with supper.   Yes Historical Provider, MD  acetaminophen (TYLENOL) 500 MG tablet Take 500 mg by mouth every 6 (six) hours as needed for moderate pain.    Historical Provider, MD  HYDROcodone-acetaminophen (NORCO/VICODIN) 5-325 MG per tablet Take 2 tablets by mouth every 4 (four) hours as needed. 09/11/14   Nelva Nay, MD  metoprolol tartrate (LOPRESSOR) 25 MG tablet Take 25 mg by mouth 2 (two) times daily.    Historical Provider, MD  naproxen sodium (ANAPROX) 220 MG tablet Take 220 mg by mouth daily as needed (for pain).    Historical Provider, MD  pantoprazole (PROTONIX) 40 MG tablet Take 40 mg by mouth daily. 05/02/14   Historical Provider, MD   Current Facility-Administered Medications  Medication Dose Route Frequency Provider Last Rate Last Dose  . 0.9 %  sodium chloride infusion  250 mL Intravenous PRN Therisa Doyne, MD      . cinacalcet (SENSIPAR) tablet 30 mg  30 mg Oral Q supper Therisa Doyne, MD      . morphine 2 MG/ML injection 1 mg  1 mg Intravenous Q4H PRN Rolan Lipa, NP   1 mg at 12/26/14  0430  . ondansetron (ZOFRAN) tablet 4 mg  4 mg Oral Q6H PRN Therisa Doyne, MD       Or  . ondansetron (ZOFRAN) injection 4 mg  4 mg Intravenous Q6H PRN Therisa Doyne, MD      . pantoprazole (PROTONIX) EC tablet 20 mg  20 mg Oral Daily Therisa Doyne, MD      . piperacillin-tazobactam (ZOSYN) IVPB 2.25 g  2.25 g Intravenous Q8H Alvira Monday, MD   2.25 g at 12/26/14 0409  . sodium chloride 0.9 % injection 3 mL  3 mL Intravenous Q12H Therisa Doyne, MD   0 mL at 12/26/14 0158  . sodium chloride 0.9 % injection 3 mL  3 mL Intravenous Q12H Anastassia Doutova, MD      . sodium chloride 0.9 % injection 3 mL  3 mL Intravenous PRN Therisa Doyne, MD      . vancomycin (VANCOCIN) 750 mg in sodium chloride 0.9 % 150 mL IVPB  750 mg Intravenous Q dialysis Alvira Monday, MD       Labs: Basic Metabolic Panel:  Recent Labs Lab 12/25/14 2005 12/26/14 0513  NA 132* 135  K 5.1 4.6  CL 97* 100*  CO2 22 18*  GLUCOSE 73 71  BUN 47* 51*  CREATININE 8.72* 9.52*  CALCIUM 7.2* 6.6*  PHOS  --  6.6*   Liver Function Tests:  Recent Labs Lab 12/25/14 2318 12/26/14 0513  AST 72* 52*  ALT 11* 6*  ALKPHOS 156* 114  BILITOT 1.0 1.2  PROT 7.4 6.7  ALBUMIN 3.0* 2.5*   No results for input(s): LIPASE, AMYLASE in the last 168 hours.  Recent Labs Lab 12/25/14 2318 12/26/14 0121  AMMONIA 47* 99*   CBC:  Recent Labs Lab 12/25/14 2005 12/26/14 0513  WBC 2.5* 2.2*  NEUTROABS 1.0*  --   HGB 10.8* 9.6*  HCT 30.6* 28.1*  MCV 97.1 98.9  PLT 71* 60*   Cardiac Enzymes: No results for input(s): CKTOTAL, CKMB, CKMBINDEX, TROPONINI in the last 168 hours. CBG:  Recent Labs Lab 12/26/14 0044 12/26/14 0136  GLUCAP 53* 92   Iron Studies: No results for input(s): IRON, TIBC, TRANSFERRIN, FERRITIN in the last 72 hours. Studies/Results: Dg Chest 2 View  12/25/2014   CLINICAL DATA:  Patient from dialysis with infected shunt. Renal failure. Cough.  EXAM: CHEST  2 VIEW   COMPARISON:  None.  FINDINGS: Enlarged cardiac silhouette. BILATERAL diffuse pulmonary opacities suggestive of edema. No effusion or pneumothorax. Osteopenia. BILATERAL shoulder replacements.  IMPRESSION: Cardiomegaly with developing pulmonary edema.   Electronically Signed   By: Elsie Stain M.D.   On: 12/25/2014 19:12    Review of Systems:  Pt hard of hearing- difficult to obtain No complaints  Physical Exam: Filed Vitals:   12/26/14 0035 12/26/14 0051 12/26/14 0500 12/26/14 0820  BP:   Pulse:  77 80 84  Temp:  97.8 F (36.6 C) 98.3 F (36.8 C) 98.1 F (36.7 C)  TempSrc:    Oral  Resp:  Height:      Weight: 83.3 kg (183 lb 10.3 oz)     SpO2:  100% 98% 100%     General: Well developed, well nourished, in no acute distress. Hard of hearing Head: Normocephalic, atraumatic, sclera non-icteric, mucus membranes are moist Neck: Supple. JVD not elevated. Lungs: Clear bilaterally to auscultation without wheezes, rales, or rhonchi. Breathing is unlabored. Heart: RRR with S1 S2. No murmurs, rubs, or gallops appreciated. Abdomen: obese Soft, non-tender, non-distended with normoactive bowel sounds. No rebound/guarding. No obvious abdominal masses. M-S:  Strength and tone appear normal for age. Lower extremities:without edema or ischemic changes, no open wounds  Neuro: Alert and oriented X 3. Moves all extremities spontaneously. Psych:  Responds to questions appropriately with a normal affect. Dialysis Access: L AVG+b/t with blister  Dialysis Orders: [facility]WF [facility] 4 hrs  77kgs     3K/2.5ca      2000u heparin    L AVG Profile 4  venofer 50 q week hectorol 4  Assessment/Plan: 1. Bacteremia- + blood cultures from 9/7 at outpt HD center for Gram negative bacilli- Vanc and zosyn. Cultures repeated on admission 2. Blister AVG- seen by [facility]- recommend dluplex to check for fluid around AVG- continue antibiotics.  3.  ESRD -  MWF [facility]- HD today since missed yesterday and  get back on schedule monday 4.  Hypertension/volume  - Hypotension here- asymptomatic/ alert and oriented.BP variable at outpt center- pre HD SBP range 104-200. Post SBP 90-140s. Home meds norvasc and metop. Occasional high gains- not reaching edw. Echo pending 5.  Anemia  - hgb 9.6- start ESA. Cont Fe. outpt hghb 11.8 on 9.7- check stool- history of GIB 6.  Metabolic bone disease -  PTH 97 phos 6.6- hold sensipar- PTH down from 235. Cont hectorol. Corr Ca+ low 8s- 2.5 ca bath 7.  Nutrition - renal diet. vitamin  Jetty Duhamel, NP [employer] [phone] 12/26/2014, 8:48 AM   Pt seen, examined and agree w A/P as above.  Vinson Moselle MD pager [phone]    cell [phone] 12/26/2014, 2:07 PM

## 2014-12-26 NOTE — Progress Notes (Signed)
Phlebotomist attempted to draw scheduled labs and the son-in-law refused blood draw. His issue is the patient has been stuck multiple times and her labs were "just drawn 1hr ago."  Son in law agrees to let phlebotomy draw labs at 0500. MD notified.

## 2014-12-26 NOTE — Progress Notes (Signed)
[facility] PROGRESS NOTE  Samantha [name] WUJ:[id number] DOB: 24-Feb-1946 DOA: 12/25/2014 PCP: Dorrene German, MD  Assessment/Plan: 1. 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 ESRD with AVG issues -per VVS, ok to use -FU [place] -HD per renal  5. Anemia of chronic disease -stable  6. Hypertension -BP fluctuates, on lower side now, but not symptomatic -monitor, norvasc and metoprolol on hold  7. 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 deficits too -TSH profoundly low, T4 low normal, start Synthroid  DVT proph: SCDs due to low plts  Code Status: Full Code Family Communication: none at bedside Disposition Plan: when stable   Consultants:  Renal  VVS   HPI/Subjective: Feels ok, very hard of hearing  Objective: Filed Vitals:   12/26/14 0900  BP: 84/50  Pulse: 83  Temp:   Resp: 19    Intake/Output Summary (Last 24 hours) at 12/26/14 1113 Last data filed at 12/26/14 1021  Gross per 24 hour  Intake    240 ml  Output      0 ml  Net    240 ml   Filed Weights   12/25/14 1317 12/26/14 0035  Weight: 79.425 kg (175 lb 1.6 oz) 83.3 kg (183 lb 10.3 oz)    Exam:   General:  AAOx2, very hard of hearing, cognitive deficits  Cardiovascular: S1S2/RRR  Respiratory: CTAB  Abdomen: soft, NT, BS present  Musculoskeletal: no edema c/c  L arm AVG   Data Reviewed: Basic Metabolic Panel:  Recent Labs Lab 12/25/14 2005 12/26/14 0513  NA 132* 135  K 5.1 4.6  CL 97* 100*  CO2 22 18*  GLUCOSE 73  71  BUN 47* 51*  CREATININE 8.72* 9.52*  CALCIUM 7.2* 6.6*  MG  --  2.0  PHOS  --  6.6*   Liver Function Tests:  Recent Labs Lab 12/25/14 2318 12/26/14 0513  AST 72* 52*  ALT 11* 6*  ALKPHOS 156* 114  BILITOT 1.0 1.2  PROT 7.4 6.7  ALBUMIN 3.0* 2.5*   No results for input(s): LIPASE, AMYLASE in the last 168 hours.  Recent Labs Lab 12/25/14 2318 12/26/14 0121  AMMONIA 47* 99*   CBC:  Recent Labs Lab 12/25/14 2005 12/26/14 0513  WBC 2.5* 2.2*  NEUTROABS 1.0*  --   HGB 10.8* 9.6*  HCT 30.6* 28.1*  MCV 97.1 98.9  PLT 71* 60*   Cardiac Enzymes: No results for input(s): CKTOTAL, CKMB, CKMBINDEX, TROPONINI in the last 168 hours. BNP (last 3 results) No results for input(s): BNP in the last 8760 hours.  ProBNP (last 3 results) No results for input(s): PROBNP in the last 8760 hours.  CBG:  Recent Labs Lab 12/26/14 0044 12/26/14 0136  GLUCAP 53* 92    Recent Results (from the past 240 hour(s))  Culture, blood (routine x 2)     Status: None (Preliminary result)   Collection Time: 12/25/14  8:22 PM  Result Value Ref Range Status   Specimen Description BLOOD RIGHT ARM  Final   Special Requests BOTTLES DRAWN AEROBIC AND ANAEROBIC  Final  Culture NO GROWTH < 12 HOURS  Final   Report Status PENDING  Incomplete     Studies: Dg Chest 2 View  12/25/2014   CLINICAL DATA:  Patient from dialysis with infected shunt. Renal failure. Cough.  EXAM: CHEST  2 VIEW  COMPARISON:  None.  FINDINGS: Enlarged cardiac silhouette. BILATERAL diffuse pulmonary opacities suggestive of edema. No effusion or pneumothorax. Osteopenia. BILATERAL shoulder replacements.  IMPRESSION: Cardiomegaly with developing pulmonary edema.   Electronically Signed   By: Elsie Stain M.D.   On: 12/25/2014 19:12    Scheduled Meds: . darbepoetin (ARANESP) injection - DIALYSIS  60 mcg Intravenous Once  . multivitamin  1 tablet Oral QHS  . pantoprazole  20 mg Oral Daily  . piperacillin-tazobactam   2.25 g Intravenous Q8H  . sodium chloride  3 mL Intravenous Q12H  . sodium chloride  3 mL Intravenous Q12H   Continuous Infusions:  Antibiotics Given (last 72 hours)    Date/Time Action Medication Dose Rate   12/26/14 0409 Given   piperacillin-tazobactam (ZOSYN) IVPB 2.25 g 2.25 g 100 mL/hr      Active Problems:   HTN (hypertension)   Anemia in chronic kidney disease (CKD)   Hypotension   End stage renal disease   Thrombocytopenia   Cardiomegaly   Bacteremia   Cirrhosis of liver    Time spent:    [facility]  [facility] Pager [phone]. If 7PM-7AM, please contact night-coverage at [email or web address], password [facility] 12/26/2014, 11:13 AM  LOS: 1 day

## 2014-12-27 ENCOUNTER — Inpatient Hospital Stay (HOSPITAL_COMMUNITY): Payer: Medicare Other

## 2014-12-27 ENCOUNTER — Encounter (HOSPITAL_COMMUNITY): Payer: Self-pay | Admitting: Radiology

## 2014-12-27 DIAGNOSIS — D61818 Other pancytopenia: Secondary | ICD-10-CM

## 2014-12-27 DIAGNOSIS — N186 End stage renal disease: Secondary | ICD-10-CM

## 2014-12-27 DIAGNOSIS — I340 Nonrheumatic mitral (valve) insufficiency: Secondary | ICD-10-CM

## 2014-12-27 DIAGNOSIS — K7030 Alcoholic cirrhosis of liver without ascites: Secondary | ICD-10-CM

## 2014-12-27 DIAGNOSIS — B9620 Unspecified Escherichia coli [E. coli] as the cause of diseases classified elsewhere: Secondary | ICD-10-CM

## 2014-12-27 DIAGNOSIS — Z992 Dependence on renal dialysis: Secondary | ICD-10-CM

## 2014-12-27 DIAGNOSIS — I959 Hypotension, unspecified: Secondary | ICD-10-CM

## 2014-12-27 LAB — COMPREHENSIVE METABOLIC PANEL
ALK PHOS: 95 U/L (ref 38–126)
ALT: 6 U/L — ABNORMAL LOW (ref 14–54)
ANION GAP: 9 (ref 5–15)
AST: 64 U/L — ABNORMAL HIGH (ref 15–41)
Albumin: 2.1 g/dL — ABNORMAL LOW (ref 3.5–5.0)
BUN: 20 mg/dL (ref 6–20)
CALCIUM: 6.6 mg/dL — AB (ref 8.9–10.3)
CO2: 26 mmol/L (ref 22–32)
Chloride: 94 mmol/L — ABNORMAL LOW (ref 101–111)
Creatinine, Ser: 5.71 mg/dL — ABNORMAL HIGH (ref 0.44–1.00)
GFR calc non Af Amer: 7 mL/min — ABNORMAL LOW (ref >60)
GFR, EST AFRICAN AMERICAN: 8 mL/min — AB (ref >60)
Glucose, Bld: 73 mg/dL (ref 65–99)
Potassium: 4.4 mmol/L (ref 3.5–5.1)
SODIUM: 129 mmol/L — AB (ref 135–145)
TOTAL PROTEIN: 5.7 g/dL — AB (ref 6.5–8.1)
Total Bilirubin: 1.3 mg/dL — ABNORMAL HIGH (ref 0.3–1.2)

## 2014-12-27 LAB — CBC
HCT: 26.1 % — ABNORMAL LOW (ref 36.0–46.0)
HEMOGLOBIN: 8.9 g/dL — AB (ref 12.0–15.0)
MCH: 33.7 pg (ref 26.0–34.0)
MCHC: 34.1 g/dL (ref 30.0–36.0)
MCV: 98.9 fL (ref 78.0–100.0)
Platelets: 46 10*3/uL — ABNORMAL LOW (ref 150–400)
RBC: 2.64 MIL/uL — AB (ref 3.87–5.11)
RDW: 15.2 % (ref 11.5–15.5)
WBC: 2.1 10*3/uL — ABNORMAL LOW (ref 4.0–10.5)

## 2014-12-27 LAB — HEPATITIS PANEL, ACUTE
HEP A IGM: NEGATIVE
HEP B C IGM: NEGATIVE
HEP B S AG: NEGATIVE

## 2014-12-27 LAB — MRSA PCR SCREENING: MRSA BY PCR: NEGATIVE

## 2014-12-27 MED ORDER — DEXTROSE 5 % IV SOLN
2.0000 g | INTRAVENOUS | Status: DC
  Administered 2014-12-28: 2 g via INTRAVENOUS
  Filled 2014-12-27: qty 2

## 2014-12-27 MED ORDER — CALCIUM ACETATE (PHOS BINDER) 667 MG PO CAPS
1334.0000 mg | ORAL_CAPSULE | Freq: Three times a day (TID) | ORAL | Status: DC
  Administered 2014-12-27 – 2014-12-28 (×5): 1334 mg via ORAL
  Filled 2014-12-27 (×5): qty 2

## 2014-12-27 NOTE — Progress Notes (Signed)
Utilization review completed.  

## 2014-12-27 NOTE — Progress Notes (Addendum)
  Progress Note    12/27/2014 8:18 AM Hospital Day 2  Subjective:  Eating breakfast-son in law states arm looks less swollen today  afebrile  Filed Vitals:   12/27/14 0617  BP: 94/42  Pulse: 75  Temp: 98 F (36.7 C)  Resp: 18    Physical Exam: Extremities:  +bruit within fistula; no drainage from left arm or obvious infection.  CBC    Component Value Date/Time   WBC 2.1* 12/27/2014 0530   RBC 2.64* 12/27/2014 0530   HGB 8.9* 12/27/2014 0530   HCT 26.1* 12/27/2014 0530   PLT 46* 12/27/2014 0530   MCV 98.9 12/27/2014 0530   MCH 33.7 12/27/2014 0530   MCHC 34.1 12/27/2014 0530   RDW 15.2 12/27/2014 0530   LYMPHSABS 0.9 12/25/2014 2005   MONOABS 0.3 12/25/2014 2005   EOSABS 0.3 12/25/2014 2005   BASOSABS 0.0 12/25/2014 2005    BMET    Component Value Date/Time   NA 129* 12/27/2014 0530   K 4.4 12/27/2014 0530   CL 94* 12/27/2014 0530   CO2 26 12/27/2014 0530   GLUCOSE 73 12/27/2014 0530   BUN 20 12/27/2014 0530   CREATININE 5.71* 12/27/2014 0530   CALCIUM 6.6* 12/27/2014 0530   GFRNONAA 7* 12/27/2014 0530   GFRAA 8* 12/27/2014 0530    INR    Component Value Date/Time   INR 1.53* 12/26/2014 0513     Intake/Output Summary (Last 24 hours) at 12/27/14 0818 Last data filed at 12/27/14 0700  Gross per 24 hour  Intake    870 ml  Output   1062 ml  Net   -192 ml     Assessment/Plan:  69 y.o. female with bacteremia with possible infected left arm graft Hospital Day 2  -pt remains afebrile -CT of abdomen/pelvis unremarkable -repeat blood cx in progress -continue IV Abx for now per primary team -graft duplex is pending-arm  -no obvious infection left arm, but Dr. Imogene Burn to review results of duplex and possible CT scan of the arm to check for abscess -chronic thrombocytopenia worse today-pt not receiving heparin -chronic leukopenia   Doreatha Massed, PA-C [facility] [phone] 12/27/2014 8:18 AM  Addendum  I have  independently interviewed and examined the patient, and I agree with the physician assistant's findings.  Would obtain CT L arm to better evaluate the soft tissue around the arm given limitation of access duplex.  Her physical exam continues to be underwhelming for a graft infection.  Leonides Sake, MD [facility] [place] Office: [phone] Pager: [phone]  12/27/2014, 8:49 AM   Addendum  I reviewed the L arm access duplex.  There is NO perigraft fluid evident on the recorded images.  Would continue with CT L arm to confirm my suspicion of no significant fluid collections in L arm.   Leonides Sake, MD [facility] [place] Office: [phone] Pager: [phone]  12/27/2014, 10:32 AM

## 2014-12-27 NOTE — Progress Notes (Signed)
Pharmacy Consult - Samantha [name]  69 year old female with ESRD (MWF HD) to begin [place] for E Coli bacteremia Vancomycin and Zosyn stopped  Plan: Samantha [name] 2 grams iv Q HD Continue to follow  Thank you Okey Regal, PharmD [phone]

## 2014-12-27 NOTE — Progress Notes (Signed)
   Daily Progress Note   Radiology: Ct Abdomen Pelvis W Contrast  12/27/2014   CLINICAL DATA:  Diffuse abdominal pain  EXAM: CT ABDOMEN AND PELVIS WITH CONTRAST  TECHNIQUE: Multidetector CT imaging of the abdomen and pelvis was performed using the standard protocol following bolus administration of intravenous contrast.  CONTRAST:  OMNIPAQUE IOHEXOL 300 MG/ML  SOLN  COMPARISON:  08/03/2014  FINDINGS: Lung bases: Imaging at the lung bases is mildly degraded due to motion. The lungs are grossly clear in their visualized aspects. Mild cardiomegaly with trace pericardial fluid.  Liver is nodular in contour with central minimal intrahepatic ductal prominence but no focal mass. Stable degree of mid common duct prominence measuring 1 cm in the setting of previous cholecystectomy, with tapering to the ampulla. Trace perihepatic ascites.  The spleen is at upper limits of normal in size but without focal abnormality. Splenorenal collaterals and esophageal varices are again noted.  Pancreas is unremarkable.  Adrenal glands are normal. Bilateral renal atrophy without hydronephrosis. Imaging of renal parenchyma degraded by motion. 1.1 cm left mid renal cortical cyst.  Stomach distended by ingested material. No bowel wall thickening or focal segmental dilatation is identified. Normal appendix. Large amount of stool within the rectal vault.  Uterus and ovaries are unremarkable.  Moderate atheromatous aortic calcification without aneurysm. No lymphadenopathy.  Soft tissue anasarca is noted.  No free air.  Marrow is extremely inhomogeneous with central depression deformities and disc degenerative change noted predominantly at L3-L4. This appearance is not significantly changed.  IMPRESSION: No acute intra-abdominal or pelvic pathology.  Chronic stable findings as above.   Electronically Signed   By: Christiana Pellant M.D.   On: 12/27/2014 07:18   Ct Extrem Up Entire Arm L Wo/cm  12/27/2014   CLINICAL DATA:  Infected  LEFT arm graft.  Mid forearm swelling.  EXAM: CT OF THE UPPER LEFT ARM WITHOUT CONTRAST  TECHNIQUE: Multidetector CT imaging was performed according to the standard protocol. Multiplanar CT image reconstructions were also generated.  COMPARISON:  Radiographs 09/22/2011.  FINDINGS: Dialysis fistula is present in the LEFT upper extremity. This appears to be a calcified prosthetic graft in the forearm. The vessels in the upper arm are tortuous. The graft demonstrates areas of aneurysmal dilation. There is no soft tissue abscess adjacent to the graft. No convincing evidence of cellulitis by CT. Soft tissue edema is present along the LEFT flank and LEFT chest wall. Reverse LEFT shoulder arthroplasty is present. The radius and ulna appear within normal limits. Grossly, the hand appears within normal limits.  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   Electronically Signed   By: Andreas Newport M.D.   On: 12/27/2014 12:25    I have reviewed the CT Left arm and there is NO fluid collection or inflammation consistent with infection.  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  If the patient develops any bleeding, she will likely need revision of the forearm loop arteriovenous graft with an interposition graft.  - At this point, would continue with abx to try to salvage this graft. - Available as needed.  Leonides Sake, MD [facility] [place] Office: [phone] Pager: [phone]  12/27/2014, 7:25 PM

## 2014-12-27 NOTE — Progress Notes (Signed)
Subjective:   No complaints  Objective Filed Vitals:   12/26/14 1918 12/26/14 2014 12/27/14 0159 12/27/14 0617  BP: 100/60 86/47 90/47  94/42  Pulse: 83 79  75  Temp: 98.2 F (36.8 C) 97.9 F (36.6 C)  98 F (36.7 C)  TempSrc: Oral     Resp: Height:      Weight: 86.6 kg (190 lb 14.7 oz)     SpO2: 100% 100%  99%   Physical Exam General: alert and oriented. No acute distress Heart: RRR  Lungs: CTA, unlabored Abdomen: obese, soft, nontender +BS  Extremities: no edema  Dialysis Access:  L AVG +b/t   Dialysis Orders: [facility] 4 hrs 77kgs 3K/2.5ca 2000u heparin L AVG Profile 4  venofer 50 q week hectorol 4  Assessment/Plan: 1. Bacteremia- + blood cultures from 9/7 at outpt HD center for Gram negative bacilli- Ecoli. Vanc and zosyn. Cultures repeated on admission Outpatient blood cx's sent for a new blister over the L arm AVG on 9/7; the blood cultures grew + for [facility] and pt was sent to hospital for admission. Has not been febrile. CT abd negative for source of gram neg bacteremia.  VVS following and has ordered CT of the arm. On vanc/ zosyn. Blood cx here from 9/9 is negative. She has no abd symptoms.  Has had some pain over the graft area. 2. Blister AVG- seen by VVS-duplex to check for fluid around AVG- recommend CT of arm- continue antibiotics.  3. ESRD - MWF [place]- HD tomorrow 4. Hypertension/volume - Hypotension here- asymptomatic/ alert and oriented.BP variable at outpt center- pre HD SBP range 104-200. Post SBP 90-140s. Home meds norvasc and metop on hold. Occasional high gains- not reaching edw. Echo pending 5. Anemia - hgb 8.9 dropping- start ESA. Cont Fe. outpt hghb 11.8 on 9/7- check stool- history of GIB. Hold heparin in HD tomorrow 6. Metabolic bone disease - PTH 97 phos 6.6- hold sensipar- PTH down from 235. Cont hectorol. Corr Ca+ low 8s- 2.5 ca bath 7. Nutrition - renal diet. vitamin   Jetty Duhamel, NP [facility] Beeper [phone] 12/27/2014,8:47 AM  LOS: 2 days   Pt seen, examined, agree w assess/plan as above with additions as indicated.  Vinson Moselle MD pager [phone]    cell [phone] 12/27/2014, 11:55 AM      Additional Objective Labs: Basic Metabolic Panel:  Recent Labs Lab 12/25/14 2005 12/26/14 0513 12/27/14 0530  NA 132* 135 129*  K 5.1 4.6 4.4  CL 97* 100* 94*  CO2 22 18* 26  GLUCOSE 73 71 73  BUN 47* 51* 20  CREATININE 8.72* 9.52* 5.71*  CALCIUM 7.2* 6.6* 6.6*  PHOS  --  6.6*  --    Liver Function Tests:  Recent Labs Lab 12/25/14 2318 12/26/14 0513 12/27/14 0530  AST 72* 52* 64*  ALT 11* 6* 6*  ALKPHOS 156* 114 95  BILITOT 1.0 1.2 1.3*  PROT 7.4 6.7 5.7*  ALBUMIN 3.0* 2.5* 2.1*   No results for input(s): LIPASE, AMYLASE in the last 168 hours. CBC:  Recent Labs Lab 12/25/14 2005 12/26/14 0513 12/27/14 0530  WBC 2.5* 2.2* 2.1*  NEUTROABS 1.0*  --   --   HGB 10.8* 9.6* 8.9*  HCT 30.6* 28.1* 26.1*  MCV 97.1 98.9 98.9  PLT 71* 60* 46*   Blood Culture    Component Value Date/Time   SDES BLOOD RIGHT ARM 12/25/2014 2022   SPECREQUEST BOTTLES DRAWN AEROBIC AND ANAEROBIC  12/25/2014 2022   CULT NO GROWTH < 12 HOURS 12/25/2014 2022   REPTSTATUS PENDING 12/25/2014 2022    Cardiac Enzymes: No results for input(s): CKTOTAL, CKMB, CKMBINDEX, TROPONINI in the last 168 hours. CBG:  Recent Labs Lab 12/26/14 0044 12/26/14 0136 12/26/14 2012 12/26/14 2314  GLUCAP 53* 92 66 75   Iron Studies: No results for input(s): IRON, TIBC, TRANSFERRIN, FERRITIN in the last 72 hours. @lablastinr3 @ Studies/Results: Dg Chest 2 View  12/25/2014   CLINICAL DATA:  Patient from dialysis with infected shunt. Renal failure. Cough.  EXAM: CHEST  2 VIEW  COMPARISON:  None.  FINDINGS: Enlarged cardiac silhouette. BILATERAL diffuse pulmonary opacities suggestive of edema. No effusion or pneumothorax. Osteopenia. BILATERAL shoulder replacements.  IMPRESSION:  Cardiomegaly with developing pulmonary edema.   Electronically Signed   By: Elsie Stain M.D.   On: 12/25/2014 19:12   Ct Abdomen Pelvis W Contrast  12/27/2014   CLINICAL DATA:  Diffuse abdominal pain  EXAM: CT ABDOMEN AND PELVIS WITH CONTRAST  TECHNIQUE: Multidetector CT imaging of the abdomen and pelvis was performed using the standard protocol following bolus administration of intravenous contrast.  CONTRAST:  OMNIPAQUE IOHEXOL 300 MG/ML  SOLN  COMPARISON:  08/03/2014  FINDINGS: Lung bases: Imaging at the lung bases is mildly degraded due to motion. The lungs are grossly clear in their visualized aspects. Mild cardiomegaly with trace pericardial fluid.  Liver is nodular in contour with central minimal intrahepatic ductal prominence but no focal mass. Stable degree of mid common duct prominence measuring 1 cm in the setting of previous cholecystectomy, with tapering to the ampulla. Trace perihepatic ascites.  The spleen is at upper limits of normal in size but without focal abnormality. Splenorenal collaterals and esophageal varices are again noted.  Pancreas is unremarkable.  Adrenal glands are normal. Bilateral renal atrophy without hydronephrosis. Imaging of renal parenchyma degraded by motion. 1.1 cm left mid renal cortical cyst.  Stomach distended by ingested material. No bowel wall thickening or focal segmental dilatation is identified. Normal appendix. Large amount of stool within the rectal vault.  Uterus and ovaries are unremarkable.  Moderate atheromatous aortic calcification without aneurysm. No lymphadenopathy.  Soft tissue anasarca is noted.  No free air.  Marrow is extremely inhomogeneous with central depression deformities and disc degenerative change noted predominantly at L3-L4. This appearance is not significantly changed.  IMPRESSION: No acute intra-abdominal or pelvic pathology.  Chronic stable findings as above.   Electronically Signed   By: Christiana Pellant M.D.   On: 12/27/2014  07:18   Medications:   . calcium acetate  1,334 mg Oral TID WC  . levothyroxine  50 mcg Oral QAC breakfast  . multivitamin  1 tablet Oral QHS  . pantoprazole  20 mg Oral Daily  . piperacillin-tazobactam  2.25 g Intravenous Q8H  . sodium chloride  3 mL Intravenous Q12H  . sodium chloride  3 mL Intravenous Q12H  . [START ON 12/28/2014] vancomycin  750 mg Intravenous Q M,W,F-HD

## 2014-12-27 NOTE — Progress Notes (Signed)
  Echocardiogram 2D Echocardiogram has been performed.  Delcie Roch 12/27/2014, 9:42 AM

## 2014-12-27 NOTE — Consult Note (Signed)
[facility]  Date of Admission:  12/25/2014  Date of Consult:  12/27/2014  Reason for Consult: Bacteremia, E coli Referring Physician: Broadus John  Impression/Recommendation E coli bacteremia (R-amp, ? Unasyn, R levaquin, R bactrim)  Will change her anbx to ceftaz with hd  Stop vanco  Repeat BCx pending  Await CT of her LUE  ESRD  Hepatitis B S Ab+  She is hep B immune.   Her Hep B S Ag is negative  Will check her hep B DNA as this is listed in her PMHx but I am doubtful she has Hep B by this  serologic pattern  Pancytopenia  Not sure if this is due to GNR infection or simply her baseline (existed in her labs earlier this year)  Thank you so much for this interesting consult,   Bobby Rumpf (pager) [phone] [email or web address]  Samantha [name] is an 69 y.o. female.  HPI: 69 yo F with hx of ESRD, Hepatitis B/Cirrhosis, adm on 9-9 with 1 week of pain and then 4 day hx of blistering on her LUE HD AVG.  This graft was initially placed at outside hospital 4-5 years ago. It was revised here in 2014. 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  She missed her HD session on 9-9 due to suspicion of infected AVG.  She was started on vanco/zosyn in ED. She has undergone duplex of her AVG and this was not felt to show infection.  She has been hypotensive in hospital, afebrile, mild neutropenia (ANC 1000)  Past Medical History  Diagnosis Date  . Shoulder pain   . Headache(784.0)     occasionally  . Dizziness   . Occasional numbness/prickling/tingling of fingers and toes   . Arthritis     left shoulder  . Joint pain   . Joint swelling   . Gastric ulcer   . Dry skin   . Peripheral edema   . Constipation   . Oligouria   . H/O: GI bleed   . Hepatitis     Hep B  . History of kidney stones   . History of blood transfusion   . Hypertension   . Anemia   . Hypothyroidism     takes Synthroid daily  . Impaired hearing   . Diabetes  mellitus     borderline  . Renal disorder     m, w, F     Past Surgical History  Procedure Laterality Date  . Shoulder surgery  3-100yr ago    right replacement  . Esophagogastroduodenoscopy  06/09/2011    Procedure: ESOPHAGOGASTRODUODENOSCOPY (EGD);  Surgeon: PBeryle Beams MD;  [facility];  Service: Endoscopy;  Laterality: N/A;  . Cataract surgery      bilateral  . Reverse shoulder arthroplasty  09/22/2011    Procedure: REVERSE SHOULDER ARTHROPLASTY;  Surgeon: SAugustin Schooling MD;  [facility]  Service: Orthopedics;  Laterality: Left;  left reverse shoulder arthroplasty  . Cholecystectomy  12/26/2011  . Cholecystectomy  12/26/2011    Procedure: LAPAROSCOPIC CHOLECYSTECTOMY;  Surgeon: EGayland Curry MD,FACS;  [facility]  Service: General;  Laterality: N/A;  . Arteriovenous graft placement Left     forearm  . Shuntogram Left 08/29/2012    Procedure: SHUNTOGRAM;  Surgeon: BConrad Dawson MD;  [facility];  Service: Cardiovascular;  Laterality: Left;  arm     Allergies  Allergen Reactions  . Peanut Butter Flavor   .  Tomato   . Banana Other (See Comments)    Due to dialysis  . Chocolate Other (See Comments)    Due to dialysis  . Fish-Derived Products Other (See Comments)    Due to gout    Medications:  Scheduled: . calcium acetate  1,334 mg Oral TID WC  . levothyroxine  50 mcg Oral QAC breakfast  . multivitamin  1 tablet Oral QHS  . pantoprazole  20 mg Oral Daily  . piperacillin-tazobactam  2.25 g Intravenous Q8H  . sodium chloride  3 mL Intravenous Q12H  . sodium chloride  3 mL Intravenous Q12H  . [START ON 12/28/2014] vancomycin  750 mg Intravenous Q M,W,F-HD    Abtx:  Anti-infectives    Start     Dose/Rate Route Frequency Ordered Stop   12/28/14 1200  vancomycin (VANCOCIN) IVPB 750 mg/150 ml premix     750 mg 150 mL/hr over 60 Minutes Intravenous Every M-W-F (Hemodialysis) 12/26/14 1157     12/26/14 0400  piperacillin-tazobactam (ZOSYN)  IVPB 2.25 g     2.25 g 100 mL/hr over 30 Minutes Intravenous Every 8 hours 12/25/14 2146     12/25/14 2147  vancomycin (VANCOCIN) 750 mg in sodium chloride 0.9 % 150 mL IVPB  Status:  Discontinued     750 mg 150 mL/hr over 60 Minutes Intravenous Every Dialysis 12/25/14 2148 12/27/14 1206   12/25/14 1915  vancomycin (VANCOCIN) 1,750 mg in sodium chloride 0.9 % 500 mL IVPB     1,750 mg 250 mL/hr over 120 Minutes Intravenous  Once 12/25/14 1905 12/26/14 0006   12/25/14 1815  piperacillin-tazobactam (ZOSYN) IVPB 3.375 g     3.375 g 100 mL/hr over 30 Minutes Intravenous  Once 12/25/14 1804 12/25/14 2114      Total days of antibiotics: 1 vanco/zosyn          Social History:  reports that she has never smoked. Her smokeless tobacco use includes Chew. She reports that she does not drink alcohol or use illicit drugs.  Family History  Problem Relation Age of Onset  . Hypertension Mother   . Diabetes Mother   . Cancer Brother     General ROS: unobtainable, pt communicates poorly. per family has not passed urine in hospital, not clear of last BM, see HPI.   Blood pressure 79/41, pulse 72, temperature 98 F (36.7 C), temperature source Oral, resp. rate 18, height 4' 9.87" (1.47 m), weight 86.6 kg (190 lb 14.7 oz), SpO2 100 %. General appearance: alert and no distress Eyes: negative findings: EOMI Throat: normal findings: oropharynx pink & moist without lesions or evidence of thrush Neck: no adenopathy and supple, symmetrical, trachea midline Lungs: clear to auscultation bilaterally Heart: regular rate and rhythm Abdomen: normal findings: bowel sounds normal and soft, non-tender Extremities: edema nonpitting, BLE Skin: tenderness on medial edge of AVF. no fluctuance felt. increase heat noted.    Results for orders placed or performed during the hospital encounter of 12/25/14 (from the past 48 hour(s))  I-Stat CG4 Lactic Acid, ED     Status: Abnormal   Collection Time: 12/25/14  6:22 PM    Result Value Ref Range   Lactic Acid, Venous 2.02 (HH) 0.5 - 2.0 mmol/L   Comment NOTIFIED PHYSICIAN   Basic metabolic panel     Status: Abnormal   Collection Time: 12/25/14  8:05 PM  Result Value Ref Range   Sodium 132 (L) 135 - 145 mmol/L   Potassium 5.1 3.5 - 5.1 mmol/L  Comment: HEMOLYSIS AT THIS LEVEL MAY AFFECT RESULT   Chloride 97 (L) 101 - 111 mmol/L   CO2 22 22 - 32 mmol/L   Glucose, Bld 73 65 - 99 mg/dL   BUN 47 (H) 6 - 20 mg/dL   Creatinine, Ser 8.72 (H) 0.44 - 1.00 mg/dL   Calcium 7.2 (L) 8.9 - 10.3 mg/dL   GFR calc non Af Amer 4 (L) >60 mL/min   GFR calc Af Amer 5 (L) >60 mL/min    Comment: (NOTE) The eGFR has been calculated using the CKD EPI equation. This calculation has not been validated in all clinical situations. eGFR's persistently <60 mL/min signify possible Chronic Kidney Disease.    Anion gap 13 5 - 15  CBC with Differential/Platelet     Status: Abnormal   Collection Time: 12/25/14  8:05 PM  Result Value Ref Range   WBC 2.5 (L) 4.0 - 10.5 K/uL   RBC 3.15 (L) 3.87 - 5.11 MIL/uL   Hemoglobin 10.8 (L) 12.0 - 15.0 g/dL   HCT 30.6 (L) 36.0 - 46.0 %   MCV 97.1 78.0 - 100.0 fL   MCH 34.3 (H) 26.0 - 34.0 pg   MCHC 35.3 30.0 - 36.0 g/dL   RDW 15.5 11.5 - 15.5 %   Platelets 71 (L) 150 - 400 K/uL    Comment: SPECIMEN CHECKED FOR CLOTS REPEATED TO VERIFY PLATELET COUNT CONFIRMED BY SMEAR    Neutrophils Relative % 39 (L) 43 - 77 %   Neutro Abs 1.0 (L) 1.7 - 7.7 K/uL   Lymphocytes Relative 38 12 - 46 %   Lymphs Abs 0.9 0.7 - 4.0 K/uL   Monocytes Relative 11 3 - 12 %   Monocytes Absolute 0.3 0.1 - 1.0 K/uL   Eosinophils Relative 11 (H) 0 - 5 %   Eosinophils Absolute 0.3 0.0 - 0.7 K/uL   Basophils Relative 1 0 - 1 %   Basophils Absolute 0.0 0.0 - 0.1 K/uL  Culture, blood (routine x 2)     Status: None (Preliminary result)   Collection Time: 12/25/14  8:22 PM  Result Value Ref Range   Specimen Description BLOOD RIGHT ARM    Special Requests BOTTLES  DRAWN AEROBIC AND ANAEROBIC 5ML    Culture NO GROWTH < 12 HOURS    Report Status PENDING   Hepatic function panel     Status: Abnormal   Collection Time: 12/25/14 11:18 PM  Result Value Ref Range   Total Protein 7.4 6.5 - 8.1 g/dL   Albumin 3.0 (L) 3.5 - 5.0 g/dL   AST 72 (H) 15 - 41 U/L   ALT 11 (L) 14 - 54 U/L   Alkaline Phosphatase 156 (H) 38 - 126 U/L   Total Bilirubin 1.0 0.3 - 1.2 mg/dL   Bilirubin, Direct 0.5 0.1 - 0.5 mg/dL   Indirect Bilirubin 0.5 0.3 - 0.9 mg/dL  Protime-INR     Status: Abnormal   Collection Time: 12/25/14 11:18 PM  Result Value Ref Range   Prothrombin Time 17.6 (H) 11.6 - 15.2 seconds   INR 1.44 0.00 - 1.49  Ammonia     Status: Abnormal   Collection Time: 12/25/14 11:18 PM  Result Value Ref Range   Ammonia 47 (H) 9 - 35 umol/L  Glucose, capillary     Status: Abnormal   Collection Time: 12/26/14 12:44 AM  Result Value Ref Range   Glucose-Capillary 53 (L) 65 - 99 mg/dL  Ammonia     Status: Abnormal  Collection Time: 12/26/14  1:21 AM  Result Value Ref Range   Ammonia 99 (H) 9 - 35 umol/L  Protime-INR     Status: Abnormal   Collection Time: 12/26/14  1:21 AM  Result Value Ref Range   Prothrombin Time 17.8 (H) 11.6 - 15.2 seconds   INR 1.46 0.00 - 1.49  Hepatitis panel, acute     Status: None   Collection Time: 12/26/14  1:21 AM  Result Value Ref Range   Hepatitis B Surface Ag Negative Negative   HCV Ab <0.1 0.0 - 0.9 s/co ratio    Comment: (NOTE)                                  Negative:     < 0.8                             Indeterminate: 0.8 - 0.9                                  Positive:     > 0.9 The CDC recommends that a positive HCV antibody result be followed up with a HCV Nucleic Acid Amplification test ([id number]). Performed At: [facility] [place], [place] [id number] Lindon Romp MD BS:[id number]    Hep A IgM Negative Negative   Hep B C IgM Negative Negative  Glucose, capillary     Status: None    Collection Time: 12/26/14  1:36 AM  Result Value Ref Range   Glucose-Capillary 92 65 - 99 mg/dL  Magnesium     Status: None   Collection Time: 12/26/14  5:13 AM  Result Value Ref Range   Magnesium 2.0 1.7 - 2.4 mg/dL  Phosphorus     Status: Abnormal   Collection Time: 12/26/14  5:13 AM  Result Value Ref Range   Phosphorus 6.6 (H) 2.5 - 4.6 mg/dL  TSH     Status: Abnormal   Collection Time: 12/26/14  5:13 AM  Result Value Ref Range   TSH 10.891 (H) 0.350 - 4.500 uIU/mL  Comprehensive metabolic panel     Status: Abnormal   Collection Time: 12/26/14  5:13 AM  Result Value Ref Range   Sodium 135 135 - 145 mmol/L   Potassium 4.6 3.5 - 5.1 mmol/L   Chloride 100 (L) 101 - 111 mmol/L   CO2 18 (L) 22 - 32 mmol/L   Glucose, Bld 71 65 - 99 mg/dL   BUN 51 (H) 6 - 20 mg/dL   Creatinine, Ser 9.52 (H) 0.44 - 1.00 mg/dL   Calcium 6.6 (L) 8.9 - 10.3 mg/dL   Total Protein 6.7 6.5 - 8.1 g/dL   Albumin 2.5 (L) 3.5 - 5.0 g/dL   AST 52 (H) 15 - 41 U/L   ALT 6 (L) 14 - 54 U/L   Alkaline Phosphatase 114 38 - 126 U/L   Total Bilirubin 1.2 0.3 - 1.2 mg/dL   GFR calc non Af Amer 4 (L) >60 mL/min   GFR calc Af Amer 4 (L) >60 mL/min    Comment: (NOTE) The eGFR has been calculated using the CKD EPI equation. This calculation has not been validated in all clinical situations. eGFR's persistently <60 mL/min signify possible Chronic Kidney Disease.    Anion gap 17 (H) 5 -  15  CBC     Status: Abnormal   Collection Time: 12/26/14  5:13 AM  Result Value Ref Range   WBC 2.2 (L) 4.0 - 10.5 K/uL   RBC 2.84 (L) 3.87 - 5.11 MIL/uL   Hemoglobin 9.6 (L) 12.0 - 15.0 g/dL   HCT 28.1 (L) 36.0 - 46.0 %   MCV 98.9 78.0 - 100.0 fL   MCH 33.8 26.0 - 34.0 pg   MCHC 34.2 30.0 - 36.0 g/dL   RDW 15.3 11.5 - 15.5 %   Platelets 60 (L) 150 - 400 K/uL    Comment: CONSISTENT WITH PREVIOUS RESULT  Protime-INR     Status: Abnormal   Collection Time: 12/26/14  5:13 AM  Result Value Ref Range   Prothrombin Time 18.4 (H)  11.6 - 15.2 seconds   INR 1.53 (H) 0.00 - 1.49  Prealbumin     Status: None   Collection Time: 12/26/14  5:13 AM  Result Value Ref Range   Prealbumin 20.0 18 - 38 mg/dL  T4, free     Status: None   Collection Time: 12/26/14  9:20 AM  Result Value Ref Range   Free T4 0.75 0.61 - 1.12 ng/dL  Glucose, capillary     Status: None   Collection Time: 12/26/14  8:12 PM  Result Value Ref Range   Glucose-Capillary 66 65 - 99 mg/dL  Glucose, capillary     Status: None   Collection Time: 12/26/14 11:14 PM  Result Value Ref Range   Glucose-Capillary 75 65 - 99 mg/dL  MRSA PCR Screening     Status: None   Collection Time: 12/27/14  1:44 AM  Result Value Ref Range   MRSA by PCR NEGATIVE NEGATIVE    Comment:        The GeneXpert MRSA Assay (FDA approved for NASAL specimens only), is one component of a comprehensive MRSA colonization surveillance program. It is not intended to diagnose MRSA infection nor to guide or monitor treatment for MRSA infections.   Comprehensive metabolic panel     Status: Abnormal   Collection Time: 12/27/14  5:30 AM  Result Value Ref Range   Sodium 129 (L) 135 - 145 mmol/L   Potassium 4.4 3.5 - 5.1 mmol/L   Chloride 94 (L) 101 - 111 mmol/L   CO2 26 22 - 32 mmol/L   Glucose, Bld 73 65 - 99 mg/dL   BUN 20 6 - 20 mg/dL   Creatinine, Ser 5.71 (H) 0.44 - 1.00 mg/dL    Comment: DELTA CHECK NOTED   Calcium 6.6 (L) 8.9 - 10.3 mg/dL   Total Protein 5.7 (L) 6.5 - 8.1 g/dL   Albumin 2.1 (L) 3.5 - 5.0 g/dL   AST 64 (H) 15 - 41 U/L   ALT 6 (L) 14 - 54 U/L   Alkaline Phosphatase 95 38 - 126 U/L   Total Bilirubin 1.3 (H) 0.3 - 1.2 mg/dL   GFR calc non Af Amer 7 (L) >60 mL/min   GFR calc Af Amer 8 (L) >60 mL/min    Comment: (NOTE) The eGFR has been calculated using the CKD EPI equation. This calculation has not been validated in all clinical situations. eGFR's persistently <60 mL/min signify possible Chronic Kidney Disease.    Anion gap 9 5 - 15  CBC     Status:  Abnormal   Collection Time: 12/27/14  5:30 AM  Result Value Ref Range   WBC 2.1 (L) 4.0 - 10.5 K/uL   RBC 2.64 (  L) 3.87 - 5.11 MIL/uL   Hemoglobin 8.9 (L) 12.0 - 15.0 g/dL   HCT 26.1 (L) 36.0 - 46.0 %   MCV 98.9 78.0 - 100.0 fL   MCH 33.7 26.0 - 34.0 pg   MCHC 34.1 30.0 - 36.0 g/dL   RDW 15.2 11.5 - 15.5 %   Platelets 46 (L) 150 - 400 K/uL    Comment: CONSISTENT WITH PREVIOUS RESULT      Component Value Date/Time   SDES BLOOD RIGHT ARM 12/25/2014 2022   SPECREQUEST BOTTLES DRAWN AEROBIC AND ANAEROBIC 5ML 12/25/2014 2022   CULT NO GROWTH < 12 HOURS 12/25/2014 2022   REPTSTATUS PENDING 12/25/2014 2022   Dg Chest 2 View  12/25/2014   CLINICAL DATA:  Patient from dialysis with infected shunt. Renal failure. Cough.  EXAM: CHEST  2 VIEW  COMPARISON:  None.  FINDINGS: Enlarged cardiac silhouette. BILATERAL diffuse pulmonary opacities suggestive of edema. No effusion or pneumothorax. Osteopenia. BILATERAL shoulder replacements.  IMPRESSION: Cardiomegaly with developing pulmonary edema.   Electronically Signed   By: Staci Righter M.D.   On: 12/25/2014 19:12   Ct Abdomen Pelvis W Contrast  12/27/2014   CLINICAL DATA:  Diffuse abdominal pain  EXAM: CT ABDOMEN AND PELVIS WITH CONTRAST  TECHNIQUE: Multidetector CT imaging of the abdomen and pelvis was performed using the standard protocol following bolus administration of intravenous contrast.  CONTRAST:  149m OMNIPAQUE IOHEXOL 300 MG/ML  SOLN  COMPARISON:  08/03/2014  FINDINGS: Lung bases: Imaging at the lung bases is mildly degraded due to motion. The lungs are grossly clear in their visualized aspects. Mild cardiomegaly with trace pericardial fluid.  Liver is nodular in contour with central minimal intrahepatic ductal prominence but no focal mass. Stable degree of mid common duct prominence measuring 1 cm in the setting of previous cholecystectomy, with tapering to the ampulla. Trace perihepatic ascites.  The spleen is at upper limits of normal in  size but without focal abnormality. Splenorenal collaterals and esophageal varices are again noted.  Pancreas is unremarkable.  Adrenal glands are normal. Bilateral renal atrophy without hydronephrosis. Imaging of renal parenchyma degraded by motion. 1.1 cm left mid renal cortical cyst.  Stomach distended by ingested material. No bowel wall thickening or focal segmental dilatation is identified. Normal appendix. Large amount of stool within the rectal vault.  Uterus and ovaries are unremarkable.  Moderate atheromatous aortic calcification without aneurysm. No lymphadenopathy.  Soft tissue anasarca is noted.  No free air.  Marrow is extremely inhomogeneous with central depression deformities and disc degenerative change noted predominantly at L3-L4. This appearance is not significantly changed.  IMPRESSION: No acute intra-abdominal or pelvic pathology.  Chronic stable findings as above.   Electronically Signed   By: GConchita ParisM.D.   On: 12/27/2014 07:18   Ct Extrem Up Entire Arm L Wo/cm  12/27/2014   CLINICAL DATA:  Infected LEFT arm graft.  Mid forearm swelling.  EXAM: CT OF THE UPPER LEFT ARM WITHOUT CONTRAST  TECHNIQUE: Multidetector CT imaging was performed according to the standard protocol. Multiplanar CT image reconstructions were also generated.  COMPARISON:  Radiographs 09/22/2011.  FINDINGS: Dialysis fistula is present in the LEFT upper extremity. This appears to be a calcified prosthetic graft in the forearm. The vessels in the upper arm are tortuous. The graft demonstrates areas of aneurysmal dilation. There is no soft tissue abscess adjacent to the graft. No convincing evidence of cellulitis by CT. Soft tissue edema is present along the LEFT flank and LEFT  chest wall. Reverse LEFT shoulder arthroplasty is present. The radius and ulna appear within normal limits. Grossly, the hand appears within normal limits.  IMPRESSION: Areas of aneurysmal dilation in the LEFT forearm dialysis graft but no  soft tissue abscess.   Electronically Signed   By: Dereck Ligas M.D.   On: 12/27/2014 12:25   Recent Results (from the past 240 hour(s))  Culture, blood (routine x 2)     Status: None (Preliminary result)   Collection Time: 12/25/14  8:22 PM  Result Value Ref Range Status   Specimen Description BLOOD RIGHT ARM  Final   Special Requests BOTTLES DRAWN AEROBIC AND ANAEROBIC 5ML  Final   Culture NO GROWTH < 12 HOURS  Final   Report Status PENDING  Incomplete  MRSA PCR Screening     Status: None   Collection Time: 12/27/14  1:44 AM  Result Value Ref Range Status   MRSA by PCR NEGATIVE NEGATIVE Final    Comment:        The GeneXpert MRSA Assay (FDA approved for NASAL specimens only), is one component of a comprehensive MRSA colonization surveillance program. It is not intended to diagnose MRSA infection nor to guide or monitor treatment for MRSA infections.       12/27/2014, 1:37 PM     LOS: 2 days    Records and images were personally reviewed where available.

## 2014-12-27 NOTE — Progress Notes (Signed)
[facility] PROGRESS NOTE  Samantha [name] ZOX:[id number] DOB: 11-02-1945 DOA: 12/25/2014 PCP: Dorrene German, MD  Assessment/Plan: 1. Ecoli bacteremia -from Dialysis center, both sets positive -source likely AVG related, Duplex US unremarkable per Dr.Chen -CT abd pelvis unremarkable -repeat Blood Cx NGTD -CT LUE ordered by Dr.Chen -VVS following -will ask ID for input  2. 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 ESRD with AVG issues -per VVS, ok to use -FU [place] -HD per renal  5. Anemia of chronic disease -stable  6. Hypertension -BP fluctuates, on lower side now, but not symptomatic -monitor, norvasc and metoprolol on hold  7. 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 deficits too -TSH profoundly low, T4 low normal, started Synthroid  DVT proph: SCDs due to low plts  Code Status: Full Code Family Communication: none at bedside Disposition Plan: when stable   Consultants:  Renal  VVS   HPI/Subjective: Feels ok, very hard of hearing  Objective: Filed Vitals:   12/27/14 0700  BP: 79/41  Pulse: 72  Temp: 98 F (36.7 C)  Resp: 18    Intake/Output Summary (Last 24 hours) at 12/27/14 1334 Last data filed at 12/27/14 0851  Gross per 24 hour  Intake    750 ml  Output   1062 ml  Net   -312 ml   Filed Weights   12/26/14 0035 12/26/14 1448 12/26/14 1918  Weight: 83.3 kg (183 lb 10.3 oz) 87.8 kg (193 lb 9 oz) 86.6 kg (190 lb 14.7 oz)    Exam:   General:  AAOx2, very hard of hearing, cognitive deficits  Cardiovascular: S1S2/RRR  Respiratory: CTAB  Abdomen: soft, NT, BS present  Musculoskeletal: no edema c/c  L arm AVG   Data Reviewed: Basic Metabolic Panel:  Recent Labs Lab 12/25/14 2005  12/26/14 0513 12/27/14 0530  NA 132* 135 129*  K 5.1 4.6 4.4  CL 97* 100* 94*  CO2 22 18* 26  GLUCOSE 73 71 73  BUN 47* 51* 20  CREATININE 8.72* 9.52* 5.71*  CALCIUM 7.2* 6.6* 6.6*  MG  --  2.0  --   PHOS  --  6.6*  --    Liver Function Tests:  Recent Labs Lab 12/25/14 2318 12/26/14 0513 12/27/14 0530  AST 72* 52* 64*  ALT 11* 6* 6*  ALKPHOS 156* 114 95  BILITOT 1.0 1.2 1.3*  PROT 7.4 6.7 5.7*  ALBUMIN 3.0* 2.5* 2.1*   No results for input(s): LIPASE, AMYLASE in the last 168 hours.  Recent Labs Lab 12/25/14 2318 12/26/14 0121  AMMONIA 47* 99*   CBC:  Recent Labs Lab 12/25/14 2005 12/26/14 0513 12/27/14 0530  WBC 2.5* 2.2* 2.1*  NEUTROABS 1.0*  --   --   HGB 10.8* 9.6* 8.9*  HCT 30.6* 28.1* 26.1*  MCV 97.1 98.9 98.9  PLT 71* 60* 46*   Cardiac Enzymes: No results for input(s): CKTOTAL, CKMB, CKMBINDEX, TROPONINI in the last 168 hours. BNP (last 3 results) No results for input(s): BNP in the last 8760 hours.  ProBNP (last 3 results) No results for input(s): PROBNP in the last 8760 hours.  CBG:  Recent Labs Lab 12/26/14 0044 12/26/14 0136 12/26/14 2012 12/26/14 2314  GLUCAP 53* 92 66 75    Recent Results (from the  past 240 hour(s))  Culture, blood (routine x 2)     Status: None (Preliminary result)   Collection Time: 12/25/14  8:22 PM  Result Value Ref Range Status   Specimen Description BLOOD RIGHT ARM  Final   Special Requests BOTTLES DRAWN AEROBIC AND ANAEROBIC  Final   Culture NO GROWTH < 12 HOURS  Final   Report Status PENDING  Incomplete  MRSA PCR Screening     Status: None   Collection Time: 12/27/14  1:44 AM  Result Value Ref Range Status   MRSA by PCR NEGATIVE NEGATIVE Final    Comment:        The GeneXpert MRSA Assay (FDA approved for NASAL specimens only), is one component of a comprehensive MRSA colonization surveillance program. It is not intended to diagnose MRSA infection nor to guide or monitor treatment  for MRSA infections.      Studies: Dg Chest 2 View  12/25/2014   CLINICAL DATA:  Patient from dialysis with infected shunt. Renal failure. Cough.  EXAM: CHEST  2 VIEW  COMPARISON:  None.  FINDINGS: Enlarged cardiac silhouette. BILATERAL diffuse pulmonary opacities suggestive of edema. No effusion or pneumothorax. Osteopenia. BILATERAL shoulder replacements.  IMPRESSION: Cardiomegaly with developing pulmonary edema.   Electronically Signed   By: Elsie Stain M.D.   On: 12/25/2014 19:12   Ct Abdomen Pelvis W Contrast  12/27/2014   CLINICAL DATA:  Diffuse abdominal pain  EXAM: CT ABDOMEN AND PELVIS WITH CONTRAST  TECHNIQUE: Multidetector CT imaging of the abdomen and pelvis was performed using the standard protocol following bolus administration of intravenous contrast.  CONTRAST:  OMNIPAQUE IOHEXOL 300 MG/ML  SOLN  COMPARISON:  08/03/2014  FINDINGS: Lung bases: Imaging at the lung bases is mildly degraded due to motion. The lungs are grossly clear in their visualized aspects. Mild cardiomegaly with trace pericardial fluid.  Liver is nodular in contour with central minimal intrahepatic ductal prominence but no focal mass. Stable degree of mid common duct prominence measuring 1 cm in the setting of previous cholecystectomy, with tapering to the ampulla. Trace perihepatic ascites.  The spleen is at upper limits of normal in size but without focal abnormality. Splenorenal collaterals and esophageal varices are again noted.  Pancreas is unremarkable.  Adrenal glands are normal. Bilateral renal atrophy without hydronephrosis. Imaging of renal parenchyma degraded by motion. 1.1 cm left mid renal cortical cyst.  Stomach distended by ingested material. No bowel wall thickening or focal segmental dilatation is identified. Normal appendix. Large amount of stool within the rectal vault.  Uterus and ovaries are unremarkable.  Moderate atheromatous aortic calcification without aneurysm. No lymphadenopathy.  Soft  tissue anasarca is noted.  No free air.  Marrow is extremely inhomogeneous with central depression deformities and disc degenerative change noted predominantly at L3-L4. This appearance is not significantly changed.  IMPRESSION: No acute intra-abdominal or pelvic pathology.  Chronic stable findings as above.   Electronically Signed   By: Christiana Pellant M.D.   On: 12/27/2014 07:18   Ct Extrem Up Entire Arm L Wo/cm  12/27/2014   CLINICAL DATA:  Infected LEFT arm graft.  Mid forearm swelling.  EXAM: CT OF THE UPPER LEFT ARM WITHOUT CONTRAST  TECHNIQUE: Multidetector CT imaging was performed according to the standard protocol. Multiplanar CT image reconstructions were also generated.  COMPARISON:  Radiographs 09/22/2011.  FINDINGS: Dialysis fistula is present in the LEFT upper extremity. This appears to be a calcified prosthetic graft in the forearm. The vessels in the  upper arm are tortuous. The graft demonstrates areas of aneurysmal dilation. There is no soft tissue abscess adjacent to the graft. No convincing evidence of cellulitis by CT. Soft tissue edema is present along the LEFT flank and LEFT chest wall. Reverse LEFT shoulder arthroplasty is present. The radius and ulna appear within normal limits. Grossly, the hand appears within normal limits.  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   Electronically Signed   By: Andreas Newport M.D.   On: 12/27/2014 12:25    Scheduled Meds: . calcium acetate  1,334 mg Oral TID WC  . levothyroxine  50 mcg Oral QAC breakfast  . multivitamin  1 tablet Oral QHS  . pantoprazole  20 mg Oral Daily  . piperacillin-tazobactam  2.25 g Intravenous Q8H  . sodium chloride  3 mL Intravenous Q12H  . sodium chloride  3 mL Intravenous Q12H  . [START ON 12/28/2014] vancomycin  750 mg Intravenous Q M,W,F-HD   Continuous Infusions:  Antibiotics Given (last 72 hours)    Date/Time Action Medication Dose Rate    12/26/14 0409 Given   piperacillin-tazobactam (ZOSYN) IVPB 2.25 g 2.25 g 100 mL/hr   12/26/14 1830 Given   vancomycin (VANCOCIN) 750 mg in sodium chloride 0.9 % 150 mL IVPB 750 mg 150 mL/hr   12/26/14 2108 Given   piperacillin-tazobactam (ZOSYN) IVPB 2.25 g 2.25 g 100 mL/hr   12/27/14 0601 Given   piperacillin-tazobactam (ZOSYN) IVPB 2.25 g 2.25 g 100 mL/hr   12/27/14 1309 Given   piperacillin-tazobactam (ZOSYN) IVPB 2.25 g 2.25 g 100 mL/hr      Active Problems:   HTN (hypertension)   Anemia in chronic kidney disease (CKD)   Hypotension   End stage renal disease   Thrombocytopenia   Cardiomegaly   Bacteremia   Cirrhosis of liver    Time spent:    [facility]  [facility] Pager [phone]. If 7PM-7AM, please contact night-coverage at [email or web address], password [facility] 12/27/2014, 1:34 PM  LOS: 2 days

## 2014-12-28 DIAGNOSIS — T827XXA Infection and inflammatory reaction due to other cardiac and vascular devices, implants and grafts, initial encounter: Secondary | ICD-10-CM | POA: Insufficient documentation

## 2014-12-28 DIAGNOSIS — T827XXD Infection and inflammatory reaction due to other cardiac and vascular devices, implants and grafts, subsequent encounter: Secondary | ICD-10-CM

## 2014-12-28 DIAGNOSIS — R7881 Bacteremia: Secondary | ICD-10-CM | POA: Insufficient documentation

## 2014-12-28 DIAGNOSIS — Z2251 Carrier of viral hepatitis B: Secondary | ICD-10-CM

## 2014-12-28 LAB — BASIC METABOLIC PANEL
Anion gap: 14 (ref 5–15)
BUN: 32 mg/dL — AB (ref 6–20)
CALCIUM: 6.7 mg/dL — AB (ref 8.9–10.3)
CO2: 23 mmol/L (ref 22–32)
CREATININE: 7.65 mg/dL — AB (ref 0.44–1.00)
Chloride: 89 mmol/L — ABNORMAL LOW (ref 101–111)
GFR calc Af Amer: 6 mL/min — ABNORMAL LOW (ref >60)
GFR, EST NON AFRICAN AMERICAN: 5 mL/min — AB (ref >60)
GLUCOSE: 70 mg/dL (ref 65–99)
Potassium: 4.1 mmol/L (ref 3.5–5.1)
Sodium: 126 mmol/L — ABNORMAL LOW (ref 135–145)

## 2014-12-28 LAB — CBC
HEMATOCRIT: 28.1 % — AB (ref 36.0–46.0)
Hemoglobin: 9.6 g/dL — ABNORMAL LOW (ref 12.0–15.0)
MCH: 33.2 pg (ref 26.0–34.0)
MCHC: 34.2 g/dL (ref 30.0–36.0)
MCV: 97.2 fL (ref 78.0–100.0)
PLATELETS: 48 10*3/uL — AB (ref 150–400)
RBC: 2.89 MIL/uL — ABNORMAL LOW (ref 3.87–5.11)
RDW: 14.9 % (ref 11.5–15.5)
WBC: 2.5 10*3/uL — AB (ref 4.0–10.5)

## 2014-12-28 MED ORDER — DEXTROSE 5 % IV SOLN: 2.0000 g | INTRAVENOUS | Status: DC

## 2014-12-28 MED ORDER — LEVOTHYROXINE SODIUM 50 MCG PO TABS: 50.0000 ug | ORAL_TABLET | Freq: Every day | ORAL | Status: DC

## 2014-12-28 NOTE — Progress Notes (Signed)
INFECTIOUS DISEASE PROGRESS NOTE  Samantha [name] is a 69 y.o. female with  Active Problems:   HTN (hypertension)   Anemia in chronic kidney disease (CKD)   Hypotension   End stage renal disease   Thrombocytopenia   Cardiomegaly   Bacteremia   Cirrhosis of liver  Subjective: Without complaints.   Abtx:  Anti-infectives    Start     Dose/Rate Route Frequency Ordered Stop   12/28/14 1200  vancomycin (VANCOCIN) IVPB 750 mg/150 ml premix  Status:  Discontinued     750 mg 150 mL/hr over 60 Minutes Intravenous Every M-W-F (Hemodialysis) 12/26/14 1157 12/27/14 1404   12/28/14 1200  cefTAZidime (FORTAZ) 2 g in dextrose 5 % 50 mL IVPB     2 g 100 mL/hr over 30 Minutes Intravenous Every M-W-F (Hemodialysis) 12/27/14 1436     12/26/14 0400  piperacillin-tazobactam (ZOSYN) IVPB 2.25 g  Status:  Discontinued     2.25 g 100 mL/hr over 30 Minutes Intravenous Every 8 hours 12/25/14 2146 12/27/14 1404   12/25/14 2147  vancomycin (VANCOCIN) 750 mg in sodium chloride 0.9 % 150 mL IVPB  Status:  Discontinued     750 mg 150 mL/hr over 60 Minutes Intravenous Every Dialysis 12/25/14 2148 12/27/14 1206   12/25/14 1915  vancomycin (VANCOCIN) 1,750 mg in sodium chloride 0.9 % 500 mL IVPB     1,750 mg 250 mL/hr over 120 Minutes Intravenous  Once 12/25/14 1905 12/26/14 0006   12/25/14 1815  piperacillin-tazobactam (ZOSYN) IVPB 3.375 g     3.375 g 100 mL/hr over 30 Minutes Intravenous  Once 12/25/14 1804 12/25/14 2114      Medications:  Scheduled: . calcium acetate  1,334 mg Oral TID WC  . cefTAZidime (FORTAZ)  IV  2 g Intravenous Q M,W,F-HD  . levothyroxine  50 mcg Oral QAC breakfast  . multivitamin  1 tablet Oral QHS  . pantoprazole  20 mg Oral Daily  . sodium chloride  3 mL Intravenous Q12H  . sodium chloride  3 mL Intravenous Q12H    Objective: Vital signs in last 24 hours: Temp:  [97.7 F (36.5 C)-98.5 F (36.9 C)] 98.2 F (36.8 C) (09/12 1723) Pulse Rate:  [73-88] 86 (09/12  1723) Resp:  [1-18] 1 (09/12 1723) BP: (77-135)/(34-72) 135/72 mmHg (09/12 1723) SpO2:  [100 %] 100 % (09/12 1723) Weight:  [86.138 kg (189 lb 14.4 oz)-88.7 kg (195 lb 8.8 oz)] 86.9 kg (191 lb 9.3 oz) (09/12 1354)   General appearance: alert and no distress Resp: clear to auscultation bilaterally Cardio: regular rate and rhythm GI: normal findings: bowel sounds normal and soft, non-tender Extremities: LUE AVG- + bruit  Lab Results  Recent Labs  12/27/14 0530 12/28/14 0419  WBC 2.1* 2.5*  HGB 8.9* 9.6*  HCT 26.1* 28.1*  NA 129* 126*  K 4.4 4.1  CL 94* 89*  CO2 26 23  BUN 20 32*  CREATININE 5.71* 7.65*   Liver Panel  Recent Labs  12/25/14 2318 12/26/14 0513 12/27/14 0530  PROT 7.4 6.7 5.7*  ALBUMIN 3.0* 2.5* 2.1*  AST 72* 52* 64*  ALT 11* 6* 6*  ALKPHOS 156* 114 95  BILITOT 1.0 1.2 1.3*  BILIDIR 0.5  --   --   IBILI 0.5  --   --    Sedimentation Rate No results for input(s): ESRSEDRATE in the last 72 hours. C-Reactive Protein No results for input(s): CRP in the last 72 hours.  Microbiology: Recent Results (from the  past 240 hour(s))  Culture, blood (routine x 2)     Status: None (Preliminary result)   Collection Time: 12/25/14  8:22 PM  Result Value Ref Range Status   Specimen Description BLOOD RIGHT ARM  Final   Special Requests BOTTLES DRAWN AEROBIC AND ANAEROBIC  Final   Culture NO GROWTH 3 DAYS  Final   Report Status PENDING  Incomplete  Culture, blood (routine x 2)     Status: None (Preliminary result)   Collection Time: 12/26/14  5:06 AM  Result Value Ref Range Status   Specimen Description BLOOD RIGHT HAND  Final   Special Requests IN PEDIATRIC BOTTLE 4CC  Final   Culture NO GROWTH 2 DAYS  Final   Report Status PENDING  Incomplete  Culture, blood (routine x 2)     Status: None (Preliminary result)   Collection Time: 12/26/14  3:05 PM  Result Value Ref Range Status   Specimen Description BLOOD LEFT ARM HEMODIALYSIS GRAFT  Final   Special  Requests BOTTLES DRAWN AEROBIC AND ANAEROBIC 10CC  Final   Culture NO GROWTH 2 DAYS  Final   Report Status PENDING  Incomplete  Culture, blood (routine x 2)     Status: None (Preliminary result)   Collection Time: 12/26/14  3:20 PM  Result Value Ref Range Status   Specimen Description BLOOD LEFT ARM HEMODIALYSIS GRAFT  Final   Special Requests BOTTLES DRAWN AEROBIC AND ANAEROBIC 10CC  Final   Culture NO GROWTH 2 DAYS  Final   Report Status PENDING  Incomplete  MRSA PCR Screening     Status: None   Collection Time: 12/27/14  1:44 AM  Result Value Ref Range Status   MRSA by PCR NEGATIVE NEGATIVE Final    Comment:        The GeneXpert MRSA Assay (FDA approved for NASAL specimens only), is one component of a comprehensive MRSA colonization surveillance program. It is not intended to diagnose MRSA infection nor to guide or monitor treatment for MRSA infections.     Studies/Results: Ct Abdomen Pelvis W Contrast  12/27/2014   CLINICAL DATA:  Diffuse abdominal pain  EXAM: CT ABDOMEN AND PELVIS WITH CONTRAST  TECHNIQUE: Multidetector CT imaging of the abdomen and pelvis was performed using the standard protocol following bolus administration of intravenous contrast.  CONTRAST:  OMNIPAQUE IOHEXOL 300 MG/ML  SOLN  COMPARISON:  08/03/2014  FINDINGS: Lung bases: Imaging at the lung bases is mildly degraded due to motion. The lungs are grossly clear in their visualized aspects. Mild cardiomegaly with trace pericardial fluid.  Liver is nodular in contour with central minimal intrahepatic ductal prominence but no focal mass. Stable degree of mid common duct prominence measuring 1 cm in the setting of previous cholecystectomy, with tapering to the ampulla. Trace perihepatic ascites.  The spleen is at upper limits of normal in size but without focal abnormality. Splenorenal collaterals and esophageal varices are again noted.  Pancreas is unremarkable.  Adrenal glands are normal. Bilateral renal  atrophy without hydronephrosis. Imaging of renal parenchyma degraded by motion. 1.1 cm left mid renal cortical cyst.  Stomach distended by ingested material. No bowel wall thickening or focal segmental dilatation is identified. Normal appendix. Large amount of stool within the rectal vault.  Uterus and ovaries are unremarkable.  Moderate atheromatous aortic calcification without aneurysm. No lymphadenopathy.  Soft tissue anasarca is noted.  No free air.  Marrow is extremely inhomogeneous with central depression deformities and disc degenerative change noted predominantly at L3-L4.  This appearance is not significantly changed.  IMPRESSION: No acute intra-abdominal or pelvic pathology.  Chronic stable findings as above.   Electronically Signed   By: Christiana Pellant M.D.   On: 12/27/2014 07:18   Ct Extrem Up Entire Arm L Wo/cm  12/27/2014   CLINICAL DATA:  Infected LEFT arm graft.  Mid forearm swelling.  EXAM: CT OF THE UPPER LEFT ARM WITHOUT CONTRAST  TECHNIQUE: Multidetector CT imaging was performed according to the standard protocol. Multiplanar CT image reconstructions were also generated.  COMPARISON:  Radiographs 09/22/2011.  FINDINGS: Dialysis fistula is present in the LEFT upper extremity. This appears to be a calcified prosthetic graft in the forearm. The vessels in the upper arm are tortuous. The graft demonstrates areas of aneurysmal dilation. There is no soft tissue abscess adjacent to the graft. No convincing evidence of cellulitis by CT. Soft tissue edema is present along the LEFT flank and LEFT chest wall. Reverse LEFT shoulder arthroplasty is present. The radius and ulna appear within normal limits. Grossly, the hand appears within normal limits.  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   Electronically Signed   By: Andreas Newport M.D.   On: 12/27/2014 12:25     Assessment/Plan: E coli bacteremia (R-amp, ? Unasyn, R levaquin, R  bactrim) Will change her anbx to ceftaz with hd repeat BCx are ngtd.    Would plan for her to get 21 days of anbx  Repeat her BCx 1 week after she completes her anbx  Close monitoring of her AVG  ESRD  Hepatitis B S Ab+ Await her hep B DNA   Pancytopenia Not sure if this is due to GNR infection or simply her baseline (existed in her labs earlier this year)  Total days of antibiotics: 2 (ceftaz)  available as needed.          Johny Sax Infectious Diseases (pager) [phone] [email or web address] 12/28/2014, 5:27 PM  LOS: 3 days

## 2014-12-28 NOTE — Care Management Important Message (Signed)
Important Message  Patient Details  Name: Samantha [name] MRN: [id number] Date of Birth: 04/11/1946   Medicare Important Message Given:  Yes-second notification given    Bernadette Hoit 12/28/2014, 12:41 PM

## 2014-12-28 NOTE — Procedures (Signed)
Tolerating hemodialysis.  [name]/o arm pain.  For CT scan. Samantha [name]

## 2014-12-28 NOTE — Progress Notes (Signed)
Discharge instructions and medications discussed with Peyton Najjar, pt's son-in-law.  Prescription given to [facility]. All questions answered.

## 2014-12-28 NOTE — Evaluation (Signed)
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 Pt currently with functional limitations due to the deficits listed below (see PT Problem List).  Pt will benefit from skilled PT to increase their independence and safety with mobility to allow discharge to the venue listed below.    Discussed with MD; OK for dc home from PT standpoint      Follow Up Recommendations Home health PT(pt may decline)    Equipment Recommendations  None recommended by PT    Recommendations for Other Services       Precautions / Restrictions Precautions Precautions: Fall      Mobility  Bed Mobility Overal bed mobility: Modified Independent             General bed mobility comments: incr time  Transfers Overall transfer level: Needs assistance Equipment used: None Transfers: Sit to/from Stand Sit to Stand: Supervision         General transfer comment: Supervision for safety and steadiness after HD  Ambulation/Gait Ambulation/Gait assistance: Min assist Ambulation Distance (Feet): 110 Feet Assistive device: 1 person hand held assist Gait Pattern/deviations: Step-through pattern;Decreased step length - right;Decreased step length - left;Decreased stride length;Decreased weight shift to right;Decreased weight shift to left     General Gait Details: Walked in hallway with unilateral handheld assist; benefits from the UE support, encouraged use of cane when feeling particularly tired (son in law reports they have one at home)  Stairs            Wheelchair Mobility    Modified Rankin (Stroke Patients Only)       Balance Overall balance assessment: Needs assistance           Standing  balance-Leahy Scale: Fair                               Pertinent Vitals/Pain Pain Assessment: No/denies pain    Home Living Family/patient expects to be discharged to:: Private residence Living Arrangements: Children Available Help at Discharge: Family;Available PRN/intermittently Type of Home: House Home Access: Level entry     Home Layout: Two level;Able to live on main level with bedroom/bathroom Home Equipment: Cane - single point (walking stick)      Prior Function Level of Independence: Independent with assistive device(s)         Comments: walks outside to get the mail daily, including after HD; has a cane -- not sure how much she uses it     Hand Dominance        Extremity/Trunk Assessment   Upper Extremity Assessment: Overall WFL for tasks assessed           Lower Extremity Assessment: Generalized weakness;Overall WFL for tasks assessed         Communication   Communication: HOH  Cognition Arousal/Alertness: Awake/alert Behavior During Therapy: WFL for tasks assessed/performed Overall Cognitive Status: Difficult to assess                      General Comments      Exercises        Assessment/Plan    PT Assessment Patient needs continued PT services  PT Diagnosis Generalized weakness   PT Problem List Decreased strength;Decreased  activity tolerance;Decreased balance;Decreased safety awareness;Decreased knowledge of use of DME  PT Treatment Interventions DME instruction;Gait training;Stair training;Functional mobility training;Therapeutic activities;Therapeutic exercise;Balance training;Cognitive remediation   PT Goals (Current goals can be found in the Care Plan section) Acute Rehab PT Goals Patient Stated Goal: REALLY wants to dc home PT Goal Formulation: With patient Time For Goal Achievement: 01/11/15 Potential to Achieve Goals: Good    Frequency Min 3X/week   Barriers to discharge        Co-evaluation                End of Session Equipment Utilized During Treatment: Gait belt Activity Tolerance: Patient tolerated treatment well Patient left: in chair;with call bell/phone within reach;Other (comment) (resuming lunch) Nurse Communication: Mobility status (and ok for dc home)         Time: 6962-9528 PT Time Calculation (min) (ACUTE ONLY): 19 min   Charges:   PT Evaluation $Initial PT Evaluation Tier I: 1 Procedure     PT G CodesVan Clines Hamff 12/28/2014, 3:56 PM  Van Clines, PT  Acute Rehabilitation Services Pager [phone] Office [phone]

## 2014-12-28 NOTE — Progress Notes (Signed)
[facility] PROGRESS NOTE  Samantha [name] UJW:[id number] DOB: 1945-10-04 DOA: 12/25/2014 PCP: Dorrene German, MD  Assessment/Plan: 1. Ecoli bacteremia -cultures from Dialysis center, both sets positive -source likely AVG related, Duplex US unremarkable per Dr.Chen -CT abd pelvis unremarkable -repeat Blood Cx NGTD -CT LUE without fluid collection or inflammation c/w infection -VVS following, recommended Abx Rx to try to salvage this graft -appreciate ID input, await recs regarding Abx duration etc -Continue IV fortaz  2. 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 ESRD with AVG pseudoaneurysm -HD per renal  5. Anemia of chronic disease -stable  6. Hypertension -BP fluctuates, on lower side now, but not symptomatic -monitor, norvasc and metoprolol on hold  7. 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 deficits too -TSH profoundly low, T4 low normal, started Synthroid  DVT proph: SCDs due to low plts  Ambulate, PT eval  Code Status: Full Code Family Communication: son in law at bedside Disposition Plan: Home later today or tomorrow   Consultants:  Renal  VVS   HPI/Subjective: Feels ok, very hard of hearing  Objective: Filed Vitals:   12/28/14 0440  BP: 79/34  Pulse: 86  Temp: 97.7 F (36.5 C)  Resp: 16    Intake/Output Summary (Last 24 hours) at 12/28/14 0935 Last data filed at 12/28/14 0600  Gross per 24 hour  Intake    350 ml  Output      0 ml  Net    350 ml   Filed Weights   12/26/14 1448 12/26/14 1918 12/27/14 2117  Weight: 87.8 kg (193 lb 9 oz) 86.6 kg (190 lb 14.7 oz) 86.138 kg (189 lb 14.4 oz)    Exam:   General:  AAOx2, very hard of hearing, cognitive deficits  Cardiovascular:  S1S2/RRR  Respiratory: CTAB  Abdomen: soft, NT, BS present  Musculoskeletal: no edema c/c  L arm AVG   Data Reviewed: Basic Metabolic Panel:  Recent Labs Lab 12/25/14 2005 12/26/14 0513 12/27/14 0530 12/28/14 0419  NA 132* 135 129* 126*  K 5.1 4.6 4.4 4.1  CL 97* 100* 94* 89*  CO2 22 18* 26 23  GLUCOSE 73 71 73 70  BUN 47* 51* 20 32*  CREATININE 8.72* 9.52* 5.71* 7.65*  CALCIUM 7.2* 6.6* 6.6* 6.7*  MG  --  2.0  --   --   PHOS  --  6.6*  --   --    Liver Function Tests:  Recent Labs Lab 12/25/14 2318 12/26/14 0513 12/27/14 0530  AST 72* 52* 64*  ALT 11* 6* 6*  ALKPHOS 156* 114 95  BILITOT 1.0 1.2 1.3*  PROT 7.4 6.7 5.7*  ALBUMIN 3.0* 2.5* 2.1*   No results for input(s): LIPASE, AMYLASE in the last 168 hours.  Recent Labs Lab 12/25/14 2318 12/26/14 0121  AMMONIA 47* 99*   CBC:  Recent Labs Lab 12/25/14 2005 12/26/14 0513 12/27/14 0530 12/28/14 0419  WBC 2.5* 2.2* 2.1* 2.5*  NEUTROABS 1.0*  --   --   --   HGB 10.8* 9.6* 8.9* 9.6*  HCT 30.6* 28.1* 26.1* 28.1*  MCV 97.1 98.9 98.9 97.2  PLT 71* 60* 46* 48*   Cardiac Enzymes: No results for input(s): CKTOTAL, CKMB, CKMBINDEX, TROPONINI in the last 168 hours. BNP (last 3  results) No results for input(s): BNP in the last 8760 hours.  ProBNP (last 3 results) No results for input(s): PROBNP in the last 8760 hours.  CBG:  Recent Labs Lab 12/26/14 0044 12/26/14 0136 12/26/14 2012 12/26/14 2314  GLUCAP 53* 92 66 75    Recent Results (from the past 240 hour(s))  Culture, blood (routine x 2)     Status: None (Preliminary result)   Collection Time: 12/25/14  8:22 PM  Result Value Ref Range Status   Specimen Description BLOOD RIGHT ARM  Final   Special Requests BOTTLES DRAWN AEROBIC AND ANAEROBIC  Final   Culture NO GROWTH 2 DAYS  Final   Report Status PENDING  Incomplete  Culture, blood (routine x 2)     Status: None (Preliminary result)   Collection Time: 12/26/14  5:06 AM  Result  Value Ref Range Status   Specimen Description BLOOD RIGHT HAND  Final   Special Requests IN PEDIATRIC BOTTLE 4CC  Final   Culture NO GROWTH 1 DAY  Final   Report Status PENDING  Incomplete  Culture, blood (routine x 2)     Status: None (Preliminary result)   Collection Time: 12/26/14  3:05 PM  Result Value Ref Range Status   Specimen Description BLOOD LEFT ARM HEMODIALYSIS GRAFT  Final   Special Requests BOTTLES DRAWN AEROBIC AND ANAEROBIC 10CC  Final   Culture NO GROWTH < 24 HOURS  Final   Report Status PENDING  Incomplete  Culture, blood (routine x 2)     Status: None (Preliminary result)   Collection Time: 12/26/14  3:20 PM  Result Value Ref Range Status   Specimen Description BLOOD LEFT ARM HEMODIALYSIS GRAFT  Final   Special Requests BOTTLES DRAWN AEROBIC AND ANAEROBIC 10CC  Final   Culture NO GROWTH < 24 HOURS  Final   Report Status PENDING  Incomplete  MRSA PCR Screening     Status: None   Collection Time: 12/27/14  1:44 AM  Result Value Ref Range Status   MRSA by PCR NEGATIVE NEGATIVE Final    Comment:        The GeneXpert MRSA Assay (FDA approved for NASAL specimens only), is one component of a comprehensive MRSA colonization surveillance program. It is not intended to diagnose MRSA infection nor to guide or monitor treatment for MRSA infections.      Studies: Ct Abdomen Pelvis W Contrast  12/27/2014   CLINICAL DATA:  Diffuse abdominal pain  EXAM: CT ABDOMEN AND PELVIS WITH CONTRAST  TECHNIQUE: Multidetector CT imaging of the abdomen and pelvis was performed using the standard protocol following bolus administration of intravenous contrast.  CONTRAST:  OMNIPAQUE IOHEXOL 300 MG/ML  SOLN  COMPARISON:  08/03/2014  FINDINGS: Lung bases: Imaging at the lung bases is mildly degraded due to motion. The lungs are grossly clear in their visualized aspects. Mild cardiomegaly with trace pericardial fluid.  Liver is nodular in contour with central minimal intrahepatic ductal  prominence but no focal mass. Stable degree of mid common duct prominence measuring 1 cm in the setting of previous cholecystectomy, with tapering to the ampulla. Trace perihepatic ascites.  The spleen is at upper limits of normal in size but without focal abnormality. Splenorenal collaterals and esophageal varices are again noted.  Pancreas is unremarkable.  Adrenal glands are normal. Bilateral renal atrophy without hydronephrosis. Imaging of renal parenchyma degraded by motion. 1.1 cm left mid renal cortical cyst.  Stomach distended by ingested material. No bowel wall thickening or focal  segmental dilatation is identified. Normal appendix. Large amount of stool within the rectal vault.  Uterus and ovaries are unremarkable.  Moderate atheromatous aortic calcification without aneurysm. No lymphadenopathy.  Soft tissue anasarca is noted.  No free air.  Marrow is extremely inhomogeneous with central depression deformities and disc degenerative change noted predominantly at L3-L4. This appearance is not significantly changed.  IMPRESSION: No acute intra-abdominal or pelvic pathology.  Chronic stable findings as above.   Electronically Signed   By: Christiana Pellant M.D.   On: 12/27/2014 07:18   Ct Extrem Up Entire Arm L Wo/cm  12/27/2014   CLINICAL DATA:  Infected LEFT arm graft.  Mid forearm swelling.  EXAM: CT OF THE UPPER LEFT ARM WITHOUT CONTRAST  TECHNIQUE: Multidetector CT imaging was performed according to the standard protocol. Multiplanar CT image reconstructions were also generated.  COMPARISON:  Radiographs 09/22/2011.  FINDINGS: Dialysis fistula is present in the LEFT upper extremity. This appears to be a calcified prosthetic graft in the forearm. The vessels in the upper arm are tortuous. The graft demonstrates areas of aneurysmal dilation. There is no soft tissue abscess adjacent to the graft. No convincing evidence of cellulitis by CT. Soft tissue edema is present along the LEFT flank and LEFT chest  wall. Reverse LEFT shoulder arthroplasty is present. The radius and ulna appear within normal limits. Grossly, the hand appears within normal limits.  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   Electronically Signed   By: Andreas Newport M.D.   On: 12/27/2014 12:25    Scheduled Meds: . calcium acetate  1,334 mg Oral TID WC  . cefTAZidime (FORTAZ)  IV  2 g Intravenous Q M,W,F-HD  . levothyroxine  50 mcg Oral QAC breakfast  . multivitamin  1 tablet Oral QHS  . pantoprazole  20 mg Oral Daily  . sodium chloride  3 mL Intravenous Q12H  . sodium chloride  3 mL Intravenous Q12H   Continuous Infusions:  Antibiotics Given (last 72 hours)    Date/Time Action Medication Dose Rate   12/26/14 0409 Given   piperacillin-tazobactam (ZOSYN) IVPB 2.25 g 2.25 g 100 mL/hr   12/26/14 1830 Given   vancomycin (VANCOCIN) 750 mg in sodium chloride 0.9 % 150 mL IVPB 750 mg 150 mL/hr   12/26/14 2108 Given   piperacillin-tazobactam (ZOSYN) IVPB 2.25 g 2.25 g 100 mL/hr   12/27/14 0601 Given   piperacillin-tazobactam (ZOSYN) IVPB 2.25 g 2.25 g 100 mL/hr   12/27/14 1309 Given   piperacillin-tazobactam (ZOSYN) IVPB 2.25 g 2.25 g 100 mL/hr      Active Problems:   HTN (hypertension)   Anemia in chronic kidney disease (CKD)   Hypotension   End stage renal disease   Thrombocytopenia   Cardiomegaly   Bacteremia   Cirrhosis of liver    Time spent:    [facility]  [facility] Pager [phone]. If 7PM-7AM, please contact night-coverage at [email or web address], password [facility] 12/28/2014, 9:35 AM  LOS: 3 days

## 2014-12-28 NOTE — Progress Notes (Signed)
PT Cancellation Note  Patient Details Name: Samantha [name] MRN: [id number] DOB: 08/26/1945   Cancelled Treatment:    Reason Eval/Treat Not Completed: Patient at procedure or test/unavailable   Currently in HD;  Will follow up later today as time allows;  Otherwise, will follow up for PT tomorrow;   Thank you,  Van Clines, PT  Acute Rehabilitation Services Pager [phone] Office [phone]     Van Clines [facility] 12/28/2014, 1:52 PM

## 2014-12-30 LAB — CULTURE, BLOOD (ROUTINE X 2): CULTURE: NO GROWTH

## 2014-12-31 LAB — CULTURE, BLOOD (ROUTINE X 2)
CULTURE: NO GROWTH
Culture: NO GROWTH
Culture: NO GROWTH

## 2015-01-03 NOTE — Discharge Summary (Signed)
Physician Discharge Summary  Samantha [name]:[id number] DOB: [birth year] DOA: 12/25/2014  PCP: Dorrene German, MD  Admit date: 12/25/2014 Discharge date: 12/28/2014  Time spent: 45 minutes  Recommendations for Outpatient Follow-up:  1. Needs 21 days of IV Fortaz with Hemodialysis, close monitoring of AVG 2. Repeat Blood Cx in 1 week after Abx completed 3. Started on Synthroid, please monitor TSH in 6-8weeks 4. Please FU Hep B DNA  Discharge Diagnoses:    E Coli Bacteremia   HTN (hypertension)   Anemia in chronic kidney disease (CKD)   Hypotension   End stage renal disease   Thrombocytopenia   Cardiomegaly   Bacteremia   Cirrhosis of liver   Arteriovenous graft infection   Gram-negative bacteremia   Discharge Condition: Stable  Diet recommendation: Renal   Filed Weights   12/27/14 2117 12/28/14 0948 12/28/14 1354  Weight: 86.138 kg (189 lb 14.4 oz) 88.7 kg (195 lb 8.8 oz) 86.9 kg (191 lb 9.3 oz)    History of present illness:  C.C': sent by Kidney doctor Samantha [name] is a 69 y.o. female who presented to the ED  for evaluation of blister on AVG and + blood cultres. [facility] did not receive treatment [facility]9. Blood cultures drawn 9/7 were positive for Gram negative bacilli so she was admitted for IV antibiotics  Hospital Course:  1. Ecoli bacteremia -Blood cultures from Dialysis center, both sets positive -source likely AVG related, seen by [facility] Dr.Chen, had Duplex US of AVG: unremarkable per Dr.Chen -CT abd pelvis unremarkable -repeat Blood Cx NGTD -CT LUE without fluid collection or inflammation c/w infection -VVS following, recommended Abx Rx to try to salvage this graft -Seen by ID in consultation, recommended 21days of FOrtaz with HD and repeat Blood Cx in 1 week after Abx completed and monitoring of AVG  2. 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 ESRD with AVG pseudoaneurysm -HD per renal  5. Anemia of chronic disease -stable  6. Hypertension -BP fluctuates, was on lower side now, but not symptomatic -monitor, metoprolol on hold, then resumed  7. 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 deficits too -TSH profoundly low, T4 low normal, started Synthroid   Consultations:  Renal   ID  VVS  Discharge Exam: Filed Vitals:   12/28/14 1723  BP: 135/72  Pulse: 86  Temp: 98.2 F (36.8 C)  Resp: 1    General: AAOx2, very hard of hearing Cardiovascular: S1S2/RRR Respiratory: CTAB  Discharge Instructions   Discharge Instructions    Discharge instructions    Complete by:  As directed   Renal Diet     Increase activity slowly    Complete by:  As directed           Discharge Medication List as of 12/29/2014  8:04 AM    START taking these medications   Details  cefTAZidime 2 g in dextrose 5 % 50 mL Inject 2 g into the vein every Monday, Wednesday, and Friday with hemodialysis., Starting 12/28/2014, Until Discontinued, No Print    levothyroxine (SYNTHROID, LEVOTHROID) 50 MCG tablet Take 1 tablet (50 mcg total) by mouth daily before breakfast., Starting 12/28/2014, Until Discontinued, Print  CONTINUE these medications which have NOT CHANGED   Details  acetaminophen (TYLENOL) 500 MG tablet Take 500 mg by mouth every 6 (six) hours as needed for moderate pain., Until Discontinued, Historical Med    cinacalcet (SENSIPAR) 30 MG tablet Take 30 mg by mouth daily with supper., Until Discontinued, Historical Med    metoprolol tartrate (LOPRESSOR) 25 MG tablet Take 25 mg by mouth 2 (two) times daily., Until Discontinued, Historical Med    pantoprazole (PROTONIX) 40 MG tablet Take 40 mg by mouth daily., Starting 05/02/2014,  Until Discontinued, Historical Med      STOP taking these medications     naproxen sodium (ANAPROX) 220 MG tablet        Allergies  Allergen Reactions  . Peanut Butter Flavor   . Tomato   . Banana Other (See Comments)    Due to dialysis  . Chocolate Other (See Comments)    Due to dialysis  . Fish-Derived Products Other (See Comments)    Due to gout   Follow-up Information    Follow up with AVBUERE,EDWIN A, MD. Schedule an appointment as soon as possible for a visit in 1 week.   Specialty:  Internal Medicine   Contact information:   [address] [place] [address] [phone]        The results of significant diagnostics from this hospitalization (including imaging, microbiology, ancillary and laboratory) are listed below for reference.    Significant Diagnostic Studies: Dg Chest 2 View  12/25/2014   CLINICAL DATA:  Patient from dialysis with infected shunt. Renal failure. Cough.  EXAM: CHEST  2 VIEW  COMPARISON:  None.  FINDINGS: Enlarged cardiac silhouette. BILATERAL diffuse pulmonary opacities suggestive of edema. No effusion or pneumothorax. Osteopenia. BILATERAL shoulder replacements.  IMPRESSION: Cardiomegaly with developing pulmonary edema.   Electronically Signed   By: Elsie Stain M.D.   On: 12/25/2014 19:12   Ct Abdomen Pelvis W Contrast  12/27/2014   CLINICAL DATA:  Diffuse abdominal pain  EXAM: CT ABDOMEN AND PELVIS WITH CONTRAST  TECHNIQUE: Multidetector CT imaging of the abdomen and pelvis was performed using the standard protocol following bolus administration of intravenous contrast.  CONTRAST:  OMNIPAQUE IOHEXOL 300 MG/ML  SOLN  COMPARISON:  08/03/2014  FINDINGS: Lung bases: Imaging at the lung bases is mildly degraded due to motion. The lungs are grossly clear in their visualized aspects. Mild cardiomegaly with trace pericardial fluid.  Liver is nodular in contour with central minimal intrahepatic ductal prominence but no focal mass. Stable  degree of mid common duct prominence measuring 1 cm in the setting of previous cholecystectomy, with tapering to the ampulla. Trace perihepatic ascites.  The spleen is at upper limits of normal in size but without focal abnormality. Splenorenal collaterals and esophageal varices are again noted.  Pancreas is unremarkable.  Adrenal glands are normal. Bilateral renal atrophy without hydronephrosis. Imaging of renal parenchyma degraded by motion. 1.1 cm left mid renal cortical cyst.  Stomach distended by ingested material. No bowel wall thickening or focal segmental dilatation is identified. Normal appendix. Large amount of stool within the rectal vault.  Uterus and ovaries are unremarkable.  Moderate atheromatous aortic calcification without aneurysm. No lymphadenopathy.  Soft tissue anasarca is noted.  No free air.  Marrow is extremely inhomogeneous with central depression deformities and disc degenerative change noted predominantly at L3-L4. This appearance is not significantly changed.  IMPRESSION: No acute intra-abdominal or pelvic pathology.  Chronic stable findings as above.   Electronically Signed  By: Christiana Pellant M.D.   On: 12/27/2014 07:18   Ct Extrem Up Entire Arm L Wo/cm  12/27/2014   CLINICAL DATA:  Infected LEFT arm graft.  Mid forearm swelling.  EXAM: CT OF THE UPPER LEFT ARM WITHOUT CONTRAST  TECHNIQUE: Multidetector CT imaging was performed according to the standard protocol. Multiplanar CT image reconstructions were also generated.  COMPARISON:  Radiographs 09/22/2011.  FINDINGS: Dialysis fistula is present in the LEFT upper extremity. This appears to be a calcified prosthetic graft in the forearm. The vessels in the upper arm are tortuous. The graft demonstrates areas of aneurysmal dilation. There is no soft tissue abscess adjacent to the graft. No convincing evidence of cellulitis by CT. Soft tissue edema is present along the LEFT flank and LEFT chest wall. Reverse LEFT shoulder arthroplasty  is present. The radius and ulna appear within normal limits. Grossly, the hand appears within normal limits.  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   Electronically Signed   By: Andreas Newport M.D.   On: 12/27/2014 12:25    Microbiology: Recent Results (from the past 240 hour(s))  Culture, blood (routine x 2)     Status: None   Collection Time: 12/25/14  8:22 PM  Result Value Ref Range Status   Specimen Description BLOOD RIGHT ARM  Final   Special Requests BOTTLES DRAWN AEROBIC AND ANAEROBIC  Final   Culture NO GROWTH 5 DAYS  Final   Report Status 12/30/2014 FINAL  Final  Culture, blood (routine x 2)     Status: None   Collection Time: 12/26/14  5:06 AM  Result Value Ref Range Status   Specimen Description BLOOD RIGHT HAND  Final   Special Requests IN PEDIATRIC BOTTLE 4CC  Final   Culture NO GROWTH 5 DAYS  Final   Report Status 12/31/2014 FINAL  Final  Culture, blood (routine x 2)     Status: None   Collection Time: 12/26/14  3:05 PM  Result Value Ref Range Status   Specimen Description BLOOD LEFT ARM HEMODIALYSIS GRAFT  Final   Special Requests BOTTLES DRAWN AEROBIC AND ANAEROBIC 10CC  Final   Culture NO GROWTH 5 DAYS  Final   Report Status 12/31/2014 FINAL  Final  Culture, blood (routine x 2)     Status: None   Collection Time: 12/26/14  3:20 PM  Result Value Ref Range Status   Specimen Description BLOOD LEFT ARM HEMODIALYSIS GRAFT  Final   Special Requests BOTTLES DRAWN AEROBIC AND ANAEROBIC 10CC  Final   Culture NO GROWTH 5 DAYS  Final   Report Status 12/31/2014 FINAL  Final  MRSA PCR Screening     Status: None   Collection Time: 12/27/14  1:44 AM  Result Value Ref Range Status   MRSA by PCR NEGATIVE NEGATIVE Final    Comment:        The GeneXpert MRSA Assay (FDA approved for NASAL specimens only), is one component of a comprehensive MRSA colonization surveillance program. It is not intended to  diagnose MRSA infection nor to guide or monitor treatment for MRSA infections.      Labs: Basic Metabolic Panel:  Recent Labs Lab 12/28/14 0419  NA 126*  K 4.1  CL 89*  CO2 23  GLUCOSE 70  BUN 32*  CREATININE 7.65*  CALCIUM 6.7*   Liver Function Tests: No results for input(s): AST, ALT, ALKPHOS, BILITOT, PROT, ALBUMIN in the last 168 hours. No  results for input(s): LIPASE, AMYLASE in the last 168 hours. No results for input(s): AMMONIA in the last 168 hours. CBC:  Recent Labs Lab 12/28/14 0419  WBC 2.5*  HGB 9.6*  HCT 28.1*  MCV 97.2  PLT 48*   Cardiac Enzymes: No results for input(s): CKTOTAL, CKMB, CKMBINDEX, TROPONINI in the last 168 hours. BNP: BNP (last 3 results) No results for input(s): BNP in the last 8760 hours.  ProBNP (last 3 results) No results for input(s): PROBNP in the last 8760 hours.  CBG: No results for input(s): GLUCAP in the last 168 hours.     SignedZannie Cove  [facility] 01/03/2015, 10:44 PM

## 2015-01-05 LAB — NGI HBV SUPERQUANT

## 2015-01-06 ENCOUNTER — Encounter: Payer: Self-pay | Admitting: Vascular Surgery

## 2015-01-08 ENCOUNTER — Encounter: Payer: Self-pay | Admitting: *Deleted

## 2015-01-08 ENCOUNTER — Other Ambulatory Visit: Payer: Self-pay | Admitting: *Deleted

## 2015-01-08 ENCOUNTER — Ambulatory Visit (INDEPENDENT_AMBULATORY_CARE_PROVIDER_SITE_OTHER): Payer: Medicare Other | Admitting: Vascular Surgery

## 2015-01-08 ENCOUNTER — Encounter: Payer: Self-pay | Admitting: Vascular Surgery

## 2015-01-08 VITALS — BP 114/75 | HR 77 | Temp 98.4°F | Ht <= 58 in | Wt 179.7 lb

## 2015-01-08 DIAGNOSIS — T82511A Breakdown (mechanical) of surgically created arteriovenous shunt, initial encounter: Secondary | ICD-10-CM | POA: Insufficient documentation

## 2015-01-08 DIAGNOSIS — T82898A Other specified complication of vascular prosthetic devices, implants and grafts, initial encounter: Secondary | ICD-10-CM | POA: Insufficient documentation

## 2015-01-08 DIAGNOSIS — T82898D Other specified complication of vascular prosthetic devices, implants and grafts, subsequent encounter: Secondary | ICD-10-CM

## 2015-01-08 DIAGNOSIS — T82511D Breakdown (mechanical) of surgically created arteriovenous shunt, subsequent encounter: Secondary | ICD-10-CM

## 2015-01-08 NOTE — Progress Notes (Signed)
  Established Dialysis Access  History of Present Illness  Samantha [name] is a 68 y.o. (12/10/1945) female who presents for re-evaluation of left forearm AVG.  Patient was recently hospitalized and evaluated for possible left FA AVG infection.  Work-up at that time was negative.  CT and access duplex demonstrated only PSA without frank fluid or abscess around graft.  The patient returns with pain overlying medial arm of graft and scab overlying this arm of the graft  Past Medical History  Diagnosis Date  . Shoulder pain   . Headache(784.0)     occasionally  . Dizziness   . Occasional numbness/prickling/tingling of fingers and toes   . Arthritis     left shoulder  . Joint pain   . Joint swelling   . Gastric ulcer   . Dry skin   . Peripheral edema   . Constipation   . Oligouria   . H/O: GI bleed   . Hepatitis     Hep B  . History of kidney stones   . History of blood transfusion   . Hypertension   . Anemia   . Hypothyroidism     takes Synthroid daily  . Impaired hearing   . Diabetes mellitus     borderline  . Renal disorder     m, w, F     Past Surgical History  Procedure Laterality Date  . Shoulder surgery  3-4yrs ago    right replacement  . Esophagogastroduodenoscopy  06/09/2011    Procedure: ESOPHAGOGASTRODUODENOSCOPY (EGD);  Surgeon: Patrick D Hung, MD;  Location: [facility];  Service: Endoscopy;  Laterality: N/A;  . Cataract surgery      bilateral  . Reverse shoulder arthroplasty  09/22/2011    Procedure: REVERSE SHOULDER ARTHROPLASTY;  Surgeon: Steven R Norris, MD;  Location: [facility] OR;  Service: Orthopedics;  Laterality: Left;  left reverse shoulder arthroplasty  . Cholecystectomy  12/26/2011  . Cholecystectomy  12/26/2011    Procedure: LAPAROSCOPIC CHOLECYSTECTOMY;  Surgeon: Eric M Wilson, MD,FACS;  Location: [facility] OR;  Service: General;  Laterality: N/A;  . Arteriovenous graft placement Left     forearm  . Shuntogram Left 08/29/2012    Procedure: SHUNTOGRAM;   Surgeon: Brian L Chen, MD;  Location: [facility] CATH LAB;  Service: Cardiovascular;  Laterality: Left;  arm    Social History   Social History  . Marital Status: Widowed    Spouse Name: N/A  . Number of Children: N/A  . Years of Education: N/A   Occupational History  . Not on file.   Social History Main Topics  . Smoking status: Never Smoker   . Smokeless tobacco: Current User    Types: Chew  . Alcohol Use: No     Comment: quit 4-5yrs ago  . Drug Use: No  . Sexual Activity: No   Other Topics Concern  . Not on file   Social History Narrative    Family History  Problem Relation Age of Onset  . Hypertension Mother   . Diabetes Mother   . Cancer Brother     Current Outpatient Prescriptions  Medication Sig Dispense Refill  . acetaminophen (TYLENOL) 500 MG tablet Take 500 mg by mouth every 6 (six) hours as needed for moderate pain.    . cefTAZidime 2 g in dextrose 5 % 50 mL Inject 2 g into the vein every Monday, Wednesday, and Friday with hemodialysis.    . cinacalcet (SENSIPAR) 30 MG tablet Take 30 mg by mouth daily with   supper.    . levothyroxine (SYNTHROID, LEVOTHROID) 50 MCG tablet Take 1 tablet (50 mcg total) by mouth daily before breakfast. 30 tablet 0  . metoprolol tartrate (LOPRESSOR) 25 MG tablet Take 25 mg by mouth 2 (two) times daily.    . pantoprazole (PROTONIX) 40 MG tablet Take 40 mg by mouth daily.  5  . HYDROcodone-acetaminophen (NORCO/VICODIN) 5-325 MG per tablet      No current facility-administered medications for this visit.     Allergies  Allergen Reactions  . Peanut Butter Flavor   . Tomato   . Banana Other (See Comments)    Due to dialysis  . Chocolate Other (See Comments)    Due to dialysis  . Fish-Derived Products Other (See Comments)    Due to gout     REVIEW OF SYSTEMS:  (Positives checked otherwise negative)  CARDIOVASCULAR:   [ ] chest pain,  [ ] chest pressure,  [ ] palpitations,  [ ] shortness of breath when laying flat,  [ ]  shortness of breath with exertion,   [ ] pain in feet when walking,  [ ] pain in feet when laying flat, [ ] history of blood clot in veins (DVT),  [ ] history of phlebitis,  [ ] swelling in legs,  [ ] varicose veins  PULMONARY:   [ ] productive cough,  [ ] asthma,  [ ] wheezing  NEUROLOGIC:   [ ] weakness in arms or legs,  [ ] numbness in arms or legs,  [ ] difficulty speaking or slurred speech,  [ ] temporary loss of vision in one eye,  [ ] dizziness  HEMATOLOGIC:   [ ] bleeding problems,  [ ] problems with blood clotting too easily  MUSCULOSKEL:   [ ] joint pain, [ ] joint swelling  GASTROINTEST:   [ ] vomiting blood,  [ ] blood in stool     GENITOURINARY:   [ ] burning with urination,  [ ] blood in urine [x] ESRD-HD: M-W-F  PSYCHIATRIC:   [ ] history of major depression  INTEGUMENTARY:   [ ] rashes,  [ ] ulcers  CONSTITUTIONAL:   [ ] fever,  [ ] chills    Physical Examination  Filed Vitals:   01/08/15 0910  BP: 114/75  Pulse: 77  Temp: 98.4 F (36.9 C)  TempSrc: Oral  Height: 4' 9" (1.448 m)  Weight: 179 lb 11.2 oz (81.511 kg)  SpO2: 100%   Body mass index is 38.88 kg/(m^2).  General: A&O x 3, WD, WN  Pulmonary: Sym exp, good air movt, CTAB, no rales, rhonchi, & wheezing  Cardiac: RRR, Nl S1, S2, no Murmurs, rubs or gallops  Vascular: Vessel Right Left  Radial Palpable Not Palpable  Ulnar Faintly Palpable Not Palpable  Brachial Palpable Palpable   Gastrointestinal: soft, NTND, no G/R, bo HSM, no masses, no CVAT B  Musculoskeletal: M/S 5/5 throughout , Extremities without  ischemic changes , palpable thrill in access in L FA AVG, bruit in access, scab overlying large pseudoaneurysm in medial arm of graft  Neurologic: Pain and light touch intact in extremities , Motor exam as listed above   Medical Decision Making  Samantha [name] is a 68 y.o. female who presents with ESRD requiring hemodialysis, left forearm AVG pseudoaneurysm at  risk for bleeding   I recommend: revision of the L FA AVG with interposition graft to avoid acute hemorrhage.  Depending on findings intraoperatively, I may have to remove the AVG   and place a TDC.  I had an extensive discussion with this patient in regards to the nature of access surgery, including risk, benefits, and alternatives.    The patient is aware that the risks of access surgery include but are not limited to: bleeding, infection, steal syndrome, nerve damage, ischemic monomelic neuropathy, failure of access to mature, and possible need for additional access procedures in the future.  The patient has agreed to proceed with the above procedure which will be scheduled 27 SEP 16.   Brian Chen, MD [facility] Office: [phone] Pager: [phone]  01/08/2015, 9:28 AM   

## 2015-01-11 ENCOUNTER — Encounter (HOSPITAL_COMMUNITY): Payer: Self-pay | Admitting: *Deleted

## 2015-01-11 MED ORDER — CHLORHEXIDINE GLUCONATE CLOTH 2 % EX PADS: 6.0000 | MEDICATED_PAD | Freq: Once | CUTANEOUS | Status: DC

## 2015-01-11 MED ORDER — SODIUM CHLORIDE 0.9 % IV SOLN
INTRAVENOUS | Status: DC
  Administered 2015-01-12 (×2): via INTRAVENOUS

## 2015-01-11 MED ORDER — DEXTROSE 5 % IV SOLN
1.5000 g | INTRAVENOUS | Status: AC
  Administered 2015-01-12: 1.5 g via INTRAVENOUS
  Filled 2015-01-11: qty 1.5

## 2015-01-11 NOTE — Progress Notes (Signed)
Pt denies SOB, chest pain, and being under the care of a cardiologist. Pt SDW-pre -op call completed by both pt and pt daughter Pattricia Boss, because pt is hearing impaired. Pt made aware to stop otc vitamins, NSAID's and herbal medications. Pt daughter verbalized understanding of all pre-op instructions. Rica Mast, NP, anesthesia asked to review pt history, EKG and chest x ray.

## 2015-01-11 NOTE — Progress Notes (Signed)
Anesthesia Chart Review:  Pt is 69 year old female scheduled for revision of L forearm AV goretex graft on 01/12/2015 with Dr. Imogene Burn.   Pt is a same day work up.   PMH includes: HTN, borderline DM, hepatitis B (not evidenced by Hep B DNA testing 12/28/14), ESRD on HD, hypothyroidism. Hard of hearing. Never smoker. BMI 39. S/p laparoscopic cholecystectomy 12/26/11. S/p L reverse shoulder arthroplasty 09/22/11.   Pt hospitalized 9/9-9/12/16 for E coli bacteremia complicated by liver cirrhosis, AV graft infection, thrombocytopenia, cardiomegaly.   Medications include: ceftazidime, levothyroxine, metoprolol, protonix.   Pt will need labs DOS. Reviewing pt's most recent labs from 12/28/14, pt was hyponatremic (Na 126, Cl 89), hypocalcemic (Ca 6.7), pancytopenic (WBC 2.5, H/H 9.6/28.1, platelets 48; these results consistent with prior results dating back to 2013, although platelets seem slightly lower than usual baseline of 60's-90's).   Chest x-ray 12/25/2014 reviewed. Cardiomegaly with developing pulmonary edema.   EKG 08/03/2014: Sinus rhythm. Borderline low voltage, extremity leads. Prolonged QT interval.  Echo 12/27/2014:  - Left ventricle: The cavity size was normal. Systolic function wasvigorous. The estimated ejection fraction was in the range of 65%to 70%. Wall motion was normal; there were no regional wallmotion abnormalities. Features are consistent with a pseudonormalleft ventricular filling pattern, with concomitant abnormalrelaxation and increased filling pressure (grade 2 diastolicdysfunction). - Mitral valve: Moderately calcified annulus. There was mild to moderate regurgitation. - Left atrium: The atrium was moderately dilated. - Right atrium: The atrium was mildly dilated. - Pulmonary arteries: Systolic pressure was mildly increased. PApeak pressure: 35 mm Hg (S).  Discussed case with Dr. Michelle Piper. Will get CBC, BMET DOS.   If labs are acceptable DOS, I anticipate pt can proceed as  scheduled.   Rica Mast, FNP-BC [facility]/Anesthesiology Phone: [phone] 01/11/2015 2:49 PM

## 2015-01-12 ENCOUNTER — Ambulatory Visit (HOSPITAL_COMMUNITY): Payer: Medicare Other | Admitting: Emergency Medicine

## 2015-01-12 ENCOUNTER — Ambulatory Visit (HOSPITAL_COMMUNITY): Payer: Medicare Other

## 2015-01-12 ENCOUNTER — Encounter (HOSPITAL_COMMUNITY)
Admission: RE | Disposition: A | Discharge status: Home / Self Care | Payer: Medicare Other | Source: Ambulatory Visit | Attending: Vascular Surgery

## 2015-01-12 ENCOUNTER — Ambulatory Visit (HOSPITAL_COMMUNITY)
Admission: RE | Admit: 2015-01-12 | Discharge: 2015-01-12 | Disposition: A | Discharge status: Home / Self Care | Payer: Medicare Other | Source: Ambulatory Visit | Attending: Vascular Surgery | Admitting: Vascular Surgery

## 2015-01-12 ENCOUNTER — Encounter (HOSPITAL_COMMUNITY): Payer: Self-pay | Admitting: *Deleted

## 2015-01-12 DIAGNOSIS — Y832 Surgical operation with anastomosis, bypass or graft as the cause of abnormal reaction of the patient, or of later complication, without mention of misadventure at the time of the procedure: Secondary | ICD-10-CM | POA: Diagnosis not present

## 2015-01-12 DIAGNOSIS — Z79891 Long term (current) use of opiate analgesic: Secondary | ICD-10-CM | POA: Insufficient documentation

## 2015-01-12 DIAGNOSIS — I120 Hypertensive chronic kidney disease with stage 5 chronic kidney disease or end stage renal disease: Secondary | ICD-10-CM | POA: Diagnosis not present

## 2015-01-12 DIAGNOSIS — Z95828 Presence of other vascular implants and grafts: Secondary | ICD-10-CM

## 2015-01-12 DIAGNOSIS — Z6839 Body mass index (BMI) 39.0-39.9, adult: Secondary | ICD-10-CM | POA: Insufficient documentation

## 2015-01-12 DIAGNOSIS — T82898A Other specified complication of vascular prosthetic devices, implants and grafts, initial encounter: Secondary | ICD-10-CM

## 2015-01-12 DIAGNOSIS — Z992 Dependence on renal dialysis: Secondary | ICD-10-CM | POA: Diagnosis not present

## 2015-01-12 DIAGNOSIS — E669 Obesity, unspecified: Secondary | ICD-10-CM | POA: Diagnosis not present

## 2015-01-12 DIAGNOSIS — E1122 Type 2 diabetes mellitus with diabetic chronic kidney disease: Secondary | ICD-10-CM | POA: Insufficient documentation

## 2015-01-12 DIAGNOSIS — E039 Hypothyroidism, unspecified: Secondary | ICD-10-CM | POA: Insufficient documentation

## 2015-01-12 DIAGNOSIS — N186 End stage renal disease: Secondary | ICD-10-CM | POA: Diagnosis not present

## 2015-01-12 DIAGNOSIS — Z792 Long term (current) use of antibiotics: Secondary | ICD-10-CM | POA: Diagnosis not present

## 2015-01-12 DIAGNOSIS — Z79899 Other long term (current) drug therapy: Secondary | ICD-10-CM | POA: Insufficient documentation

## 2015-01-12 DIAGNOSIS — Z419 Encounter for procedure for purposes other than remedying health state, unspecified: Secondary | ICD-10-CM

## 2015-01-12 HISTORY — PX: REVISION OF ARTERIOVENOUS GORETEX GRAFT: SHX6073

## 2015-01-12 HISTORY — PX: INSERTION OF DIALYSIS CATHETER: SHX1324

## 2015-01-12 LAB — GLUCOSE, CAPILLARY
Glucose-Capillary: 70 mg/dL (ref 65–99)
Glucose-Capillary: 88 mg/dL (ref 65–99)

## 2015-01-12 LAB — POCT I-STAT 4, (NA,K, GLUC, HGB,HCT)
Glucose, Bld: 82 mg/dL (ref 65–99)
HEMATOCRIT: 40 % (ref 36.0–46.0)
HEMOGLOBIN: 13.6 g/dL (ref 12.0–15.0)
POTASSIUM: 4.2 mmol/L (ref 3.5–5.1)
Sodium: 137 mmol/L (ref 135–145)

## 2015-01-12 SURGERY — REVISION OF ARTERIOVENOUS GORETEX GRAFT
Anesthesia: General | Site: Neck | Laterality: Right

## 2015-01-12 MED ORDER — 0.9 % SODIUM CHLORIDE (POUR BTL) OPTIME
TOPICAL | Status: DC | PRN
  Administered 2015-01-12: 1000 mL

## 2015-01-12 MED ORDER — LIDOCAINE HCL (CARDIAC) 20 MG/ML IV SOLN
INTRAVENOUS | Status: AC
  Filled 2015-01-12: qty 5

## 2015-01-12 MED ORDER — ROCURONIUM BROMIDE 50 MG/5ML IV SOLN
INTRAVENOUS | Status: AC
  Filled 2015-01-12: qty 1

## 2015-01-12 MED ORDER — THROMBIN 20000 UNITS EX SOLR
CUTANEOUS | Status: AC
  Filled 2015-01-12: qty 20000

## 2015-01-12 MED ORDER — SUCCINYLCHOLINE CHLORIDE 20 MG/ML IJ SOLN
INTRAMUSCULAR | Status: AC
  Filled 2015-01-12: qty 1

## 2015-01-12 MED ORDER — FENTANYL CITRATE (PF) 100 MCG/2ML IJ SOLN: 25.0000 ug | INTRAMUSCULAR | Status: DC | PRN

## 2015-01-12 MED ORDER — LIDOCAINE HCL (PF) 1 % IJ SOLN
INTRAMUSCULAR | Status: AC
  Filled 2015-01-12: qty 30

## 2015-01-12 MED ORDER — ONDANSETRON HCL 4 MG/2ML IJ SOLN
INTRAMUSCULAR | Status: AC
  Filled 2015-01-12: qty 2

## 2015-01-12 MED ORDER — OXYCODONE HCL 5 MG/5ML PO SOLN: 5.0000 mg | Freq: Once | ORAL | Status: AC | PRN

## 2015-01-12 MED ORDER — ONDANSETRON HCL 4 MG/2ML IJ SOLN
INTRAMUSCULAR | Status: DC | PRN
  Administered 2015-01-12: 4 mg via INTRAVENOUS

## 2015-01-12 MED ORDER — FENTANYL CITRATE (PF) 250 MCG/5ML IJ SOLN
INTRAMUSCULAR | Status: AC
  Filled 2015-01-12: qty 5

## 2015-01-12 MED ORDER — ONDANSETRON HCL 4 MG/2ML IJ SOLN: 4.0000 mg | Freq: Four times a day (QID) | INTRAMUSCULAR | Status: DC | PRN

## 2015-01-12 MED ORDER — HEPARIN SODIUM (PORCINE) 1000 UNIT/ML IJ SOLN
INTRAMUSCULAR | Status: DC | PRN
  Administered 2015-01-12: 1000 [IU]

## 2015-01-12 MED ORDER — GLYCOPYRROLATE 0.2 MG/ML IJ SOLN
INTRAMUSCULAR | Status: DC | PRN
  Administered 2015-01-12: 0.2 mg via INTRAVENOUS

## 2015-01-12 MED ORDER — SODIUM CHLORIDE 0.9 % IV SOLN
INTRAVENOUS | Status: DC | PRN
  Administered 2015-01-12: 500 mL

## 2015-01-12 MED ORDER — OXYCODONE HCL 5 MG PO TABS
5.0000 mg | ORAL_TABLET | Freq: Once | ORAL | Status: AC | PRN
  Administered 2015-01-12: 5 mg via ORAL

## 2015-01-12 MED ORDER — HEPARIN SODIUM (PORCINE) 1000 UNIT/ML IJ SOLN
INTRAMUSCULAR | Status: DC | PRN
  Administered 2015-01-12: 8000 [IU] via INTRAVENOUS

## 2015-01-12 MED ORDER — LIDOCAINE HCL (CARDIAC) 20 MG/ML IV SOLN
INTRAVENOUS | Status: DC | PRN
  Administered 2015-01-12: 80 mg via INTRAVENOUS

## 2015-01-12 MED ORDER — DEXTROSE 5 % IV SOLN
10.0000 mg | INTRAVENOUS | Status: DC | PRN
  Administered 2015-01-12: 50 ug/min via INTRAVENOUS

## 2015-01-12 MED ORDER — OXYCODONE HCL 5 MG PO TABS
ORAL_TABLET | ORAL | Status: AC
  Filled 2015-01-12: qty 1

## 2015-01-12 MED ORDER — PROTAMINE SULFATE 10 MG/ML IV SOLN
INTRAVENOUS | Status: DC | PRN
  Administered 2015-01-12 (×3): 10 mg via INTRAVENOUS
  Administered 2015-01-12: 20 mg via INTRAVENOUS

## 2015-01-12 MED ORDER — PROPOFOL 10 MG/ML IV BOLUS
INTRAVENOUS | Status: AC
  Filled 2015-01-12: qty 20

## 2015-01-12 MED ORDER — ESMOLOL HCL 10 MG/ML IV SOLN
INTRAVENOUS | Status: DC | PRN
  Administered 2015-01-12: 10 mg via INTRAVENOUS

## 2015-01-12 MED ORDER — LIDOCAINE HCL (PF) 1 % IJ SOLN
INTRAMUSCULAR | Status: DC | PRN
  Administered 2015-01-12: 30 mL

## 2015-01-12 MED ORDER — ESMOLOL HCL 10 MG/ML IV SOLN
INTRAVENOUS | Status: AC
  Filled 2015-01-12: qty 10

## 2015-01-12 MED ORDER — THROMBIN 20000 UNITS EX SOLR
CUTANEOUS | Status: DC | PRN
  Administered 2015-01-12 (×2): 20 mL via TOPICAL

## 2015-01-12 MED ORDER — PHENYLEPHRINE HCL 10 MG/ML IJ SOLN
INTRAMUSCULAR | Status: DC | PRN
  Administered 2015-01-12 (×5): 80 ug via INTRAVENOUS

## 2015-01-12 MED ORDER — HYDROCODONE-ACETAMINOPHEN 5-325 MG PO TABS: 1.0000 | ORAL_TABLET | Freq: Four times a day (QID) | ORAL | Status: DC | PRN

## 2015-01-12 MED ORDER — HEPARIN SODIUM (PORCINE) 1000 UNIT/ML IJ SOLN
INTRAMUSCULAR | Status: AC
  Filled 2015-01-12: qty 1

## 2015-01-12 MED ORDER — PROPOFOL 10 MG/ML IV BOLUS
INTRAVENOUS | Status: DC | PRN
  Administered 2015-01-12: 110 mg via INTRAVENOUS

## 2015-01-12 MED ORDER — GLYCOPYRROLATE 0.2 MG/ML IJ SOLN
INTRAMUSCULAR | Status: AC
  Filled 2015-01-12: qty 1

## 2015-01-12 SURGICAL SUPPLY — 56 items
BIOPATCH RED 1 DISK 7.0 (GAUZE/BANDAGES/DRESSINGS) ×3 IMPLANT
BIOPATCH RED 1IN DISK 7.0MM (GAUZE/BANDAGES/DRESSINGS) ×1
CANISTER SUCTION 2500CC (MISCELLANEOUS) ×4 IMPLANT
CATH CANNON HEMO 15FR 23CM (HEMODIALYSIS SUPPLIES) ×4 IMPLANT
CATH EMB 4FR 40CM (CATHETERS) ×4 IMPLANT
CLIP TI MEDIUM 6 (CLIP) ×4 IMPLANT
CLIP TI WIDE RED SMALL 6 (CLIP) ×4 IMPLANT
COVER PROBE W GEL 5X96 (DRAPES) ×4 IMPLANT
DECANTER SPIKE VIAL GLASS SM (MISCELLANEOUS) ×4 IMPLANT
DRAPE C-ARM 42X72 X-RAY (DRAPES) ×4 IMPLANT
DRAPE CHEST BREAST 15X10 FENES (DRAPES) ×4 IMPLANT
DRSG TEGADERM 2-3/8X2-3/4 SM (GAUZE/BANDAGES/DRESSINGS) ×4 IMPLANT
DRSG TELFA 3X8 NADH (GAUZE/BANDAGES/DRESSINGS) ×4 IMPLANT
ELECT REM PT RETURN 9FT ADLT (ELECTROSURGICAL) ×4
ELECTRODE REM PT RTRN 9FT ADLT (ELECTROSURGICAL) ×2 IMPLANT
GAUZE SPONGE 2X2 8PLY STRL LF (GAUZE/BANDAGES/DRESSINGS) ×2 IMPLANT
GAUZE SPONGE 4X4 16PLY XRAY LF (GAUZE/BANDAGES/DRESSINGS) ×8 IMPLANT
GLOVE BIO SURGEON STRL SZ 6.5 (GLOVE) ×6 IMPLANT
GLOVE BIO SURGEON STRL SZ7 (GLOVE) ×12 IMPLANT
GLOVE BIO SURGEON STRL SZ8 (GLOVE) ×4 IMPLANT
GLOVE BIO SURGEONS STRL SZ 6.5 (GLOVE) ×2
GLOVE BIOGEL PI IND STRL 6.5 (GLOVE) ×4 IMPLANT
GLOVE BIOGEL PI IND STRL 7.0 (GLOVE) ×2 IMPLANT
GLOVE BIOGEL PI IND STRL 7.5 (GLOVE) ×4 IMPLANT
GLOVE BIOGEL PI INDICATOR 6.5 (GLOVE) ×4
GLOVE BIOGEL PI INDICATOR 7.0 (GLOVE) ×2
GLOVE BIOGEL PI INDICATOR 7.5 (GLOVE) ×4
GLOVE ECLIPSE 6.5 STRL STRAW (GLOVE) ×8 IMPLANT
GLOVE SURG SS PI 7.0 STRL IVOR (GLOVE) ×4 IMPLANT
GOWN STRL REUS W/ TWL LRG LVL3 (GOWN DISPOSABLE) ×12 IMPLANT
GOWN STRL REUS W/TWL LRG LVL3 (GOWN DISPOSABLE) ×12
GRAFT GORETEX 6X40 (Vascular Products) ×4 IMPLANT
KIT BASIN OR (CUSTOM PROCEDURE TRAY) ×4 IMPLANT
KIT ROOM TURNOVER OR (KITS) ×4 IMPLANT
LIQUID BAND (GAUZE/BANDAGES/DRESSINGS) ×8 IMPLANT
NEEDLE 18GX1X1/2 (RX/OR ONLY) (NEEDLE) ×4 IMPLANT
NEEDLE HYPO 25GX1X1/2 BEV (NEEDLE) ×4 IMPLANT
NS IRRIG 1000ML POUR BTL (IV SOLUTION) ×4 IMPLANT
PACK CV ACCESS (CUSTOM PROCEDURE TRAY) ×4 IMPLANT
PAD ARMBOARD 7.5X6 YLW CONV (MISCELLANEOUS) ×8 IMPLANT
SET MICROPUNCTURE 5F STIFF (MISCELLANEOUS) ×4 IMPLANT
SPONGE GAUZE 2X2 STER 10/PKG (GAUZE/BANDAGES/DRESSINGS) ×2
SPONGE LAP 18X18 X RAY DECT (DISPOSABLE) ×4 IMPLANT
SPONGE SURGIFOAM ABS GEL 100 (HEMOSTASIS) ×8 IMPLANT
SUT ETHILON 3 0 PS 1 (SUTURE) ×4 IMPLANT
SUT GORETEX 5 0 TT13 24 (SUTURE) ×16 IMPLANT
SUT MNCRL AB 4-0 PS2 18 (SUTURE) ×12 IMPLANT
SUT PROLENE 6 0 BV (SUTURE) ×8 IMPLANT
SUT PROLENE 7 0 BV 1 (SUTURE) IMPLANT
SUT VIC AB 3-0 SH 27 (SUTURE) ×6
SUT VIC AB 3-0 SH 27X BRD (SUTURE) ×6 IMPLANT
SYRINGE 10CC LL (SYRINGE) ×4 IMPLANT
SYRINGE 3CC LL L/F (MISCELLANEOUS) ×4 IMPLANT
TAPE CLOTH SURG 4X10 WHT LF (GAUZE/BANDAGES/DRESSINGS) ×4 IMPLANT
UNDERPAD 30X30 INCONTINENT (UNDERPADS AND DIAPERS) ×4 IMPLANT
WATER STERILE IRR 1000ML POUR (IV SOLUTION) ×4 IMPLANT

## 2015-01-12 NOTE — Progress Notes (Signed)
Dr Chaney Malling in to see the patient, informed that lab was not able to obtain blood.

## 2015-01-12 NOTE — Op Note (Signed)
OPERATIVE NOTE  PROCEDURE: 1.  Revision of left forearm looped arteriovenous graft 2.  Right internal jugular vein tunneled dialysis catheter placement 3.  Right internal jugular vein cannulation under ultrasound guidance  PRE-OPERATIVE DIAGNOSIS: end-stage renal failure  POST-OPERATIVE DIAGNOSIS: same as above  SURGEON: Leonides Sake, MD  ANESTHESIA: general  ESTIMATED BLOOD LOSS: 30 cc  FINDING(S): 1.  Tips of the catheter in the right atrium on fluoroscopy 2.  No obvious pneumothorax on fluoroscopy  SPECIMEN(S):  none  INDICATIONS:   Samantha [name] is a 69 y.o. female who presents with attenuated skin overlying the medial arm of her left forearm loop arteriovenous graft.  I had concerns with a new ulcer overlying the large pseudoaneurysm here that she was a risk for imminent rupture.  I recommended: revision of the left forearm arteriovenous graft and possible tunneled dialysis catheter placement.  Risk, benefits, and alternatives to access surgery were discussed.  The patient is aware the risks include but are not limited to: bleeding, infection, steal syndrome, nerve damage, ischemic monomelic neuropathy, failure to mature, need for additional procedures, death and stroke.  The patient is aware the risks of tunneled dialysis catheter placement include but are not limited to: bleeding, infection, central venous injury, pneumothorax, possible venous stenosis, possible malpositioning in the venous system, and possible infections related to long-term catheter presence.  The patient was aware of these risks and agreed to proceed.  DESCRIPTION: After written full informed consent was obtained from the patient, the patient was taken back to the operating room.  Prior to induction, the patient was given IV antibiotics.  After obtaining adequate sedation, the patient was prepped and draped in the standard fashion for left arm access.  It was apparent that there were additional erosions  over the lateral arm of the arteriovenous graft, so replacement of the entire graft was necessary to avoid rupture and hemorrhagic shock.  I made an incision over the proximal aspect of both limbs of the graft and also just distal over the apex of the graft.  I dissected a pocket distally and bluntly dissected the start of a tunnel at this apical incision.  I then dissected out the proximal arms of this forearm loop arteriovenous graft with electrocautery and blunt dissection.  I dissected from the proximal exposure to the apical incision with a metal tunneler.  I passed a 6 mm graft through the metal tunnel.  I then dissected from the apical exposure to the proximal exposure.  I passed the graft through the metal tunnel.  Orientation of this graft was maintained throughout this process.  The patient was given 8000 units of Heparin intravenously, which was a therapeutic bolus.   After three minutes, I clamped both arms of the graft proximally.  I transected the graft arm distally.  I spatulated the venous arm of the old graft and the new graft to facilitate an end-to-end anastomosis.  I sewed the venous limb together with a CV-5.  I pulled the graft to tension in the tunnel.  I then similarly spatulated the arterial limb of the old graft and the new graft.  I completed this end-to-end anastomosis with a CV-5.  Prior to completing this, I backbled the arterial limb: excellent bleeding without clot.  I backbled the venous limb: limited backbleeding.  I passed a 4 Fogarty through the venous limb: no thrombus was noted on two passes.  I completed the anastomosis in the usual fashion.  I gave the patient 32  mg of Protamine to reverse the anticoagulation.  I had to repair the arterial anastomosis with interrupted stitches of CV-5 and 6-0 Prolene.  I also packed all incisions with thrombin and gelfoam.  After a few rounds of this this, bleeding from the new anastomosis stopped.  There was some venous bleeding from the  medial tunnel.  I held pressure for a few minutes.  After a few minutes, no further active bleeding was noted.  Both incisions were repaired with a layer of 3-0 Vicryl and then skin closed with a 4-0 Vicryl subcuticular stitch.  The skin was cleaned, dried, and reinforced with Dermabond.   I examined the erosions overlying the old graft.  I did not see any evidence of the graft, so I did not feel the graft needed to be resection.  Telfa was placed over both erosions and affixed with Tegraderms.   The patient was reprepped and redraped for a chest or neck tunneled dialysis catheter placement.   The cannulation site, the catheter exit site, and tract for the subcutaneous tunnel were then anesthestized with a total of 10 cc of 1% Lidocaine without epinepherine.  Under ultrasound guidance, the right internal jugular vein was cannulated with the 18 gauge needle.  A J-wire was then placed down into the inferior vena cava under fluoroscopic guidance.  The wire was then secured in place with a clamp to the drapes.  I then made stab incisions at the neck and exit sites.   I dissected from the exit site to the cannulation site with a metal tunneler.   The subcutaneous tunnel was dilated by passing a plastic dilator over the metal dissector. The wire was then unclamped and I removed the needle.  The skin tract and venotomy was dilated serially with dilators.  Finally, the dilator-sheath was placed under fluoroscopic guidance into the superior vena cava.  The dilator and wire were removed.  A 23 cm Diatek catheter was placed under fluoroscopic guidance down into the right atrium.  The sheath was broken and peeled away while holding the catheter cuff at the level of the skin.  The back end of this catheter was transected, revealing the two lumens of this catheter.  The ports were docked onto these two lumens.  The catheter hub was then screwed into place.  Each port was tested by aspirating and flushing.  No resistance was  noted.  Each port was then thoroughly flushed with heparinized saline.  The catheter was secured in placed with two interrupted stitches of 3-0 Nylon tied to the catheter.  The neck incision was closed with a U-stitch of 4-0 Monocryl.  The neck and chest incision were cleaned and sterile bandages applied.  Each port was then loaded with concentrated heparin (1000 Units/mL) at the manufacturer recommended volumes to each port.  Sterile caps were applied to each port.  On completion fluoroscopy, the tips of the catheter were in the right atrium, and there was no evidence of pneumothorax.   COMPLICATIONS: none  CONDITION: stable   Leonides Sake, MD [facility] Office: [phone] Pager: [phone]  01/12/2015, 9:46 AM

## 2015-01-12 NOTE — Transfer of Care (Signed)
Immediate Anesthesia Transfer of Care Note  Patient: Samantha [name]  Procedure(s) Performed: Procedure(s): REVISION OF Left FOREARM ARTERIOVENOUS GORETEX GRAFT (Left) INSERTION OF DIALYSIS CATHETER (Right)  Patient Location: PACU  Anesthesia Type:General  Level of Consciousness: awake, alert  and patient cooperative  Airway & Oxygen Therapy: Patient Spontanous Breathing and Patient connected to nasal cannula oxygen  Post-op Assessment: Report given to RN, Post -op Vital signs reviewed and stable and Patient moving all extremities  Post vital signs: Reviewed and stable  Last Vitals:  Filed Vitals:   01/12/15 0603  BP: 104/66  Temp: 36.7 C  Resp: 20    Complications: No apparent anesthesia complications

## 2015-01-12 NOTE — Interval H&P Note (Signed)
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 After consideration of risks, benefits and other options for treatment, the patient has consented to  Procedure(s): REVISION OF FOREARM ARTERIOVENOUS GORETEX GRAFT (Left) as a surgical intervention .  The patient's history has been reviewed, patient examined, no change in status, stable for surgery.  I have reviewed the patient's chart and labs.  Questions were answered to the patient's satisfaction.     Leonides Sake

## 2015-01-12 NOTE — Anesthesia Preprocedure Evaluation (Signed)
Anesthesia Evaluation  Patient identified by MRN, date of birth, ID band Patient awake    Reviewed: Allergy & Precautions, NPO status , Patient's Chart, lab work & pertinent test results  Airway Mallampati: II   Neck ROM: full    Dental   Pulmonary shortness of breath,    breath sounds clear to auscultation       Cardiovascular hypertension,  Rhythm:regular Rate:Normal     Neuro/Psych  Headaches,    GI/Hepatic PUD, (+) Hepatitis -  Endo/Other  diabetes, Type 2Hypothyroidism obese  Renal/GU ESRF and DialysisRenal disease     Musculoskeletal  (+) Arthritis ,   Abdominal   Peds  Hematology   Anesthesia Other Findings   Reproductive/Obstetrics                             Anesthesia Physical Anesthesia Plan  ASA: III  Anesthesia Plan: General   Post-op Pain Management:    Induction: Intravenous  Airway Management Planned: LMA  Additional Equipment:   Intra-op Plan:   Post-operative Plan:   Informed Consent: I have reviewed the patients History and Physical, chart, labs and discussed the procedure including the risks, benefits and alternatives for the proposed anesthesia with the patient or authorized representative who has indicated his/her understanding and acceptance.     Plan Discussed with: CRNA, Anesthesiologist and Surgeon  Anesthesia Plan Comments:         Anesthesia Quick Evaluation

## 2015-01-12 NOTE — H&P (View-Only) (Signed)
Established Dialysis Access  History of Present Illness  Samantha [name] is a 69 y.o. ([birth year]) female who presents for re-evaluation of left forearm AVG.  Patient was recently hospitalized and evaluated for possible left FA AVG infection.  Work-up at that time was negative.  CT and access duplex demonstrated only PSA without frank fluid or abscess around graft.  The patient returns with pain overlying medial arm of graft and scab overlying this arm of the graft  Past Medical History  Diagnosis Date  . Shoulder pain   . Headache(784.0)     occasionally  . Dizziness   . Occasional numbness/prickling/tingling of fingers and toes   . Arthritis     left shoulder  . Joint pain   . Joint swelling   . Gastric ulcer   . Dry skin   . Peripheral edema   . Constipation   . Oligouria   . H/O: GI bleed   . Hepatitis     Hep B  . History of kidney stones   . History of blood transfusion   . Hypertension   . Anemia   . Hypothyroidism     takes Synthroid daily  . Impaired hearing   . Diabetes mellitus     borderline  . Renal disorder     m, w, F     Past Surgical History  Procedure Laterality Date  . Shoulder surgery  3-89yrs ago    right replacement  . Esophagogastroduodenoscopy  06/09/2011    Procedure: ESOPHAGOGASTRODUODENOSCOPY (EGD);  Surgeon: Theda Belfast, MD;  Location: [facility] ENDOSCOPY;  Service: Endoscopy;  Laterality: N/A;  . Cataract surgery      bilateral  . Reverse shoulder arthroplasty  09/22/2011    Procedure: REVERSE SHOULDER ARTHROPLASTY;  Surgeon: Verlee Rossetti, MD;  Location: [facility] OR;  Service: Orthopedics;  Laterality: Left;  left reverse shoulder arthroplasty  . Cholecystectomy  12/26/2011  . Cholecystectomy  12/26/2011    Procedure: LAPAROSCOPIC CHOLECYSTECTOMY;  Surgeon: Atilano Ina, MD,FACS;  Location: [facility] OR;  Service: General;  Laterality: N/A;  . Arteriovenous graft placement Left     forearm  . Shuntogram Left 08/29/2012    Procedure: SHUNTOGRAM;   Surgeon: Fransisco Hertz, MD;  Location: [facility] CATH LAB;  Service: Cardiovascular;  Laterality: Left;  arm    Social History   Social History  . Marital Status: Widowed    Spouse Name: N/A  . Number of Children: N/A  . Years of Education: N/A   Occupational History  . Not on file.   Social History Main Topics  . Smoking status: Never Smoker   . Smokeless tobacco: Current User    Types: Chew  . Alcohol Use: No     Comment: quit 4-53yrs ago  . Drug Use: No  . Sexual Activity: No   Other Topics Concern  . Not on file   Social History Narrative    Family History  Problem Relation Age of Onset  . Hypertension Mother   . Diabetes Mother   . Cancer Brother     Current Outpatient Prescriptions  Medication Sig Dispense Refill  . acetaminophen (TYLENOL) 500 MG tablet Take 500 mg by mouth every 6 (six) hours as needed for moderate pain.    . cefTAZidime 2 g in dextrose 5 % 50 mL Inject 2 g into the vein every Monday, Wednesday, and Friday with hemodialysis.    Marland Kitchen cinacalcet (SENSIPAR) 30 MG tablet Take 30 mg by mouth daily with  supper.    . levothyroxine (SYNTHROID, LEVOTHROID) 50 MCG tablet Take 1 tablet (50 mcg total) by mouth daily before breakfast. 30 tablet 0  . metoprolol tartrate (LOPRESSOR) 25 MG tablet Take 25 mg by mouth 2 (two) times daily.    . pantoprazole (PROTONIX) 40 MG tablet Take 40 mg by mouth daily.  5  . HYDROcodone-acetaminophen (NORCO/VICODIN) 5-325 MG per tablet      No current facility-administered medications for this visit.     Allergies  Allergen Reactions  . Peanut Butter Flavor   . Tomato   . Banana Other (See Comments)    Due to dialysis  . Chocolate Other (See Comments)    Due to dialysis  . Fish-Derived Products Other (See Comments)    Due to gout     REVIEW OF SYSTEMS:  (Positives checked otherwise negative)  CARDIOVASCULAR:    chest pain,   chest pressure,   palpitations,   shortness of breath when laying flat,    shortness of breath with exertion,    pain in feet when walking,   pain in feet when laying flat,  history of blood clot in veins (DVT),   history of phlebitis,   swelling in legs,   varicose veins  PULMONARY:    productive cough,   asthma,   wheezing  NEUROLOGIC:    weakness in arms or legs,   numbness in arms or legs,   difficulty speaking or slurred speech,   temporary loss of vision in one eye,   dizziness  HEMATOLOGIC:    bleeding problems,   problems with blood clotting too easily  MUSCULOSKEL:    joint pain,  joint swelling  GASTROINTEST:    vomiting blood,   blood in stool     GENITOURINARY:    burning with urination,   blood in urine  ESRD-HD: M-W-F  PSYCHIATRIC:    history of major depression  INTEGUMENTARY:    rashes,   ulcers  CONSTITUTIONAL:    fever,   chills    Physical Examination  Filed Vitals:   01/08/15 0910  BP: 114/75  Pulse: 77  Temp: 98.4 F (36.9 C)  TempSrc: Oral  Height:  (1.448 m)  Weight: 179 lb 11.2 oz (81.511 kg)  SpO2: 100%   Body mass index is 38.88 kg/(m^2).  General: A&O x 3, WD, WN  Pulmonary: Sym exp, good air movt, CTAB, no rales, rhonchi, & wheezing  Cardiac: RRR, Nl S1, S2, no Murmurs, rubs or gallops  Vascular: Vessel Right Left  Radial Palpable Not Palpable  Ulnar Faintly Palpable Not Palpable  Brachial Palpable Palpable   Gastrointestinal: soft, NTND, no G/R, bo HSM, no masses, no CVAT B  Musculoskeletal: M/S 5/5 throughout , Extremities without  ischemic changes , palpable thrill in access in L FA AVG, bruit in access, scab overlying large pseudoaneurysm in medial arm of graft  Neurologic: Pain and light touch intact in extremities , Motor exam as listed above   Medical Decision Making  Samantha [name] is a 69 y.o. female who presents with ESRD requiring hemodialysis, left forearm AVG pseudoaneurysm at  risk for bleeding   I recommend: revision of the L FA AVG with interposition graft to avoid acute hemorrhage.  Depending on findings intraoperatively, I may have to remove the AVG  and place a TDC.  I had an extensive discussion with this patient in regards to the nature of access surgery, including risk, benefits, and alternatives.    The patient is aware that the risks of access surgery include but are not limited to: bleeding, infection, steal syndrome, nerve damage, ischemic monomelic neuropathy, failure of access to mature, and possible need for additional access procedures in the future.  The patient has agreed to proceed with the above procedure which will be scheduled 27 SEP 16.   Leonides Sake, MD [facility] [place] Office: [phone] Pager: [phone]  01/08/2015, 9:28 AM

## 2015-01-12 NOTE — Anesthesia Procedure Notes (Signed)
Procedure Name: LMA Insertion Date/Time: 01/12/2015 7:41 AM Performed by: Jerilee Hoh Pre-anesthesia Checklist: Patient identified, Emergency Drugs available, Suction available and Patient being monitored Patient Re-evaluated:Patient Re-evaluated prior to inductionOxygen Delivery Method: Circle system utilized Preoxygenation: Pre-oxygenation with 100% oxygen Intubation Type: IV induction LMA: LMA inserted LMA Size: 4.0 Tube type: Oral Number of attempts: 1 Placement Confirmation: positive ETCO2 and breath sounds checked- equal and bilateral Tube secured with: Tape Dental Injury: Teeth and Oropharynx as per pre-operative assessment

## 2015-01-12 NOTE — Discharge Instructions (Signed)
° ° °  01/12/2015 [name] [id number] [birth year]  Surgeon(s): Fransisco Hertz, MD  Procedure(s): REVISION OF Left FOREARM ARTERIOVENOUS GORETEX GRAFT INSERTION OF DIALYSIS CATHETER  x Do not stick graft for 4 weeks

## 2015-01-13 ENCOUNTER — Encounter (HOSPITAL_COMMUNITY): Payer: Self-pay | Admitting: Vascular Surgery

## 2015-01-13 ENCOUNTER — Telehealth: Payer: Self-pay | Admitting: Vascular Surgery

## 2015-01-13 MED FILL — Thrombin For Soln 20000 Unit: CUTANEOUS | Qty: 1 | Status: AC

## 2015-01-13 NOTE — Telephone Encounter (Signed)
-----   Message from Sharee Pimple, RN sent at 01/12/2015 10:30 AM EDT ----- Regarding: Schedule   ----- Message -----    From: Fransisco Hertz, MD    Sent: 01/12/2015  10:26 AM      To: [facility] [address] SHAENA PARKERSON [id number] 12-10-1945  PROCEDURE: 1.  Revision of left forearm looped arteriovenous graft 2.  Right internal jugular vein tunneled dialysis catheter placement 3.  Right internal jugular vein cannulation under ultrasound guidance  Asst: Doreatha Massed, PAC   Follow-up: 2 weeks for wound check, 4 weeks for post-op

## 2015-01-26 NOTE — Anesthesia Postprocedure Evaluation (Signed)
Anesthesia Post Note  Patient: Samantha [name]  Procedure(s) Performed: Procedure(s) (LRB): REVISION OF Left FOREARM ARTERIOVENOUS GORETEX GRAFT (Left) INSERTION OF DIALYSIS CATHETER (Right)  Anesthesia type: General  Patient location: PACU  Post pain: Pain level controlled and Adequate analgesia  Post assessment: Post-op Vital signs reviewed, Patient's Cardiovascular Status Stable, Respiratory Function Stable, Patent Airway and Pain level controlled  Last Vitals:  Filed Vitals:   01/12/15 1214  BP: 93/50  Pulse: 66  Temp:   Resp: 16    Post vital signs: Reviewed and stable  Level of consciousness: awake, alert  and oriented  Complications: No apparent anesthesia complications

## 2015-02-02 ENCOUNTER — Encounter: Payer: Self-pay | Admitting: Vascular Surgery

## 2015-02-05 ENCOUNTER — Encounter: Payer: Medicare Other | Admitting: Vascular Surgery

## 2015-02-10 ENCOUNTER — Encounter: Payer: Self-pay | Admitting: Vascular Surgery

## 2015-02-12 ENCOUNTER — Ambulatory Visit (INDEPENDENT_AMBULATORY_CARE_PROVIDER_SITE_OTHER): Payer: Medicare Other | Admitting: Vascular Surgery

## 2015-02-12 ENCOUNTER — Encounter: Payer: Self-pay | Admitting: Vascular Surgery

## 2015-02-12 VITALS — BP 98/65 | HR 67 | Ht <= 58 in | Wt 175.0 lb

## 2015-02-12 DIAGNOSIS — N186 End stage renal disease: Secondary | ICD-10-CM

## 2015-02-12 DIAGNOSIS — Z992 Dependence on renal dialysis: Secondary | ICD-10-CM

## 2015-02-12 NOTE — Progress Notes (Signed)
    Postoperative Access Visit   History of Present Illness  Samantha [name] is a 69 y.o. year old female who presents for postoperative follow-up for: Revision of L FA AVG, RIJV TDC (Date: 01/12/15).  The patient's wounds are healed.  The patient notes no steal symptoms.  The patient is able to complete their activities of daily living.  The patient's current symptoms are: swelling and pain in L forearm at revision site.  For VQI Use Only  PRE-ADM LIVING: Home  AMB STATUS: Ambulatory  Physical Examination Filed Vitals:   02/12/15 0833  BP: 98/65  Pulse: 67    LUE: Incisions are healed, skin feels warm, hand grip is 5/5, sensation in digits is intact, palpable thrill, bruit can be auscultated, significant swelling at proximal exposure of both limbs, mild swelling along route of graft  Medical Decision Making  Samantha [name] is a 69 y.o. year old female who presents s/p L FA AVG complicated with likely Goretex seroma.   Will pt follow up in 4 weeks.  If not resolved by then, will proceed with exploration.  The patient's access is not ready for use.  Thank you for allowing us to participate in this patient's care.  Leonides SakeBrian Jaquille Kau, MD [facility] [place] Office: [phone] Pager: [phone]  02/12/2015, 8:45 AM

## 2015-03-01 ENCOUNTER — Telehealth: Payer: Self-pay

## 2015-03-01 ENCOUNTER — Telehealth: Payer: Self-pay | Admitting: Vascular Surgery

## 2015-03-01 NOTE — Telephone Encounter (Signed)
Phone call from pt's son-in-law.  Reported pt. has worsening swelling of left arm over AVG site.  Reported the area looks "hard and tight."  Stated "her arm looks awful."  Denied fever/ chills.  Unable to confirm if there is redness or warmth.  Will discuss with Dr. Imogene Burnhen re: office visit versus scheduling for an exploration of the left arm AVG site, as was indicated in last progress note of 10/28.  Advised son will call him back with recommendation.  Agreed w/ plan.

## 2015-03-01 NOTE — Telephone Encounter (Signed)
-----   Message from Phillips Odorarol S Pullins, RN sent at 03/01/2015 12:25 PM EST ----- Regarding: reschedule f/u appt. Please reschedule appt. to 03/17/15 for Dr. Imogene Burnhen; please call son-in-law Baird KayLarry Henderson @ [phone].   ----- Message -----    From: Fransisco HertzBrian L Chen, MD    Sent: 03/01/2015  11:30 AM      To: Phillips Odorarol S Pullins, RN Subject: RE: need recommendation                        Office visit--not this this Friday ----- Message -----    From: Phillips Odorarol S Pullins, RN    Sent: 03/01/2015  10:03 AM      To: Fransisco HertzBrian L Chen, MD Subject: need recommendation                            Pt's son-in-law called and requested an appt. sooner than appt. 12/2. Please advise on next step, since you are completely booked on 11/18, and off on 11/25.  Per my Triage note today:    Phone call from pt's son-in-law. Reported pt. has worsening swelling of left arm over AVG site. Reported the area looks "hard and tight." Stated "her arm looks awful." Denied fever/ chills. Unable to confirm if there is redness or warmth. Will discuss with Dr. Imogene Burnhen re: office visit versus scheduling for an exploration of the left arm AVG site, as was indicated in last progress note of 10/28. Advised son will call him back with recommendation. Agreed w/ plan.

## 2015-03-01 NOTE — Telephone Encounter (Signed)
rec'd recommendation by Dr. Imogene Burnhen to schedule pt. For office evaluation at his next available time.  Will notify pt's son-in-law, Baird KayLarry Henderson.

## 2015-03-01 NOTE — Telephone Encounter (Signed)
Spoke with Peyton NajjarLarry who was adamant that she had to come on a Tuesday or Thursday. When I explained that she wouldn't see [facility] on those days, he agreed to bring her in on 03/17/15. I offered 4:00pm (the only opening) but he became agitated that I did not know her dialysis scheduled- we finally settled on 8:30am- which is overbooked, but he stated it HAD to be the first appointment. dpm

## 2015-03-15 ENCOUNTER — Encounter: Payer: Self-pay | Admitting: Vascular Surgery

## 2015-03-17 ENCOUNTER — Ambulatory Visit (INDEPENDENT_AMBULATORY_CARE_PROVIDER_SITE_OTHER): Payer: Medicare Other | Admitting: Vascular Surgery

## 2015-03-17 ENCOUNTER — Other Ambulatory Visit: Payer: Self-pay

## 2015-03-17 ENCOUNTER — Encounter: Payer: Self-pay | Admitting: Vascular Surgery

## 2015-03-17 VITALS — BP 117/77 | HR 65 | Ht <= 58 in | Wt 178.2 lb

## 2015-03-17 DIAGNOSIS — N186 End stage renal disease: Secondary | ICD-10-CM

## 2015-03-17 DIAGNOSIS — Z992 Dependence on renal dialysis: Secondary | ICD-10-CM

## 2015-03-17 NOTE — Progress Notes (Signed)
    Postoperative Access Visit   History of Present Illness  Samantha [name] is a 69 y.o. (11-10-1945) female  who presents for postoperative follow-up for: Revision of L FA AVG, RIJV TDC (Date: 01/12/15). The patient's wounds are healed. The patient notes no steal symptoms. The patient is able to complete their activities of daily living. The patient's current symptoms are: increasing swelling and pain in L forearm at revision site.  For VQI Use Only  PRE-ADM LIVING: Home  AMB STATUS: Ambulatory  Physical Examination Filed Vitals:   03/17/15 0826  BP: 117/77  Pulse: 65  Height: 4\' 9"  (1.448 m)  Weight: 178 lb 3.2 oz (80.831 kg)  SpO2: 100%   Pulmonary: Sym exp, good air movt, CTAB, no rales, rhonchi, & wheezing  Cardiac: RRR, Nl S1, S2, no Murmurs, rubs or gallops  LUE: Incisions are healed, skin feels warm, hand grip is 5/5, sensation in digits is intact, palpable thrill, bruit can be auscultated, increased swelling at proximal graft exposure and distally at apical incision   Medical Decision Making  Samantha Hockingerlie M Frosch is a 69 y.o. (11-10-1945) female  who presents s/p L FA AVG complicated with likely Goretex seroma.   I suspect this patient is having transudation through the graft material.  With the modern formulation of goretex, this is unusual, so I wonder if she has developed an allergy to the graft material.  Will schedule her for a: Exploration of L FA AVG, possible revision of the graft Risk, benefits, and alternatives to access surgery were discussed.  The patient is aware the risks include but are not limited to: bleeding, infection, steal syndrome, nerve damage, ischemic monomelic neuropathy, failure to mature, need for additional procedures, death and stroke.   The patient agrees to proceed forward with the procedure.  She will be scheduled this coming Tuesday 6 DEC 16  Leonides SakeBrian Chen, MD [facility] [place] Office: [phone] Pager:  [phone]  03/17/2015, 9:00 AM

## 2015-03-19 ENCOUNTER — Encounter: Payer: Medicare Other | Admitting: Vascular Surgery

## 2015-03-22 MED ORDER — LIDOCAINE-PRILOCAINE 2.5-2.5 % EX CREA: 1.0000 "application " | TOPICAL_CREAM | CUTANEOUS | Status: DC | PRN

## 2015-03-22 MED ORDER — LIDOCAINE HCL (PF) 1 % IJ SOLN: 5.0000 mL | INTRAMUSCULAR | Status: DC | PRN

## 2015-03-22 MED ORDER — DEXTROSE 5 % IV SOLN: 1.5000 g | INTRAVENOUS | Status: DC

## 2015-03-22 MED ORDER — SODIUM CHLORIDE 0.9 % IV SOLN: 100.0000 mL | INTRAVENOUS | Status: DC | PRN

## 2015-03-22 MED ORDER — ALTEPLASE 2 MG IJ SOLR: 2.0000 mg | Freq: Once | INTRAMUSCULAR | Status: DC | PRN

## 2015-03-22 MED ORDER — PENTAFLUOROPROP-TETRAFLUOROETH EX AERO: 1.0000 "application " | INHALATION_SPRAY | CUTANEOUS | Status: DC | PRN

## 2015-03-22 MED ORDER — SODIUM CHLORIDE 0.9 % IV SOLN: INTRAVENOUS | Status: DC

## 2015-03-22 MED ORDER — HEPARIN SODIUM (PORCINE) 1000 UNIT/ML DIALYSIS: 1000.0000 [IU] | INTRAMUSCULAR | Status: DC | PRN

## 2015-03-22 NOTE — Progress Notes (Signed)
I was unable to reach patient by phone.  I left  A message on voice mail.  I instructed the [facility] to [facility] at 5:30 AM  , nothing to eat or drink after midnight.   I instructed the patient to take the following medications in the am with just enough water to get them down:Levothyroxine, Metoprolol, Pantoprazole.If needed: Tylenol or Hydrocodone- Acetaminophen  I asked patient to not wear any lotions, powders, cologne, jewelry, piercing, make-up or nail polish.  I asked the patient to call [phone], in the am if there were any questions or problems.

## 2015-03-23 ENCOUNTER — Ambulatory Visit (HOSPITAL_COMMUNITY): Payer: Medicare Other | Admitting: Certified Registered Nurse Anesthetist

## 2015-03-23 ENCOUNTER — Ambulatory Visit (HOSPITAL_COMMUNITY)
Admission: RE | Admit: 2015-03-23 | Discharge: 2015-03-23 | Disposition: A | Discharge status: Home / Self Care | Payer: Medicare Other | Source: Ambulatory Visit | Attending: Vascular Surgery | Admitting: Vascular Surgery

## 2015-03-23 ENCOUNTER — Encounter: Payer: Self-pay | Admitting: *Deleted

## 2015-03-23 ENCOUNTER — Other Ambulatory Visit: Payer: Medicare Other | Admitting: *Deleted

## 2015-03-23 ENCOUNTER — Encounter (HOSPITAL_COMMUNITY): Payer: Self-pay | Admitting: Certified Registered Nurse Anesthetist

## 2015-03-23 ENCOUNTER — Encounter (HOSPITAL_COMMUNITY)
Admission: RE | Disposition: A | Discharge status: Home / Self Care | Payer: Self-pay | Source: Ambulatory Visit | Attending: Vascular Surgery

## 2015-03-23 DIAGNOSIS — N186 End stage renal disease: Secondary | ICD-10-CM

## 2015-03-23 DIAGNOSIS — L7634 Postprocedural seroma of skin and subcutaneous tissue following other procedure: Secondary | ICD-10-CM | POA: Diagnosis present

## 2015-03-23 DIAGNOSIS — I120 Hypertensive chronic kidney disease with stage 5 chronic kidney disease or end stage renal disease: Secondary | ICD-10-CM | POA: Insufficient documentation

## 2015-03-23 DIAGNOSIS — E669 Obesity, unspecified: Secondary | ICD-10-CM | POA: Diagnosis not present

## 2015-03-23 DIAGNOSIS — T82898A Other specified complication of vascular prosthetic devices, implants and grafts, initial encounter: Secondary | ICD-10-CM | POA: Diagnosis not present

## 2015-03-23 DIAGNOSIS — Y838 Other surgical procedures as the cause of abnormal reaction of the patient, or of later complication, without mention of misadventure at the time of the procedure: Secondary | ICD-10-CM | POA: Insufficient documentation

## 2015-03-23 DIAGNOSIS — E1122 Type 2 diabetes mellitus with diabetic chronic kidney disease: Secondary | ICD-10-CM | POA: Insufficient documentation

## 2015-03-23 DIAGNOSIS — M1990 Unspecified osteoarthritis, unspecified site: Secondary | ICD-10-CM | POA: Diagnosis not present

## 2015-03-23 HISTORY — PX: LIGATION ARTERIOVENOUS GORTEX GRAFT: SHX5947

## 2015-03-23 HISTORY — PX: I & D EXTREMITY: SHX5045

## 2015-03-23 LAB — GLUCOSE, CAPILLARY
Glucose-Capillary: 69 mg/dL (ref 65–99)
Glucose-Capillary: 68 mg/dL (ref 65–99)

## 2015-03-23 LAB — POCT I-STAT 4, (NA,K, GLUC, HGB,HCT)
Glucose, Bld: 82 mg/dL (ref 65–99)
HCT: 41 % (ref 36.0–46.0)
HEMOGLOBIN: 13.9 g/dL (ref 12.0–15.0)
Potassium: 4.4 mmol/L (ref 3.5–5.1)
SODIUM: 136 mmol/L (ref 135–145)

## 2015-03-23 SURGERY — IRRIGATION AND DEBRIDEMENT EXTREMITY
Anesthesia: General | Site: Arm Lower | Laterality: Left

## 2015-03-23 MED ORDER — 0.9 % SODIUM CHLORIDE (POUR BTL) OPTIME
TOPICAL | Status: DC | PRN
  Administered 2015-03-23: 1000 mL

## 2015-03-23 MED ORDER — ONDANSETRON HCL 4 MG/2ML IJ SOLN: 4.0000 mg | Freq: Four times a day (QID) | INTRAMUSCULAR | Status: DC | PRN

## 2015-03-23 MED ORDER — HYDROCODONE-ACETAMINOPHEN 5-325 MG PO TABS: 1.0000 | ORAL_TABLET | Freq: Four times a day (QID) | ORAL | Status: DC | PRN

## 2015-03-23 MED ORDER — THROMBIN 20000 UNITS EX SOLR
CUTANEOUS | Status: AC
  Filled 2015-03-23: qty 20000

## 2015-03-23 MED ORDER — EPHEDRINE SULFATE 50 MG/ML IJ SOLN
INTRAMUSCULAR | Status: AC
  Filled 2015-03-23: qty 1

## 2015-03-23 MED ORDER — ONDANSETRON HCL 4 MG/2ML IJ SOLN
INTRAMUSCULAR | Status: AC
  Filled 2015-03-23: qty 2

## 2015-03-23 MED ORDER — SODIUM CHLORIDE 0.9 % IV SOLN
INTRAVENOUS | Status: DC | PRN
  Administered 2015-03-23 (×2): via INTRAVENOUS

## 2015-03-23 MED ORDER — HEPARIN SODIUM (PORCINE) 1000 UNIT/ML IJ SOLN
1000.0000 [IU] | Freq: Once | INTRAMUSCULAR | Status: AC
  Administered 2015-03-23: 2200 [IU] via INTRAVENOUS

## 2015-03-23 MED ORDER — EPHEDRINE SULFATE 50 MG/ML IJ SOLN
INTRAMUSCULAR | Status: DC | PRN
  Administered 2015-03-23: 10 mg via INTRAVENOUS
  Administered 2015-03-23 (×2): 5 mg via INTRAVENOUS

## 2015-03-23 MED ORDER — OXYCODONE HCL 5 MG PO TABS: 5.0000 mg | ORAL_TABLET | Freq: Once | ORAL | Status: AC | PRN

## 2015-03-23 MED ORDER — MICROFIBRILLAR COLL HEMOSTAT EX PADS
MEDICATED_PAD | CUTANEOUS | Status: DC | PRN
  Administered 2015-03-23: 1 via TOPICAL

## 2015-03-23 MED ORDER — PROPOFOL 10 MG/ML IV BOLUS
INTRAVENOUS | Status: DC | PRN
  Administered 2015-03-23: 120 mg via INTRAVENOUS

## 2015-03-23 MED ORDER — PHENYLEPHRINE 40 MCG/ML (10ML) SYRINGE FOR IV PUSH (FOR BLOOD PRESSURE SUPPORT)
PREFILLED_SYRINGE | INTRAVENOUS | Status: AC
  Filled 2015-03-23: qty 10

## 2015-03-23 MED ORDER — CHLORHEXIDINE GLUCONATE CLOTH 2 % EX PADS: 6.0000 | MEDICATED_PAD | Freq: Once | CUTANEOUS | Status: DC

## 2015-03-23 MED ORDER — FENTANYL CITRATE (PF) 100 MCG/2ML IJ SOLN: 25.0000 ug | INTRAMUSCULAR | Status: DC | PRN

## 2015-03-23 MED ORDER — LIDOCAINE HCL (PF) 1 % IJ SOLN
INTRAMUSCULAR | Status: AC
  Filled 2015-03-23: qty 30

## 2015-03-23 MED ORDER — OXYCODONE HCL 5 MG/5ML PO SOLN
5.0000 mg | Freq: Once | ORAL | Status: AC | PRN
  Administered 2015-03-23: 5 mg via ORAL

## 2015-03-23 MED ORDER — ROCURONIUM BROMIDE 50 MG/5ML IV SOLN
INTRAVENOUS | Status: AC
  Filled 2015-03-23: qty 1

## 2015-03-23 MED ORDER — FENTANYL CITRATE (PF) 100 MCG/2ML IJ SOLN
INTRAMUSCULAR | Status: DC | PRN
  Administered 2015-03-23 (×3): 25 ug via INTRAVENOUS

## 2015-03-23 MED ORDER — LIDOCAINE HCL (CARDIAC) 20 MG/ML IV SOLN
INTRAVENOUS | Status: AC
  Filled 2015-03-23: qty 5

## 2015-03-23 MED ORDER — LIDOCAINE HCL (PF) 1 % IJ SOLN
INTRAMUSCULAR | Status: DC | PRN
  Administered 2015-03-23: 19 mL

## 2015-03-23 MED ORDER — PROPOFOL 10 MG/ML IV BOLUS
INTRAVENOUS | Status: AC
  Filled 2015-03-23: qty 20

## 2015-03-23 MED ORDER — ONDANSETRON HCL 4 MG/2ML IJ SOLN
INTRAMUSCULAR | Status: DC | PRN
  Administered 2015-03-23: 4 mg via INTRAVENOUS

## 2015-03-23 MED ORDER — MIDAZOLAM HCL 2 MG/2ML IJ SOLN
INTRAMUSCULAR | Status: AC
  Filled 2015-03-23: qty 2

## 2015-03-23 MED ORDER — DEXTROSE 5 % IV SOLN
INTRAVENOUS | Status: AC
  Administered 2015-03-23: 1.5 g via INTRAVENOUS
  Filled 2015-03-23: qty 1.5

## 2015-03-23 MED ORDER — SUCCINYLCHOLINE CHLORIDE 20 MG/ML IJ SOLN
INTRAMUSCULAR | Status: AC
  Filled 2015-03-23: qty 1

## 2015-03-23 MED ORDER — FENTANYL CITRATE (PF) 250 MCG/5ML IJ SOLN
INTRAMUSCULAR | Status: AC
  Filled 2015-03-23: qty 5

## 2015-03-23 MED ORDER — OXYCODONE HCL 5 MG/5ML PO SOLN
ORAL | Status: DC
Start: 2015-03-23 — End: 2015-03-23
  Filled 2015-03-23: qty 5

## 2015-03-23 MED ORDER — SODIUM CHLORIDE 0.9 % IV SOLN
INTRAVENOUS | Status: DC | PRN
  Administered 2015-03-23: 500 mL

## 2015-03-23 MED ORDER — GLYCOPYRROLATE 0.2 MG/ML IJ SOLN
INTRAMUSCULAR | Status: AC
  Filled 2015-03-23: qty 1

## 2015-03-23 MED ORDER — GLYCOPYRROLATE 0.2 MG/ML IJ SOLN
INTRAMUSCULAR | Status: DC | PRN
  Administered 2015-03-23: 0.1 mg via INTRAVENOUS

## 2015-03-23 SURGICAL SUPPLY — 50 items
BANDAGE ELASTIC 4 VELCRO ST LF (GAUZE/BANDAGES/DRESSINGS) ×4 IMPLANT
BANDAGE ELASTIC 6 VELCRO ST LF (GAUZE/BANDAGES/DRESSINGS) IMPLANT
BNDG GAUZE ELAST 4 BULKY (GAUZE/BANDAGES/DRESSINGS) ×4 IMPLANT
CANISTER SUCTION 2500CC (MISCELLANEOUS) ×4 IMPLANT
CLIP TI MEDIUM 6 (CLIP) ×4 IMPLANT
CLIP TI WIDE RED SMALL 6 (CLIP) ×4 IMPLANT
COVER PROBE W GEL 5X96 (DRAPES) ×4 IMPLANT
COVER SURGICAL LIGHT HANDLE (MISCELLANEOUS) ×8 IMPLANT
DECANTER SPIKE VIAL GLASS SM (MISCELLANEOUS) ×4 IMPLANT
DRAPE INCISE IOBAN 66X45 STRL (DRAPES) IMPLANT
DRAPE ORTHO SPLIT 77X108 STRL (DRAPES)
DRAPE PROXIMA HALF (DRAPES) ×4 IMPLANT
DRAPE SURG ORHT 6 SPLT 77X108 (DRAPES) IMPLANT
ELECT REM PT RETURN 9FT ADLT (ELECTROSURGICAL) ×4
ELECTRODE REM PT RTRN 9FT ADLT (ELECTROSURGICAL) ×2 IMPLANT
GAUZE SPONGE 4X4 12PLY STRL (GAUZE/BANDAGES/DRESSINGS) ×4 IMPLANT
GLOVE BIO SURGEON STRL SZ 6.5 (GLOVE) ×3 IMPLANT
GLOVE BIO SURGEON STRL SZ7 (GLOVE) ×8 IMPLANT
GLOVE BIO SURGEONS STRL SZ 6.5 (GLOVE) ×1
GLOVE BIOGEL PI IND STRL 6.5 (GLOVE) ×4 IMPLANT
GLOVE BIOGEL PI IND STRL 7.5 (GLOVE) ×2 IMPLANT
GLOVE BIOGEL PI INDICATOR 6.5 (GLOVE) ×4
GLOVE BIOGEL PI INDICATOR 7.5 (GLOVE) ×2
GLOVE SURG SS PI 7.0 STRL IVOR (GLOVE) ×4 IMPLANT
GOWN STRL REUS W/ TWL LRG LVL3 (GOWN DISPOSABLE) ×6 IMPLANT
GOWN STRL REUS W/TWL LRG LVL3 (GOWN DISPOSABLE) ×6
HEMOSTAT SPONGE AVITENE ULTRA (HEMOSTASIS) ×4 IMPLANT
KIT BASIN OR (CUSTOM PROCEDURE TRAY) ×4 IMPLANT
KIT ROOM TURNOVER OR (KITS) ×4 IMPLANT
LIQUID BAND (GAUZE/BANDAGES/DRESSINGS) ×4 IMPLANT
NEEDLE 18GX1X1/2 (RX/OR ONLY) (NEEDLE) ×4 IMPLANT
NEEDLE HYPO 25GX1X1/2 BEV (NEEDLE) ×4 IMPLANT
NS IRRIG 1000ML POUR BTL (IV SOLUTION) ×4 IMPLANT
PACK CV ACCESS (CUSTOM PROCEDURE TRAY) ×4 IMPLANT
PACK GENERAL/GYN (CUSTOM PROCEDURE TRAY) IMPLANT
PAD ARMBOARD 7.5X6 YLW CONV (MISCELLANEOUS) ×8 IMPLANT
SPONGE GAUZE 4X4 12PLY STER LF (GAUZE/BANDAGES/DRESSINGS) ×4 IMPLANT
SPONGE SURGIFOAM ABS GEL 100 (HEMOSTASIS) IMPLANT
SUT ETHILON 3 0 PS 1 (SUTURE) IMPLANT
SUT MNCRL AB 4-0 PS2 18 (SUTURE) ×4 IMPLANT
SUT PROLENE 5 0 C 1 24 (SUTURE) ×8 IMPLANT
SUT PROLENE 6 0 BV (SUTURE) ×8 IMPLANT
SUT PROLENE 7 0 BV 1 (SUTURE) IMPLANT
SUT SILK 2 0 SH (SUTURE) ×4 IMPLANT
SUT VIC AB 2-0 CTX 36 (SUTURE) IMPLANT
SUT VIC AB 3-0 SH 27 (SUTURE) ×4
SUT VIC AB 3-0 SH 27X BRD (SUTURE) ×4 IMPLANT
SYRINGE 10CC LL (SYRINGE) ×4 IMPLANT
UNDERPAD 30X30 INCONTINENT (UNDERPADS AND DIAPERS) ×4 IMPLANT
WATER STERILE IRR 1000ML POUR (IV SOLUTION) ×4 IMPLANT

## 2015-03-23 NOTE — Anesthesia Preprocedure Evaluation (Signed)
Anesthesia Evaluation  Patient identified by MRN, date of birth, ID band Patient awake    Reviewed: Allergy & Precautions, NPO status , Patient's Chart, lab work & pertinent test results  Airway Mallampati: II   Neck ROM: full    Dental   Pulmonary shortness of breath,    breath sounds clear to auscultation       Cardiovascular hypertension,  Rhythm:regular Rate:Normal     Neuro/Psych  Headaches,    GI/Hepatic PUD, (+) Hepatitis -  Endo/Other  diabetes, Type 2Hypothyroidism Obese.  Renal/GU ESRF and DialysisRenal disease     Musculoskeletal  (+) Arthritis ,   Abdominal   Peds  Hematology   Anesthesia Other Findings   Reproductive/Obstetrics                             Anesthesia Physical Anesthesia Plan  ASA: III  Anesthesia Plan: General   Post-op Pain Management:    Induction: Intravenous  Airway Management Planned: LMA  Additional Equipment:   Intra-op Plan:   Post-operative Plan:   Informed Consent: I have reviewed the patients History and Physical, chart, labs and discussed the procedure including the risks, benefits and alternatives for the proposed anesthesia with the patient or authorized representative who has indicated his/her understanding and acceptance.     Plan Discussed with: CRNA, Anesthesiologist and Surgeon  Anesthesia Plan Comments:         Anesthesia Quick Evaluation

## 2015-03-23 NOTE — Anesthesia Postprocedure Evaluation (Signed)
Anesthesia Post Note  Patient: Samantha [name]  Procedure(s) Performed: Procedure(s) (LRB): DRAINAGE OF LEFT ARM SEROMA (Left) LIGATION AND EXCISION OF LEFT ARTERIOVENOUS GORTEX GRAFT (Left)  Patient location during evaluation: PACU Anesthesia Type: General Level of consciousness: awake and alert and patient cooperative Pain management: pain level controlled Vital Signs Assessment: post-procedure vital signs reviewed and stable Respiratory status: spontaneous breathing and respiratory function stable Cardiovascular status: stable Anesthetic complications: no    Last Vitals:  Filed Vitals:   03/23/15 1000 03/23/15 1011  BP:  93/66  Pulse: 85 83  Temp: 36.2 C   Resp: 14 16    Last Pain:  Filed Vitals:   03/23/15 1017  PainSc: 0-No pain                 [name] S

## 2015-03-23 NOTE — Interval H&P Note (Signed)
History and Physical Interval Note:  03/23/2015 7:12 AM  Samantha [name]  has presented today for surgery, with the diagnosis of Left arm arteriovenous graft seroma T88.8XXD  The various methods of treatment have been discussed with the patient and family. After consideration of risks, benefits and other options for treatment, the patient has consented to  Procedure(s): DRAINAGE OF SEROMA (Left) POSSIBLE REVISION OF ARTERIOVENOUS GORETEX GRAFT (Left) as a surgical intervention .  The patient's history has been reviewed, patient examined, no change in status, stable for surgery.  I have reviewed the patient's chart and labs.  Questions were answered to the patient's satisfaction.     Leonides Sakehen, Alistair Senft

## 2015-03-23 NOTE — Op Note (Signed)
    OPERATIVE NOTE   PROCEDURE: 1. Incision and drainage of left forearm seroma 2. Excision of left forearm arteriovenous graft   PRE-OPERATIVE DIAGNOSIS: Symptomatic Left forearm seroma due to Goretex reaction  POST-OPERATIVE DIAGNOSIS: same as above   SURGEON: Leonides SakeBrian Matteus Mcnelly, MD  ASSISTANT(S): Karsten RoKim Trinh, PAC   ANESTHESIA: general  ESTIMATED BLOOD LOSS: 50 cc  FINDING(S): 1.  Goretex seroma with gelatinous material present with serous fluid in both graft exposure incisions 2.  No incorporation of prior graft 3.  No evidence of infection  SPECIMEN(S):  none  INDICATIONS:   Samantha [name] is a 69 y.o. female who presents with increasing swelling in left forearm after revision of her left forearm arteriovenous graft.  I had a suspicion this was related to a Goretex reaction resulting in a Goretex seroma.  I recommended we return to the OR to evacuate the seroma and possibly revision the arteriovenous graft.  Risk, benefits, and alternatives to access surgery were discussed.  The patient is aware the risks include but are not limited to: bleeding, infection, steal syndrome, nerve damage, ischemic monomelic neuropathy, failure to mature, need for additional procedures, death and stroke.  The patient agrees to proceed forward with the procedure.   DESCRIPTION: After obtaining full informed written consent, the patient was brought back to the operating room and placed supine upon the operating table.  The patient received IV antibiotics prior to induction.  After obtaining adequate anesthesia, the patient was prepped and draped in the standard fashion for: left arm access procedure.  I injected a total of 20 cc of 1% lidocaine without epinephrine in both the prior proximal forearm incision and apical incision.  I made an incision over the proximal forearm incision.  This decompressed pressurized serous fluid.  There was gelatinous material present over the graft, consistent with a Goretex  reaction.  There was no evidence of infection.  I verified that standard wall Goretex was used during the prior operation.  I felt that replacing this graft with another PTFE graft was likely to result in the same reaction, so I felt ligating this arteriovenous graft would be best.  I tend clamped the arterial and venous arms of this graft and then oversewed the proximal ends of both graft limbs with a double layer of 5-0 Prolene.  I excised some of the seroma wall to facilitate healing.  I then made an incision over the apical incision and again decompressed serous fluid and some gelatinous material.  In exploring the graft tract to decompress serous fluid, I realized that none of this new graft was incorporated.  I gentle pulled on this graft and removed the entire length without much difficulty.  I washed all surgical wounds.  I packed Avitene into all incisions.  After a few minute, no further active bleeding was present.  I reapproximated the subcutaneous tissue in both incisions with a running stitch of 3-0 Vicryl.  The skin was then reapproximated with a running stitch of 4-0 Monocryl.  The skin was cleaned, dried, and reinforced with Dermabond.   COMPLICATIONS: none  CONDITION: stable   Leonides SakeBrian Quenesha Douglass, MD [facility] [place] Office: [phone] Pager: [phone]  03/23/2015, 8:45 AM

## 2015-03-23 NOTE — Anesthesia Procedure Notes (Signed)
Procedure Name: LMA Insertion Date/Time: 03/23/2015 7:41 AM Performed by: Little IshikawaMERCER, Jaxn Chiquito L Pre-anesthesia Checklist: Patient identified, Timeout performed, Emergency Drugs available, Suction available and Patient being monitored Patient Re-evaluated:Patient Re-evaluated prior to inductionOxygen Delivery Method: Circle system utilized Preoxygenation: Pre-oxygenation with 100% oxygen Intubation Type: IV induction LMA: LMA inserted LMA Size: 4.0 Number of attempts: 1 Placement Confirmation: positive ETCO2 and breath sounds checked- equal and bilateral Tube secured with: Tape Dental Injury: Teeth and Oropharynx as per pre-operative assessment

## 2015-03-23 NOTE — Progress Notes (Signed)
Dr. Chaney MallingHodierne updated. CBG remains 69-67. Pt has DM diagnosis on chart but family denies and pt has no evidence of elevated Glu on chart. Pt tolerating fluids and released to PH 2 .

## 2015-03-23 NOTE — H&P (View-Only) (Signed)
    Postoperative Access Visit   History of Present Illness  Samantha [name] is a 69 y.o. (02/08/1946) female  who presents for postoperative follow-up for: Revision of L FA AVG, RIJV TDC (Date: 01/12/15). The patient's wounds are healed. The patient notes no steal symptoms. The patient is able to complete their activities of daily living. The patient's current symptoms are: increasing swelling and pain in L forearm at revision site.  For VQI Use Only  PRE-ADM LIVING: Home  AMB STATUS: Ambulatory  Physical Examination Filed Vitals:   03/17/15 0826  BP: 117/77  Pulse: 65  Height: 4' 9" (1.448 m)  Weight: 178 lb 3.2 oz (80.831 kg)  SpO2: 100%   Pulmonary: Sym exp, good air movt, CTAB, no rales, rhonchi, & wheezing  Cardiac: RRR, Nl S1, S2, no Murmurs, rubs or gallops  LUE: Incisions are healed, skin feels warm, hand grip is 5/5, sensation in digits is intact, palpable thrill, bruit can be auscultated, increased swelling at proximal graft exposure and distally at apical incision   Medical Decision Making  Samantha [name] is a 69 y.o. (04/14/1946) female  who presents s/p L FA AVG complicated with likely Goretex seroma.   I suspect this patient is having transudation through the graft material.  With the modern formulation of goretex, this is unusual, so I wonder if she has developed an allergy to the graft material.  Will schedule her for a: Exploration of L FA AVG, possible revision of the graft Risk, benefits, and alternatives to access surgery were discussed.  The patient is aware the risks include but are not limited to: bleeding, infection, steal syndrome, nerve damage, ischemic monomelic neuropathy, failure to mature, need for additional procedures, death and stroke.   The patient agrees to proceed forward with the procedure.  She will be scheduled this coming Tuesday 6 DEC 16  Kieffer Blatz, MD [facility] Office: [phone] Pager:  [phone]  03/17/2015, 9:00 AM   

## 2015-03-23 NOTE — Transfer of Care (Signed)
Immediate Anesthesia Transfer of Care Note  Patient: Samantha [name]  Procedure(s) Performed: Procedure(s): DRAINAGE OF LEFT ARM SEROMA (Left) LIGATION AND EXCISION OF LEFT ARTERIOVENOUS GORTEX GRAFT (Left)  Patient Location: PACU  Anesthesia Type:General  Level of Consciousness: awake  Airway & Oxygen Therapy: Patient Spontanous Breathing and Patient connected to nasal cannula oxygen  Post-op Assessment: Report given to RN, Post -op Vital signs reviewed and stable and Patient moving all extremities X 4  Post vital signs: stable  Last Vitals:  Filed Vitals:   03/23/15 0620 03/23/15 0909  BP: 98/57 81/63  Pulse: 71 91  Temp: 36.9 C 36.3 C  Resp: 16 15    Complications: No apparent anesthesia complications

## 2015-03-23 NOTE — Addendum Note (Signed)
Addendum  created 03/23/15 1129 by Epifanio LeschesKari L Jasmine Maceachern, CRNA   Modules edited: Anesthesia Attestations

## 2015-03-24 ENCOUNTER — Encounter (HOSPITAL_COMMUNITY): Payer: Self-pay | Admitting: Vascular Surgery

## 2015-03-25 ENCOUNTER — Telehealth: Payer: Self-pay | Admitting: Vascular Surgery

## 2015-03-25 NOTE — Telephone Encounter (Signed)
LM with pts daughter, as Ms Samantha [name] was asleep, dpm

## 2015-03-25 NOTE — Telephone Encounter (Signed)
-----   Message from Sharee PimpleMarilyn K McChesney, RN sent at 03/23/2015  9:39 AM EST ----- Regarding: schedule   ----- Message -----    From: Raymond GurneyKimberly A Trinh, PA-C    Sent: 03/23/2015   9:07 AM      To: Vvs Charge Pool  S/p excision of left arm loop graft 03/23/15  F/u with Dr. Imogene Burnhen in 4 weeks with upper extremity vein mapping for new access  Thanks Selena BattenKim

## 2015-04-15 ENCOUNTER — Encounter: Payer: Self-pay | Admitting: Vascular Surgery

## 2015-04-16 ENCOUNTER — Encounter (HOSPITAL_COMMUNITY): Payer: Medicare Other

## 2015-04-20 ENCOUNTER — Other Ambulatory Visit: Payer: Self-pay | Admitting: Vascular Surgery

## 2015-04-20 ENCOUNTER — Ambulatory Visit (HOSPITAL_COMMUNITY)
Admission: RE | Admit: 2015-04-20 | Discharge: 2015-04-20 | Disposition: A | Discharge status: Home / Self Care | Payer: Medicare Other | Source: Ambulatory Visit | Attending: Vascular Surgery | Admitting: Vascular Surgery

## 2015-04-20 DIAGNOSIS — N186 End stage renal disease: Secondary | ICD-10-CM | POA: Diagnosis not present

## 2015-04-20 DIAGNOSIS — I120 Hypertensive chronic kidney disease with stage 5 chronic kidney disease or end stage renal disease: Secondary | ICD-10-CM | POA: Insufficient documentation

## 2015-04-20 DIAGNOSIS — E1122 Type 2 diabetes mellitus with diabetic chronic kidney disease: Secondary | ICD-10-CM | POA: Diagnosis not present

## 2015-04-20 DIAGNOSIS — Z01810 Encounter for preprocedural cardiovascular examination: Secondary | ICD-10-CM | POA: Diagnosis present

## 2015-04-23 ENCOUNTER — Encounter: Payer: Self-pay | Admitting: Vascular Surgery

## 2015-04-23 ENCOUNTER — Ambulatory Visit (INDEPENDENT_AMBULATORY_CARE_PROVIDER_SITE_OTHER): Payer: Medicare Other | Admitting: Vascular Surgery

## 2015-04-23 VITALS — BP 106/62 | HR 74 | Temp 98.3°F | Resp 16 | Ht 59.5 in | Wt 174.0 lb

## 2015-04-23 DIAGNOSIS — T82898D Other specified complication of vascular prosthetic devices, implants and grafts, subsequent encounter: Secondary | ICD-10-CM

## 2015-04-23 DIAGNOSIS — N186 End stage renal disease: Secondary | ICD-10-CM

## 2015-04-23 DIAGNOSIS — T82511D Breakdown (mechanical) of surgically created arteriovenous shunt, subsequent encounter: Secondary | ICD-10-CM

## 2015-04-23 NOTE — Progress Notes (Signed)
    Postoperative Access Visit   History of Present Illness  Samantha [name] is a 70 y.o. year old female who presents for postoperative follow-up for: excision of L FA AVG, drainage of seroma (Date: 03/23/15).  The patient's wounds are healed.  The patient notes no steal symptoms.  The patient is able to complete their activities of daily living.  The patient's current symptoms are: mild residual pain in incisions.  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  Unfortunately, she developed a severe reaction to the Goretex used for replacement, resulting in a Goretex seroma with continued transudative fluid accumulation.  The AVG was removed due to the non-incorporation.  Work-up for infection has been negative.  For VQI Use Only  PRE-ADM LIVING: Home  AMB STATUS: Ambulatory  Physical Examination Filed Vitals:   04/23/15 0820  BP: 106/62  Pulse: 74  Temp: 98.3 F (36.8 C)  Resp: 16   GEN inappropriate response to questioning, easily distracted LUE: Incisions are healed, skin feels warm, hand grip is 5/5, sensation in digits is intact, no palpable thrill, bruit can not be auscultated   Medical Decision Making  Samantha [name] is a 70 y.o. year old female who presents s/p L FA AVG Excision.   Patient is demonstrating some elements of early onset dementia.  She has decided to continue HD via a RIJV TDC at this point and forgo any further permanent access.  Given her dementia, unfortunately I suspect that she might be responsive to any suggestive nudging, so I would not be surprised if she changes her mind.  Thank you for allowing us to participate in this patient's care.  Leonides SakeBrian Chen, MD [facility] [place] Office: [phone] Pager: [phone]  04/23/2015, 8:36 AM

## 2015-06-04 ENCOUNTER — Telehealth: Payer: Self-pay | Admitting: *Deleted

## 2015-06-04 ENCOUNTER — Encounter: Payer: Self-pay | Admitting: Vascular Surgery

## 2015-06-04 NOTE — Telephone Encounter (Signed)
I spoke with Shamonica, pts granddaughter. She states that she is POA. I scheduled the appointment with her for 02/24 @ 9:00am.

## 2015-06-04 NOTE — Telephone Encounter (Signed)
Spoke to [place] at [facility]  [phone]  Dr. Arrie Aran sent fax request for this patient to have AVG placement asap.   Patient was evaluated 04-23-15 by Dr. Imogene Burn and per that note, pt has dementia. She also has a history of reaction to Goretex uses as a replacement in a previous revision. That AVG was removed due to this reaction. According to Thurston Hole, this patient's POA, her daughter, has passed away and the [facility] is unsure as to who actually has POA at this time. It may be the son-in-law.  In light of all of this, Dr. Imogene Burn would like for the patient to be seen in the office, with her current POA, to discuss what access options she has at this time. Thurston Hole will let Dr. Arrie Aran know about this and we will contact family to make the appt to see Dr. Imogene Burn asap.

## 2015-06-11 ENCOUNTER — Ambulatory Visit (INDEPENDENT_AMBULATORY_CARE_PROVIDER_SITE_OTHER): Payer: Medicare Other | Admitting: Vascular Surgery

## 2015-06-11 ENCOUNTER — Encounter: Payer: Self-pay | Admitting: Vascular Surgery

## 2015-06-11 VITALS — BP 128/82 | HR 85 | Ht 59.5 in | Wt 174.9 lb

## 2015-06-11 DIAGNOSIS — Z992 Dependence on renal dialysis: Secondary | ICD-10-CM

## 2015-06-11 DIAGNOSIS — N186 End stage renal disease: Secondary | ICD-10-CM | POA: Diagnosis not present

## 2015-06-11 NOTE — Progress Notes (Signed)
Established Dialysis Access  History of Present Illness  Samantha [name] is a 70 y.o. (03-06-1946) female who presents for re-evaluation for permanent access.  This patient has previously had a L FA AVG which was revised but became infected and required removal.  Due to some deterioration her functional status, the patient and family had agreed to continue with [facility] based HD and forgo further permanent access placement.  Her POA, daughter, had an untimely death and her grandchildren became the pt's POA.  They return today for re-evaluation for permanent access.  The patient's PMH, PSH, SH, and FamHx are unchanged from 04/23/15.  Current Outpatient Prescriptions  Medication Sig Dispense Refill  . acetaminophen (TYLENOL) 500 MG tablet Take 500 mg by mouth every 6 (six) hours as needed for moderate pain.    Marland Kitchen aspirin 325 MG tablet Take 325 mg by mouth daily.    . cefTAZidime 2 g in dextrose 5 % 50 mL Inject 2 g into the vein every Monday, Wednesday, and Friday with hemodialysis.    Marland Kitchen cinacalcet (SENSIPAR) 30 MG tablet Take 30 mg by mouth daily with supper.    Marland Kitchen HYDROcodone-acetaminophen (NORCO/VICODIN) 5-325 MG tablet Take 1 tablet by mouth every 6 (six) hours as needed for moderate pain. 30 tablet 0  . levothyroxine (SYNTHROID, LEVOTHROID) 50 MCG tablet     . lidocaine-prilocaine (EMLA) cream     . metoprolol tartrate (LOPRESSOR) 25 MG tablet Take 25 mg by mouth 2 (two) times daily.    . pantoprazole (PROTONIX) 40 MG tablet Take 40 mg by mouth daily.  5   No current facility-administered medications for this visit.    Allergies  Allergen Reactions  . Peanut Butter Flavor   . Tomato   . Banana Other (See Comments)    Due to dialysis  . Chocolate Other (See Comments)    Due to dialysis  . Fish-Derived Products Other (See Comments)    Due to gout    On ROS today: no further wounds, able to completed ADL   Physical Examination  Filed Vitals:   06/11/15 0915  BP: 128/82  Pulse:  85  Height: 4' 11.5" (1.511 m)  Weight: 174 lb 14.4 oz (79.334 kg)  SpO2: 100%   Body mass index is 34.75 kg/(m^2).  General: Alert, not oriented  Pulmonary: Sym exp, good air movt, CTAB, no rales, rhonchi, & wheezing  Cardiac: RRR, Nl S1, S2, no Murmurs, rubs or gallops  Vascular: Vessel Right Left  Radial Palpable Faintly Palpable  Ulnar Not Palpable Not Palpable  Brachial Palpable Palpable   Gastrointestinal: soft, NTND, no G/R, bo HSM, no masses, no CVAT B  Musculoskeletal: M/S 5/5 throughout , Extremities without  ischemic changes, prior incisions healed, no erythema in LUE, +palpable residual graft  Neurologic: Pain and light touch intact in extremities , Motor exam as listed above   Medical Decision Making  Samantha [name] is a 70 y.o. female who presents with ESRD requiring hemodialysis, AMS vs dementia   This patient's functional status today is completely different from previous visits.  She is not interactive and not answering questions.  At this point, I discussed with the family, I would defer any further permanent access and get her mental status evaluated first given the significant change from baseline.  In the event permanent access is going to pursue in the future.  Based on vein mapping and examination, this patient's permanent access options include: possible L single stage BVT, R staged BVT  Leonides Sake, MD [facility] [place] Office: [phone] Pager: [phone]  06/11/2015, 6:24 PM

## 2015-06-14 ENCOUNTER — Inpatient Hospital Stay (HOSPITAL_COMMUNITY)
Admission: EM | Admit: 2015-06-14 | Discharge: 2015-06-19 | DRG: 441 | Disposition: A | Discharge status: Skilled Nursing Facility | Payer: Medicare Other | Attending: Internal Medicine | Admitting: Internal Medicine

## 2015-06-14 ENCOUNTER — Encounter (HOSPITAL_COMMUNITY): Payer: Self-pay | Admitting: Emergency Medicine

## 2015-06-14 ENCOUNTER — Emergency Department (HOSPITAL_COMMUNITY): Payer: Medicare Other

## 2015-06-14 DIAGNOSIS — Z66 Do not resuscitate: Secondary | ICD-10-CM | POA: Diagnosis present

## 2015-06-14 DIAGNOSIS — Z833 Family history of diabetes mellitus: Secondary | ICD-10-CM

## 2015-06-14 DIAGNOSIS — I959 Hypotension, unspecified: Secondary | ICD-10-CM | POA: Diagnosis present

## 2015-06-14 DIAGNOSIS — K7460 Unspecified cirrhosis of liver: Secondary | ICD-10-CM | POA: Diagnosis present

## 2015-06-14 DIAGNOSIS — Z87442 Personal history of urinary calculi: Secondary | ICD-10-CM | POA: Diagnosis not present

## 2015-06-14 DIAGNOSIS — K922 Gastrointestinal hemorrhage, unspecified: Secondary | ICD-10-CM | POA: Insufficient documentation

## 2015-06-14 DIAGNOSIS — N2581 Secondary hyperparathyroidism of renal origin: Secondary | ICD-10-CM | POA: Diagnosis present

## 2015-06-14 DIAGNOSIS — Z91013 Allergy to seafood: Secondary | ICD-10-CM | POA: Diagnosis not present

## 2015-06-14 DIAGNOSIS — M899 Disorder of bone, unspecified: Secondary | ICD-10-CM | POA: Diagnosis present

## 2015-06-14 DIAGNOSIS — R1031 Right lower quadrant pain: Secondary | ICD-10-CM | POA: Diagnosis not present

## 2015-06-14 DIAGNOSIS — N949 Unspecified condition associated with female genital organs and menstrual cycle: Secondary | ICD-10-CM | POA: Diagnosis not present

## 2015-06-14 DIAGNOSIS — F17220 Nicotine dependence, chewing tobacco, uncomplicated: Secondary | ICD-10-CM | POA: Diagnosis present

## 2015-06-14 DIAGNOSIS — B1910 Unspecified viral hepatitis B without hepatic coma: Secondary | ICD-10-CM | POA: Diagnosis present

## 2015-06-14 DIAGNOSIS — N186 End stage renal disease: Secondary | ICD-10-CM | POA: Diagnosis present

## 2015-06-14 DIAGNOSIS — Z9842 Cataract extraction status, left eye: Secondary | ICD-10-CM

## 2015-06-14 DIAGNOSIS — D709 Neutropenia, unspecified: Secondary | ICD-10-CM | POA: Diagnosis present

## 2015-06-14 DIAGNOSIS — N189 Chronic kidney disease, unspecified: Secondary | ICD-10-CM

## 2015-06-14 DIAGNOSIS — G9340 Encephalopathy, unspecified: Secondary | ICD-10-CM | POA: Diagnosis not present

## 2015-06-14 DIAGNOSIS — E872 Acidosis, unspecified: Secondary | ICD-10-CM | POA: Insufficient documentation

## 2015-06-14 DIAGNOSIS — Z96612 Presence of left artificial shoulder joint: Secondary | ICD-10-CM | POA: Diagnosis present

## 2015-06-14 DIAGNOSIS — H9190 Unspecified hearing loss, unspecified ear: Secondary | ICD-10-CM | POA: Diagnosis present

## 2015-06-14 DIAGNOSIS — Z6837 Body mass index (BMI) 37.0-37.9, adult: Secondary | ICD-10-CM | POA: Diagnosis not present

## 2015-06-14 DIAGNOSIS — Z8711 Personal history of peptic ulcer disease: Secondary | ICD-10-CM | POA: Diagnosis not present

## 2015-06-14 DIAGNOSIS — A419 Sepsis, unspecified organism: Secondary | ICD-10-CM

## 2015-06-14 DIAGNOSIS — Z809 Family history of malignant neoplasm, unspecified: Secondary | ICD-10-CM

## 2015-06-14 DIAGNOSIS — Z7982 Long term (current) use of aspirin: Secondary | ICD-10-CM | POA: Diagnosis not present

## 2015-06-14 DIAGNOSIS — R109 Unspecified abdominal pain: Secondary | ICD-10-CM | POA: Diagnosis present

## 2015-06-14 DIAGNOSIS — Z9841 Cataract extraction status, right eye: Secondary | ICD-10-CM | POA: Diagnosis not present

## 2015-06-14 DIAGNOSIS — K3189 Other diseases of stomach and duodenum: Secondary | ICD-10-CM | POA: Diagnosis present

## 2015-06-14 DIAGNOSIS — R1110 Vomiting, unspecified: Secondary | ICD-10-CM | POA: Diagnosis not present

## 2015-06-14 DIAGNOSIS — D631 Anemia in chronic kidney disease: Secondary | ICD-10-CM | POA: Diagnosis present

## 2015-06-14 DIAGNOSIS — D61818 Other pancytopenia: Secondary | ICD-10-CM | POA: Diagnosis present

## 2015-06-14 DIAGNOSIS — E669 Obesity, unspecified: Secondary | ICD-10-CM | POA: Diagnosis present

## 2015-06-14 DIAGNOSIS — I120 Hypertensive chronic kidney disease with stage 5 chronic kidney disease or end stage renal disease: Secondary | ICD-10-CM | POA: Diagnosis present

## 2015-06-14 DIAGNOSIS — Z91018 Allergy to other foods: Secondary | ICD-10-CM | POA: Diagnosis not present

## 2015-06-14 DIAGNOSIS — K7290 Hepatic failure, unspecified without coma: Secondary | ICD-10-CM | POA: Diagnosis present

## 2015-06-14 DIAGNOSIS — E1122 Type 2 diabetes mellitus with diabetic chronic kidney disease: Secondary | ICD-10-CM | POA: Diagnosis present

## 2015-06-14 DIAGNOSIS — N9489 Other specified conditions associated with female genital organs and menstrual cycle: Secondary | ICD-10-CM

## 2015-06-14 DIAGNOSIS — Z992 Dependence on renal dialysis: Secondary | ICD-10-CM

## 2015-06-14 DIAGNOSIS — K921 Melena: Secondary | ICD-10-CM | POA: Diagnosis present

## 2015-06-14 DIAGNOSIS — R1032 Left lower quadrant pain: Secondary | ICD-10-CM | POA: Diagnosis not present

## 2015-06-14 DIAGNOSIS — R4182 Altered mental status, unspecified: Secondary | ICD-10-CM | POA: Diagnosis present

## 2015-06-14 DIAGNOSIS — E039 Hypothyroidism, unspecified: Secondary | ICD-10-CM | POA: Diagnosis present

## 2015-06-14 DIAGNOSIS — Z79899 Other long term (current) drug therapy: Secondary | ICD-10-CM | POA: Diagnosis not present

## 2015-06-14 DIAGNOSIS — Z8249 Family history of ischemic heart disease and other diseases of the circulatory system: Secondary | ICD-10-CM

## 2015-06-14 DIAGNOSIS — Z96611 Presence of right artificial shoulder joint: Secondary | ICD-10-CM | POA: Diagnosis present

## 2015-06-14 DIAGNOSIS — I10 Essential (primary) hypertension: Secondary | ICD-10-CM | POA: Diagnosis present

## 2015-06-14 DIAGNOSIS — D696 Thrombocytopenia, unspecified: Secondary | ICD-10-CM | POA: Diagnosis present

## 2015-06-14 DIAGNOSIS — R768 Other specified abnormal immunological findings in serum: Secondary | ICD-10-CM | POA: Diagnosis present

## 2015-06-14 DIAGNOSIS — K7030 Alcoholic cirrhosis of liver without ascites: Secondary | ICD-10-CM | POA: Diagnosis not present

## 2015-06-14 HISTORY — DX: Sepsis, unspecified organism: A41.9

## 2015-06-14 LAB — I-STAT TROPONIN, ED: Troponin i, poc: 0.04 ng/mL (ref 0.00–0.08)

## 2015-06-14 LAB — PROTIME-INR
INR: 1.31 (ref 0.00–1.49)
Prothrombin Time: 16.4 seconds — ABNORMAL HIGH (ref 11.6–15.2)

## 2015-06-14 LAB — COMPREHENSIVE METABOLIC PANEL
ALT: 35 U/L (ref 14–54)
AST: 69 U/L — AB (ref 15–41)
Albumin: 3.5 g/dL (ref 3.5–5.0)
Alkaline Phosphatase: 112 U/L (ref 38–126)
Anion gap: 15 (ref 5–15)
BUN: 13 mg/dL (ref 6–20)
CHLORIDE: 96 mmol/L — AB (ref 101–111)
CO2: 23 mmol/L (ref 22–32)
CREATININE: 5.17 mg/dL — AB (ref 0.44–1.00)
Calcium: 10.7 mg/dL — ABNORMAL HIGH (ref 8.9–10.3)
GFR calc non Af Amer: 8 mL/min — ABNORMAL LOW (ref >60)
GFR, EST AFRICAN AMERICAN: 9 mL/min — AB (ref >60)
Glucose, Bld: 103 mg/dL — ABNORMAL HIGH (ref 65–99)
Potassium: 4 mmol/L (ref 3.5–5.1)
SODIUM: 134 mmol/L — AB (ref 135–145)
Total Bilirubin: 1 mg/dL (ref 0.3–1.2)
Total Protein: 9.1 g/dL — ABNORMAL HIGH (ref 6.5–8.1)

## 2015-06-14 LAB — I-STAT CG4 LACTIC ACID, ED
LACTIC ACID, VENOUS: 3.89 mmol/L — AB (ref 0.5–2.0)
Lactic Acid, Venous: 3.51 mmol/L (ref 0.5–2.0)

## 2015-06-14 LAB — CBC
HEMATOCRIT: 36.2 % (ref 36.0–46.0)
Hemoglobin: 12.4 g/dL (ref 12.0–15.0)
MCH: 31.7 pg (ref 26.0–34.0)
MCHC: 34.3 g/dL (ref 30.0–36.0)
MCV: 92.6 fL (ref 78.0–100.0)
PLATELETS: 89 10*3/uL — AB (ref 150–400)
RBC: 3.91 MIL/uL (ref 3.87–5.11)
RDW: 16.2 % — ABNORMAL HIGH (ref 11.5–15.5)
WBC: 2.4 10*3/uL — ABNORMAL LOW (ref 4.0–10.5)

## 2015-06-14 LAB — DIFFERENTIAL
BASOS ABS: 0 10*3/uL (ref 0.0–0.1)
Basophils Relative: 2 %
EOS ABS: 0.3 10*3/uL (ref 0.0–0.7)
Eosinophils Relative: 10 %
LYMPHS ABS: 0.7 10*3/uL (ref 0.7–4.0)
LYMPHS PCT: 28 %
MONOS PCT: 8 %
Monocytes Absolute: 0.2 10*3/uL (ref 0.1–1.0)
NEUTROS ABS: 1.4 10*3/uL — AB (ref 1.7–7.7)
Neutrophils Relative %: 53 %

## 2015-06-14 LAB — AMMONIA: Ammonia: 73 umol/L — ABNORMAL HIGH (ref 9–35)

## 2015-06-14 LAB — POC OCCULT BLOOD, ED: Fecal Occult Bld: POSITIVE — AB

## 2015-06-14 MED ORDER — PANTOPRAZOLE SODIUM 40 MG IV SOLR
80.0000 mg | Freq: Once | INTRAVENOUS | Status: AC
  Administered 2015-06-14: 80 mg via INTRAVENOUS
  Filled 2015-06-14: qty 80

## 2015-06-14 MED ORDER — LACTULOSE 10 GM/15ML PO SOLN
30.0000 g | Freq: Once | ORAL | Status: DC
  Filled 2015-06-14: qty 45

## 2015-06-14 MED ORDER — VANCOMYCIN HCL 10 G IV SOLR
1500.0000 mg | Freq: Once | INTRAVENOUS | Status: AC
  Administered 2015-06-14: 1500 mg via INTRAVENOUS
  Filled 2015-06-14: qty 1500

## 2015-06-14 MED ORDER — MORPHINE SULFATE (PF) 2 MG/ML IV SOLN: 1.0000 mg | INTRAVENOUS | Status: DC | PRN

## 2015-06-14 MED ORDER — LEVOTHYROXINE SODIUM 50 MCG PO TABS
50.0000 ug | ORAL_TABLET | Freq: Every day | ORAL | Status: DC
  Administered 2015-06-15 – 2015-06-19 (×4): 50 ug via ORAL
  Filled 2015-06-14 (×4): qty 1

## 2015-06-14 MED ORDER — PIPERACILLIN-TAZOBACTAM 3.375 G IVPB 30 MIN
3.3750 g | Freq: Once | INTRAVENOUS | Status: AC
  Administered 2015-06-14: 3.375 g via INTRAVENOUS
  Filled 2015-06-14: qty 50

## 2015-06-14 MED ORDER — SODIUM CHLORIDE 0.9 % IV BOLUS (SEPSIS): 500.0000 mL | INTRAVENOUS | Status: DC

## 2015-06-14 MED ORDER — ASPIRIN 325 MG PO TABS: 325.0000 mg | ORAL_TABLET | Freq: Four times a day (QID) | ORAL | Status: DC | PRN

## 2015-06-14 MED ORDER — VANCOMYCIN HCL IN DEXTROSE 1-5 GM/200ML-% IV SOLN: 1000.0000 mg | Freq: Once | INTRAVENOUS | Status: DC

## 2015-06-14 MED ORDER — SODIUM CHLORIDE 0.9 % IV SOLN
INTRAVENOUS | Status: DC
  Administered 2015-06-14: via INTRAVENOUS

## 2015-06-14 MED ORDER — SODIUM CHLORIDE 0.9 % IV BOLUS (SEPSIS)
500.0000 mL | INTRAVENOUS | Status: AC
  Administered 2015-06-14: 500 mL via INTRAVENOUS

## 2015-06-14 MED ORDER — SODIUM CHLORIDE 0.9 % IV BOLUS (SEPSIS)
1000.0000 mL | INTRAVENOUS | Status: AC
  Administered 2015-06-14: 1000 mL via INTRAVENOUS

## 2015-06-14 MED ORDER — LACTULOSE 10 GM/15ML PO SOLN
30.0000 g | Freq: Three times a day (TID) | ORAL | Status: DC
  Administered 2015-06-14 – 2015-06-17 (×7): 30 g via ORAL
  Filled 2015-06-14 (×6): qty 45

## 2015-06-14 MED ORDER — HYDROXYZINE HCL 50 MG/ML IM SOLN: 25.0000 mg | Freq: Four times a day (QID) | INTRAMUSCULAR | Status: DC | PRN

## 2015-06-14 MED ORDER — METOPROLOL TARTRATE 50 MG PO TABS
50.0000 mg | ORAL_TABLET | Freq: Two times a day (BID) | ORAL | Status: DC
  Administered 2015-06-14 – 2015-06-15 (×3): 50 mg via ORAL
  Filled 2015-06-14 (×3): qty 1

## 2015-06-14 MED ORDER — SODIUM CHLORIDE 0.9 % IV BOLUS (SEPSIS)
500.0000 mL | Freq: Once | INTRAVENOUS | Status: AC
  Administered 2015-06-14: 500 mL via INTRAVENOUS

## 2015-06-14 MED ORDER — PANTOPRAZOLE SODIUM 40 MG IV SOLR
40.0000 mg | Freq: Two times a day (BID) | INTRAVENOUS | Status: DC
  Administered 2015-06-15: 40 mg via INTRAVENOUS
  Filled 2015-06-14: qty 40

## 2015-06-14 MED ORDER — SODIUM CHLORIDE 0.9 % IV BOLUS (SEPSIS): 1000.0000 mL | INTRAVENOUS | Status: DC

## 2015-06-14 MED ORDER — PIPERACILLIN-TAZOBACTAM IN DEX 2-0.25 GM/50ML IV SOLN
2.2500 g | Freq: Three times a day (TID) | INTRAVENOUS | Status: DC
  Administered 2015-06-15 (×2): 2.25 g via INTRAVENOUS
  Filled 2015-06-14 (×3): qty 50

## 2015-06-14 MED ORDER — SODIUM CHLORIDE 0.9% FLUSH
3.0000 mL | Freq: Two times a day (BID) | INTRAVENOUS | Status: DC
Start: 2015-06-14 — End: 2015-06-19
  Administered 2015-06-17 – 2015-06-18 (×2): 3 mL via INTRAVENOUS

## 2015-06-14 NOTE — ED Notes (Signed)
EMT was unable to obtain urine upon in and out cathing the patient.

## 2015-06-14 NOTE — Progress Notes (Signed)
Pharmacy Code Sepsis Protocol  Time of code sepsis page: 2004  Zosyn administered at 2058  Were antibiotics ordered at the time of the code sepsis page? Yes   Was it required to contact the physician?  Physician not contacted  Physician contacted to order antibiotics for code sepsis  Physician contacted to recommend changing antibiotics  Pharmacy consulted for: Zosyn and vancomycin  Anti-infectives    Start     Dose/Rate Route Frequency Ordered Stop   06/15/15 0400  piperacillin-tazobactam (ZOSYN) IVPB 2.25 g     2.25 g 100 mL/hr over 30 Minutes Intravenous 3 times per day 06/14/15 2121     06/14/15 2000  piperacillin-tazobactam (ZOSYN) IVPB 3.375 g     3.375 g 100 mL/hr over 30 Minutes Intravenous  Once 06/14/15 1951     06/14/15 2000  vancomycin (VANCOCIN) IVPB 1000 mg/200 mL premix  Status:  Discontinued     1,000 mg 200 mL/hr over 60 Minutes Intravenous  Once 06/14/15 1951 06/14/15 1953   06/14/15 2000  vancomycin (VANCOCIN) 1,500 mg in sodium chloride 0.9 % 500 mL IVPB     1,500 mg 250 mL/hr over 120 Minutes Intravenous  Once 06/14/15 1953          Nurse education provided:  Minutes left to administer antibiotics to achieve 1 hour goal  Correct order of antibiotic administration  Antibiotic Y-site compatibilities     Armandina Stammer, PharmD 06/14/2015, 9:24 PM

## 2015-06-14 NOTE — Progress Notes (Signed)
Pharmacy Antibiotic Note  HEBA IGE is a 70 y.o. female admitted on 06/14/2015 with sepsis.  Pharmacy has been consulted for Zosyn and vancomycin dosing. One time doses ordered in the ED. Afebrile, WBC low at 2.4. SCr elevated at 5.17. Hx of ESRD on HD.  Plan: Start Zosyn 2.25g IV Q8 Follow up HD schedule for further vancomycin dosing Monitor clinical picture, renal function, VT prn F/U C&S, abx deescalation / LOT      Temp (24hrs), Avg:97.9 F (36.6 C), Min:97.9 F (36.6 C), Max:97.9 F (36.6 C)   Recent Labs Lab 06/14/15 1833 06/14/15 1916  WBC 2.4*  --   CREATININE 5.17*  --   LATICACIDVEN  --  3.51*    Estimated Creatinine Clearance: 9.2 mL/min (by C-G formula based on Cr of 5.17).    Allergies  Allergen Reactions  . Peanut Butter Flavor   . Tomato   . Banana Other (See Comments)    Due to dialysis  . Chocolate Other (See Comments)    Due to dialysis  . Fish-Derived Products Other (See Comments)    Due to gout    Antimicrobials this admission: Zosyn 2/27 >>  Vancomycin 2/27 >>   Dose adjustments this admission: n/a  Microbiology results: 2/27 BCx:  2/27 UCx:    Thank you for allowing pharmacy to be a part of this patient's care.  Armandina Stammer 06/14/2015 7:53 PM

## 2015-06-14 NOTE — ED Notes (Signed)
Pt is hard of hearing but alert and oriented at present. PT reports she has seen blood in her underwear lately. Upon obtaining a hemoccult pt had bright red blood from rectum.

## 2015-06-14 NOTE — ED Notes (Signed)
Pt sts AMS x 4 days with intermittent confusion with unsteady gait; pt alert to person and place; pt had dialysis today and having some abd pain per family; pt with strange behavior like combing hair with remote

## 2015-06-14 NOTE — ED Notes (Signed)
Patient transported to CT 

## 2015-06-14 NOTE — ED Provider Notes (Signed)
CSN: [id number]     Arrival date & time 06/14/15  1800 History   First MD Initiated Contact with Patient 06/14/15 1937     Chief Complaint  Patient presents with  . Altered Mental Status     (Consider location/radiation/quality/duration/timing/severity/associated sxs/prior Treatment) HPI  70 year old female presents with altered mental status. History is taken from granddaughter at the bedside. Apparently patient has been having waxing and waning altered mental status over the last 3 days. Started when she first woke up 3 days ago, seemed to improve somewhat when going to her typical dialysis but that has been off and on since. She will do very odd things such as using a remote control to brush her hair. The other day she started just stripping in the middle of the kitchen. Today the granddaughter thinks the patient is actually somewhat better but still confused. Has not had any fever or cough. No complaints of headache. Patient has been complaining of abdominal pain for the last 2 weeks. Patient tells me that there was blood in her underwear although she cannot only when this occurred or if it was even recently. The pain is mostly in her lower abdomen. Unclear if the patient still urinates, the patient is confused in the granddaughter does not know. Has had a prior history of a dialysis graft infection but currently no complaints of graft pain or swelling.  Past Medical History  Diagnosis Date  . Shoulder pain   . Headache(784.0)     occasionally  . Dizziness   . Occasional numbness/prickling/tingling of fingers and toes   . Arthritis     left shoulder  . Joint pain   . Joint swelling   . Gastric ulcer   . Dry skin   . Peripheral edema   . Constipation   . Oligouria   . H/O: GI bleed   . Hepatitis     Hep B  . History of kidney stones   . History of blood transfusion   . Hypertension   . Anemia   . Hypothyroidism     takes Synthroid daily  . Impaired hearing   . Diabetes  mellitus     borderline  . Renal disorder     m, w, F    Past Surgical History  Procedure Laterality Date  . Shoulder surgery  3-72yrs ago    right replacement  . Esophagogastroduodenoscopy  06/09/2011    Procedure: ESOPHAGOGASTRODUODENOSCOPY (EGD);  Surgeon: Theda Belfast, MD;  Location: [facility] ENDOSCOPY;  Service: Endoscopy;  Laterality: N/A;  . Cataract surgery      bilateral  . Reverse shoulder arthroplasty  09/22/2011    Procedure: REVERSE SHOULDER ARTHROPLASTY;  Surgeon: Verlee Rossetti, MD;  Location: [facility] OR;  Service: Orthopedics;  Laterality: Left;  left reverse shoulder arthroplasty  . Cholecystectomy  12/26/2011  . Cholecystectomy  12/26/2011    Procedure: LAPAROSCOPIC CHOLECYSTECTOMY;  Surgeon: Atilano Ina, MD,FACS;  Location: [facility] OR;  Service: General;  Laterality: N/A;  . Arteriovenous graft placement Left     forearm  . Shuntogram Left 08/29/2012    Procedure: SHUNTOGRAM;  Surgeon: Fransisco Hertz, MD;  Location: [facility] CATH LAB;  Service: Cardiovascular;  Laterality: Left;  arm  . Revision of arteriovenous goretex graft Left 01/12/2015    Procedure: REVISION OF Left FOREARM ARTERIOVENOUS GORETEX GRAFT;  Surgeon: Fransisco Hertz, MD;  Location: [facility] OR;  Service: Vascular;  Laterality: Left;  . Insertion of dialysis catheter Right 01/12/2015    Procedure:  INSERTION OF DIALYSIS CATHETER;  Surgeon: Fransisco Hertz, MD;  Location: [facility] OR;  Service: Vascular;  Laterality: Right;  . I&d extremity Left 03/23/2015    Procedure: DRAINAGE OF LEFT ARM SEROMA;  Surgeon: Fransisco Hertz, MD;  Location: [facility] OR;  Service: Vascular;  Laterality: Left;  . Ligation arteriovenous gortex graft Left 03/23/2015    Procedure: LIGATION AND EXCISION OF LEFT ARTERIOVENOUS GORTEX GRAFT;  Surgeon: Fransisco Hertz, MD;  Location: [facility] OR;  Service: Vascular;  Laterality: Left;   Family History  Problem Relation Age of Onset  . Hypertension Mother   . Diabetes Mother   . Cancer Brother    Social History  Substance Use Topics  .  Smoking status: Never Smoker   . Smokeless tobacco: Current User    Types: Chew  . Alcohol Use: No     Comment: quit 4-75yrs ago   OB History    No data available     Review of Systems  Unable to perform ROS: Mental status change      Allergies  Peanut butter flavor; Tomato; Banana; Chocolate; and Fish-derived products  Home Medications   Prior to Admission medications   Medication Sig Start Date End Date Taking? Authorizing Provider  acetaminophen (TYLENOL) 500 MG tablet Take 500 mg by mouth every 6 (six) hours as needed for moderate pain.    Historical Provider, MD  aspirin 325 MG tablet Take 325 mg by mouth daily.    Historical Provider, MD  cefTAZidime 2 g in dextrose 5 % 50 mL Inject 2 g into the vein every Monday, Wednesday, and Friday with hemodialysis. 12/28/14   Zannie Cove, MD  cinacalcet (SENSIPAR) 30 MG tablet Take 30 mg by mouth daily with supper.    Historical Provider, MD  HYDROcodone-acetaminophen (NORCO/VICODIN) 5-325 MG tablet Take 1 tablet by mouth every 6 (six) hours as needed for moderate pain. 03/23/15   Raymond Gurney, PA-C  levothyroxine (SYNTHROID, LEVOTHROID) 50 MCG tablet  04/15/15   Historical Provider, MD  lidocaine-prilocaine (EMLA) cream  03/30/15   Historical Provider, MD  metoprolol tartrate (LOPRESSOR) 25 MG tablet Take 25 mg by mouth 2 (two) times daily.    Historical Provider, MD  pantoprazole (PROTONIX) 40 MG tablet Take 40 mg by mouth daily. 05/02/14   Historical Provider, MD   BP 134/71 mmHg  Pulse 66  Temp(Src) 98.6 F (37 C) (Rectal)  Resp 18  SpO2 100% Physical Exam  Constitutional: She appears well-developed and well-nourished.  HENT:  Head: Normocephalic and atraumatic.  Right Ear: External ear normal.  Left Ear: External ear normal.  Nose: Nose normal.  Eyes: EOM are normal. Pupils are equal, round, and reactive to light. Right eye exhibits no discharge. Left eye exhibits no discharge.  Neck: Normal range of motion. Neck  supple.  Normal Passive ROM without pain. No meningismus  Cardiovascular: Normal rate, regular rhythm and normal heart sounds.   Pulmonary/Chest: Effort normal and breath sounds normal.  No tenderness over chest dialysis port  Abdominal: Soft. There is tenderness (diffuse).  Musculoskeletal:  No thrill in previous left AV fistula  Neurological: She is alert. She is disoriented.  Awake, knows name and that she's in hospital. Knows today is Monday but not the month, year or president. Equal strength in all 4 extremities. No facial droop  Skin: Skin is warm and dry.  Nursing note and vitals reviewed.   ED Course  Procedures (including critical care time) Labs Review Labs Reviewed  COMPREHENSIVE METABOLIC  PANEL - Abnormal; Notable for the following:    Sodium 134 (*)    Chloride 96 (*)    Glucose, Bld 103 (*)    Creatinine, Ser 5.17 (*)    Calcium 10.7 (*)    Total Protein 9.1 (*)    AST 69 (*)    GFR calc non Af Amer 8 (*)    GFR calc Af Amer 9 (*)    All other components within normal limits  CBC - Abnormal; Notable for the following:    WBC 2.4 (*)    RDW 16.2 (*)    Platelets 89 (*)    All other components within normal limits  DIFFERENTIAL - Abnormal; Notable for the following:    Neutro Abs 1.4 (*)    All other components within normal limits  I-STAT CG4 LACTIC ACID, ED - Abnormal; Notable for the following:    Lactic Acid, Venous 3.51 (*)    All other components within normal limits  CULTURE, BLOOD (ROUTINE X 2)  CULTURE, BLOOD (ROUTINE X 2)  URINE CULTURE  URINALYSIS, ROUTINE W REFLEX MICROSCOPIC (NOT AT [facility])  AMMONIA  PROTIME-INR  CBG MONITORING, ED  POC OCCULT BLOOD, ED  I-STAT TROPOININ, ED  I-STAT CG4 LACTIC ACID, ED    Imaging Review Ct Abdomen Pelvis Wo Contrast  06/14/2015  CLINICAL DATA:  Abdominal pain.  Rectal bleeding. EXAM: CT ABDOMEN AND PELVIS WITHOUT CONTRAST TECHNIQUE: Multidetector CT imaging of the abdomen and pelvis was performed following  the standard protocol without IV contrast. COMPARISON:  12/26/2014 FINDINGS: Lower chest: Lung bases are clear. Dialysis catheter tip in the right atrium. Mitral annular calcifications. Hepatobiliary: Liver is diffusely nodular and compatible with cirrhosis. Gallbladder has been removed. Common bile duct roughly measures 1 cm and similar to the previous examination. Pancreas: No gross abnormality to the pancreas. Spleen: Spleen is prominent and similar to the previous exam. Again noted are enlarged vascular structures around the spleen which drain into the left renal vein. Known varices around the stomach and distal esophagus are poorly characterized on this examination. Adrenals/Urinary Tract: No acute abnormality to the adrenal glands. Bilateral atrophic kidneys compatible with end-stage renal disease. Evidence for at least 2 cysts in the left kidney. No hydronephrosis. Urinary bladder is decompressed. Stomach/Bowel: Difficult to exclude wall thickening in the rectal region. There is no significant pericolonic inflammation. Mild mesenteric edema is nonspecific. No evidence for a bowel obstruction. Vascular/Lymphatic: Atherosclerotic calcifications in the aorta without aneurysm. No significant lymphadenopathy. Reproductive: Again noted is a low-density structure in the left adnexa. This structure now measures up to 3.5 cm and previously measured 2.5 cm. No gross abnormality to the uterus. Other: There is no significant free fluid or ascites. Musculoskeletal: The bony structures are very heterogeneous with patchy areas of sclerosis. Again noted is severe disc disease at L2-L3 with multiple Schmorl's nodes and vacuum phenomenon. IMPRESSION: No acute abnormality within the abdomen or pelvis. Changes compatible with end-stage renal disease. 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 Persistent left adnexal cystic structure. However, this structure may be enlarging. Consider a non-emergent pelvic  ultrasound to ensure that this has benign characteristics in a postmenopausal female. 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 Electronically Signed   By: Richarda Overlie M.D.   On: 06/14/2015 21:16   Ct Head Wo Contrast  06/14/2015  CLINICAL DATA:  Altered mental status.  Unsteady gait.  Confusion. EXAM: CT HEAD WITHOUT CONTRAST TECHNIQUE: Contiguous axial images were obtained from the base of the skull through  the vertex without intravenous contrast. COMPARISON:  None. FINDINGS: Sinuses/Soft tissues: renal osteodystrophy. Clear paranasal sinuses and mastoid air cells. Intracranial: Age advanced cerebral atrophy. Moderate low density in the periventricular white matter likely related to small vessel disease. No mass lesion, hemorrhage, hydrocephalus, acute infarct, intra-axial, or extra-axial fluid collection. IMPRESSION: 1.  No acute intracranial abnormality. 2.  Cerebral atrophy and small vessel ischemic change. Electronically Signed   By: Jeronimo Greaves M.D.   On: 06/14/2015 21:05   Dg Chest Port 1 View  06/14/2015  CLINICAL DATA:  70 year old female with altered mental status and intermittent confusion and unsteady gait EXAM: PORTABLE CHEST 1 VIEW COMPARISON:  Chest radiograph dated 01/12/2015 FINDINGS: Single-view of the chest does not demonstrate focal consolidation. There is no pleural effusion or pneumothorax. There are mild diffuse vascular interstitial prominence, likely mild congestive changes. There is stable mild cardiomegaly. Eight dwell lumen dialysis catheter is noted in stable position. The degenerative changes of the osseous structures with bilateral shoulder arthroplasties. No acute fracture. IMPRESSION: Stable appearing minimal congestive changes. No focal consolidation. Electronically Signed   By: Elgie Collard M.D.   On: 06/14/2015 20:06   I have personally reviewed and evaluated these images and lab results as part of my medical decision-making.   EKG Interpretation   Date/Time:  Monday  June 14 2015 21:29:17 EST Ventricular Rate:  78 PR Interval:  191 QRS Duration: 116 QT Interval:  455 QTC Calculation: 518 R Axis:   13 Text Interpretation:  Sinus rhythm Nonspecific intraventricular conduction  delay Low voltage, extremity leads no significant change since April 2016  Confirmed by Criss Alvine  MD, Steele Stracener ([id number]) on 06/14/2015 9:56:40 PM      MDM   Final diagnoses:  Acute encephalopathy  Lactic acidosis  ESRD on dialysis [facility])    Patient appears pleasantly confused but in no distress. Vital signs are unremarkable. However her workup started in the waiting room shows lactic acid of 3.5. Unclear exactly where this is coming from. Her abdominal pain is nonspecific and has been on and off for quite some time, has a negative CT scan. However she is having acute rectal bleeding, the exact onset is unclear. This was noted on a rectal exam for a rectal temp. Her hemoglobin is stable she does not appear to be hemorrhaging to the point of needing a blood transfusion. Her lactic acid could be difficult to clear due to a history of cirrhosis. While sepsis is a possibility, she does not have a fever or an obvious source. Her mild confusion is likely related to her elevated ammonia level from hepatic encephalopathy. She was given broad antibiotics by think the more common cause is the ammonia. No PNA. No UTI (doesn't make urine - or at least none on cath). I think meningitis is unlikely with no fevers and no meningismus on exam. Admit to the hospitalist.    Pricilla Loveless, MD 06/15/15 [phone]

## 2015-06-14 NOTE — Progress Notes (Signed)
Report received. Room ready for patient's arrival.  Feliciana Rossetti, RN, BSN

## 2015-06-14 NOTE — H&P (Addendum)
[facility] History and Physical  [name] VHQ:[id number] DOB: 19-Apr-1945 DOA: 06/14/2015  Referring physician: ED physician PCP: Dorrene German, MD  Specialists:   Chief Complaint: Altered mental status, abdominal pain, rectal bleeding  HPI: Samantha [name] is a 70 y.o. female with PMH of hypertension, hypothyroidism, ESRD-HD, headache, dizziness, occasional tingling sensation in fingers, gastric ulcer, GI bleeding, hepatitis B, diastolic congestive heart failure, chronic leukopenia, who presents with altered mental status, abdominal pain and rectal bleeding.  Per family, patient has been confused in the past 3 days. Her abdominal pain is located in the left lower quadrant. She has diarrhea and mild rectal bleeding with bright right blood in her and nowhere per her grand daughter. They cannot provide detailed inflammations. Patient also has dry cough which is some mild, no fever, chills, shortness of breath or chest pain. No runny nose or sore throat. Patient has been compliant to her dialysis, last dialysis was today. Per her granddaughter, her AVF is not functioning, but the patient refused to left surgeon to put a new AV fistular for her. She is using port-line for dialysis now. Patient does not have nausea, vomiting, diarrhea, unilateral weakness.  In ED, patient was found to have elevated ammonia and 73, positive FOBT, WBC 2.4 which was 2.5 on 12/28/14, thrombocytopenia with platelet 89, lactate 3.51--> 3.89, temperature normal, no tachycardia, creatinine 5.17, BUN 13, bicarbonate 24, normal potassium. Chest x-ray is negative for infiltration. CT head is negative for acute intracranial abnormalities. CT abdomen/pelvis showed no acute abnormality within the abdomen or pelvis; cirrhosis with evidence of portal hypertension and varices, but no significant free fluid or ascites, persistent left adnexal cystic structure. However, this structure may be enlarging. Consider a non-emergent  pelvic ultrasound to ensure that this has benign characteristics in a postmenopausal female.  EKG: Independently reviewed. QTC 518, nonspecific T-wave change, early R-wave progression, low voltage in limb leads.  Where does patient live?   At home  Can patient participate in ADLs?  None  Review of Systems: Could not be reviewed due to altered mental status.  Travel history: No recent long distant travel.  Allergy:  Allergies  Allergen Reactions  . Banana Other (See Comments)    Due to dialysis  . Chocolate Other (See Comments)    Due to dialysis  . Fish-Derived Products Other (See Comments)    Due to gout    Past Medical History  Diagnosis Date  . Shoulder pain   . Headache(784.0)     occasionally  . Dizziness   . Occasional numbness/prickling/tingling of fingers and toes   . Arthritis     left shoulder  . Joint pain   . Joint swelling   . Gastric ulcer   . Dry skin   . Peripheral edema   . Constipation   . Oligouria   . H/O: GI bleed   . Hepatitis     Hep B  . History of kidney stones   . History of blood transfusion   . Hypertension   . Anemia   . Hypothyroidism     takes Synthroid daily  . Impaired hearing   . Diabetes mellitus     borderline  . Renal disorder     m, w, F     Past Surgical History  Procedure Laterality Date  . Shoulder surgery  3-50yrs ago    right replacement  . Esophagogastroduodenoscopy  06/09/2011    Procedure: ESOPHAGOGASTRODUODENOSCOPY (EGD);  Surgeon: Theda Belfast, MD;  Location: [facility];  Service: Endoscopy;  Laterality: N/A;  . Cataract surgery      bilateral  . Reverse shoulder arthroplasty  09/22/2011    Procedure: REVERSE SHOULDER ARTHROPLASTY;  Surgeon: Verlee Rossetti, MD;  Location: [facility] OR;  Service: Orthopedics;  Laterality: Left;  left reverse shoulder arthroplasty  . Cholecystectomy  12/26/2011  . Cholecystectomy  12/26/2011    Procedure: LAPAROSCOPIC CHOLECYSTECTOMY;  Surgeon: Atilano Ina, MD,FACS;  Location:  [facility] OR;  Service: General;  Laterality: N/A;  . Arteriovenous graft placement Left     forearm  . Shuntogram Left 08/29/2012    Procedure: SHUNTOGRAM;  Surgeon: Fransisco Hertz, MD;  Location[facility];  Service: Cardiovascular;  Laterality: Left;  arm  . Revision of arteriovenous goretex graft Left 01/12/2015    Procedure: REVISION OF Left FOREARM ARTERIOVENOUS GORETEX GRAFT;  Surgeon: Fransisco Hertz, MD;  Location: [facility] OR;  Service: Vascular;  Laterality: Left;  . Insertion of dialysis catheter Right 01/12/2015    Procedure: INSERTION OF DIALYSIS CATHETER;  Surgeon: Fransisco Hertz, MD;  Location: [facility] OR;  Service: Vascular;  Laterality: Right;  . I&d extremity Left 03/23/2015    Procedure: DRAINAGE OF LEFT ARM SEROMA;  Surgeon: Fransisco Hertz, MD;  Location: [facility] OR;  Service: Vascular;  Laterality: Left;  . Ligation arteriovenous gortex graft Left 03/23/2015    Procedure: LIGATION AND EXCISION OF LEFT ARTERIOVENOUS GORTEX GRAFT;  Surgeon: Fransisco Hertz, MD;  Location: [facility] OR;  Service: Vascular;  Laterality: Left;    Social History:  reports that she has never smoked. Her smokeless tobacco use includes Chew. She reports that she does not drink alcohol or use illicit drugs.  Family History:  Family History  Problem Relation Age of Onset  . Hypertension Mother   . Diabetes Mother   . Cancer Brother      Prior to Admission medications   Medication Sig Start Date End Date Taking? Authorizing Provider  acetaminophen (TYLENOL) 500 MG tablet Take 500 mg by mouth every 6 (six) hours as needed for moderate pain.   Yes Historical Provider, MD  aspirin 325 MG tablet Take 325 mg by mouth every 6 (six) hours as needed for mild pain or moderate pain.    Yes Historical Provider, MD  cefTAZidime 2 g in dextrose 5 % 50 mL Inject 2 g into the vein every Monday, Wednesday, and Friday with hemodialysis. 12/28/14  Yes Zannie Cove, MD  levothyroxine (SYNTHROID, LEVOTHROID) 50 MCG tablet Take 50 mcg by mouth daily before  breakfast.  04/15/15  Yes Historical Provider, MD  metoprolol (LOPRESSOR) 50 MG tablet Take 50 mg by mouth 2 (two) times daily.   Yes Historical Provider, MD  pantoprazole (PROTONIX) 40 MG tablet Take 40 mg by mouth daily. 05/02/14  Yes Historical Provider, MD  HYDROcodone-acetaminophen (NORCO/VICODIN) 5-325 MG tablet Take 1 tablet by mouth every 6 (six) hours as needed for moderate pain. Patient not taking: Reported on 06/14/2015 03/23/15   Raymond Gurney, PA-C    Physical Exam: Filed Vitals:   06/14/15 2130 06/14/15 2145 06/14/15 2300 06/15/15 0430  BP: 128/66 134/74 149/77 106/70  Pulse: 77 80 78 64  Temp:   98.2 F (36.8 C) 97.9 F (36.6 C)  TempSrc:   Oral Oral  Resp: 14 13 16 18   Weight:      SpO2: 99% 100% 97% 100%   General: Not in acute distress HEENT:       Eyes: PERRL, EOMI, no  scleral icterus.       ENT: No discharge from the ears and nose, no pharynx injection, no tonsillar enlargement.        Neck: No JVD, no bruit, no mass felt. Heme: No neck lymph node enlargement. Cardiac: S1/S2, RRR, No murmurs, No gallops or rubs. Has Port-A-Cath over right upper chest with clean surroundings. Pulm: No rales, wheezing, rhonchi or rubs. Abd: Soft, nondistended, has tenderness over LLQ, no rebound pain, no organomegaly, BS present. Ext: No pitting leg edema bilaterally. 2+DP/PT pulse bilaterally. Musculoskeletal: No joint deformities, No joint redness or warmth, no limitation of ROM in spin. Skin: No rashes.  Neuro: confused, not oriented X3, cranial nerves II-XII grossly intact, moves all extremities. Psych: Patient is not psychotic, no suicidal or hemocidal ideation.  Labs on Admission:  Basic Metabolic Panel:  Recent Labs Lab 06/14/15 1833 06/15/15 0400  NA 134* 138  K 4.0 3.9  CL 96* 102  CO2 23 20*  GLUCOSE 103* 75  BUN 13 19  CREATININE 5.17* 6.28*  CALCIUM 10.7* 9.7   Liver Function Tests:  Recent Labs Lab 06/14/15 1833 06/15/15 0400  AST 69* 78*  ALT  35 39  ALKPHOS 112 95  BILITOT 1.0 1.2  PROT 9.1* 7.7  ALBUMIN 3.5 3.1*   No results for input(s): LIPASE, AMYLASE in the last 168 hours.  Recent Labs Lab 06/14/15 2051  AMMONIA 73*   CBC:  Recent Labs Lab 06/14/15 1833 06/15/15 0030 06/15/15 0400  WBC 2.4* 2.7* 2.4*  NEUTROABS 1.4*  --   --   HGB 12.4 11.8* 11.4*  HCT 36.2 33.9* 34.0*  MCV 92.6 93.6 94.7  PLT 89* 74* PENDING   Cardiac Enzymes: No results for input(s): CKTOTAL, CKMB, CKMBINDEX, TROPONINI in the last 168 hours.  BNP (last 3 results) No results for input(s): BNP in the last 8760 hours.  ProBNP (last 3 results) No results for input(s): PROBNP in the last 8760 hours.  CBG: No results for input(s): GLUCAP in the last 168 hours.  Radiological Exams on Admission: Ct Abdomen Pelvis Wo Contrast  06/14/2015  CLINICAL DATA:  Abdominal pain.  Rectal bleeding. EXAM: CT ABDOMEN AND PELVIS WITHOUT CONTRAST TECHNIQUE: Multidetector CT imaging of the abdomen and pelvis was performed following the standard protocol without IV contrast. COMPARISON:  12/26/2014 FINDINGS: Lower chest: Lung bases are clear. Dialysis catheter tip in the right atrium. Mitral annular calcifications. Hepatobiliary: Liver is diffusely nodular and compatible with cirrhosis. Gallbladder has been removed. Common bile duct roughly measures 1 cm and similar to the previous examination. Pancreas: No gross abnormality to the pancreas. Spleen: Spleen is prominent and similar to the previous exam. Again noted are enlarged vascular structures around the spleen which drain into the left renal vein. Known varices around the stomach and distal esophagus are poorly characterized on this examination. Adrenals/Urinary Tract: No acute abnormality to the adrenal glands. Bilateral atrophic kidneys compatible with end-stage renal disease. Evidence for at least 2 cysts in the left kidney. No hydronephrosis. Urinary bladder is decompressed. Stomach/Bowel: Difficult to  exclude wall thickening in the rectal region. There is no significant pericolonic inflammation. Mild mesenteric edema is nonspecific. No evidence for a bowel obstruction. Vascular/Lymphatic: Atherosclerotic calcifications in the aorta without aneurysm. No significant lymphadenopathy. Reproductive: Again noted is a low-density structure in the left adnexa. This structure now measures up to 3.5 cm and previously measured 2.5 cm. No gross abnormality to the uterus. Other: There is no significant free fluid or ascites. Musculoskeletal: The bony  structures are very heterogeneous with patchy areas of sclerosis. Again noted is severe disc disease at L2-L3 with multiple Schmorl's nodes and vacuum phenomenon. IMPRESSION: No acute abnormality within the abdomen or pelvis. Changes compatible with end-stage renal disease. 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 Persistent left adnexal cystic structure. However, this structure may be enlarging. Consider a non-emergent pelvic ultrasound to ensure that this has benign characteristics in a postmenopausal female. 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 Electronically Signed   By: Richarda Overlie M.D.   On: 06/14/2015 21:16   Ct Head Wo Contrast  06/14/2015  CLINICAL DATA:  Altered mental status.  Unsteady gait.  Confusion. EXAM: CT HEAD WITHOUT CONTRAST TECHNIQUE: Contiguous axial images were obtained from the base of the skull through the vertex without intravenous contrast. COMPARISON:  None. FINDINGS: Sinuses/Soft tissues: renal osteodystrophy. Clear paranasal sinuses and mastoid air cells. Intracranial: Age advanced cerebral atrophy. Moderate low density in the periventricular white matter likely related to small vessel disease. No mass lesion, hemorrhage, hydrocephalus, acute infarct, intra-axial, or extra-axial fluid collection. IMPRESSION: 1.  No acute intracranial abnormality. 2.  Cerebral atrophy and small vessel ischemic change. Electronically Signed   By: Jeronimo Greaves M.D.   On: 06/14/2015 21:05   Dg Chest Port 1 View  06/14/2015  CLINICAL DATA:  70 year old female with altered mental status and intermittent confusion and unsteady gait EXAM: PORTABLE CHEST 1 VIEW COMPARISON:  Chest radiograph dated 01/12/2015 FINDINGS: Single-view of the chest does not demonstrate focal consolidation. There is no pleural effusion or pneumothorax. There are mild diffuse vascular interstitial prominence, likely mild congestive changes. There is stable mild cardiomegaly. Eight dwell lumen dialysis catheter is noted in stable position. The degenerative changes of the osseous structures with bilateral shoulder arthroplasties. No acute fracture. IMPRESSION: Stable appearing minimal congestive changes. No focal consolidation. Electronically Signed   By: Elgie Collard M.D.   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 CT head is negative. Potential differential diagnosis is hepatic encephalopathy given history of cirrhosis and elevated ammonia level. Patient meets criteria for sepsis with leukopenia and elevated lactate, which may have also contributed. Urine sample could not be obtained. Source of infection is not clear. Given her abdominal pain, may be related to intracranial abdominal infection. Pt seems to be on chronic IV ceftazidime since September 2016 for unclear reason. This needs to be clarified in AM by calling her dialysis center. CT-abdomen/pelvis did not show significant ascites, too difficult to do paracentesis. If pt is really on ceftazidime, she will be less likely to have  SBP. Lactate 3.51--> 3.89--> 1.7. Hemodynamically stable.  -will admit to tele bed -IV vancomycin and Zosyn empirically -Lactulose 30 g 3 times a day for possible hepatic encephalopathy -will get Procalcitonin and trend lactic acid levels per sepsis protocol. -IVF: 1.5L of NS bolus in ED, followed by 75 cc/h (patient has congestive heart failure, limiting aggressive IV fluids treatment). -Follow-up blood culture, urine culture and urinalysis -Frequent neuro check  Sepsis:  -as above  GIB: Hemoglobin 13.9 on 03/23/15-->12.4. Hemodynamically stable.  Patient had EGD on 06/09/11 by Dr. Elnoria Howard, which showed medium-sized esophageal varices without stigmata of bleeding, antral, pyloric and duodenal ulcers, portal hypertensive gastropathy.  - NPO - NS at 150 mL/hr - IV pantoprazole - hold ASA - Avoid NSAIDs and SQ heparin - Monitor closely and follow q6h cbc, transfuse as necessary. - INR/PTT/type & screen - please call GI in AM.  ESRD on hemodialysis (MWF): Creatinine 5.17, BUN 13, bicarbonate 23, potassium normal. -Left message to renal for dialysis  Hypertension: -continue metoprolol  Thrombocytopenia (HCC): Likely due to cirrhosis. Platelet 89 on admission, does not need transfusion. -Follow-up by CBC  Neutropenia: Baseline WBC 2 to 3. Etiology is not clear. -May give hematology referral  DVT ppx: SCD  Code Status: DNR Family Communication: Yes, patient's grandson and granddaughter at bed side Disposition Plan: Admit to inpatient   Date of Service 06/15/2015    Lorretta Harp [facility] Pager [phone]  If 7PM-7AM, please contact night-coverage [email or web address] Password [email or web address] 06/15/2015, 5:43 AM

## 2015-06-14 NOTE — Progress Notes (Signed)
Admission note:  Arrival Method: Pt arrived on stretcher from ED Mental Orientation: Alert and oriented x 4. Pt is hard of hearing Telemetry: Tele box 6e14 applied. CCMT notified.  Assessment: Completed Skin: Dry and intact. Skin assessed with Rivka Barbara, RN IV: Right AC. Infusing. Site is clean,dry and intact.  Pain: Pt states no pain at this time Tubes: left lower arm fistula. Right chest HD catheter.  Safety Measures: Non-slip socks placed, bed in lowest position, call light within reach, bed alarm activated Fall Prevention Safety Plan: Reviewed with patient and patient family Admission Screening: In progress. Pt is unable to answer some questions due to severe hearing loss. Will wait for family to arrive in the am to finish 6700 Orientation: Patient has been oriented to the unit, staff and to the room. Patient is lying comfortably in bed with no needs stated at this time. Orders have been reviewed and implemented. Call light is within reach and bed alarm is activated. Will continue to monitor the patient closely.  Feliciana Rossetti, RN, BSN

## 2015-06-15 ENCOUNTER — Inpatient Hospital Stay (HOSPITAL_COMMUNITY): Payer: Medicare Other

## 2015-06-15 DIAGNOSIS — D709 Neutropenia, unspecified: Secondary | ICD-10-CM | POA: Diagnosis present

## 2015-06-15 LAB — TYPE AND SCREEN
ABO/RH(D): A POS
ANTIBODY SCREEN: NEGATIVE

## 2015-06-15 LAB — COMPREHENSIVE METABOLIC PANEL
ALK PHOS: 95 U/L (ref 38–126)
ALT: 39 U/L (ref 14–54)
ANION GAP: 16 — AB (ref 5–15)
AST: 78 U/L — ABNORMAL HIGH (ref 15–41)
Albumin: 3.1 g/dL — ABNORMAL LOW (ref 3.5–5.0)
BILIRUBIN TOTAL: 1.2 mg/dL (ref 0.3–1.2)
BUN: 19 mg/dL (ref 6–20)
CALCIUM: 9.7 mg/dL (ref 8.9–10.3)
CO2: 20 mmol/L — AB (ref 22–32)
CREATININE: 6.28 mg/dL — AB (ref 0.44–1.00)
Chloride: 102 mmol/L (ref 101–111)
GFR, EST AFRICAN AMERICAN: 7 mL/min — AB (ref >60)
GFR, EST NON AFRICAN AMERICAN: 6 mL/min — AB (ref >60)
Glucose, Bld: 75 mg/dL (ref 65–99)
Potassium: 3.9 mmol/L (ref 3.5–5.1)
SODIUM: 138 mmol/L (ref 135–145)
TOTAL PROTEIN: 7.7 g/dL (ref 6.5–8.1)

## 2015-06-15 LAB — CBC
HCT: 34 % — ABNORMAL LOW (ref 36.0–46.0)
HCT: 32.1 % — ABNORMAL LOW (ref 36.0–46.0)
HEMATOCRIT: 30.2 % — AB (ref 36.0–46.0)
HEMATOCRIT: 33.9 % — AB (ref 36.0–46.0)
HEMATOCRIT: 36.6 % (ref 36.0–46.0)
HEMOGLOBIN: 11.8 g/dL — AB (ref 12.0–15.0)
Hemoglobin: 11.4 g/dL — ABNORMAL LOW (ref 12.0–15.0)
Hemoglobin: 10.1 g/dL — ABNORMAL LOW (ref 12.0–15.0)
Hemoglobin: 10.7 g/dL — ABNORMAL LOW (ref 12.0–15.0)
Hemoglobin: 12 g/dL (ref 12.0–15.0)
MCH: 31.3 pg (ref 26.0–34.0)
MCH: 31.3 pg (ref 26.0–34.0)
MCH: 31.8 pg (ref 26.0–34.0)
MCH: 31.8 pg (ref 26.0–34.0)
MCH: 32.6 pg (ref 26.0–34.0)
MCHC: 33.3 g/dL (ref 30.0–36.0)
MCHC: 33.4 g/dL (ref 30.0–36.0)
MCHC: 33.5 g/dL (ref 30.0–36.0)
MCHC: 34.8 g/dL (ref 30.0–36.0)
MCHC: 32.8 g/dL (ref 30.0–36.0)
MCV: 95 fL (ref 78.0–100.0)
MCV: 93.9 fL (ref 78.0–100.0)
MCV: 93.6 fL (ref 78.0–100.0)
MCV: 94.7 fL (ref 78.0–100.0)
MCV: 95.6 fL (ref 78.0–100.0)
PLATELETS: 79 10*3/uL — AB (ref 150–400)
PLATELETS: 70 10*3/uL — AB (ref 150–400)
Platelets: 74 10*3/uL — ABNORMAL LOW (ref 150–400)
Platelets: 63 10*3/uL — ABNORMAL LOW (ref 150–400)
Platelets: 73 10*3/uL — ABNORMAL LOW (ref 150–400)
RBC: 3.18 MIL/uL — ABNORMAL LOW (ref 3.87–5.11)
RBC: 3.42 MIL/uL — AB (ref 3.87–5.11)
RBC: 3.59 MIL/uL — AB (ref 3.87–5.11)
RBC: 3.62 MIL/uL — ABNORMAL LOW (ref 3.87–5.11)
RBC: 3.83 MIL/uL — AB (ref 3.87–5.11)
RDW: 16.3 % — AB (ref 11.5–15.5)
RDW: 16.3 % — ABNORMAL HIGH (ref 11.5–15.5)
RDW: 16.3 % — ABNORMAL HIGH (ref 11.5–15.5)
RDW: 16.4 % — AB (ref 11.5–15.5)
RDW: 16.4 % — ABNORMAL HIGH (ref 11.5–15.5)
WBC: 2.3 10*3/uL — ABNORMAL LOW (ref 4.0–10.5)
WBC: 2.4 10*3/uL — ABNORMAL LOW (ref 4.0–10.5)
WBC: 2.5 10*3/uL — AB (ref 4.0–10.5)
WBC: 2.5 10*3/uL — AB (ref 4.0–10.5)
WBC: 2.7 10*3/uL — ABNORMAL LOW (ref 4.0–10.5)

## 2015-06-15 LAB — APTT: APTT: 37 s (ref 24–37)

## 2015-06-15 LAB — PROCALCITONIN: Procalcitonin: 0.36 ng/mL

## 2015-06-15 LAB — LACTIC ACID, PLASMA
LACTIC ACID, VENOUS: 2.4 mmol/L — AB (ref 0.5–2.0)
LACTIC ACID, VENOUS: 1.7 mmol/L (ref 0.5–2.0)
LACTIC ACID, VENOUS: 1.7 mmol/L (ref 0.5–2.0)
Lactic Acid, Venous: 1.2 mmol/L (ref 0.5–2.0)

## 2015-06-15 LAB — TSH: TSH: 5.001 u[IU]/mL — ABNORMAL HIGH (ref 0.350–4.500)

## 2015-06-15 LAB — GLUCOSE, CAPILLARY: GLUCOSE-CAPILLARY: 76 mg/dL (ref 65–99)

## 2015-06-15 LAB — MRSA PCR SCREENING: MRSA by PCR: NEGATIVE

## 2015-06-15 MED ORDER — SODIUM CHLORIDE 0.9 % IV SOLN
62.5000 mg | INTRAVENOUS | Status: DC
  Administered 2015-06-18: 62.5 mg via INTRAVENOUS
  Filled 2015-06-15 (×2): qty 5

## 2015-06-15 MED ORDER — DOXERCALCIFEROL 4 MCG/2ML IV SOLN
3.0000 ug | INTRAVENOUS | Status: DC
  Administered 2015-06-16 – 2015-06-18 (×2): 3 ug via INTRAVENOUS
  Filled 2015-06-15: qty 2

## 2015-06-15 MED ORDER — PANTOPRAZOLE SODIUM 40 MG PO TBEC
80.0000 mg | DELAYED_RELEASE_TABLET | Freq: Once | ORAL | Status: AC
  Administered 2015-06-15: 80 mg via ORAL
  Filled 2015-06-15: qty 2

## 2015-06-15 MED ORDER — RENA-VITE PO TABS
1.0000 | ORAL_TABLET | Freq: Every day | ORAL | Status: DC
  Administered 2015-06-15 – 2015-06-18 (×4): 1 via ORAL
  Filled 2015-06-15 (×4): qty 1

## 2015-06-15 NOTE — Consult Note (Signed)
Reason for Consult: Hematochezia and anemia Referring Physician: [facility]  Samantha [name] HPI: This is a 70 year old female with a PMH of cirrhosis (? From ETOH abuse), PRIOR history of HBV, ESRD of unknown etiology on HD MWF, and gastric and duodenal ulced 05/2011 admitted for diarrhea, hematochezia, confusion, and abdominal pain.  The patient has a history of abdominal pain and the work up in the past and currently were negative.  Her EGD in 2013 revealed medium-sized esophageal varices and portal HTN gastropathy.  Her HGB has dropped down since admission.  Per her report, but she has some confusion, she denies any further hematochezia, diarrhea, or abdominal pain.  Currently she is undergoing an ultrasound and there is tenderness in the RLQ.  Past Medical History  Diagnosis Date  . Shoulder pain   . Headache(784.0)     occasionally  . Dizziness   . Occasional numbness/prickling/tingling of fingers and toes   . Arthritis     left shoulder  . Joint pain   . Joint swelling   . Gastric ulcer   . Dry skin   . Peripheral edema   . Constipation   . Oligouria   . H/O: GI bleed   . Hepatitis     Hep B  . History of kidney stones   . History of blood transfusion   . Hypertension   . Anemia   . Hypothyroidism     takes Synthroid daily  . Impaired hearing   . Diabetes mellitus     borderline  . Renal disorder     m, w, F     Past Surgical History  Procedure Laterality Date  . Shoulder surgery  3-57yrs ago    right replacement  . Esophagogastroduodenoscopy  06/09/2011    Procedure: ESOPHAGOGASTRODUODENOSCOPY (EGD);  Surgeon: Theda Belfast, MD;  Location: [facility] ENDOSCOPY;  Service: Endoscopy;  Laterality: N/A;  . Cataract surgery      bilateral  . Reverse shoulder arthroplasty  09/22/2011    Procedure: REVERSE SHOULDER ARTHROPLASTY;  Surgeon: Verlee Rossetti, MD;  Location: [facility] OR;  Service: Orthopedics;  Laterality: Left;  left reverse shoulder arthroplasty  . Cholecystectomy   12/26/2011  . Cholecystectomy  12/26/2011    Procedure: LAPAROSCOPIC CHOLECYSTECTOMY;  Surgeon: Atilano Ina, MD,FACS;  Location: [facility] OR;  Service: General;  Laterality: N/A;  . Arteriovenous graft placement Left     forearm  . Shuntogram Left 08/29/2012    Procedure: SHUNTOGRAM;  Surgeon: Fransisco Hertz, MD;  Location: [facility] CATH LAB;  Service: Cardiovascular;  Laterality: Left;  arm  . Revision of arteriovenous goretex graft Left 01/12/2015    Procedure: REVISION OF Left FOREARM ARTERIOVENOUS GORETEX GRAFT;  Surgeon: Fransisco Hertz, MD;  Location: [facility] OR;  Service: Vascular;  Laterality: Left;  . Insertion of dialysis catheter Right 01/12/2015    Procedure: INSERTION OF DIALYSIS CATHETER;  Surgeon: Fransisco Hertz, MD;  Location: [facility] OR;  Service: Vascular;  Laterality: Right;  . I&d extremity Left 03/23/2015    Procedure: DRAINAGE OF LEFT ARM SEROMA;  Surgeon: Fransisco Hertz, MD;  Location: [facility] OR;  Service: Vascular;  Laterality: Left;  . Ligation arteriovenous gortex graft Left 03/23/2015    Procedure: LIGATION AND EXCISION OF LEFT ARTERIOVENOUS GORTEX GRAFT;  Surgeon: Fransisco Hertz, MD;  Location: [facility] OR;  Service: Vascular;  Laterality: Left;    Family History  Problem Relation Age of Onset  . Hypertension Mother   . Diabetes Mother   .  Cancer Brother     Social History:  reports that she has never smoked. Her smokeless tobacco use includes Chew. She reports that she does not drink alcohol or use illicit drugs.  Allergies:  Allergies  Allergen Reactions  . Banana Other (See Comments)    Due to dialysis  . Chocolate Other (See Comments)    Due to dialysis  . Fish-Derived Products Other (See Comments)    Due to gout    Medications:  Scheduled: . [START ON 06/16/2015] doxercalciferol  3 mcg Intravenous Q M,W,F-HD  . [START ON 06/18/2015] ferric gluconate (FERRLECIT/NULECIT) IV  62.5 mg Intravenous Q Fri-HD  . lactulose  30 g Oral TID  . levothyroxine  50 mcg Oral QAC breakfast  . metoprolol  50  mg Oral BID  . multivitamin  1 tablet Oral QHS  . pantoprazole (PROTONIX) IV  40 mg Intravenous Q12H  . sodium chloride flush  3 mL Intravenous Q12H   Continuous:   Results for orders placed or performed during the hospital encounter of 06/14/15 (from the past 24 hour(s))  Comprehensive metabolic panel     Status: Abnormal   Collection Time: 06/14/15  6:33 PM  Result Value Ref Range   Sodium 134 (L) 135 - 145 mmol/L   Potassium 4.0 3.5 - 5.1 mmol/L   Chloride 96 (L) 101 - 111 mmol/L   CO2 23 22 - 32 mmol/L   Glucose, Bld 103 (H) 65 - 99 mg/dL   BUN 13 6 - 20 mg/dL   Creatinine, Ser 1.61 (H) 0.44 - 1.00 mg/dL   Calcium 09.6 (H) 8.9 - 10.3 mg/dL   Total Protein 9.1 (H) 6.5 - 8.1 g/dL   Albumin 3.5 3.5 - 5.0 g/dL   AST 69 (H) 15 - 41 U/L   ALT 35 14 - 54 U/L   Alkaline Phosphatase 112 38 - 126 U/L   Total Bilirubin 1.0 0.3 - 1.2 mg/dL   GFR calc non Af Amer 8 (L) >60 mL/min   GFR calc Af Amer 9 (L) >60 mL/min   Anion gap 15 5 - 15  CBC     Status: Abnormal   Collection Time: 06/14/15  6:33 PM  Result Value Ref Range   WBC 2.4 (L) 4.0 - 10.5 K/uL   RBC 3.91 3.87 - 5.11 MIL/uL   Hemoglobin 12.4 12.0 - 15.0 g/dL   HCT 04.5 40.9 - 81.1 %   MCV 92.6 78.0 - 100.0 fL   MCH 31.7 26.0 - 34.0 pg   MCHC 34.3 30.0 - 36.0 g/dL   RDW 91.4 (H) 78.2 - 95.6 %   Platelets 89 (L) 150 - 400 K/uL  Differential     Status: Abnormal   Collection Time: 06/14/15  6:33 PM  Result Value Ref Range   Neutrophils Relative % 53 %   Neutro Abs 1.4 (L) 1.7 - 7.7 K/uL   Lymphocytes Relative 28 %   Lymphs Abs 0.7 0.7 - 4.0 K/uL   Monocytes Relative 8 %   Monocytes Absolute 0.2 0.1 - 1.0 K/uL   Eosinophils Relative 10 %   Eosinophils Absolute 0.3 0.0 - 0.7 K/uL   Basophils Relative 2 %   Basophils Absolute 0.0 0.0 - 0.1 K/uL  I-Stat CG4 Lactic Acid, ED     Status: Abnormal   Collection Time: 06/14/15  7:16 PM  Result Value Ref Range   Lactic Acid, Venous 3.51 (HH) 0.5 - 2.0 mmol/L   Comment  NOTIFIED PHYSICIAN   Protime-INR  Status: Abnormal   Collection Time: 06/14/15  8:40 PM  Result Value Ref Range   Prothrombin Time 16.4 (H) 11.6 - 15.2 seconds   INR 1.31 0.00 - 1.49  I-stat troponin, ED     Status: None   Collection Time: 06/14/15  8:41 PM  Result Value Ref Range   Troponin i, poc 0.04 0.00 - 0.08 ng/mL   Comment 3          POC occult blood, ED RN will collect     Status: Abnormal   Collection Time: 06/14/15  8:43 PM  Result Value Ref Range   Fecal Occult Bld POSITIVE (A) NEGATIVE  Ammonia     Status: Abnormal   Collection Time: 06/14/15  8:51 PM  Result Value Ref Range   Ammonia 73 (H) 9 - 35 umol/L  I-Stat CG4 Lactic Acid, ED     Status: Abnormal   Collection Time: 06/14/15  8:56 PM  Result Value Ref Range   Lactic Acid, Venous 3.89 (HH) 0.5 - 2.0 mmol/L   Comment NOTIFIED PHYSICIAN   MRSA PCR Screening     Status: None   Collection Time: 06/14/15 11:29 PM  Result Value Ref Range   MRSA by PCR NEGATIVE NEGATIVE  CBC     Status: Abnormal   Collection Time: 06/15/15 12:30 AM  Result Value Ref Range   WBC 2.7 (L) 4.0 - 10.5 K/uL   RBC 3.62 (L) 3.87 - 5.11 MIL/uL   Hemoglobin 11.8 (L) 12.0 - 15.0 g/dL   HCT 40.9 (L) 81.1 - 91.4 %   MCV 93.6 78.0 - 100.0 fL   MCH 32.6 26.0 - 34.0 pg   MCHC 34.8 30.0 - 36.0 g/dL   RDW 78.2 (H) 95.6 - 21.3 %   Platelets 74 (L) 150 - 400 K/uL  APTT     Status: None   Collection Time: 06/15/15 12:30 AM  Result Value Ref Range   aPTT 37 24 - 37 seconds  Procalcitonin     Status: None   Collection Time: 06/15/15 12:30 AM  Result Value Ref Range   Procalcitonin 0.36 ng/mL  Type and screen [facility]     Status: None   Collection Time: 06/15/15 12:31 AM  Result Value Ref Range   ABO/RH(D) A POS    Antibody Screen NEG    Sample Expiration 06/18/2015   Lactic acid, plasma     Status: None   Collection Time: 06/15/15 12:31 AM  Result Value Ref Range   Lactic Acid, Venous 1.7 0.5 - 2.0 mmol/L  CBC      Status: Abnormal   Collection Time: 06/15/15  4:00 AM  Result Value Ref Range   WBC 2.4 (L) 4.0 - 10.5 K/uL   RBC 3.59 (L) 3.87 - 5.11 MIL/uL   Hemoglobin 11.4 (L) 12.0 - 15.0 g/dL   HCT 08.6 (L) 57.8 - 46.9 %   MCV 94.7 78.0 - 100.0 fL   MCH 31.8 26.0 - 34.0 pg   MCHC 33.5 30.0 - 36.0 g/dL   RDW 62.9 (H) 52.8 - 41.3 %   Platelets 79 (L) 150 - 400 K/uL  Lactic acid, plasma     Status: None   Collection Time: 06/15/15  4:00 AM  Result Value Ref Range   Lactic Acid, Venous 1.7 0.5 - 2.0 mmol/L  TSH     Status: Abnormal   Collection Time: 06/15/15  4:00 AM  Result Value Ref Range   TSH 5.001 (H) 0.350 -  4.500 uIU/mL  Comprehensive metabolic panel     Status: Abnormal   Collection Time: 06/15/15  4:00 AM  Result Value Ref Range   Sodium 138 135 - 145 mmol/L   Potassium 3.9 3.5 - 5.1 mmol/L   Chloride 102 101 - 111 mmol/L   CO2 20 (L) 22 - 32 mmol/L   Glucose, Bld 75 65 - 99 mg/dL   BUN 19 6 - 20 mg/dL   Creatinine, Ser 1.61 (H) 0.44 - 1.00 mg/dL   Calcium 9.7 8.9 - 09.6 mg/dL   Total Protein 7.7 6.5 - 8.1 g/dL   Albumin 3.1 (L) 3.5 - 5.0 g/dL   AST 78 (H) 15 - 41 U/L   ALT 39 14 - 54 U/L   Alkaline Phosphatase 95 38 - 126 U/L   Total Bilirubin 1.2 0.3 - 1.2 mg/dL   GFR calc non Af Amer 6 (L) >60 mL/min   GFR calc Af Amer 7 (L) >60 mL/min   Anion gap 16 (H) 5 - 15  Glucose, capillary     Status: None   Collection Time: 06/15/15  7:41 AM  Result Value Ref Range   Glucose-Capillary 76 65 - 99 mg/dL  CBC     Status: Abnormal   Collection Time: 06/15/15  8:14 AM  Result Value Ref Range   WBC 2.3 (L) 4.0 - 10.5 K/uL   RBC 3.18 (L) 3.87 - 5.11 MIL/uL   Hemoglobin 10.1 (L) 12.0 - 15.0 g/dL   HCT 04.5 (L) 40.9 - 81.1 %   MCV 95.0 78.0 - 100.0 fL   MCH 31.8 26.0 - 34.0 pg   MCHC 33.4 30.0 - 36.0 g/dL   RDW 91.4 (H) 78.2 - 95.6 %   Platelets 63 (L) 150 - 400 K/uL  Lactic acid, plasma     Status: None   Collection Time: 06/15/15  8:14 AM  Result Value Ref Range   Lactic  Acid, Venous 1.2 0.5 - 2.0 mmol/L     Ct Abdomen Pelvis Wo Contrast  06/14/2015  CLINICAL DATA:  Abdominal pain.  Rectal bleeding. EXAM: CT ABDOMEN AND PELVIS WITHOUT CONTRAST TECHNIQUE: Multidetector CT imaging of the abdomen and pelvis was performed following the standard protocol without IV contrast. COMPARISON:  12/26/2014 FINDINGS: Lower chest: Lung bases are clear. Dialysis catheter tip in the right atrium. Mitral annular calcifications. Hepatobiliary: Liver is diffusely nodular and compatible with cirrhosis. Gallbladder has been removed. Common bile duct roughly measures 1 cm and similar to the previous examination. Pancreas: No gross abnormality to the pancreas. Spleen: Spleen is prominent and similar to the previous exam. Again noted are enlarged vascular structures around the spleen which drain into the left renal vein. Known varices around the stomach and distal esophagus are poorly characterized on this examination. Adrenals/Urinary Tract: No acute abnormality to the adrenal glands. Bilateral atrophic kidneys compatible with end-stage renal disease. Evidence for at least 2 cysts in the left kidney. No hydronephrosis. Urinary bladder is decompressed. Stomach/Bowel: Difficult to exclude wall thickening in the rectal region. There is no significant pericolonic inflammation. Mild mesenteric edema is nonspecific. No evidence for a bowel obstruction. Vascular/Lymphatic: Atherosclerotic calcifications in the aorta without aneurysm. No significant lymphadenopathy. Reproductive: Again noted is a low-density structure in the left adnexa. This structure now measures up to 3.5 cm and previously measured 2.5 cm. No gross abnormality to the uterus. Other: There is no significant free fluid or ascites. Musculoskeletal: The bony structures are very heterogeneous with patchy areas of sclerosis. Again  noted is severe disc disease at L2-L3 with multiple Schmorl's nodes and vacuum phenomenon. IMPRESSION: No acute  abnormality within the abdomen or pelvis. Changes compatible with end-stage renal disease. 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 Persistent left adnexal cystic structure. However, this structure may be enlarging. Consider a non-emergent pelvic ultrasound to ensure that this has benign characteristics in a postmenopausal female. 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 Electronically Signed   By: Richarda Overlie M.D.   On: 06/14/2015 21:16   Ct Head Wo Contrast  06/14/2015  CLINICAL DATA:  Altered mental status.  Unsteady gait.  Confusion. EXAM: CT HEAD WITHOUT CONTRAST TECHNIQUE: Contiguous axial images were obtained from the base of the skull through the vertex without intravenous contrast. COMPARISON:  None. FINDINGS: Sinuses/Soft tissues: renal osteodystrophy. Clear paranasal sinuses and mastoid air cells. Intracranial: Age advanced cerebral atrophy. Moderate low density in the periventricular white matter likely related to small vessel disease. No mass lesion, hemorrhage, hydrocephalus, acute infarct, intra-axial, or extra-axial fluid collection. IMPRESSION: 1.  No acute intracranial abnormality. 2.  Cerebral atrophy and small vessel ischemic change. Electronically Signed   By: Jeronimo Greaves M.D.   On: 06/14/2015 21:05   Dg Chest Port 1 View  06/14/2015  CLINICAL DATA:  70 year old female with altered mental status and intermittent confusion and unsteady gait EXAM: PORTABLE CHEST 1 VIEW COMPARISON:  Chest radiograph dated 01/12/2015 FINDINGS: Single-view of the chest does not demonstrate focal consolidation. There is no pleural effusion or pneumothorax. There are mild diffuse vascular interstitial prominence, likely mild congestive changes. There is stable mild cardiomegaly. Eight dwell lumen dialysis catheter is noted in stable position. The degenerative changes of the osseous structures with bilateral shoulder arthroplasties. No acute fracture. IMPRESSION: Stable appearing minimal congestive  changes. No focal consolidation. Electronically Signed   By: Elgie Collard M.D.   On: 06/14/2015 20:06    ROS:  As stated above in the HPI otherwise negative.  Blood pressure 115/47, pulse 68, temperature 98.4 F (36.9 C), temperature source Axillary, resp. rate 18, weight 79.379 kg (175 lb), SpO2 100 %.    PE: Gen: NAD, Alert HEENT:  [place]/AT, EOMI Neck: Supple, no LAD Lungs: CTA Bilaterally CV: RRR without M/G/R ABM: Soft, tender in the RLQ, +BS Ext: No C/C/E Rectal: Unable to perform at this time.  Assessment/Plan: 1) Hematochezia. 2) Lower abdominal pain. 3) Cirrhosis. 4) Hepatic encephalopathy.   I am unable to discern the source of the hematochezia.  No prior documentation of a colonoscopy and she has confusion at this time.  The patient has a history of chronic abdominal pain, but she appears comfortable currently outside of her ultrasound examination.  Plan: 1) Follow HGB. 2) Transfuse if necessary. 3) I will follow up with a rectal examination.  Kingsten Enfield D 06/15/2015, 1:09 PM

## 2015-06-15 NOTE — Consult Note (Signed)
[facility] Renal Consultation Note    Indication for Consultation:  Management of ESRD/hemodialysis; anemia, hypertension/volume and secondary hyperparathyroidism PCP: Dorrene German, MD   HPI: Samantha [name] is a 70 y.o. female with ESRD secondary to unknown etiology on HD for five years at [facility] on MWF schedule.  Past medical hsitory is significant for hx of prior hepatits B, hx of alcohol abuse, known cirrhosis with prior coagulopathy and pancytopenia, hx BID due to gastric ulcer, esophageal varicies and gall stone pancreatits.  She had an episode of Ecoli bacteremia 12/2014.  She attended dialysis yesterday for her full treatment yesterday without complaints. She attained her EDW.  Net UF was 2.2 Pre BP were 140 -150/86-94 and post BP were 173/107 sitting and 142/92 standing.  She has seen [facility] for access placement which she initially declined but when seen on 2/24,  Dr. Imogene Burn felt that her functional status had declined and recommended deferring permanent access for the time being.    Yesterday after dialysis she was noted by her family to have diarrhea, abdominal pain, worsening confusion and rectal bleeding. She was brought to the ED for evaluation.  She didn't have N, V, fever, SOB, CP. Evaluation in the ED showed heme + stool.  Hgb gradually trending down since admission now 10.1  WBC low in 2s, thrombocytopenia - platelets most recently 63K K this am is 3.9. Albumin 3.1, AST 78 ALF 39. T Bili 1.2 TSH 5 Head CT neg acute change, Abdominal/Pelvic CT neg acute change.  Past Medical History  Diagnosis Date  . Shoulder pain   . Headache(784.0)     occasionally  . Dizziness   . Occasional numbness/prickling/tingling of fingers and toes   . Arthritis     left shoulder  . Joint pain   . Joint swelling   . Gastric ulcer   . Dry skin   . Peripheral edema   . Constipation   . Oligouria   . H/O: GI bleed   . Hepatitis     Hep B  . History of kidney stones   . History of  blood transfusion   . Hypertension   . Anemia   . Hypothyroidism     takes Synthroid daily  . Impaired hearing   . Diabetes mellitus     borderline  . Renal disorder     m, w, F    Past Surgical History  Procedure Laterality Date  . Shoulder surgery  3-12yrs ago    right replacement  . Esophagogastroduodenoscopy  06/09/2011    Procedure: ESOPHAGOGASTRODUODENOSCOPY (EGD);  Surgeon: Theda Belfast, MD;  Location: [facility] ENDOSCOPY;  Service: Endoscopy;  Laterality: N/A;  . Cataract surgery      bilateral  . Reverse shoulder arthroplasty  09/22/2011    Procedure: REVERSE SHOULDER ARTHROPLASTY;  Surgeon: Verlee Rossetti, MD;  Location: [facility] OR;  Service: Orthopedics;  Laterality: Left;  left reverse shoulder arthroplasty  . Cholecystectomy  12/26/2011  . Cholecystectomy  12/26/2011    Procedure: LAPAROSCOPIC CHOLECYSTECTOMY;  Surgeon: Atilano Ina, MD,FACS;  Location: [facility] OR;  Service: General;  Laterality: N/A;  . Arteriovenous graft placement Left     forearm  . Shuntogram Left 08/29/2012    Procedure: SHUNTOGRAM;  Surgeon: Fransisco Hertz, MD;  Location: [facility] CATH LAB;  Service: Cardiovascular;  Laterality: Left;  arm  . Revision of arteriovenous goretex graft Left 01/12/2015    Procedure: REVISION OF Left FOREARM ARTERIOVENOUS GORETEX GRAFT;  Surgeon: Fransisco Hertz,  MD;  Location: [facility] OR;  Service: Vascular;  Laterality: Left;  . Insertion of dialysis catheter Right 01/12/2015    Procedure: INSERTION OF DIALYSIS CATHETER;  Surgeon: Fransisco Hertz, MD;  Location: [facility] OR;  Service: Vascular;  Laterality: Right;  . I&d extremity Left 03/23/2015    Procedure: DRAINAGE OF LEFT ARM SEROMA;  Surgeon: Fransisco Hertz, MD;  Location: [facility] OR;  Service: Vascular;  Laterality: Left;  . Ligation arteriovenous gortex graft Left 03/23/2015    Procedure: LIGATION AND EXCISION OF LEFT ARTERIOVENOUS GORTEX GRAFT;  Surgeon: Fransisco Hertz, MD;  Location: [facility] OR;  Service: Vascular;  Laterality: Left;   Family History  Problem  Relation Age of Onset  . Hypertension Mother   . Diabetes Mother   . Cancer Brother    Social History:  reports that she has never smoked. Her smokeless tobacco use includes Chew. She reports that she does not drink alcohol or use illicit drugs. Allergies  Allergen Reactions  . Banana Other (See Comments)    Due to dialysis  . Chocolate Other (See Comments)    Due to dialysis  . Fish-Derived Products Other (See Comments)    Due to gout   Prior to Admission medications   Medication Sig Start Date End Date Taking? Authorizing Provider  acetaminophen (TYLENOL) 500 MG tablet Take 500 mg by mouth every 6 (six) hours as needed for moderate pain.   Yes Historical Provider, MD  aspirin 325 MG tablet Take 325 mg by mouth every 6 (six) hours as needed for mild pain or moderate pain.    Yes Historical Provider, MD  levothyroxine (SYNTHROID, LEVOTHROID) 50 MCG tablet Take 50 mcg by mouth daily before breakfast.  04/15/15  Yes Historical Provider, MD  metoprolol (LOPRESSOR) 50 MG tablet Take 50 mg by mouth 2 (two) times daily.   Yes Historical Provider, MD  pantoprazole (PROTONIX) 40 MG tablet Take 40 mg by mouth daily. 05/02/14  Yes Historical Provider, MD  HYDROcodone-acetaminophen (NORCO/VICODIN) 5-325 MG tablet Take 1 tablet by mouth every 6 (six) hours as needed for moderate pain. Patient not taking: Reported on 06/14/2015 03/23/15   Raymond Gurney, PA-C   Current Facility-Administered Medications  Medication Dose Route Frequency Provider Last Rate Last Dose  . hydrOXYzine (VISTARIL) injection 25 mg  25 mg Intramuscular Q6H PRN Lorretta Harp, MD      . lactulose (CHRONULAC) 10 GM/15ML solution 30 g  30 g Oral TID Lorretta Harp, MD   30 g at 06/15/15 1610  . levothyroxine (SYNTHROID, LEVOTHROID) tablet 50 mcg  50 mcg Oral QAC breakfast Lorretta Harp, MD   50 mcg at 06/15/15 [phone]  . metoprolol tartrate (LOPRESSOR) tablet 50 mg  50 mg Oral BID Lorretta Harp, MD   50 mg at 06/15/15 5409  . morphine 2 MG/ML  injection 1 mg  1 mg Intravenous Q3H PRN Lorretta Harp, MD      . pantoprazole (PROTONIX) injection 40 mg  40 mg Intravenous Q12H Lorretta Harp, MD   40 mg at 06/15/15 8119  . piperacillin-tazobactam (ZOSYN) IVPB 2.25 g  2.25 g Intravenous 3 times per day Armandina Stammer, RPH   2.25 g at 06/15/15 0329  . sodium chloride flush (NS) 0.9 % injection 3 mL  3 mL Intravenous Q12H Lorretta Harp, MD   3 mL at 06/14/15 2310   Labs: Basic Metabolic Panel:  Recent Labs Lab 06/14/15 1833 06/15/15 0400  NA 134* 138  K 4.0 3.9  CL 96* 102  CO2 23 20*  GLUCOSE 103* 75  BUN 13 19  CREATININE 5.17* 6.28*  CALCIUM 10.7* 9.7   Liver Function Tests:  Recent Labs Lab 06/14/15 1833 06/15/15 0400  AST 69* 78*  ALT 35 39  ALKPHOS 112 95  BILITOT 1.0 1.2  PROT 9.1* 7.7  ALBUMIN 3.5 3.1*    Recent Labs Lab 06/14/15 2051  AMMONIA 73*   CBC:  Recent Labs Lab 06/14/15 1833 06/15/15 0030 06/15/15 0400 06/15/15 0814  WBC 2.4* 2.7* 2.4* 2.3*  NEUTROABS 1.4*  --   --   --   HGB 12.4 11.8* 11.4* 10.1*  HCT 36.2 33.9* 34.0* 30.2*  MCV 92.6 93.6 94.7 95.0  PLT 89* 74* 79* 63*   CBG:  Recent Labs Lab 06/15/15 0741  GLUCAP 76   Studies/Results: Ct Abdomen Pelvis Wo Contrast  06/14/2015  CLINICAL DATA:  Abdominal pain.  Rectal bleeding. EXAM: CT ABDOMEN AND PELVIS WITHOUT CONTRAST TECHNIQUE: Multidetector CT imaging of the abdomen and pelvis was performed following the standard protocol without IV contrast. COMPARISON:  12/26/2014 FINDINGS: Lower chest: Lung bases are clear. Dialysis catheter tip in the right atrium. Mitral annular calcifications. Hepatobiliary: Liver is diffusely nodular and compatible with cirrhosis. Gallbladder has been removed. Common bile duct roughly measures 1 cm and similar to the previous examination. Pancreas: No gross abnormality to the pancreas. Spleen: Spleen is prominent and similar to the previous exam. Again noted are enlarged vascular structures around the spleen  which drain into the left renal vein. Known varices around the stomach and distal esophagus are poorly characterized on this examination. Adrenals/Urinary Tract: No acute abnormality to the adrenal glands. Bilateral atrophic kidneys compatible with end-stage renal disease. Evidence for at least 2 cysts in the left kidney. No hydronephrosis. Urinary bladder is decompressed. Stomach/Bowel: Difficult to exclude wall thickening in the rectal region. There is no significant pericolonic inflammation. Mild mesenteric edema is nonspecific. No evidence for a bowel obstruction. Vascular/Lymphatic: Atherosclerotic calcifications in the aorta without aneurysm. No significant lymphadenopathy. Reproductive: Again noted is a low-density structure in the left adnexa. This structure now measures up to 3.5 cm and previously measured 2.5 cm. No gross abnormality to the uterus. Other: There is no significant free fluid or ascites. Musculoskeletal: The bony structures are very heterogeneous with patchy areas of sclerosis. Again noted is severe disc disease at L2-L3 with multiple Schmorl's nodes and vacuum phenomenon. IMPRESSION: No acute abnormality within the abdomen or pelvis. Changes compatible with end-stage renal disease. 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 Persistent left adnexal cystic structure. However, this structure may be enlarging. Consider a non-emergent pelvic ultrasound to ensure that this has benign characteristics in a postmenopausal female. 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 Electronically Signed   By: Richarda Overlie M.D.   On: 06/14/2015 21:16   Ct Head Wo Contrast  06/14/2015  CLINICAL DATA:  Altered mental status.  Unsteady gait.  Confusion. EXAM: CT HEAD WITHOUT CONTRAST TECHNIQUE: Contiguous axial images were obtained from the base of the skull through the vertex without intravenous contrast. COMPARISON:  None. FINDINGS: Sinuses/Soft tissues: renal osteodystrophy. Clear paranasal sinuses and  mastoid air cells. Intracranial: Age advanced cerebral atrophy. Moderate low density in the periventricular white matter likely related to small vessel disease. No mass lesion, hemorrhage, hydrocephalus, acute infarct, intra-axial, or extra-axial fluid collection. IMPRESSION: 1.  No acute intracranial abnormality. 2.  Cerebral atrophy and small vessel ischemic change. Electronically Signed   By: Jeronimo Greaves M.D.   On: 06/14/2015 21:05  Dg Chest Port 1 View  06/14/2015  CLINICAL DATA:  70 year old female with altered mental status and intermittent confusion and unsteady gait EXAM: PORTABLE CHEST 1 VIEW COMPARISON:  Chest radiograph dated 01/12/2015 FINDINGS: Single-view of the chest does not demonstrate focal consolidation. There is no pleural effusion or pneumothorax. There are mild diffuse vascular interstitial prominence, likely mild congestive changes. There is stable mild cardiomegaly. Eight dwell lumen dialysis catheter is noted in stable position. The degenerative changes of the osseous structures with bilateral shoulder arthroplasties. No acute fracture. IMPRESSION: Stable appearing minimal congestive changes. No focal consolidation. Electronically Signed   By: Elgie Collard M.D.   On: 06/14/2015 20:06    ROS: As per HPI otherwise negative.  Physical Exam: Filed Vitals:   06/14/15 2145 06/14/15 2300 06/15/15 0430 06/15/15 0833  BP: 134/74 149/77 106/70 115/47  Pulse: 80 78 64 68  Temp:  98.2 F (36.8 C) 97.9 F (36.6 C) 98.4 F (36.9 C)  TempSrc:  Oral Oral Axillary  Resp: Weight:      SpO2: 100% 97% 100% 100%     General:  Elderly obese short  AAF NAD sitting in chair requesting something to eat.  C/o didn't sleep well due to "running off all night due to the medicine they made her take Head: Normocephalic, atraumatic, sclera non-icteric, MM dry, very HOH, voice chronically raspy Neck: Supple. JVD not elevated. Lungs: grossly clear, some coarseness to BS, Breathing  is unlabored. Heart: RRR with S1 S2. No murmurs, rubs, or gallops appreciated. Abdomen: Soft,  Generalized tenderness mid to lower mid abdomen, liver firm,  No rebound/guarding. Lower extremities: without pitting edema or ischemic changes, no open wounds , long thickened nails, hammer toes,  Neuro: Alert and oriented X 3. Moves all extremities spontaneously. Psych:  Responds to questions appropriately with a normal affect. Dialysis Access: right IJ cath exit - no erythema or drainage  Dialysis Orders:  [facility] 4 hr 160 400//A1.5 2 K 3.5 Ca profile 4 low temp heparin 2000 EDW 76.5 venofer 50 last 2/24 Mircera 75 q 4 weeks last 2/15  Recent labs;  Hgb 11.4 37%sat ferritin 506 Ca/P ok iPTH 272  Assessment/Plan: 1. AMS/cirrhosis - elevated NH3 73, AST up - AMS likely secondary to liver dz, but need to r/o infectious source given catheter; pt cultured- on Vanc and Zosyn; lactulose causing diarrhea, but MS seems near baseline to me 2. Rectal bleeding - follow CBC, hold heparin on HD - per primary 3. ESRD -  MWF - HD tomorrow; if she is to continue dialysis, she needs a permanent access 4. Hypertension/volume  - BP much lower here than outpt HD -wonder if she really was taking MTP at home 5. Anemia  - Hgb 11.4 post HD Monday down to 10.1 - watch - ESA due for redose mid March- may need to redose sooner 6. Metabolic bone disease -  Prone to hypocalcemia - requires added Ca bath; hectorol; no binders or sensipar 7. Nutrition -Npo at present - I see no reason she cannot eat unless she is going to have a test 8. Pancytopenia - chronic -follow-  9. Hypothyroidism - TSH 5 on synthroid 10. DNR 11. L2-3 disc dz per CT  Sheffield Slider, PA-C [facility] [phone] 06/15/2015, 11:51 AM

## 2015-06-15 NOTE — Evaluation (Addendum)
Physical Therapy Evaluation Patient Details Name: Samantha [name] MRN: [id number] DOB: 17-Feb-1946 Today's Date: 06/15/2015   History of Present Illness  70 y.o. female with PMH of hypertension, hypothyroidism, ESRD-HD, headache, dizziness, occasional tingling sensation in fingers, gastric ulcer, GI bleeding, hepatitis B, diastolic congestive heart failure, chronic leukopenia, who presents with altered mental status, abdominal pain and rectal bleeding  Clinical Impression  Patient demonstrates deficits in functional mobility as indicated below. Will need continued skilled PT to address deficits and maximize function. Will see as indicated and progress as tolerated. OF NOTE: Patient mobilizing with min guard/ min assist. If family is able to provide this level of assist at home, may consider HHPT and family assist 24/7 upon discharge, otherwise, will need ST SNF.     Follow Up Recommendations SNF;Supervision/Assistance - 24 hour (may consider HHPT if family to provide adequate assist)    Equipment Recommendations  None recommended by PT    Recommendations for Other Services       Precautions / Restrictions Precautions Precautions: Fall Restrictions Weight Bearing Restrictions: No      Mobility  Bed Mobility               General bed mobility comments: received in standing  Transfers Overall transfer level: Needs assistance Equipment used: Rolling walker (2 wheeled) Transfers: Sit to/from Stand Sit to Stand: Min guard         General transfer comment: performed from [facility] and from chair. Verbal cues for safety and hand placement with use of RW. Cues to not abanadon RW upon return to sitting.  Ambulation/Gait Ambulation/Gait assistance: Min guard;Min assist Ambulation Distance (Feet): 110 Feet Assistive device: Rolling walker (2 wheeled) Gait Pattern/deviations: Step-through pattern;Decreased stride length;Shuffle;Drifts right/left;Trunk flexed Gait velocity:  decreased Gait velocity interpretation: Below normal speed for age/gender General Gait Details: VCs for positioning within RW. Assist for stability during turns and navitation around objects in hall. Assist for manual control of RW at times.  Stairs            Wheelchair Mobility    Modified Rankin (Stroke Patients Only)       Balance Overall balance assessment: History of Falls                                           Pertinent Vitals/Pain  Denies pain at this time    Home Living Family/patient expects to be discharged to:: Private residence Living Arrangements: Other relatives Available Help at Discharge: Family;Available PRN/intermittently Type of Home: House Home Access: Level entry     Home Layout: Two level;Able to live on main level with bedroom/bathroom Home Equipment: Cane - single point (walking stick)      Prior Function Level of Independence: Independent with assistive device(s)               Hand Dominance   Dominant Hand: Right    Extremity/Trunk Assessment   Upper Extremity Assessment: Generalized weakness           Lower Extremity Assessment: Generalized weakness;Difficult to assess due to impaired cognition (increased bilateral LE edema noted)      Cervical / Trunk Assessment:  (increased body habitus)  Communication   Communication: HOH  Cognition Arousal/Alertness: Awake/alert Behavior During Therapy: WFL for tasks assessed/performed Overall Cognitive Status: Impaired/Different from baseline Area of Impairment: Orientation;Attention;Following commands;Safety/judgement;Awareness;Problem solving Orientation Level: Disoriented to;Situation Current Attention Level: Sustained  Following Commands: Follows one step commands consistently Safety/Judgement: Decreased awareness of safety Awareness: Emergent Problem Solving: Requires verbal cues;Requires tactile cues General Comments: patient received mobilizing  on her own, very unsteady with poor insight as to nature of assist required. Difficulty follow commands at times secondary to [facility] and poor recptivity to specific instructions for strength assessment.    General Comments      Exercises        Assessment/Plan    PT Assessment Patient needs continued PT services  PT Diagnosis Difficulty walking;Abnormality of gait;Generalized weakness;Altered mental status   PT Problem List Decreased strength;Decreased activity tolerance;Decreased balance;Decreased mobility;Decreased cognition;Decreased knowledge of use of DME;Decreased safety awareness;Obesity  PT Treatment Interventions DME instruction;Gait training;Functional mobility training;Therapeutic activities;Therapeutic exercise;Balance training;Cognitive remediation;Patient/family education   PT Goals (Current goals can be found in the Care Plan section) Acute Rehab PT Goals Patient Stated Goal: to be able to eat PT Goal Formulation: With patient Time For Goal Achievement: 06/29/15 Potential to Achieve Goals: Good    Frequency Min 3X/week   Barriers to discharge        Co-evaluation               End of Session Equipment Utilized During Treatment: Gait belt Activity Tolerance: Patient tolerated treatment well Patient left: in chair;with call bell/phone within reach;with chair alarm set Nurse Communication: Mobility status         Time: 1610-9604 PT Time Calculation (min) (ACUTE ONLY): 19 min   Charges:   PT Evaluation $PT Eval Moderate Complexity: 1 Procedure     PT G CodesFabio Asa 2015-06-18, 1:30 PM Charlotte Crumb, PT DPT  [phone]

## 2015-06-15 NOTE — Progress Notes (Signed)
[facility] PROGRESS NOTE  ORINE GOGA EAV:[id number] DOB: 01-Nov-1945 DOA: 06/14/2015 PCP: Dorrene German, MD  Please refer to admission H&P from early this morning in details, in brief, 70 year old female with hypertension, hypothyroidism, ESRD on dialysis (M, W, F), history of gastric ulcer with GI bleed, cirrhosis and pancytopenia (history of gastropathy and nonbleeding dialysis), presented from home with acute encephalopathy for the past 3 days associated with abdominal pain and bright red blood per rectum. In the ED she had elevated ammonia of 73, positive FOBT, drop in hemoglobin to 11 from recent baseline of about 13. Chest x-ray was negative for infiltrate. Head CT was unremarkable. CT of the abdomen and pelvis was unremarkable as well except for cirrhosis with total hypertension and varices but no ascites. Also commented on persistent left adnexal cystic structure. She had lactic acid elevated to 2.89. Creatinine of 5.17. Vitals were otherwise stable. Admitted to hospitalist service for further management.  Assessment/Plan: Acute encephalopathy Suspect hepatic encephalopathy. Received lactulose upon admission and mentation is much better this morning. Continue lactulose titrated to at least 2-3 bowel movements daily.   ? Sepsis Besides elevated lactic acid there is no obvious signs of sepsis. She has chronic neutropenia. Vitals were stable on presentation. Monitor off antibiotics. She was treated with Elita Quick for Escherichia coli bacteremia in September 2016 (total 21 days course) .  Rectal bleed Concern for upper GI bleed given her history of esophageal varices and portal gastropathy  (as seen on EGD from 2013). Drop in H&H noted from recent. Continue to monitor. GI Dr. Elnoria Howard consulted who will see the patient.   Cirrhosis of liver I'm not sure of the etiology. Previously thought to be due to hepatitis B but her hepatitis B DNA checked during prior hospitalization was  negative.   ESRD on dialysis (M, W, F) Renal notified. Dialysis yesterday.  Pancytopenia Secondary to cirrhosis. Baseline WC and platelets  Left adnexal mass Will obtain pelvic ultrasound   Code Status: DO NOT RESUSCITATE Family Communication: None at bedside Disposition Plan: Continue inpatient monitoring   Consultants:  Renal  GI Dr. Audley Hose  Procedures:  Head CT  CT abdomen and pelvis  Antibiotics:  None  HPI/Subjective: Seen and examined. Appears much oriented. Abdominal pain better. No further rectal bleed.  Objective: Filed Vitals:   06/15/15 0430 06/15/15 0833  BP: 106/70 115/47  Pulse: 64 68  Temp: 97.9 F (36.6 C) 98.4 F (36.9 C)  Resp: 18 18    Intake/Output Summary (Last 24 hours) at 06/15/15 1331 Last data filed at 06/15/15 0834  Gross per 24 hour  Intake   2040 ml  Output     26 ml  Net   2014 ml   Filed Weights   06/14/15 2128  Weight: 79.379 kg (175 lb)    Exam:   General: Elderly female not in distress  HEENT: Moist mucosa, pallor present  Chest: Clear bilaterally  CVS: Normal S1 and S2, no murmurs  GI: Soft, nondistended, mild left flank tenderness, bowel sounds present  Musculoskeletal: Warm, no edema  CNS: Hard of hearing, oriented 2    Data Reviewed: Basic Metabolic Panel:  Recent Labs Lab 06/14/15 1833 06/15/15 0400  NA 134* 138  K 4.0 3.9  CL 96* 102  CO2 23 20*  GLUCOSE 103* 75  BUN 13 19  CREATININE 5.17* 6.28*  CALCIUM 10.7* 9.7   Liver Function Tests:  Recent Labs Lab 06/14/15 1833 06/15/15 0400  AST 69* 78*  ALT 35  39  ALKPHOS 112 95  BILITOT 1.0 1.2  PROT 9.1* 7.7  ALBUMIN 3.5 3.1*   No results for input(s): LIPASE, AMYLASE in the last 168 hours.  Recent Labs Lab 06/14/15 2051  AMMONIA 73*   CBC:  Recent Labs Lab 06/14/15 1833 06/15/15 0030 06/15/15 0400 06/15/15 0814  WBC 2.4* 2.7* 2.4* 2.3*  NEUTROABS 1.4*  --   --   --   HGB 12.4 11.8* 11.4* 10.1*  HCT 36.2 33.9*  34.0* 30.2*  MCV 92.6 93.6 94.7 95.0  PLT 89* 74* 79* 63*   Cardiac Enzymes: No results for input(s): CKTOTAL, CKMB, CKMBINDEX, TROPONINI in the last 168 hours. BNP (last 3 results) No results for input(s): BNP in the last 8760 hours.  ProBNP (last 3 results) No results for input(s): PROBNP in the last 8760 hours.  CBG:  Recent Labs Lab 06/15/15 0741  GLUCAP 76    Recent Results (from the past 240 hour(s))  MRSA PCR Screening     Status: None   Collection Time: 06/14/15 11:29 PM  Result Value Ref Range Status   MRSA by PCR NEGATIVE NEGATIVE Final    Comment:        The GeneXpert MRSA Assay (FDA approved for NASAL specimens only), is one component of a comprehensive MRSA colonization surveillance program. It is not intended to diagnose MRSA infection nor to guide or monitor treatment for MRSA infections.      Studies: Ct Abdomen Pelvis Wo Contrast  06/14/2015  CLINICAL DATA:  Abdominal pain.  Rectal bleeding. EXAM: CT ABDOMEN AND PELVIS WITHOUT CONTRAST TECHNIQUE: Multidetector CT imaging of the abdomen and pelvis was performed following the standard protocol without IV contrast. COMPARISON:  12/26/2014 FINDINGS: Lower chest: Lung bases are clear. Dialysis catheter tip in the right atrium. Mitral annular calcifications. Hepatobiliary: Liver is diffusely nodular and compatible with cirrhosis. Gallbladder has been removed. Common bile duct roughly measures 1 cm and similar to the previous examination. Pancreas: No gross abnormality to the pancreas. Spleen: Spleen is prominent and similar to the previous exam. Again noted are enlarged vascular structures around the spleen which drain into the left renal vein. Known varices around the stomach and distal esophagus are poorly characterized on this examination. Adrenals/Urinary Tract: No acute abnormality to the adrenal glands. Bilateral atrophic kidneys compatible with end-stage renal disease. Evidence for at least 2 cysts in the  left kidney. No hydronephrosis. Urinary bladder is decompressed. Stomach/Bowel: Difficult to exclude wall thickening in the rectal region. There is no significant pericolonic inflammation. Mild mesenteric edema is nonspecific. No evidence for a bowel obstruction. Vascular/Lymphatic: Atherosclerotic calcifications in the aorta without aneurysm. No significant lymphadenopathy. Reproductive: Again noted is a low-density structure in the left adnexa. This structure now measures up to 3.5 cm and previously measured 2.5 cm. No gross abnormality to the uterus. Other: There is no significant free fluid or ascites. Musculoskeletal: The bony structures are very heterogeneous with patchy areas of sclerosis. Again noted is severe disc disease at L2-L3 with multiple Schmorl's nodes and vacuum phenomenon. IMPRESSION: No acute abnormality within the abdomen or pelvis. Changes compatible with end-stage renal disease. 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 Persistent left adnexal cystic structure. However, this structure may be enlarging. Consider a non-emergent pelvic ultrasound to ensure that this has benign characteristics in a postmenopausal female. 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 Electronically Signed   By: Richarda Overlie M.D.   On: 06/14/2015 21:16   Ct Head Wo Contrast  06/14/2015  CLINICAL  DATA:  Altered mental status.  Unsteady gait.  Confusion. EXAM: CT HEAD WITHOUT CONTRAST TECHNIQUE: Contiguous axial images were obtained from the base of the skull through the vertex without intravenous contrast. COMPARISON:  None. FINDINGS: Sinuses/Soft tissues: renal osteodystrophy. Clear paranasal sinuses and mastoid air cells. Intracranial: Age advanced cerebral atrophy. Moderate low density in the periventricular white matter likely related to small vessel disease. No mass lesion, hemorrhage, hydrocephalus, acute infarct, intra-axial, or extra-axial fluid collection. IMPRESSION: 1.  No acute intracranial  abnormality. 2.  Cerebral atrophy and small vessel ischemic change. Electronically Signed   By: Jeronimo Greaves M.D.   On: 06/14/2015 21:05   Dg Chest Port 1 View  06/14/2015  CLINICAL DATA:  70 year old female with altered mental status and intermittent confusion and unsteady gait EXAM: PORTABLE CHEST 1 VIEW COMPARISON:  Chest radiograph dated 01/12/2015 FINDINGS: Single-view of the chest does not demonstrate focal consolidation. There is no pleural effusion or pneumothorax. There are mild diffuse vascular interstitial prominence, likely mild congestive changes. There is stable mild cardiomegaly. Eight dwell lumen dialysis catheter is noted in stable position. The degenerative changes of the osseous structures with bilateral shoulder arthroplasties. No acute fracture. IMPRESSION: Stable appearing minimal congestive changes. No focal consolidation. Electronically Signed   By: Elgie Collard M.D.   On: 06/14/2015 20:06    Scheduled Meds: . [START ON 06/16/2015] doxercalciferol  3 mcg Intravenous Q M,W,F-HD  . [START ON 06/18/2015] ferric gluconate (FERRLECIT/NULECIT) IV  62.5 mg Intravenous Q Fri-HD  . lactulose  30 g Oral TID  . levothyroxine  50 mcg Oral QAC breakfast  . metoprolol  50 mg Oral BID  . multivitamin  1 tablet Oral QHS  . pantoprazole (PROTONIX) IV  40 mg Intravenous Q12H  . piperacillin-tazobactam (ZOSYN)  IV  2.25 g Intravenous 3 times per day  . sodium chloride flush  3 mL Intravenous Q12H   Continuous Infusions:    Time spent: 15 minutes    Yannick Steuber  [facility] Pager [phone]. If 7PM-7AM, please contact night-coverage at [email or web address], password [facility] 06/15/2015, 1:31 PM  LOS: 1 day

## 2015-06-15 NOTE — Progress Notes (Signed)
Critical lactic acid 2.4, md aware. No new orders, will continue to monitor.

## 2015-06-15 NOTE — Progress Notes (Signed)
Pharmacy was asked by admitting physician (Dr. Jarvis Morgan) to clarify patient's PTA antibiotics.  Pharmacy medication history technician was told the patient was still on ceftazidime 2g IV qHD-MWF as an outpatient. Per discharge summary for 12/25/14 admission, patient should have been on Fortaz x21d. There was still some mention of this medication being active in outpatient vascular surgery notes.  I called [facility] [phone]) and spoke with Katie. She told me the last time patient had received ceftazidime at the center was on 9/26/216.  I removed ceftazidime from patient's medication history list as she is NOT on it PTA.  Teiana Hajduk D. Darald Uzzle, PharmD, BCPS Clinical Pharmacist Pager: [phone] 06/15/2015 8:29 AM

## 2015-06-16 ENCOUNTER — Encounter (HOSPITAL_COMMUNITY): Payer: Self-pay | Admitting: *Deleted

## 2015-06-16 DIAGNOSIS — K7030 Alcoholic cirrhosis of liver without ascites: Secondary | ICD-10-CM

## 2015-06-16 DIAGNOSIS — Z992 Dependence on renal dialysis: Secondary | ICD-10-CM

## 2015-06-16 DIAGNOSIS — B1910 Unspecified viral hepatitis B without hepatic coma: Secondary | ICD-10-CM

## 2015-06-16 DIAGNOSIS — I10 Essential (primary) hypertension: Secondary | ICD-10-CM

## 2015-06-16 DIAGNOSIS — D696 Thrombocytopenia, unspecified: Secondary | ICD-10-CM

## 2015-06-16 DIAGNOSIS — G9340 Encephalopathy, unspecified: Secondary | ICD-10-CM

## 2015-06-16 DIAGNOSIS — K922 Gastrointestinal hemorrhage, unspecified: Secondary | ICD-10-CM

## 2015-06-16 DIAGNOSIS — D631 Anemia in chronic kidney disease: Secondary | ICD-10-CM

## 2015-06-16 DIAGNOSIS — N189 Chronic kidney disease, unspecified: Secondary | ICD-10-CM

## 2015-06-16 DIAGNOSIS — N186 End stage renal disease: Secondary | ICD-10-CM

## 2015-06-16 DIAGNOSIS — K7460 Unspecified cirrhosis of liver: Secondary | ICD-10-CM

## 2015-06-16 DIAGNOSIS — R1031 Right lower quadrant pain: Secondary | ICD-10-CM

## 2015-06-16 LAB — HEPATITIS B SURFACE ANTIGEN: Hepatitis B Surface Ag: NEGATIVE

## 2015-06-16 LAB — CBC
HCT: 33.6 % — ABNORMAL LOW (ref 36.0–46.0)
HEMATOCRIT: 38.4 % (ref 36.0–46.0)
HEMOGLOBIN: 12.9 g/dL (ref 12.0–15.0)
Hemoglobin: 11 g/dL — ABNORMAL LOW (ref 12.0–15.0)
MCH: 31.3 pg (ref 26.0–34.0)
MCH: 31.7 pg (ref 26.0–34.0)
MCHC: 32.7 g/dL (ref 30.0–36.0)
MCHC: 33.6 g/dL (ref 30.0–36.0)
MCV: 95.5 fL (ref 78.0–100.0)
MCV: 94.3 fL (ref 78.0–100.0)
PLATELETS: 68 10*3/uL — AB (ref 150–400)
Platelets: 71 10*3/uL — ABNORMAL LOW (ref 150–400)
RBC: 3.52 MIL/uL — ABNORMAL LOW (ref 3.87–5.11)
RBC: 4.07 MIL/uL (ref 3.87–5.11)
RDW: 16.4 % — AB (ref 11.5–15.5)
RDW: 16.2 % — AB (ref 11.5–15.5)
WBC: 2.1 10*3/uL — AB (ref 4.0–10.5)
WBC: 2.5 10*3/uL — AB (ref 4.0–10.5)

## 2015-06-16 LAB — BASIC METABOLIC PANEL
ANION GAP: 14 (ref 5–15)
BUN: 28 mg/dL — ABNORMAL HIGH (ref 6–20)
CALCIUM: 8.9 mg/dL (ref 8.9–10.3)
CO2: 20 mmol/L — ABNORMAL LOW (ref 22–32)
CREATININE: 8.6 mg/dL — AB (ref 0.44–1.00)
Chloride: 106 mmol/L (ref 101–111)
GFR, EST AFRICAN AMERICAN: 5 mL/min — AB (ref >60)
GFR, EST NON AFRICAN AMERICAN: 4 mL/min — AB (ref >60)
Glucose, Bld: 88 mg/dL (ref 65–99)
Potassium: 3.2 mmol/L — ABNORMAL LOW (ref 3.5–5.1)
SODIUM: 140 mmol/L (ref 135–145)

## 2015-06-16 MED ORDER — SODIUM CHLORIDE 0.9 % IV SOLN: 100.0000 mL | INTRAVENOUS | Status: DC | PRN

## 2015-06-16 MED ORDER — PENTAFLUOROPROP-TETRAFLUOROETH EX AERO: 1.0000 "application " | INHALATION_SPRAY | CUTANEOUS | Status: DC | PRN

## 2015-06-16 MED ORDER — PANTOPRAZOLE SODIUM 40 MG PO TBEC
40.0000 mg | DELAYED_RELEASE_TABLET | Freq: Two times a day (BID) | ORAL | Status: DC
  Administered 2015-06-16 – 2015-06-19 (×7): 40 mg via ORAL
  Filled 2015-06-16 (×7): qty 1

## 2015-06-16 MED ORDER — ALTEPLASE 2 MG IJ SOLR
2.0000 mg | Freq: Once | INTRAMUSCULAR | Status: DC | PRN
  Filled 2015-06-16: qty 2

## 2015-06-16 MED ORDER — LIDOCAINE-PRILOCAINE 2.5-2.5 % EX CREA
1.0000 "application " | TOPICAL_CREAM | CUTANEOUS | Status: DC | PRN
  Filled 2015-06-16: qty 5

## 2015-06-16 MED ORDER — NADOLOL 20 MG PO TABS
20.0000 mg | ORAL_TABLET | Freq: Every day | ORAL | Status: DC
  Filled 2015-06-16 (×2): qty 1

## 2015-06-16 MED ORDER — LIDOCAINE HCL (PF) 1 % IJ SOLN: 5.0000 mL | INTRAMUSCULAR | Status: DC | PRN

## 2015-06-16 MED ORDER — METOPROLOL TARTRATE 50 MG PO TABS: 50.0000 mg | ORAL_TABLET | Freq: Two times a day (BID) | ORAL | Status: DC

## 2015-06-16 MED ORDER — DOXERCALCIFEROL 4 MCG/2ML IV SOLN
INTRAVENOUS | Status: AC
  Administered 2015-06-16: 3 ug via INTRAVENOUS
  Filled 2015-06-16: qty 2

## 2015-06-16 MED ORDER — HEPARIN SODIUM (PORCINE) 1000 UNIT/ML DIALYSIS: 1000.0000 [IU] | INTRAMUSCULAR | Status: DC | PRN

## 2015-06-16 NOTE — Progress Notes (Signed)
[facility] Progress Note  Patient: Samantha [name] ZOX:[id number]   PCP: Dorrene German, MD DOB: [birth year]   DOA: 06/14/2015   DOS: 06/16/2015   Date of Service: the patient was seen and examined on 06/16/2015  Subjective: Patient does not have any complaint other than right-sided flank pain. No nausea no vomiting but tolerating oral diet and does not have any worsening of the pain while ambulating. No diarrhea no constipation reported.  Assessment and Plan: 1. Acute encephalopathy Slightly hepatic encephalopathy is. Ammonia level elevated on admission. Currently getting better. No asterixis on examination. Continue current management.  2. ESRD on hemodialysis. Tolerating hemodialysis well. Nephrology will continue to follow.  3. Questionable rectal bleeding with the anemia of chronic kidney disease. H&H remains stable. Would discontinue further scheduled H&H. GI was consulted and recommended outpatient follow-up.  4. Abnormal ultrasound of the pelvis. Endometrial thickness. Left ovarian cyst. Patient does have endometrial thickness which is abnormal for her age. If the patient continues to have complaints of rectal bleeding she may have pelvic bleeding and may require a biopsy. Currently her H&H remained stable at present I would recommend the patient to follow-up with OB/GYN as an outpatient for both left ovarian cyst as well as endometrial thickness.  5. Cirrhosis of the liver with thrombocytopenia. History of hepatitis B. Continue lactulose. Continue PPI. Patient is on Lopressor but may benefit from being on nadolol. Getting hemodialysis for volume overload.  6. Abdominal pain. Patient is a right flank pain. CT of the abdomen on admission was unremarkable. Patient has left-sided adnexal mass and left-sided cystic structure. At present we'll continue to closely monitor.  7. Elevated lactic acidosis. Sepsis ruled out. The patient does not have any evidence of  infection. Lactic acid elevation is likely in the setting of liver failure.  DVT Prophylaxis: mechanical compression device Nutrition: Renal diet Advance goals of care discussion: DNR/DNI  Brief Summary of Hospitalization:  HPI: As per the H and P dictated on admission, "Samantha [name] is a 70 y.o. female with PMH of hypertension, hypothyroidism, ESRD-HD, headache, dizziness, occasional tingling sensation in fingers, gastric ulcer, GI bleeding, hepatitis B, diastolic congestive heart failure, chronic leukopenia, who presents with altered mental status, abdominal pain and rectal bleeding.  Per family, patient has been confused in the past 3 days. Her abdominal pain is located in the left lower quadrant. She has diarrhea and mild rectal bleeding with bright right blood in her and nowhere per her grand daughter. They cannot provide detailed inflammations. Patient also has dry cough which is some mild, no fever, chills, shortness of breath or chest pain. No runny nose or sore throat. Patient has been compliant to her dialysis, last dialysis was today. Per her granddaughter, her AVF is not functioning, but the patient refused to left surgeon to put a new AV fistular for her. She is using port-line for dialysis now. Patient does not have nausea, vomiting, diarrhea, unilateral weakness.  In ED, patient was found to have elevated ammonia and 73, positive FOBT, WBC 2.4 which was 2.5 on 12/28/14, thrombocytopenia with platelet 89, lactate 3.51--> 3.89, temperature normal, no tachycardia, creatinine 5.17, BUN 13, bicarbonate 24, normal potassium. Chest x-ray is negative for infiltration. CT head is negative for acute intracranial abnormalities. CT abdomen/pelvis showed no acute abnormality within the abdomen or pelvis; cirrhosis with evidence of portal hypertension and varices, but no significant free fluid or ascites, persistent left adnexal cystic structure. However, this structure may be enlarging. Consider a  non-emergent  pelvic ultrasound to ensure that this has benign characteristics in a postmenopausal female." Daily update, Procedures: Hemodialysis Consultants: Nephrology and gastroenterology Antibiotics: Anti-infectives    Start     Dose/Rate Route Frequency Ordered Stop   06/15/15 0400  piperacillin-tazobactam (ZOSYN) IVPB 2.25 g  Status:  Discontinued     2.25 g 100 mL/hr over 30 Minutes Intravenous 3 times per day 06/14/15 2121 06/15/15 1345   06/14/15 2000  piperacillin-tazobactam (ZOSYN) IVPB 3.375 g     3.375 g 100 mL/hr over 30 Minutes Intravenous  Once 06/14/15 1951 06/14/15 2128   06/14/15 2000  vancomycin (VANCOCIN) IVPB 1000 mg/200 mL premix  Status:  Discontinued     1,000 mg 200 mL/hr over 60 Minutes Intravenous  Once 06/14/15 1951 06/14/15 1953   06/14/15 2000  vancomycin (VANCOCIN) 1,500 mg in sodium chloride 0.9 % 500 mL IVPB     1,500 mg 250 mL/hr over 120 Minutes Intravenous  Once 06/14/15 1953 06/15/15 0139       Family Communication: family was present at bedside, at the time of interview.  Opportunity was given to ask question and all questions were answered satisfactorily.   Disposition:  Expected discharge date: 06/17/2015 Barriers to safe discharge: Stabilization of the symptoms   Intake/Output Summary (Last 24 hours) at 06/16/15 1816 Last data filed at 06/16/15 1639  Gross per 24 hour  Intake    970 ml  Output   2150 ml  Net  -1180 ml   Filed Weights   06/15/15 2019 06/16/15 0846 06/16/15 1300  Weight: 76.658 kg (169 lb) 77.2 kg (170 lb 3.1 oz) 74.9 kg (165 lb 2 oz)    Objective: Physical Exam: Filed Vitals:   06/16/15 1229 06/16/15 1300 06/16/15 1700 06/16/15 1703  BP: 102/60 94/49  86/49  Pulse: 67 69  53  Temp:  98 F (36.7 C)  98.8 F (37.1 C)  TempSrc:  Oral  Oral  Resp: 11 12  16   Height:   4\' 9"  (1.448 m)   Weight:  74.9 kg (165 lb 2 oz)    SpO2:  100%  100%     General: Appear in mild distress, no Rash; Oral Mucosa  moist Cardiovascular: S1 and S2 Present, no Murmur, no JVD Respiratory: Bilateral Air entry present and Clear to Auscultation, no Crackles, no wheezes Abdomen: Bowel Sound present, Soft and no tenderness Extremities: no Pedal edema, no calf tenderness Neurology: Grossly no focal neuro deficit.  Data Reviewed: CBC:  Recent Labs Lab 06/14/15 1833  06/15/15 0814 06/15/15 1850 06/15/15 2206 06/16/15 0402 06/16/15 1547  WBC 2.4*  < > 2.3* 2.5* 2.5* 2.1* 2.5*  NEUTROABS 1.4*  --   --   --   --   --   --   HGB 12.4  < > 10.1* 10.7* 12.0 11.0* 12.9  HCT 36.2  < > 30.2* 32.1* 36.6 33.6* 38.4  MCV 92.6  < > 95.0 93.9 95.6 95.5 94.3  PLT 89*  < > 63* 70* 73* 68* 71*  < > = values in this interval not displayed. Basic Metabolic Panel:  Recent Labs Lab 06/14/15 1833 06/15/15 0400 06/16/15 0402  NA 134* 138 140  K 4.0 3.9 3.2*  CL 96* 102 106  CO2 23 20* 20*  GLUCOSE 103* 75 88  BUN 13 19 28*  CREATININE 5.17* 6.28* 8.60*  CALCIUM 10.7* 9.7 8.9   Liver Function Tests:  Recent Labs Lab 06/14/15 1833 06/15/15 0400  AST 69* 78*  ALT  35 39  ALKPHOS 112 95  BILITOT 1.0 1.2  PROT 9.1* 7.7  ALBUMIN 3.5 3.1*   No results for input(s): LIPASE, AMYLASE in the last 168 hours.  Recent Labs Lab 06/14/15 2051  AMMONIA 73*    Cardiac Enzymes: No results for input(s): CKTOTAL, CKMB, CKMBINDEX, TROPONINI in the last 168 hours.  BNP (last 3 results) No results for input(s): BNP in the last 8760 hours.  CBG:  Recent Labs Lab 06/15/15 0741  GLUCAP 76    Recent Results (from the past 240 hour(s))  Blood Culture (routine x 2)     Status: None (Preliminary result)   Collection Time: 06/14/15  8:35 PM  Result Value Ref Range Status   Specimen Description BLOOD RIGHT HAND  Final   Special Requests IN PEDIATRIC BOTTLE 1CC  Final   Culture NO GROWTH 2 DAYS  Final   Report Status PENDING  Incomplete  Blood Culture (routine x 2)     Status: None (Preliminary result)    Collection Time: 06/14/15  8:40 PM  Result Value Ref Range Status   Specimen Description BLOOD RIGHT HAND  Final   Special Requests IN PEDIATRIC BOTTLE 1CC  Final   Culture NO GROWTH 2 DAYS  Final   Report Status PENDING  Incomplete  MRSA PCR Screening     Status: None   Collection Time: 06/14/15 11:29 PM  Result Value Ref Range Status   MRSA by PCR NEGATIVE NEGATIVE Final    Comment:        The GeneXpert MRSA Assay (FDA approved for NASAL specimens only), is one component of a comprehensive MRSA colonization surveillance program. It is not intended to diagnose MRSA infection nor to guide or monitor treatment for MRSA infections.      Studies: No results found.   Scheduled Meds: . doxercalciferol  3 mcg Intravenous Q M,W,F-HD  . [START ON 06/18/2015] ferric gluconate (FERRLECIT/NULECIT) IV  62.5 mg Intravenous Q Fri-HD  . lactulose  30 g Oral TID  . levothyroxine  50 mcg Oral QAC breakfast  . metoprolol  50 mg Oral BID  . multivitamin  1 tablet Oral QHS  . pantoprazole  40 mg Oral BID  . sodium chloride flush  3 mL Intravenous Q12H   Continuous Infusions:  PRN Meds: hydrOXYzine  Time spent: 30 minutes  Author: Lynden Oxford, MD [facility] Pager: [phone] 06/16/2015 6:16 PM  If 7PM-7AM, please contact night-coverage at [email or web address], password [facility]

## 2015-06-16 NOTE — Progress Notes (Signed)
OT Cancellation Note  Patient Details Name: DORALYN KIRKES MRN: [id number] DOB: 11/29/1945   Cancelled Treatment:    Reason Eval/Treat Not Completed: Patient at procedure or test/ unavailable  Earlie Raveling OTR/L [phone] 06/16/2015, 8:47 AM

## 2015-06-16 NOTE — Progress Notes (Signed)
Patient's blood pressure was 71/36. NP on call notified and an order was placed to hold this evenings dose of lactulose. Patient is asymptomatic at this time. Will continue to monitor the patient closely.  Feliciana Rossetti, RN, BSN

## 2015-06-16 NOTE — Procedures (Signed)
I was present at this dialysis session. I have reviewed the session itself and made appropriate changes.   K low, changet o 4K bath.  More awake, answers orientation to self today. TDC  Sabra Heck  MD 06/16/2015, 8:59 AM

## 2015-06-16 NOTE — Progress Notes (Signed)
Subjective: No complaints.  But very difficult to understand.  Objective: Vital signs in last 24 hours: Temp:  [97.3 F (36.3 C)-98.4 F (36.9 C)] 98.2 F (36.8 C) (03/01 0731) Pulse Rate:  [58-68] 65 (03/01 0731) Resp:  [18-19] 18 (03/01 0731) BP: (100-115)/(43-54) 111/50 mmHg (03/01 0731) SpO2:  [100 %] 100 % (03/01 0731) Weight:  [76.658 kg (169 lb)] 76.658 kg (169 lb) (02/28 2019) Last BM Date: 06/15/15  Intake/Output from previous day: 02/28 0701 - 03/01 0700 In: 150 [P.O.:150] Out: 1175 [Stool:1175] Intake/Output this shift:    General appearance: alert and no distress GI: soft, non-tender; bowel sounds normal; no masses,  no organomegaly  Rectal: See below  Lab Results:  Recent Labs  06/15/15 1850 06/15/15 2206 06/16/15 0402  WBC 2.5* 2.5* 2.1*  HGB 10.7* 12.0 11.0*  HCT 32.1* 36.6 33.6*  PLT 70* 73* 68*   BMET  Recent Labs  06/14/15 1833 06/15/15 0400 06/16/15 0402  NA 134* 138 140  K 4.0 3.9 3.2*  CL 96* 102 106  CO2 23 20* 20*  GLUCOSE 103* 75 88  BUN 13 19 28*  CREATININE 5.17* 6.28* 8.60*  CALCIUM 10.7* 9.7 8.9   LFT  Recent Labs  06/15/15 0400  PROT 7.7  ALBUMIN 3.1*  AST 78*  ALT 39  ALKPHOS 95  BILITOT 1.2   PT/INR  Recent Labs  06/14/15 2040  LABPROT 16.4*  INR 1.31   Hepatitis Panel No results for input(s): HEPBSAG, HCVAB, HEPAIGM, HEPBIGM in the last 72 hours. C-Diff No results for input(s): CDIFFTOX in the last 72 hours. Fecal Lactopherrin No results for input(s): FECLLACTOFRN in the last 72 hours.  Studies/Results: Ct Abdomen Pelvis Wo Contrast  06/14/2015  CLINICAL DATA:  Abdominal pain.  Rectal bleeding. EXAM: CT ABDOMEN AND PELVIS WITHOUT CONTRAST TECHNIQUE: Multidetector CT imaging of the abdomen and pelvis was performed following the standard protocol without IV contrast. COMPARISON:  12/26/2014 FINDINGS: Lower chest: Lung bases are clear. Dialysis catheter tip in the right atrium. Mitral annular  calcifications. Hepatobiliary: Liver is diffusely nodular and compatible with cirrhosis. Gallbladder has been removed. Common bile duct roughly measures 1 cm and similar to the previous examination. Pancreas: No gross abnormality to the pancreas. Spleen: Spleen is prominent and similar to the previous exam. Again noted are enlarged vascular structures around the spleen which drain into the left renal vein. Known varices around the stomach and distal esophagus are poorly characterized on this examination. Adrenals/Urinary Tract: No acute abnormality to the adrenal glands. Bilateral atrophic kidneys compatible with end-stage renal disease. Evidence for at least 2 cysts in the left kidney. No hydronephrosis. Urinary bladder is decompressed. Stomach/Bowel: Difficult to exclude wall thickening in the rectal region. There is no significant pericolonic inflammation. Mild mesenteric edema is nonspecific. No evidence for a bowel obstruction. Vascular/Lymphatic: Atherosclerotic calcifications in the aorta without aneurysm. No significant lymphadenopathy. Reproductive: Again noted is a low-density structure in the left adnexa. This structure now measures up to 3.5 cm and previously measured 2.5 cm. No gross abnormality to the uterus. Other: There is no significant free fluid or ascites. Musculoskeletal: The bony structures are very heterogeneous with patchy areas of sclerosis. Again noted is severe disc disease at L2-L3 with multiple Schmorl's nodes and vacuum phenomenon. IMPRESSION: No acute abnormality within the abdomen or pelvis. Changes compatible with end-stage renal disease. 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 Persistent left adnexal cystic structure. However, this structure may be enlarging. Consider a non-emergent pelvic ultrasound to  ensure that this has benign characteristics in a postmenopausal female. 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 Electronically Signed   By: Richarda Overlie M.D.   On: 06/14/2015  21:16   Ct Head Wo Contrast  06/14/2015  CLINICAL DATA:  Altered mental status.  Unsteady gait.  Confusion. EXAM: CT HEAD WITHOUT CONTRAST TECHNIQUE: Contiguous axial images were obtained from the base of the skull through the vertex without intravenous contrast. COMPARISON:  None. FINDINGS: Sinuses/Soft tissues: renal osteodystrophy. Clear paranasal sinuses and mastoid air cells. Intracranial: Age advanced cerebral atrophy. Moderate low density in the periventricular white matter likely related to small vessel disease. No mass lesion, hemorrhage, hydrocephalus, acute infarct, intra-axial, or extra-axial fluid collection. IMPRESSION: 1.  No acute intracranial abnormality. 2.  Cerebral atrophy and small vessel ischemic change. Electronically Signed   By: Jeronimo Greaves M.D.   On: 06/14/2015 21:05   US Pelvis Complete  06/15/2015  CLINICAL DATA:  Left adnexal mass on recent CT EXAM: TRANSABDOMINAL ULTRASOUND OF PELVIS TECHNIQUE: Transabdominal ultrasound examination of the pelvis was performed including evaluation of the uterus, ovaries, adnexal regions, and pelvic cul-de-sac. COMPARISON:  CT abdomen and pelvis June 14, 2015 FINDINGS: Uterus Measurements: 7.2 x 3.7 x 4.3 cm. No fibroids or other mass visualized. Endometrium Thickness: 17 mm. Endometrium appears irregular in contour. There is inhomogeneous echotexture within the endometrium with a questionable 6 mm polyp in the mid endometrial region. Right ovary Measurements: 2.5 x 1.7 x 2.0 cm. Normal appearance/no adnexal mass. Left ovary Measurements: 5.1 x 2.9 x 4.1 cm. There is a cystic structure arising from the left ovary with mildly thickened wall measuring 2.1 x 3.3 x 2.0 cm. Other findings:  No abnormal free fluid. IMPRESSION: Thickened endometrium for age with irregular contour and questionable mid endometrial polyp. Endometrial thickness is considered abnormal for an asymptomatic post-menopausal female. Endometrial sampling should be considered to  exclude carcinoma. Cystic structure with mildly thickened wall measuring 2.1 x 3.3 x 2.0 cm arising from left ovary region. Question ovarian cyst or possibly ovarian cystadenoma. As this structure cannot be classified as a simple ovarian cyst, a followup pelvic ultrasound in approximately 6 months is advised to assess for stability. These results will be called to the ordering clinician or representative by the Radiologist Assistant, and communication documented in the PACS or zVision Dashboard. Electronically Signed   By: Bretta Bang III M.D.   On: 06/15/2015 15:29   Dg Chest Port 1 View  06/14/2015  CLINICAL DATA:  70 year old female with altered mental status and intermittent confusion and unsteady gait EXAM: PORTABLE CHEST 1 VIEW COMPARISON:  Chest radiograph dated 01/12/2015 FINDINGS: Single-view of the chest does not demonstrate focal consolidation. There is no pleural effusion or pneumothorax. There are mild diffuse vascular interstitial prominence, likely mild congestive changes. There is stable mild cardiomegaly. Eight dwell lumen dialysis catheter is noted in stable position. The degenerative changes of the osseous structures with bilateral shoulder arthroplasties. No acute fracture. IMPRESSION: Stable appearing minimal congestive changes. No focal consolidation. Electronically Signed   By: Elgie Collard M.D.   On: 06/14/2015 20:06    Medications:  Scheduled: . doxercalciferol  3 mcg Intravenous Q M,W,F-HD  . [START ON 06/18/2015] ferric gluconate (FERRLECIT/NULECIT) IV  62.5 mg Intravenous Q Fri-HD  . lactulose  30 g Oral TID  . levothyroxine  50 mcg Oral QAC breakfast  . metoprolol  50 mg Oral BID  . multivitamin  1 tablet Oral QHS  . pantoprazole (PROTONIX) IV  40 mg Intravenous  Q12H  . sodium chloride flush  3 mL Intravenous Q12H   Continuous:   Assessment/Plan: 1) Hematochezia. 2) Cirrhosis. 3) Hepatic encephalopathy. 4) ESRD. 5) Left adenxal cystic lesion. 6) Hearing  difficulties.   The patient's rectal examination was negative for any blood, fissures, or masses.  Her HGB is essentially stable since admission.  No reports of hematemesis or any complaints of diarrhea.  It is difficult to understand her speech, but she seems more oriented today.  However, my perceived disorientation may be as a result of her poor hearing.    Plan: 1) No further GI evaluation at this time. 2) She can follow up in the office in 2-4 weeks as she will need to be scheduled for an outpatient EGD to survey her esophageal varices. 3) Continue with lactulose. 4) Check routine AFP here in the hospital.    LOS: 2 days   Stanly Si D 06/16/2015, 7:53 AM

## 2015-06-17 LAB — BASIC METABOLIC PANEL
Anion gap: 15 (ref 5–15)
BUN: 14 mg/dL (ref 6–20)
CHLORIDE: 99 mmol/L — AB (ref 101–111)
CO2: 20 mmol/L — AB (ref 22–32)
Calcium: 8.9 mg/dL (ref 8.9–10.3)
Creatinine, Ser: 6.05 mg/dL — ABNORMAL HIGH (ref 0.44–1.00)
GFR calc Af Amer: 7 mL/min — ABNORMAL LOW (ref >60)
GFR calc non Af Amer: 6 mL/min — ABNORMAL LOW (ref >60)
Glucose, Bld: 80 mg/dL (ref 65–99)
POTASSIUM: 3.8 mmol/L (ref 3.5–5.1)
SODIUM: 134 mmol/L — AB (ref 135–145)

## 2015-06-17 LAB — PROTIME-INR
INR: 1.3 (ref 0.00–1.49)
PROTHROMBIN TIME: 16.3 s — AB (ref 11.6–15.2)

## 2015-06-17 LAB — CBC WITH DIFFERENTIAL/PLATELET
Basophils Absolute: 0 10*3/uL (ref 0.0–0.1)
Basophils Relative: 1 %
EOS ABS: 0.3 10*3/uL (ref 0.0–0.7)
Eosinophils Relative: 9 %
HEMATOCRIT: 34.3 % — AB (ref 36.0–46.0)
HEMOGLOBIN: 11.5 g/dL — AB (ref 12.0–15.0)
LYMPHS ABS: 1 10*3/uL (ref 0.7–4.0)
LYMPHS PCT: 36 %
MCH: 31.9 pg (ref 26.0–34.0)
MCHC: 33.5 g/dL (ref 30.0–36.0)
MCV: 95 fL (ref 78.0–100.0)
Monocytes Absolute: 0.4 10*3/uL (ref 0.1–1.0)
Monocytes Relative: 13 %
NEUTROS ABS: 1.1 10*3/uL — AB (ref 1.7–7.7)
NEUTROS PCT: 40 %
Platelets: 65 10*3/uL — ABNORMAL LOW (ref 150–400)
RBC: 3.61 MIL/uL — AB (ref 3.87–5.11)
RDW: 16.2 % — ABNORMAL HIGH (ref 11.5–15.5)
WBC: 2.8 10*3/uL — AB (ref 4.0–10.5)

## 2015-06-17 LAB — GLUCOSE, CAPILLARY: Glucose-Capillary: 76 mg/dL (ref 65–99)

## 2015-06-17 LAB — AFP TUMOR MARKER: AFP-Tumor Marker: 4.1 ng/mL (ref 0.0–8.3)

## 2015-06-17 LAB — CBC AND DIFFERENTIAL: WBC: 2.8 10^3/mL

## 2015-06-17 MED ORDER — MIDODRINE HCL 5 MG PO TABS
5.0000 mg | ORAL_TABLET | Freq: Once | ORAL | Status: AC
  Administered 2015-06-17: 5 mg via ORAL
  Filled 2015-06-17: qty 1

## 2015-06-17 MED ORDER — SODIUM CHLORIDE 0.9 % IV BOLUS (SEPSIS)
500.0000 mL | Freq: Once | INTRAVENOUS | Status: AC
  Administered 2015-06-17: 500 mL via INTRAVENOUS

## 2015-06-17 MED ORDER — LACTULOSE 10 GM/15ML PO SOLN
30.0000 g | Freq: Two times a day (BID) | ORAL | Status: DC
  Administered 2015-06-18 – 2015-06-19 (×3): 30 g via ORAL
  Filled 2015-06-17 (×4): qty 45

## 2015-06-17 NOTE — Progress Notes (Signed)
BP 82/40 after 500 cc bolus of NS administered.  Asymptomatic. Notified Claiborne Billings, NP.  Awaiting call back/further orders. Will continue to monitor.

## 2015-06-17 NOTE — Care Management Important Message (Signed)
Important Message  Patient Details  Name: Samantha [name] MRN: [id number] Date of Birth: [birth year]   Medicare Important Message Given:  Yes    Clydell Sposito P Lya Holben 06/17/2015, 2:18 PM

## 2015-06-17 NOTE — Progress Notes (Signed)
Physical Therapy Treatment Patient Details Name: Samantha [name] MRN: [id number] DOB: 11-Jun-1945 Today's Date: 06/17/2015    History of Present Illness 70 y.o. female with PMH of hypertension, hypothyroidism, ESRD-HD, headache, dizziness, occasional tingling sensation in fingers, gastric ulcer, GI bleeding, hepatitis B, diastolic congestive heart failure, chronic leukopenia, who presents with altered mental status, abdominal pain and rectal bleeding    PT Comments    Making progress towards functional goals. Required physical assist for balance. Very pleasant, hard of hearing but eager to work with therapy.Patient will continue to benefit from skilled physical therapy services to further improve independence with functional mobility.   Follow Up Recommendations  SNF;Supervision/Assistance - 24 hour (may consider HHPT if family to provide adequate assist)     Equipment Recommendations  None recommended by PT    Recommendations for Other Services       Precautions / Restrictions Precautions Precautions: Fall Restrictions Weight Bearing Restrictions: No    Mobility  Bed Mobility Overal bed mobility: Modified Independent             General bed mobility comments: Extra time in/out of bed. No physical assist needed  Transfers Overall transfer level: Needs assistance Equipment used: Rolling walker (2 wheeled) Transfers: Sit to/from Stand Sit to Stand: Min guard         General transfer comment: Min guard for safety. Good strength to power up without physical assist. Performed from bed and BSC  Ambulation/Gait Ambulation/Gait assistance: Min assist Ambulation Distance (Feet): 115 Feet Assistive device: Rolling walker (2 wheeled) Gait Pattern/deviations: Step-through pattern;Decreased stride length;Trunk flexed Gait velocity: decreased Gait velocity interpretation: Below normal speed for age/gender General Gait Details: Intermittent cues for forward gaze and walker  placement with turns. One episode of loss of balance while using RW requiring min assist to correct balance. VC for awareness and to keep RW place firmly on ground rather than lift.   Stairs            Wheelchair Mobility    Modified Rankin (Stroke Patients Only)       Balance                                    Cognition Arousal/Alertness: Awake/alert Behavior During Therapy: WFL for tasks assessed/performed Overall Cognitive Status: No family/caregiver present to determine baseline cognitive functioning                      Exercises      General Comments General comments (skin integrity, edema, etc.): Loose bowel movement in [facility]      Pertinent Vitals/Pain Pain Assessment: No/denies pain    Home Living                      Prior Function            PT Goals (current goals can now be found in the care plan section) Acute Rehab PT Goals Patient Stated Goal: to be able to eat PT Goal Formulation: With patient Time For Goal Achievement: 06/29/15 Potential to Achieve Goals: Good Progress towards PT goals: Progressing toward goals    Frequency  Min 3X/week    PT Plan Current plan remains appropriate    Co-evaluation             End of Session Equipment Utilized During Treatment: Gait belt Activity Tolerance: Patient tolerated treatment well Patient left: with  call bell/phone within reach;in bed;with bed alarm set     Time: 6295-2841 PT Time Calculation (min) (ACUTE ONLY): 21 min  Charges:  $Gait Training: 8-22 mins                    G Codes:      Berton Mount 2015-07-06, 2:33 PM  [facility] [place], [place] [phone]

## 2015-06-17 NOTE — Progress Notes (Signed)
BP 84/38 after another 500 cc bolus.  Notified Claiborne Billings, NP.  Will continue to monitor.

## 2015-06-17 NOTE — Progress Notes (Signed)
[facility] Progress Note   Dialysis Orders: [facility] 4 hr 160 400//A1.5 2 K 3.5 Ca profile 4 low temp heparin 2000 EDW 76.5 venofer 50 last 2/24 Mircera 75 q 4 weeks last 2/15  Recent labs; Hgb 11.4 37%sat ferritin 506 Ca/P ok iPTH 272  Assessment/Plan: 1. AMS/cirrhosis - elevated NH3 73, AST up - AMS likely secondary to liver dz, no  infectious source identificed yet;  pt cultured- on Vanc and Zosyn; lactulose caused diarrhea, but MS at baseline  2. Rectal bleeding - follow CBC, hold heparin on HD - per primary 3. ESRD - MWF - HD Friday If she is to continue dialysis, she needs a permanent access which will be addressed at the outpt unit 4. Hypotension/volume - BP much lower here than outpt HD -wonder if she really was taking MTP and norvasc at home.  BP are about half of what they are at outpt unit and pt is totally asymptomatic.- off all BP meds- given midodrine today; UF 2 L yesterday - post 74.5 below EDW- check orthostatics pre and post HD 5. Anemia - Hgb 11.5 - watch - ESA due for redose mid March- may need to redose sooner 6. Metabolic bone disease - Prone to hypocalcemia - requires added Ca bath; hectorol; no binders or sensipar;  7. Nutrition -renal diet 8. Pancytopenia - chronic -follow- stable, no heparin but can use prn if needed for catheter flow 9. Hypothyroidism - TSH 5 on synthroid 10. DNR 11. L2-3 disc dz per CT  Sheffield Slider, PA-C [facility] Beeper [phone] 06/17/2015,8:28 AM  LOS: 3 days   Subjective:   Vomited yesterday . Feels much better today  Objective Filed Vitals:   06/16/15 1703 06/16/15 2127 06/16/15 2333 06/17/15 0519  BP: 83/44  Pulse: 53 70 73 74  Temp: 98.8 F (37.1 C) 98.3 F (36.8 C)  98.8 F (37.1 C)  TempSrc: Oral Oral  Oral  Resp: Height:      Weight:      SpO2: 100% 100%  99%   Physical Exam General: NAD eating breakfast Heart: RRR Lungs: no rales Abdomen: obese  soft Extremities: obese no overt edema Dialysis Access: left IJ  Additional Objective Labs: . Basic Metabolic Panel:  Recent Labs Lab 06/14/15 1833 06/15/15 0400 06/16/15 0402  NA 134* 138 140  K 4.0 3.9 3.2*  CL 96* 102 106  CO2 23 20* 20*  GLUCOSE 103* 75 88  BUN 13 19 28*  CREATININE 5.17* 6.28* 8.60*  CALCIUM 10.7* 9.7 8.9   Liver Function Tests:  Recent Labs Lab 06/14/15 1833 06/15/15 0400  AST 69* 78*  ALT 35 39  ALKPHOS 112 95  BILITOT 1.0 1.2  PROT 9.1* 7.7  ALBUMIN 3.5 3.1*  CBC:  Recent Labs Lab 06/14/15 1833  06/15/15 1850 06/15/15 2206 06/16/15 0402 06/16/15 1547 06/17/15 0715  WBC 2.4*  < > 2.5* 2.5* 2.1* 2.5* 2.8*  NEUTROABS 1.4*  --   --   --   --   --  1.1*  HGB 12.4  < > 10.7* 12.0 11.0* 12.9 11.5*  HCT 36.2  < > 32.1* 36.6 33.6* 38.4 34.3*  MCV 92.6  < > 93.9 95.6 95.5 94.3 95.0  PLT 89*  < > 70* 73* 68* 71* 65*  < > = values in this interval not displayed. Blood Culture    Component Value Date/Time   SDES BLOOD RIGHT HAND 06/14/2015 2040  SPECREQUEST IN PEDIATRIC BOTTLE 1CC 06/14/2015 2040   CULT NO GROWTH 2 DAYS 06/14/2015 2040   REPTSTATUS PENDING 06/14/2015 2040   CBG:  Recent Labs Lab 06/15/15 0741 06/17/15 0739  GLUCAP 76 76    Studies/Results: US Pelvis Complete  06/15/2015  CLINICAL DATA:  Left adnexal mass on recent CT EXAM: TRANSABDOMINAL ULTRASOUND OF PELVIS TECHNIQUE: Transabdominal ultrasound examination of the pelvis was performed including evaluation of the uterus, ovaries, adnexal regions, and pelvic cul-de-sac. COMPARISON:  CT abdomen and pelvis June 14, 2015 FINDINGS: Uterus Measurements: 7.2 x 3.7 x 4.3 cm. No fibroids or other mass visualized. Endometrium Thickness: 17 mm. Endometrium appears irregular in contour. There is inhomogeneous echotexture within the endometrium with a questionable 6 mm polyp in the mid endometrial region. Right ovary Measurements: 2.5 x 1.7 x 2.0 cm. Normal appearance/no  adnexal mass. Left ovary Measurements: 5.1 x 2.9 x 4.1 cm. There is a cystic structure arising from the left ovary with mildly thickened wall measuring 2.1 x 3.3 x 2.0 cm. Other findings:  No abnormal free fluid. IMPRESSION: Thickened endometrium for age with irregular contour and questionable mid endometrial polyp. Endometrial thickness is considered abnormal for an asymptomatic post-menopausal female. Endometrial sampling should be considered to exclude carcinoma. Cystic structure with mildly thickened wall measuring 2.1 x 3.3 x 2.0 cm arising from left ovary region. Question ovarian cyst or possibly ovarian cystadenoma. As this structure cannot be classified as a simple ovarian cyst, a followup pelvic ultrasound in approximately 6 months is advised to assess for stability. These results will be called to the ordering clinician or representative by the Radiologist Assistant, and communication documented in the PACS or zVision Dashboard. Electronically Signed   By: Bretta Bang III M.D.   On: 06/15/2015 15:29   Medications:   . doxercalciferol  3 mcg Intravenous Q M,W,F-HD  . [START ON 06/18/2015] ferric gluconate (FERRLECIT/NULECIT) IV  62.5 mg Intravenous Q Fri-HD  . lactulose  30 g Oral TID  . levothyroxine  50 mcg Oral QAC breakfast  . midodrine  5 mg Oral Once  . multivitamin  1 tablet Oral QHS  . nadolol  20 mg Oral Daily  . pantoprazole  40 mg Oral BID  . sodium chloride flush  3 mL Intravenous Q12H

## 2015-06-17 NOTE — Evaluation (Signed)
Occupational Therapy Evaluation Patient Details Name: Samantha [name] MRN: [id number] DOB: 1945/09/27 Today's Date: 06/17/2015    History of Present Illness 70 y.o. female with PMH of hypertension, hypothyroidism, ESRD-HD, headache, dizziness, occasional tingling sensation in fingers, gastric ulcer, GI bleeding, hepatitis B, diastolic congestive heart failure, chronic leukopenia, who presents with altered mental status, abdominal pain and rectal bleeding   Clinical Impression   Patient presenting with decreased ADL and functional mobility independence secondary to above. Patient independent to mod I PTA. Patient currently functioning at an overall min guard to min assist level. Patient will benefit from acute OT to increase overall independence in the areas of ADLs, functional mobility, and overall safety in order to safely discharge to venue listed below.     Follow Up Recommendations  SNF;Supervision/Assistance - 24 hour    Equipment Recommendations  Other (comment) (TBD)    Recommendations for Other Services  None at this time    Precautions / Restrictions Precautions Precautions: Fall Restrictions Weight Bearing Restrictions: No    Mobility Bed Mobility Overal bed mobility: Needs Assistance Bed Mobility: Sit to Supine       Sit to supine: Supervision   General bed mobility comments: Supervision for safety   Transfers Overall transfer level: Needs assistance Equipment used: Rolling walker (2 wheeled) Transfers: Sit to/from Stand Sit to Stand: Min guard   General transfer comment: Min guard for safety. Pt performed sit to/from stand from recliner and elevated toilet seat in BR using grab bars.     Balance Overall balance assessment: Needs assistance;History of Falls Sitting-balance support: No upper extremity supported;Feet supported Sitting balance-Leahy Scale: Fair     Standing balance support: Bilateral upper extremity supported;During functional  activity Standing balance-Leahy Scale: Fair Standing balance comment: using RW for assistance to steady    ADL Overall ADL's : Needs assistance/impaired Eating/Feeding: Set up;Sitting   Grooming: Min guard;Standing   Upper Body Bathing: Set up;Sitting   Lower Body Bathing: Minimal assistance;Sit to/from stand   Upper Body Dressing : Set up;Sitting   Lower Body Dressing: Minimal assistance;Sit to/from stand   Toilet Transfer: Minimal assistance;RW;Ambulation;BSC;Grab bars   Toileting- Architect and Hygiene: Min guard;Sit to/from Nurse, children's Details (indicate cue type and reason): did not occur Functional mobility during ADLs: Min guard;Minimal assistance;Cueing for safety;Cueing for sequencing;Rolling walker General ADL Comments: Pt very HOH and required multimodal cueing for safety.      Vision Additional Comments: no change from baseline          Pertinent Vitals/Pain Pain Assessment: Faces Pain Score: 0-No pain Faces Pain Scale: No hurt     Hand Dominance Right   Extremity/Trunk Assessment Upper Extremity Assessment Upper Extremity Assessment: Generalized weakness   Lower Extremity Assessment Lower Extremity Assessment: Defer to PT evaluation  Communication Communication Communication: HOH   Cognition Arousal/Alertness: Awake/alert Behavior During Therapy: WFL for tasks assessed/performed Overall Cognitive Status: Difficult to assess              Home Living Family/patient expects to be discharged to:: Private residence Living Arrangements: Other relatives Available Help at Discharge: Family;Available PRN/intermittently Type of Home: House Home Access: Level entry     Home Layout: Two level;Able to live on main level with bedroom/bathroom     Bathroom Shower/Tub: Chief Strategy Officer: Standard     Home Equipment: Cane - single point (walking stick)   Additional Comments: above was taken per PT  eval, difficult to gather information due  to patient very HOH      Prior Functioning/Environment Level of Independence: Independent with assistive device(s)     OT Diagnosis: Generalized weakness   OT Problem List: Decreased strength;Decreased activity tolerance;Impaired balance (sitting and/or standing);Decreased safety awareness;Decreased knowledge of use of DME or AE;Decreased knowledge of precautions   OT Treatment/Interventions: Self-care/ADL training;Therapeutic exercise;Energy conservation;DME and/or AE instruction;Therapeutic activities;Patient/family education;Balance training    OT Goals(Current goals can be found in the care plan section) Acute Rehab OT Goals Patient Stated Goal: none stated OT Goal Formulation: Patient unable to participate in goal setting (pt very HOH) Time For Goal Achievement: 07/01/15 Potential to Achieve Goals: Good ADL Goals Pt Will Perform Grooming: standing;with modified independence (at sink) Pt Will Perform Lower Body Bathing: sit to/from stand;with set-up Pt Will Perform Lower Body Dressing: with set-up;sit to/from stand Pt Will Transfer to Toilet: ambulating;bedside commode;with supervision Additional ADL Goal #1: Pt will be supervision for functional ambulation during ADLs using LRAD  OT Frequency: Min 2X/week   Barriers to D/C: Decreased caregiver support   End of Session Equipment Utilized During Treatment: Gait belt;Rolling walker  Activity Tolerance: Patient tolerated treatment well Patient left: in bed;with call bell/phone within reach;with bed alarm set   Time: 1610-9604 OT Time Calculation (min): 23 min Charges:  OT General Charges $OT Visit: 1 Procedure OT Evaluation $OT Eval Moderate Complexity: 1 Procedure OT Treatments $Self Care/Home Management : 8-22 mins  Edwin Cap , MS, OTR/L, CLT Pager: [phone]  06/17/2015, 10:29 AM

## 2015-06-17 NOTE — Progress Notes (Signed)
[facility] Progress Note  Patient: Samantha [name] ZOX:[id number]   PCP: Dorrene German, MD DOB: [birth year]   DOA: 06/14/2015   DOS: 06/17/2015   Date of Service: the patient was seen and examined on 06/17/2015  Subjective: Complained of right-sided pancreatic last night after eating. Does not have any acute pain at this time of my evaluation no nausea no vomiting and no diarrhea. She has been tolerating oral diet. And despite her blood pressure on lower side she was asymptomatic  Assessment and Plan: 1. Acute encephalopathy Most likely hepatic encephalopathy Ammonia level elevated on admission. Currently getting better. No asterixis on examination. Continue current management. We will reduce the lactulose dose to once a day  2. ESRD on hemodialysis. Tolerating hemodialysis well. Nephrology will continue to follow. Her current hypotension is most likely secondary to increased fluid removal with hemodialysis. We will give her bolus and also give her midodrine  3. Questionable rectal bleeding with the anemia of chronic kidney disease. H&H remains stable. GI was consulted and recommended outpatient follow-up.  4. Abnormal ultrasound of the pelvis. Endometrial thickness. Left ovarian cyst. Patient does have increase endometrial thickness which is abnormal for her age. If the patient continues to have complaints of rectal bleeding she may have pelvic bleeding and may require a biopsy. Currently her H&H remained stable at present I would recommend the patient to follow-up with OB/GYN as an outpatient for both left ovarian cyst as well as endometrial thickness. Currently I do not have any etiology regarding her intermittent right-sided abdominal pain.  5. Cirrhosis of the liver with thrombocytopenia. History of hepatitis B. Continue lactulose. Continue PPI. Due to low blood pressure currently holding nadolol Getting hemodialysis for volume overload.  6. Abdominal pain. Patient  is a right flank pain. CT of the abdomen on admission was unremarkable. Patient has left-sided adnexal mass and left-sided cystic structure. At present we'll continue to closely monitor.  7. Elevated lactic acidosis. Sepsis ruled out. The patient does not have any evidence of infection. Lactic acid elevation is likely in the setting of liver failure.  DVT Prophylaxis: mechanical compression device Nutrition: Renal diet Advance goals of care discussion: DNR/DNI  Brief Summary of Hospitalization:  HPI: As per the H and P dictated on admission, "Samantha [name] is a 70 y.o. female with PMH of hypertension, hypothyroidism, ESRD-HD, headache, dizziness, occasional tingling sensation in fingers, gastric ulcer, GI bleeding, hepatitis B, diastolic congestive heart failure, chronic leukopenia, who presents with altered mental status, abdominal pain and rectal bleeding.  Per family, patient has been confused in the past 3 days. Her abdominal pain is located in the left lower quadrant. She has diarrhea and mild rectal bleeding with bright right blood in her and nowhere per her grand daughter. They cannot provide detailed inflammations. Patient also has dry cough which is some mild, no fever, chills, shortness of breath or chest pain. No runny nose or sore throat. Patient has been compliant to her dialysis, last dialysis was today. Per her granddaughter, her AVF is not functioning, but the patient refused to left surgeon to put a new AV fistular for her. She is using port-line for dialysis now. Patient does not have nausea, vomiting, diarrhea, unilateral weakness.  In ED, patient was found to have elevated ammonia and 73, positive FOBT, WBC 2.4 which was 2.5 on 12/28/14, thrombocytopenia with platelet 89, lactate 3.51--> 3.89, temperature normal, no tachycardia, creatinine 5.17, BUN 13, bicarbonate 24, normal potassium. Chest x-ray is negative for infiltration. CT  head is negative for acute intracranial  abnormalities. CT abdomen/pelvis showed no acute abnormality within the abdomen or pelvis; cirrhosis with evidence of portal hypertension and varices, but no significant free fluid or ascites, persistent left adnexal cystic structure. However, this structure may be enlarging. Consider a non-emergent pelvic ultrasound to ensure that this has benign characteristics in a postmenopausal female." Daily update, Procedures: Hemodialysis Consultants: Nephrology and gastroenterology Antibiotics: Anti-infectives    Start     Dose/Rate Route Frequency Ordered Stop   06/15/15 0400  piperacillin-tazobactam (ZOSYN) IVPB 2.25 g  Status:  Discontinued     2.25 g 100 mL/hr over 30 Minutes Intravenous 3 times per day 06/14/15 2121 06/15/15 1345   06/14/15 2000  piperacillin-tazobactam (ZOSYN) IVPB 3.375 g     3.375 g 100 mL/hr over 30 Minutes Intravenous  Once 06/14/15 1951 06/14/15 2128   06/14/15 2000  vancomycin (VANCOCIN) IVPB 1000 mg/200 mL premix  Status:  Discontinued     1,000 mg 200 mL/hr over 60 Minutes Intravenous  Once 06/14/15 1951 06/14/15 1953   06/14/15 2000  vancomycin (VANCOCIN) 1,500 mg in sodium chloride 0.9 % 500 mL IVPB     1,500 mg 250 mL/hr over 120 Minutes Intravenous  Once 06/14/15 1953 06/15/15 0139      Family Communication: no family was present at bedside, at the time of interview.   Disposition:  Expected discharge date: 06/18/2015 Barriers to safe discharge: Stabilization of the blood pressure   Intake/Output Summary (Last 24 hours) at 06/17/15 1626 Last data filed at 06/17/15 1335  Gross per 24 hour  Intake   1800 ml  Output      0 ml  Net   1800 ml   Filed Weights   06/15/15 2019 06/16/15 0846 06/16/15 1300  Weight: 76.658 kg (169 lb) 77.2 kg (170 lb 3.1 oz) 74.9 kg (165 lb 2 oz)    Objective: Physical Exam: Filed Vitals:   06/17/15 0519 06/17/15 0934 06/17/15 1015 06/17/15 1613  BP: 82/44  Pulse: 74 57  68  Temp: 98.8 F (37.1 C) 98 F  (36.7 C)  98.6 F (37 C)  TempSrc: Oral Oral  Oral  Resp: Height:      Weight:      SpO2: 99% 99%  99%    General: Appear in mild distress, no Rash; Oral Mucosa moist Cardiovascular: S1 and S2 Present, no Murmur, no JVD Respiratory: Bilateral Air entry present and Clear to Auscultation, no Crackles, no wheezes Abdomen: Bowel Sound present, Soft and no tenderness Extremities: no Pedal edema, no calf tenderness Neurology: Grossly no focal neuro deficit.  Data Reviewed: CBC:  Recent Labs Lab 06/14/15 1833  06/15/15 1850 06/15/15 2206 06/16/15 0402 06/16/15 1547 06/17/15 0715  WBC 2.4*  < > 2.5* 2.5* 2.1* 2.5* 2.8*  NEUTROABS 1.4*  --   --   --   --   --  1.1*  HGB 12.4  < > 10.7* 12.0 11.0* 12.9 11.5*  HCT 36.2  < > 32.1* 36.6 33.6* 38.4 34.3*  MCV 92.6  < > 93.9 95.6 95.5 94.3 95.0  PLT 89*  < > 70* 73* 68* 71* 65*  < > = values in this interval not displayed. Basic Metabolic Panel:  Recent Labs Lab 06/14/15 1833 06/15/15 0400 06/16/15 0402 06/17/15 0715  NA 134* 138 140 134*  K 4.0 3.9 3.2* 3.8  CL 96* 102 106 99*  CO2 23 20* 20* 20*  GLUCOSE  103* 75 88 80  BUN 13 19 28* 14  CREATININE 5.17* 6.28* 8.60* 6.05*  CALCIUM 10.7* 9.7 8.9 8.9   Liver Function Tests:  Recent Labs Lab 06/14/15 1833 06/15/15 0400  AST 69* 78*  ALT 35 39  ALKPHOS 112 95  BILITOT 1.0 1.2  PROT 9.1* 7.7  ALBUMIN 3.5 3.1*   No results for input(s): LIPASE, AMYLASE in the last 168 hours.  Recent Labs Lab 06/14/15 2051  AMMONIA 73*    Cardiac Enzymes: No results for input(s): CKTOTAL, CKMB, CKMBINDEX, TROPONINI in the last 168 hours.  BNP (last 3 results) No results for input(s): BNP in the last 8760 hours.  CBG:  Recent Labs Lab 06/15/15 0741 06/17/15 0739  GLUCAP 76 76    Recent Results (from the past 240 hour(s))  Blood Culture (routine x 2)     Status: None (Preliminary result)   Collection Time: 06/14/15  8:35 PM  Result Value Ref Range Status     Specimen Description BLOOD RIGHT HAND  Final   Special Requests IN PEDIATRIC BOTTLE 1CC  Final   Culture NO GROWTH 3 DAYS  Final   Report Status PENDING  Incomplete  Blood Culture (routine x 2)     Status: None (Preliminary result)   Collection Time: 06/14/15  8:40 PM  Result Value Ref Range Status   Specimen Description BLOOD RIGHT HAND  Final   Special Requests IN PEDIATRIC BOTTLE 1CC  Final   Culture NO GROWTH 3 DAYS  Final   Report Status PENDING  Incomplete  MRSA PCR Screening     Status: None   Collection Time: 06/14/15 11:29 PM  Result Value Ref Range Status   MRSA by PCR NEGATIVE NEGATIVE Final    Comment:        The GeneXpert MRSA Assay (FDA approved for NASAL specimens only), is one component of a comprehensive MRSA colonization surveillance program. It is not intended to diagnose MRSA infection nor to guide or monitor treatment for MRSA infections.     Studies: No results found.   Scheduled Meds: . doxercalciferol  3 mcg Intravenous Q M,W,F-HD  . [START ON 06/18/2015] ferric gluconate (FERRLECIT/NULECIT) IV  62.5 mg Intravenous Q Fri-HD  . lactulose  30 g Oral BID  . levothyroxine  50 mcg Oral QAC breakfast  . multivitamin  1 tablet Oral QHS  . pantoprazole  40 mg Oral BID  . sodium chloride  500 mL Intravenous Once  . sodium chloride flush  3 mL Intravenous Q12H   Continuous Infusions:  PRN Meds:   Time spent: 30 minutes  Author: Lynden Oxford, MD [facility] Pager: [phone] 06/17/2015 4:26 PM  If 7PM-7AM, please contact night-coverage at [email or web address], password [email or web address]

## 2015-06-17 NOTE — Consult Note (Signed)
   [facility] CM Inpatient Consult   06/17/2015  Samantha [name] 1945-05-20 [id number]   Patient screened for [facility] Care Management program. Noted patient's Primary Care MD is Dr. Concepcion Elk. Patient not eligible for [facility] Care Management at this time due to Primary Care MD is no longer at [facility] Provider. Made inpatient RNCM aware.  Raiford Noble, MSN-Ed, RN,BSN [facility] Liaison [phone]

## 2015-06-18 DIAGNOSIS — Z992 Dependence on renal dialysis: Secondary | ICD-10-CM

## 2015-06-18 DIAGNOSIS — E872 Acidosis, unspecified: Secondary | ICD-10-CM | POA: Insufficient documentation

## 2015-06-18 DIAGNOSIS — K922 Gastrointestinal hemorrhage, unspecified: Secondary | ICD-10-CM | POA: Insufficient documentation

## 2015-06-18 DIAGNOSIS — N186 End stage renal disease: Secondary | ICD-10-CM | POA: Insufficient documentation

## 2015-06-18 DIAGNOSIS — N949 Unspecified condition associated with female genital organs and menstrual cycle: Secondary | ICD-10-CM

## 2015-06-18 DIAGNOSIS — N9489 Other specified conditions associated with female genital organs and menstrual cycle: Secondary | ICD-10-CM | POA: Insufficient documentation

## 2015-06-18 LAB — RENAL FUNCTION PANEL
ANION GAP: 14 (ref 5–15)
Albumin: 2.8 g/dL — ABNORMAL LOW (ref 3.5–5.0)
BUN: 25 mg/dL — ABNORMAL HIGH (ref 6–20)
CHLORIDE: 96 mmol/L — AB (ref 101–111)
CO2: 19 mmol/L — AB (ref 22–32)
CREATININE: 8.7 mg/dL — AB (ref 0.44–1.00)
Calcium: 8.6 mg/dL — ABNORMAL LOW (ref 8.9–10.3)
GFR, EST AFRICAN AMERICAN: 5 mL/min — AB (ref >60)
GFR, EST NON AFRICAN AMERICAN: 4 mL/min — AB (ref >60)
Glucose, Bld: 67 mg/dL (ref 65–99)
POTASSIUM: 3 mmol/L — AB (ref 3.5–5.1)
Phosphorus: 4.9 mg/dL — ABNORMAL HIGH (ref 2.5–4.6)
Sodium: 129 mmol/L — ABNORMAL LOW (ref 135–145)

## 2015-06-18 LAB — GLUCOSE, CAPILLARY: GLUCOSE-CAPILLARY: 85 mg/dL (ref 65–99)

## 2015-06-18 LAB — BASIC METABOLIC PANEL
BUN: 25 mg/dL — AB (ref 4–21)
Creatinine: 8.7 mg/dL — AB (ref 0.5–1.1)
Glucose: 67 mg/dL
Sodium: 129 mmol/L — AB (ref 137–147)

## 2015-06-18 MED ORDER — PENTAFLUOROPROP-TETRAFLUOROETH EX AERO: 1.0000 "application " | INHALATION_SPRAY | CUTANEOUS | Status: DC | PRN

## 2015-06-18 MED ORDER — ALTEPLASE 2 MG IJ SOLR: 2.0000 mg | Freq: Once | INTRAMUSCULAR | Status: DC | PRN

## 2015-06-18 MED ORDER — MIDODRINE HCL 5 MG PO TABS
10.0000 mg | ORAL_TABLET | ORAL | Status: DC | PRN
  Administered 2015-06-18: 10 mg via ORAL

## 2015-06-18 MED ORDER — LACTULOSE 10 GM/15ML PO SOLN: 30.0000 g | Freq: Two times a day (BID) | ORAL | Status: DC

## 2015-06-18 MED ORDER — SODIUM CHLORIDE 0.9 % IV SOLN: 100.0000 mL | INTRAVENOUS | Status: DC | PRN

## 2015-06-18 MED ORDER — PANTOPRAZOLE SODIUM 40 MG PO TBEC: 40.0000 mg | DELAYED_RELEASE_TABLET | Freq: Two times a day (BID) | ORAL | Status: DC

## 2015-06-18 MED ORDER — LIDOCAINE-PRILOCAINE 2.5-2.5 % EX CREA: 1.0000 "application " | TOPICAL_CREAM | CUTANEOUS | Status: DC | PRN

## 2015-06-18 MED ORDER — DOXERCALCIFEROL 4 MCG/2ML IV SOLN
INTRAVENOUS | Status: AC
  Filled 2015-06-18: qty 2

## 2015-06-18 MED ORDER — MIDODRINE HCL 5 MG PO TABS
ORAL_TABLET | ORAL | Status: AC
  Filled 2015-06-18: qty 2

## 2015-06-18 MED ORDER — MIDODRINE HCL 10 MG PO TABS: 10.0000 mg | ORAL_TABLET | ORAL | Status: DC

## 2015-06-18 MED ORDER — LIDOCAINE HCL (PF) 1 % IJ SOLN: 5.0000 mL | INTRAMUSCULAR | Status: DC | PRN

## 2015-06-18 MED ORDER — HEPARIN SODIUM (PORCINE) 1000 UNIT/ML DIALYSIS: 1000.0000 [IU] | INTRAMUSCULAR | Status: DC | PRN

## 2015-06-18 NOTE — [place] FL2 (Signed)
[place] MEDICAID FL2 LEVEL OF CARE SCREENING TOOL     IDENTIFICATION  Patient Name: Samantha [name] Birthdate: 05-27-1945 Sex: female Admission Date (Current Location): 06/14/2015  [place]  [place] [id number] Facility and Address:  [facility], [address], [place], [place] [address]      Provider Number: [id number]  Attending Physician Name and Address:  Rolly SalterPranav M Patel, MD  Relative Name and Phone Number:  [facility] - [phone]    Current Level of Care: Hospital Recommended Level of Care: Skilled Nursing Facility Prior Approval Number:    Date Approved/Denied:   PASRR Number: [id number] (Eff. 06/18/15)  Discharge Plan: SNF    Current Diagnoses: Patient Active Problem List   Diagnosis Date Noted  . Neutropenia (HCC) 06/15/2015  . Acute encephalopathy 06/14/2015  . Sepsis (HCC) 06/14/2015  . 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 Arteriovenous graft infection (HCC)   . Gram-negative bacteremia (HCC)   . Cirrhosis of liver (HCC) 12/26/2014  . Thrombocytopenia (HCC) 12/25/2014  . 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 Cardiomegaly 12/25/2014  . Bacteremia 12/25/2014  . Cellulitis of left upper extremity   . Mechanical complication of other vascular device, implant, and graft 10/08/2013  . End stage renal disease (HCC) 01/16/2013  . Hyponatremia 04/12/2012  . Pancreatitis 04/10/2012  . Healthcare-associated pneumonia 04/09/2012  . Hypotension 04/09/2012  . SOB (shortness of breath) 04/08/2012  . Abdominal pain 04/08/2012  . Symptomatic cholelithiasis 11/16/2011  . Acute blood loss anemia 06/08/2011  . Transaminitis 06/08/2011  . Hepatitis B antibody positive 06/08/2011  . GI bleed 06/06/2011  . ESRD on hemodialysis (HCC) 06/06/2011  . HTN (hypertension) 06/06/2011  . Shoulder pain 06/06/2011  . RUQ abdominal tenderness 06/06/2011  . Anemia in chronic kidney  disease (CKD) 06/06/2011    Orientation RESPIRATION BLADDER Height & Weight     Self, Time, Situation, Place  Normal Continent Weight: 175 lb 7.8 oz (79.6 kg) Height:  4\' 9"  (144.8 cm)  BEHAVIORAL SYMPTOMS/MOOD NEUROLOGICAL BOWEL NUTRITION STATUS      Continent Diet (Renal diet with fluid restriction)  AMBULATORY STATUS COMMUNICATION OF NEEDS Skin   Limited Assist Verbally Normal                       Personal Care Assistance Level of Assistance  Bathing, Feeding, Dressing Bathing Assistance: Limited assistance Feeding assistance: Independent Dressing Assistance: Limited assistance     Functional Limitations Info  Sight, Hearing, Speech Sight Info: Adequate Hearing Info: Impaired (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 Recommended a minimum of 3X per week OT Frequency: Evaluation 06/17/15. Recommended a minimum of 2X per week            Contractures Contractures Info: Not present    Additional Factors Info  Code Status, Allergies Code Status Info: DNR Allergies Info: Banana, chocolate, fish-drived products           Current Medications (06/18/2015):  This is the current hospital active medication list Current Facility-Administered Medications  Medication Dose Route Frequency Provider Last Rate Last Dose  . doxercalciferol (HECTOROL) injection 3 mcg  3 mcg Intravenous Q M,W,F-HD Weston SettleMartha Bergman, PA-C   3 mcg at 06/16/15 1011  . ferric gluconate (NULECIT) 62.5 mg in  sodium chloride 0.9 % 100 mL IVPB  62.5 mg Intravenous Q Fri-HD Weston Settle, PA-C      . lactulose (CHRONULAC) 10 GM/15ML solution 30 g  30 g Oral BID Rolly Salter, MD   30 g at 06/18/15 1010  . levothyroxine (SYNTHROID, LEVOTHROID) tablet 50 mcg  50 mcg Oral QAC breakfast Lorretta Harp, MD   50 mcg at 06/17/15 [phone]  . midodrine (PROAMATINE) 5  MG tablet           . midodrine (PROAMATINE) tablet 10 mg  10 mg Oral Q dialysis Rolly Salter, MD   10 mg at 06/18/15 1222  . multivitamin (RENA-VIT) tablet 1 tablet  1 tablet Oral QHS Weston Settle, PA-C   1 tablet at 06/17/15 2151  . pantoprazole (PROTONIX) EC tablet 40 mg  40 mg Oral BID Lauren D Bajbus, RPH   40 mg at 06/18/15 1010  . sodium chloride flush (NS) 0.9 % injection 3 mL  3 mL Intravenous Q12H Lorretta Harp, MD   3 mL at 06/18/15 1016     Discharge Medications: Please see discharge summary for a list of discharge medications.  Relevant Imaging Results:  Relevant Lab Results:   Additional Information SS#[id number].  Patient receives dialysis treatments MWF at [facility].  Cristobal Goldmann, LCSW

## 2015-06-18 NOTE — Discharge Summary (Addendum)
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 Please follow up with PCP with CBC in 1 week 2. Please follow up with GI in 1-2 week for follow up EGD 3. Please establish care with gynecology in 1 month for follow up on left ovarian cyst and endometrial thickening.  4. discharging to SNF  Follow-up Information    Follow up with Dorrene German, MD. Schedule an appointment as soon as possible for a visit in 1 week.   Specialty:  Internal Medicine   Why:  with CBC   Contact information:   [address] [place] [address] [phone]       Follow up with HUNG,PATRICK D, MD In 2 weeks.   Specialty:  Gastroenterology   Contact information:   [address] [place] [address] [phone]       Follow up with [facility]. Call in 1 week.   Why:  to establish care.   Contact information:   [address] [place] [address] [phone]     Diet recommendation: renal diet  Activity: The patient is advised to gradually reintroduce usual activities.  Discharge Condition: fair  History of present illness: As per the H and P dictated on admission, "Samantha [name] is a 70 y.o. female with PMH of hypertension, hypothyroidism, ESRD-HD, headache, dizziness, occasional tingling sensation in fingers, gastric ulcer, GI bleeding, hepatitis B, diastolic congestive heart failure,  chronic leukopenia, who presents with altered mental status, abdominal pain and rectal bleeding.  Per family, patient has been confused in the past 3 days. Her abdominal pain is located in the left lower quadrant. She has diarrhea and mild rectal bleeding with bright right blood in her and nowhere per her grand daughter. They cannot provide detailed inflammations. Patient also has dry cough which is some mild, no fever, chills, shortness of breath or chest pain. No runny nose or sore throat. Patient has been compliant to her dialysis, last dialysis was today. Per her granddaughter, her AVF is not functioning, but the patient refused to left surgeon to put a new AV fistular for her. She is using port-line for dialysis now. Patient does not have nausea, vomiting, diarrhea, unilateral weakness.  In ED, patient was found to have elevated ammonia and 73, positive FOBT, WBC 2.4 which was 2.5 on 12/28/14, thrombocytopenia with platelet 89, lactate 3.51--> 3.89, temperature normal, no tachycardia, creatinine 5.17, BUN 13, bicarbonate 24, normal potassium. Chest x-ray is negative for infiltration. CT head is negative for acute intracranial abnormalities. CT abdomen/pelvis showed no acute abnormality within the abdomen or pelvis; cirrhosis with evidence of portal hypertension and varices, but no significant free fluid or ascites, persistent left adnexal cystic structure. However, this structure may be enlarging. Consider a non-emergent pelvic ultrasound to ensure that this has benign characteristics in a postmenopausal female."  Hospital Course:  Summary of her  active problems in the hospital is as following. 1. Acute encephalopathy Most likely hepatic encephalopathy Ammonia level elevated on admission. Currently resolved and at her baseline No asterixis on examination. Continue current management. We will reduce the lactulose dose to twice a day.  2. ESRD on hemodialysis. Post-HD hypotension Tolerating  hemodialysis well. Nephrology was consulted Pt was hypotensive most likely secondary to increased fluid removal with hemodialysis.  midodrine with HD.  3. Questionable rectal bleeding with the anemia of chronic kidney disease. H&H remains stable. GI was consulted and recommended outpatient follow-up. Continue PPI twice a day for 14 days and then can switch to once a day. Stop aspirin. No indication for vascular disease to continue antiplatelet therapy.  4. Abnormal ultrasound of the pelvis. Endometrial thickness. Left ovarian cyst. Patient does have increase endometrial thickness which is abnormal for her age. Patient's presenting complaints of rectal bleeding, may be pelvic bleeding. She did not have any bleeding episode here in hospital. Currently her H&H remained stable  I recommend the patient to follow-up with OB/GYN as an outpatient for both left ovarian cyst as well as endometrial thickness.  5. Cirrhosis of the liver with thrombocytopenia. History of hepatitis B. Continue lactulose. Continue PPI. Getting hemodialysis for volume overload. Holding nadolol due to hypotension, but should the pt need a beta-blocker in future, non-selective betablocker like nadolol will be ideal given esophageal varices history.  6. Abdominal pain. Patient present with right flank pain. CT of the abdomen on admission was unremarkable. Patient has left-sided adnexal mass and left-sided cystic structure. No etiology identified, likely constipation, At present we'll continue to closely monitor.  7. Elevated lactic acidosis. Sepsis ruled out. The patient does not have any evidence of infection. Lactic acid elevation is likely in the setting of liver failure.  8. pancytopenia Her CBC remained stable. platelet low due to liver disease Hb low due to renal disease as well as chronic blood loss and nutrition, getting IV iron with HD   All other chronic medical condition were stable during the  hospitalization.  Patient was seen by physical therapy, who recommended SNF, which was arranged by Child psychotherapist and case Production designer, theatre/television/film. On the day of the discharge the patient's hb was stable and vitals were stable as well, and no other acute medical condition were reported by patient. the patient was felt safe to be discharge at [facility].  Procedures and Results:  hemodialysis   Consultations:  Nephrology  Gastroenterology   DISCHARGE MEDICATION: Current Discharge Medication List    START taking these medications   Details  lactulose (CHRONULAC) 10 GM/15ML solution Take 45 mLs (30 g total) by mouth 2 (two) times daily. Qty: 240 mL, Refills: 0    midodrine (PROAMATINE) 10 MG tablet Take 1 tablet (10 mg total) by mouth every Monday, Wednesday, and Friday with hemodialysis. Qty: 30 tablet, Refills: 0      CONTINUE these medications which have CHANGED   Details  pantoprazole (PROTONIX) 40 MG tablet Take 1 tablet (40 mg total) by mouth 2 (two) times daily before a meal. Qty: 30 tablet, Refills: 0      CONTINUE these medications which have NOT CHANGED   Details  acetaminophen (TYLENOL) 500 MG tablet Take 500 mg by mouth every 6 (six) hours as needed for moderate pain.    levothyroxine (SYNTHROID, LEVOTHROID) 50 MCG tablet Take 50 mcg by mouth daily before breakfast.       STOP taking these medications     aspirin 325 MG tablet  metoprolol (LOPRESSOR) 50 MG tablet      HYDROcodone-acetaminophen (NORCO/VICODIN) 5-325 MG tablet        Allergies  Allergen Reactions  . Banana Other (See Comments)    Due to dialysis  . Chocolate Other (See Comments)    Due to dialysis  . Fish-Derived Products Other (See Comments)    Due to gout   Discharge Instructions    Ambulatory referral to Gynecology    Complete by:  As directed   Left ovarian cyst and endometrial thickening.     Diet renal [id number]    Complete by:  As directed      Discharge instructions    Complete by:  As  directed   It is important that you read following instructions as well as go over your medication list with RN to help you understand your care after this hospitalization.  Discharge Instructions: Please follow-up with PCP in one week  Please request your primary care physician to go over all Hospital Tests and Procedure/Radiological results at the follow up,  Please get all Hospital records sent to your PCP by signing hospital release before you go home.   You were cared for by a hospitalist during your hospital stay. If you have any questions about your discharge medications or the care you received while you were in the hospital after you are discharged, you can call the unit and ask to speak with the hospitalist on call if the hospitalist that took care of you is not available.  Once you are discharged, your primary care physician will handle any further medical issues. Please note that NO REFILLS for any discharge medications will be authorized once you are discharged, as it is imperative that you return to your primary care physician (or establish a relationship with a primary care physician if you do not have one) for your aftercare needs so that they can reassess your need for medications and monitor your lab values. You Must read complete instructions/literature along with all the possible adverse reactions/side effects for all the Medicines you take and that have been prescribed to you. Take any new Medicines after you have completely understood and accept all the possible adverse reactions/side effects. Wear Seat belts while driving. If you have smoked or chewed Tobacco in the last 2 yrs please stop smoking and/or stop any Recreational drug use.     Increase activity slowly    Complete by:  As directed           Discharge Exam: Filed Weights   06/16/15 1300 06/17/15 2031 06/18/15 1145  Weight: 74.9 kg (165 lb 2 oz) 80 kg (176 lb 5.9 oz) 79.6 kg (175 lb 7.8 oz)   Filed Vitals:    06/18/15 1300 06/18/15 1330  BP: 119/69 122/44  Pulse: 71 68  Temp:    Resp:     General: Appear in no distress, no Rash; Oral Mucosa moist. Cardiovascular: S1 and S2 Present, no Murmur, no JVD Respiratory: Bilateral Air entry present and Clear to Auscultation, no Crackles, no wheezes Abdomen: Bowel Sound present, Soft and no tenderness Extremities: no Pedal edema, no calf tenderness Neurology: Grossly no focal neuro deficit.  The results of significant diagnostics from this hospitalization (including imaging, microbiology, ancillary and laboratory) are listed below for reference.    Significant Diagnostic Studies: Ct Abdomen Pelvis Wo Contrast  06/14/2015  CLINICAL DATA:  Abdominal pain.  Rectal bleeding. EXAM: CT ABDOMEN AND PELVIS WITHOUT CONTRAST TECHNIQUE: Multidetector CT imaging of the abdomen  and pelvis was performed following the standard protocol without IV contrast. COMPARISON:  12/26/2014 FINDINGS: Lower chest: Lung bases are clear. Dialysis catheter tip in the right atrium. Mitral annular calcifications. Hepatobiliary: Liver is diffusely nodular and compatible with cirrhosis. Gallbladder has been removed. Common bile duct roughly measures 1 cm and similar to the previous examination. Pancreas: No gross abnormality to the pancreas. Spleen: Spleen is prominent and similar to the previous exam. Again noted are enlarged vascular structures around the spleen which drain into the left renal vein. Known varices around the stomach and distal esophagus are poorly characterized on this examination. Adrenals/Urinary Tract: No acute abnormality to the adrenal glands. Bilateral atrophic kidneys compatible with end-stage renal disease. Evidence for at least 2 cysts in the left kidney. No hydronephrosis. Urinary bladder is decompressed. Stomach/Bowel: Difficult to exclude wall thickening in the rectal region. There is no significant pericolonic inflammation. Mild mesenteric edema is nonspecific. No  evidence for a bowel obstruction. Vascular/Lymphatic: Atherosclerotic calcifications in the aorta without aneurysm. No significant lymphadenopathy. Reproductive: Again noted is a low-density structure in the left adnexa. This structure now measures up to 3.5 cm and previously measured 2.5 cm. No gross abnormality to the uterus. Other: There is no significant free fluid or ascites. Musculoskeletal: The bony structures are very heterogeneous with patchy areas of sclerosis. Again noted is severe disc disease at L2-L3 with multiple Schmorl's nodes and vacuum phenomenon. IMPRESSION: No acute abnormality within the abdomen or pelvis. Changes compatible with end-stage renal disease. 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 Persistent left adnexal cystic structure. However, this structure may be enlarging. Consider a non-emergent pelvic ultrasound to ensure that this has benign characteristics in a postmenopausal female. 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 Electronically Signed   By: Richarda Overlie M.D.   On: 06/14/2015 21:16   Ct Head Wo Contrast  06/14/2015  CLINICAL DATA:  Altered mental status.  Unsteady gait.  Confusion. EXAM: CT HEAD WITHOUT CONTRAST TECHNIQUE: Contiguous axial images were obtained from the base of the skull through the vertex without intravenous contrast. COMPARISON:  None. FINDINGS: Sinuses/Soft tissues: renal osteodystrophy. Clear paranasal sinuses and mastoid air cells. Intracranial: Age advanced cerebral atrophy. Moderate low density in the periventricular white matter likely related to small vessel disease. No mass lesion, hemorrhage, hydrocephalus, acute infarct, intra-axial, or extra-axial fluid collection. IMPRESSION: 1.  No acute intracranial abnormality. 2.  Cerebral atrophy and small vessel ischemic change. Electronically Signed   By: Jeronimo Greaves M.D.   On: 06/14/2015 21:05   US Pelvis Complete  06/15/2015  CLINICAL DATA:  Left adnexal mass on recent CT EXAM: TRANSABDOMINAL  ULTRASOUND OF PELVIS TECHNIQUE: Transabdominal ultrasound examination of the pelvis was performed including evaluation of the uterus, ovaries, adnexal regions, and pelvic cul-de-sac. COMPARISON:  CT abdomen and pelvis June 14, 2015 FINDINGS: Uterus Measurements: 7.2 x 3.7 x 4.3 cm. No fibroids or other mass visualized. Endometrium Thickness: 17 mm. Endometrium appears irregular in contour. There is inhomogeneous echotexture within the endometrium with a questionable 6 mm polyp in the mid endometrial region. Right ovary Measurements: 2.5 x 1.7 x 2.0 cm. Normal appearance/no adnexal mass. Left ovary Measurements: 5.1 x 2.9 x 4.1 cm. There is a cystic structure arising from the left ovary with mildly thickened wall measuring 2.1 x 3.3 x 2.0 cm. Other findings:  No abnormal free fluid. IMPRESSION: Thickened endometrium for age with irregular contour and questionable mid endometrial polyp. Endometrial thickness is considered abnormal for an asymptomatic post-menopausal female. Endometrial sampling should  be considered to exclude carcinoma. Cystic structure with mildly thickened wall measuring 2.1 x 3.3 x 2.0 cm arising from left ovary region. Question ovarian cyst or possibly ovarian cystadenoma. As this structure cannot be classified as a simple ovarian cyst, a followup pelvic ultrasound in approximately 6 months is advised to assess for stability. These results will be called to the ordering clinician or representative by the Radiologist Assistant, and communication documented in the PACS or zVision Dashboard. Electronically Signed   By: Bretta BangWilliam  Woodruff III M.D.   On: 06/15/2015 15:29   Dg Chest Port 1 View  06/14/2015  CLINICAL DATA:  70 year old female with altered mental status and intermittent confusion and unsteady gait EXAM: PORTABLE CHEST 1 VIEW COMPARISON:  Chest radiograph dated 01/12/2015 FINDINGS: Single-view of the chest does not demonstrate focal consolidation. There is no pleural effusion or  pneumothorax. There are mild diffuse vascular interstitial prominence, likely mild congestive changes. There is stable mild cardiomegaly. Eight dwell lumen dialysis catheter is noted in stable position. The degenerative changes of the osseous structures with bilateral shoulder arthroplasties. No acute fracture. IMPRESSION: Stable appearing minimal congestive changes. No focal consolidation. Electronically Signed   By: Elgie CollardArash  Radparvar M.D.   On: 06/14/2015 20:06    Microbiology: Recent Results (from the past 240 hour(s))  Blood Culture (routine x 2)     Status: None (Preliminary result)   Collection Time: 06/14/15  8:35 PM  Result Value Ref Range Status   Specimen Description BLOOD RIGHT HAND  Final   Special Requests IN PEDIATRIC BOTTLE 1CC  Final   Culture NO GROWTH 4 DAYS  Final   Report Status PENDING  Incomplete  Blood Culture (routine x 2)     Status: None (Preliminary result)   Collection Time: 06/14/15  8:40 PM  Result Value Ref Range Status   Specimen Description BLOOD RIGHT HAND  Final   Special Requests IN PEDIATRIC BOTTLE 1CC  Final   Culture NO GROWTH 4 DAYS  Final   Report Status PENDING  Incomplete  MRSA PCR Screening     Status: None   Collection Time: 06/14/15 11:29 PM  Result Value Ref Range Status   MRSA by PCR NEGATIVE NEGATIVE Final    Comment:        The GeneXpert MRSA Assay (FDA approved for NASAL specimens only), is one component of a comprehensive MRSA colonization surveillance program. It is not intended to diagnose MRSA infection nor to guide or monitor treatment for MRSA infections.      Labs: CBC:  Recent Labs Lab 06/14/15 1833  06/15/15 1850 06/15/15 2206 06/16/15 0402 06/16/15 1547 06/17/15 0715  WBC 2.4*  < > 2.5* 2.5* 2.1* 2.5* 2.8*  NEUTROABS 1.4*  --   --   --   --   --  1.1*  HGB 12.4  < > 10.7* 12.0 11.0* 12.9 11.5*  HCT 36.2  < > 32.1* 36.6 33.6* 38.4 34.3*  MCV 92.6  < > 93.9 95.6 95.5 94.3 95.0  PLT 89*  < > 70* 73* 68* 71*  65*  < > = values in this interval not displayed. Basic Metabolic Panel:  Recent Labs Lab 06/14/15 1833 06/15/15 0400 06/16/15 0402 06/17/15 0715 06/18/15 1207  NA 134* 138 140 134* 129*  K 4.0 3.9 3.2* 3.8 3.0*  CL 96* 102 106 99* 96*  CO2 23 20* 20* 20* 19*  GLUCOSE 103* 75 88 80 67  BUN 13 19 28* 14 25*  CREATININE 5.17* 6.28* 8.60*  6.05* 8.70*  CALCIUM 10.7* 9.7 8.9 8.9 8.6*  PHOS  --   --   --   --  4.9*   Liver Function Tests:  Recent Labs Lab 06/14/15 1833 06/15/15 0400 06/18/15 1207  AST 69* 78*  --   ALT 35 39  --   ALKPHOS 112 95  --   BILITOT 1.0 1.2  --   PROT 9.1* 7.7  --   ALBUMIN 3.5 3.1* 2.8*   No results for input(s): LIPASE, AMYLASE in the last 168 hours.  Recent Labs Lab 06/14/15 2051  AMMONIA 73*   Cardiac Enzymes: No results for input(s): CKTOTAL, CKMB, CKMBINDEX, TROPONINI in the last 168 hours. BNP (last 3 results) No results for input(s): BNP in the last 8760 hours. CBG:  Recent Labs Lab 06/15/15 0741 06/17/15 0739 06/18/15 0831  GLUCAP 76 76 85   Time spent: 30 minutes  Signed:  Lynden Oxford  [facility] 06/18/2015, 1:59 PM

## 2015-06-18 NOTE — Clinical Social Work Placement (Signed)
   CLINICAL SOCIAL WORK PLACEMENT  NOTE  Date:  06/18/2015  Patient Details  Name: Samantha [name] MRN: [id number] Date of Birth: 11-14-1945  Clinical Social Work is seeking post-discharge placement for this patient at the Skilled  Nursing Facility level of care (*CSW will initial, date and re-position this form in  chart as items are completed):  Yes   Patient/family provided with [facility] Clinical Social Work Department's list of facilities offering this level of care within the geographic area requested by the patient (or if unable, by the patient's family).  Yes   Patient/family informed of their freedom to choose among providers that offer the needed level of care, that participate in Medicare, Medicaid or managed care program needed by the patient, have an available bed and are willing to accept the patient.  Yes   Patient/family informed of [facility]'s ownership interest in [facility] and [facility], as well as of the fact that they are under no obligation to receive care at these facilities.  PASRR submitted to EDS on 06/18/15     PASRR number received on 06/18/15     Existing PASRR number confirmed on       FL2 transmitted to all facilities in geographic area requested by pt/family on 06/18/15     FL2 transmitted to all facilities within larger geographic area on       Patient informed that his/her managed care company has contracts with or will negotiate with certain facilities, including the following:        Yes   Patient/family informed of bed offers received.  [facility]     Physician recommends and patient chooses bed at      Patient to be transferred to   on  .  Patient to be transferred to facility by       Patient family notified on   of transfer.  Name of family member notified:        PHYSICIAN       Additional Comment:    _______________________________________________ Cristobal Goldmannrawford, Daffney Greenly Bradley,  LCSW 06/18/2015, 7:02 PM

## 2015-06-18 NOTE — Progress Notes (Signed)
WHEN PT IS D/C'D TOMORROW to a facility(still pending), daughter Allen DerryJanice Brown would like the facility to have both of her phone numbers so that they can contact her with any updates that maybe happening.   Cell: [phone] Home [phone]

## 2015-06-18 NOTE — Progress Notes (Signed)
Physical Therapy Cancellation Note   Patient currently off unit for hemodialysis. Will continue to follow and progress.   06/18/15 1300  PT Visit Information  Last PT Received On 06/18/15  Reason Eval/Treat Not Completed Patient at procedure or test/unavailable    Charlsie MerlesLogan Secor Alayja Armas, PT [phone]

## 2015-06-18 NOTE — Progress Notes (Signed)
BP 85/45 after last bolus.  Notified Claiborne Billingsallahan, NP.  Will continue to monitor.

## 2015-06-18 NOTE — Procedures (Signed)
I was present at this dialysis session. I have reviewed the session itself and made appropriate changes.   Sabra Heckyan Oney Folz  MD 06/18/2015, 1:54 PM

## 2015-06-18 NOTE — Clinical Social Work Note (Signed)
Clinical Social Work Assessment  Patient Details  Name: Samantha [name] MRN: [id number] Date of Birth: [birth year]  Date of referral:  06/18/15               Reason for consult:  Facility Placement                Permission sought to share information with:  Facility Medical sales representativeContact Representative (Patient extremely HOH, talked to patient's granddaughter (POA) ) Permission granted to share information::  Yes, Verbal Permission Granted  Name::     [employer] Partners[employer]amonica Washington  Agency::     Relationship::  Granddaughter  Contact Information:  [phone]  Housing/Transportation Living arrangements for the past 2 months:  Single Family Home (Patient's grandson Katy FitchDavid Meadows lives with patient) Source of Information:  Power of Pensions consultantAttorney (Granddaughter SumterShamonica Washington) Patient Interpreter Needed:  None Criminal Activity/Legal Involvement Pertinent to Current Situation/Hospitalization:  No - Comment as needed Significant Relationships:  Other Family Members Lives with:  Relatives Do you feel safe going back to the place where you live?  No (Granddaughter understands that patient needs Rehab before returning home) Need for family participation in patient care:  Yes (Comment)  Care giving concerns:  Patient granddaughter expressed that their would be no one available to care for patient at home post-discharge and is in agreement with rehab for patient.   Social Worker assessment / plan:  CSW talked by phone with patient's granddaughter, Ms. Washington regarding recommendation of ST rehab. Ms. Barnabas ListerWashington explained that patient lives with her brother Katy FitchDavid Meadows. She added that she and her brother works and feels that patient will benefit from ST rehab before returning home to increase her safety once home. When asked, Ms. Washington responded that her grandmother had never been to a facility before for rehab. Facility search process explained and SNF list emailed to granddaughter.  CSW talked very briefly  with patient regarding talking to her granddaughter and going to a facility for rehab. Patient very pleasant but very hard of hearing and not sure if patient understood what was being said.   Employment status:  Retired Health and safety inspectornsurance information:  Armed forces operational officerMedicare, Medicaid In [employer] PT Recommendations:  Skilled Nursing Facility Information / Referral to community resources:  Skilled Holiday representativeursing Facility ([facility]  Patient/Family's Response to care:  Granddaughter did not express any concerns regarding patient's care during hospitalization.  Patient/Family's Understanding of and Emotional Response to Diagnosis, Current Treatment, and Prognosis:  Not discussed.  Emotional Assessment Appearance:  Appears stated age Attitude/Demeanor/Rapport:  Other (Pleasant) Affect (typically observed):  Pleasant Orientation:  Oriented to Self, Oriented to Place, Oriented to  Time, Oriented to Situation Alcohol / Substance use:  Tobacco Use (Patient reports use of chewing tobacco) Psych involvement (Current and /or in the community):  No (Comment)  Discharge Needs  Concerns to be addressed:  Discharge Planning Concerns Readmission within the last 30 days:  No Current discharge risk:  None Barriers to Discharge:  No Barriers Identified   Cristobal GoldmannCrawford, Alfredia Desanctis Bradley, LCSW 06/18/2015, 6:55 PM

## 2015-06-19 LAB — CULTURE, BLOOD (ROUTINE X 2)
CULTURE: NO GROWTH
Culture: NO GROWTH

## 2015-06-19 LAB — GLUCOSE, CAPILLARY: Glucose-Capillary: 75 mg/dL (ref 65–99)

## 2015-06-19 NOTE — Progress Notes (Signed)
Clinical Social Work  Per handoff, pt and guardian prefer [facility] at [facility]. CSW contacted facility who will review information and will call CSW with determination if they can accept pt today.   Samantha [name], [place] Weekend Coverage [phone]

## 2015-06-19 NOTE — Clinical Social Work Placement (Signed)
   CLINICAL SOCIAL WORK PLACEMENT  NOTE  Date:  06/19/2015  Patient Details  Name: Samantha [name] M Masden MRN: [id number] Date of Birth: 10-22-1945  Clinical Social Work is seeking post-discharge placement for this patient at the Skilled  Nursing Facility level of care (*CSW will initial, date and re-position this form in  chart as items are completed):  Yes   Patient/family provided with [facility] Clinical Social Work Department's list of facilities offering this level of care within the geographic area requested by the patient (or if unable, by the patient's family).  Yes   Patient/family informed of their freedom to choose among providers that offer the needed level of care, that participate in Medicare, Medicaid or managed care program needed by the patient, have an available bed and are willing to accept the patient.  Yes   Patient/family informed of [facility]'s ownership interest in [facility] and [facility], as well as of the fact that they are under no obligation to receive care at these facilities.  PASRR submitted to EDS on 06/18/15     PASRR number received on 06/18/15     Existing PASRR number confirmed on       FL2 transmitted to all facilities in geographic area requested by pt/family on 06/18/15     FL2 transmitted to all facilities within larger geographic area on       Patient informed that his/her managed care company has contracts with or will negotiate with certain facilities, including the following:        Yes   Patient/family informed of bed offers received.  [facility]     Physician recommends and patient chooses bed at      [facility].  Patient to be transferred to facility by [facility]     Patient family notified on 06/19/15 of transfer.  Name of family member notified:  Onalee HuaDavid (grandson) in room and Mrs. Washington (granddtr) via phone     PHYSICIAN       Additional Comment:     _______________________________________________ Marnee SpringGerber, Louana Fontenot Marie, LCSW 06/19/2015, 11:30 AM

## 2015-06-19 NOTE — Progress Notes (Signed)
Pt prepared for d/c to SNF. IV d/c'd. Skin intact except as charted in most recent assessments. Vitals are stable. Called receiving facility [facility] had to leave a message for the supervisor. Pt to be transported by ambulance service.  Peri MarisAndrew Selinda Korzeniewski, MBA, BS, RN

## 2015-06-19 NOTE — Progress Notes (Signed)
[facility] Progress Note   Dialysis Orders: [facility] 4 hr 160 400//A1.5 2 K 3.5 Ca profile 4 low temp heparin 2000 EDW 76.5 venofer 50 last 2/24 Mircera 75 q 4 weeks last 2/15  Recent labs; Hgb 11.4 37%sat ferritin 506 Ca/P ok iPTH 272  Assessment/Plan: 1. AMS/cirrhosis - 2/2 HE, resolved.  At baseline.  2. Rectal bleeding - stable Hb, resolved.  3. ESRD - MWF - On schedule.  Can return to outpt unit on Monday 4. Hypotension/volume -  Off BP meds.  Looks like EDW 80kg is reasonable. Toelrated HD yesterday.  Might need to add midodrine 5. Anemia - Hgb 11.5 - watch - ESA due for redose mid March- 6. Metabolic bone disease - Prone to hypocalcemia - requires added Ca bath; hectorol; no binders or sensipar;  7. Nutrition -renal diet 8. Pancytopenia - chronic -follow- stable, no heparin but can use prn if needed for catheter flow 9. Hypothyroidism - TSH 5 on synthroid 10. DNR 11. 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  No needs. No new events.   Objective Filed Vitals:   06/18/15 1547 06/18/15 1642 06/18/15 2049 06/19/15 0536  BP: 125/75 105/54 97/43 104/57  Pulse: 85 87 84 75  Temp:  98 F (36.7 C) 98.6 F (37 C) 98.6 F (37 C)  TempSrc:  Oral Oral   Resp:  16 16 18   Height:      Weight:      SpO2:  100% 100% 100%   Physical Exam General: NAD eating breakfast Heart: RRR Lungs: no rales Abdomen: obese soft Extremities: obese no overt edema Dialysis Access: left IJ  Additional Objective Labs: . Basic Metabolic Panel:  Recent Labs Lab 06/16/15 0402 06/17/15 0715 06/18/15 1207  NA 140 134* 129*  K 3.2* 3.8 3.0*  CL 106 99* 96*  CO2 20* 20* 19*  GLUCOSE 88 80 67  BUN 28* 14 25*  CREATININE 8.60* 6.05* 8.70*  CALCIUM 8.9 8.9 8.6*  PHOS  --   --  4.9*   Liver Function Tests:  Recent Labs Lab 06/14/15 1833 06/15/15 0400 06/18/15 1207  AST 69* 78*  --    ALT 35 39  --   ALKPHOS 112 95  --   BILITOT 1.0 1.2  --   PROT 9.1* 7.7  --   ALBUMIN 3.5 3.1* 2.8*  CBC:  Recent Labs Lab 06/14/15 1833  06/15/15 1850 06/15/15 2206 06/16/15 0402 06/16/15 1547 06/17/15 0715  WBC 2.4*  < > 2.5* 2.5* 2.1* 2.5* 2.8*  NEUTROABS 1.4*  --   --   --   --   --  1.1*  HGB 12.4  < > 10.7* 12.0 11.0* 12.9 11.5*  HCT 36.2  < > 32.1* 36.6 33.6* 38.4 34.3*  MCV 92.6  < > 93.9 95.6 95.5 94.3 95.0  PLT 89*  < > 70* 73* 68* 71* 65*  < > = values in this interval not displayed. Blood Culture    Component Value Date/Time   SDES BLOOD RIGHT HAND 06/14/2015 2040   SPECREQUEST IN PEDIATRIC BOTTLE 1CC 06/14/2015 2040   CULT NO GROWTH 4 DAYS 06/14/2015 2040   REPTSTATUS PENDING 06/14/2015 2040   CBG:  Recent Labs Lab 06/15/15 0741 06/17/15 0739 06/18/15 0831 06/19/15 0730  GLUCAP 76 76 85 75    Studies/Results: No results found. Medications:   . doxercalciferol  3 mcg  Intravenous Q M,W,F-HD  . ferric gluconate (FERRLECIT/NULECIT) IV  62.5 mg Intravenous Q Fri-HD  . lactulose  30 g Oral BID  . levothyroxine  50 mcg Oral QAC breakfast  . multivitamin  1 tablet Oral QHS  . pantoprazole  40 mg Oral BID  . sodium chloride flush  3 mL Intravenous Q12H

## 2015-06-19 NOTE — Progress Notes (Signed)
Clinical Social Work  Pt has been accepted [facility]. CSW went to room and informed pt and grandson Onalee Hua(David) at bedside who are all aware and agreeable. CSW left a message with granddtr (Mrs. [place]) re: DC plans. Family prefers [facility] and aware of no guarantee of payment. CSW left DNR and [facility] forms on chart. RN to call report to [phone]. [facility] was arranged for transportation.  CSW is signing off but available if needed.  Unk LightningHolly Kinberly Perris, [place]  Weekend Coverage  [phone]

## 2015-06-19 NOTE — Progress Notes (Signed)
[facility] PROGRESS NOTE  Patient: Samantha [name] ZOX:[id number]   PCP: Dorrene GermanEdwin A Avbuere, MD DOB: 1945/10/08   DOA: 06/14/2015   DOS: 06/19/2015    Subjective: pt does not have any new acute complain, has been eating well  Objective: clear to auscultation Blood pressure stable   Assessment and plan: Continue current discharge plan.  Discuss with the family regarding the post-discharge plan and all question were answered satisfactorily.  Author: Lynden OxfordPranav Micajah Dennin, MD [facility] Pager: [phone] 06/19/2015 11:45 AM   If 7PM-7AM, please contact night-coverage at [email or web address], password [email or web address]

## 2015-06-22 ENCOUNTER — Non-Acute Institutional Stay (SKILLED_NURSING_FACILITY): Payer: Medicare Other | Admitting: Internal Medicine

## 2015-06-22 ENCOUNTER — Encounter: Payer: Self-pay | Admitting: Internal Medicine

## 2015-06-22 DIAGNOSIS — G4700 Insomnia, unspecified: Secondary | ICD-10-CM

## 2015-06-22 DIAGNOSIS — R531 Weakness: Secondary | ICD-10-CM

## 2015-06-22 DIAGNOSIS — E876 Hypokalemia: Secondary | ICD-10-CM | POA: Diagnosis not present

## 2015-06-22 DIAGNOSIS — K922 Gastrointestinal hemorrhage, unspecified: Secondary | ICD-10-CM

## 2015-06-22 DIAGNOSIS — Z992 Dependence on renal dialysis: Secondary | ICD-10-CM

## 2015-06-22 DIAGNOSIS — K7290 Hepatic failure, unspecified without coma: Secondary | ICD-10-CM | POA: Diagnosis not present

## 2015-06-22 DIAGNOSIS — D638 Anemia in other chronic diseases classified elsewhere: Secondary | ICD-10-CM

## 2015-06-22 DIAGNOSIS — N186 End stage renal disease: Secondary | ICD-10-CM

## 2015-06-22 DIAGNOSIS — K7682 Hepatic encephalopathy: Secondary | ICD-10-CM

## 2015-06-22 DIAGNOSIS — E871 Hypo-osmolality and hyponatremia: Secondary | ICD-10-CM

## 2015-06-22 DIAGNOSIS — N939 Abnormal uterine and vaginal bleeding, unspecified: Secondary | ICD-10-CM

## 2015-06-22 DIAGNOSIS — I953 Hypotension of hemodialysis: Secondary | ICD-10-CM

## 2015-06-22 NOTE — Progress Notes (Signed)
[facility]  PCP: Dorrene GermanEdwin A Avbuere, MD   Code Status: Full Code  Goals of care: Advanced Directive information Advanced Directives 06/14/2015  Does patient have an advance directive? Yes  Type of Advance Directive -  Does patient want to make changes to advanced directive? -  Copy of advanced directive(s) in chart? -  Would patient like information on creating an advanced directive? -       Extended Emergency Contact Information Primary Emergency Contact: [facility],David Address: [address]          [place], [place] [address] [place] Home Phone: [phone] Work Phone: [phone] Mobile Phone: [phone] Relation: Grandson Secondary Emergency Contact: Lorane GellHenderson,Larry  [place] Home Phone: [phone] Relation: Relative   Allergies  Allergen Reactions  . Banana Other (See Comments)    Due to dialysis  . Chocolate Other (See Comments)    Due to dialysis  . Fish-Derived Products Other (See Comments)    Due to gout    Chief Complaint  Patient presents with  . New Admit To SNF    New Admission     HPI:  Patient is a 70 y.o. female seen today for short term rehabilitation post hospital admission from 06/14/15-06/18/15 with acute encephalopathy thought to be from hepatic encephalopathy with elevated ammonia level. She was given lactulose. There was concern for rectal bleed and she was seen by GI. She was placed on PPI bid x 2 week, then daily. 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  Review of Systems:  Constitutional: Negative for fever, malaise and diaphoresis. Positive for chills.  HENT: Negative for congestion, nasal discharge, sore throat, difficulty swallowing. Positive for headache and deaf in her right ear Eyes: Negative for blurred vision, double vision and discharge.  Respiratory: Negative for wheezing. Positive for dry cough and shortness of breath with exertion    Cardiovascular: Negative for chest pain, palpitations, leg swelling.  Gastrointestinal: Negative for nausea, vomiting,loss of appetite, melena, diarrhea and constipation. Had bowel movement yesterday. Positive for occasional heartburn and abdominal pain Genitourinary: Negative for dysuria and flank pain. Patient is anuric Musculoskeletal: Negative for back pain, fall.  Skin: Negative for itching, rash.  Neurological: Negative for dizziness. strength coming back Psychiatric/Behavioral: Negative for depression, anxiety. Positive for insomnia   Past Medical History  Diagnosis Date  . Shoulder pain   . Headache(784.0)     occasionally  . Dizziness   . Occasional numbness/prickling/tingling of fingers and toes   . Arthritis     left shoulder  . Joint pain   . Joint swelling   . Gastric ulcer   . Dry skin   . Peripheral edema   . Constipation   . Oligouria   . H/O: GI bleed   . Hepatitis     Hep B  . History of kidney stones   . History of blood transfusion   . Hypertension   . Anemia   . Hypothyroidism     takes Synthroid daily  . Impaired hearing   . Diabetes mellitus     borderline  . Renal disorder     m, w, F    Past Surgical History  Procedure Laterality Date  . Shoulder surgery  3-5486yrs ago    right replacement  . Esophagogastroduodenoscopy  06/09/2011    Procedure: ESOPHAGOGASTRODUODENOSCOPY (EGD);  Surgeon: Theda BelfastPatrick D Hung, MD;  [facility];  Service: Endoscopy;  Laterality: N/A;  .  Cataract surgery      bilateral  . Reverse shoulder arthroplasty  09/22/2011    Procedure: REVERSE SHOULDER ARTHROPLASTY;  Surgeon: Verlee Rossetti, MD;  Location[facility] OR;  Service: Orthopedics;  Laterality: Left;  left reverse shoulder arthroplasty  . Cholecystectomy  12/26/2011  . Cholecystectomy  12/26/2011    Procedure: LAPAROSCOPIC CHOLECYSTECTOMY;  Surgeon: Atilano Ina, MD,FACS;  Location: [facility] OR;  Service: General;  Laterality: N/A;  . Arteriovenous graft placement Left      forearm  . Shuntogram Left 08/29/2012    Procedure: SHUNTOGRAM;  Surgeon: Fransisco Hertz, MD;  Location: [facility] CATH LAB;  Service: Cardiovascular;  Laterality: Left;  arm  . Revision of arteriovenous goretex graft Left 01/12/2015    Procedure: REVISION OF Left FOREARM ARTERIOVENOUS GORETEX GRAFT;  Surgeon: Fransisco Hertz, MD;  Location: [facility] OR;  Service: Vascular;  Laterality: Left;  . Insertion of dialysis catheter Right 01/12/2015    Procedure: INSERTION OF DIALYSIS CATHETER;  Surgeon: Fransisco Hertz, MD;  Location: [facility] OR;  Service: Vascular;  Laterality: Right;  . I&d extremity Left 03/23/2015    Procedure: DRAINAGE OF LEFT ARM SEROMA;  Surgeon: Fransisco Hertz, MD;  Location: [facility] OR;  Service: Vascular;  Laterality: Left;  . Ligation arteriovenous gortex graft Left 03/23/2015    Procedure: LIGATION AND EXCISION OF LEFT ARTERIOVENOUS GORTEX GRAFT;  Surgeon: Fransisco Hertz, MD;  Location: [facility] OR;  Service: Vascular;  Laterality: Left;   Social History:   reports that she has never smoked. Her smokeless tobacco use includes Chew. She reports that she does not drink alcohol or use illicit drugs.  Family History  Problem Relation Age of Onset  . Hypertension Mother   . Diabetes Mother   . Cancer Brother     Medications:   Medication List       This list is accurate as of: 06/22/15  3:41 PM.  Always use your most recent med list.               acetaminophen 500 MG tablet  Commonly known as:  TYLENOL  Take 500 mg by mouth every 6 (six) hours as needed for moderate pain.     lactulose 10 GM/15ML solution  Commonly known as:  CHRONULAC  Take 45 mLs (30 g total) by mouth 2 (two) times daily.     levothyroxine 50 MCG tablet  Commonly known as:  SYNTHROID, LEVOTHROID  Take 50 mcg by mouth daily before breakfast.     midodrine 10 MG tablet  Commonly known as:  PROAMATINE  Take 1 tablet (10 mg total) by mouth every Monday, Wednesday, and Friday with hemodialysis.     pantoprazole 40 MG tablet   Commonly known as:  PROTONIX  Take 1 tablet (40 mg total) by mouth 2 (two) times daily before a meal.        Immunizations: Immunization History  Administered Date(s) Administered  . PPD Test 05/22/2015     Physical Exam: Filed Vitals:   06/22/15 1534  BP: 127/73  Pulse: 95  Temp: 96.9 F (36.1 C)  TempSrc: Oral  Resp: 20  Height:  (1.448 m)  Weight: 175 lb 6.4 oz (79.561 kg)  SpO2: 95%   Body mass index is 37.95 kg/(m^2).  General- elderly female, obese, in no acute distress Head- normocephalic, atraumatic Nose- no maxillary or frontal sinus tenderness, no nasal discharge Throat- moist mucus membrane Eyes- PERRLA, EOMI, no pallor, no icterus, no discharge, normal conjunctiva, normal  sclera Neck- no cervical lymphadenopathy Cardiovascular- normal s1,s2, no murmur, no leg edema Respiratory- bilateral clear to auscultation, no wheeze, no rhonchi, no crackles, no use of accessory muscles Abdomen- bowel sounds present, soft, non tender Musculoskeletal- able to move all 4 extremities, generalized weakness, unsteady gait and using a walker Neurological- alert and oriented to person only Rectal- no blood noted on per rectal exam, no hemorrhoid or fissure Genitala- vaginal bleed noted Skin- warm and dry, right chest dialysis catheter  Psychiatry- normal mood and affect    Labs reviewed: Basic Metabolic Panel:  Recent Labs  75/91/63 0513  06/16/15 0402 06/17/15 0715 06/18/15 06/18/15 1207  NA 135  < > 140 134* 129* 129*  K 4.6  < > 3.2* 3.8  --  3.0*  CL 100*  < > 106 99*  --  96*  CO2 18*  < > 20* 20*  --  19*  GLUCOSE 71  < > 88 80  --  67  BUN 51*  < > 28* 14 25* 25*  CREATININE 9.52*  < > 8.60* 6.05* 8.7* 8.70*  CALCIUM 6.6*  < > 8.9 8.9  --  8.6*  MG 2.0  --   --   --   --   --   PHOS 6.6*  --   --   --   --  4.9*  < > = values in this interval not displayed. Liver Function Tests:  Recent Labs  12/27/14 0530 06/14/15 1833 06/15/15 0400  06/18/15 1207  AST 64* 69* 78*  --   ALT 6* 35 39  --   ALKPHOS 95 112 95  --   BILITOT 1.3* 1.0 1.2  --   PROT 5.7* 9.1* 7.7  --   ALBUMIN 2.1* 3.5 3.1* 2.8*   No results for input(s): LIPASE, AMYLASE in the last 8760 hours.  Recent Labs  12/25/14 2318 12/26/14 0121 06/14/15 2051  AMMONIA 47* 99* 73*   CBC:  Recent Labs  12/25/14 2005  06/14/15 1833  06/16/15 0402 06/16/15 1547 06/17/15 06/17/15 0715  WBC 2.5*  < > 2.4*  < > 2.1* 2.5* 2.8 2.8*  NEUTROABS 1.0*  --  1.4*  --   --   --   --  1.1*  HGB 10.8*  < > 12.4  < > 11.0* 12.9  --  11.5*  HCT 30.6*  < > 36.2  < > 33.6* 38.4  --  34.3*  MCV 97.1  < > 92.6  < > 95.5 94.3  --  95.0  PLT 71*  < > 89*  < > 68* 71*  --  65*  < > = values in this interval not displayed. Cardiac Enzymes: No results for input(s): CKTOTAL, CKMB, CKMBINDEX, TROPONINI in the last 8760 hours. BNP: Invalid input(s): POCBNP CBG:  Recent Labs  06/17/15 0739 06/18/15 0831 06/19/15 0730  GLUCAP 76 85 75    Radiological Exams: Ct Abdomen Pelvis Wo Contrast  06/14/2015  CLINICAL DATA:  Abdominal pain.  Rectal bleeding. EXAM: CT ABDOMEN AND PELVIS WITHOUT CONTRAST TECHNIQUE: Multidetector CT imaging of the abdomen and pelvis was performed following the standard protocol without IV contrast. COMPARISON:  12/26/2014 FINDINGS: Lower chest: Lung bases are clear. Dialysis catheter tip in the right atrium. Mitral annular calcifications. Hepatobiliary: Liver is diffusely nodular and compatible with cirrhosis. Gallbladder has been removed. Common bile duct roughly measures 1 cm and similar to the previous examination. Pancreas: No gross abnormality to the pancreas. Spleen: Spleen is prominent  and similar to the previous exam. Again noted are enlarged vascular structures around the spleen which drain into the left renal vein. Known varices around the stomach and distal esophagus are poorly characterized on this examination. Adrenals/Urinary Tract: No acute  abnormality to the adrenal glands. Bilateral atrophic kidneys compatible with end-stage renal disease. Evidence for at least 2 cysts in the left kidney. No hydronephrosis. Urinary bladder is decompressed. Stomach/Bowel: Difficult to exclude wall thickening in the rectal region. There is no significant pericolonic inflammation. Mild mesenteric edema is nonspecific. No evidence for a bowel obstruction. Vascular/Lymphatic: Atherosclerotic calcifications in the aorta without aneurysm. No significant lymphadenopathy. Reproductive: Again noted is a low-density structure in the left adnexa. This structure now measures up to 3.5 cm and previously measured 2.5 cm. No gross abnormality to the uterus. Other: There is no significant free fluid or ascites. Musculoskeletal: The bony structures are very heterogeneous with patchy areas of sclerosis. Again noted is severe disc disease at L2-L3 with multiple Schmorl's nodes and vacuum phenomenon. IMPRESSION: No acute abnormality within the abdomen or pelvis. Changes compatible with end-stage renal disease. 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 Persistent left adnexal cystic structure. However, this structure may be enlarging. Consider a non-emergent pelvic ultrasound to ensure that this has benign characteristics in a postmenopausal female. 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 Electronically Signed   By: Richarda Overlie M.D.   On: 06/14/2015 21:16   Ct Head Wo Contrast  06/14/2015  CLINICAL DATA:  Altered mental status.  Unsteady gait.  Confusion. EXAM: CT HEAD WITHOUT CONTRAST TECHNIQUE: Contiguous axial images were obtained from the base of the skull through the vertex without intravenous contrast. COMPARISON:  None. FINDINGS: Sinuses/Soft tissues: renal osteodystrophy. Clear paranasal sinuses and mastoid air cells. Intracranial: Age advanced cerebral atrophy. Moderate low density in the periventricular white matter likely related to small vessel disease. No mass  lesion, hemorrhage, hydrocephalus, acute infarct, intra-axial, or extra-axial fluid collection. IMPRESSION: 1.  No acute intracranial abnormality. 2.  Cerebral atrophy and small vessel ischemic change. Electronically Signed   By: Jeronimo Greaves M.D.   On: 06/14/2015 21:05   US Pelvis Complete  06/15/2015  CLINICAL DATA:  Left adnexal mass on recent CT EXAM: TRANSABDOMINAL ULTRASOUND OF PELVIS TECHNIQUE: Transabdominal ultrasound examination of the pelvis was performed including evaluation of the uterus, ovaries, adnexal regions, and pelvic cul-de-sac. COMPARISON:  CT abdomen and pelvis June 14, 2015 FINDINGS: Uterus Measurements: 7.2 x 3.7 x 4.3 cm. No fibroids or other mass visualized. Endometrium Thickness: 17 mm. Endometrium appears irregular in contour. There is inhomogeneous echotexture within the endometrium with a questionable 6 mm polyp in the mid endometrial region. Right ovary Measurements: 2.5 x 1.7 x 2.0 cm. Normal appearance/no adnexal mass. Left ovary Measurements: 5.1 x 2.9 x 4.1 cm. There is a cystic structure arising from the left ovary with mildly thickened wall measuring 2.1 x 3.3 x 2.0 cm. Other findings:  No abnormal free fluid. IMPRESSION: Thickened endometrium for age with irregular contour and questionable mid endometrial polyp. Endometrial thickness is considered abnormal for an asymptomatic post-menopausal female. Endometrial sampling should be considered to exclude carcinoma. Cystic structure with mildly thickened wall measuring 2.1 x 3.3 x 2.0 cm arising from left ovary region. Question ovarian cyst or possibly ovarian cystadenoma. As this structure cannot be classified as a simple ovarian cyst, a followup pelvic ultrasound in approximately 6 months is advised to assess for stability. These results will be called to the ordering clinician or representative by the  Printmaker, and communication documented in the PACS or zVision Dashboard. Electronically Signed   By: Bretta Bang III M.D.   On: 06/15/2015 15:29   Dg Chest Port 1 View  06/14/2015  CLINICAL DATA:  70 year old female with altered mental status and intermittent confusion and unsteady gait EXAM: PORTABLE CHEST 1 VIEW COMPARISON:  Chest radiograph dated 01/12/2015 FINDINGS: Single-view of the chest does not demonstrate focal consolidation. There is no pleural effusion or pneumothorax. There are mild diffuse vascular interstitial prominence, likely mild congestive changes. There is stable mild cardiomegaly. Eight dwell lumen dialysis catheter is noted in stable position. The degenerative changes of the osseous structures with bilateral shoulder arthroplasties. No acute fracture. IMPRESSION: Stable appearing minimal congestive changes. No focal consolidation. Electronically Signed   By: Elgie Collard M.D.   On: 06/14/2015 20:06    Assessment/Plan  Physical deconditioning Will have her work with physical therapy and occupational therapy team to help with gait training and muscle strengthening exercises.fall precautions. Skin care. Encourage to be out of bed.   Hepatic encephalopathy Continue lactulose 30 g bid and monitor bowel movement. No asterexis on exam. Monitor bmp  Vaginal bleed Has hx of endometrial thickening. Make appointment with Gyn to evaluate further. Check cbc  ESRD On HD 3 days a week, monitor  Hyponatremia Monitor bmp with her confusion  Hypokalemia Monitor bmp  Anemia of chronic disease Monitor cbc  Hypothyroidism Continue synthroid 50 mcg daily  Dialysis related hypotension Continue midodrine on HD days and monitor  Gi bleed None further. continue protonix 40 mg bid x 2 weeks, then 40 mg daily and monitor  Insomnia Start melatonin  qhs and monitor   Goals of care: short term rehabilitation   Labs/tests ordered: cbc, cmp 06/23/15  Family/ staff Communication: reviewed care plan with patient and nursing supervisor    Oneal Grout, MD Internal  Medicine [facility] [address] [place], [place] [address] Cell Phone (Monday-Friday 8 am - 5 pm): [phone] On Call: [phone] and follow prompts after 5 pm and on weekends Office Phone: [phone] Office Fax: [phone]

## 2015-06-24 ENCOUNTER — Ambulatory Visit: Payer: Medicare Other | Admitting: Obstetrics and Gynecology

## 2015-06-24 DIAGNOSIS — N950 Postmenopausal bleeding: Secondary | ICD-10-CM

## 2015-06-24 DIAGNOSIS — R9389 Abnormal findings on diagnostic imaging of other specified body structures: Secondary | ICD-10-CM

## 2015-06-25 ENCOUNTER — Encounter: Payer: Self-pay | Admitting: *Deleted

## 2015-06-25 NOTE — Progress Notes (Signed)
Patient ID: Samantha [name], female   DOB: Jan 22, 1946, 70 y.o.   MRN: [id number] Patient was not seen by a provider. She will return in the presence of a care giver for evaluation as the patient is uncertain of the reason for her visit

## 2015-06-29 ENCOUNTER — Non-Acute Institutional Stay (SKILLED_NURSING_FACILITY): Payer: Medicare Other | Admitting: Family

## 2015-06-29 ENCOUNTER — Encounter: Payer: Self-pay | Admitting: Family

## 2015-06-29 DIAGNOSIS — K7460 Unspecified cirrhosis of liver: Secondary | ICD-10-CM | POA: Diagnosis not present

## 2015-06-29 DIAGNOSIS — L602 Onychogryphosis: Secondary | ICD-10-CM

## 2015-06-29 DIAGNOSIS — N2889 Other specified disorders of kidney and ureter: Secondary | ICD-10-CM | POA: Diagnosis not present

## 2015-06-29 DIAGNOSIS — I151 Hypertension secondary to other renal disorders: Secondary | ICD-10-CM | POA: Diagnosis not present

## 2015-06-29 DIAGNOSIS — Z992 Dependence on renal dialysis: Secondary | ICD-10-CM

## 2015-06-29 DIAGNOSIS — B1910 Unspecified viral hepatitis B without hepatic coma: Secondary | ICD-10-CM | POA: Diagnosis not present

## 2015-06-29 DIAGNOSIS — N186 End stage renal disease: Secondary | ICD-10-CM | POA: Diagnosis not present

## 2015-06-29 DIAGNOSIS — L609 Nail disorder, unspecified: Secondary | ICD-10-CM | POA: Diagnosis not present

## 2015-06-29 NOTE — Progress Notes (Signed)
Patient ID: MARGUERITTE GUTHRIDGE, female   DOB: [birth year], 70 y.o.   MRN: [id number]  Location:  [facility] Room Number: 1204 Place of Service:  SNF [id number]  Provider: Oneal Grout, MD   PCP: Dorrene German, MD Patient Care Team: Fleet Contras, MD as PCP - General (Internal Medicine) Terrial Rhodes, MD as Consulting Physician (Nephrology)  Extended Emergency Contact Information Primary Emergency Contact: [facility] Address: [address]          [place], [place] [address] [place] Home Phone: [phone] Work Phone: [phone] Mobile Phone: [phone] Relation: Grandson Secondary Emergency Contact: Lorane Gell [place] of [place] Home Phone: [phone] Relation: Relative  Code Status: Full Code  Goals of care:  Advanced Directive information Advanced Directives 06/14/2015  Does patient have an advance directive? Yes  Type of Advance Directive -  Does patient want to make changes to advanced directive? -  Copy of advanced directive(s) in chart? -  Would patient like information on creating an advanced directive? -     Allergies  Allergen Reactions  . Banana Other (See Comments)    Due to dialysis  . Chocolate Other (See Comments)    Due to dialysis  . Fish-Derived Products Other (See Comments)    Due to gout    Chief Complaint  Patient presents with  . Discharge Note    discharge from SNF    HPI:  70 y.o. female seen today at [facility] for discharge home. She is here for short term rehabilitation post hospital admission from 06/14/15-06/18/15 with acute encephalopathy thought to be from hepatic encephalopathy with elevated ammonia level. She was given lactulose. There was concern for rectal bleed and she was seen by GI. She was placed on PPI bid x 2 week, then daily. She has a medical history of HTN, ESRD on HD, Hypothyroidism among others. She is seen in her room today. She complains  of overgrown toenails worst on right second toe.She has worked well with PT/OT now stable for discharge home. She will be discharged home with PT/OT for ROM, exercise, gait stability and muscle strengthening. Home health PT/OT will be arranged by facility social worker prior to discharge. No DME required.     Past Medical History  Diagnosis Date  . Shoulder pain   . Headache(784.0)     occasionally  . Dizziness   . Occasional numbness/prickling/tingling of fingers and toes   . Arthritis     left shoulder  . Joint pain   . Joint swelling   . Gastric ulcer   . Dry skin   . Peripheral edema   . Constipation   . Oligouria   . H/O: GI bleed   . Hepatitis     Hep B  . History of kidney stones   . History of blood transfusion   . Hypertension   . Anemia   . Hypothyroidism     takes Synthroid daily  . Impaired hearing   . Diabetes mellitus     borderline  . Renal disorder     m, w, F     Past Surgical History  Procedure Laterality Date  . Shoulder surgery  3-64yrs ago    right replacement  . Esophagogastroduodenoscopy  06/09/2011    Procedure: ESOPHAGOGASTRODUODENOSCOPY (EGD);  Surgeon: Theda Belfast, MD;  Location: [facility] ENDOSCOPY;  Service: Endoscopy;  Laterality: N/A;  . Cataract surgery      bilateral  . Reverse  shoulder arthroplasty  09/22/2011    Procedure: REVERSE SHOULDER ARTHROPLASTY;  Surgeon: Verlee Rossetti, MD;  Location[facility] OR;  Service: Orthopedics;  Laterality: Left;  left reverse shoulder arthroplasty  . Cholecystectomy  12/26/2011  . Cholecystectomy  12/26/2011    Procedure: LAPAROSCOPIC CHOLECYSTECTOMY;  Surgeon: Atilano Ina, MD,FACS;  Location: [facility] OR;  Service: General;  Laterality: N/A;  . Arteriovenous graft placement Left     forearm  . Shuntogram Left 08/29/2012    Procedure: SHUNTOGRAM;  Surgeon: Fransisco Hertz, MD;  Location: [facility] CATH LAB;  Service: Cardiovascular;  Laterality: Left;  arm  . Revision of arteriovenous goretex graft Left 01/12/2015     Procedure: REVISION OF Left FOREARM ARTERIOVENOUS GORETEX GRAFT;  Surgeon: Fransisco Hertz, MD;  Location: [facility] OR;  Service: Vascular;  Laterality: Left;  . Insertion of dialysis catheter Right 01/12/2015    Procedure: INSERTION OF DIALYSIS CATHETER;  Surgeon: Fransisco Hertz, MD;  Location: [facility] OR;  Service: Vascular;  Laterality: Right;  . I&d extremity Left 03/23/2015    Procedure: DRAINAGE OF LEFT ARM SEROMA;  Surgeon: Fransisco Hertz, MD;  Location: [facility] OR;  Service: Vascular;  Laterality: Left;  . Ligation arteriovenous gortex graft Left 03/23/2015    Procedure: LIGATION AND EXCISION OF LEFT ARTERIOVENOUS GORTEX GRAFT;  Surgeon: Fransisco Hertz, MD;  Location: [facility] OR;  Service: Vascular;  Laterality: Left;      reports that she has never smoked. Her smokeless tobacco use includes Chew. She reports that she does not drink alcohol or use illicit drugs. Social History   Social History  . Marital Status: Widowed    Spouse Name: N/A  . Number of Children: N/A  . Years of Education: N/A   Occupational History  . Not on file.   Social History Main Topics  . Smoking status: Never Smoker   . Smokeless tobacco: Current User    Types: Chew  . Alcohol Use: No     Comment: quit 4-26yrs ago  . Drug Use: No  . Sexual Activity: No   Other Topics Concern  . Not on file   Social History Narrative   Functional Status Survey:    Allergies  Allergen Reactions  . Banana Other (See Comments)    Due to dialysis  . Chocolate Other (See Comments)    Due to dialysis  . Fish-Derived Products Other (See Comments)    Due to gout    Pertinent  Health Maintenance Due  Topic Date Due  . MAMMOGRAM  01/13/1994  . COLONOSCOPY  01/13/1994  . DEXA SCAN  01/13/2009  . PNA vac Low Risk Adult (1 of 2 - PCV13) 01/13/2009  . INFLUENZA VACCINE  11/16/2014    Medications:   Medication List       This list is accurate as of: 06/29/15  8:24 PM.  Always use your most recent med list.               acetaminophen  500 MG tablet  Commonly known as:  TYLENOL  Take 500 mg by mouth every 6 (six) hours as needed for moderate pain.     lactulose 10 GM/15ML solution  Commonly known as:  CHRONULAC  Take 45 mLs (30 g total) by mouth 2 (two) times daily.     levothyroxine 50 MCG tablet  Commonly known as:  SYNTHROID, LEVOTHROID  Take 50 mcg by mouth daily before breakfast.     Melatonin 3 MG Caps  Take 1 capsule  by mouth at bedtime.     midodrine 10 MG tablet  Commonly known as:  PROAMATINE  Take 1 tablet (10 mg total) by mouth every Monday, Wednesday, and Friday with hemodialysis.     pantoprazole 40 MG tablet  Commonly known as:  PROTONIX  Take 1 tablet (40 mg total) by mouth 2 (two) times daily before a meal.        Review of Systems  Constitutional: Negative for fever, chills, activity change, appetite change and fatigue.  HENT: Positive for hearing loss. Negative for congestion, ear pain, sinus pressure, sneezing and sore throat.   Eyes: Negative.   Respiratory: Negative for cough, chest tightness, shortness of breath and wheezing.   Cardiovascular: Negative for chest pain, palpitations and leg swelling.  Gastrointestinal: Negative for nausea, vomiting, abdominal pain, diarrhea, constipation and abdominal distention.  Endocrine: Negative.   Genitourinary: Negative.   Musculoskeletal: Positive for gait problem.       Overgrown toenails  Skin: Negative.   Allergic/Immunologic: Negative.   Neurological: Negative.   Hematological: Negative.   Psychiatric/Behavioral: Negative for hallucinations, behavioral problems, confusion, sleep disturbance and agitation.    Filed Vitals:   06/29/15 1340  BP: 138/62  Pulse: 71  Temp: 96.9 F (36.1 C)  TempSrc: Oral  Resp: 20  Height: 4\' 9"  (1.448 m)  Weight: 181 lb (82.101 kg)  SpO2: 98%   Body mass index is 39.16 kg/(m^2). Physical Exam  Constitutional: She appears well-developed and well-nourished. No distress.  HENT:  Head:  Normocephalic.  Mouth/Throat: Oropharynx is clear and moist.  Eyes: Conjunctivae and EOM are normal. Pupils are equal, round, and reactive to light. Right eye exhibits no discharge. Left eye exhibits no discharge. No scleral icterus.  Neck: Normal range of motion. No JVD present. No thyromegaly present.  Cardiovascular: Normal rate, regular rhythm, normal heart sounds and intact distal pulses.  Exam reveals no gallop and no friction rub.   No murmur heard. Pulmonary/Chest: Effort normal and breath sounds normal. No respiratory distress. She has no wheezes. She has no rales.  Abdominal: Soft. Bowel sounds are normal. She exhibits no distension and no mass. There is no tenderness. There is no rebound and no guarding.  Musculoskeletal: Normal range of motion. She exhibits no edema or tenderness.  Overgrown toenails  Lymphadenopathy:    She has no cervical adenopathy.  Neurological: She is alert.  Skin: Skin is warm and dry. No rash noted. No erythema. No pallor.  Psychiatric: She has a normal mood and affect.    Labs reviewed: Basic Metabolic Panel:  Recent Labs  16/10/96 0513  06/16/15 0402 06/17/15 0715 06/18/15 06/18/15 1207  NA 135  < > 140 134* 129* 129*  K 4.6  < > 3.2* 3.8  --  3.0*  CL 100*  < > 106 99*  --  96*  CO2 18*  < > 20* 20*  --  19*  GLUCOSE 71  < > 88 80  --  67  BUN 51*  < > 28* 14 25* 25*  CREATININE 9.52*  < > 8.60* 6.05* 8.7* 8.70*  CALCIUM 6.6*  < > 8.9 8.9  --  8.6*  MG 2.0  --   --   --   --   --   PHOS 6.6*  --   --   --   --  4.9*  < > = values in this interval not displayed. Liver Function Tests:  Recent Labs  12/27/14 0530 06/14/15 1833 06/15/15 0400  06/18/15 1207  AST 64* 69* 78*  --   ALT 6* 35 39  --   ALKPHOS 95 112 95  --   BILITOT 1.3* 1.0 1.2  --   PROT 5.7* 9.1* 7.7  --   ALBUMIN 2.1* 3.5 3.1* 2.8*   No results for input(s): LIPASE, AMYLASE in the last 8760 hours.  Recent Labs  12/25/14 2318 12/26/14 0121 06/14/15 2051    AMMONIA 47* 99* 73*   CBC:  Recent Labs  12/25/14 2005  06/14/15 1833  06/16/15 0402 06/16/15 1547 06/17/15 06/17/15 0715  WBC 2.5*  < > 2.4*  < > 2.1* 2.5* 2.8 2.8*  NEUTROABS 1.0*  --  1.4*  --   --   --   --  1.1*  HGB 10.8*  < > 12.4  < > 11.0* 12.9  --  11.5*  HCT 30.6*  < > 36.2  < > 33.6* 38.4  --  34.3*  MCV 97.1  < > 92.6  < > 95.5 94.3  --  95.0  PLT 71*  < > 89*  < > 68* 71*  --  65*  < > = values in this interval not displayed. Cardiac Enzymes: No results for input(s): CKTOTAL, CKMB, CKMBINDEX, TROPONINI in the last 8760 hours. BNP: Invalid input(s): POCBNP CBG:  Recent Labs  06/17/15 0739 06/18/15 0831 06/19/15 0730  GLUCAP 76 85 75    Procedures and Imaging Studies During Stay: Ct Abdomen Pelvis Wo Contrast  06/14/2015  CLINICAL DATA:  Abdominal pain.  Rectal bleeding. EXAM: CT ABDOMEN AND PELVIS WITHOUT CONTRAST TECHNIQUE: Multidetector CT imaging of the abdomen and pelvis was performed following the standard protocol without IV contrast. COMPARISON:  12/26/2014 FINDINGS: Lower chest: Lung bases are clear. Dialysis catheter tip in the right atrium. Mitral annular calcifications. Hepatobiliary: Liver is diffusely nodular and compatible with cirrhosis. Gallbladder has been removed. Common bile duct roughly measures 1 cm and similar to the previous examination. Pancreas: No gross abnormality to the pancreas. Spleen: Spleen is prominent and similar to the previous exam. Again noted are enlarged vascular structures around the spleen which drain into the left renal vein. Known varices around the stomach and distal esophagus are poorly characterized on this examination. Adrenals/Urinary Tract: No acute abnormality to the adrenal glands. Bilateral atrophic kidneys compatible with end-stage renal disease. Evidence for at least 2 cysts in the left kidney. No hydronephrosis. Urinary bladder is decompressed. Stomach/Bowel: Difficult to exclude wall thickening in the rectal  region. There is no significant pericolonic inflammation. Mild mesenteric edema is nonspecific. No evidence for a bowel obstruction. Vascular/Lymphatic: Atherosclerotic calcifications in the aorta without aneurysm. No significant lymphadenopathy. Reproductive: Again noted is a low-density structure in the left adnexa. This structure now measures up to 3.5 cm and previously measured 2.5 cm. No gross abnormality to the uterus. Other: There is no significant free fluid or ascites. Musculoskeletal: The bony structures are very heterogeneous with patchy areas of sclerosis. Again noted is severe disc disease at L2-L3 with multiple Schmorl's nodes and vacuum phenomenon. IMPRESSION: No acute abnormality within the abdomen or pelvis. Changes compatible with end-stage renal disease. 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 Persistent left adnexal cystic structure. However, this structure may be enlarging. Consider a non-emergent pelvic ultrasound to ensure that this has benign characteristics in a postmenopausal female. 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 Electronically Signed   By: Richarda Overlie M.D.   On: 06/14/2015 21:16   Ct Head Wo Contrast  06/14/2015  CLINICAL DATA:  Altered mental status.  Unsteady gait.  Confusion. EXAM: CT HEAD WITHOUT CONTRAST TECHNIQUE: Contiguous axial images were obtained from the base of the skull through the vertex without intravenous contrast. COMPARISON:  None. FINDINGS: Sinuses/Soft tissues: renal osteodystrophy. Clear paranasal sinuses and mastoid air cells. Intracranial: Age advanced cerebral atrophy. Moderate low density in the periventricular white matter likely related to small vessel disease. No mass lesion, hemorrhage, hydrocephalus, acute infarct, intra-axial, or extra-axial fluid collection. IMPRESSION: 1.  No acute intracranial abnormality. 2.  Cerebral atrophy and small vessel ischemic change. Electronically Signed   By: Jeronimo GreavesKyle  Talbot M.D.   On: 06/14/2015 21:05    [place] Pelvis Complete  06/15/2015  CLINICAL DATA:  Left adnexal mass on recent CT EXAM: TRANSABDOMINAL ULTRASOUND OF PELVIS TECHNIQUE: Transabdominal ultrasound examination of the pelvis was performed including evaluation of the uterus, ovaries, adnexal regions, and pelvic cul-de-sac. COMPARISON:  CT abdomen and pelvis June 14, 2015 FINDINGS: Uterus Measurements: 7.2 x 3.7 x 4.3 cm. No fibroids or other mass visualized. Endometrium Thickness: 17 mm. Endometrium appears irregular in contour. There is inhomogeneous echotexture within the endometrium with a questionable 6 mm polyp in the mid endometrial region. Right ovary Measurements: 2.5 x 1.7 x 2.0 cm. Normal appearance/no adnexal mass. Left ovary Measurements: 5.1 x 2.9 x 4.1 cm. There is a cystic structure arising from the left ovary with mildly thickened wall measuring 2.1 x 3.3 x 2.0 cm. Other findings:  No abnormal free fluid. IMPRESSION: Thickened endometrium for age with irregular contour and questionable mid endometrial polyp. Endometrial thickness is considered abnormal for an asymptomatic post-menopausal female. Endometrial sampling should be considered to exclude carcinoma. Cystic structure with mildly thickened wall measuring 2.1 x 3.3 x 2.0 cm arising from left ovary region. Question ovarian cyst or possibly ovarian cystadenoma. As this structure cannot be classified as a simple ovarian cyst, a followup pelvic ultrasound in approximately 6 months is advised to assess for stability. These results will be called to the ordering clinician or representative by the Radiologist Assistant, and communication documented in the PACS or zVision Dashboard. Electronically Signed   By: Bretta BangWilliam  Woodruff III M.D.   On: 06/15/2015 15:29   Dg Chest Port 1 View  06/14/2015  CLINICAL DATA:  70 year old female with altered mental status and intermittent confusion and unsteady gait EXAM: PORTABLE CHEST 1 VIEW COMPARISON:  Chest radiograph dated 01/12/2015  FINDINGS: Single-view of the chest does not demonstrate focal consolidation. There is no pleural effusion or pneumothorax. There are mild diffuse vascular interstitial prominence, likely mild congestive changes. There is stable mild cardiomegaly. Eight dwell lumen dialysis catheter is noted in stable position. The degenerative changes of the osseous structures with bilateral shoulder arthroplasties. No acute fracture. IMPRESSION: Stable appearing minimal congestive changes. No focal consolidation. Electronically Signed   By: Elgie CollardArash  Radparvar M.D.   On: 06/14/2015 20:06    Assessment/Plan:   1. Hypertension secondary to other renal disorders B/p stable. Continue to monitor.   2. 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   3. ESRD on hemodialysis (HCC) Continue with dialysis Mon, Wed and Fri. Currently on Midodrine for Hypotension associated with dialysis.  4. Hypothyroidism Continue on Levothyroxine 50 mcg Tablet 5. Abnormal Gait  Has improved with PT/OT will discharge home with PT/OT for ROM, exercise, gait stability and muscle  strengthening.  6. Overgrown Toenails Follow up with PCP for out patient podiatrist referral    Patient is being discharged with the following home health services:     PT/OT for ROM, exercise, gait stability and muscle strengthening.  Patient is being discharged with the following durable medical equipment:   No DME   RX written for one month.   Patient has been advised to f/u with their PCP in 1-2 weeks to bring them up to date on their rehab stay.  Social services at facility was responsible for arranging this appointment.  Pt was provided with a 30 day supply of prescriptions for medications and refills must be obtained from their PCP.  For  controlled substances, a more limited supply may be provided adequate until PCP appointment only.  Future labs/tests needed: Ammonia level with PCP

## 2015-06-29 NOTE — Progress Notes (Signed)
Patient ID: Samantha [name], female   DOB: [birth year], 70 y.o.   MRN: [id number]

## 2015-06-30 ENCOUNTER — Other Ambulatory Visit: Payer: Self-pay | Admitting: Gastroenterology

## 2015-07-02 ENCOUNTER — Encounter: Payer: Medicare Other | Admitting: Obstetrics & Gynecology

## 2015-07-09 ENCOUNTER — Encounter (HOSPITAL_COMMUNITY): Payer: Self-pay | Admitting: *Deleted

## 2015-07-16 ENCOUNTER — Ambulatory Visit (HOSPITAL_COMMUNITY): Admission: RE | Admit: 2015-07-16 | Payer: Medicare Other | Source: Ambulatory Visit | Admitting: Gastroenterology

## 2015-07-16 SURGERY — ESOPHAGOGASTRODUODENOSCOPY (EGD) WITH PROPOFOL
Anesthesia: Monitor Anesthesia Care

## 2015-08-14 ENCOUNTER — Emergency Department (HOSPITAL_COMMUNITY): Payer: Medicare Other

## 2015-08-14 ENCOUNTER — Other Ambulatory Visit: Payer: Self-pay

## 2015-08-14 ENCOUNTER — Inpatient Hospital Stay (HOSPITAL_COMMUNITY)
Admission: EM | Admit: 2015-08-14 | Discharge: 2015-08-20 | DRG: 314 | Disposition: A | Discharge status: Skilled Nursing Facility | Payer: Medicare Other | Attending: Internal Medicine | Admitting: Internal Medicine

## 2015-08-14 ENCOUNTER — Encounter (HOSPITAL_COMMUNITY): Payer: Self-pay | Admitting: Emergency Medicine

## 2015-08-14 DIAGNOSIS — R109 Unspecified abdominal pain: Secondary | ICD-10-CM | POA: Diagnosis not present

## 2015-08-14 DIAGNOSIS — K7460 Unspecified cirrhosis of liver: Secondary | ICD-10-CM | POA: Diagnosis present

## 2015-08-14 DIAGNOSIS — D631 Anemia in chronic kidney disease: Secondary | ICD-10-CM

## 2015-08-14 DIAGNOSIS — D696 Thrombocytopenia, unspecified: Secondary | ICD-10-CM | POA: Diagnosis present

## 2015-08-14 DIAGNOSIS — I9589 Other hypotension: Secondary | ICD-10-CM | POA: Diagnosis not present

## 2015-08-14 DIAGNOSIS — I5032 Chronic diastolic (congestive) heart failure: Secondary | ICD-10-CM | POA: Diagnosis present

## 2015-08-14 DIAGNOSIS — E869 Volume depletion, unspecified: Secondary | ICD-10-CM | POA: Diagnosis present

## 2015-08-14 DIAGNOSIS — N186 End stage renal disease: Secondary | ICD-10-CM

## 2015-08-14 DIAGNOSIS — N939 Abnormal uterine and vaginal bleeding, unspecified: Secondary | ICD-10-CM

## 2015-08-14 DIAGNOSIS — I132 Hypertensive heart and chronic kidney disease with heart failure and with stage 5 chronic kidney disease, or end stage renal disease: Secondary | ICD-10-CM | POA: Diagnosis present

## 2015-08-14 DIAGNOSIS — E876 Hypokalemia: Secondary | ICD-10-CM | POA: Diagnosis present

## 2015-08-14 DIAGNOSIS — Z833 Family history of diabetes mellitus: Secondary | ICD-10-CM

## 2015-08-14 DIAGNOSIS — Z96641 Presence of right artificial hip joint: Secondary | ICD-10-CM | POA: Diagnosis present

## 2015-08-14 DIAGNOSIS — Z9049 Acquired absence of other specified parts of digestive tract: Secondary | ICD-10-CM

## 2015-08-14 DIAGNOSIS — Z96611 Presence of right artificial shoulder joint: Secondary | ICD-10-CM | POA: Diagnosis present

## 2015-08-14 DIAGNOSIS — R42 Dizziness and giddiness: Secondary | ICD-10-CM | POA: Diagnosis present

## 2015-08-14 DIAGNOSIS — N2581 Secondary hyperparathyroidism of renal origin: Secondary | ICD-10-CM | POA: Diagnosis present

## 2015-08-14 DIAGNOSIS — N189 Chronic kidney disease, unspecified: Secondary | ICD-10-CM

## 2015-08-14 DIAGNOSIS — K7030 Alcoholic cirrhosis of liver without ascites: Secondary | ICD-10-CM

## 2015-08-14 DIAGNOSIS — E039 Hypothyroidism, unspecified: Secondary | ICD-10-CM | POA: Diagnosis present

## 2015-08-14 DIAGNOSIS — I248 Other forms of acute ischemic heart disease: Secondary | ICD-10-CM | POA: Diagnosis present

## 2015-08-14 DIAGNOSIS — G8929 Other chronic pain: Secondary | ICD-10-CM | POA: Diagnosis present

## 2015-08-14 DIAGNOSIS — Z8249 Family history of ischemic heart disease and other diseases of the circulatory system: Secondary | ICD-10-CM

## 2015-08-14 DIAGNOSIS — H9190 Unspecified hearing loss, unspecified ear: Secondary | ICD-10-CM | POA: Diagnosis present

## 2015-08-14 DIAGNOSIS — I959 Hypotension, unspecified: Secondary | ICD-10-CM | POA: Diagnosis not present

## 2015-08-14 DIAGNOSIS — R4789 Other speech disturbances: Secondary | ICD-10-CM

## 2015-08-14 DIAGNOSIS — Z79899 Other long term (current) drug therapy: Secondary | ICD-10-CM

## 2015-08-14 DIAGNOSIS — G9340 Encephalopathy, unspecified: Secondary | ICD-10-CM | POA: Diagnosis present

## 2015-08-14 DIAGNOSIS — Z91018 Allergy to other foods: Secondary | ICD-10-CM

## 2015-08-14 DIAGNOSIS — Z66 Do not resuscitate: Secondary | ICD-10-CM | POA: Diagnosis present

## 2015-08-14 DIAGNOSIS — E871 Hypo-osmolality and hyponatremia: Secondary | ICD-10-CM

## 2015-08-14 DIAGNOSIS — M1990 Unspecified osteoarthritis, unspecified site: Secondary | ICD-10-CM | POA: Diagnosis present

## 2015-08-14 DIAGNOSIS — R938 Abnormal findings on diagnostic imaging of other specified body structures: Secondary | ICD-10-CM | POA: Diagnosis present

## 2015-08-14 DIAGNOSIS — D6959 Other secondary thrombocytopenia: Secondary | ICD-10-CM | POA: Diagnosis present

## 2015-08-14 DIAGNOSIS — Z96612 Presence of left artificial shoulder joint: Secondary | ICD-10-CM | POA: Diagnosis present

## 2015-08-14 DIAGNOSIS — Z992 Dependence on renal dialysis: Secondary | ICD-10-CM

## 2015-08-14 LAB — CBC WITH DIFFERENTIAL/PLATELET
BASOS PCT: 1 %
Basophils Absolute: 0.1 10*3/uL (ref 0.0–0.1)
Eosinophils Absolute: 0.2 10*3/uL (ref 0.0–0.7)
Eosinophils Relative: 5 %
HEMATOCRIT: 34.9 % — AB (ref 36.0–46.0)
Hemoglobin: 11.4 g/dL — ABNORMAL LOW (ref 12.0–15.0)
Lymphocytes Relative: 40 %
Lymphs Abs: 1.5 10*3/uL (ref 0.7–4.0)
MCH: 32.4 pg (ref 26.0–34.0)
MCHC: 32.7 g/dL (ref 30.0–36.0)
MCV: 99.1 fL (ref 78.0–100.0)
MONO ABS: 0.5 10*3/uL (ref 0.1–1.0)
MONOS PCT: 13 %
NEUTROS ABS: 1.5 10*3/uL — AB (ref 1.7–7.7)
Neutrophils Relative %: 41 %
Platelets: 71 10*3/uL — ABNORMAL LOW (ref 150–400)
RBC: 3.52 MIL/uL — ABNORMAL LOW (ref 3.87–5.11)
RDW: 16.5 % — AB (ref 11.5–15.5)
WBC: 3.7 10*3/uL — ABNORMAL LOW (ref 4.0–10.5)

## 2015-08-14 LAB — COMPREHENSIVE METABOLIC PANEL
ALK PHOS: 87 U/L (ref 38–126)
ALT: 32 U/L (ref 14–54)
AST: 74 U/L — ABNORMAL HIGH (ref 15–41)
Albumin: 3 g/dL — ABNORMAL LOW (ref 3.5–5.0)
Anion gap: 17 — ABNORMAL HIGH (ref 5–15)
BILIRUBIN TOTAL: 1.4 mg/dL — AB (ref 0.3–1.2)
BUN: 25 mg/dL — ABNORMAL HIGH (ref 6–20)
CALCIUM: 9.9 mg/dL (ref 8.9–10.3)
CO2: 20 mmol/L — ABNORMAL LOW (ref 22–32)
CREATININE: 6.47 mg/dL — AB (ref 0.44–1.00)
Chloride: 90 mmol/L — ABNORMAL LOW (ref 101–111)
GFR, EST AFRICAN AMERICAN: 7 mL/min — AB (ref >60)
GFR, EST NON AFRICAN AMERICAN: 6 mL/min — AB (ref >60)
Glucose, Bld: 98 mg/dL (ref 65–99)
Potassium: 3.9 mmol/L (ref 3.5–5.1)
Sodium: 127 mmol/L — ABNORMAL LOW (ref 135–145)
TOTAL PROTEIN: 7.6 g/dL (ref 6.5–8.1)

## 2015-08-14 LAB — LIPASE, BLOOD: LIPASE: 141 U/L — AB (ref 11–51)

## 2015-08-14 LAB — POC OCCULT BLOOD, ED: FECAL OCCULT BLD: NEGATIVE

## 2015-08-14 LAB — TROPONIN I: TROPONIN I: 0.21 ng/mL — AB (ref <0.031)

## 2015-08-14 LAB — CBG MONITORING, ED: Glucose-Capillary: 80 mg/dL (ref 65–99)

## 2015-08-14 MED ORDER — MORPHINE SULFATE (PF) 2 MG/ML IV SOLN: 2.0000 mg | INTRAVENOUS | Status: DC | PRN

## 2015-08-14 MED ORDER — PANTOPRAZOLE SODIUM 40 MG PO TBEC
40.0000 mg | DELAYED_RELEASE_TABLET | Freq: Two times a day (BID) | ORAL | Status: DC
  Administered 2015-08-15 – 2015-08-19 (×9): 40 mg via ORAL
  Filled 2015-08-14 (×8): qty 1

## 2015-08-14 MED ORDER — ACETAMINOPHEN 500 MG PO TABS: 500.0000 mg | ORAL_TABLET | Freq: Four times a day (QID) | ORAL | Status: DC | PRN

## 2015-08-14 MED ORDER — GI COCKTAIL ~~LOC~~: 30.0000 mL | Freq: Four times a day (QID) | ORAL | Status: DC | PRN

## 2015-08-14 MED ORDER — LEVOTHYROXINE SODIUM 50 MCG PO TABS
50.0000 ug | ORAL_TABLET | Freq: Every day | ORAL | Status: DC
  Administered 2015-08-15 – 2015-08-20 (×6): 50 ug via ORAL
  Filled 2015-08-14 (×6): qty 1

## 2015-08-14 MED ORDER — HEPARIN SODIUM (PORCINE) 5000 UNIT/ML IJ SOLN: 5000.0000 [IU] | Freq: Three times a day (TID) | INTRAMUSCULAR | Status: DC

## 2015-08-14 MED ORDER — LACTULOSE 10 GM/15ML PO SOLN
30.0000 g | Freq: Two times a day (BID) | ORAL | Status: DC
  Administered 2015-08-15 – 2015-08-16 (×4): 30 g via ORAL
  Filled 2015-08-14 (×4): qty 45

## 2015-08-14 MED ORDER — ASPIRIN 81 MG PO CHEW
324.0000 mg | CHEWABLE_TABLET | Freq: Once | ORAL | Status: AC
  Administered 2015-08-14: 324 mg via ORAL
  Filled 2015-08-14: qty 4

## 2015-08-14 MED ORDER — MIDODRINE HCL 5 MG PO TABS: 10.0000 mg | ORAL_TABLET | ORAL | Status: DC

## 2015-08-14 MED ORDER — ONDANSETRON HCL 4 MG/2ML IJ SOLN: 4.0000 mg | Freq: Four times a day (QID) | INTRAMUSCULAR | Status: DC | PRN

## 2015-08-14 MED ORDER — ACETAMINOPHEN 325 MG PO TABS: 650.0000 mg | ORAL_TABLET | ORAL | Status: DC | PRN

## 2015-08-14 MED ORDER — HYDROCODONE-ACETAMINOPHEN 5-325 MG PO TABS
1.0000 | ORAL_TABLET | Freq: Once | ORAL | Status: AC
  Administered 2015-08-14: 1 via ORAL
  Filled 2015-08-14: qty 1

## 2015-08-14 MED ORDER — HYDROCODONE-ACETAMINOPHEN 5-325 MG PO TABS
1.0000 | ORAL_TABLET | Freq: Four times a day (QID) | ORAL | Status: DC | PRN
  Administered 2015-08-16 – 2015-08-18 (×2): 1 via ORAL
  Filled 2015-08-14 (×2): qty 1

## 2015-08-14 MED ORDER — SODIUM CHLORIDE 0.9 % IV BOLUS (SEPSIS)
500.0000 mL | Freq: Once | INTRAVENOUS | Status: AC
  Administered 2015-08-14: 500 mL via INTRAVENOUS

## 2015-08-14 NOTE — ED Provider Notes (Addendum)
CSN: [id number]     Arrival date & time 08/14/15  1743 History   First MD Initiated Contact with Patient 08/14/15 1752     Chief Complaint  Patient presents with  . Hypotension  . Shortness of Breath     (Consider location/radiation/quality/duration/timing/severity/associated sxs/prior Treatment) HPI  70 year old female presents with abdominal pain and dizziness. The history is very limited due to the patient's extremely hard hearing. Family provides most of the history. She is on dialysis, most recently had dialysis yesterday. Son called for EMS due to abdominal pain. She has a chronic history of abdominal pain. Unclear if there are any new symptoms. However EMS noted her blood pressure was in the 70s. She was also complaining of dizziness. She has been complaining of this but it is unclear exactly how long. She is on Midodrine for low BP when at dialysyis. Has also complained of dyspnea on and off for 1.5 weeks. Was told by renal that it was from drinking too much fluid. No reports of chest pain. No reports of blood in stool. She has been having vomiting with eating. Unclear for how long but patient has told family she cannot keep anything down.  Pharmacy tech tells me she is still taking metoprolol per family, although she was instructed to stop in February.  Past Medical History  Diagnosis Date  . Shoulder pain   . Headache(784.0)     occasionally  . Dizziness   . Occasional numbness/prickling/tingling of fingers and toes   . Arthritis     left shoulder  . Joint pain   . Joint swelling   . Gastric ulcer   . Dry skin   . Peripheral edema   . Constipation   . Oligouria   . H/O: GI bleed   . Hepatitis     Hep B  . History of kidney stones   . History of blood transfusion   . Hypertension   . Anemia   . Hypothyroidism     takes Synthroid daily  . Impaired hearing   . Diabetes mellitus     borderline  . Renal disorder     m, w, F    Past Surgical History  Procedure  Laterality Date  . Shoulder surgery  3-7017yrs ago    right replacement  . Esophagogastroduodenoscopy  06/09/2011    Procedure: ESOPHAGOGASTRODUODENOSCOPY (EGD);  Surgeon: Theda BelfastPatrick D Hung, MD;  [facility];  Service: Endoscopy;  Laterality: N/A;  . Cataract surgery      bilateral  . Reverse shoulder arthroplasty  09/22/2011    Procedure: REVERSE SHOULDER ARTHROPLASTY;  Surgeon: Verlee RossettiSteven R Norris, MD;  [facility];  Service: Orthopedics;  Laterality: Left;  left reverse shoulder arthroplasty  . Cholecystectomy  12/26/2011  . Cholecystectomy  12/26/2011    Procedure: LAPAROSCOPIC CHOLECYSTECTOMY;  Surgeon: Atilano InaEric M Wilson, MD,FACS;  Location: [facility] OR;  Service: General;  Laterality: N/A;  . Arteriovenous graft placement Left     forearm  . Shuntogram Left 08/29/2012    Procedure: SHUNTOGRAM;  Surgeon: Fransisco HertzBrian L Chen, MD;  [facility];  Service: Cardiovascular;  Laterality: Left;  arm  . Revision of arteriovenous goretex graft Left 01/12/2015    Procedure: REVISION OF Left FOREARM ARTERIOVENOUS GORETEX GRAFT;  Surgeon: Fransisco HertzBrian L Chen, MD;  [facility];  Service: Vascular;  Laterality: Left;  . Insertion of dialysis catheter Right 01/12/2015    Procedure: INSERTION OF DIALYSIS CATHETER;  Surgeon: Fransisco HertzBrian L Chen, MD;  Location: [facility] OR;  Service: Vascular;  Laterality: Right;  . I&d extremity Left 03/23/2015    Procedure: DRAINAGE OF LEFT ARM SEROMA;  Surgeon: Fransisco Hertz, MD;  Location: [facility] OR;  Service: Vascular;  Laterality: Left;  . Ligation arteriovenous gortex graft Left 03/23/2015    Procedure: LIGATION AND EXCISION OF LEFT ARTERIOVENOUS GORTEX GRAFT;  Surgeon: Fransisco Hertz, MD;  Location: [facility] OR;  Service: Vascular;  Laterality: Left;   Family History  Problem Relation Age of Onset  . Hypertension Mother   . Diabetes Mother   . Cancer Brother    Social History  Substance Use Topics  . Smoking status: Never Smoker   . Smokeless tobacco: Current User    Types: Chew  . Alcohol  Use: No     Comment: quit 4-58yrs ago   OB History    No data available     Review of Systems  Constitutional: Negative for fever.  Respiratory: Positive for shortness of breath.   Cardiovascular: Negative for chest pain.  Gastrointestinal: Positive for nausea, vomiting and abdominal pain.  Neurological: Positive for dizziness.  All other systems reviewed and are negative.     Allergies  Banana; Chocolate; and Fish-derived products  Home Medications   Prior to Admission medications   Medication Sig Start Date End Date Taking? Authorizing Provider  acetaminophen (TYLENOL) 500 MG tablet Take 500 mg by mouth every 6 (six) hours as needed for moderate pain.    Historical Provider, MD  lactulose (CHRONULAC) 10 GM/15ML solution Take 45 mLs (30 g total) by mouth 2 (two) times daily. 06/18/15   Rolly Salter, MD  levothyroxine (SYNTHROID, LEVOTHROID) 50 MCG tablet Take 50 mcg by mouth daily before breakfast.  04/15/15   Historical Provider, MD  Melatonin 3 MG CAPS Take 1 capsule by mouth at bedtime.    Historical Provider, MD  metoprolol (LOPRESSOR) 50 MG tablet Take 50 mg by mouth every morning.    Historical Provider, MD  midodrine (PROAMATINE) 10 MG tablet Take 1 tablet (10 mg total) by mouth every Monday, Wednesday, and Friday with hemodialysis. 06/18/15   Rolly Salter, MD  pantoprazole (PROTONIX) 40 MG tablet Take 1 tablet (40 mg total) by mouth 2 (two) times daily before a meal. 06/18/15   Rolly Salter, MD   BP 62/43 mmHg  Pulse 84  Temp(Src) 97.3 F (36.3 C) (Oral)  Resp 20  SpO2 100% Physical Exam  Constitutional: She is oriented to person, place, and time. She appears well-developed and well-nourished.  Extremely hard of hearing. Resting comfortably, no acute distress  HENT:  Head: Normocephalic and atraumatic.  Right Ear: External ear normal.  Left Ear: External ear normal.  Nose: Nose normal.  Eyes: Right eye exhibits no discharge. Left eye exhibits no discharge.   Cardiovascular: Normal rate, regular rhythm and normal heart sounds.   Pulmonary/Chest: Effort normal and breath sounds normal.  Abdominal: Soft. There is tenderness (diffuse. However on repeat exam I could not repeat this.).  Neurological: She is alert and oriented to person, place, and time.  Skin: Skin is warm and dry.  Nursing note and vitals reviewed.   ED Course  Procedures (including critical care time) Labs Review Labs Reviewed  CBC WITH DIFFERENTIAL/PLATELET - Abnormal; Notable for the following:    WBC 3.7 (*)    RBC 3.52 (*)    Hemoglobin 11.4 (*)    HCT 34.9 (*)    RDW 16.5 (*)    Platelets 71 (*)    Neutro  Abs 1.5 (*)    All other components within normal limits  COMPREHENSIVE METABOLIC PANEL - Abnormal; Notable for the following:    Sodium 127 (*)    Chloride 90 (*)    CO2 20 (*)    BUN 25 (*)    Creatinine, Ser 6.47 (*)    Albumin 3.0 (*)    AST 74 (*)    Total Bilirubin 1.4 (*)    GFR calc non Af Amer 6 (*)    GFR calc Af Amer 7 (*)    Anion gap 17 (*)    All other components within normal limits  LIPASE, BLOOD - Abnormal; Notable for the following:    Lipase 141 (*)    All other components within normal limits  TROPONIN I - Abnormal; Notable for the following:    Troponin I 0.21 (*)    All other components within normal limits  RENAL FUNCTION PANEL  CBC WITH DIFFERENTIAL/PLATELET  CBG MONITORING, ED  POC OCCULT BLOOD, ED    Imaging Review Dg Chest 2 View  08/14/2015  CLINICAL DATA:  Hypotensive.  Diaphysis yesterday EXAM: CHEST  2 VIEW COMPARISON:  06/14/2015 FINDINGS: Right chest wall dialysis catheter is noted with tips in the right atrium. Mild cardiac enlargement. There is mild pulmonary edema. No pleural fluid identified. No airspace consolidation. Previous bilateral shoulder arthroplasty. IMPRESSION: Mild cardiac enlargement and mild interstitial edema. Electronically Signed   By: Signa Kell M.D.   On: 08/14/2015 19:02   Ct Head Wo  Contrast  08/14/2015  CLINICAL DATA:  Headache, dizziness. EXAM: CT HEAD WITHOUT CONTRAST TECHNIQUE: Contiguous axial images were obtained from the base of the skull through the vertex without intravenous contrast. COMPARISON:  CT scan of June 14, 2015. FINDINGS: Bony calvarium appears intact. Mild diffuse cortical atrophy is noted. Mild chronic ischemic white matter disease is noted. No mass effect or midline shift is noted. Ventricular size is within normal limits. There is no evidence of mass lesion, hemorrhage or acute infarction. IMPRESSION: Mild diffuse cortical atrophy. Mild chronic ischemic white matter disease. No acute intracranial abnormality seen. Electronically Signed   By: Lupita Raider, M.D.   On: 08/14/2015 20:51   I have personally reviewed and evaluated these images and lab results as part of my medical decision-making.   EKG Interpretation   Date/Time:  Saturday August 14 2015 19:39:55 EDT Ventricular Rate:  76 PR Interval:  178 QRS Duration: 68 QT Interval:  427 QTC Calculation: 480 R Axis:   42 Text Interpretation:  Sinus rhythm Low voltage, extremity leads  Nonspecific T abnormalities, lateral leads Baseline wander in lead(s) III  V1 nonspecific T waves new compared to 2017 Confirmed by Coree Brame  MD,  Brynlei Klausner ([id number]) on 08/14/2015 10:09:44 PM      Angiocath insertion Performed by: Pricilla Loveless T  Consent: Verbal consent obtained. Risks and benefits: risks, benefits and alternatives were discussed Time out: Immediately prior to procedure a "time out" was called to verify the correct patient, procedure, equipment, support staff and site/side marked as required.  Preparation: Patient was prepped and draped in the usual sterile fashion.  Vein Location: right basilic  Ultrasound Guided  Gauge: 20  Normal blood return and flush without difficulty Patient tolerance: Patient tolerated the procedure well with no immediate complications.   Angiocath  insertion Performed by: Pricilla Loveless T  Consent: Verbal consent obtained. Risks and benefits: risks, benefits and alternatives were discussed Time out: Immediately prior to procedure a "time out" was  called to verify the correct patient, procedure, equipment, support staff and site/side marked as required.  Preparation: Patient was prepped and draped in the usual sterile fashion.  Vein Location: right EJ  Gauge: 20  Normal blood return and flush without difficulty Patient tolerance: Patient tolerated the procedure well with no immediate complications.     MDM   Final diagnoses:  Hypotension, unspecified hypotension type    It is unclear why the patient has been hypotensive. Would be related to dialysis yesterday. Also is apparently still on metoprolol despite being told to stop this during last admission last month. However she's not severely bradycardic to make this look like a beta blocker overdose. It is very hard to get a good history out of the patient but she reports no chest pain. She has been intermittently dizzy and intimately short of breath. ECG with nonspecific changes and a mild, low level troponin. Cardiology consulted. Hospitalist to admit. Patient does not have any infectious symptoms such as cough or obvious rash. She is not altered from baseline per family. I do not think her hypotension is from sepsis. Her abdominal pain is at baseline per family and on distraction I do not get reproducible symptoms. Do not think imaging needed currently    Pricilla Loveless, MD 08/15/15 [id number]  Pricilla Loveless, MD 08/15/15 [phone]

## 2015-08-14 NOTE — ED Notes (Signed)
Pt arrives via [facility], ems reports they were called out for abdominal pain which upon arrival pts family stated is chronic. Ems reports pt also c/o sob and dizziness, pt was found to be hypotensive at 77/46 which family reports is not abnormal for patient, pt was dialyzed yesterday. Pt a/ox 4, nad.

## 2015-08-14 NOTE — ED Notes (Signed)
Pt's IV in right arm had infiltrated, RN removed IV and informed Provider.

## 2015-08-14 NOTE — H&P (Addendum)
History and Physical    Samantha [name] ZOX:[id number] DOB: 05/19/1945 DOA: 08/14/2015  Referring MD/NP/PA: Dr. Gwenlyn Fudge PCP: Dorrene German, MD  Outpatient Specialists: Dr. Imogene Burn vascular surgery, Patient coming from: Home  Chief Complaint: Abdominal pain and dizziness.  HPI: Samantha [name] is a 70 y.o. female with medical history significant of ESRD on HD(M/W/F), hypothyroidism, hypotension, cirrhosis, osteoarthritis, HTN, anemia, dizziness; who presents with complaints of abdominal pain and dizziness. Patient is difficult to obtain history from secondary to hearing deficits. She complains of epigastric and lower abdominal pain and dizziness. Patient's had similar complaint when reviewing previous admissions. Denies that that she still makes urine. She states that she is been bleeding in her panties and doesn't know why. Unsure how long this issue has been occurring. There was a question if the patient was still taking metoprolol although it appears upon her last discharge summary from 05/2015 this medication was stopped. She currently is advised to take Midodrine on days of hemodialysis secondary to hypotension.   ED Course: Upon evaluation to the emergency department patient's blood pressures were noted to be as low as 62/43. Blood pressure still in the low 80s after a total of 1 L of IV fluids.  Patient underwent CT of the brain without contrast showed no acute abnormalities. She was initially sent to a telemetry before orders could be changed to stepdown for systolic hypotension.  Review of Systems: As per HPI otherwise 10 point review of systems negative.    Past Medical History  Diagnosis Date  . Shoulder pain   . Headache(784.0)     occasionally  . Dizziness   . Occasional numbness/prickling/tingling of fingers and toes   . Arthritis     left shoulder  . Joint pain   . Joint swelling   . Gastric ulcer   . Dry skin   . Peripheral edema   . Constipation   . Oligouria   .  H/O: GI bleed   . Hepatitis     Hep B  . History of kidney stones   . History of blood transfusion   . Hypertension   . Anemia   . Hypothyroidism     takes Synthroid daily  . Impaired hearing   . Diabetes mellitus     borderline  . Renal disorder     m, w, F     Past Surgical History  Procedure Laterality Date  . Shoulder surgery  3-44yrs ago    right replacement  . Esophagogastroduodenoscopy  06/09/2011    Procedure: ESOPHAGOGASTRODUODENOSCOPY (EGD);  Surgeon: Theda Belfast, MD;  Location: [facility] ENDOSCOPY;  Service: Endoscopy;  Laterality: N/A;  . Cataract surgery      bilateral  . Reverse shoulder arthroplasty  09/22/2011    Procedure: REVERSE SHOULDER ARTHROPLASTY;  Surgeon: Verlee Rossetti, MD;  Location: [facility] OR;  Service: Orthopedics;  Laterality: Left;  left reverse shoulder arthroplasty  . Cholecystectomy  12/26/2011  . Cholecystectomy  12/26/2011    Procedure: LAPAROSCOPIC CHOLECYSTECTOMY;  Surgeon: Atilano Ina, MD,FACS;  Location: [facility] OR;  Service: General;  Laterality: N/A;  . Arteriovenous graft placement Left     forearm  . Shuntogram Left 08/29/2012    Procedure: SHUNTOGRAM;  Surgeon: Fransisco Hertz, MD;  Location: [facility] CATH LAB;  Service: Cardiovascular;  Laterality: Left;  arm  . Revision of arteriovenous goretex graft Left 01/12/2015    Procedure: REVISION OF Left FOREARM ARTERIOVENOUS GORETEX GRAFT;  Surgeon: Fransisco Hertz, MD;  Location: [facility]  OR;  Service: Vascular;  Laterality: Left;  . Insertion of dialysis catheter Right 01/12/2015    Procedure: INSERTION OF DIALYSIS CATHETER;  Surgeon: Fransisco Hertz, MD;  Location: [facility] OR;  Service: Vascular;  Laterality: Right;  . I&d extremity Left 03/23/2015    Procedure: DRAINAGE OF LEFT ARM SEROMA;  Surgeon: Fransisco Hertz, MD;  Location: [facility] OR;  Service: Vascular;  Laterality: Left;  . Ligation arteriovenous gortex graft Left 03/23/2015    Procedure: LIGATION AND EXCISION OF LEFT ARTERIOVENOUS GORTEX GRAFT;  Surgeon: Fransisco Hertz, MD;   Location: [facility] OR;  Service: Vascular;  Laterality: Left;     reports that she has never smoked. Her smokeless tobacco use includes Chew. She reports that she does not drink alcohol or use illicit drugs.  Allergies  Allergen Reactions  . Banana Other (See Comments)    Due to dialysis  . Chocolate Other (See Comments)    Due to dialysis  . Fish-Derived Products Other (See Comments)    Due to gout    Family History  Problem Relation Age of Onset  . Hypertension Mother   . Diabetes Mother   . Cancer Brother     Prior to Admission medications   Medication Sig Start Date End Date Taking? Authorizing Provider  acetaminophen (TYLENOL) 500 MG tablet Take 500 mg by mouth every 6 (six) hours as needed for moderate pain.   Yes Historical Provider, MD  HYDROcodone-acetaminophen (NORCO/VICODIN) 5-325 MG tablet Take 1 tablet by mouth every 6 (six) hours as needed for moderate pain.   Yes Historical Provider, MD  lactulose (CHRONULAC) 10 GM/15ML solution Take 45 mLs (30 g total) by mouth 2 (two) times daily. 06/18/15  Yes Rolly Salter, MD  levothyroxine (SYNTHROID, LEVOTHROID) 50 MCG tablet Take 50 mcg by mouth daily before breakfast.  04/15/15  Yes Historical Provider, MD  metoprolol (LOPRESSOR) 50 MG tablet Take 50 mg by mouth every morning.   Yes Historical Provider, MD  midodrine (PROAMATINE) 10 MG tablet Take 1 tablet (10 mg total) by mouth every Monday, Wednesday, and Friday with hemodialysis. 06/18/15  Yes Rolly Salter, MD  pantoprazole (PROTONIX) 40 MG tablet Take 1 tablet (40 mg total) by mouth 2 (two) times daily before a meal. 06/18/15  Yes Rolly Salter, MD    Physical Exam: Filed Vitals:   08/14/15 2200 08/14/15 2215 08/14/15 2230 08/14/15 2245  BP: 91/48 90/53 89/56  87/54  Pulse: 79 79 78 77  Temp:      TempSrc:      Resp:      SpO2: 100% 100% 100% 100%      Constitutional: Elderly female who appears chronically  Filed Vitals:   08/14/15 2200 08/14/15 2215 08/14/15 2230  08/14/15 2245  BP: 91/48 90/53 89/56  87/54  Pulse: 79 79 78 77  Temp:      TempSrc:      Resp:      SpO2: 100% 100% 100% 100%   Eyes: PERRL, lids and conjunctivae normal ENMT: Mucous membranes are moist. Posterior pharynx clear of any exudate or lesions.Normal dentition.  Neck: normal, supple, no masses, no thyromegaly. IV access noted of the right external jugular vein  Respiratory: clear to auscultation bilaterally, no wheezing, no crackles. Normal respiratory effort. No accessory muscle use. Vascular Access of the right chest  Cardiovascular: Regular rate and rhythm, no murmurs / rubs / gallops. No extremity edema. 2+ pedal pulses. No carotid bruits.  Abdomen: Mild tenderness to palpation of the  abdomen epigastric. No hepatosplenomegaly. Bowel sounds positive.  Musculoskeletal: no clubbing / cyanosis. No joint deformity upper and lower extremities. Good ROM, no contractures. Normal muscle tone.  Skin: no rashes, lesions, ulcers. No induration Neurologic: CN 2-12 grossly intact. Sensation intact, DTR normal. Strength 5/5 in all 4.  Psychiatric: Normal judgment and insight. Alert and oriented x 3. Normal mood.    Labs on Admission: I have personally reviewed following labs and imaging studies  CBC:  Recent Labs Lab 08/14/15 1805  WBC 3.7*  NEUTROABS 1.5*  HGB 11.4*  HCT 34.9*  MCV 99.1  PLT 71*   Basic Metabolic Panel:  Recent Labs Lab 08/14/15 1805  NA 127*  K 3.9  CL 90*  CO2 20*  GLUCOSE 98  BUN 25*  CREATININE 6.47*  CALCIUM 9.9   GFR: CrCl cannot be calculated (Unknown ideal weight.). Liver Function Tests:  Recent Labs Lab 08/14/15 1805  AST 74*  ALT 32  ALKPHOS 87  BILITOT 1.4*  PROT 7.6  ALBUMIN 3.0*    Recent Labs Lab 08/14/15 1805  LIPASE 141*   No results for input(s): AMMONIA in the last 168 hours. Coagulation Profile: No results for input(s): INR, PROTIME in the last 168 hours. Cardiac Enzymes:  Recent Labs Lab 08/14/15 1805   TROPONINI 0.21*   BNP (last 3 results) No results for input(s): PROBNP in the last 8760 hours. HbA1C: No results for input(s): HGBA1C in the last 72 hours. CBG:  Recent Labs Lab 08/14/15 1942  GLUCAP 80   Lipid Profile: No results for input(s): CHOL, HDL, LDLCALC, TRIG, CHOLHDL, LDLDIRECT in the last 72 hours. Thyroid Function Tests: No results for input(s): TSH, T4TOTAL, FREET4, T3FREE, THYROIDAB in the last 72 hours. Anemia Panel: No results for input(s): VITAMINB12, FOLATE, FERRITIN, TIBC, IRON, RETICCTPCT in the last 72 hours. Urine analysis:    Component Value Date/Time   COLORURINE AMBER* 08/03/2014 1249   APPEARANCEUR CLOUDY* 08/03/2014 1249   LABSPEC 1.020 08/03/2014 1249   PHURINE 7.0 08/03/2014 1249   GLUCOSEU 500* 08/03/2014 1249   HGBUR LARGE* 08/03/2014 1249   BILIRUBINUR MODERATE* 08/03/2014 1249   KETONESUR 15* 08/03/2014 1249   PROTEINUR >300* 08/03/2014 1249   UROBILINOGEN 4.0* 08/03/2014 1249   NITRITE POSITIVE* 08/03/2014 1249   LEUKOCYTESUR TRACE* 08/03/2014 1249   Sepsis Labs: (procalcitonin:4,lacticidven:4) )No results found for this or any previous visit (from the past 240 hour(s)).   Radiological Exams on Admission: Dg Chest 2 View  08/14/2015  CLINICAL DATA:  Hypotensive.  Diaphysis yesterday EXAM: CHEST  2 VIEW COMPARISON:  06/14/2015 FINDINGS: Right chest wall dialysis catheter is noted with tips in the right atrium. Mild cardiac enlargement. There is mild pulmonary edema. No pleural fluid identified. No airspace consolidation. Previous bilateral shoulder arthroplasty. IMPRESSION: Mild cardiac enlargement and mild interstitial edema. Electronically Signed   By: Signa Kell M.D.   On: 08/14/2015 19:02   Ct Head Wo Contrast  08/14/2015  CLINICAL DATA:  Headache, dizziness. EXAM: CT HEAD WITHOUT CONTRAST TECHNIQUE: Contiguous axial images were obtained from the base of the skull through the vertex without intravenous contrast.  COMPARISON:  CT scan of June 14, 2015. FINDINGS: Bony calvarium appears intact. Mild diffuse cortical atrophy is noted. Mild chronic ischemic white matter disease is noted. No mass effect or midline shift is noted. Ventricular size is within normal limits. There is no evidence of mass lesion, hemorrhage or acute infarction. IMPRESSION: Mild diffuse cortical atrophy. Mild chronic ischemic white matter disease. No acute intracranial abnormality  seen. Electronically Signed   By: Lupita RaiderJames  Green Jr, M.D.   On: 08/14/2015 20:51    EKG: Independently reviewed. Sinus rhythm with Low voltage in the extremity.  Assessment/Plan Hypotension: Patient presented with complaints of dizziness. Blood pressures as low as 62/43 on admission. With mild improvement with 1 L of IV fluids due to renal status. Question cardiac cause to the patient's hypotension vs. if patient is still taking metoprolol vs. underlying infection. - Admit to stepdown - Check orthostatics - Check lactic acid - Blood cultures - IV fluids to 75 mL per hour given end-stage renal disease - Consider changing to midorine 10 mg daily - Placed on empiric antibiotics of vancomycin and Zosyn until can rule out infection  Elevated troponin: Acute. Troponin is elevated on admission at 0.21 patient never previously had elevated troponins. EKG showing some ST wave changes that appear to be new. - Trend troponins  - Consulted cardiology already and will see - Check echocardiogram   Abdominal pain: acute and chronic .On physical exam patient found to have some mild epigastric tenderness  -Check lipase   Vaginal bleeding: Nursing staff confirm that patient did have vaginal spotting - Will need further investigation  ESRD on HD(MWF): Creatinine 5.17, BUN 13, bicarbonate 23, potassium normal. - Left message to renal for dialysis - Strict ins and outs  Cirrhosis: Patient with just a mildly elevated ammonia level 54 - Continue lactulose    Diastolic CHF: last EF 65-75% with grade 2 diastolic dysfunction  - Follow-up echo  Anemia of chronic disease: Hemoglobin of 11.4 on admission - Continue to monitor   Thrombocytopenia (HCC): Likely due to cirrhosis. Platelet 89 on admission, does not need transfusion. -Follow-up by CBC  Neutropenia: Baseline WBC 2 to 3. Etiology is not clear. - repeat CBC  Hypothyroidism - continue Levothyroxine   DVT prophylaxis: heparin Code Status:DNR Family communication: None Disposition Plan:  Undetermined Consults called:  called to on-call cardiology and left message on nephrology Line Admission status:  Observation   Samantha Braunondell A Mckynzie Liwanag MD [facility] Pager [phone]  If 7PM-7AM, please contact night-coverage [email or web address] Password [email or web address]  08/14/2015, 11:42 PM

## 2015-08-15 ENCOUNTER — Observation Stay (HOSPITAL_COMMUNITY): Payer: Medicare Other

## 2015-08-15 DIAGNOSIS — K7469 Other cirrhosis of liver: Secondary | ICD-10-CM

## 2015-08-15 DIAGNOSIS — I959 Hypotension, unspecified: Secondary | ICD-10-CM | POA: Diagnosis not present

## 2015-08-15 DIAGNOSIS — R7989 Other specified abnormal findings of blood chemistry: Secondary | ICD-10-CM

## 2015-08-15 DIAGNOSIS — N939 Abnormal uterine and vaginal bleeding, unspecified: Secondary | ICD-10-CM | POA: Diagnosis present

## 2015-08-15 DIAGNOSIS — R109 Unspecified abdominal pain: Secondary | ICD-10-CM | POA: Diagnosis not present

## 2015-08-15 DIAGNOSIS — N189 Chronic kidney disease, unspecified: Secondary | ICD-10-CM | POA: Diagnosis not present

## 2015-08-15 HISTORY — DX: Abnormal uterine and vaginal bleeding, unspecified: N93.9

## 2015-08-15 LAB — CBC WITH DIFFERENTIAL/PLATELET
Basophils Absolute: 0 10*3/uL (ref 0.0–0.1)
Basophils Relative: 2 %
Eosinophils Absolute: 0.2 10*3/uL (ref 0.0–0.7)
Eosinophils Relative: 6 %
HCT: 39.1 % (ref 36.0–46.0)
HEMOGLOBIN: 13 g/dL (ref 12.0–15.0)
LYMPHS ABS: 1 10*3/uL (ref 0.7–4.0)
Lymphocytes Relative: 41 %
MCH: 32.7 pg (ref 26.0–34.0)
MCHC: 33.2 g/dL (ref 30.0–36.0)
MCV: 98.2 fL (ref 78.0–100.0)
Monocytes Absolute: 0.1 10*3/uL (ref 0.1–1.0)
Monocytes Relative: 4 %
NEUTROS ABS: 1.2 10*3/uL — AB (ref 1.7–7.7)
NEUTROS PCT: 46 %
PLATELETS: 38 10*3/uL — AB (ref 150–400)
RBC: 3.98 MIL/uL (ref 3.87–5.11)
RDW: 16.4 % — ABNORMAL HIGH (ref 11.5–15.5)
WBC: 2.5 10*3/uL — AB (ref 4.0–10.5)

## 2015-08-15 LAB — RENAL FUNCTION PANEL
Albumin: 3.2 g/dL — ABNORMAL LOW (ref 3.5–5.0)
Anion gap: 17 — ABNORMAL HIGH (ref 5–15)
BUN: 30 mg/dL — ABNORMAL HIGH (ref 6–20)
CHLORIDE: 94 mmol/L — AB (ref 101–111)
CO2: 20 mmol/L — ABNORMAL LOW (ref 22–32)
Calcium: 10.1 mg/dL (ref 8.9–10.3)
Creatinine, Ser: 7.03 mg/dL — ABNORMAL HIGH (ref 0.44–1.00)
GFR, EST AFRICAN AMERICAN: 6 mL/min — AB (ref >60)
GFR, EST NON AFRICAN AMERICAN: 5 mL/min — AB (ref >60)
Glucose, Bld: 77 mg/dL (ref 65–99)
POTASSIUM: 3 mmol/L — AB (ref 3.5–5.1)
Phosphorus: 6.6 mg/dL — ABNORMAL HIGH (ref 2.5–4.6)
Sodium: 131 mmol/L — ABNORMAL LOW (ref 135–145)

## 2015-08-15 LAB — TROPONIN I
TROPONIN I: 0.15 ng/mL — AB (ref <0.031)
Troponin I: 0.18 ng/mL — ABNORMAL HIGH (ref <0.031)
Troponin I: 0.18 ng/mL — ABNORMAL HIGH (ref <0.031)

## 2015-08-15 LAB — LACTIC ACID, PLASMA
LACTIC ACID, VENOUS: 1.7 mmol/L (ref 0.5–2.0)
Lactic Acid, Venous: 2.1 mmol/L (ref 0.5–2.0)

## 2015-08-15 LAB — AMMONIA: AMMONIA: 54 umol/L — AB (ref 9–35)

## 2015-08-15 LAB — MRSA PCR SCREENING: MRSA BY PCR: NEGATIVE

## 2015-08-15 MED ORDER — VANCOMYCIN HCL IN DEXTROSE 1-5 GM/200ML-% IV SOLN
1000.0000 mg | INTRAVENOUS | Status: AC
  Administered 2015-08-15: 1000 mg via INTRAVENOUS
  Filled 2015-08-15: qty 200

## 2015-08-15 MED ORDER — VANCOMYCIN HCL IN DEXTROSE 750-5 MG/150ML-% IV SOLN
750.0000 mg | INTRAVENOUS | Status: DC
Start: 2015-08-16 — End: 2015-08-15

## 2015-08-15 MED ORDER — PIPERACILLIN-TAZOBACTAM IN DEX 2-0.25 GM/50ML IV SOLN
2.2500 g | Freq: Three times a day (TID) | INTRAVENOUS | Status: DC
  Filled 2015-08-15 (×2): qty 50

## 2015-08-15 MED ORDER — PIPERACILLIN-TAZOBACTAM 3.375 G IVPB 30 MIN
2.2500 g | INTRAVENOUS | Status: AC
  Administered 2015-08-15: 2.25 g via INTRAVENOUS
  Filled 2015-08-15: qty 50

## 2015-08-15 MED ORDER — POTASSIUM CHLORIDE CRYS ER 10 MEQ PO TBCR
10.0000 meq | EXTENDED_RELEASE_TABLET | Freq: Once | ORAL | Status: AC
  Administered 2015-08-15: 10 meq via ORAL

## 2015-08-15 MED ORDER — POTASSIUM CHLORIDE 20 MEQ PO PACK
10.0000 meq | PACK | Freq: Once | ORAL | Status: DC
  Filled 2015-08-15: qty 1

## 2015-08-15 MED ORDER — MIDODRINE HCL 5 MG PO TABS
10.0000 mg | ORAL_TABLET | Freq: Every day | ORAL | Status: DC
  Administered 2015-08-15 – 2015-08-16 (×2): 10 mg via ORAL
  Filled 2015-08-15 (×2): qty 2

## 2015-08-15 MED ORDER — SODIUM CHLORIDE 0.9 % IV SOLN
INTRAVENOUS | Status: AC
  Administered 2015-08-15: 02:00:00 via INTRAVENOUS

## 2015-08-15 NOTE — Progress Notes (Addendum)
Cardiologist: Dr. Katrinka Blazing Subjective:  A chest pain, no shortness of breath. Hypotension, end-stage renal disease  Objective:  Vital Signs in the last 24 hours: Temp:  [96.5 F (35.8 C)-97.8 F (36.6 C)] 97.3 F (36.3 C) (04/30 0800) Pulse Rate:  [67-90] 70 (04/30 0800) Resp:  [3-20] 11 (04/30 0800) BP: (62-113)/(29-81) 105/61 mmHg (04/30 0800) SpO2:  [98 %-100 %] 100 % (04/30 0800) Weight:  [157 lb 3.2 oz (71.305 kg)-173 lb 1 oz (78.5 kg)] 157 lb 3.2 oz (71.305 kg) (04/30 0500)  Intake/Output from previous day: 04/29 0701 - 04/30 0700 In: 552.5 [I.V.:552.5] Out: -    Physical Exam: General: Elderly, ill appearing, in no acute distress. Sleepy Head:  Normocephalic and atraumatic. Lungs: Clear to auscultation and percussion. Heart: Normal S1 and S2.  No murmur, rubs or gallops.  Abdomen: soft, non-tender, positive bowel sounds. Extremities: No clubbing or cyanosis. No edema.fistula left arm Neurologic: Sleeping    Lab Results:  Recent Labs  08/14/15 1805 08/15/15 0541  WBC 3.7* 2.5*  HGB 11.4* 13.0  PLT 71* 38*    Recent Labs  08/14/15 1805 08/15/15 0540  NA 127* 131*  K 3.9 3.0*  CL 90* 94*  CO2 20* 20*  GLUCOSE 98 77  BUN 25* 30*  CREATININE 6.47* 7.03*    Recent Labs  08/14/15 1805  TROPONINI 0.21*   Hepatic Function Panel  Recent Labs  08/14/15 1805 08/15/15 0540  PROT 7.6  --   ALBUMIN 3.0* 3.2*  AST 74*  --   ALT 32  --   ALKPHOS 87  --   BILITOT 1.4*  --    No results for input(s): CHOL in the last 72 hours. No results for input(s): PROTIME in the last 72 hours.  Imaging: Dg Chest 2 View  08/14/2015  CLINICAL DATA:  Hypotensive.  Diaphysis yesterday EXAM: CHEST  2 VIEW COMPARISON:  06/14/2015 FINDINGS: Right chest wall dialysis catheter is noted with tips in the right atrium. Mild cardiac enlargement. There is mild pulmonary edema. No pleural fluid identified. No airspace consolidation. Previous bilateral shoulder arthroplasty.  IMPRESSION: Mild cardiac enlargement and mild interstitial edema. Electronically Signed   By: Signa Kell M.D.   On: 08/14/2015 19:02   Ct Head Wo Contrast  08/14/2015  CLINICAL DATA:  Headache, dizziness. EXAM: CT HEAD WITHOUT CONTRAST TECHNIQUE: Contiguous axial images were obtained from the base of the skull through the vertex without intravenous contrast. COMPARISON:  CT scan of June 14, 2015. FINDINGS: Bony calvarium appears intact. Mild diffuse cortical atrophy is noted. Mild chronic ischemic white matter disease is noted. No mass effect or midline shift is noted. Ventricular size is within normal limits. There is no evidence of mass lesion, hemorrhage or acute infarction. IMPRESSION: Mild diffuse cortical atrophy. Mild chronic ischemic white matter disease. No acute intracranial abnormality seen. Electronically Signed   By: Lupita Raider, M.D.   On: 08/14/2015 20:51   Personally viewed.   Telemetry: no adverse arrhythmias Personally viewed.   EKG:  NSR, low voltage, nonspecific T wave change -- -- overall not much changed when compared to 06/14/15 Personally viewed.  Cardiac Studies:  ECHO 12/27/14: - Left ventricle: The cavity size was normal. Systolic function was  vigorous. The estimated ejection fraction was in the range of 65%  to 70%. Wall motion was normal; there were no regional wall  motion abnormalities. Features are consistent with a pseudonormal  left ventricular filling pattern, with concomitant abnormal  relaxation and increased  filling pressure (grade 2 diastolic  dysfunction). - Mitral valve: Moderately calcified annulus. There was mild to  moderate regurgitation. - Left atrium: The atrium was moderately dilated. - Right atrium: The atrium was mildly dilated. - Pulmonary arteries: Systolic pressure was mildly increased. PA  peak pressure: 35 mm Hg (S).  Impressions:  - There was no evidence of a vegetation.  Meds: Scheduled Meds: . heparin   5,000 Units Subcutaneous Q8H  . lactulose  30 g Oral BID  . levothyroxine  50 mcg Oral QAC breakfast  . midodrine  10 mg Oral Daily  . pantoprazole  40 mg Oral BID AC  . piperacillin-tazobactam  2.25 g Intravenous STAT  . piperacillin-tazobactam (ZOSYN)  IV  2.25 g Intravenous Q8H  . vancomycin  1,000 mg Intravenous STAT  . [START ON 08/16/2015] vancomycin  750 mg Intravenous Q M,W,F-HD   Continuous Infusions:  PRN Meds:.acetaminophen, acetaminophen, gi cocktail, HYDROcodone-acetaminophen, morphine injection, ondansetron (ZOFRAN) IV  Assessment/Plan:  Principal Problem:   Hypotension Active Problems:   Anemia in chronic kidney disease (CKD)   Abdominal pain   Hyponatremia   Thrombocytopenia (HCC)   Cirrhosis of liver (HCC)   ESRD on dialysis (HCC)   Vaginal bleeding  Demand ischemia/elevated troponin - Likely secondary to hypotension, no new EKG changes, no chest pain, no shortness of breath. - would not recommend any further cardiac testing at this time other than echocardiogram.  Hypotension -May not be able to tolerate beta blocker.  Hypokalemia -Replete per primary team  We will sign off. Please call if any questions.  Samantha [name] 08/15/2015, 9:59 AM

## 2015-08-15 NOTE — Progress Notes (Signed)
Lab was unable to find a vein for labs this morning.  They will send a second person to attempt a blood draw.  If they are unsuccessful, we may need a PICC line or a foot stick order to obtain lab work ordered outside of hemodialysis.

## 2015-08-15 NOTE — Progress Notes (Signed)
Patient transferred to [facility], report given to receiving nurse.

## 2015-08-15 NOTE — Progress Notes (Addendum)
PROGRESS NOTE                                                                                                                                                                                                             Patient Demographics:    Samantha [name], is a 70 y.o. female, DOB - 12-21-1945, QQV:[id number]  Admit date - 08/14/2015   Admitting Physician Clydie Braun, MD  Outpatient Primary MD for the patient is Dorrene German, MD  LOS -   Outpatient Specialists:   Chief Complaint  Patient presents with  . Hypotension  . Shortness of Breath       Brief Narrative  70 y.o. female with medical history significant of ESRD on HD(M/W/F), hypothyroidism, hypotension, cirrhosis, osteoarthritis, HTN, anemia, dizziness; who presents with complaints of abdominal pain and dizziness, Workup was significant for elevated troponin and hypotension, blood pressure in ED 62/43, retention resolved with fluid bolus, seen by cardiology for troponin, thought to be secondary to demand ischemia from hypotension, there are reports of vaginal spotting,   Subjetive:    [name] today has, No headache, No chest pain, NoFurther abdominal pain   Assessment  & Plan :    Principal Problem:   Hypotension Active Problems:   Anemia in chronic kidney disease (CKD)   Abdominal pain   Hyponatremia   Thrombocytopenia (HCC)   Cirrhosis of liver (HCC)   ESRD on dialysis (HCC)   Vaginal bleeding    hypotension with dizziness  - Continue low blood pressure in ED 62/43 , resolved with IV fluid bolus  - Patient does not appear to be septic , lactic acid 1.7 on admission, afebrile, no leukocytosis , will discontinue IV antibiotics - Follow on blood cultures  - Patient with chronic hypotension at baseline   Elevated troponin  - Cardiology consult appreciated, and the setting of demand ischemia, follow 2-D echo, otherwise no further workup recommended . Abdominal  pain  - Patient with known history of chronic abdominal pain, benign physical exam today  - Lipase elevated at 141, but by reviewing previous records it was consistently elevated    vaginal spotting - Discussed with nursing staff, nothing noticed today - We'll check ultrasound pelvis  ESRD - Renal consulted, on hemodialysis Monday Wednesday Friday - Mild hypokalemia, will give only K Dur 10 meq  Cirrhosis: Patient with just a mildly elevated ammonia level 54 - Continue lactulose   Diastolic CHF: last EF 65-75% with grade 2 diastolic dysfunction  - Follow-up echo - Volume management with dialysis  Anemia of chronic disease: Hemoglobin of 11.4 on admission - Continue to monitor   Thrombocytopenia (HCC): -  Likely due to cirrhosis. Platelet 71 on admission, PT is 38 , no need for transfusion, will use SCD for DVT prophylaxis   Hypothyroidism - continue Levothyroxine  Code Status : DNR  Family Communication  : none at bedside  Disposition Plan  :   Barriers For Discharge :   Consults  :  Cardiology/renal  Procedures  :   DVT Prophylaxis  :  SCD given significant thrombocytopenia  Lab Results  Component Value Date   PLT 38* 08/15/2015    Antibiotics  :   Anti-infectives    Start     Dose/Rate Route Frequency Ordered Stop   08/16/15 1200  vancomycin (VANCOCIN) IVPB 750 mg/150 ml premix  Status:  Discontinued     750 mg 150 mL/hr over 60 Minutes Intravenous Every M-W-F (Hemodialysis) 08/15/15 0747 08/15/15 1127   08/15/15 1600  piperacillin-tazobactam (ZOSYN) IVPB 2.25 g  Status:  Discontinued     2.25 g 100 mL/hr over 30 Minutes Intravenous Every 8 hours 08/15/15 0747 08/15/15 1127   08/15/15 0745  vancomycin (VANCOCIN) IVPB 1000 mg/200 mL premix     1,000 mg 200 mL/hr over 60 Minutes Intravenous STAT 08/15/15 0738 08/16/15 0745   08/15/15 0745  piperacillin-tazobactam (ZOSYN) IVPB 2.25 g     2.25 g 66.7 mL/hr over 30 Minutes Intravenous STAT 08/15/15 0738  08/16/15 0745        Objective:   Filed Vitals:   08/15/15 0530 08/15/15 0600 08/15/15 0700 08/15/15 0800  BP: 105/46 113/43 113/81 105/61  Pulse:  68 70 70  Temp:    97.3 F (36.3 C)  TempSrc:    Oral  Resp: Height:      Weight:      SpO2:  99% 99% 100%    Wt Readings from Last 3 Encounters:  08/15/15 71.305 kg (157 lb 3.2 oz)  06/29/15 82.101 kg (181 lb)  06/22/15 79.561 kg (175 lb 6.4 oz)     Intake/Output Summary (Last 24 hours) at 08/15/15 1128 Last data filed at 08/15/15 0900  Gross per 24 hour  Intake  632.5 ml  Output      0 ml  Net  632.5 ml     Physical Exam  Awake Alert,  [place].AT,PERRAL Supple Neck,No JVD, N Symmetrical Chest wall movement, Good air movement bilaterally,  RRR,No Gallops,Rubs or new Murmurs, No Parasternal Heave, right permacath +ve B.Sounds, Abd Soft, No tenderness, No organomegaly appriciated, No rebound - guarding or rigidity. No Cyanosis, Clubbing or edema, No new Rash or bruise     Data Review:    CBC  Recent Labs Lab 08/14/15 1805 08/15/15 0541  WBC 3.7* 2.5*  HGB 11.4* 13.0  HCT 34.9* 39.1  PLT 71* 38*  MCV 99.1 98.2  MCH 32.4 32.7  MCHC 32.7 33.2  RDW 16.5* 16.4*  LYMPHSABS 1.5 1.0  MONOABS 0.5 0.1  EOSABS 0.2 0.2  BASOSABS 0.1 0.0    Chemistries   Recent Labs Lab 08/14/15 1805 08/15/15 0540  NA 127* 131*  K 3.9 3.0*  CL 90* 94*  CO2 20* 20*  GLUCOSE 98 77  BUN 25* 30*  CREATININE 6.47* 7.03*  CALCIUM 9.9 10.1  AST 74*  --   ALT 32  --   ALKPHOS 87  --   BILITOT 1.4*  --    ------------------------------------------------------------------------------------------------------------------ No results for input(s): CHOL, HDL, LDLCALC, TRIG, CHOLHDL, LDLDIRECT in the last 72 hours.  No results found for: HGBA1C ------------------------------------------------------------------------------------------------------------------ No results for input(s): TSH, T4TOTAL, T3FREE, THYROIDAB in  the last 72 hours.  Invalid input(s): FREET3 ------------------------------------------------------------------------------------------------------------------ No results for input(s): VITAMINB12, FOLATE, FERRITIN, TIBC, IRON, RETICCTPCT in the last 72 hours.  Coagulation profile No results for input(s): INR, PROTIME in the last 168 hours.  No results for input(s): DDIMER in the last 72 hours.  Cardiac Enzymes  Recent Labs Lab 08/14/15 1805  TROPONINI 0.21*   ------------------------------------------------------------------------------------------------------------------ No results found for: BNP  Inpatient Medications  Scheduled Meds: . heparin  5,000 Units Subcutaneous Q8H  . lactulose  30 g Oral BID  . levothyroxine  50 mcg Oral QAC breakfast  . midodrine  10 mg Oral Daily  . pantoprazole  40 mg Oral BID AC  . piperacillin-tazobactam  2.25 g Intravenous STAT  . potassium chloride  10 mEq Oral Once  . vancomycin  1,000 mg Intravenous STAT   Continuous Infusions:  PRN Meds:.acetaminophen, acetaminophen, gi cocktail, HYDROcodone-acetaminophen, morphine injection, ondansetron (ZOFRAN) IV  Micro Results Recent Results (from the past 240 hour(s))  MRSA PCR Screening     Status: None   Collection Time: 08/15/15  2:12 AM  Result Value Ref Range Status   MRSA by PCR NEGATIVE NEGATIVE Final    Comment:        The GeneXpert MRSA Assay (FDA approved for NASAL specimens only), is one component of a comprehensive MRSA colonization surveillance program. It is not intended to diagnose MRSA infection nor to guide or monitor treatment for MRSA infections.     Radiology Reports Dg Chest 2 View  08/14/2015  CLINICAL DATA:  Hypotensive.  Diaphysis yesterday EXAM: CHEST  2 VIEW COMPARISON:  06/14/2015 FINDINGS: Right chest wall dialysis catheter is noted with tips in the right atrium. Mild cardiac enlargement. There is mild pulmonary edema. No pleural fluid identified. No  airspace consolidation. Previous bilateral shoulder arthroplasty. IMPRESSION: Mild cardiac enlargement and mild interstitial edema. Electronically Signed   By: Signa Kellaylor  Stroud M.D.   On: 08/14/2015 19:02   Ct Head Wo Contrast  08/14/2015  CLINICAL DATA:  Headache, dizziness. EXAM: CT HEAD WITHOUT CONTRAST TECHNIQUE: Contiguous axial images were obtained from the base of the skull through the vertex without intravenous contrast. COMPARISON:  CT scan of June 14, 2015. FINDINGS: Bony calvarium appears intact. Mild diffuse cortical atrophy is noted. Mild chronic ischemic white matter disease is noted. No mass effect or midline shift is noted. Ventricular size is within normal limits. There is no evidence of mass lesion, hemorrhage or acute infarction. IMPRESSION: Mild diffuse cortical atrophy. Mild chronic ischemic white matter disease. No acute intracranial abnormality seen. Electronically Signed   By: Lupita RaiderJames  Green Jr, M.D.   On: 08/14/2015 20:51    Time Spent in minutes  30 Minutes   Satin Boal M.D on 08/15/2015 at 11:28 AM  Between 7am to 7pm - Pager - [phone]  After 7pm go to [email or web address] - password [email or web address]  [facility] -  Office  [phone]

## 2015-08-15 NOTE — Progress Notes (Signed)
Attempted report 

## 2015-08-15 NOTE — Consult Note (Signed)
Referring Physician: Dr. Katrinka BlazingSmith -- Hospitalist   Reason for Consultation: Abnormal TnI  HPI: 70 yo AA woman with ESRD, on HD MWF via left upper chest perm cath, also has AVF left arm, cirrhosis, hyoptension, anemia, hyponatremia and chronic abd pain, presented to ER with c/o abdominal pain and dizziness. Per EMS Bp was 77/46.  Patient unable to provide any useful medical information and therefore information obtained from ER provider and Dr. Katrinka BlazingSmith. At present she denies CP or SOB. Does not appear to be in any distress.   In ER, her TnI 0.21, Cr 6.4, K 3.9.  ECG -- NSR, low voltage, nonspecific T wave abnormality  On the floor SBP in 80's and HR in 70-80's.    Review of Systems:  10 systems reviewed unremarkable except as noted in HPI   Past Medical History  Diagnosis Date  . Shoulder pain   . Headache(784.0)     occasionally  . Dizziness   . Occasional numbness/prickling/tingling of fingers and toes   . Arthritis     left shoulder  . Joint pain   . Joint swelling   . Gastric ulcer   . Dry skin   . Peripheral edema   . Constipation   . Oligouria   . H/O: GI bleed   . Hepatitis     Hep B  . History of kidney stones   . History of blood transfusion   . Hypertension   . Anemia   . Hypothyroidism     takes Synthroid daily  . Impaired hearing   . Diabetes mellitus     borderline  . Renal disorder     m, w, F     Medications Prior to Admission  Medication Sig Dispense Refill  . acetaminophen (TYLENOL) 500 MG tablet Take 500 mg by mouth every 6 (six) hours as needed for moderate pain.    Marland Kitchen. HYDROcodone-acetaminophen (NORCO/VICODIN) 5-325 MG tablet Take 1 tablet by mouth every 6 (six) hours as needed for moderate pain.    Marland Kitchen. lactulose (CHRONULAC) 10 GM/15ML solution Take 45 mLs (30 g total) by mouth 2 (two) times daily. 240 mL 0  . levothyroxine (SYNTHROID, LEVOTHROID) 50 MCG tablet Take 50 mcg by mouth daily before breakfast.     . metoprolol (LOPRESSOR) 50 MG tablet  Take 50 mg by mouth every morning.    . midodrine (PROAMATINE) 10 MG tablet Take 1 tablet (10 mg total) by mouth every Monday, Wednesday, and Friday with hemodialysis. 30 tablet 0  . pantoprazole (PROTONIX) 40 MG tablet Take 1 tablet (40 mg total) by mouth 2 (two) times daily before a meal. 30 tablet 0     . heparin  5,000 Units Subcutaneous Q8H  . lactulose  30 g Oral BID  . levothyroxine  50 mcg Oral QAC breakfast  . [START ON 08/16/2015] midodrine  10 mg Oral Q M,W,F-HD  . pantoprazole  40 mg Oral BID AC    Infusions: . sodium chloride      Allergies  Allergen Reactions  . Banana Other (See Comments)    Due to dialysis  . Chocolate Other (See Comments)    Due to dialysis  . Fish-Derived Products Other (See Comments)    Due to gout    Social History   Social History  . Marital Status: Widowed    Spouse Name: N/A  . Number of Children: N/A  . Years of Education: N/A   Occupational History  . Not on file.  Social History Main Topics  . Smoking status: Never Smoker   . Smokeless tobacco: Current User    Types: Chew  . Alcohol Use: No     Comment: quit 4-43yrs ago  . Drug Use: No  . Sexual Activity: No   Other Topics Concern  . Not on file   Social History Narrative    Family History  Problem Relation Age of Onset  . Hypertension Mother   . Diabetes Mother   . Cancer Brother     PHYSICAL EXAM: Filed Vitals:   08/14/15 2230 08/14/15 2245  BP: 89/56 87/54  Pulse: 78 77  Temp:    Resp:       Intake/Output Summary (Last 24 hours) at 08/15/15 0043 Last data filed at 08/14/15 1932  Gross per 24 hour  Intake    500 ml  Output      0 ml  Net    500 ml    General:  Chronically ill, confused, dementia?, No respiratory difficulty HEENT: AT, [place], EOMI, edentulous Neck: supple. no JVD. Carotids 2+ bilat; no bruits. No lymphadenopathy or thryomegaly appreciated. Cor: PMI nondisplaced. Regular rate & rhythm. No rubs, gallops or murmurs. Lungs:  clear Abdomen: soft, nontender, nondistended. No hepatosplenomegaly. No bruits or masses. Good bowel sounds. Extremities: left AVF, no clubbing, rash, edema Neuro: alert, oriented to her name, follows simple commands    ECG: NSR, low voltage, nonspecific T wave change -- -- overall not much changed when compared to 06/14/15  Results for orders placed or performed during the hospital encounter of 08/14/15 (from the past 24 hour(s))  CBC with Differential     Status: Abnormal   Collection Time: 08/14/15  6:05 PM  Result Value Ref Range   WBC 3.7 (L) 4.0 - 10.5 K/uL   RBC 3.52 (L) 3.87 - 5.11 MIL/uL   Hemoglobin 11.4 (L) 12.0 - 15.0 g/dL   HCT 82.9 (L) 56.2 - 13.0 %   MCV 99.1 78.0 - 100.0 fL   MCH 32.4 26.0 - 34.0 pg   MCHC 32.7 30.0 - 36.0 g/dL   RDW 86.5 (H) 78.4 - 69.6 %   Platelets 71 (L) 150 - 400 K/uL   Neutrophils Relative % 41 %   Neutro Abs 1.5 (L) 1.7 - 7.7 K/uL   Lymphocytes Relative 40 %   Lymphs Abs 1.5 0.7 - 4.0 K/uL   Monocytes Relative 13 %   Monocytes Absolute 0.5 0.1 - 1.0 K/uL   Eosinophils Relative 5 %   Eosinophils Absolute 0.2 0.0 - 0.7 K/uL   Basophils Relative 1 %   Basophils Absolute 0.1 0.0 - 0.1 K/uL  Comprehensive metabolic panel     Status: Abnormal   Collection Time: 08/14/15  6:05 PM  Result Value Ref Range   Sodium 127 (L) 135 - 145 mmol/L   Potassium 3.9 3.5 - 5.1 mmol/L   Chloride 90 (L) 101 - 111 mmol/L   CO2 20 (L) 22 - 32 mmol/L   Glucose, Bld 98 65 - 99 mg/dL   BUN 25 (H) 6 - 20 mg/dL   Creatinine, Ser 2.95 (H) 0.44 - 1.00 mg/dL   Calcium 9.9 8.9 - 28.4 mg/dL   Total Protein 7.6 6.5 - 8.1 g/dL   Albumin 3.0 (L) 3.5 - 5.0 g/dL   AST 74 (H) 15 - 41 U/L   ALT 32 14 - 54 U/L   Alkaline Phosphatase 87 38 - 126 U/L   Total Bilirubin 1.4 (H) 0.3 - 1.2  mg/dL   GFR calc non Af Amer 6 (L) >60 mL/min   GFR calc Af Amer 7 (L) >60 mL/min   Anion gap 17 (H) 5 - 15  Lipase, blood     Status: Abnormal   Collection Time: 08/14/15  6:05 PM  Result  Value Ref Range   Lipase 141 (H) 11 - 51 U/L  Troponin I     Status: Abnormal   Collection Time: 08/14/15  6:05 PM  Result Value Ref Range   Troponin I 0.21 (H) <0.031 ng/mL  CBG monitoring, ED     Status: None   Collection Time: 08/14/15  7:42 PM  Result Value Ref Range   Glucose-Capillary 80 65 - 99 mg/dL  POC occult blood, ED RN will collect     Status: None   Collection Time: 08/14/15  8:03 PM  Result Value Ref Range   Fecal Occult Bld NEGATIVE NEGATIVE   Dg Chest 2 View  08/14/2015  CLINICAL DATA:  Hypotensive.  Diaphysis yesterday EXAM: CHEST  2 VIEW COMPARISON:  06/14/2015 FINDINGS: Right chest wall dialysis catheter is noted with tips in the right atrium. Mild cardiac enlargement. There is mild pulmonary edema. No pleural fluid identified. No airspace consolidation. Previous bilateral shoulder arthroplasty. IMPRESSION: Mild cardiac enlargement and mild interstitial edema. Electronically Signed   By: Signa Kell M.D.   On: 08/14/2015 19:02   Ct Head Wo Contrast  08/14/2015  CLINICAL DATA:  Headache, dizziness. EXAM: CT HEAD WITHOUT CONTRAST TECHNIQUE: Contiguous axial images were obtained from the base of the skull through the vertex without intravenous contrast. COMPARISON:  CT scan of June 14, 2015. FINDINGS: Bony calvarium appears intact. Mild diffuse cortical atrophy is noted. Mild chronic ischemic white matter disease is noted. No mass effect or midline shift is noted. Ventricular size is within normal limits. There is no evidence of mass lesion, hemorrhage or acute infarction. IMPRESSION: Mild diffuse cortical atrophy. Mild chronic ischemic white matter disease. No acute intracranial abnormality seen. Electronically Signed   By: Lupita Raider, M.D.   On: 08/14/2015 20:51     ASSESSMENT:  1. Mildly abnormal TnI (0.21) in the setting of symptomatic hypotension - Looks like Midodrine was added upon her admission earlier last month to treat hypotension  - No CP or  ischemic changes on ECG  PLAN/DISCUSSION:  Agree with echo and trending TnI. Don't think ACS.  Low dose ASA po qd if no bleeding issues May not be able to give beta blocker due to her chronic hypotension   Thanks for the consult. Cardiology will follow.   Nevin Bloodgood, MD Cardiology

## 2015-08-15 NOTE — Progress Notes (Signed)
Received to 2H21.  Pain free at present time and without complaints.  Oriented to room, routine and staff.  BP 80/63 in right arm.  Left arm unusable due to hemodialysis graft.  BP retaken in right calf 101/41.  Further assessment documented in flow sheet.  Will continue to monitor and report hypotension to MD on call.

## 2015-08-15 NOTE — Progress Notes (Signed)
Pharmacy Antibiotic Note  Samantha [name] is a 70 y.o. female admitted on 08/14/2015 with sepsis.  Pharmacy has been consulted for vancomycin and zosyn dosing.  Plan: Vancomycin 1g IV load *1, then 750mg  every MWF with HD Zosyn 2.25g IV every 8 hours Pre-HD vanc level as indicated (goal 15 - 2925mcg/ml) Follow culture results and adjust antibiotics accordingly Follow HD plans and adjust schedule as needed  Height: 5' (152.4 cm) Weight: 157 lb 3.2 oz (71.305 kg) IBW/kg (Calculated) : 45.5  Temp (24hrs), Avg:97.4 F (36.3 C), Min:96.5 F (35.8 C), Max:97.8 F (36.6 C)   Recent Labs Lab 08/14/15 1805 08/15/15 0210 08/15/15 0540 08/15/15 0541  WBC 3.7*  --   --  2.5*  CREATININE 6.47*  --  7.03*  --   LATICACIDVEN  --  1.7 2.1*  --     Estimated Creatinine Clearance: 6.5 mL/min (by C-G formula based on Cr of 7.03).    Allergies  Allergen Reactions  . Banana Other (See Comments)    Due to dialysis  . Chocolate Other (See Comments)    Due to dialysis  . Fish-Derived Products Other (See Comments)    Due to gout    Antimicrobials this admission: 4/30 Vanc >>  4/30 Zosyn >>   Microbiology results: 4/30 BCx:  4/30 MRSA PCR: negative  Thank you for allowing pharmacy to be a part of this patient's care.   Sherron MondayAubrey N. Montavis [name], PharmD Clinical Pharmacy Resident Pager: [phone] 08/15/2015 7:53 AM

## 2015-08-15 NOTE — Progress Notes (Signed)
Dr Katrinka BlazingSmith notified of stable blood pressure.  Order to hold Midodrine tonight and turn off IV fluids at 0700 given.  Will continue to monitor and report changes in condition.

## 2015-08-15 NOTE — Progress Notes (Signed)
Patient helped to bedside commode by this RN, spotting noticed during pericare, seems to come from vaginal or urethral area. Patient has no c/o pain at this time. Will continue to monitor patient closely.

## 2015-08-16 ENCOUNTER — Ambulatory Visit (HOSPITAL_COMMUNITY): Payer: Medicare Other

## 2015-08-16 DIAGNOSIS — I959 Hypotension, unspecified: Secondary | ICD-10-CM | POA: Diagnosis not present

## 2015-08-16 DIAGNOSIS — K7469 Other cirrhosis of liver: Secondary | ICD-10-CM | POA: Diagnosis not present

## 2015-08-16 DIAGNOSIS — R109 Unspecified abdominal pain: Secondary | ICD-10-CM | POA: Diagnosis not present

## 2015-08-16 DIAGNOSIS — N186 End stage renal disease: Secondary | ICD-10-CM | POA: Diagnosis not present

## 2015-08-16 LAB — CBC
HEMATOCRIT: 30.5 % — AB (ref 36.0–46.0)
Hemoglobin: 10.6 g/dL — ABNORMAL LOW (ref 12.0–15.0)
MCH: 33.3 pg (ref 26.0–34.0)
MCHC: 34.8 g/dL (ref 30.0–36.0)
MCV: 95.9 fL (ref 78.0–100.0)
PLATELETS: 46 10*3/uL — AB (ref 150–400)
RBC: 3.18 MIL/uL — ABNORMAL LOW (ref 3.87–5.11)
RDW: 16 % — AB (ref 11.5–15.5)
WBC: 2.4 10*3/uL — AB (ref 4.0–10.5)

## 2015-08-16 LAB — BASIC METABOLIC PANEL
ANION GAP: 17 — AB (ref 5–15)
BUN: 40 mg/dL — ABNORMAL HIGH (ref 6–20)
CALCIUM: 9.1 mg/dL (ref 8.9–10.3)
CO2: 17 mmol/L — AB (ref 22–32)
CREATININE: 8.74 mg/dL — AB (ref 0.44–1.00)
Chloride: 96 mmol/L — ABNORMAL LOW (ref 101–111)
GFR, EST AFRICAN AMERICAN: 5 mL/min — AB (ref >60)
GFR, EST NON AFRICAN AMERICAN: 4 mL/min — AB (ref >60)
GLUCOSE: 99 mg/dL (ref 65–99)
Potassium: 3.1 mmol/L — ABNORMAL LOW (ref 3.5–5.1)
Sodium: 130 mmol/L — ABNORMAL LOW (ref 135–145)

## 2015-08-16 LAB — CORTISOL-AM, BLOOD: Cortisol - AM: 8.8 ug/dL (ref 6.7–22.6)

## 2015-08-16 MED ORDER — MIDODRINE HCL 5 MG PO TABS
ORAL_TABLET | ORAL | Status: AC
  Filled 2015-08-16: qty 2

## 2015-08-16 MED ORDER — MIDODRINE HCL 5 MG PO TABS: 10.0000 mg | ORAL_TABLET | Freq: Two times a day (BID) | ORAL | Status: DC

## 2015-08-16 MED ORDER — RENA-VITE PO TABS
1.0000 | ORAL_TABLET | Freq: Every day | ORAL | Status: DC
  Administered 2015-08-16 – 2015-08-19 (×4): 1 via ORAL
  Filled 2015-08-16 (×4): qty 1

## 2015-08-16 MED ORDER — SODIUM CHLORIDE 0.9 % IV SOLN: 100.0000 mL | INTRAVENOUS | Status: DC | PRN

## 2015-08-16 MED ORDER — ALTEPLASE 2 MG IJ SOLR
2.0000 mg | Freq: Once | INTRAMUSCULAR | Status: DC | PRN
Start: 2015-08-16 — End: 2015-08-16

## 2015-08-16 MED ORDER — LACTULOSE 10 GM/15ML PO SOLN
30.0000 g | Freq: Three times a day (TID) | ORAL | Status: DC
  Administered 2015-08-16 – 2015-08-19 (×9): 30 g via ORAL
  Filled 2015-08-16 (×11): qty 45

## 2015-08-16 MED ORDER — MIDODRINE HCL 5 MG PO TABS
10.0000 mg | ORAL_TABLET | ORAL | Status: DC
  Administered 2015-08-16 – 2015-08-18 (×6): 10 mg via ORAL
  Filled 2015-08-16 (×5): qty 2

## 2015-08-16 MED ORDER — LIDOCAINE-PRILOCAINE 2.5-2.5 % EX CREA
1.0000 | TOPICAL_CREAM | CUTANEOUS | Status: DC | PRN
Start: 2015-08-16 — End: 2015-08-16

## 2015-08-16 MED ORDER — PENTAFLUOROPROP-TETRAFLUOROETH EX AERO: 1.0000 "application " | INHALATION_SPRAY | CUTANEOUS | Status: DC | PRN

## 2015-08-16 MED ORDER — HEPARIN SODIUM (PORCINE) 1000 UNIT/ML DIALYSIS: 1000.0000 [IU] | INTRAMUSCULAR | Status: DC | PRN

## 2015-08-16 MED ORDER — LIDOCAINE HCL (PF) 1 % IJ SOLN
5.0000 mL | INTRAMUSCULAR | Status: DC | PRN
Start: 2015-08-16 — End: 2015-08-16

## 2015-08-16 MED ORDER — HEPARIN SODIUM (PORCINE) 1000 UNIT/ML DIALYSIS: 2000.0000 [IU] | INTRAMUSCULAR | Status: DC | PRN

## 2015-08-16 MED ORDER — SODIUM CHLORIDE 0.9 % IV BOLUS (SEPSIS)
500.0000 mL | Freq: Once | INTRAVENOUS | Status: AC
  Administered 2015-08-16: 500 mL via INTRAVENOUS

## 2015-08-16 MED ORDER — NEPRO/CARBSTEADY PO LIQD
237.0000 mL | Freq: Two times a day (BID) | ORAL | Status: DC
  Administered 2015-08-17 – 2015-08-19 (×6): 237 mL via ORAL
  Filled 2015-08-16 (×11): qty 237

## 2015-08-16 MED ORDER — SODIUM CHLORIDE 0.9 % IV SOLN
100.0000 mL | INTRAVENOUS | Status: DC | PRN
Start: 2015-08-16 — End: 2015-08-16

## 2015-08-16 NOTE — Progress Notes (Addendum)
PROGRESS NOTE                                                                                                                                                                                                             Patient Demographics:    Samantha [name], is a 70 y.o. female, DOB - [birth year], XBJ:[id number]  Admit date - 08/14/2015   Admitting Physician Clydie Braun, MD  Outpatient Primary MD for the patient is Dorrene German, MD  LOS -   Outpatient Specialists:   Chief Complaint  Patient presents with  . Hypotension  . Shortness of Breath       Brief Narrative  70 y.o. female with medical history significant of ESRD on HD(M/W/F), hypothyroidism, hypotension, cirrhosis, osteoarthritis, HTN, anemia, dizziness; who presents with complaints of abdominal pain and dizziness, Workup was significant for elevated troponin and hypotension, blood pressure in ED 62/43, retention resolved with fluid bolus, seen by cardiology for troponin, thought to be secondary to demand ischemia from hypotension, as well with vaginal spotting, US pelvis with increased endometrial thickness  Subjetive:    [name] today has, No headache, No chest pain, NoFurther abdominal pain   Assessment  & Plan :    Principal Problem:   Hypotension Active Problems:   Anemia in chronic kidney disease (CKD)   Abdominal pain   Hyponatremia   Thrombocytopenia (HCC)   Cirrhosis of liver (HCC)   ESRD on dialysis (HCC)   Vaginal bleeding    hypotension with dizziness  - Continue low blood pressure in ED 62/43 , resolved with IV fluid bolus  - Patient does not appear to be septic , lactic acid 1.7 on admission, afebrile, no leukocytosis , will discontinue IV antibiotics - Follow on blood cultures  - follow 2-D echo - check am cortisol - Patient with chronic hypotension at baseline  -  Maintenance significantly hypotensive, will increase to medical treatment to 10 mg  twice a day  Elevated troponin  - Cardiology consult appreciated, his is most likely in the setting of demand ischemia, follow 2-D echo, otherwise no further workup recommended .  Abdominal pain  - Patient with known history of chronic abdominal pain, benign physical exam abdominal exam since admission, denies any further abdominal pain - Lipase elevated at 141, but by reviewing previous records it was consistently  elevated   Vaginal spotting - ultrasound pelvis with significant endometrial thickening, will need endometrial sampling as an outpatient, discussed with Dr. Adrian BlackwaterStinson, they will arrange for outpatient follow-up, and this was complained to granddaughter who understand the importance of outpatient follow-up, actually she is telling me they are rescheduling her previous appointment  ESRD - Renal consulted, on hemodialysis Monday Wednesday Friday  Cirrhosis - Patient with just a mildly elevated ammonia level 54 - will increase lactulose to 3 times a day given she is  still lethargic  Diastolic CHF - last EF 65-75% with grade 2 diastolic dysfunction  - Follow-up echo - Volume management with dialysis  Anemia of chronic renal  Disease -  Hemoglobin of 11.4 on admission - Continue to monitor   Thrombocytopenia (HCC): -  Likely due to cirrhosis. Platelet 71 on admission,  no need for transfusion, will use SCD for DVT prophylaxis   Hypothyroidism - continue Levothyroxine  Code Status : Full code , confirmed by grandaughter  Family Communication  : Spoke with granddaughter Ms Margit HanksWahington via phone  Disposition Plan  : pending PT  Barriers For Discharge : Hypotension  Consults  :  Cardiology/renal  Procedures  :   DVT Prophylaxis  :  SCD given significant thrombocytopenia  Lab Results  Component Value Date   PLT 46* 08/16/2015    Antibiotics  :   Anti-infectives    Start     Dose/Rate Route Frequency Ordered Stop   08/16/15 1200  vancomycin (VANCOCIN) IVPB 750  mg/150 ml premix  Status:  Discontinued     750 mg 150 mL/hr over 60 Minutes Intravenous Every M-W-F (Hemodialysis) 08/15/15 0747 08/15/15 1127   08/15/15 1600  piperacillin-tazobactam (ZOSYN) IVPB 2.25 g  Status:  Discontinued     2.25 g 100 mL/hr over 30 Minutes Intravenous Every 8 hours 08/15/15 0747 08/15/15 1127   08/15/15 0745  vancomycin (VANCOCIN) IVPB 1000 mg/200 mL premix     1,000 mg 200 mL/hr over 60 Minutes Intravenous STAT 08/15/15 0738 08/15/15 1000   08/15/15 0745  piperacillin-tazobactam (ZOSYN) IVPB 2.25 g     2.25 g 66.7 mL/hr over 30 Minutes Intravenous STAT 08/15/15 0738 08/15/15 0930        Objective:   Filed Vitals:   08/16/15 0348 08/16/15 0400 08/16/15 0816 08/16/15 1000  BP: 91/34 89/44 81/66  93/53  Pulse: 70 69 88 76  Temp: 97.9 F (36.6 C)  97.4 F (36.3 C)   TempSrc: Oral  Oral   Resp: 15 15 18 13   Height:      Weight: 76.2 kg (167 lb 15.9 oz)     SpO2: 100% 99% 96% 100%    Wt Readings from Last 3 Encounters:  08/16/15 76.2 kg (167 lb 15.9 oz)  06/29/15 82.101 kg (181 lb)  06/22/15 79.561 kg (175 lb 6.4 oz)     Intake/Output Summary (Last 24 hours) at 08/16/15 1030 Last data filed at 08/16/15 0800  Gross per 24 hour  Intake    660 ml  Output      0 ml  Net    660 ml     Physical Exam  Awake Alert,  [place].AT,PERRAL Supple Neck,No JVD, N Symmetrical Chest wall movement, Good air movement bilaterally,  RRR,No Gallops,Rubs or new Murmurs, No Parasternal Heave, right permacath +ve B.Sounds, Abd Soft, No tenderness, No organomegaly appriciated, No rebound - guarding or rigidity. No Cyanosis, Clubbing or edema, No new Rash or bruise     Data Review:  CBC  Recent Labs Lab 08/14/15 1805 08/15/15 0541 08/16/15 0245  WBC 3.7* 2.5* 2.4*  HGB 11.4* 13.0 10.6*  HCT 34.9* 39.1 30.5*  PLT 71* 38* 46*  MCV 99.1 98.2 95.9  MCH 32.4 32.7 33.3  MCHC 32.7 33.2 34.8  RDW 16.5* 16.4* 16.0*  LYMPHSABS 1.5 1.0  --   MONOABS 0.5 0.1  --     EOSABS 0.2 0.2  --   BASOSABS 0.1 0.0  --     Chemistries   Recent Labs Lab 08/14/15 1805 08/15/15 0540 08/16/15 0245  NA 127* 131* 130*  K 3.9 3.0* 3.1*  CL 90* 94* 96*  CO2 20* 20* 17*  GLUCOSE 98 77 99  BUN 25* 30* 40*  CREATININE 6.47* 7.03* 8.74*  CALCIUM 9.9 10.1 9.1  AST 74*  --   --   ALT 32  --   --   ALKPHOS 87  --   --   BILITOT 1.4*  --   --    ------------------------------------------------------------------------------------------------------------------ No results for input(s): CHOL, HDL, LDLCALC, TRIG, CHOLHDL, LDLDIRECT in the last 72 hours.  No results found for: HGBA1C ------------------------------------------------------------------------------------------------------------------ No results for input(s): TSH, T4TOTAL, T3FREE, THYROIDAB in the last 72 hours.  Invalid input(s): FREET3 ------------------------------------------------------------------------------------------------------------------ No results for input(s): VITAMINB12, FOLATE, FERRITIN, TIBC, IRON, RETICCTPCT in the last 72 hours.  Coagulation profile No results for input(s): INR, PROTIME in the last 168 hours.  No results for input(s): DDIMER in the last 72 hours.  Cardiac Enzymes  Recent Labs Lab 08/15/15 1525 08/15/15 1854 08/15/15 2100  TROPONINI 0.18* 0.15* 0.18*   ------------------------------------------------------------------------------------------------------------------ No results found for: BNP  Inpatient Medications  Scheduled Meds: . lactulose  30 g Oral BID  . levothyroxine  50 mcg Oral QAC breakfast  . midodrine  10 mg Oral BID WC  . pantoprazole  40 mg Oral BID AC   Continuous Infusions:  PRN Meds:.acetaminophen, acetaminophen, gi cocktail, HYDROcodone-acetaminophen, morphine injection, ondansetron (ZOFRAN) IV  Micro Results Recent Results (from the past 240 hour(s))  MRSA PCR Screening     Status: None   Collection Time: 08/15/15  2:12 AM   Result Value Ref Range Status   MRSA by PCR NEGATIVE NEGATIVE Final    Comment:        The GeneXpert MRSA Assay (FDA approved for NASAL specimens only), is one component of a comprehensive MRSA colonization surveillance program. It is not intended to diagnose MRSA infection nor to guide or monitor treatment for MRSA infections.     Radiology Reports Dg Chest 2 View  08/14/2015  CLINICAL DATA:  Hypotensive.  Diaphysis yesterday EXAM: CHEST  2 VIEW COMPARISON:  06/14/2015 FINDINGS: Right chest wall dialysis catheter is noted with tips in the right atrium. Mild cardiac enlargement. There is mild pulmonary edema. No pleural fluid identified. No airspace consolidation. Previous bilateral shoulder arthroplasty. IMPRESSION: Mild cardiac enlargement and mild interstitial edema. Electronically Signed   By: Signa Kell M.D.   On: 08/14/2015 19:02   Ct Head Wo Contrast  08/14/2015  CLINICAL DATA:  Headache, dizziness. EXAM: CT HEAD WITHOUT CONTRAST TECHNIQUE: Contiguous axial images were obtained from the base of the skull through the vertex without intravenous contrast. COMPARISON:  CT scan of June 14, 2015. FINDINGS: Bony calvarium appears intact. Mild diffuse cortical atrophy is noted. Mild chronic ischemic white matter disease is noted. No mass effect or midline shift is noted. Ventricular size is within normal limits. There is no evidence of mass lesion, hemorrhage or acute infarction.  IMPRESSION: Mild diffuse cortical atrophy. Mild chronic ischemic white matter disease. No acute intracranial abnormality seen. Electronically Signed   By: Lupita Raider, M.D.   On: 08/14/2015 20:51   US Pelvis Complete  08/15/2015  CLINICAL DATA:  Vaginal bleeding EXAM: TRANSABDOMINAL ULTRASOUND OF PELVIS TECHNIQUE: Transabdominal ultrasound examination of the pelvis was performed including evaluation of the uterus, ovaries, adnexal regions, and pelvic cul-de-sac. COMPARISON:  None. FINDINGS: Uterus  Measurements: 8.3 x 4.2 x 5.6 cm. No fibroids or other mass visualized. Endometrium Thickness: 16.7 mm.  Heterogeneous in appearance. Right ovary Measurements: 2.2 x 1.3 x 2.0 cm. Normal appearance/no adnexal mass. Left ovary Measurements: 3.4 x 2.2 x 2.5 cm. Normal appearance/no adnexal mass. Other findings:  No abnormal free fluid. IMPRESSION: 1. The endometrium is thickened measuring 16.7 mm. In the setting of post-menopausal bleeding, endometrial sampling is indicated to exclude carcinoma. If results are benign, sonohysterogram should be considered for focal lesion work-up. (Ref: Radiological Reasoning: Algorithmic Workup of Abnormal Vaginal Bleeding with Endovaginal Sonography and Sonohysterography. AJR 2008; 161:W96-04) Electronically Signed   By: Signa Kell M.D.   On: 08/15/2015 15:49    Time Spent in minutes  30 Minutes   Lindsie Simar M.D on 08/16/2015 at 10:30 AM  Between 7am to 7pm - Pager - [phone]  After 7pm go to [email or web address] - password [facility]  [facility] -  Office  [phone]

## 2015-08-16 NOTE — Evaluation (Signed)
Physical Therapy Evaluation Patient Details Name: Samantha [name] MRN: [id number] DOB: 08/25/1945 Today's Date: 08/16/2015   History of Present Illness  Pt is a 70 y.o. female with medical history significant of ESRD on HD(M/W/F), hypothyroidism, hypotension, cirrhosis, Lt shoulder arthritis, hepatitis B, HTN, anemia, dizziness; who presents with complaints of abdominal pain and dizziness, Workup was significant for elevated troponin and hypotension, blood pressure in ED 62/43.     Clinical Impression  Pt admitted with above diagnosis. Pt currently with functional limitations due to the deficits listed below (see PT Problem List). Although pt is very HOH she is able to answer some questions about PLOF and home layout w/ decent lip reading (see below).  Unclear on how much assist available to her at home. [facility] and deferred gait assessment this session due to BP 78/68 on BSC.  Pt will benefit from skilled PT to increase their independence and safety with mobility to allow discharge to the venue listed below.      Follow Up Recommendations SNF;Supervision for mobility/OOB    Equipment Recommendations  Rolling walker with 5" wheels    Recommendations for Other Services OT consult     Precautions / Restrictions Precautions Precautions: Fall Precaution Comments: watch BP Restrictions Weight Bearing Restrictions: No      Mobility  Bed Mobility Overal bed mobility: Needs Assistance Bed Mobility: Supine to Sit;Sit to Supine     Supine to sit: Min guard;HOB elevated Sit to supine: Min guard   General bed mobility comments: Increased time and effort w/ use of bed rail.  HOB elevated w/ supine>sit.  Transfers Overall transfer level: Needs assistance Equipment used: None Transfers: Sit to/from [employer];Stand Pivot Transfers Sit to Stand: Min assist Stand pivot transfers: Min assist       General transfer comment: Assist to steady w/ sit<>stand and stand  pivot, pt reaching out for armrests [facility].  BP dropped to 78/68 while sitting on BSC although pt denying dizziness and appears nonsymptomatic.  Ambulation/Gait             General Gait Details: deferred due to BP 78/68 [facility]  Stairs            Wheelchair Mobility    Modified Rankin (Stroke Patients Only)       Balance Overall balance assessment: Needs assistance Sitting-balance support: Bilateral upper extremity supported;Feet supported Sitting balance-Leahy Scale: Good Sitting balance - Comments: Able to perform Bil shoulder flexion AROM sitting EOB w/ slight posterior lean   Standing balance support: During functional activity;Single extremity supported Standing balance-Leahy Scale: Poor Standing balance comment: Reaching out for support when standing                             Pertinent Vitals/Pain Pain Assessment: Faces Faces Pain Scale: Hurts little more Pain Location: abdomen Pain Descriptors / Indicators: Grimacing;Discomfort Pain Intervention(s): Limited activity within patient's tolerance;Monitored during session;Repositioned;Premedicated before session    Home Living Family/patient expects to be discharged to:: Private residence Living Arrangements: Children;Other relatives (son and grandchildren) Available Help at Discharge: Family Type of Home: House Home Access: Level entry     Home Layout: One level Home Equipment:  (walking stick) Additional Comments: Although decent at lip reading, difficult to attain all information about home layout and assist availalbe at home.    Prior Function Level of Independence: Needs assistance   Gait / Transfers Assistance Needed: Uses  a walking stick when she goes to dialysis, unclear if she uses AD when ambulating around home.  ADL's / Homemaking Assistance Needed: Says if she's feeling good she can dress herself and bathe herself, otherwise her grandson assists her.         Hand Dominance        Extremity/Trunk Assessment   Upper Extremity Assessment: Generalized weakness           Lower Extremity Assessment: Generalized weakness      Cervical / Trunk Assessment: Kyphotic  Communication   Communication: HOH (very HOH, hears best out of Lt ear, decent lip reading)  Cognition Arousal/Alertness: Awake/alert Behavior During Therapy: WFL for tasks assessed/performed Overall Cognitive Status: Difficult to assess                      General Comments General comments (skin integrity, edema, etc.): BP at start of session supine 93/53, sitting on BSC 78/68, supine at end of session 101/63    Exercises General Exercises - Upper Extremity Shoulder Flexion: AROM;Both;10 reps;Seated;Limitations Shoulder Flexion Limitations: limited to ~ 100 deg General Exercises - Lower Extremity Ankle Circles/Pumps: AROM;Both;10 reps;Seated;Supine Long Arc Quad: AROM;Both;10 reps;Seated      Assessment/Plan    PT Assessment Patient needs continued PT services  PT Diagnosis Difficulty walking;Generalized weakness;Acute pain   PT Problem List Decreased strength;Decreased activity tolerance;Decreased balance;Decreased mobility;Decreased knowledge of use of DME;Decreased safety awareness;Pain  PT Treatment Interventions DME instruction;Gait training;Functional mobility training;Therapeutic activities;Therapeutic exercise;Balance training;Patient/family education   PT Goals (Current goals can be found in the Care Plan section) Acute Rehab PT Goals Patient Stated Goal: none stated PT Goal Formulation: With patient Time For Goal Achievement: 08/30/15 Potential to Achieve Goals: Good    Frequency Min 3X/week   Barriers to discharge   [place] to confirm amount of assist available at home    Co-evaluation               End of Session   Activity Tolerance: Patient limited by fatigue Patient left: in bed;with call bell/phone within reach;with  bed alarm set Nurse Communication: Mobility status;Other (comment) (pt w/ loose stool)    Functional Assessment Tool Used: Clinical Judgement Functional Limitation: Mobility: Walking and moving around Mobility: Walking and Moving Around Current Status (Z6109): At least 20 percent but less than 40 percent impaired, limited or restricted Mobility: Walking and Moving Around Goal Status [phone]): At least 1 percent but less than 20 percent impaired, limited or restricted    Time: 1019-1042 PT Time Calculation (min) (ACUTE ONLY): 23 min   Charges:   PT Evaluation $PT Eval Moderate Complexity: 1 Procedure PT Treatments $Therapeutic Exercise: 8-22 mins   PT G Codes:   PT G-Codes **NOT FOR INPATIENT CLASS** Functional Assessment Tool Used: Clinical Judgement Functional Limitation: Mobility: Walking and moving around Mobility: Walking and Moving Around Current Status (U9811): At least 20 percent but less than 40 percent impaired, limited or restricted Mobility: Walking and Moving Around Goal Status [phone]): At least 1 percent but less than 20 percent impaired, limited or restricted   Encarnacion Chu PT, DPT  Pager: [phone] Phone: [phone] 08/16/2015, 11:02 AM

## 2015-08-16 NOTE — Progress Notes (Signed)
Pt started with SBP in low 90's. Dr Arlean HoppingSchertz ordered not to pull anything and give NS bolus. Ending BP still in the low 90's SBP. Pt is alert and responsive.

## 2015-08-16 NOTE — Consult Note (Signed)
[facility] Renal Consultation Note    Indication for Consultation:  Management of ESRD/hemodialysis; anemia, hypertension/volume and secondary hyperparathyroidism PCP: Dorrene GermanEdwin A Avbuere, MD   HPI: Samantha [name] is a 70 y.o. female with ESRD unknown etiology on HD since 04/23/2010. Patient has hemodialysis MWF at [facility]. PMH includes hypertension, hypothyroidism, DM, cirrhosis of liver, secondary hyperparathyroidism, anemia of chronic disease, arithritis, gastric ulcer, hepatitis B, impaired hearing.  Patient presented to [facility] ED 08/14/15 with C/O abdominal pain, dizziness and vaginal bleeding. Lactic acid 1.7, Troponin levels were elevated-0.21, EKG nonspecific T wave abnormality. CXR showed mild interstitial edema. CT of head did not show any acute abnormalities. NA 127 K+3.9 Cl 90 Src 6.47 BUN 25. AST 74 tbili 1.4 lipase 141 ammonia 54 WBC 3.7 HGB 11.4 plt 71. Patient rec'd 1 liter of IVF in ED with stabilization of hypotension. She has been adm for hypotension, abdominal pain and to evaluate vaginal bleeding. Cardiology has been consulted. Currently patient is awake, very HOH, difficult to get history from patient D/T hearing issues and desire to eat her lunch. No C/O chest pain, dizziness, SOB, no C/O abdominal pain (did have C/O earlier this AM-rec'd vicodin and is pain free at present. Denies fever, chills, no C/O N/V/D. Unable to tell me when vaginal bleeding began.  Patient has hemodialysis MWF, is compliant with HD prescription. Has had issues with hypotension, has been taking midodrine prior to and during HD treatments. Last in-center labs as follows: HGB 12.5 RBC 3.71 WBC 3.49 Plt 81 Ferritin 532 Fe 105 Tsat 41% TIBC 255 Ca 10.1 C Ca 10.4 Phos 4.6 (08/11/15) Last PTH 272 (05/12/15). Patient is not on ESA, has been on weekly venofer. She takes calcium acetate binders, VRDA for phos/PTH suppression.   Past Medical History  Diagnosis Date  . Shoulder pain    . Headache(784.0)     occasionally  . Dizziness   . Occasional numbness/prickling/tingling of fingers and toes   . Arthritis     left shoulder  . Joint pain   . Joint swelling   . Gastric ulcer   . Dry skin   . Peripheral edema   . Constipation   . Oligouria   . H/O: GI bleed   . Hepatitis     Hep B  . History of kidney stones   . History of blood transfusion   . Hypertension   . Anemia   . Hypothyroidism     takes Synthroid daily  . Impaired hearing   . Diabetes mellitus     borderline  . Renal disorder     m, w, F    Past Surgical History  Procedure Laterality Date  . Shoulder surgery  3-265yrs ago    right replacement  . Esophagogastroduodenoscopy  06/09/2011    Procedure: ESOPHAGOGASTRODUODENOSCOPY (EGD);  Surgeon: Theda BelfastPatrick D Hung, MD;  [facility];  Service: Endoscopy;  Laterality: N/A;  . Cataract surgery      bilateral  . Reverse shoulder arthroplasty  09/22/2011    Procedure: REVERSE SHOULDER ARTHROPLASTY;  Surgeon: Verlee RossettiSteven R Norris, MD;  [facility];  Service: Orthopedics;  Laterality: Left;  left reverse shoulder arthroplasty  . Cholecystectomy  12/26/2011  . Cholecystectomy  12/26/2011    Procedure: LAPAROSCOPIC CHOLECYSTECTOMY;  Surgeon: Atilano InaEric M Wilson, MD,FACS;  Location: [facility] OR;  Service: General;  Laterality: N/A;  . Arteriovenous graft placement Left     forearm  . Shuntogram Left 08/29/2012    Procedure: SHUNTOGRAM;  Surgeon: Fransisco Hertz, MD;  Location[facility] CATH LAB;  Service: Cardiovascular;  Laterality: Left;  arm  . Revision of arteriovenous goretex graft Left 01/12/2015    Procedure: REVISION OF Left FOREARM ARTERIOVENOUS GORETEX GRAFT;  Surgeon: Fransisco Hertz, MD;  Location: [facility] OR;  Service: Vascular;  Laterality: Left;  . Insertion of dialysis catheter Right 01/12/2015    Procedure: INSERTION OF DIALYSIS CATHETER;  Surgeon: Fransisco Hertz, MD;  Location: [facility] OR;  Service: Vascular;  Laterality: Right;  . I&d extremity Left 03/23/2015     Procedure: DRAINAGE OF LEFT ARM SEROMA;  Surgeon: Fransisco Hertz, MD;  Location: [facility] OR;  Service: Vascular;  Laterality: Left;  . Ligation arteriovenous gortex graft Left 03/23/2015    Procedure: LIGATION AND EXCISION OF LEFT ARTERIOVENOUS GORTEX GRAFT;  Surgeon: Fransisco Hertz, MD;  Location: [facility] OR;  Service: Vascular;  Laterality: Left;   Family History  Problem Relation Age of Onset  . Hypertension Mother   . Diabetes Mother   . Cancer Brother    Social History:  reports that she has never smoked. Her smokeless tobacco use includes Chew. She reports that she does not drink alcohol or use illicit drugs. Allergies  Allergen Reactions  . Banana Other (See Comments)    Due to dialysis  . Chocolate Other (See Comments)    Due to dialysis  . Fish-Derived Products Other (See Comments)    Due to gout   Prior to Admission medications   Medication Sig Start Date End Date Taking? Authorizing Provider  acetaminophen (TYLENOL) 500 MG tablet Take 500 mg by mouth every 6 (six) hours as needed for moderate pain.   Yes Historical Provider, MD  HYDROcodone-acetaminophen (NORCO/VICODIN) 5-325 MG tablet Take 1 tablet by mouth every 6 (six) hours as needed for moderate pain.   Yes Historical Provider, MD  lactulose (CHRONULAC) 10 GM/15ML solution Take 45 mLs (30 g total) by mouth 2 (two) times daily. 06/18/15  Yes Rolly Salter, MD  levothyroxine (SYNTHROID, LEVOTHROID) 50 MCG tablet Take 50 mcg by mouth daily before breakfast.  04/15/15  Yes Historical Provider, MD  metoprolol (LOPRESSOR) 50 MG tablet Take 50 mg by mouth every morning.   Yes Historical Provider, MD  midodrine (PROAMATINE) 10 MG tablet Take 1 tablet (10 mg total) by mouth every Monday, Wednesday, and Friday with hemodialysis. 06/18/15  Yes Rolly Salter, MD  pantoprazole (PROTONIX) 40 MG tablet Take 1 tablet (40 mg total) by mouth 2 (two) times daily before a meal. 06/18/15  Yes Rolly Salter, MD   Current Facility-Administered Medications   Medication Dose Route Frequency Provider Last Rate Last Dose  . acetaminophen (TYLENOL) tablet 500 mg  500 mg Oral Q6H PRN Clydie Braun, MD      . acetaminophen (TYLENOL) tablet 650 mg  650 mg Oral Q4H PRN Clydie Braun, MD      . gi cocktail (Maalox,Lidocaine,Donnatal)  30 mL Oral QID PRN Clydie Braun, MD      . HYDROcodone-acetaminophen (NORCO/VICODIN) 5-325 MG per tablet 1 tablet  1 tablet Oral Q6H PRN Clydie Braun, MD   1 tablet at 08/16/15 1014  . lactulose (CHRONULAC) 10 GM/15ML solution 30 g  30 g Oral TID Starleen Arms, MD      . levothyroxine (SYNTHROID, LEVOTHROID) tablet 50 mcg  50 mcg Oral QAC breakfast Clydie Braun, MD   50 mcg at 08/16/15 1002  . midodrine (PROAMATINE) tablet 10 mg  10  mg Oral BID WC Leana Roe Elgergawy, MD      . morphine 2 MG/ML injection 2 mg  2 mg Intravenous Q2H PRN Clydie Braun, MD      . ondansetron (ZOFRAN) injection 4 mg  4 mg Intravenous Q6H PRN Clydie Braun, MD      . pantoprazole (PROTONIX) EC tablet 40 mg  40 mg Oral BID AC Clydie Braun, MD   40 mg at 08/16/15 1002   Labs: Basic Metabolic Panel:  Recent Labs Lab 08/14/15 1805 08/15/15 0540 08/16/15 0245  NA 127* 131* 130*  K 3.9 3.0* 3.1*  CL 90* 94* 96*  CO2 20* 20* 17*  GLUCOSE 98 77 99  BUN 25* 30* 40*  CREATININE 6.47* 7.03* 8.74*  CALCIUM 9.9 10.1 9.1  PHOS  --  6.6*  --    Liver Function Tests:  Recent Labs Lab 08/14/15 1805 08/15/15 0540  AST 74*  --   ALT 32  --   ALKPHOS 87  --   BILITOT 1.4*  --   PROT 7.6  --   ALBUMIN 3.0* 3.2*    Recent Labs Lab 08/14/15 1805  LIPASE 141*    Recent Labs Lab 08/15/15 0210  AMMONIA 54*   CBC:  Recent Labs Lab 08/14/15 1805 08/15/15 0541 08/16/15 0245  WBC 3.7* 2.5* 2.4*  NEUTROABS 1.5* 1.2*  --   HGB 11.4* 13.0 10.6*  HCT 34.9* 39.1 30.5*  MCV 99.1 98.2 95.9  PLT 71* 38* 46*   Cardiac Enzymes:  Recent Labs Lab 08/14/15 1805 08/15/15 1525 08/15/15 1854 08/15/15 2100   TROPONINI 0.21* 0.18* 0.15* 0.18*   CBG:  Recent Labs Lab 08/14/15 1942  GLUCAP 80   Studies/Results: Dg Chest 2 View  08/14/2015  CLINICAL DATA:  Hypotensive.  Diaphysis yesterday EXAM: CHEST  2 VIEW COMPARISON:  06/14/2015 FINDINGS: Right chest wall dialysis catheter is noted with tips in the right atrium. Mild cardiac enlargement. There is mild pulmonary edema. No pleural fluid identified. No airspace consolidation. Previous bilateral shoulder arthroplasty. IMPRESSION: Mild cardiac enlargement and mild interstitial edema. Electronically Signed   By: Signa Kell M.D.   On: 08/14/2015 19:02   Ct Head Wo Contrast  08/14/2015  CLINICAL DATA:  Headache, dizziness. EXAM: CT HEAD WITHOUT CONTRAST TECHNIQUE: Contiguous axial images were obtained from the base of the skull through the vertex without intravenous contrast. COMPARISON:  CT scan of June 14, 2015. FINDINGS: Bony calvarium appears intact. Mild diffuse cortical atrophy is noted. Mild chronic ischemic white matter disease is noted. No mass effect or midline shift is noted. Ventricular size is within normal limits. There is no evidence of mass lesion, hemorrhage or acute infarction. IMPRESSION: Mild diffuse cortical atrophy. Mild chronic ischemic white matter disease. No acute intracranial abnormality seen. Electronically Signed   By: Lupita Raider, M.D.   On: 08/14/2015 20:51   US Pelvis Complete  08/15/2015  CLINICAL DATA:  Vaginal bleeding EXAM: TRANSABDOMINAL ULTRASOUND OF PELVIS TECHNIQUE: Transabdominal ultrasound examination of the pelvis was performed including evaluation of the uterus, ovaries, adnexal regions, and pelvic cul-de-sac. COMPARISON:  None. FINDINGS: Uterus Measurements: 8.3 x 4.2 x 5.6 cm. No fibroids or other mass visualized. Endometrium Thickness: 16.7 mm.  Heterogeneous in appearance. Right ovary Measurements: 2.2 x 1.3 x 2.0 cm. Normal appearance/no adnexal mass. Left ovary Measurements: 3.4 x 2.2 x 2.5 cm.  Normal appearance/no adnexal mass. Other findings:  No abnormal free fluid. IMPRESSION: 1. The endometrium is thickened measuring 16.7  mm. In the setting of post-menopausal bleeding, endometrial sampling is indicated to exclude carcinoma. If results are benign, sonohysterogram should be considered for focal lesion work-up. (Ref: Radiological Reasoning: Algorithmic Workup of Abnormal Vaginal Bleeding with Endovaginal Sonography and Sonohysterography. AJR 2008; 161:W96-04) Electronically Signed   By: Signa Kell M.D.   On: 08/15/2015 15:49    ROS: As per HPI otherwise negative.   Physical Exam: Filed Vitals:   08/16/15 0400 08/16/15 0816 08/16/15 1000 08/16/15 1136  BP: 101/64  Pulse: 69 88 76 73  Temp:  97.4 F (36.3 C)  97.8 F (36.6 C)  TempSrc:  Oral  Oral  Resp: Height:      Weight:      SpO2: 99% 96% 100% 100%     General: Well developed, well nourished, in no acute distress. Head: Normocephalic, atraumatic, sclera non-icteric, mucus membranes are moist Neck: Supple. JVD not elevated. Lungs: Clear bilaterally to auscultation without wheezes, rales, or rhonchi. Breathing is unlabored. Heart: RRR with S1 S2. No murmurs, rubs, or gallops appreciated. SR on monitor RAte 70s/  Abdomen: Soft, tender upper quadrants, no guarding or rebound tenderness.  M-S:  Strength and tone appear normal for age. Lower extremities:Bilateral LE edema RLE Trace LLE.  Neuro: Alert and oriented X 3. Moves all extremities spontaneously. Psych:  Responds to questions appropriately with a normal affect. Dialysis Access: Lorne Skeens cath Drsg CDI.   Dialysis Orders:  [facility] on MWF . 4 hrs 0 min, 160NRe Optiflux,  BFR 400, DFR Autoflow 1.5,  EDW 75 (kg), 2.0 K, 2.5 Ca  UFR Profile: Profile 4 Sodium Model: Linear Access: Catheter-Tunneled Heparin: No heparin Hectoral: 2 mcg IV Q MWF   Assessment/Plan: 1.  Hypotension: Chronic hypotension with midodrine use. Rec'd 1 liter  of IVF in ED. Lactic acid 1.7 then 2.1 initially started on Vanc/Zosyn. Have been dc'd. Cortisol 8.8 AM level.  2.  Elevated Troponin's: Troponin. 0.18, 0.15, 0.18 flat without upward trend in the setting of ESRD. Cardiology consulted. Probable demand ischemia. Echo pending. 3.  Vaginal bleeding:  US done-shows The endometrium is thickened measuring 16.7 mm Endometrial sampling will be needed to R/O carcinoma. No bleeding reported today per nurse. Per primary. 4.  Abdominal Pain: Chronic issue-per primary. Lipase 141 (chronic) T bili 1.4.  5.  Thrombocytopenia: Chronic issue. Per primary.  6.  ESRD -  MWF at [facility]. Will have HD today on schedule. K+ 3.0 Use 4.0 K bath.  7.  Hypertension/volume  - SBP 102/63. Takes midodrine 10 mg PO on HD days only as OP. Has been started on Midodrine 10 mg PO BID per primary. Wt today 76.2 kg (bed wt) rec'd 1 liter of IVF in ED had mild interstitial edema on CXR. Attempt max UF goal without exacerbating  hypotension.  8.  Anemia  - HGB 10.6 at present. No OP ESA. Is on weekly venofer Last in-center Tsat 41 (08/11/15) will hold venofer.  9.  Metabolic bone disease -  Ca 9.1 C Ca 9.74 phos 6.6. Cont VDRA, binders as ordered. 10.  Nutrition - Albumin 3.2 renal diet/fld restrictions. Add renal vit,   Rita H. Manson Passey, NP-C 08/16/2015, 12:57 PM  [facility] Beeper [phone]  Pt seen, examined, agree w assess/plan as above with additions as indicated. ESRD patient on chronic hemodialysis.  EMS was called for abd pain, when they got there she said it was "chronic".  Her BP was low in the mid 70's  which family said was not unusual for her. She was brought to ED.  Here she is in no distress.  Unable to pull any fluid on HD, bp dropped into the 50's.  Pt poor historian. CXR was interpreted as "mild IS edema", doubt very much she has pulm edema.  Is asymptomatic w/o any edema on exam and clear lungs w SaO2 of 100% on room air. In fact her dry weight might need to  be raised, there is no history of any admission for CHF in all of her six admissions her dating back to 2013.  She has adipose tissue in the ankles which on the surface looks like edema but really isn't.  Would recommend dc home, no fever here and no evidence sepsis or other acute problems.  Pt is at baseline. Raise dry weight by 1 kg or so.  Vinson Moselle MD [employer] pager [phone]    cell [phone] 08/16/2015, 4:26 PM

## 2015-08-17 ENCOUNTER — Observation Stay (HOSPITAL_COMMUNITY): Payer: Medicare Other

## 2015-08-17 DIAGNOSIS — R079 Chest pain, unspecified: Secondary | ICD-10-CM | POA: Diagnosis not present

## 2015-08-17 DIAGNOSIS — Z79899 Other long term (current) drug therapy: Secondary | ICD-10-CM | POA: Diagnosis not present

## 2015-08-17 DIAGNOSIS — H9190 Unspecified hearing loss, unspecified ear: Secondary | ICD-10-CM | POA: Diagnosis present

## 2015-08-17 DIAGNOSIS — I248 Other forms of acute ischemic heart disease: Secondary | ICD-10-CM | POA: Diagnosis present

## 2015-08-17 DIAGNOSIS — Z8249 Family history of ischemic heart disease and other diseases of the circulatory system: Secondary | ICD-10-CM | POA: Diagnosis not present

## 2015-08-17 DIAGNOSIS — Z833 Family history of diabetes mellitus: Secondary | ICD-10-CM | POA: Diagnosis not present

## 2015-08-17 DIAGNOSIS — Z66 Do not resuscitate: Secondary | ICD-10-CM | POA: Diagnosis present

## 2015-08-17 DIAGNOSIS — D696 Thrombocytopenia, unspecified: Secondary | ICD-10-CM | POA: Diagnosis not present

## 2015-08-17 DIAGNOSIS — N186 End stage renal disease: Secondary | ICD-10-CM | POA: Diagnosis present

## 2015-08-17 DIAGNOSIS — G9340 Encephalopathy, unspecified: Secondary | ICD-10-CM | POA: Diagnosis present

## 2015-08-17 DIAGNOSIS — E876 Hypokalemia: Secondary | ICD-10-CM | POA: Diagnosis present

## 2015-08-17 DIAGNOSIS — Z992 Dependence on renal dialysis: Secondary | ICD-10-CM | POA: Diagnosis not present

## 2015-08-17 DIAGNOSIS — E039 Hypothyroidism, unspecified: Secondary | ICD-10-CM | POA: Diagnosis present

## 2015-08-17 DIAGNOSIS — I5032 Chronic diastolic (congestive) heart failure: Secondary | ICD-10-CM | POA: Diagnosis present

## 2015-08-17 DIAGNOSIS — I959 Hypotension, unspecified: Secondary | ICD-10-CM | POA: Diagnosis not present

## 2015-08-17 DIAGNOSIS — K7469 Other cirrhosis of liver: Secondary | ICD-10-CM | POA: Diagnosis not present

## 2015-08-17 DIAGNOSIS — E871 Hypo-osmolality and hyponatremia: Secondary | ICD-10-CM | POA: Diagnosis present

## 2015-08-17 DIAGNOSIS — G8929 Other chronic pain: Secondary | ICD-10-CM | POA: Diagnosis present

## 2015-08-17 DIAGNOSIS — Z96612 Presence of left artificial shoulder joint: Secondary | ICD-10-CM | POA: Diagnosis present

## 2015-08-17 DIAGNOSIS — N2581 Secondary hyperparathyroidism of renal origin: Secondary | ICD-10-CM | POA: Diagnosis present

## 2015-08-17 DIAGNOSIS — I132 Hypertensive heart and chronic kidney disease with heart failure and with stage 5 chronic kidney disease, or end stage renal disease: Secondary | ICD-10-CM | POA: Diagnosis present

## 2015-08-17 DIAGNOSIS — R938 Abnormal findings on diagnostic imaging of other specified body structures: Secondary | ICD-10-CM | POA: Diagnosis present

## 2015-08-17 DIAGNOSIS — D631 Anemia in chronic kidney disease: Secondary | ICD-10-CM | POA: Diagnosis present

## 2015-08-17 DIAGNOSIS — E869 Volume depletion, unspecified: Secondary | ICD-10-CM | POA: Diagnosis present

## 2015-08-17 DIAGNOSIS — Z91018 Allergy to other foods: Secondary | ICD-10-CM | POA: Diagnosis not present

## 2015-08-17 DIAGNOSIS — M1990 Unspecified osteoarthritis, unspecified site: Secondary | ICD-10-CM | POA: Diagnosis present

## 2015-08-17 DIAGNOSIS — I9589 Other hypotension: Secondary | ICD-10-CM | POA: Diagnosis present

## 2015-08-17 DIAGNOSIS — N189 Chronic kidney disease, unspecified: Secondary | ICD-10-CM | POA: Diagnosis not present

## 2015-08-17 DIAGNOSIS — R109 Unspecified abdominal pain: Secondary | ICD-10-CM | POA: Diagnosis present

## 2015-08-17 DIAGNOSIS — D6959 Other secondary thrombocytopenia: Secondary | ICD-10-CM | POA: Diagnosis present

## 2015-08-17 DIAGNOSIS — Z96611 Presence of right artificial shoulder joint: Secondary | ICD-10-CM | POA: Diagnosis present

## 2015-08-17 DIAGNOSIS — K7460 Unspecified cirrhosis of liver: Secondary | ICD-10-CM | POA: Diagnosis present

## 2015-08-17 DIAGNOSIS — Z9049 Acquired absence of other specified parts of digestive tract: Secondary | ICD-10-CM | POA: Diagnosis not present

## 2015-08-17 DIAGNOSIS — N939 Abnormal uterine and vaginal bleeding, unspecified: Secondary | ICD-10-CM | POA: Diagnosis present

## 2015-08-17 DIAGNOSIS — R42 Dizziness and giddiness: Secondary | ICD-10-CM | POA: Diagnosis present

## 2015-08-17 DIAGNOSIS — Z96641 Presence of right artificial hip joint: Secondary | ICD-10-CM | POA: Diagnosis present

## 2015-08-17 LAB — CBC
HEMATOCRIT: 28.7 % — AB (ref 36.0–46.0)
HEMOGLOBIN: 9.8 g/dL — AB (ref 12.0–15.0)
MCH: 32.6 pg (ref 26.0–34.0)
MCHC: 34.1 g/dL (ref 30.0–36.0)
MCV: 95.3 fL (ref 78.0–100.0)
Platelets: 57 10*3/uL — ABNORMAL LOW (ref 150–400)
RBC: 3.01 MIL/uL — AB (ref 3.87–5.11)
RDW: 16.1 % — AB (ref 11.5–15.5)
WBC: 1.9 10*3/uL — AB (ref 4.0–10.5)

## 2015-08-17 LAB — BASIC METABOLIC PANEL
ANION GAP: 10 (ref 5–15)
BUN: 11 mg/dL (ref 6–20)
CALCIUM: 8.1 mg/dL — AB (ref 8.9–10.3)
CHLORIDE: 101 mmol/L (ref 101–111)
CO2: 25 mmol/L (ref 22–32)
Creatinine, Ser: 4.51 mg/dL — ABNORMAL HIGH (ref 0.44–1.00)
GFR calc non Af Amer: 9 mL/min — ABNORMAL LOW (ref >60)
GFR, EST AFRICAN AMERICAN: 10 mL/min — AB (ref >60)
Glucose, Bld: 86 mg/dL (ref 65–99)
Potassium: 3.3 mmol/L — ABNORMAL LOW (ref 3.5–5.1)
Sodium: 136 mmol/L (ref 135–145)

## 2015-08-17 LAB — ECHOCARDIOGRAM COMPLETE
Height: 60 in
Weight: 2779.56 oz

## 2015-08-17 LAB — AMMONIA: Ammonia: 40 umol/L — ABNORMAL HIGH (ref 9–35)

## 2015-08-17 MED ORDER — COSYNTROPIN 0.25 MG IJ SOLR
0.2500 mg | Freq: Once | INTRAMUSCULAR | Status: AC
  Administered 2015-08-18: 0.25 mg via INTRAVENOUS
  Filled 2015-08-17: qty 0.25

## 2015-08-17 MED ORDER — SODIUM CHLORIDE 0.9 % IV BOLUS (SEPSIS)
500.0000 mL | Freq: Once | INTRAVENOUS | Status: AC
  Administered 2015-08-17: 500 mL via INTRAVENOUS

## 2015-08-17 NOTE — Clinical Social Work Note (Signed)
CSW received consult for patient needing SNF for short term rehab.  CSW attempted to see patient to complete assessment and she requested I contact her grandson Onalee HuaDavid to discuss SNF options.  CSW left message on grandson's phone, awaiting call back.  Ervin KnackEric R. Koen Antilla, MSW, Theresia MajorsLCSWA [phone] 08/17/2015 4:06 PM

## 2015-08-17 NOTE — Clinical Social Work Note (Addendum)
CSW spoke to patient's grandaughter [facility] [phone] who said [facility] has been at [facility] and they would like her to return to facility for short term rehab if there is a bed available.  CSW informed patient's grandaughter that if [facility] does not have any beds available, then they would have to consider other facilities.  CSW was given permission by patient and her grandaughter to begin bed search process.  CSW to continue to follow patient's progress.  Ervin KnackEric R. Bettylee Feig, MSW, Theresia MajorsLCSWA [phone] 08/17/2015 5:49 PM

## 2015-08-17 NOTE — Care Management Obs Status (Signed)
MEDICARE OBSERVATION STATUS NOTIFICATION   Patient Details  Name: Samantha [name] MRN: [id number] Date of Birth: Dec 20, 1945   Medicare Observation Status Notification Given:  Yes    Hanley HaysDowell, Alegra Rost T, RN 08/17/2015, 9:39 AM

## 2015-08-17 NOTE — Progress Notes (Deleted)
  Echocardiogram 2D Echocardiogram has been performed.  Samantha [name] 08/17/2015, 1:09 PM

## 2015-08-17 NOTE — Progress Notes (Signed)
PROGRESS NOTE                                                                                                                                                                                                             Patient Demographics:    Samantha [name], is a 70 y.o. female, DOB - 1945/12/19, BJY:[id number]  Admit date - 08/14/2015   Admitting Physician Clydie Braun, MD  Outpatient Primary MD for the patient is Dorrene German, MD  LOS -   Outpatient Specialists:   Chief Complaint  Patient presents with  . Hypotension  . Shortness of Breath       Brief Narrative  70 y.o. female with medical history significant of ESRD on HD(M/W/F), hypothyroidism, hypotension, cirrhosis, osteoarthritis, HTN, anemia, dizziness; who presents with complaints of abdominal pain and dizziness, Workup was significant for elevated troponin and hypotension, blood pressure in ED 62/43, Hypotension resolved with fluid bolus, seen by cardiology for elevated troponin, thought to be secondary to demand ischemia from hypotension, as well with vaginal spotting, US pelvis with increased endometrial thickness, to follow with GYN as an outpatient for endometrial sampling, blood pressure remains above during hospital stay, but her 77/66 this morning requiring fluid bolus.  Subjetive:    [name] today has, No headache, No chest pain, NoFurther abdominal pain, Extremely hard of hearing   Assessment  & Plan :    Principal Problem:   Hypotension Active Problems:   Anemia in chronic kidney disease (CKD)   Abdominal pain   Hyponatremia   Thrombocytopenia (HCC)   Cirrhosis of liver (HCC)   ESRD on dialysis (HCC)   Vaginal bleeding    hypotension with dizziness  - Presents with low blood pressure in ED 62/43 , resolved with IV fluid bolus  - Patient does not appear to be septic , lactic acid 1.7 on admission, afebrile, no leukocytosis , blood cultures remain negative,  start broad-spectrum IV antibiotics 5/1 - Follow on blood cultures , remains negative to date - follow 2-D echo, echo done, reading still pending - cam cortisol 8.8, within normal range - 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 - Midodrin has been increased from 10 mg daily on dialysis days,  to 10 mg twice a day.  Elevated troponin  - Cardiology consult appreciated, his is most likely in the setting of demand ischemia, follow 2-D echo, otherwise no further workup recommended .  Abdominal pain  - Patient with known history of chronic abdominal pain, benign physical exam abdominal exam since admission, denies any further abdominal pain - Lipase elevated at 141, but by reviewing previous records it was consistently elevated   Vaginal spotting - ultrasound pelvis with significant endometrial thickening, will need endometrial sampling as an outpatient, discussed with Dr. Adrian Blackwater, they will arrange for outpatient follow-up, and this was conveyed to granddaughter who understand the importance of outpatient follow-up, actually she is telling me they are rescheduling her previous appointment(as she missed outpatient follow-up with GYN service secondary to dialysis).  ESRD - Renal consulted, on hemodialysis Monday Wednesday Friday  Cirrhosis - Patient with just a mildly elevated ammonia level 54 - will increase lactulose to 3 times a day given she is  still lethargic  Encephalopathy - Patient is delayed response and sluggish speech, she is extremely hard of hearing, slightly worse than her baseline as per granddaughter, so MRI brain has been obtained, no evidence of acute infarct, CT head with no acute finding, had mildly elevated ammonia level, so lactulose has been increased ammonia 54> 40.  Diastolic CHF - last EF 65-75% with grade 2  diastolic dysfunction  - Monitor closely as she required multiple boluses before soft blood pressure - Follow-up echo - Volume management with dialysis  Anemia of chronic renal  Disease -  Hemoglobin of 11.4 on admission - Continue to monitor   Thrombocytopenia (HCC): -  Likely due to cirrhosis. Platelet 71 on admission,  no need for transfusion, will use SCD for DVT prophylaxis   Hypothyroidism - continue Levothyroxine  Code Status : Full code , confirmed by grandaughter  Family Communication  : Spoke with granddaughter Ms Margit Hanks via phone 5/2  Disposition Plan  : We'll need SNF placement  Barriers For Discharge : Hypotension  Consults  :  Cardiology/renal  Procedures  :   DVT Prophylaxis  :  SCD given significant thrombocytopenia  Lab Results  Component Value Date   PLT 57* 08/17/2015    Antibiotics  :   Anti-infectives    Start     Dose/Rate Route Frequency Ordered Stop   08/16/15 1200  vancomycin (VANCOCIN) IVPB 750 mg/150 ml premix  Status:  Discontinued     750 mg 150 mL/hr over 60 Minutes Intravenous Every M-W-F (Hemodialysis) 08/15/15 0747 08/15/15 1127   08/15/15 1600  piperacillin-tazobactam (ZOSYN) IVPB 2.25 g  Status:  Discontinued     2.25 g 100 mL/hr over 30 Minutes Intravenous Every 8 hours 08/15/15 0747 08/15/15 1127   08/15/15 0745  vancomycin (VANCOCIN) IVPB 1000 mg/200 mL premix     1,000 mg 200 mL/hr over 60 Minutes Intravenous STAT 08/15/15 0738 08/15/15 1000   08/15/15 0745  piperacillin-tazobactam (ZOSYN) IVPB 2.25 g     2.25 g 66.7 mL/hr over 30 Minutes Intravenous STAT 08/15/15 0738 08/15/15 0930        Objective:   Filed Vitals:   08/17/15 0900 08/17/15 1000 08/17/15 1205 08/17/15 1400  BP: 102/57 91/62 77/66  105/69  Pulse: 73 117 82   Temp:   98.6 F (37 C)   TempSrc:   Oral   Resp: 13 16 13 18   Height:      Weight:      SpO2: 100% 100% 100%  Wt Readings from Last 3 Encounters:  08/17/15 78.8 kg (173 lb 11.6 oz)    06/29/15 82.101 kg (181 lb)  06/22/15 79.561 kg (175 lb 6.4 oz)     Intake/Output Summary (Last 24 hours) at 08/17/15 1559 Last data filed at 08/17/15 1205  Gross per 24 hour  Intake    600 ml  Output   -824 ml  Net   1424 ml     Physical Exam  Awake Alert,  [place].AT,PERRAL Supple Neck,No JVD, N Symmetrical Chest wall movement, Good air movement bilaterally,  RRR,No Gallops,Rubs or new Murmurs, No Parasternal Heave, right permacath +ve B.Sounds, Abd Soft, No tenderness, No organomegaly appriciated, No rebound - guarding or rigidity. No Cyanosis, Clubbing or edema, No new Rash or bruise     Data Review:    CBC  Recent Labs Lab 08/14/15 1805 08/15/15 0541 08/16/15 0245 08/17/15 0245  WBC 3.7* 2.5* 2.4* 1.9*  HGB 11.4* 13.0 10.6* 9.8*  HCT 34.9* 39.1 30.5* 28.7*  PLT 71* 38* 46* 57*  MCV 99.1 98.2 95.9 95.3  MCH 32.4 32.7 33.3 32.6  MCHC 32.7 33.2 34.8 34.1  RDW 16.5* 16.4* 16.0* 16.1*  LYMPHSABS 1.5 1.0  --   --   MONOABS 0.5 0.1  --   --   EOSABS 0.2 0.2  --   --   BASOSABS 0.1 0.0  --   --     Chemistries   Recent Labs Lab 08/14/15 1805 08/15/15 0540 08/16/15 0245 08/17/15 0245  NA 127* 131* 130* 136  K 3.9 3.0* 3.1* 3.3*  CL 90* 94* 96* 101  CO2 20* 20* 17* 25  GLUCOSE 98 77 99 86  BUN 25* 30* 40* 11  CREATININE 6.47* 7.03* 8.74* 4.51*  CALCIUM 9.9 10.1 9.1 8.1*  AST 74*  --   --   --   ALT 32  --   --   --   ALKPHOS 87  --   --   --   BILITOT 1.4*  --   --   --    ------------------------------------------------------------------------------------------------------------------ No results for input(s): CHOL, HDL, LDLCALC, TRIG, CHOLHDL, LDLDIRECT in the last 72 hours.  No results found for: HGBA1C ------------------------------------------------------------------------------------------------------------------ No results for input(s): TSH, T4TOTAL, T3FREE, THYROIDAB in the last 72 hours.  Invalid input(s):  FREET3 ------------------------------------------------------------------------------------------------------------------ No results for input(s): VITAMINB12, FOLATE, FERRITIN, TIBC, IRON, RETICCTPCT in the last 72 hours.  Coagulation profile No results for input(s): INR, PROTIME in the last 168 hours.  No results for input(s): DDIMER in the last 72 hours.  Cardiac Enzymes  Recent Labs Lab 08/15/15 1525 08/15/15 1854 08/15/15 2100  TROPONINI 0.18* 0.15* 0.18*   ------------------------------------------------------------------------------------------------------------------ No results found for: BNP  Inpatient Medications  Scheduled Meds: . feeding supplement (NEPRO CARB STEADY)  237 mL Oral BID BM  . lactulose  30 g Oral TID  . levothyroxine  50 mcg Oral QAC breakfast  . midodrine  10 mg Oral 2 times per day  . multivitamin  1 tablet Oral QHS  . pantoprazole  40 mg Oral BID AC   Continuous Infusions:  PRN Meds:.acetaminophen, acetaminophen, gi cocktail, HYDROcodone-acetaminophen, morphine injection, ondansetron (ZOFRAN) IV  Micro Results Recent Results (from the past 240 hour(s))  Culture, blood (Routine X 2) w Reflex to ID Panel     Status: None (Preliminary result)   Collection Time: 08/15/15  2:10 AM  Result Value Ref Range Status   Specimen Description BLOOD RIGHT HAND  Final   Special Requests BOTTLES  DRAWN AEROBIC ONLY 5CC  Final   Culture NO GROWTH 2 DAYS  Final   Report Status PENDING  Incomplete  MRSA PCR Screening     Status: None   Collection Time: 08/15/15  2:12 AM  Result Value Ref Range Status   MRSA by PCR NEGATIVE NEGATIVE Final    Comment:        The GeneXpert MRSA Assay (FDA approved for NASAL specimens only), is one component of a comprehensive MRSA colonization surveillance program. It is not intended to diagnose MRSA infection nor to guide or monitor treatment for MRSA infections.   Culture, blood (Routine X 2) w Reflex to ID Panel      Status: None (Preliminary result)   Collection Time: 08/15/15  5:40 AM  Result Value Ref Range Status   Specimen Description BLOOD RIGHT HAND  Final   Special Requests IN PEDIATRIC BOTTLE 1CC  Final   Culture NO GROWTH 2 DAYS  Final   Report Status PENDING  Incomplete    Radiology Reports Dg Chest 2 View  08/14/2015  CLINICAL DATA:  Hypotensive.  Diaphysis yesterday EXAM: CHEST  2 VIEW COMPARISON:  06/14/2015 FINDINGS: Right chest wall dialysis catheter is noted with tips in the right atrium. Mild cardiac enlargement. There is mild pulmonary edema. No pleural fluid identified. No airspace consolidation. Previous bilateral shoulder arthroplasty. IMPRESSION: Mild cardiac enlargement and mild interstitial edema. Electronically Signed   By: Signa Kell M.D.   On: 08/14/2015 19:02   Ct Head Wo Contrast  08/14/2015  CLINICAL DATA:  Headache, dizziness. EXAM: CT HEAD WITHOUT CONTRAST TECHNIQUE: Contiguous axial images were obtained from the base of the skull through the vertex without intravenous contrast. COMPARISON:  CT scan of June 14, 2015. FINDINGS: Bony calvarium appears intact. Mild diffuse cortical atrophy is noted. Mild chronic ischemic white matter disease is noted. No mass effect or midline shift is noted. Ventricular size is within normal limits. There is no evidence of mass lesion, hemorrhage or acute infarction. IMPRESSION: Mild diffuse cortical atrophy. Mild chronic ischemic white matter disease. No acute intracranial abnormality seen. Electronically Signed   By: Lupita Raider, M.D.   On: 08/14/2015 20:51   Mr Brain Wo Contrast  08/17/2015  CLINICAL DATA:  Slow rate of speech and dizziness beginning a couple days ago. Personal history of end-stage renal disease and dialysis. EXAM: MRI HEAD WITHOUT CONTRAST TECHNIQUE: Multiplanar, multiecho pulse sequences of the brain and surrounding structures were obtained without intravenous contrast. COMPARISON:  CT head without contrast  08/14/2015. FINDINGS: The diffusion-weighted images demonstrate no evidence for acute or subacute infarction. Severe diffuse cerebral and cerebellar atrophy is noted. There is mild periventricular white matter changes well. The ventricles are proportionate to the degree of atrophy. No significant extra-axial fluid collection is present. There is a remote lacunar infarct of the right cerebellum. The internal auditory canals are within normal limits bilaterally. The brainstem is unremarkable. Flow is present in the major intracranial arteries. Bilateral lens replacements are present. The globes and orbits are intact. The paranasal sinuses and mastoid air cells are clear. The skullbase is within normal limits. Midline sagittal images of the intracranial structures are unremarkable. Multilevel degenerate changes are present within the cervical spine. IMPRESSION: 1. No acute intracranial abnormality. 2. Severe diffuse cerebral and cerebellar atrophy with mild white matter disease. This likely reflects the sequela of chronic microvascular ischemia. 3. Remote lacunar infarct of the right cerebellum. 4. Multilevel degenerative changes in the cervical spine. Electronically Signed  By: Marin Kelleher M.D.   On: 08/17/2015 12:10   US Pelvis Complete  08/15/2015  CLINICAL DATA:  Vaginal bleeding EXAM: TRANSABDOMINAL ULTRASOUND OF PELVIS TECHNIQUE: Transabdominal ultrasound examination of the pelvis was performed including evaluation of the uterus, ovaries, adnexal regions, and pelvic cul-de-sac. COMPARISON:  None. FINDINGS: Uterus Measurements: 8.3 x 4.2 x 5.6 cm. No fibroids or other mass visualized. Endometrium Thickness: 16.7 mm.  Heterogeneous in appearance. Right ovary Measurements: 2.2 x 1.3 x 2.0 cm. Normal appearance/no adnexal mass. Left ovary Measurements: 3.4 x 2.2 x 2.5 cm. Normal appearance/no adnexal mass. Other findings:  No abnormal free fluid. IMPRESSION: 1. The endometrium is thickened measuring 16.7  mm. In the setting of post-menopausal bleeding, endometrial sampling is indicated to exclude carcinoma. If results are benign, sonohysterogram should be considered for focal lesion work-up. (Ref: Radiological Reasoning: Algorithmic Workup of Abnormal Vaginal Bleeding with Endovaginal Sonography and Sonohysterography. AJR 2008; 161:W96-04) Electronically Signed   By: Signa Kell M.D.   On: 08/15/2015 15:49    Time Spent in minutes  25 Minutes   Jeremias Broyhill M.D on 08/17/2015 at 3:59 PM  Between 7am to 7pm - Pager - [phone]  After 7pm go to [email or web address] - password [email or web address]  [facility] -  Office  [phone]

## 2015-08-17 NOTE — Progress Notes (Signed)
Echocardiogram 2D Echocardiogram has been performed.  Samantha [name], Samantha [name] M 08/17/2015, 1:41 PM

## 2015-08-17 NOTE — Progress Notes (Signed)
  [facility] Progress Note   Subjective: BP's responding to IVF boluses.  No compalitns.   Filed Vitals:   08/17/15 0900 08/17/15 1000 08/17/15 1205 08/17/15 1400  BP: 102/57 91/62 77/66  105/69  Pulse: 73 117 82   Temp:   98.6 F (37 C)   TempSrc:   Oral   Resp: 13 16 13 18   Height:      Weight:      SpO2: 100% 100% 100%     Inpatient medications: . feeding supplement (NEPRO CARB STEADY)  237 mL Oral BID BM  . lactulose  30 g Oral TID  . levothyroxine  50 mcg Oral QAC breakfast  . midodrine  10 mg Oral 2 times per day  . multivitamin  1 tablet Oral QHS  . pantoprazole  40 mg Oral BID AC     acetaminophen, acetaminophen, gi cocktail, HYDROcodone-acetaminophen, morphine injection, ondansetron (ZOFRAN) IV  Exam: Chron ill appearing, difficult to understand and very HOH, this is baseline No jvd Chest clear bilat RRR no mrg Abd soft ntnd no ascite No LE edema R IJ perm cath Neuro alert, responds to questions, affect normal  Dialysis: [facility] MWF  4h  F160  75kg   2/ 2.5 bath  P4  IJ cath  Heparin none Hect 2 ug      Assessment: 1 Hypotension - prob vol depleted, will try higher dry weight, add 3-4 kg.  Random cortisol 8, will order stim test.   2 Debility for SNFP 3 ESRD HD MWF 4 Abd pain , chron issue 5 MBD stable 6 Vag bleeding - none here  Plan - HD Wed, SNFP, ^dry wt, stim test   Vinson Moselleob Kleigh Hoelzer MD [facility] pager [phone]    cell [phone] 08/17/2015, 4:32 PM    Recent Labs Lab 08/15/15 0540 08/16/15 0245 08/17/15 0245  NA 131* 130* 136  K 3.0* 3.1* 3.3*  CL 94* 96* 101  CO2 20* 17* 25  GLUCOSE 77 99 86  BUN 30* 40* 11  CREATININE 7.03* 8.74* 4.51*  CALCIUM 10.1 9.1 8.1*  PHOS 6.6*  --   --     Recent Labs Lab 08/14/15 1805 08/15/15 0540  AST 74*  --   ALT 32  --   ALKPHOS 87  --   BILITOT 1.4*  --   PROT 7.6  --   ALBUMIN 3.0* 3.2*    Recent Labs Lab 08/14/15 1805 08/15/15 0541 08/16/15 0245  08/17/15 0245  WBC 3.7* 2.5* 2.4* 1.9*  NEUTROABS 1.5* 1.2*  --   --   HGB 11.4* 13.0 10.6* 9.8*  HCT 34.9* 39.1 30.5* 28.7*  MCV 99.1 98.2 95.9 95.3  PLT 71* 38* 46* 57*

## 2015-08-18 LAB — CBC
HCT: 29.3 % — ABNORMAL LOW (ref 36.0–46.0)
HEMOGLOBIN: 10 g/dL — AB (ref 12.0–15.0)
MCH: 33.1 pg (ref 26.0–34.0)
MCHC: 34.1 g/dL (ref 30.0–36.0)
MCV: 97 fL (ref 78.0–100.0)
Platelets: 61 10*3/uL — ABNORMAL LOW (ref 150–400)
RBC: 3.02 MIL/uL — AB (ref 3.87–5.11)
RDW: 16.2 % — ABNORMAL HIGH (ref 11.5–15.5)
WBC: 2.6 10*3/uL — ABNORMAL LOW (ref 4.0–10.5)

## 2015-08-18 LAB — ACTH STIMULATION, 3 TIME POINTS
CORTISOL 30 MIN: 20.1 ug/dL
CORTISOL BASE: 11.3 ug/dL
Cortisol, 60 Min: 24.5 ug/dL

## 2015-08-18 LAB — RENAL FUNCTION PANEL
ALBUMIN: 2.3 g/dL — AB (ref 3.5–5.0)
ANION GAP: 10 (ref 5–15)
BUN: 19 mg/dL (ref 6–20)
CALCIUM: 9 mg/dL (ref 8.9–10.3)
CO2: 21 mmol/L — AB (ref 22–32)
Chloride: 102 mmol/L (ref 101–111)
Creatinine, Ser: 7.21 mg/dL — ABNORMAL HIGH (ref 0.44–1.00)
GFR calc non Af Amer: 5 mL/min — ABNORMAL LOW (ref >60)
GFR, EST AFRICAN AMERICAN: 6 mL/min — AB (ref >60)
Glucose, Bld: 99 mg/dL (ref 65–99)
Phosphorus: 5 mg/dL — ABNORMAL HIGH (ref 2.5–4.6)
Potassium: 3.5 mmol/L (ref 3.5–5.1)
Sodium: 133 mmol/L — ABNORMAL LOW (ref 135–145)

## 2015-08-18 MED ORDER — LIDOCAINE HCL (PF) 1 % IJ SOLN: 5.0000 mL | INTRAMUSCULAR | Status: DC | PRN

## 2015-08-18 MED ORDER — LIDOCAINE-PRILOCAINE 2.5-2.5 % EX CREA: 1.0000 "application " | TOPICAL_CREAM | CUTANEOUS | Status: DC | PRN

## 2015-08-18 MED ORDER — PENTAFLUOROPROP-TETRAFLUOROETH EX AERO: 1.0000 "application " | INHALATION_SPRAY | CUTANEOUS | Status: DC | PRN

## 2015-08-18 MED ORDER — SODIUM CHLORIDE 0.9 % IV SOLN: 100.0000 mL | INTRAVENOUS | Status: DC | PRN

## 2015-08-18 MED ORDER — HEPARIN SODIUM (PORCINE) 1000 UNIT/ML DIALYSIS: 1000.0000 [IU] | INTRAMUSCULAR | Status: DC | PRN

## 2015-08-18 MED ORDER — ALTEPLASE 2 MG IJ SOLR: 2.0000 mg | Freq: Once | INTRAMUSCULAR | Status: DC | PRN

## 2015-08-18 NOTE — Progress Notes (Signed)
PROGRESS NOTE  Samantha [name] ZOX:[id number] DOB: 05/26/1945 DOA: 08/14/2015 PCP: Dorrene GermanEdwin A Avbuere, MD Outpatient Specialists:    LOS: 1 day   Brief Narrative: 70 y.o. female with medical history significant of ESRD on HD(M/W/F), hypothyroidism, hypotension, liver cirrhosis, osteoarthritis, HTN, anemia, dizziness; who presents with complaints of abdominal pain and dizziness, Workup was significant for elevated troponin and hypotension, blood pressure in ED 62/43, Hypotension resolved with fluid bolus as well as increased dry weight, seen by cardiology for elevated troponin, thought to be secondary to demand ischemia from hypotension. She also had vaginal spotting, US pelvis with increased endometrial thickness, to follow with GYN as an outpatient for endometrial sampling.  Assessment & Plan: Principal Problem:   Hypotension Active Problems:   Anemia in chronic kidney disease (CKD)   Abdominal pain   Hyponatremia   Thrombocytopenia (HCC)   Cirrhosis of liver (HCC)   ESRD on dialysis (HCC)   Vaginal bleeding   Hypotension with dizziness  - Presented with low blood pressure in ED 62/43, patient with chronic hypotension at baseline but this was worse - resolved with IV fluid bolus  - Patient does not appear to be septic, lactic acid 1.7 on admission, afebrile, no leukocytosis, blood cultures remain negative, stopped broad-spectrum IV antibiotics 5/1 - Follow on blood cultures , remains negative to date - 2-D echo with normal EF 55-60%, grade 1 DD - am cortisol 8.8, stim done today with appropriate response 11.3 >> 20.1 >> 24.5 - Midodrine has been increased from 10 mg daily on dialysis days, to 10 mg twice a day.  Elevated troponin  - Cardiology consult appreciated, his is most likely in the setting of demand ischemia, 2D echo normal, no further workup  Abdominal pain  - Patient with known history of chronic abdominal pain, benign physical exam abdominal exam since admission, denies  any further abdominal pain - Lipase elevated at 141, but by reviewing previous records it was consistently elevated  - improving today   Vaginal spotting - ultrasound pelvis with significant endometrial thickening, will need endometrial sampling as an outpatient, Dr. Randol KernElgergawy discussed with Dr. Adrian BlackwaterStinson, they will arrange for outpatient follow-up, and this was conveyed to granddaughter who understand the importance of outpatient follow-up, actually she is telling me they are rescheduling her previous appointment(as she missed outpatient follow-up with GYN service secondary to dialysis).  ESRD - Renal consulted, on hemodialysis Monday Wednesday Friday  Cirrhosis - Patient with just a mildly elevated ammonia level 54 - will increase lactulose to 3 times a day, continue, alert today   Encephalopathy - Patient is delayed response and sluggish speech, she is extremely hard of hearing, slightly worse than her baseline as per granddaughter, so MRI brain has been obtained, no evidence of acute infarct, CT head with no acute finding, had mildly elevated ammonia level, so lactulose has been increased ammonia 54> 40.  Diastolic CHF - Monitor closely as she required multiple boluses before soft blood pressure - Follow-up echo as above - Volume management with dialysis  Anemia of chronic renal Disease -Hemoglobin of 11.4 on admission - Continue to monitor   Thrombocytopenia (HCC): -Likely due to cirrhosis. Platelet 71 on admission, no need for transfusion, will use SCD for DVT prophylaxis  - platelets 61 this morning  Hypothyroidism - continue Levothyroxine    DVT prophylaxis: SCD Code Status: Full Family Communication: no family at bedside Disposition Plan: SNF when ready Barriers for discharge: mental status, hypotension   Consultants:   Nephrology  Cardiology  Procedures:   2D echo: EF 55-60%, grade 1 DD  Antimicrobials:  Vancomycin / Zosyn 4/30 >>  4/30  Subjective: - eating breakfast, very HOH, complains of her oatmeal not having enough sugar. Appears confused.  Objective: Filed Vitals:   08/18/15 0700 08/18/15 0805 08/18/15 1100 08/18/15 1141  BP: 97/67  106/64   Pulse:      Temp:  98.5 F (36.9 C)  97.6 F (36.4 C)  TempSrc:  Oral  Oral  Resp: 15  17   Height:      Weight:      SpO2:  100%  100%    Intake/Output Summary (Last 24 hours) at 08/18/15 1204 Last data filed at 08/18/15 1100  Gross per 24 hour  Intake    919 ml  Output      6 ml  Net    913 ml   Filed Weights   08/16/15 1446 08/16/15 1849 08/17/15 0400  Weight: 78.5 kg (173 lb 1 oz) 79.4 kg (175 lb 0.7 oz) 78.8 kg (173 lb 11.6 oz)    Examination: Constitutional: NAD, calm, comfortable Filed Vitals:   08/18/15 0700 08/18/15 0805 08/18/15 1100 08/18/15 1141  BP: 97/67  106/64   Pulse:      Temp:  98.5 F (36.9 C)  97.6 F (36.4 C)  TempSrc:  Oral  Oral  Resp: 15  17   Height:      Weight:      SpO2:  100%  100%   Neck: normal, supple Respiratory: clear to auscultation bilaterally, no wheezing, no crackles. Normal respiratory effort. No accessory muscle use.  Cardiovascular: Regular rate and rhythm, no murmurs / rubs / gallops. No extremity edema. 2+ pedal pulses. No carotid bruits.  Abdomen: no tenderness. Bowel sounds positive.  Musculoskeletal: no clubbing / cyanosis.  Neurologic: CN 2-12 grossly intact. Strength 5/5 in all 4.  Psychiatric: Normal judgment and insight. Normal mood.    Data Reviewed: I have personally reviewed following labs and imaging studies  CBC:  Recent Labs Lab 08/14/15 1805 08/15/15 0541 08/16/15 0245 08/17/15 0245 08/18/15 0759  WBC 3.7* 2.5* 2.4* 1.9* 2.6*  NEUTROABS 1.5* 1.2*  --   --   --   HGB 11.4* 13.0 10.6* 9.8* 10.0*  HCT 34.9* 39.1 30.5* 28.7* 29.3*  MCV 99.1 98.2 95.9 95.3 97.0  PLT 71* 38* 46* 57* 61*   Basic Metabolic Panel:  Recent Labs Lab 08/14/15 1805 08/15/15 0540 08/16/15 0245  08/17/15 0245 08/18/15 0751  NA 127* 131* 130* 136 133*  K 3.9 3.0* 3.1* 3.3* 3.5  CL 90* 94* 96* 101 102  CO2 20* 20* 17* 25 21*  GLUCOSE 98 77 99 86 99  BUN 25* 30* 40* 11 19  CREATININE 6.47* 7.03* 8.74* 4.51* 7.21*  CALCIUM 9.9 10.1 9.1 8.1* 9.0  PHOS  --  6.6*  --   --  5.0*   GFR: Estimated Creatinine Clearance: 6.6 mL/min (by C-G formula based on Cr of 7.21). Liver Function Tests:  Recent Labs Lab 08/14/15 1805 08/15/15 0540 08/18/15 0751  AST 74*  --   --   ALT 32  --   --   ALKPHOS 87  --   --   BILITOT 1.4*  --   --   PROT 7.6  --   --   ALBUMIN 3.0* 3.2* 2.3*    Recent Labs Lab 08/14/15 1805  LIPASE 141*    Recent Labs Lab 08/15/15 0210 08/17/15 0245  AMMONIA 54*  40*   Coagulation Profile: No results for input(s): INR, PROTIME in the last 168 hours. Cardiac Enzymes:  Recent Labs Lab 08/14/15 1805 08/15/15 1525 08/15/15 1854 08/15/15 2100  TROPONINI 0.21* 0.18* 0.15* 0.18*   BNP (last 3 results) No results for input(s): PROBNP in the last 8760 hours. HbA1C: No results for input(s): HGBA1C in the last 72 hours. CBG:  Recent Labs Lab 08/14/15 1942  GLUCAP 80   Lipid Profile: No results for input(s): CHOL, HDL, LDLCALC, TRIG, CHOLHDL, LDLDIRECT in the last 72 hours. Thyroid Function Tests: No results for input(s): TSH, T4TOTAL, FREET4, T3FREE, THYROIDAB in the last 72 hours. Anemia Panel: No results for input(s): VITAMINB12, FOLATE, FERRITIN, TIBC, IRON, RETICCTPCT in the last 72 hours. Urine analysis:    Component Value Date/Time   COLORURINE AMBER* 08/03/2014 1249   APPEARANCEUR CLOUDY* 08/03/2014 1249   LABSPEC 1.020 08/03/2014 1249   PHURINE 7.0 08/03/2014 1249   GLUCOSEU 500* 08/03/2014 1249   HGBUR LARGE* 08/03/2014 1249   BILIRUBINUR MODERATE* 08/03/2014 1249   KETONESUR 15* 08/03/2014 1249   PROTEINUR >300* 08/03/2014 1249   UROBILINOGEN 4.0* 08/03/2014 1249   NITRITE POSITIVE* 08/03/2014 1249   LEUKOCYTESUR TRACE*  08/03/2014 1249   Sepsis Labs: Invalid input(s): PROCALCITONIN, LACTICIDVEN  Recent Results (from the past 240 hour(s))  Culture, blood (Routine X 2) w Reflex to ID Panel     Status: None (Preliminary result)   Collection Time: 08/15/15  2:10 AM  Result Value Ref Range Status   Specimen Description BLOOD RIGHT HAND  Final   Special Requests BOTTLES DRAWN AEROBIC ONLY 5CC  Final   Culture NO GROWTH 2 DAYS  Final   Report Status PENDING  Incomplete  MRSA PCR Screening     Status: None   Collection Time: 08/15/15  2:12 AM  Result Value Ref Range Status   MRSA by PCR NEGATIVE NEGATIVE Final    Comment:        The GeneXpert MRSA Assay (FDA approved for NASAL specimens only), is one component of a comprehensive MRSA colonization surveillance program. It is not intended to diagnose MRSA infection nor to guide or monitor treatment for MRSA infections.   Culture, blood (Routine X 2) w Reflex to ID Panel     Status: None (Preliminary result)   Collection Time: 08/15/15  5:40 AM  Result Value Ref Range Status   Specimen Description BLOOD RIGHT HAND  Final   Special Requests IN PEDIATRIC BOTTLE 1CC  Final   Culture NO GROWTH 2 DAYS  Final   Report Status PENDING  Incomplete      Radiology Studies: Mr Brain Wo Contrast  08/17/2015  CLINICAL DATA:  Slow rate of speech and dizziness beginning a couple days ago. Personal history of end-stage renal disease and dialysis. EXAM: MRI HEAD WITHOUT CONTRAST TECHNIQUE: Multiplanar, multiecho pulse sequences of the brain and surrounding structures were obtained without intravenous contrast. COMPARISON:  CT head without contrast 08/14/2015. FINDINGS: The diffusion-weighted images demonstrate no evidence for acute or subacute infarction. Severe diffuse cerebral and cerebellar atrophy is noted. There is mild periventricular white matter changes well. The ventricles are proportionate to the degree of atrophy. No significant extra-axial fluid collection is  present. There is a remote lacunar infarct of the right cerebellum. The internal auditory canals are within normal limits bilaterally. The brainstem is unremarkable. Flow is present in the major intracranial arteries. Bilateral lens replacements are present. The globes and orbits are intact. The paranasal sinuses and mastoid air cells are  clear. The skullbase is within normal limits. Midline sagittal images of the intracranial structures are unremarkable. Multilevel degenerate changes are present within the cervical spine. IMPRESSION: 1. No acute intracranial abnormality. 2. Severe diffuse cerebral and cerebellar atrophy with mild white matter disease. This likely reflects the sequela of chronic microvascular ischemia. 3. Remote lacunar infarct of the right cerebellum. 4. Multilevel degenerative changes in the cervical spine. Electronically Signed   By: Marin Scriven M.D.   On: 08/17/2015 12:10     Scheduled Meds: . feeding supplement (NEPRO CARB STEADY)  237 mL Oral BID BM  . lactulose  30 g Oral TID  . levothyroxine  50 mcg Oral QAC breakfast  . midodrine  10 mg Oral 2 times per day  . multivitamin  1 tablet Oral QHS  . pantoprazole  40 mg Oral BID AC   Continuous Infusions:   Pamella Pert, MD, PhD [facility] Pager [phone]  If 7PM-7AM, please contact night-coverage [email or web address] Password [email or web address] 08/18/2015, 12:04 PM

## 2015-08-18 NOTE — Clinical Social Work Placement (Addendum)
   CLINICAL SOCIAL WORK PLACEMENT  NOTE  Date:  08/18/2015  Patient Details  Name: Samantha [name] MRN: [id number] Date of Birth: 1945-08-11  Clinical Social Work is seeking post-discharge placement for this patient at the Skilled  Nursing Facility level of care (*CSW will initial, date and re-position this form in  chart as items are completed):  Yes   Patient/family provided with [place] Clinical Social Work Department's list of facilities offering this level of care within the geographic area requested by the patient (or if unable, by the patient's family).  Yes   Patient/family informed of their freedom to choose among providers that offer the needed level of care, that participate in Medicare, Medicaid or managed care program needed by the patient, have an available bed and are willing to accept the patient.  Yes   Patient/family informed of [facility]'s ownership interest in [facility] and [facility], as well as of the fact that they are under no obligation to receive care at these facilities.  PASRR submitted to EDS on 08/18/15     PASRR number received on 08/18/15     Existing PASRR number confirmed on       FL2 transmitted to all facilities in geographic area requested by pt/family on 08/17/15     FL2 transmitted to all facilities within larger geographic area on       Patient informed that his/her managed care company has contracts with or will negotiate with certain facilities, including the following:        Yes   Patient/family informed of bed offers received.  Patient chooses bed at [facility]     Physician recommends and patient chooses bed at      Patient to be transferred to  [facility] on   08/20/15 (Updated Windell MouldingEric Turkessa Ostrom, MSW, LCSWA, 08/20/15)   Patient to be transferred to facility by  [facility]  (Updated Windell MouldingEric Deveion Denz, MSW, LCSWA, 08/20/15)    Patient family notified on  08-20-15 of transfer.  (Updated Windell MouldingEric Matalynn Graff, MSW, LCSWA,  08/20/15)  Name of family member notified:     Wray KearnsWashington,[place] Grandaughter [phone]       PHYSICIAN Please sign FL2     Additional Comment:    _______________________________________________ Darleene CleaverAnterhaus, Ruperto Kiernan R, LCSWA 08/18/2015, 5:45 PM

## 2015-08-18 NOTE — Progress Notes (Signed)
Physical Therapy Treatment Patient Details Name: Samantha [name] M Crofford MRN: [id number] DOB: 08-18-1945 Today's Date: 08/18/2015    History of Present Illness Pt is a [age] y.o. female with medical history significant of ESRD on HD(M/W/F), hypothyroidism, hypotension, cirrhosis, Lt shoulder arthritis, hepatitis B, HTN, anemia, dizziness; who presents with complaints of abdominal pain and dizziness, Workup was significant for elevated troponin and hypotension, blood pressure in ED 62/43.     PT Comments    Ms. Su HiltRoberts made excellent progress today, ambulating 150 ft w/ occassional min assist to steady.  She may be appropriate to return home if she indeed has 24/7 assist.  Pt will benefit from continued skilled PT services to increase functional independence and safety.   Follow Up Recommendations  SNF;Supervision for mobility/OOB (may be able to return home if has 24/7 assist)     Equipment Recommendations  Rolling walker with 5" wheels    Recommendations for Other Services OT consult     Precautions / Restrictions Precautions Precautions: Fall Precaution Comments: watch BP Restrictions Weight Bearing Restrictions: No    Mobility  Bed Mobility Overal bed mobility: Needs Assistance Bed Mobility: Supine to Sit     Supine to sit: HOB elevated;Supervision     General bed mobility comments: Increased time and effort w/o use of bed rail this session.  HOB slightly elevated.  Transfers Overall transfer level: Needs assistance Equipment used: None Transfers: Sit to/from Stand Sit to Stand: Min assist Stand pivot transfers: Min assist       General transfer comment: Assist to steady w/ sit<>stand and stand pivot, pt reaching out for armrests when pivoting to [facility].    Ambulation/Gait Ambulation/Gait assistance: Min assist Ambulation Distance (Feet): 150 Feet Assistive device: None Gait Pattern/deviations: Step-through pattern;Decreased stride length   Gait velocity interpretation:  Below normal speed for age/gender General Gait Details: Min assist at times to steady.  VSS.   Stairs            Wheelchair Mobility    Modified Rankin (Stroke Patients Only)       Balance Overall balance assessment: Needs assistance Sitting-balance support: No upper extremity supported;Feet supported Sitting balance-Leahy Scale: Good     Standing balance support: No upper extremity supported;During functional activity Standing balance-Leahy Scale: Fair Standing balance comment: Standing balance w/o UE support w/ close min guard                    Cognition Arousal/Alertness: Awake/alert Behavior During Therapy: WFL for tasks assessed/performed Overall Cognitive Status: Difficult to assess                      Exercises      General Comments General comments (skin integrity, edema, etc.): BP supine at start of session: 105/63.  Sitting on BSC 105/78.      Pertinent Vitals/Pain Pain Assessment: Faces Faces Pain Scale: Hurts little more Pain Location: Lt arm when BP cuff inflating Pain Descriptors / Indicators: Aching;Grimacing Pain Intervention(s): Limited activity within patient's tolerance;Monitored during session    Home Living                      Prior Function            PT Goals (current goals can now be found in the care plan section) Acute Rehab PT Goals Patient Stated Goal: to walk PT Goal Formulation: With patient Time For Goal Achievement: 08/30/15 Potential to Achieve Goals: Good Progress towards PT  goals: Progressing toward goals    Frequency  Min 3X/week    PT Plan Current plan remains appropriate;Other (comment) (BP)    Co-evaluation             End of Session Equipment Utilized During Treatment: Gait belt Activity Tolerance: Patient limited by fatigue;Patient tolerated treatment well Patient left: with call bell/phone within reach;in chair;with chair alarm set     Time: 6962-9528 PT Time  Calculation (min) (ACUTE ONLY): 24 min  Charges:  $Gait Training: 8-22 mins $Therapeutic Activity: 8-22 mins                    G Codes:      Encarnacion Chu PT, DPT  Pager: [phone] Phone: [phone] 08/18/2015, 2:54 PM

## 2015-08-18 NOTE — Procedures (Signed)
Patient is doing good, her BP's are up, we are raising dry wt several kg to 80kg.  OK for dc from renal standpoint.   I was present at this dialysis session, have reviewed the session itself and made  Appropriate.  changes Vinson Moselleob Chapman Matteucci MD [facility] pager [phone]    cell [phone] 08/18/2015, 2:26 PM

## 2015-08-18 NOTE — Clinical Social Work Note (Signed)
CSW attempted to call patient's grandaughter to discuss SNF placement at [facility], CSW was not able to leave a message for patient.  Awaiting call back from patient's grandaughter.  Ervin KnackEric R. Frank Pilger, MSW, Theresia MajorsLCSWA [phone] 08/18/2015 5:46 PM

## 2015-08-18 NOTE — Clinical Social Work Note (Signed)
Clinical Social Work Assessment  Patient Details  Name: Samantha [name] MRN: [id number] Date of Birth: [birth year]  Date of referral:  08/17/15               Reason for consult:  Facility Placement                Permission sought to share information with:  Facility Medical sales representativeContact Representative Permission granted to share information::  Yes, Verbal Permission Granted  Name::     Wray KearnsWashington,[place] Grandaughter [phone]  Agency::  SNF admissions  Relationship::     Contact Information:     Housing/Transportation Living arrangements for the past 2 months:  Single Family Home Source of Information:  Other (Comment Required) (Patient's grandaughter) Patient Interpreter Needed:  None Criminal Activity/Legal Involvement Pertinent to Current Situation/Hospitalization:  No - Comment as needed Significant Relationships:  Other Family Members Lives with:  Relatives Do you feel safe going back to the place where you live?  No (Patient feels safe to return back home once she has received some rehab.) Need for family participation in patient care:  Yes (Comment) (Patient is very hard of hearing and asked to have CSW speak with her grandkids.)  Care giving concerns: Patient lives with her grandkids, but they are in agreement with patient having SNF rehab first.   Social Worker assessment / plan: Patient is a 70 year old female who lives with her grandkids who help take care of her.  Patient is alert and oriented x4, but hard of hearing, patient requests CSW to speak with her grandkids.  Patient's grandaughter Sandrea HughsShamonica [phone] is the main contact and assessment was completed by speaking to her.  Patient's grandkids stated they help take care of her and provide support.  Patient has been to rehab in the past, and would [facility] if a bed is available.  Patient's grandkids expressed they did not have any other questions.    Employment status:  Retired Health and safety inspectornsurance information:   Armed forces operational officerMedicare, Medicaid [place] PT Recommendations:  Skilled Nursing Facility Information / Referral to community resources:  Skilled Nursing Facility  Patient/Family's Response to care:  Patient in agreement to going to SNF for short term rehab.  Patient/Family's Understanding of and Emotional Response to Diagnosis, Current Treatment, and Prognosis:  Patient and family aware of current treatment and diagnosis.  Emotional Assessment Appearance:  Appears stated age Attitude/Demeanor/Rapport:    Affect (typically observed):  Pleasant, Appropriate Orientation:  Oriented to Self, Oriented to Place, Oriented to  Time, Oriented to Situation Alcohol / Substance use:  Not Applicable Psych involvement (Current and /or in the community):  No (Comment)  Discharge Needs  Concerns to be addressed:  No discharge needs identified Readmission within the last 30 days:  No Current discharge risk:  None Barriers to Discharge:  Continued Medical Work up   [place] [employer], Kollen Armenti R, LCSWA 08/18/2015, 5:23 PM

## 2015-08-18 NOTE — [place] FL2 (Signed)
[place] MEDICAID FL2 LEVEL OF CARE SCREENING TOOL     IDENTIFICATION  Patient Name: Samantha [name] Birthdate: 03-03-1946 Sex: female Admission Date (Current Location): 08/14/2015  [place] and [place] Number:  Haynes Bast [id number] R Facility and Address:   [facility]      Provider Number: [id number]  Attending Physician Name and Address:  Leatha Gilding, MD  Relative Name and Phone Number:  [facility] - [phone]    Current Level of Care: Hospital Recommended Level of Care: Skilled Nursing Facility Prior Approval Number:    Date Approved/Denied:   PASRR Number: [id number]  Discharge Plan: SNF    Current Diagnoses: Patient Active Problem List   Diagnosis Date Noted  . Vaginal bleeding 08/15/2015  . Acute GI bleeding   . Adnexal mass   . ESRD on dialysis (HCC)   . Lactic acidosis   . Neutropenia (HCC) 06/15/2015  . Acute encephalopathy 06/14/2015  . Sepsis (HCC) 06/14/2015  . 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 Arteriovenous graft infection (HCC)   . Gram-negative bacteremia (HCC)   . Cirrhosis of liver (HCC) 12/26/2014  . Thrombocytopenia (HCC) 12/25/2014  . 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 Cardiomegaly 12/25/2014  . Bacteremia 12/25/2014  . Cellulitis of left upper extremity   . Mechanical complication of other vascular device, implant, and graft 10/08/2013  . End stage renal disease (HCC) 01/16/2013  . Hyponatremia 04/12/2012  . Pancreatitis 04/10/2012  . Hypotension 04/09/2012  . SOB (shortness of breath) 04/08/2012  . Abdominal pain 04/08/2012  . Symptomatic cholelithiasis 11/16/2011  . Acute blood loss anemia 06/08/2011  . Transaminitis 06/08/2011  . Hepatitis B antibody positive 06/08/2011  . GI bleed 06/06/2011  . ESRD on hemodialysis (HCC) 06/06/2011  . HTN (hypertension) 06/06/2011  . Shoulder pain 06/06/2011  . RUQ abdominal tenderness 06/06/2011  . Anemia in chronic  kidney disease (CKD) 06/06/2011    Orientation RESPIRATION BLADDER Height & Weight     Self, Time, Situation, Place  Normal Continent Weight: 173 lb 11.6 oz (78.8 kg) Height:  5' (152.4 cm)  BEHAVIORAL SYMPTOMS/MOOD NEUROLOGICAL BOWEL NUTRITION STATUS      Continent Diet (Cardiac)  AMBULATORY STATUS COMMUNICATION OF NEEDS Skin   Limited Assist Verbally Normal                       Personal Care Assistance Level of Assistance  Bathing, Dressing Bathing Assistance: Limited assistance   Dressing Assistance: Limited assistance     Functional Limitations Info  Hearing, Speech, Sight Sight Info: Adequate Hearing Info: Impaired Speech Info: Adequate    SPECIAL CARE FACTORS FREQUENCY  PT (By licensed PT)     PT Frequency: 5x a week              Contractures Contractures Info: Not present    Additional Factors Info  Code Status, Allergies Code Status Info: Full  Allergies Info: BANANA, CHOCOLATE, FISH-DERIVED PRODUCTS           Current Medications (08/18/2015):  This is the current hospital active medication list Current Facility-Administered Medications  Medication Dose Route Frequency Provider Last Rate Last Dose  . acetaminophen (TYLENOL) tablet 500 mg  500 mg Oral Q6H PRN Clydie Braun, MD      . acetaminophen (TYLENOL) tablet 650 mg  650 mg Oral Q4H PRN Rondell A Katrinka Blazing, MD      . feeding supplement (NEPRO CARB STEADY) liquid 237 mL  237 mL  Oral BID BM Pola Cornita Harbison Brown, NP 237 mL/hr at 08/17/15 1413 237 mL at 08/18/15 1018  . gi cocktail (Maalox,Lidocaine,Donnatal)  30 mL Oral QID PRN Clydie Braunondell A Smith, MD      . HYDROcodone-acetaminophen (NORCO/VICODIN) 5-325 MG per tablet 1 tablet  1 tablet Oral Q6H PRN Clydie Braunondell A Smith, MD   1 tablet at 08/16/15 1014  . lactulose (CHRONULAC) 10 GM/15ML solution 30 g  30 g Oral TID Starleen Armsawood S Elgergawy, MD   Stopped at 08/18/15 1015  . levothyroxine (SYNTHROID, LEVOTHROID) tablet 50 mcg  50 mcg Oral QAC breakfast Clydie Braunondell A  Smith, MD   50 mcg at 08/18/15 1010  . midodrine (PROAMATINE) tablet 10 mg  10 mg Oral 2 times per day Pola Cornita Harbison Brown, NP   10 mg at 08/17/15 1818  . morphine 2 MG/ML injection 2 mg  2 mg Intravenous Q2H PRN Clydie Braunondell A Smith, MD      . multivitamin (RENA-VIT) tablet 1 tablet  1 tablet Oral QHS Pola Cornita Harbison Brown, NP   1 tablet at 08/17/15 2116  . ondansetron (ZOFRAN) injection 4 mg  4 mg Intravenous Q6H PRN Clydie Braunondell A Smith, MD      . pantoprazole (PROTONIX) EC tablet 40 mg  40 mg Oral BID AC Rondell Burtis JunesA Smith, MD   40 mg at 08/18/15 1010     Discharge Medications: Please see discharge summary for a list of discharge medications.  Relevant Imaging Results:  Relevant Lab Results:   Additional Information [id number].  Patient receives [facility] treatments [facility].  Zephyr Ridley, Ervin KnackEric R, LCSWA

## 2015-08-19 LAB — CBC
HCT: 26.3 % — ABNORMAL LOW (ref 36.0–46.0)
HEMOGLOBIN: 8.6 g/dL — AB (ref 12.0–15.0)
MCH: 32.5 pg (ref 26.0–34.0)
MCHC: 32.7 g/dL (ref 30.0–36.0)
MCV: 99.2 fL (ref 78.0–100.0)
PLATELETS: 51 10*3/uL — AB (ref 150–400)
RBC: 2.65 MIL/uL — AB (ref 3.87–5.11)
RDW: 16.1 % — ABNORMAL HIGH (ref 11.5–15.5)
WBC: 2 10*3/uL — AB (ref 4.0–10.5)

## 2015-08-19 LAB — RENAL FUNCTION PANEL
ANION GAP: 12 (ref 5–15)
Albumin: 2.2 g/dL — ABNORMAL LOW (ref 3.5–5.0)
BUN: 7 mg/dL (ref 6–20)
CALCIUM: 8.6 mg/dL — AB (ref 8.9–10.3)
CO2: 25 mmol/L (ref 22–32)
CREATININE: 4.14 mg/dL — AB (ref 0.44–1.00)
Chloride: 99 mmol/L — ABNORMAL LOW (ref 101–111)
GFR, EST AFRICAN AMERICAN: 12 mL/min — AB (ref >60)
GFR, EST NON AFRICAN AMERICAN: 10 mL/min — AB (ref >60)
Glucose, Bld: 100 mg/dL — ABNORMAL HIGH (ref 65–99)
Phosphorus: 2.5 mg/dL (ref 2.5–4.6)
Potassium: 3.3 mmol/L — ABNORMAL LOW (ref 3.5–5.1)
SODIUM: 136 mmol/L (ref 135–145)

## 2015-08-19 MED ORDER — SODIUM CHLORIDE 0.9 % IV BOLUS (SEPSIS)
500.0000 mL | INTRAVENOUS | Status: AC
  Administered 2015-08-19: 500 mL via INTRAVENOUS

## 2015-08-19 MED ORDER — MIDODRINE HCL 5 MG PO TABS
10.0000 mg | ORAL_TABLET | Freq: Three times a day (TID) | ORAL | Status: DC
  Administered 2015-08-19 – 2015-08-20 (×5): 10 mg via ORAL
  Filled 2015-08-19 (×5): qty 2

## 2015-08-19 MED ORDER — SODIUM CHLORIDE 0.9 % IV BOLUS (SEPSIS): 250.0000 mL | Freq: Once | INTRAVENOUS | Status: DC

## 2015-08-19 NOTE — Progress Notes (Signed)
MD paged regarding patient BP. Requested orders for parameters and order was received to notify MD if SBP < 90.

## 2015-08-19 NOTE — Progress Notes (Addendum)
PROGRESS NOTE  Samantha [name] ZOX:[id number] DOB: July 26, 1945 DOA: 08/14/2015 PCP: Dorrene German, MD Outpatient Specialists:    LOS: 2 days   Brief Narrative: 70 y.o. female with medical history significant of ESRD on HD(M/W/F), hypothyroidism, hypotension, liver cirrhosis, osteoarthritis, HTN, anemia, dizziness; who presents with complaints of abdominal pain and dizziness, Workup was significant for elevated troponin and hypotension, blood pressure in ED 62/43, Hypotension resolved with fluid bolus as well as increased dry weight, seen by cardiology for elevated troponin, thought to be secondary to demand ischemia from hypotension. She also had vaginal spotting, US pelvis with increased endometrial thickness, to follow with GYN as an outpatient for endometrial sampling.  Assessment & Plan: Principal Problem:   Hypotension Active Problems:   Anemia in chronic kidney disease (CKD)   Abdominal pain   Hyponatremia   Thrombocytopenia (HCC)   Cirrhosis of liver (HCC)   ESRD on dialysis (HCC)   Vaginal bleeding   Hypotension with dizziness  - Presented with low blood pressure in ED 62/43, patient with chronic hypotension at baseline but this was worse - received IV boluses occasionally with improvement - Patient does not appear to be septic, lactic acid 1.7 on admission, afebrile, no leukocytosis, blood cultures remain negative, stopped broad-spectrum IV antibiotics 5/1 - Follow on blood cultures , remains negative to date - 2-D echo with normal EF 55-60%, grade 1 DD - am cortisol 8.8, stim done 5/3 with appropriate response 11.3 >> 20.1 >> 24.5 - Midodrine has been increased from 10 mg daily on dialysis days, to daily  - BP this morning as low as 61/30, increase further the midodrine to TID. Not stable for discharge today   Elevated troponin  - Cardiology consult appreciated, his is most likely in the setting of demand ischemia, 2D echo normal, no further workup  Abdominal pain  -  Patient with known history of chronic abdominal pain, benign physical exam abdominal exam since admission, denies any further abdominal pain - Lipase elevated at 141, but by reviewing previous records it was consistently elevated  - pain almost completely resolved  Vaginal spotting - ultrasound pelvis with significant endometrial thickening, will need endometrial sampling as an outpatient, Dr. Randol Kern discussed with Dr. Adrian Blackwater, they will arrange for outpatient follow-up, and this was conveyed to granddaughter who understand the importance of outpatient follow-up, actually she is telling me they are rescheduling her previous appointment(as she missed outpatient follow-up with GYN service secondary to dialysis).  ESRD - Renal consulted, on hemodialysis Monday Wednesday Friday  Cirrhosis - Patient with just a mildly elevated ammonia level 54 - continue lactulose to 3 times a day, remains alert now  Encephalopathy - Patient is delayed response and sluggish speech, she is extremely hard of hearing, slightly worse than her baseline as per granddaughter, so MRI brain has been obtained, no evidence of acute infarct, CT head with no acute finding, had mildly elevated ammonia level, so lactulose has been increased ammonia 54> 40. - resolved  Diastolic CHF - Monitor closely as she required multiple boluses before soft blood pressure - Follow-up echo as above - Volume management with dialysis  Anemia of chronic renal Disease -Hemoglobin of 11.4 on admission - Continue to monitor   Thrombocytopenia (HCC): -Likely due to cirrhosis. Platelet 71 on admission, no need for transfusion, will use SCD for DVT prophylaxis  - platelets 51 this morning, fluctuating. No bleeding  Hypothyroidism - continue Levothyroxine    DVT prophylaxis: SCD Code Status: Full Family Communication: no family  at bedside, placed call to granddaughter, no answer Disposition Plan: SNF when ready Barriers for  discharge: hypotension   Consultants:   Nephrology  Cardiology   Procedures:   2D echo: EF 55-60%, grade 1 DD  Antimicrobials:  Vancomycin / Zosyn 4/30 >> 4/30  Subjective: - no complaints this morning, denies lightheadedness or dizziness.  Objective: Filed Vitals:   08/19/15 0800 08/19/15 0900 08/19/15 1000 08/19/15 1100  BP: 92/52 89/54 96/63  83/50  Pulse: 80     Temp: 97.5 F (36.4 C)     TempSrc: Oral     Resp: 19 19 18 16   Height:      Weight:      SpO2: 100%       Intake/Output Summary (Last 24 hours) at 08/19/15 1142 Last data filed at 08/19/15 1000  Gross per 24 hour  Intake   1329 ml  Output      2 ml  Net   1327 ml   Filed Weights   08/17/15 0400 08/18/15 1225 08/18/15 1609  Weight: 78.8 kg (173 lb 11.6 oz) 81.3 kg (179 lb 3.7 oz) 81.2 kg (179 lb 0.2 oz)    Examination: Constitutional: NAD, calm, comfortable Filed Vitals:   08/19/15 0800 08/19/15 0900 08/19/15 1000 08/19/15 1100  BP: 92/52 89/54 96/63  83/50  Pulse: 80     Temp: 97.5 F (36.4 C)     TempSrc: Oral     Resp: 19 19 18 16   Height:      Weight:      SpO2: 100%      Neck: normal, supple Respiratory: clear to auscultation bilaterally, no wheezing, no crackles. Normal respiratory effort. No accessory muscle use.  Cardiovascular: Regular rate and rhythm, no murmurs / rubs / gallops. No extremity edema. 2+ pedal pulses. Abdomen: no tenderness. Bowel sounds positive.  Musculoskeletal: no clubbing / cyanosis.  Neurologic: CN 2-12 grossly intact. Strength 5/5 in all 4.  Psychiatric: Normal judgment and insight. Normal mood.    Data Reviewed: I have personally reviewed following labs and imaging studies  CBC:  Recent Labs Lab 08/14/15 1805 08/15/15 0541 08/16/15 0245 08/17/15 0245 08/18/15 0759 08/19/15 0320  WBC 3.7* 2.5* 2.4* 1.9* 2.6* 2.0*  NEUTROABS 1.5* 1.2*  --   --   --   --   HGB 11.4* 13.0 10.6* 9.8* 10.0* 8.6*  HCT 34.9* 39.1 30.5* 28.7* 29.3* 26.3*  MCV 99.1  98.2 95.9 95.3 97.0 99.2  PLT 71* 38* 46* 57* 61* 51*   Basic Metabolic Panel:  Recent Labs Lab 08/15/15 0540 08/16/15 0245 08/17/15 0245 08/18/15 0751 08/19/15 0320  NA 131* 130* 136 133* 136  K 3.0* 3.1* 3.3* 3.5 3.3*  CL 94* 96* 101 102 99*  CO2 20* 17* 25 21* 25  GLUCOSE 77 99 86 99 100*  BUN 30* 40* 11 19 7   CREATININE 7.03* 8.74* 4.51* 7.21* 4.14*  CALCIUM 10.1 9.1 8.1* 9.0 8.6*  PHOS 6.6*  --   --  5.0* 2.5   GFR: Estimated Creatinine Clearance: 11.8 mL/min (by C-G formula based on Cr of 4.14). Liver Function Tests:  Recent Labs Lab 08/14/15 1805 08/15/15 0540 08/18/15 0751 08/19/15 0320  AST 74*  --   --   --   ALT 32  --   --   --   ALKPHOS 87  --   --   --   BILITOT 1.4*  --   --   --   PROT 7.6  --   --   --  ALBUMIN 3.0* 3.2* 2.3* 2.2*    Recent Labs Lab 08/14/15 1805  LIPASE 141*    Recent Labs Lab 08/15/15 0210 08/17/15 0245  AMMONIA 54* 40*   Coagulation Profile: No results for input(s): INR, PROTIME in the last 168 hours. Cardiac Enzymes:  Recent Labs Lab 08/14/15 1805 08/15/15 1525 08/15/15 1854 08/15/15 2100  TROPONINI 0.21* 0.18* 0.15* 0.18*   BNP (last 3 results) No results for input(s): PROBNP in the last 8760 hours. HbA1C: No results for input(s): HGBA1C in the last 72 hours. CBG:  Recent Labs Lab 08/14/15 1942  GLUCAP 80   Lipid Profile: No results for input(s): CHOL, HDL, LDLCALC, TRIG, CHOLHDL, LDLDIRECT in the last 72 hours. Thyroid Function Tests: No results for input(s): TSH, T4TOTAL, FREET4, T3FREE, THYROIDAB in the last 72 hours. Anemia Panel: No results for input(s): VITAMINB12, FOLATE, FERRITIN, TIBC, IRON, RETICCTPCT in the last 72 hours. Urine analysis:    Component Value Date/Time   COLORURINE AMBER* 08/03/2014 1249   APPEARANCEUR CLOUDY* 08/03/2014 1249   LABSPEC 1.020 08/03/2014 1249   PHURINE 7.0 08/03/2014 1249   GLUCOSEU 500* 08/03/2014 1249   HGBUR LARGE* 08/03/2014 1249   BILIRUBINUR  MODERATE* 08/03/2014 1249   KETONESUR 15* 08/03/2014 1249   PROTEINUR >300* 08/03/2014 1249   UROBILINOGEN 4.0* 08/03/2014 1249   NITRITE POSITIVE* 08/03/2014 1249   LEUKOCYTESUR TRACE* 08/03/2014 1249   Sepsis Labs: Invalid input(s): PROCALCITONIN, LACTICIDVEN  Recent Results (from the past 240 hour(s))  Culture, blood (Routine X 2) w Reflex to ID Panel     Status: None (Preliminary result)   Collection Time: 08/15/15  2:10 AM  Result Value Ref Range Status   Specimen Description BLOOD RIGHT HAND  Final   Special Requests BOTTLES DRAWN AEROBIC ONLY 5CC  Final   Culture NO GROWTH 3 DAYS  Final   Report Status PENDING  Incomplete  MRSA PCR Screening     Status: None   Collection Time: 08/15/15  2:12 AM  Result Value Ref Range Status   MRSA by PCR NEGATIVE NEGATIVE Final    Comment:        The GeneXpert MRSA Assay (FDA approved for NASAL specimens only), is one component of a comprehensive MRSA colonization surveillance program. It is not intended to diagnose MRSA infection nor to guide or monitor treatment for MRSA infections.   Culture, blood (Routine X 2) w Reflex to ID Panel     Status: None (Preliminary result)   Collection Time: 08/15/15  5:40 AM  Result Value Ref Range Status   Specimen Description BLOOD RIGHT HAND  Final   Special Requests IN PEDIATRIC BOTTLE 1CC  Final   Culture NO GROWTH 3 DAYS  Final   Report Status PENDING  Incomplete      Radiology Studies: No results found.   Scheduled Meds: . feeding supplement (NEPRO CARB STEADY)  237 mL Oral BID BM  . lactulose  30 g Oral TID  . levothyroxine  50 mcg Oral QAC breakfast  . midodrine  10 mg Oral TID WC  . multivitamin  1 tablet Oral QHS  . pantoprazole  40 mg Oral BID AC   Continuous Infusions:   Pamella Pert, MD, PhD [facility] Pager [phone]  If 7PM-7AM, please contact night-coverage [email or web address] Password [email or web address] 08/19/2015, 11:42 AM

## 2015-08-19 NOTE — Progress Notes (Signed)
  [name] Progress Note   Subjective: BP 80-90's  Filed Vitals:   08/19/15 0900 08/19/15 1000 08/19/15 1100 08/19/15 1200  BP: 89/54 96/63 83/50  87/51  Pulse:      Temp:      TempSrc:      Resp: 19 18 16 13   Height:      Weight:      SpO2:        Inpatient medications: . feeding supplement (NEPRO CARB STEADY)  237 mL Oral BID BM  . lactulose  30 g Oral TID  . levothyroxine  50 mcg Oral QAC breakfast  . midodrine  10 mg Oral TID WC  . multivitamin  1 tablet Oral QHS  . pantoprazole  40 mg Oral BID AC     acetaminophen, acetaminophen, gi cocktail, HYDROcodone-acetaminophen, morphine injection, ondansetron (ZOFRAN) IV  Exam: Chron ill appearing, difficult to understand and very HOH, this is baseline No jvd Chest clear bilat RRR no mrg Abd soft ntnd no ascite No LE edema R IJ perm cath Neuro alert, responds to questions, affect normal  Dialysis: [facility] MWF  4h  F160  75kg   2/ 2.5 bath  P4  IJ cath  Heparin none Hect 2 ug      Assessment: 1 Hypotension - prob vol depleted, weights up 5-6kg above prior dry wt and no pulm edema or other edema on exam.  Will keep new dry wt at 80kg.   2 Debility for SNFP 3 ESRD HD MWF 4 Abd pain , chron issue 5 MBD stable 6 Vag bleeding - none here  Plan - HD Friday.  SNFP per primary.  Poor prognosis overall, consider re-eval code status.    Vinson Moselleob Samantha Mullet MD [facility] pager [phone]    cell [phone] 08/19/2015, 12:42 PM    Recent Labs Lab 08/15/15 0540  08/17/15 0245 08/18/15 0751 08/19/15 0320  NA 131*  < > 136 133* 136  K 3.0*  < > 3.3* 3.5 3.3*  CL 94*  < > 101 102 99*  CO2 20*  < > 25 21* 25  GLUCOSE 77  < > 86 99 100*  BUN 30*  < > 11 19 7   CREATININE 7.03*  < > 4.51* 7.21* 4.14*  CALCIUM 10.1  < > 8.1* 9.0 8.6*  PHOS 6.6*  --   --  5.0* 2.5  < > = values in this interval not displayed.  Recent Labs Lab 08/14/15 1805 08/15/15 0540 08/18/15 0751 08/19/15 0320  AST 74*   --   --   --   ALT 32  --   --   --   ALKPHOS 87  --   --   --   BILITOT 1.4*  --   --   --   PROT 7.6  --   --   --   ALBUMIN 3.0* 3.2* 2.3* 2.2*    Recent Labs Lab 08/14/15 1805 08/15/15 0541  08/17/15 0245 08/18/15 0759 08/19/15 0320  WBC 3.7* 2.5*  < > 1.9* 2.6* 2.0*  NEUTROABS 1.5* 1.2*  --   --   --   --   HGB 11.4* 13.0  < > 9.8* 10.0* 8.6*  HCT 34.9* 39.1  < > 28.7* 29.3* 26.3*  MCV 99.1 98.2  < > 95.3 97.0 99.2  PLT 71* 38*  < > 57* 61* 51*  < > = values in this interval not displayed.

## 2015-08-20 LAB — RENAL FUNCTION PANEL
Albumin: 2.1 g/dL — ABNORMAL LOW (ref 3.5–5.0)
Anion gap: 14 (ref 5–15)
BUN: 15 mg/dL (ref 6–20)
CHLORIDE: 98 mmol/L — AB (ref 101–111)
CO2: 25 mmol/L (ref 22–32)
Calcium: 9.1 mg/dL (ref 8.9–10.3)
Creatinine, Ser: 6.16 mg/dL — ABNORMAL HIGH (ref 0.44–1.00)
GFR calc Af Amer: 7 mL/min — ABNORMAL LOW (ref >60)
GFR calc non Af Amer: 6 mL/min — ABNORMAL LOW (ref >60)
GLUCOSE: 90 mg/dL (ref 65–99)
POTASSIUM: 3.4 mmol/L — AB (ref 3.5–5.1)
Phosphorus: 4.4 mg/dL (ref 2.5–4.6)
Sodium: 137 mmol/L (ref 135–145)

## 2015-08-20 LAB — CBC WITH DIFFERENTIAL/PLATELET
BASOS PCT: 0 %
Basophils Absolute: 0 10*3/uL (ref 0.0–0.1)
EOS PCT: 5 %
Eosinophils Absolute: 0.1 10*3/uL (ref 0.0–0.7)
HEMATOCRIT: 26.7 % — AB (ref 36.0–46.0)
Hemoglobin: 9 g/dL — ABNORMAL LOW (ref 12.0–15.0)
Lymphocytes Relative: 29 %
Lymphs Abs: 0.7 10*3/uL (ref 0.7–4.0)
MCH: 33.8 pg (ref 26.0–34.0)
MCHC: 33.7 g/dL (ref 30.0–36.0)
MCV: 100.4 fL — AB (ref 78.0–100.0)
MONO ABS: 0.3 10*3/uL (ref 0.1–1.0)
MONOS PCT: 11 %
NEUTROS ABS: 1.3 10*3/uL — AB (ref 1.7–7.7)
Neutrophils Relative %: 55 %
Platelets: 56 10*3/uL — ABNORMAL LOW (ref 150–400)
RBC: 2.66 MIL/uL — ABNORMAL LOW (ref 3.87–5.11)
RDW: 16.5 % — AB (ref 11.5–15.5)
WBC: 2.3 10*3/uL — ABNORMAL LOW (ref 4.0–10.5)

## 2015-08-20 LAB — CULTURE, BLOOD (ROUTINE X 2)
CULTURE: NO GROWTH
Culture: NO GROWTH

## 2015-08-20 LAB — BASIC METABOLIC PANEL
BUN: 15 mg/dL (ref 4–21)
Creatinine: 6.2 mg/dL — AB (ref 0.5–1.1)
GLUCOSE: 90 mg/dL
SODIUM: 137 mmol/L (ref 137–147)

## 2015-08-20 LAB — CBC AND DIFFERENTIAL: WBC: 2.3 10*3/mL

## 2015-08-20 MED ORDER — LIDOCAINE HCL (PF) 1 % IJ SOLN: 5.0000 mL | INTRAMUSCULAR | Status: DC | PRN

## 2015-08-20 MED ORDER — LIDOCAINE-PRILOCAINE 2.5-2.5 % EX CREA
1.0000 "application " | TOPICAL_CREAM | CUTANEOUS | Status: DC | PRN
  Filled 2015-08-20: qty 5

## 2015-08-20 MED ORDER — ALTEPLASE 2 MG IJ SOLR: 2.0000 mg | Freq: Once | INTRAMUSCULAR | Status: DC | PRN

## 2015-08-20 MED ORDER — SODIUM CHLORIDE 0.9 % IV SOLN: 100.0000 mL | INTRAVENOUS | Status: DC | PRN

## 2015-08-20 MED ORDER — HYDROCODONE-ACETAMINOPHEN 5-325 MG PO TABS: 1.0000 | ORAL_TABLET | Freq: Four times a day (QID) | ORAL | Status: DC | PRN

## 2015-08-20 MED ORDER — HEPARIN SODIUM (PORCINE) 1000 UNIT/ML DIALYSIS: 1000.0000 [IU] | INTRAMUSCULAR | Status: DC | PRN

## 2015-08-20 MED ORDER — PENTAFLUOROPROP-TETRAFLUOROETH EX AERO: 1.0000 "application " | INHALATION_SPRAY | CUTANEOUS | Status: DC | PRN

## 2015-08-20 MED ORDER — MIDODRINE HCL 10 MG PO TABS: 10.0000 mg | ORAL_TABLET | Freq: Three times a day (TID) | ORAL | Status: DC

## 2015-08-20 NOTE — Discharge Summary (Signed)
Physician Discharge Summary  SHANEQUIA KENDRICK UEA:[id number] DOB: 05-27-1945 DOA: 08/14/2015  PCP: Dorrene German, MD  Admit date: 08/14/2015 Discharge date: 08/20/2015  Time spent: > 30 minutes  Recommendations for Outpatient Follow-up:  1. Follow up with PCP in 1-2 weeks 2. HD as scheduled 3. Please monitor BP. Please check BP with appropriate cuff and avoid wrist checks since this may give falsely low readings. 4. Her Midodrine was increased to three times daily.    Discharge Diagnoses:  Principal Problem:   Hypotension Active Problems:   Anemia in chronic kidney disease (CKD)   Abdominal pain   Hyponatremia   Thrombocytopenia (HCC)   Cirrhosis of liver (HCC)   ESRD on dialysis [facility])   Vaginal bleeding   Discharge Condition: stable  Diet recommendation: renal  Filed Weights   08/18/15 1225 08/18/15 1609 08/20/15 0657  Weight: 81.3 kg (179 lb 3.7 oz) 81.2 kg (179 lb 0.2 oz) 83.5 kg (184 lb 1.4 oz)    History of present illness:  See H&P, Labs, Consult and Test reports for all details in brief, patient is a 70 y.o. female with medical history significant of ESRD on HD(M/W/F), hypothyroidism, hypotension, liver cirrhosis, osteoarthritis, HTN, anemia, dizziness; who presents with complaints of abdominal pain and dizziness, Workup was significant for elevated troponin and hypotension, blood pressure in ED 62/43  Hospital Course:  Hypotension with dizziness - Presented with low blood pressure in ED 62/43, patient with chronic hypotension at baseline but this was worse, received IV boluses with improvement. Patient did not appear to be septic, lactic acid 1.7 on admission, afebrile, no leukocytosis, blood cultures remain negative, stopped broad-spectrum IV antibiotics 5/1 and stable since. Follow on blood cultures , remains negative to date. 2-D echo with normal EF 55-60%, grade 1 DD. Am cortisol 8.8, stim done 5/3 with appropriate response 11.3 >> 20.1 >> 24.5. Midodrine has  been increased to TID. Her BP usually runs 80-90s systolic, asymptomatic. Please ensure that BP is checked with cuff of right size and avoid wrist checks.  Elevated troponin - Cardiology consult appreciated, his is most likely in the setting of demand ischemia, 2D echo normal, no further workup Abdominal pain  - Patient with known history of chronic abdominal pain, benign physical exam abdominal exam since admission, denies any further abdominal pain, Lipase elevated at 141, but by reviewing previous records it was consistently elevated. Pain resolved Vaginal spotting - ultrasound pelvis with significant endometrial thickening, will need endometrial sampling as an outpatient, Dr. Randol Kern discussed with Dr. Adrian Blackwater, they will arrange for outpatient follow-up ESRD - Renal consulted, on hemodialysis Monday Wednesday Friday. Dry weight increased to help with BP Cirrhosis - Patient with just a mildly elevated ammonia level 54, continue lactulose. This is also contributing to chronically low BPs. Encephalopathy - Patient was delayed response and sluggish speech, she is extremely hard of hearing, slightly worse than her baseline as per granddaughter, so MRI brain has been obtained, no evidence of acute infarct, CT head with no acute finding, had mildly elevated ammonia level, so lactulose has been increased with improvement in her ammonia 54 > 40. Encephalopathy resolved. Diastolic CHF - Volume management with dialysis Anemia of chronic renal Disease - stable Thrombocytopenia (HCC): -Likely due to cirrhosis. Platelet fluctuating. No bleeding Hypothyroidism - continue Levothyroxine  Procedures:  HD  2D echo   Consultations:  Nephrology   Cardiology   Discharge Exam: Filed Vitals:   08/20/15 0930 08/20/15 1000 08/20/15 1030 08/20/15 1100  BP: 84/46  83/48 83/49 82/57   Pulse: 89 86 87 89  Temp:      TempSrc:      Resp:      Height:      Weight:      SpO2:        General:  NAD Cardiovascular: RRR Respiratory: CTA biL  Discharge Instructions Activity:  As tolerated   Get Medicines reviewed and adjusted: Please take all your medications with you for your next visit with your Primary MD  Please request your Primary MD to go over all hospital tests and procedure/radiological results at the follow up, please ask your Primary MD to get all Hospital records sent to his/her office.  If you experience worsening of your admission symptoms, develop shortness of breath, life threatening emergency, suicidal or homicidal thoughts you must seek medical attention immediately by calling 911 or calling your MD immediately if symptoms less severe.  You must read complete instructions/literature along with all the possible adverse reactions/side effects for all the Medicines you take and that have been prescribed to you. Take any new Medicines after you have completely understood and accpet all the possible adverse reactions/side effects.   Do not drive when taking Pain medications.   Do not take more than prescribed Pain, Sleep and Anxiety Medications  Special Instructions: If you have smoked or chewed Tobacco in the last 2 yrs please stop smoking, stop any regular Alcohol and or any Recreational drug use.  Wear Seat belts while driving.  Please note  You were cared for by a hospitalist during your hospital stay. Once you are discharged, your primary care physician will handle any further medical issues. Please note that NO REFILLS for any discharge medications will be authorized once you are discharged, as it is imperative that you return to your primary care physician (or establish a relationship with a primary care physician if you do not have one) for your aftercare needs so that they can reassess your need for medications and monitor your lab values.    Medication List    STOP taking these medications        metoprolol 50 MG tablet  Commonly known as:   LOPRESSOR      TAKE these medications        acetaminophen 500 MG tablet  Commonly known as:  TYLENOL  Take 500 mg by mouth every 6 (six) hours as needed for moderate pain.     HYDROcodone-acetaminophen 5-325 MG tablet  Commonly known as:  NORCO/VICODIN  Take 1 tablet by mouth every 6 (six) hours as needed for moderate pain.     lactulose 10 GM/15ML solution  Commonly known as:  CHRONULAC  Take 45 mLs (30 g total) by mouth 2 (two) times daily.     levothyroxine 50 MCG tablet  Commonly known as:  SYNTHROID, LEVOTHROID  Take 50 mcg by mouth daily before breakfast.     midodrine 10 MG tablet  Commonly known as:  PROAMATINE  Take 1 tablet (10 mg total) by mouth 3 (three) times daily.     pantoprazole 40 MG tablet  Commonly known as:  PROTONIX  Take 1 tablet (40 mg total) by mouth 2 (two) times daily before a meal.           Follow-up Information    Follow up with STINSON, JACOB JEHIEL, DO.   Specialty:  Family Medicine   Contact information:   [address] [place] [id number] [phone]  The results of significant diagnostics from this hospitalization (including imaging, microbiology, ancillary and laboratory) are listed below for reference.    Significant Diagnostic Studies: Dg Chest 2 View  08/14/2015  CLINICAL DATA:  Hypotensive.  Diaphysis yesterday EXAM: CHEST  2 VIEW COMPARISON:  06/14/2015 FINDINGS: Right chest wall dialysis catheter is noted with tips in the right atrium. Mild cardiac enlargement. There is mild pulmonary edema. No pleural fluid identified. No airspace consolidation. Previous bilateral shoulder arthroplasty. IMPRESSION: Mild cardiac enlargement and mild interstitial edema. Electronically Signed   By: Signa Kell M.D.   On: 08/14/2015 19:02   Ct Head Wo Contrast  08/14/2015  CLINICAL DATA:  Headache, dizziness. EXAM: CT HEAD WITHOUT CONTRAST TECHNIQUE: Contiguous axial images were obtained from the base of the skull through the  vertex without intravenous contrast. COMPARISON:  CT scan of June 14, 2015. FINDINGS: Bony calvarium appears intact. Mild diffuse cortical atrophy is noted. Mild chronic ischemic white matter disease is noted. No mass effect or midline shift is noted. Ventricular size is within normal limits. There is no evidence of mass lesion, hemorrhage or acute infarction. IMPRESSION: Mild diffuse cortical atrophy. Mild chronic ischemic white matter disease. No acute intracranial abnormality seen. Electronically Signed   By: Lupita Raider, M.D.   On: 08/14/2015 20:51   Mr Brain Wo Contrast  08/17/2015  CLINICAL DATA:  Slow rate of speech and dizziness beginning a couple days ago. Personal history of end-stage renal disease and dialysis. EXAM: MRI HEAD WITHOUT CONTRAST TECHNIQUE: Multiplanar, multiecho pulse sequences of the brain and surrounding structures were obtained without intravenous contrast. COMPARISON:  CT head without contrast 08/14/2015. FINDINGS: The diffusion-weighted images demonstrate no evidence for acute or subacute infarction. Severe diffuse cerebral and cerebellar atrophy is noted. There is mild periventricular white matter changes well. The ventricles are proportionate to the degree of atrophy. No significant extra-axial fluid collection is present. There is a remote lacunar infarct of the right cerebellum. The internal auditory canals are within normal limits bilaterally. The brainstem is unremarkable. Flow is present in the major intracranial arteries. Bilateral lens replacements are present. The globes and orbits are intact. The paranasal sinuses and mastoid air cells are clear. The skullbase is within normal limits. Midline sagittal images of the intracranial structures are unremarkable. Multilevel degenerate changes are present within the cervical spine. IMPRESSION: 1. No acute intracranial abnormality. 2. Severe diffuse cerebral and cerebellar atrophy with mild white matter disease. This likely  reflects the sequela of chronic microvascular ischemia. 3. Remote lacunar infarct of the right cerebellum. 4. Multilevel degenerative changes in the cervical spine. Electronically Signed   By: Marin Manchester M.D.   On: 08/17/2015 12:10   US Pelvis Complete  08/15/2015  CLINICAL DATA:  Vaginal bleeding EXAM: TRANSABDOMINAL ULTRASOUND OF PELVIS TECHNIQUE: Transabdominal ultrasound examination of the pelvis was performed including evaluation of the uterus, ovaries, adnexal regions, and pelvic cul-de-sac. COMPARISON:  None. FINDINGS: Uterus Measurements: 8.3 x 4.2 x 5.6 cm. No fibroids or other mass visualized. Endometrium Thickness: 16.7 mm.  Heterogeneous in appearance. Right ovary Measurements: 2.2 x 1.3 x 2.0 cm. Normal appearance/no adnexal mass. Left ovary Measurements: 3.4 x 2.2 x 2.5 cm. Normal appearance/no adnexal mass. Other findings:  No abnormal free fluid. IMPRESSION: 1. The endometrium is thickened measuring 16.7 mm. In the setting of post-menopausal bleeding, endometrial sampling is indicated to exclude carcinoma. If results are benign, sonohysterogram should be considered for focal lesion work-up. (Ref: Radiological Reasoning: Algorithmic Workup of Abnormal Vaginal Bleeding with  Endovaginal Sonography and Sonohysterography. AJR 2008; 161:W96-04) Electronically Signed   By: Signa Kell M.D.   On: 08/15/2015 15:49    Microbiology: Recent Results (from the past 240 hour(s))  Culture, blood (Routine X 2) w Reflex to ID Panel     Status: None (Preliminary result)   Collection Time: 08/15/15  2:10 AM  Result Value Ref Range Status   Specimen Description BLOOD RIGHT HAND  Final   Special Requests BOTTLES DRAWN AEROBIC ONLY 5CC  Final   Culture NO GROWTH 4 DAYS  Final   Report Status PENDING  Incomplete  MRSA PCR Screening     Status: None   Collection Time: 08/15/15  2:12 AM  Result Value Ref Range Status   MRSA by PCR NEGATIVE NEGATIVE Final    Comment:        The GeneXpert MRSA  Assay (FDA approved for NASAL specimens only), is one component of a comprehensive MRSA colonization surveillance program. It is not intended to diagnose MRSA infection nor to guide or monitor treatment for MRSA infections.   Culture, blood (Routine X 2) w Reflex to ID Panel     Status: None (Preliminary result)   Collection Time: 08/15/15  5:40 AM  Result Value Ref Range Status   Specimen Description BLOOD RIGHT HAND  Final   Special Requests IN PEDIATRIC BOTTLE 1CC  Final   Culture NO GROWTH 4 DAYS  Final   Report Status PENDING  Incomplete     Labs: Basic Metabolic Panel:  Recent Labs Lab 08/14/15 1805 08/15/15 0540 08/16/15 0245 08/17/15 0245 08/18/15 0751 08/19/15 0320 08/20/15 0731  NA 127* 131* 130* 136 133* 136 137  K 3.9 3.0* 3.1* 3.3* 3.5 3.3* 3.4*  CL 90* 94* 96* 101 102 99* 98*  CO2 20* 20* 17* 25 21* 25 25  GLUCOSE 98 77 99 86 99 100* 90  BUN 25* 30* 40* CREATININE 6.47* 7.03* 8.74* 4.51* 7.21* 4.14* 6.16*  CALCIUM 9.9 10.1 9.1 8.1* 9.0 8.6* 9.1  PHOS  --  6.6*  --   --  5.0* 2.5 4.4   Liver Function Tests:  Recent Labs Lab 08/14/15 1805 08/15/15 0540 08/18/15 0751 08/19/15 0320 08/20/15 0731  AST 74*  --   --   --   --   ALT 32  --   --   --   --   ALKPHOS 87  --   --   --   --   BILITOT 1.4*  --   --   --   --   PROT 7.6  --   --   --   --   ALBUMIN 3.0* 3.2* 2.3* 2.2* 2.1*    Recent Labs Lab 08/14/15 1805  LIPASE 141*    Recent Labs Lab 08/15/15 0210 08/17/15 0245  AMMONIA 54* 40*   CBC:  Recent Labs Lab 08/14/15 1805 08/15/15 0541 08/16/15 0245 08/17/15 0245 08/18/15 0759 08/19/15 0320 08/20/15 0731  WBC 3.7* 2.5* 2.4* 1.9* 2.6* 2.0* 2.3*  NEUTROABS 1.5* 1.2*  --   --   --   --  1.3*  HGB 11.4* 13.0 10.6* 9.8* 10.0* 8.6* 9.0*  HCT 34.9* 39.1 30.5* 28.7* 29.3* 26.3* 26.7*  MCV 99.1 98.2 95.9 95.3 97.0 99.2 100.4*  PLT 71* 38* 46* 57* 61* 51* 56*   Cardiac Enzymes:  Recent Labs Lab 08/14/15 1805  08/15/15 1525 08/15/15 1854 08/15/15 2100  TROPONINI 0.21* 0.18* 0.15* 0.18*   CBG:  Recent Labs Lab 08/14/15 1942  GLUCAP 80    Signed:  Pamella Pert  [facility] 08/20/2015, 11:13 AM

## 2015-08-20 NOTE — Clinical Social Work Note (Signed)
Patient to be d/c'ed today to [facility].  Patient and family agreeable to plans will transport via ems RN to call report to [phone].  Aftin Lye, MSW, LCSWA [phone]  

## 2015-08-20 NOTE — Progress Notes (Signed)
[facility]..Marland Kitchen

## 2015-08-20 NOTE — Progress Notes (Signed)
[facility],  Stable, belongings with pt.,discharge papers given to ambulance staff.

## 2015-08-20 NOTE — Progress Notes (Signed)
[facility] Progress Note   Subjective:  "I had some stomach pain this morning, I haven't had any breakfast yet..." Patient appears back to OP baseline. Alert, cooperative, EXTREMELY HOH. No C/O pain at present. Getting earbuds so she can hear TV.   Objective Filed Vitals:   08/20/15 0600 08/20/15 0657 08/20/15 0710 08/20/15 0730  BP: 101/57 93/50 95/52  98/54  Pulse: 86 89 86 85  Temp:  98.4 F (36.9 C)    TempSrc:  Oral    Resp: 15 17 20    Height:      Weight:  83.5 kg (184 lb 1.4 oz)    SpO2: 100% 100%     Physical Exam General: Awake, Alert, pleasant. NAD Heart: S1, S2, RRR Lungs: Bilateral breath sounds CTA Abdomen: Soft, tenderness upper quadrants Extremities: No LE edema-has "fat ankles and calves"   Additional Objective Labs: Basic Metabolic Panel:  Recent Labs Lab 08/18/15 0751 08/19/15 0320 08/20/15 0731  NA 133* 136 137  K 3.5 3.3* 3.4*  CL 102 99* 98*  CO2 21* 25 25  GLUCOSE 99 100* 90  BUN 19 7 15   CREATININE 7.21* 4.14* 6.16*  CALCIUM 9.0 8.6* 9.1  PHOS 5.0* 2.5 4.4   Liver Function Tests:  Recent Labs Lab 08/14/15 1805  08/18/15 0751 08/19/15 0320 08/20/15 0731  AST 74*  --   --   --   --   ALT 32  --   --   --   --   ALKPHOS 87  --   --   --   --   BILITOT 1.4*  --   --   --   --   PROT 7.6  --   --   --   --   ALBUMIN 3.0*  < > 2.3* 2.2* 2.1*  < > = values in this interval not displayed.  Recent Labs Lab 08/14/15 1805  LIPASE 141*   CBC:  Recent Labs Lab 08/14/15 1805 08/15/15 0541 08/16/15 0245 08/17/15 0245 08/18/15 0759 08/19/15 0320 08/20/15 0731  WBC 3.7* 2.5* 2.4* 1.9* 2.6* 2.0* 2.3*  NEUTROABS 1.5* 1.2*  --   --   --   --  1.3*  HGB 11.4* 13.0 10.6* 9.8* 10.0* 8.6* 9.0*  HCT 34.9* 39.1 30.5* 28.7* 29.3* 26.3* 26.7*  MCV 99.1 98.2 95.9 95.3 97.0 99.2 100.4*  PLT 71* 38* 46* 57* 61* 51* 56*   Blood Culture    Component Value Date/Time   SDES BLOOD RIGHT HAND 08/15/2015 0540   SPECREQUEST IN  PEDIATRIC BOTTLE 1CC 08/15/2015 0540   CULT NO GROWTH 4 DAYS 08/15/2015 0540   REPTSTATUS PENDING 08/15/2015 0540    Cardiac Enzymes:  Recent Labs Lab 08/14/15 1805 08/15/15 1525 08/15/15 1854 08/15/15 2100  TROPONINI 0.21* 0.18* 0.15* 0.18*   CBG:  Recent Labs Lab 08/14/15 1942  GLUCAP 80   Studies/Results: No results found. Medications:   . feeding supplement (NEPRO CARB STEADY)  237 mL Oral BID BM  . lactulose  30 g Oral TID  . levothyroxine  50 mcg Oral QAC breakfast  . midodrine  10 mg Oral TID WC  . multivitamin  1 tablet Oral QHS  . pantoprazole  40 mg Oral BID AC    [facility] M[facility] 4h F160 75kg 2/ 2.5 bath P4 IJ cath Heparin none Hect 2 ug    Assessment: 1 Hypotension - prob vol depleted, weights up 5-6kg above prior dry wt and no pulm edema or other edema on  exam.Cortisol stim test normal. Will keep new dry wt at 80kg.On HD UFG 1.5 liters. SBP 80s. Tolerating well.  2 Debility for SNFP 3 ESRD HD MWF. On HD today, K+ 3.4 on 4.0 K bath 4 Abd pain , chronic issue 5 MBD stable Ca 9.1 C Ca 10.6-hold VDRA. Phos 4.4 6 Vag bleeding - none here. Endometrial thickening per U/S.To F/U with OB/GYN when Dc'd.  7. Encephalopathy: MRI negative for acute infarct. Ammonia level down. Actually appears to be at baseline. 8. Nutrition: Albumin 2.1 Renal diet, prostat, renal vit.   Disposition: SNF placement. Remains full code.   Rita H. Manson Passey, NP-C 08/16/2015, 12:57 PM  [facility] Beeper [phone]  Pt seen, examined and agree w A/P as above.  Vinson Moselle MD [employer] pager [phone]    cell [phone] 08/20/2015, 12:47 PM

## 2015-08-26 ENCOUNTER — Encounter: Payer: Self-pay | Admitting: Adult Health

## 2015-08-26 ENCOUNTER — Non-Acute Institutional Stay (SKILLED_NURSING_FACILITY): Payer: Medicare Other | Admitting: Adult Health

## 2015-08-26 DIAGNOSIS — D696 Thrombocytopenia, unspecified: Secondary | ICD-10-CM

## 2015-08-26 DIAGNOSIS — N186 End stage renal disease: Secondary | ICD-10-CM

## 2015-08-26 DIAGNOSIS — Z992 Dependence on renal dialysis: Secondary | ICD-10-CM

## 2015-08-26 DIAGNOSIS — K7460 Unspecified cirrhosis of liver: Secondary | ICD-10-CM | POA: Diagnosis not present

## 2015-08-26 DIAGNOSIS — I959 Hypotension, unspecified: Secondary | ICD-10-CM

## 2015-08-26 DIAGNOSIS — B1910 Unspecified viral hepatitis B without hepatic coma: Secondary | ICD-10-CM

## 2015-08-26 NOTE — Progress Notes (Signed)
Patient ID: Samantha [name], female   DOB: [birth year], 70 y.o.   MRN: [id number]   Location:  [facility] Room Number: 902 Place of Service:  SNF (31)   CODE STATUS: DNR  Allergies  Allergen Reactions  . Banana Other (See Comments)    Due to dialysis  . Chocolate Other (See Comments)    Due to dialysis  . Fish-Derived Products Other (See Comments)    Due to gout    Chief Complaint  Patient presents with  . Hospitalization Follow-up    Hospital follow up    HPI:  She has been hospitalized for hypotension with dizziness she has end stage renal disease on hemodialysis she has vaginal spotting which will have outpatient follow up. She has cirrhosis with encephalopathy. She is here for short term rehab at this time. I am not certain if going home will be an option for her.    Past Medical History  Diagnosis Date  . Shoulder pain   . Headache(784.0)     occasionally  . Dizziness   . Occasional numbness/prickling/tingling of fingers and toes   . Arthritis     left shoulder  . Joint pain   . Joint swelling   . Gastric ulcer   . Dry skin   . Peripheral edema   . Constipation   . Oligouria   . H/O: GI bleed   . Hepatitis     Hep B  . History of kidney stones   . History of blood transfusion   . Hypertension   . Anemia   . Hypothyroidism     takes Synthroid daily  . Impaired hearing   . Diabetes mellitus     borderline  . Renal disorder     m, w, F     Past Surgical History  Procedure Laterality Date  . Shoulder surgery  3-77yrs ago    right replacement  . Esophagogastroduodenoscopy  06/09/2011    Procedure: ESOPHAGOGASTRODUODENOSCOPY (EGD);  Surgeon: Theda Belfast, MD;  Location: [facility] ENDOSCOPY;  Service: Endoscopy;  Laterality: N/A;  . Cataract surgery      bilateral  . Reverse shoulder arthroplasty  09/22/2011    Procedure: REVERSE SHOULDER ARTHROPLASTY;  Surgeon: Verlee Rossetti, MD;  Location: [facility] OR;  Service: Orthopedics;   Laterality: Left;  left reverse shoulder arthroplasty  . Cholecystectomy  12/26/2011  . Cholecystectomy  12/26/2011    Procedure: LAPAROSCOPIC CHOLECYSTECTOMY;  Surgeon: Atilano Ina, MD,FACS;  Location: [facility] OR;  Service: General;  Laterality: N/A;  . Arteriovenous graft placement Left     forearm  . Shuntogram Left 08/29/2012    Procedure: SHUNTOGRAM;  Surgeon: Fransisco Hertz, MD;  Location: [facility] CATH LAB;  Service: Cardiovascular;  Laterality: Left;  arm  . Revision of arteriovenous goretex graft Left 01/12/2015    Procedure: REVISION OF Left FOREARM ARTERIOVENOUS GORETEX GRAFT;  Surgeon: Fransisco Hertz, MD;  Location: [facility] OR;  Service: Vascular;  Laterality: Left;  . Insertion of dialysis catheter Right 01/12/2015    Procedure: INSERTION OF DIALYSIS CATHETER;  Surgeon: Fransisco Hertz, MD;  Location: [facility] OR;  Service: Vascular;  Laterality: Right;  . I&d extremity Left 03/23/2015    Procedure: DRAINAGE OF LEFT ARM SEROMA;  Surgeon: Fransisco Hertz, MD;  Location: [facility] OR;  Service: Vascular;  Laterality: Left;  . Ligation arteriovenous gortex graft Left 03/23/2015    Procedure: LIGATION AND EXCISION OF LEFT ARTERIOVENOUS GORTEX GRAFT;  Surgeon: Arlys John  Rolena InfanteL Chen, MD;  [facility];  Service: Vascular;  Laterality: Left;    Social History   Social History  . Marital Status: Widowed    Spouse Name: N/A  . Number of Children: N/A  . Years of Education: N/A   Occupational History  . Not on file.   Social History Main Topics  . Smoking status: Never Smoker   . Smokeless tobacco: Current User    Types: Chew  . Alcohol Use: No     Comment: quit 4-5669yrs ago  . Drug Use: No  . Sexual Activity: No   Other Topics Concern  . Not on file   Social History Narrative   Family History  Problem Relation Age of Onset  . Hypertension Mother   . Diabetes Mother   . Cancer Brother       VITAL SIGNS BP 103/63 mmHg  Pulse 92  Temp(Src) 98.4 F (36.9 C) (Oral)  Resp 20  Ht 5\' 1"  (1.549 m)  Wt 181 lb  (82.101 kg)  BMI 34.22 kg/m2  SpO2 94%  Patient's Medications  New Prescriptions   No medications on file  Previous Medications   ACETAMINOPHEN (TYLENOL) 500 MG TABLET    Take 500 mg by mouth every 6 (six) hours as needed for moderate pain.   HYDROCODONE-ACETAMINOPHEN (NORCO/VICODIN) 5-325 MG TABLET    Take 1 tablet by mouth every 6 (six) hours as needed for moderate pain.   LACTULOSE (CHRONULAC) 10 GM/15ML SOLUTION    Take 45 mLs (30 g total) by mouth 2 (two) times daily.   LEVOTHYROXINE (SYNTHROID, LEVOTHROID) 50 MCG TABLET    Take 50 mcg by mouth daily before breakfast.    MIDODRINE (PROAMATINE) 10 MG TABLET    Take 1 tablet (10 mg total) by mouth 3 (three) times daily.   PANTOPRAZOLE (PROTONIX) 40 MG TABLET    Take 1 tablet (40 mg total) by mouth 2 (two) times daily before a meal.  Modified Medications   No medications on file  Discontinued Medications   No medications on file     SIGNIFICANT DIAGNOSTIC EXAMS  08-14-15: chest x-ray: Mild cardiac enlargement and mild interstitial edema  08-14-15: ct of head: Mild diffuse cortical atrophy. Mild chronic ischemic white matter disease. No acute intracranial abnormality seen.  08-15-15: ultrasound of pelvis: 1. The endometrium is thickened measuring 16.7 mm. In the setting ofpost-menopausal bleeding, endometrial sampling is indicated to exclude carcinoma. If results are benign, sonohysterogram should be considered for focal lesion work-up.    08-17-15; 2-d echo: - Left ventricle: The cavity size was normal. There was severe concentric hypertrophy. Systolic function was normal. The estimated ejection fraction was in the range of 55% to 60%. Wall motion was normal; there were no regional wall motion  abnormalities. Doppler parameters are consistent with abnormal left ventricular relaxation (grade 1 diastolic dysfunction)  Doppler parameters are consistent with high ventricular filling pressure. - Aortic valve: Transvalvular velocity was within  the normal range. There was no stenosis. There was no regurgitation. - Mitral valve: Calcified annulus. There was mild regurgitation. - Left atrium: The atrium was severely dilated. - Right ventricle: The cavity size was normal. Wall thickness was normal. Systolic function was normal. - Tricuspid valve: There was mild regurgitation. - Pulmonary arteries: Systolic pressure was within the normal range. PA peak pressure: 26 mm Hg (S).  LABS REVIEWED:  08-14-15: wbc 3.7; hgb 11.4; hct 34.9; mcv 99.1; plt 71; glucose 98; bun 25; creat 6.47; k+ 3.9; na++127;  ast 74; total bili 1.4; albumin 3.0 08-15-15: ammonia 54 08-18-15; wbc 2.6; hgb 10.0; hct 29.3; mcv 97.0; plt 61; glucose 99; bun 19; creat 7.21; k+ 3.5; na++135 phos 5.0; albumin 2.3 08-20-15; wbc 2.3; hgb 9.0; hct 26.7; mcv 100.4; plt 56; glucose 90; bun 15; creat 6.16; k+ 3.4; na++137; phos 4.4; albumin 2.1    Review of Systems  Constitutional: Negative for malaise/fatigue.  Respiratory: Negative for cough and shortness of breath.   Cardiovascular: Negative for chest pain.  Gastrointestinal: Negative for heartburn, abdominal pain and constipation.  Musculoskeletal: Negative for back pain and joint pain.  Skin: Negative.   Psychiatric/Behavioral: The patient is not nervous/anxious.     Physical Exam  Constitutional: No distress.  Eyes: Conjunctivae are normal.  Neck: Neck supple. No JVD present. No thyromegaly present.  Cardiovascular: Normal rate, regular rhythm and intact distal pulses.   Respiratory: Effort normal and breath sounds normal. No respiratory distress. She has no wheezes.  GI: Soft. Bowel sounds are normal. She exhibits no distension. There is no tenderness.  Musculoskeletal: She exhibits edema.  Able to move all extremities  Has 1+ lower extremity   Lymphadenopathy:    She has no cervical adenopathy.  Neurological: She is alert.  Skin: Skin is warm and dry. She is not diaphoretic.  Left arm a/v shunt: + thril + bruit    Psychiatric: She has a normal mood and affect.       ASSESSMENT/ PLAN:  1. ESRD: is on hemodialysis three days weekly; is followed by nephrology   2. Cirrhosis: will continue lactulose 45 cc twice daily   3. Hypothyroidism: will continue synthroid 50 mcg daily  4. Gerd: will continue protonix 40 mg twice daily  ' 5. Hypotension: will continue midodrine 10 mg three times daily   6. Vaginal bleeding: has endometrium thickening will follow up outpatient as indicated will monitor  7. Thrombocytopenia: plt is 56 will monitor    Time spent with patient  50  minutes >50% time spent counseling; reviewing medical record; tests; labs; and developing future plan of care   Synthia Innocent NP [facility]  Contact [phone] Monday through Friday 8am- 5pm  After hours call [phone]

## 2015-08-30 ENCOUNTER — Encounter: Payer: Self-pay | Admitting: Internal Medicine

## 2015-09-06 ENCOUNTER — Non-Acute Institutional Stay (SKILLED_NURSING_FACILITY): Payer: Medicare Other | Admitting: Family

## 2015-09-06 ENCOUNTER — Other Ambulatory Visit: Payer: Medicare Other | Admitting: Obstetrics & Gynecology

## 2015-09-06 ENCOUNTER — Telehealth: Payer: Self-pay

## 2015-09-06 DIAGNOSIS — B1910 Unspecified viral hepatitis B without hepatic coma: Secondary | ICD-10-CM | POA: Diagnosis not present

## 2015-09-06 DIAGNOSIS — R269 Unspecified abnormalities of gait and mobility: Secondary | ICD-10-CM

## 2015-09-06 DIAGNOSIS — N186 End stage renal disease: Secondary | ICD-10-CM

## 2015-09-06 DIAGNOSIS — I959 Hypotension, unspecified: Secondary | ICD-10-CM | POA: Diagnosis not present

## 2015-09-06 DIAGNOSIS — Z992 Dependence on renal dialysis: Secondary | ICD-10-CM

## 2015-09-06 DIAGNOSIS — D696 Thrombocytopenia, unspecified: Secondary | ICD-10-CM

## 2015-09-06 DIAGNOSIS — K7460 Unspecified cirrhosis of liver: Secondary | ICD-10-CM | POA: Diagnosis not present

## 2015-09-06 NOTE — Progress Notes (Signed)
Patient ID: Samantha [name], female   DOB: 1946-02-02, 70 y.o.   MRN: [id number]  Location:  [facility]   Place of Service:  SNF (31)  Provider: Richarda Blade FNP-C  PCP: Oneal Grout, MD Patient Care Team: Oneal Grout, MD as PCP - General (Internal Medicine) Terrial Rhodes, MD as Consulting Physician (Nephrology)  Extended Emergency Contact Information Primary Emergency Contact: [facility] Address: [address]          [place], [place] [address] [place] Home Phone: [phone] Work Phone: [phone] Mobile Phone: [phone] Relation: Grandson Secondary Emergency Contact: Lorane Gell [place] of [place] Home Phone: [phone] Relation: Relative  Code Status: DNR  Goals of care:  Advanced Directive information Advanced Directives 08/26/2015  Does patient have an advance directive? Yes  Type of Advance Directive Out of facility DNR (pink MOST or yellow form)  Does patient want to make changes to advanced directive? No - Patient declined  Copy of advanced directive(s) in chart? Yes     Allergies  Allergen Reactions  . Banana Other (See Comments)    Due to dialysis  . Chocolate Other (See Comments)    Due to dialysis  . Fish-Derived Products Other (See Comments)    Due to gout    Chief Complaint  Patient presents with  . Discharge Note    HPI:  70 y.o. female seen at  [facility] for discharge home. She was here for short term Rehabilitation post hospital admission 08/14/2015-08/20/2015 for hypotension with dizziness. She received IVF. She had a work -up for sepsis and cultures were negative. Her Midodrine was increased to three times daily. She also had vaginal spotting Pelvic Ultrasound showed endometrial thickening. She will follow up with Dr. Adrian Blackwater as an outpatient. She has a significant medical history of ESRD on dialysis ( Mon, Wed and Fri), hypothyroidism, liver cirrhosis, OA , anemia,  dizziness among others. She has worked well with PT/OT now stable for discharge home. She will be discharged with home health PT/OT to continue with ROM, Exercise, Gait stability and muscle strengthening. She does not require any DME. Facility staff will arrange [facility] service prior to discharge home.    Past Medical History  Diagnosis Date  . Shoulder pain   . Headache(784.0)     occasionally  . Dizziness   . Occasional numbness/prickling/tingling of fingers and toes   . Arthritis     left shoulder  . Joint pain   . Joint swelling   . Gastric ulcer   . Dry skin   . Peripheral edema   . Constipation   . Oligouria   . H/O: GI bleed   . Hepatitis     Hep B  . History of kidney stones   . History of blood transfusion   . Hypertension   . Anemia   . Hypothyroidism     takes Synthroid daily  . Impaired hearing   . Diabetes mellitus     borderline  . Renal disorder     m, w, F     Past Surgical History  Procedure Laterality Date  . Shoulder surgery  3-23yrs ago    right replacement  . Esophagogastroduodenoscopy  06/09/2011    Procedure: ESOPHAGOGASTRODUODENOSCOPY (EGD);  Surgeon: Theda Belfast, MD;  Location: [facility] ENDOSCOPY;  Service: Endoscopy;  Laterality: N/A;  . Cataract surgery      bilateral  . Reverse shoulder arthroplasty  09/22/2011    Procedure: REVERSE SHOULDER  ARTHROPLASTY;  Surgeon: Verlee Rossetti, MD;  Location: [facility] OR;  Service: Orthopedics;  Laterality: Left;  left reverse shoulder arthroplasty  . Cholecystectomy  12/26/2011  . Cholecystectomy  12/26/2011    Procedure: LAPAROSCOPIC CHOLECYSTECTOMY;  Surgeon: Atilano Ina, MD,FACS;  Location: [facility] OR;  Service: General;  Laterality: N/A;  . Arteriovenous graft placement Left     forearm  . Shuntogram Left 08/29/2012    Procedure: SHUNTOGRAM;  Surgeon: Fransisco Hertz, MD;  Location: [facility] CATH LAB;  Service: Cardiovascular;  Laterality: Left;  arm  . Revision of arteriovenous goretex graft Left 01/12/2015    Procedure:  REVISION OF Left FOREARM ARTERIOVENOUS GORETEX GRAFT;  Surgeon: Fransisco Hertz, MD;  Location: [facility] OR;  Service: Vascular;  Laterality: Left;  . Insertion of dialysis catheter Right 01/12/2015    Procedure: INSERTION OF DIALYSIS CATHETER;  Surgeon: Fransisco Hertz, MD;  Location: [facility] OR;  Service: Vascular;  Laterality: Right;  . I&d extremity Left 03/23/2015    Procedure: DRAINAGE OF LEFT ARM SEROMA;  Surgeon: Fransisco Hertz, MD;  Location: [facility] OR;  Service: Vascular;  Laterality: Left;  . Ligation arteriovenous gortex graft Left 03/23/2015    Procedure: LIGATION AND EXCISION OF LEFT ARTERIOVENOUS GORTEX GRAFT;  Surgeon: Fransisco Hertz, MD;  Location: [facility] OR;  Service: Vascular;  Laterality: Left;      reports that she has never smoked. Her smokeless tobacco use includes Chew. She reports that she does not drink alcohol or use illicit drugs. Social History   Social History  . Marital Status: Widowed    Spouse Name: N/A  . Number of Children: N/A  . Years of Education: N/A   Occupational History  . Not on file.   Social History Main Topics  . Smoking status: Never Smoker   . Smokeless tobacco: Current User    Types: Chew  . Alcohol Use: No     Comment: quit 4-75yrs ago  . Drug Use: No  . Sexual Activity: No   Other Topics Concern  . Not on file   Social History Narrative   Functional Status Survey:    Allergies  Allergen Reactions  . Banana Other (See Comments)    Due to dialysis  . Chocolate Other (See Comments)    Due to dialysis  . Fish-Derived Products Other (See Comments)    Due to gout    Pertinent  Health Maintenance Due  Topic Date Due  . MAMMOGRAM  04/17/2016 (Originally 01/13/1994)  . DEXA SCAN  04/17/2016 (Originally 01/13/2009)  . COLONOSCOPY  04/17/2016 (Originally 01/13/1994)  . PNA vac Low Risk Adult (1 of 2 - PCV13) 08/25/2016 (Originally 01/13/2009)  . INFLUENZA VACCINE  11/16/2015    Medications:   Medication List       This list is accurate as of: 09/06/15   6:05 PM.  Always use your most recent med list.               acetaminophen 500 MG tablet  Commonly known as:  TYLENOL  Take 500 mg by mouth every 6 (six) hours as needed for moderate pain.     HYDROcodone-acetaminophen 5-325 MG tablet  Commonly known as:  NORCO/VICODIN  Take 1 tablet by mouth every 6 (six) hours as needed for moderate pain.     lactulose 10 GM/15ML solution  Commonly known as:  CHRONULAC  Take 45 mLs (30 g total) by mouth 2 (two) times daily.     levothyroxine 50 MCG tablet  Commonly known as:  SYNTHROID, LEVOTHROID  Take 50 mcg by mouth daily before breakfast.     midodrine 10 MG tablet  Commonly known as:  PROAMATINE  Take 1 tablet (10 mg total) by mouth 3 (three) times daily.     pantoprazole 40 MG tablet  Commonly known as:  PROTONIX  Take 1 tablet (40 mg total) by mouth 2 (two) times daily before a meal.        Review of Systems  Constitutional: Negative for fever, chills, activity change, appetite change and fatigue.  HENT: Positive for hearing loss. Negative for congestion, ear pain, sinus pressure, sneezing and sore throat.   Eyes: Negative.   Respiratory: Negative for cough, chest tightness, shortness of breath and wheezing.   Cardiovascular: Negative for chest pain, palpitations and leg swelling.  Gastrointestinal: Negative for nausea, vomiting, abdominal pain, diarrhea, constipation and abdominal distention.  Endocrine: Negative.   Genitourinary: Negative.   Musculoskeletal: Positive for gait problem.  Skin: Negative.   Allergic/Immunologic: Negative.   Neurological: Negative.   Hematological: Negative.   Psychiatric/Behavioral: Negative for hallucinations, behavioral problems, confusion, sleep disturbance and agitation.    Filed Vitals:   09/06/15 1739  BP: 131/78  Pulse: 64  Temp: 97 F (36.1 C)  Resp: 18  Height: 5' (1.524 m)  Weight: 188 lb (85.276 kg)  SpO2: 98%   Body mass index is 36.72 kg/(m^2). Physical Exam    Constitutional: She is oriented to person, place, and time. She appears well-developed and well-nourished. No distress.  HENT:  Mouth/Throat: Oropharynx is clear and moist. No oropharyngeal exudate.  HOH   Eyes: Conjunctivae and EOM are normal. Pupils are equal, round, and reactive to light. Right eye exhibits no discharge. Left eye exhibits no discharge. No scleral icterus.  Neck: Normal range of motion. No JVD present. No thyromegaly present.  Cardiovascular: Normal rate, regular rhythm, normal heart sounds and intact distal pulses.  Exam reveals no gallop and no friction rub.   No murmur heard. Pulmonary/Chest: Effort normal and breath sounds normal. No respiratory distress. She has no wheezes. She has no rales.  Abdominal: Soft. Bowel sounds are normal. She exhibits no distension. There is no tenderness. There is no rebound and no guarding.  Musculoskeletal: Normal range of motion. She exhibits no edema or tenderness.  Unsteady gait   Lymphadenopathy:    She has no cervical adenopathy.  Neurological: She is oriented to person, place, and time.  Skin: Skin is warm and dry. No rash noted. No erythema. No pallor.  Right [place] hemodialysis access drsg dry and clean without any signs of infection noted.   Psychiatric: She has a normal mood and affect.    Labs reviewed: Basic Metabolic Panel:  Recent Labs  16/10/96 0513  08/18/15 0751 08/19/15 0320 08/20/15 08/20/15 0731  NA 135  < > 133* 136 137 137  K 4.6  < > 3.5 3.3*  --  3.4*  CL 100*  < > 102 99*  --  98*  CO2 18*  < > 21* 25  --  25  GLUCOSE 71  < > 99 100*  --  90  BUN 51*  < > 19 7 15 15   CREATININE 9.52*  < > 7.21* 4.14* 6.2* 6.16*  CALCIUM 6.6*  < > 9.0 8.6*  --  9.1  MG 2.0  --   --   --   --   --   PHOS 6.6*  < > 5.0* 2.5  --  4.4  < > =  values in this interval not displayed. Liver Function Tests:  Recent Labs  06/14/15 1833 06/15/15 0400  08/14/15 1805  08/18/15 0751 08/19/15 0320 08/20/15 0731  AST 69*  78*  --  74*  --   --   --   --   ALT 35 39  --  32  --   --   --   --   ALKPHOS 112 95  --  87  --   --   --   --   BILITOT 1.0 1.2  --  1.4*  --   --   --   --   PROT 9.1* 7.7  --  7.6  --   --   --   --   ALBUMIN 3.5 3.1*  < > 3.0*  < > 2.3* 2.2* 2.1*  < > = values in this interval not displayed.  Recent Labs  08/14/15 1805  LIPASE 141*    Recent Labs  06/14/15 2051 08/15/15 0210 08/17/15 0245  AMMONIA 73* 54* 40*   CBC:  Recent Labs  08/14/15 1805 08/15/15 0541  08/18/15 0759 08/19/15 0320 08/20/15 08/20/15 0731  WBC 3.7* 2.5*  < > 2.6* 2.0* 2.3 2.3*  NEUTROABS 1.5* 1.2*  --   --   --   --  1.3*  HGB 11.4* 13.0  < > 10.0* 8.6*  --  9.0*  HCT 34.9* 39.1  < > 29.3* 26.3*  --  26.7*  MCV 99.1 98.2  < > 97.0 99.2  --  100.4*  PLT 71* 38*  < > 61* 51*  --  56*  < > = values in this interval not displayed. Cardiac Enzymes:  Recent Labs  08/15/15 1525 08/15/15 1854 08/15/15 2100  TROPONINI 0.18* 0.15* 0.18*   BNP: Invalid input(s): POCBNP CBG:  Recent Labs  06/18/15 0831 06/19/15 0730 08/14/15 1942  GLUCAP 85 75 80    Procedures and Imaging Studies During Stay: Dg Chest 2 View  08/14/2015  CLINICAL DATA:  Hypotensive.  Diaphysis yesterday EXAM: CHEST  2 VIEW COMPARISON:  06/14/2015 FINDINGS: Right chest wall dialysis catheter is noted with tips in the right atrium. Mild cardiac enlargement. There is mild pulmonary edema. No pleural fluid identified. No airspace consolidation. Previous bilateral shoulder arthroplasty. IMPRESSION: Mild cardiac enlargement and mild interstitial edema. Electronically Signed   By: Signa Kell M.D.   On: 08/14/2015 19:02   Ct Head Wo Contrast  08/14/2015  CLINICAL DATA:  Headache, dizziness. EXAM: CT HEAD WITHOUT CONTRAST TECHNIQUE: Contiguous axial images were obtained from the base of the skull through the vertex without intravenous contrast. COMPARISON:  CT scan of June 14, 2015. FINDINGS: Bony calvarium appears intact.  Mild diffuse cortical atrophy is noted. Mild chronic ischemic white matter disease is noted. No mass effect or midline shift is noted. Ventricular size is within normal limits. There is no evidence of mass lesion, hemorrhage or acute infarction. IMPRESSION: Mild diffuse cortical atrophy. Mild chronic ischemic white matter disease. No acute intracranial abnormality seen. Electronically Signed   By: Lupita Raider, M.D.   On: 08/14/2015 20:51   Mr Brain Wo Contrast  08/17/2015  CLINICAL DATA:  Slow rate of speech and dizziness beginning a couple days ago. Personal history of end-stage renal disease and dialysis. EXAM: MRI HEAD WITHOUT CONTRAST TECHNIQUE: Multiplanar, multiecho pulse sequences of the brain and surrounding structures were obtained without intravenous contrast. COMPARISON:  CT head without contrast 08/14/2015. FINDINGS: The diffusion-weighted images demonstrate no evidence for  acute or subacute infarction. Severe diffuse cerebral and cerebellar atrophy is noted. There is mild periventricular white matter changes well. The ventricles are proportionate to the degree of atrophy. No significant extra-axial fluid collection is present. There is a remote lacunar infarct of the right cerebellum. The internal auditory canals are within normal limits bilaterally. The brainstem is unremarkable. Flow is present in the major intracranial arteries. Bilateral lens replacements are present. The globes and orbits are intact. The paranasal sinuses and mastoid air cells are clear. The skullbase is within normal limits. Midline sagittal images of the intracranial structures are unremarkable. Multilevel degenerate changes are present within the cervical spine. IMPRESSION: 1. No acute intracranial abnormality. 2. Severe diffuse cerebral and cerebellar atrophy with mild white matter disease. This likely reflects the sequela of chronic microvascular ischemia. 3. Remote lacunar infarct of the right cerebellum. 4. Multilevel  degenerative changes in the cervical spine. Electronically Signed   By: Marin Robertshristopher  Mattern M.D.   On: 08/17/2015 12:10   [place] Pelvis Complete  08/15/2015  CLINICAL DATA:  Vaginal bleeding EXAM: TRANSABDOMINAL ULTRASOUND OF PELVIS TECHNIQUE: Transabdominal ultrasound examination of the pelvis was performed including evaluation of the uterus, ovaries, adnexal regions, and pelvic cul-de-sac. COMPARISON:  None. FINDINGS: Uterus Measurements: 8.3 x 4.2 x 5.6 cm. No fibroids or other mass visualized. Endometrium Thickness: 16.7 mm.  Heterogeneous in appearance. Right ovary Measurements: 2.2 x 1.3 x 2.0 cm. Normal appearance/no adnexal mass. Left ovary Measurements: 3.4 x 2.2 x 2.5 cm. Normal appearance/no adnexal mass. Other findings:  No abnormal free fluid. IMPRESSION: 1. The endometrium is thickened measuring 16.7 mm. In the setting of post-menopausal bleeding, endometrial sampling is indicated to exclude carcinoma. If results are benign, sonohysterogram should be considered for focal lesion work-up. (Ref: Radiological Reasoning: Algorithmic Workup of Abnormal Vaginal Bleeding with Endovaginal Sonography and Sonohysterography. AJR 2008; 454:U98-11; 191:S68-73) Electronically Signed   By: Signa Kellaylor  Stroud M.D.   On: 08/15/2015 15:49    Assessment/Plan:   1. Abnormality of gait Discharged with home health PT/OT to continue with ROM, Exercise, Gait stability and muscle strengthening.Fall and safety precautions. No DME required.   2. 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   3. ESRD on dialysis (HCC)  on Hemodialysis M-W-F   4. Hypotension, unspecified hypotension type Has improved. Continue on Midodrine 10 mg Tablet  5. Thrombocytopenia (HCC) Recent Plts 56. Follow up with PCP in 1-2 weeks to recheck CBC    Patient is being discharged with the following home health services:    PT/OT to continue with ROM, Exercise, Gait stability and muscle strengthening.  Patient  is being discharged with the following durable medical equipment:   No DME required.   Rx written X 1 month supply.    Patient has been advised to f/u with their PCP in 1-2 weeks to bring them up to date on their rehab stay.  Social services at facility was responsible for arranging this appointment.  Pt was provided with a 30 day supply of prescriptions for medications and refills must be obtained from their PCP.  For controlled substances, a more limited supply may be provided adequate until PCP appointment only.  Future labs/tests needed:CBC, BMP in 1-2 weeks with PCP

## 2015-09-06 NOTE — Telephone Encounter (Signed)
Called patient and left message (with the female that answered) regarding her missed appt today on 5/22. Told them to call and reschedule for a new appt. He verbalized understanding and stated he would get someone to reschedule.

## 2015-09-29 ENCOUNTER — Other Ambulatory Visit: Payer: Medicare Other | Admitting: Obstetrics and Gynecology

## 2015-11-08 ENCOUNTER — Encounter (HOSPITAL_COMMUNITY): Payer: Self-pay | Admitting: Emergency Medicine

## 2015-11-08 ENCOUNTER — Emergency Department (HOSPITAL_COMMUNITY)
Admission: EM | Admit: 2015-11-08 | Discharge: 2015-11-08 | Disposition: A | Discharge status: Home / Self Care | Payer: Medicare Other | Attending: Emergency Medicine | Admitting: Emergency Medicine

## 2015-11-08 DIAGNOSIS — N186 End stage renal disease: Secondary | ICD-10-CM | POA: Insufficient documentation

## 2015-11-08 DIAGNOSIS — I10 Essential (primary) hypertension: Secondary | ICD-10-CM

## 2015-11-08 DIAGNOSIS — Z96611 Presence of right artificial shoulder joint: Secondary | ICD-10-CM | POA: Insufficient documentation

## 2015-11-08 DIAGNOSIS — I120 Hypertensive chronic kidney disease with stage 5 chronic kidney disease or end stage renal disease: Secondary | ICD-10-CM | POA: Insufficient documentation

## 2015-11-08 DIAGNOSIS — E039 Hypothyroidism, unspecified: Secondary | ICD-10-CM | POA: Insufficient documentation

## 2015-11-08 LAB — BASIC METABOLIC PANEL
Anion gap: 11 (ref 5–15)
BUN: 5 mg/dL — AB (ref 6–20)
CALCIUM: 7.9 mg/dL — AB (ref 8.9–10.3)
CO2: 26 mmol/L (ref 22–32)
Chloride: 99 mmol/L — ABNORMAL LOW (ref 101–111)
Creatinine, Ser: 3.32 mg/dL — ABNORMAL HIGH (ref 0.44–1.00)
GFR calc Af Amer: 15 mL/min — ABNORMAL LOW (ref >60)
GFR, EST NON AFRICAN AMERICAN: 13 mL/min — AB (ref >60)
GLUCOSE: 72 mg/dL (ref 65–99)
Potassium: 3.3 mmol/L — ABNORMAL LOW (ref 3.5–5.1)
Sodium: 136 mmol/L (ref 135–145)

## 2015-11-08 LAB — CBC
HEMATOCRIT: 35.9 % — AB (ref 36.0–46.0)
Hemoglobin: 11.3 g/dL — ABNORMAL LOW (ref 12.0–15.0)
MCH: 30.9 pg (ref 26.0–34.0)
MCHC: 31.5 g/dL (ref 30.0–36.0)
MCV: 98.1 fL (ref 78.0–100.0)
PLATELETS: 71 10*3/uL — AB (ref 150–400)
RBC: 3.66 MIL/uL — ABNORMAL LOW (ref 3.87–5.11)
RDW: 17.5 % — AB (ref 11.5–15.5)
WBC: 2.3 10*3/uL — ABNORMAL LOW (ref 4.0–10.5)

## 2015-11-08 MED ORDER — AMLODIPINE BESYLATE 10 MG PO TABS: 10.0000 mg | ORAL_TABLET | Freq: Every day | ORAL | 1 refills | Status: DC

## 2015-11-08 MED ORDER — AMLODIPINE BESYLATE 5 MG PO TABS
5.0000 mg | ORAL_TABLET | Freq: Once | ORAL | Status: AC
  Administered 2015-11-08: 5 mg via ORAL
  Filled 2015-11-08: qty 1

## 2015-11-08 NOTE — ED Provider Notes (Signed)
[facility]-EMERGENCY DEPT Provider Note   CSN: [id number] Arrival date & time: 11/08/15  1609  First Provider Contact:  First MD Initiated Contact with Patient 11/08/15 1619        History   Chief Complaint Chief Complaint  Patient presents with  . Hypertension    HPI Samantha [name] is a 70 y.o. female.  Patient presents to the emergency room for evaluation of hypertension. The patient has end-stage renal disease.  She is not currently taking any antihypertensive agents and is prescribed Midodrine for low blood pressure. Patient went to dialysis today and was noted to have blood pressures elevated above 200. Before dialysis her blood pressure was 192/133 . She received dialysis and it was still elevated at 239/145. She continued to remain hypertensive despite dialysis with additional fluid removal. The patient was sent to the emergency room for further evaluation. She denies any symptoms associated with this. She states she feels fine and thought she was going to be able to go home after dialysis.   The history is provided by the patient.  Hypertension  This is a new problem. The problem has been gradually improving. Pertinent negatives include no chest pain, no abdominal pain, no headaches and no shortness of breath. Nothing aggravates the symptoms. Nothing relieves the symptoms.    Past Medical History:  Diagnosis Date  . Anemia   . Arthritis    left shoulder  . Constipation   . Diabetes mellitus    borderline  . Dizziness   . Dry skin   . Gastric ulcer   . H/O: GI bleed   . Headache(784.0)    occasionally  . Hepatitis    Hep B  . History of blood transfusion   . History of kidney stones   . Hypertension   . Hypothyroidism    takes Synthroid daily  . Impaired hearing   . Joint pain   . Joint swelling   . Occasional numbness/prickling/tingling of fingers and toes   . Oligouria   . Peripheral edema   . Renal disorder    m, w, F   . Shoulder pain     Patient  Active Problem List   Diagnosis Date Noted  . Vaginal bleeding 08/15/2015  . Adnexal mass   . ESRD on dialysis (HCC)   . Lactic acidosis   . Neutropenia (HCC) 06/15/2015  . Acute encephalopathy 06/14/2015  . Sepsis (HCC) 06/14/2015  . 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 Arteriovenous graft infection (HCC)   . Gram-negative bacteremia (HCC)   . Cirrhosis of liver (HCC) 12/26/2014  . Thrombocytopenia (HCC) 12/25/2014  . 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 Cardiomegaly 12/25/2014  . Bacteremia 12/25/2014  . Cellulitis of left upper extremity   . Mechanical complication of other vascular device, implant, and graft 10/08/2013  . End stage renal disease (HCC) 01/16/2013  . Hyponatremia 04/12/2012  . Pancreatitis 04/10/2012  . Hypotension 04/09/2012  . SOB (shortness of breath) 04/08/2012  . Abdominal pain 04/08/2012  . Symptomatic cholelithiasis 11/16/2011  . Acute blood loss anemia 06/08/2011  . Transaminitis 06/08/2011  . Hepatitis B antibody positive 06/08/2011  . GI bleed 06/06/2011  . ESRD on hemodialysis (HCC) 06/06/2011  . HTN (hypertension) 06/06/2011  . Shoulder pain 06/06/2011  . RUQ abdominal tenderness 06/06/2011  . Anemia in chronic kidney disease (CKD) 06/06/2011    Past Surgical History:  Procedure Laterality Date  . ARTERIOVENOUS GRAFT PLACEMENT Left    forearm  .  cataract surgery     bilateral  . CHOLECYSTECTOMY  12/26/2011  . CHOLECYSTECTOMY  12/26/2011   Procedure: LAPAROSCOPIC CHOLECYSTECTOMY;  Surgeon: Atilano Ina, MD,FACS;  Location: [facility] OR;  Service: General;  Laterality: N/A;  . ESOPHAGOGASTRODUODENOSCOPY  06/09/2011   Procedure: ESOPHAGOGASTRODUODENOSCOPY (EGD);  Surgeon: Theda Belfast, MD;  Location: [facility] ENDOSCOPY;  Service: Endoscopy;  Laterality: N/A;  . I&D EXTREMITY Left 03/23/2015   Procedure: DRAINAGE OF LEFT ARM SEROMA;  Surgeon: Fransisco Hertz, MD;  Location: [facility] OR;  Service: Vascular;  Laterality:  Left;  . INSERTION OF DIALYSIS CATHETER Right 01/12/2015   Procedure: INSERTION OF DIALYSIS CATHETER;  Surgeon: Fransisco Hertz, MD;  Location: [facility] OR;  Service: Vascular;  Laterality: Right;  . LIGATION ARTERIOVENOUS GORTEX GRAFT Left 03/23/2015   Procedure: LIGATION AND EXCISION OF LEFT ARTERIOVENOUS GORTEX GRAFT;  Surgeon: Fransisco Hertz, MD;  Location: [facility] OR;  Service: Vascular;  Laterality: Left;  . REVERSE SHOULDER ARTHROPLASTY  09/22/2011   Procedure: REVERSE SHOULDER ARTHROPLASTY;  Surgeon: Verlee Rossetti, MD;  Location: [facility] OR;  Service: Orthopedics;  Laterality: Left;  left reverse shoulder arthroplasty  . REVISION OF ARTERIOVENOUS GORETEX GRAFT Left 01/12/2015   Procedure: REVISION OF Left FOREARM ARTERIOVENOUS GORETEX GRAFT;  Surgeon: Fransisco Hertz, MD;  Location: [facility] OR;  Service: Vascular;  Laterality: Left;  . SHOULDER SURGERY  3-55yrs ago   right replacement  . SHUNTOGRAM Left 08/29/2012   Procedure: SHUNTOGRAM;  Surgeon: Fransisco Hertz, MD;  Location: [facility] CATH LAB;  Service: Cardiovascular;  Laterality: Left;  arm    OB History    No data available       Home Medications    Prior to Admission medications   Medication Sig Start Date End Date Taking? Authorizing Provider  acetaminophen (TYLENOL) 500 MG tablet Take 500 mg by mouth every 6 (six) hours as needed for moderate pain.    Historical Provider, MD  amLODipine (NORVASC) 10 MG tablet Take 1 tablet (10 mg total) by mouth daily. 11/08/15   Linwood Dibbles, MD  HYDROcodone-acetaminophen (NORCO/VICODIN) 5-325 MG tablet Take 1 tablet by mouth every 6 (six) hours as needed for moderate pain. 08/20/15   Costin Otelia Sergeant, MD  lactulose (CHRONULAC) 10 GM/15ML solution Take 45 mLs (30 g total) by mouth 2 (two) times daily. 06/18/15   Rolly Salter, MD  levothyroxine (SYNTHROID, LEVOTHROID) 50 MCG tablet Take 50 mcg by mouth daily before breakfast.  04/15/15   Historical Provider, MD  pantoprazole (PROTONIX) 40 MG tablet Take 1 tablet (40 mg total) by  mouth 2 (two) times daily before a meal. 06/18/15   Rolly Salter, MD    Family History Family History  Problem Relation Age of Onset  . Hypertension Mother   . Diabetes Mother   . Cancer Brother     Social History Social History  Substance Use Topics  . Smoking status: Never Smoker  . Smokeless tobacco: Current User    Types: Chew  . Alcohol use No     Comment: quit 4-97yrs ago     Allergies   Banana; Chocolate; and Fish-derived products   Review of Systems Review of Systems  Respiratory: Negative for shortness of breath.   Cardiovascular: Negative for chest pain.  Gastrointestinal: Negative for abdominal pain.  Neurological: Negative for headaches.  All other systems reviewed and are negative.    Physical Exam Updated Vital Signs BP (!) 183/102   Pulse 90   Temp 98.2 F (36.8 C) (  Oral)   Resp 17   SpO2 99%   Physical Exam  Constitutional: No distress.  elderly, overweight  HENT:  Head: Normocephalic and atraumatic.  Right Ear: External ear normal.  Left Ear: External ear normal.  Eyes: Conjunctivae are normal. Right eye exhibits no discharge. Left eye exhibits no discharge. No scleral icterus.  Neck: Neck supple. No tracheal deviation present.  Cardiovascular: Normal rate, regular rhythm and intact distal pulses.   Pulmonary/Chest: Effort normal and breath sounds normal. No stridor. No respiratory distress. She has no wheezes. She has no rales.  Port-A-Cath right anterior subclavian region, no erythema  Abdominal: Soft. Bowel sounds are normal. She exhibits no distension. There is no tenderness. There is no rebound and no guarding.  Musculoskeletal: She exhibits no edema or tenderness.  Neurological: She is alert. She has normal strength. No cranial nerve deficit (no facial droop, extraocular movements intact, no slurred speech) or sensory deficit. She exhibits normal muscle tone. She displays no seizure activity. Coordination normal.  Skin: Skin is warm  and dry. No rash noted.  Psychiatric: She has a normal mood and affect.  Nursing note and vitals reviewed.    ED Treatments / Results  Labs (all labs ordered are listed, but only abnormal results are displayed) Labs Reviewed  CBC - Abnormal; Notable for the following:       Result Value   WBC 2.3 (*)    RBC 3.66 (*)    Hemoglobin 11.3 (*)    HCT 35.9 (*)    RDW 17.5 (*)    Platelets 71 (*)    All other components within normal limits  BASIC METABOLIC PANEL - Abnormal; Notable for the following:    Potassium 3.3 (*)    Chloride 99 (*)    BUN 5 (*)    Creatinine, Ser 3.32 (*)    Calcium 7.9 (*)    GFR calc non Af Amer 13 (*)    GFR calc Af Amer 15 (*)    All other components within normal limits    EKG  EKG Interpretation None       Radiology No results found.  Procedures Procedures (including critical care time)  Medications Ordered in ED Medications  amLODipine (NORVASC) tablet 5 mg (5 mg Oral Given 11/08/15 1804)     Initial Impression / Assessment and Plan / ED Course  I have reviewed the triage vital signs and the nursing notes.  Pertinent labs & imaging results that were available during my care of the patient were reviewed by me and considered in my medical decision making (see chart for details).  Clinical Course  Comment By Time  Labs reviewed.  Similar to previous values.  No acute changes Linwood Dibbles, MD 07/24 1850  Given noravasc.  BP 190/100s.  Still asymptomatic.  Will consult nephrology to discuss BP as pt has had trouble with hypotension in the past.  Possibly just hold the midodrin vs starting a new BP med Linwood Dibbles, MD 07/24 1850  Discussed with Dr Lowell Guitar.  Most recent BP is down in the 160s.  Will dc home on norvasc 10 mg Linwood Dibbles, MD 07/24 1909    BP improved with treatment.  Pt remains asymptomatic.  Will dc home with oral norvasc.  Stop the midodrine  Final Clinical Impressions(s) / ED Diagnoses   Final diagnoses:  Essential  hypertension    New Prescriptions New Prescriptions   AMLODIPINE (NORVASC) 10 MG TABLET    Take 1 tablet (10 mg  total) by mouth daily.     Linwood Dibbles, MD 11/08/15 Barry Brunner

## 2015-11-08 NOTE — ED Notes (Signed)
Report given to Nikki RN

## 2015-11-08 NOTE — ED Triage Notes (Signed)
EMS - Patient coming from dialysis with c/o of hypertension, before dialysis 192/133 and after dialysis 239/145 at facility.  Patient received full treatment of dialysis and had additional fluid removed.  Patient is very hard of hearing.  Denies chest pain, SOB, n/v/d.

## 2016-04-25 ENCOUNTER — Encounter (HOSPITAL_COMMUNITY): Payer: Self-pay

## 2016-04-25 ENCOUNTER — Emergency Department (HOSPITAL_COMMUNITY)
Admission: EM | Admit: 2016-04-25 | Discharge: 2016-04-26 | Disposition: A | Discharge status: Home / Self Care | Payer: Medicare Other | Attending: Emergency Medicine | Admitting: Emergency Medicine

## 2016-04-25 DIAGNOSIS — W19XXXA Unspecified fall, initial encounter: Secondary | ICD-10-CM | POA: Insufficient documentation

## 2016-04-25 DIAGNOSIS — E1122 Type 2 diabetes mellitus with diabetic chronic kidney disease: Secondary | ICD-10-CM | POA: Diagnosis not present

## 2016-04-25 DIAGNOSIS — Y939 Activity, unspecified: Secondary | ICD-10-CM | POA: Diagnosis not present

## 2016-04-25 DIAGNOSIS — N186 End stage renal disease: Secondary | ICD-10-CM | POA: Diagnosis not present

## 2016-04-25 DIAGNOSIS — R109 Unspecified abdominal pain: Secondary | ICD-10-CM | POA: Diagnosis present

## 2016-04-25 DIAGNOSIS — E039 Hypothyroidism, unspecified: Secondary | ICD-10-CM | POA: Insufficient documentation

## 2016-04-25 DIAGNOSIS — M545 Low back pain, unspecified: Secondary | ICD-10-CM

## 2016-04-25 DIAGNOSIS — R52 Pain, unspecified: Secondary | ICD-10-CM

## 2016-04-25 DIAGNOSIS — Y999 Unspecified external cause status: Secondary | ICD-10-CM | POA: Insufficient documentation

## 2016-04-25 DIAGNOSIS — I120 Hypertensive chronic kidney disease with stage 5 chronic kidney disease or end stage renal disease: Secondary | ICD-10-CM | POA: Insufficient documentation

## 2016-04-25 DIAGNOSIS — Z992 Dependence on renal dialysis: Secondary | ICD-10-CM | POA: Insufficient documentation

## 2016-04-25 DIAGNOSIS — R1084 Generalized abdominal pain: Secondary | ICD-10-CM

## 2016-04-25 DIAGNOSIS — Z96611 Presence of right artificial shoulder joint: Secondary | ICD-10-CM | POA: Insufficient documentation

## 2016-04-25 DIAGNOSIS — Y929 Unspecified place or not applicable: Secondary | ICD-10-CM | POA: Diagnosis not present

## 2016-04-25 LAB — COMPREHENSIVE METABOLIC PANEL
ALBUMIN: 3 g/dL — AB (ref 3.5–5.0)
ALT: 27 U/L (ref 14–54)
AST: 52 U/L — AB (ref 15–41)
Alkaline Phosphatase: 142 U/L — ABNORMAL HIGH (ref 38–126)
Anion gap: 12 (ref 5–15)
BILIRUBIN TOTAL: 0.8 mg/dL (ref 0.3–1.2)
BUN: 22 mg/dL — AB (ref 6–20)
CHLORIDE: 99 mmol/L — AB (ref 101–111)
CO2: 23 mmol/L (ref 22–32)
CREATININE: 7.27 mg/dL — AB (ref 0.44–1.00)
Calcium: 9.2 mg/dL (ref 8.9–10.3)
GFR calc Af Amer: 6 mL/min — ABNORMAL LOW (ref >60)
GFR calc non Af Amer: 5 mL/min — ABNORMAL LOW (ref >60)
Glucose, Bld: 103 mg/dL — ABNORMAL HIGH (ref 65–99)
POTASSIUM: 3.4 mmol/L — AB (ref 3.5–5.1)
Sodium: 134 mmol/L — ABNORMAL LOW (ref 135–145)
TOTAL PROTEIN: 7.6 g/dL (ref 6.5–8.1)

## 2016-04-25 LAB — CBC
HEMATOCRIT: 32.9 % — AB (ref 36.0–46.0)
Hemoglobin: 11.2 g/dL — ABNORMAL LOW (ref 12.0–15.0)
MCH: 31.6 pg (ref 26.0–34.0)
MCHC: 34 g/dL (ref 30.0–36.0)
MCV: 92.9 fL (ref 78.0–100.0)
PLATELETS: 82 10*3/uL — AB (ref 150–400)
RBC: 3.54 MIL/uL — ABNORMAL LOW (ref 3.87–5.11)
RDW: 18.4 % — AB (ref 11.5–15.5)
WBC: 3.4 10*3/uL — ABNORMAL LOW (ref 4.0–10.5)

## 2016-04-25 LAB — LIPASE, BLOOD: Lipase: 91 U/L — ABNORMAL HIGH (ref 11–51)

## 2016-04-25 NOTE — ED Triage Notes (Signed)
Pt from home with LLQ abd pain radiating to the left flank. Pt had dialysis full treatment yesterday. Pt admits to vomiting 3 days ago. Pt has been seen multiple times for abd pain.

## 2016-04-26 ENCOUNTER — Emergency Department (HOSPITAL_COMMUNITY): Payer: Medicare Other

## 2016-04-26 DIAGNOSIS — N186 End stage renal disease: Secondary | ICD-10-CM | POA: Diagnosis not present

## 2016-04-26 NOTE — Discharge Instructions (Signed)

## 2016-04-26 NOTE — ED Provider Notes (Signed)
[facility]-EMERGENCY DEPT Provider Note   CSN: [id number] Arrival date & time: 04/25/16  2000   By signing my name below, I, Clovis Pu, attest that this documentation has been prepared under the direction and in the presence of Zadie Rhine, MD  Electronically Signed: Clovis Pu, ED Scribe. 04/26/16. 12:22 AM.   History   Chief Complaint Chief Complaint  Patient presents with  . Abdominal Pain    The history is provided by the patient and a relative. No language interpreter was used.  Abdominal Pain   This is a chronic problem. The current episode started more than 1 week ago. The problem has not changed since onset.The pain is associated with an unknown factor. The pain is moderate. Pertinent negatives include fever, diarrhea, nausea and vomiting. The symptoms are aggravated by certain positions. Nothing relieves the symptoms.  Back Pain   This is a new problem. The current episode started more than 2 days ago. The problem has not changed since onset.The pain is associated with falling. The pain is present in the lumbar spine. The pain is moderate. The symptoms are aggravated by certain positions. Associated symptoms include abdominal pain. Pertinent negatives include no fever. She has tried nothing for the symptoms.   HPI Comments:  Samantha [name] is a 71 y.o. female, with a hx of chronic abdominal pain x 3 years, DM, HTN, ESRD, and PSHx of cholecystectomy, who presents to the Emergency Department complaining of persistent abdominal pain x 3 years. Pt also complains of back pain s/p a fall which occurred 3 days ago. Grandson states the pt recently told him she was having trouble breathing and chest pain. He notes the pt is fine during the day but becomes confused and experiences pain at night.  He suspects sundowning.  He also reports the pt has difficulty making urine as she is on dialysis. Pt is a dialysis patient and dialyses on M/W/F. He denies vomiting yesterday and pt denies  diarrhea.  Lucila Maine provides most of history as she is severely hard of hearing  Past Medical History:  Diagnosis Date  . Anemia   . Arthritis    left shoulder  . Constipation   . Diabetes mellitus    borderline  . Dizziness   . Dry skin   . Gastric ulcer   . H/O: GI bleed   . Headache(784.0)    occasionally  . Hepatitis    Hep B  . History of blood transfusion   . History of kidney stones   . Hypertension   . Hypothyroidism    takes Synthroid daily  . Impaired hearing   . Joint pain   . Joint swelling   . Occasional numbness/prickling/tingling of fingers and toes   . Oligouria   . Peripheral edema   . Renal disorder    m, w, F   . Shoulder pain     Patient Active Problem List   Diagnosis Date Noted  . Vaginal bleeding 08/15/2015  . Adnexal mass   . ESRD on dialysis (HCC)   . Lactic acidosis   . Neutropenia (HCC) 06/15/2015  . Acute encephalopathy 06/14/2015  . Sepsis (HCC) 06/14/2015  . 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 Arteriovenous graft infection (HCC)   . Gram-negative bacteremia   . Cirrhosis of liver (HCC) 12/26/2014  . Thrombocytopenia (HCC) 12/25/2014  . 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 Cardiomegaly 12/25/2014  . Bacteremia 12/25/2014  . Cellulitis of left upper extremity   .  Mechanical complication of other vascular device, implant, and graft 10/08/2013  . End stage renal disease (HCC) 01/16/2013  . Hyponatremia 04/12/2012  . Pancreatitis 04/10/2012  . Hypotension 04/09/2012  . SOB (shortness of breath) 04/08/2012  . Abdominal pain 04/08/2012  . Symptomatic cholelithiasis 11/16/2011  . Acute blood loss anemia 06/08/2011  . Transaminitis 06/08/2011  . Hepatitis B antibody positive 06/08/2011  . GI bleed 06/06/2011  . ESRD on hemodialysis (HCC) 06/06/2011  . HTN (hypertension) 06/06/2011  . Shoulder pain 06/06/2011  . RUQ abdominal tenderness 06/06/2011  . Anemia in chronic kidney disease  (CKD) 06/06/2011    Past Surgical History:  Procedure Laterality Date  . ARTERIOVENOUS GRAFT PLACEMENT Left    forearm  . cataract surgery     bilateral  . CHOLECYSTECTOMY  12/26/2011  . CHOLECYSTECTOMY  12/26/2011   Procedure: LAPAROSCOPIC CHOLECYSTECTOMY;  Surgeon: Atilano Ina, MD,FACS;  Location: [facility] OR;  Service: General;  Laterality: N/A;  . ESOPHAGOGASTRODUODENOSCOPY  06/09/2011   Procedure: ESOPHAGOGASTRODUODENOSCOPY (EGD);  Surgeon: Theda Belfast, MD;  Location: [facility] ENDOSCOPY;  Service: Endoscopy;  Laterality: N/A;  . I&D EXTREMITY Left 03/23/2015   Procedure: DRAINAGE OF LEFT ARM SEROMA;  Surgeon: Fransisco Hertz, MD;  Location: [facility] OR;  Service: Vascular;  Laterality: Left;  . INSERTION OF DIALYSIS CATHETER Right 01/12/2015   Procedure: INSERTION OF DIALYSIS CATHETER;  Surgeon: Fransisco Hertz, MD;  Location: [facility] OR;  Service: Vascular;  Laterality: Right;  . LIGATION ARTERIOVENOUS GORTEX GRAFT Left 03/23/2015   Procedure: LIGATION AND EXCISION OF LEFT ARTERIOVENOUS GORTEX GRAFT;  Surgeon: Fransisco Hertz, MD;  Location: [facility] OR;  Service: Vascular;  Laterality: Left;  . REVERSE SHOULDER ARTHROPLASTY  09/22/2011   Procedure: REVERSE SHOULDER ARTHROPLASTY;  Surgeon: Verlee Rossetti, MD;  Location: [facility] OR;  Service: Orthopedics;  Laterality: Left;  left reverse shoulder arthroplasty  . REVISION OF ARTERIOVENOUS GORETEX GRAFT Left 01/12/2015   Procedure: REVISION OF Left FOREARM ARTERIOVENOUS GORETEX GRAFT;  Surgeon: Fransisco Hertz, MD;  Location: [facility] OR;  Service: Vascular;  Laterality: Left;  . SHOULDER SURGERY  3-49yrs ago   right replacement  . SHUNTOGRAM Left 08/29/2012   Procedure: SHUNTOGRAM;  Surgeon: Fransisco Hertz, MD;  Location: [facility] CATH LAB;  Service: Cardiovascular;  Laterality: Left;  arm    OB History    No data available       Home Medications    Prior to Admission medications   Medication Sig Start Date End Date Taking? Authorizing Provider  acetaminophen (TYLENOL) 500 MG tablet  Take 500 mg by mouth every 6 (six) hours as needed for moderate pain.    Historical Provider, MD  amLODipine (NORVASC) 10 MG tablet Take 1 tablet (10 mg total) by mouth daily. 11/08/15   Linwood Dibbles, MD  HYDROcodone-acetaminophen (NORCO/VICODIN) 5-325 MG tablet Take 1 tablet by mouth every 6 (six) hours as needed for moderate pain. 08/20/15   Costin Otelia Sergeant, MD  lactulose (CHRONULAC) 10 GM/15ML solution Take 45 mLs (30 g total) by mouth 2 (two) times daily. 06/18/15   Rolly Salter, MD  levothyroxine (SYNTHROID, LEVOTHROID) 50 MCG tablet Take 50 mcg by mouth daily before breakfast.  04/15/15   Historical Provider, MD  pantoprazole (PROTONIX) 40 MG tablet Take 1 tablet (40 mg total) by mouth 2 (two) times daily before a meal. 06/18/15   Rolly Salter, MD    Family History Family History  Problem Relation Age of Onset  . Hypertension Mother   .  Diabetes Mother   . Cancer Brother     Social History Social History  Substance Use Topics  . Smoking status: Never Smoker  . Smokeless tobacco: Current User    Types: Chew  . Alcohol use No     Comment: quit 4-83yrs ago     Allergies   Banana; Chocolate; and Fish-derived products   Review of Systems Review of Systems  Constitutional: Negative for fever.  Gastrointestinal: Positive for abdominal pain. Negative for diarrhea, nausea and vomiting.  Musculoskeletal: Positive for back pain.  All other systems reviewed and are negative.  Physical Exam Updated Vital Signs BP 99/60 (BP Location: Right Arm)   Pulse 88   Temp 97.6 F (36.4 C) (Oral)   Resp 16   Ht 5\' 3"  (1.6 m)   Wt 81.6 kg   SpO2 100%   BMI 31.89 kg/m   Physical Exam CONSTITUTIONAL: Elderly and frail, no acute distress - hard of hearing HEAD: Normocephalic/atraumatic EYES: EOMI/PERRL ENMT: Mucous membranes moist NECK: supple no meningeal signs SPINE/BACK:diffuse lumbar tenderness, No bruising/crepitance/stepoffs noted to spine CV: S1/S2 noted, no murmurs/rubs/gallops  noted LUNGS: Lungs are clear to auscultation bilaterally, no apparent distress ABDOMEN: soft, nontender, no rebound or guarding, bowel sounds noted throughout abdomen GU:no cva tenderness NEURO: Pt is awake/alert/appropriate, moves all extremitiesx4.  EXTREMITIES:  full ROM. No focal tenderness or deformities to extremities noted.  SKIN: warm, color normal. Dialysis catheter to right chest   ED Treatments / Results  DIAGNOSTIC STUDIES:  Oxygen Saturation is 100% on RA, normal by my interpretation.    COORDINATION OF CARE:  12:18 AM Discussed treatment plan with pt at bedside and pt agreed to plan.  Labs (all labs ordered are listed, but only abnormal results are displayed) Labs Reviewed  LIPASE, BLOOD - Abnormal; Notable for the following:       Result Value   Lipase 91 (*)    All other components within normal limits  COMPREHENSIVE METABOLIC PANEL - Abnormal; Notable for the following:    Sodium 134 (*)    Potassium 3.4 (*)    Chloride 99 (*)    Glucose, Bld 103 (*)    BUN 22 (*)    Creatinine, Ser 7.27 (*)    Albumin 3.0 (*)    AST 52 (*)    Alkaline Phosphatase 142 (*)    GFR calc non Af Amer 5 (*)    GFR calc Af Amer 6 (*)    All other components within normal limits  CBC - Abnormal; Notable for the following:    WBC 3.4 (*)    RBC 3.54 (*)    Hemoglobin 11.2 (*)    HCT 32.9 (*)    RDW 18.4 (*)    Platelets 82 (*)    All other components within normal limits    EKG  EKG Interpretation  Date/Time:  Wednesday April 26 2016 00:32:17 EST Ventricular Rate:  83 PR Interval:    QRS Duration: 122 QT Interval:  475 QTC Calculation: 565 R Axis:   32 Text Interpretation:  Sinus rhythm Ventricular premature complex Nonspecific intraventricular conduction delay Borderline T abnormalities, anterior leads Interpretation limited secondary to artifact Confirmed by Bebe Shaggy  MD, Shantana Christon ([id number]) on 04/26/2016 12:49:01 AM       Radiology Dg Lumbar Spine  Complete  Result Date: 04/26/2016 CLINICAL DATA:  Acute onset of lower abdominal pain and lower back pain. Initial encounter. EXAM: LUMBAR SPINE - COMPLETE 4+ VIEW COMPARISON:  CT of the abdomen and  pelvis from 06/14/2015 FINDINGS: There is no evidence of fracture or subluxation. Vertebral bodies demonstrate normal height and alignment. Intervertebral disc spaces are preserved. The visualized neural foramina are grossly unremarkable in appearance. Endplate sclerotic change is noted at L2-L3, with appearance suggestive of prior discitis on the prior CT. Endplate irregularity has improved. The visualized bowel gas pattern is unremarkable in appearance; air and stool are noted within the colon. The sacroiliac joints are within normal limits. Clips are noted within the right upper quadrant, reflecting prior cholecystectomy. IMPRESSION: No evidence of acute fracture or subluxation along the lumbar spine. Endplate sclerotic changes at L2-L3 again noted, possibly reflecting remote sequelae of discitis, with slight improvement in endplate irregularity since 2017. Electronically Signed   By: Roanna RaiderJeffery  Chang M.D.   On: 04/26/2016 01:11   Dg Abd Acute W/chest  Result Date: 04/26/2016 CLINICAL DATA:  Acute onset of lower abdominal pain and lower back pain. Initial encounter. EXAM: DG ABDOMEN ACUTE W/ 1V CHEST COMPARISON:  Chest radiograph performed 08/14/2015, and CT of the abdomen and pelvis performed 06/14/2015 FINDINGS: The lungs are well-aerated. Peribronchial thickening is noted. There is no evidence of focal opacification, pleural effusion or pneumothorax. The cardiomediastinal silhouette is within normal limits. A right-sided dual-lumen catheter is noted ending at the right atrium. The visualized bowel gas pattern is unremarkable. Scattered stool and air are seen within the colon; there is no evidence of small bowel dilatation to suggest obstruction. No free intra-abdominal air is identified on the provided upright  view. No acute osseous abnormalities are seen; the sacroiliac joints are unremarkable in appearance. Bilateral shoulder arthroplasties are partially imaged. Clips are noted within the right upper quadrant, reflecting prior cholecystectomy. IMPRESSION: 1. Unremarkable bowel gas pattern; no free intra-abdominal air seen. Moderate amount of stool noted in the colon. 2. Peribronchial thickening noted.  Lungs otherwise clear. Electronically Signed   By: Roanna RaiderJeffery  Chang M.D.   On: 04/26/2016 01:07    Procedures Procedures (including critical care time)  Medications Ordered in ED Medications - No data to display   Initial Impression / Assessment and Plan / ED Course  I have reviewed the triage vital signs and the nursing notes.  Pertinent labs & imaging results that were available during my care of the patient were reviewed by me and considered in my medical decision making (see chart for details).  Clinical Course     Pt stable in the ED Per grandson, she has had episodes of abdominal pain for years, similar to tonight's episode.  Imaging negative.  No vomiting here.  abd soft/nontender on exam.  Labs near baseline.  I don't feel further workup necessary  As for back pain, no acute fracture noted.  No other signs of trauma from recent fall  Pt currently stable/awake/alert.  She is hard of hearing which limits exam but otherwise well.  Not septic appearing.  No signs of acute abdominal emergency   Will d/c home in care of grandson.  Pt appears well cared for  She can continue dialysis this week  Final Clinical Impressions(s) / ED Diagnoses   Final diagnoses:  Pain  Generalized abdominal pain  Acute midline low back pain without sciatica  ESRD (end stage renal disease) (HCC)    New Prescriptions New Prescriptions   No medications on file  I personally performed the services described in this documentation, which was scribed in my presence. The recorded information has been reviewed and  is accurate.        Dorinda Hillonald  Bebe Shaggy, MD 04/26/16 [phone]

## 2016-08-18 ENCOUNTER — Emergency Department (HOSPITAL_COMMUNITY): Payer: Medicare Other

## 2016-08-18 ENCOUNTER — Observation Stay (HOSPITAL_COMMUNITY)
Admission: EM | Admit: 2016-08-18 | Discharge: 2016-08-20 | Disposition: A | Discharge status: Home / Self Care | Payer: Medicare Other | Attending: Internal Medicine | Admitting: Internal Medicine

## 2016-08-18 DIAGNOSIS — D696 Thrombocytopenia, unspecified: Secondary | ICD-10-CM | POA: Diagnosis not present

## 2016-08-18 DIAGNOSIS — E039 Hypothyroidism, unspecified: Secondary | ICD-10-CM | POA: Diagnosis not present

## 2016-08-18 DIAGNOSIS — N2581 Secondary hyperparathyroidism of renal origin: Secondary | ICD-10-CM | POA: Diagnosis not present

## 2016-08-18 DIAGNOSIS — I120 Hypertensive chronic kidney disease with stage 5 chronic kidney disease or end stage renal disease: Secondary | ICD-10-CM | POA: Diagnosis not present

## 2016-08-18 DIAGNOSIS — Z96612 Presence of left artificial shoulder joint: Secondary | ICD-10-CM | POA: Diagnosis not present

## 2016-08-18 DIAGNOSIS — S42352A Displaced comminuted fracture of shaft of humerus, left arm, initial encounter for closed fracture: Secondary | ICD-10-CM

## 2016-08-18 DIAGNOSIS — D631 Anemia in chronic kidney disease: Secondary | ICD-10-CM | POA: Diagnosis present

## 2016-08-18 DIAGNOSIS — Z66 Do not resuscitate: Secondary | ICD-10-CM | POA: Diagnosis not present

## 2016-08-18 DIAGNOSIS — E1122 Type 2 diabetes mellitus with diabetic chronic kidney disease: Secondary | ICD-10-CM | POA: Diagnosis not present

## 2016-08-18 DIAGNOSIS — E871 Hypo-osmolality and hyponatremia: Secondary | ICD-10-CM | POA: Diagnosis not present

## 2016-08-18 DIAGNOSIS — M25512 Pain in left shoulder: Secondary | ICD-10-CM | POA: Insufficient documentation

## 2016-08-18 DIAGNOSIS — I10 Essential (primary) hypertension: Secondary | ICD-10-CM | POA: Diagnosis present

## 2016-08-18 DIAGNOSIS — S42412A Displaced simple supracondylar fracture without intercondylar fracture of left humerus, initial encounter for closed fracture: Principal | ICD-10-CM | POA: Diagnosis present

## 2016-08-18 DIAGNOSIS — M25519 Pain in unspecified shoulder: Secondary | ICD-10-CM | POA: Diagnosis present

## 2016-08-18 DIAGNOSIS — Z79899 Other long term (current) drug therapy: Secondary | ICD-10-CM | POA: Insufficient documentation

## 2016-08-18 DIAGNOSIS — K7460 Unspecified cirrhosis of liver: Secondary | ICD-10-CM | POA: Diagnosis present

## 2016-08-18 DIAGNOSIS — Z992 Dependence on renal dialysis: Secondary | ICD-10-CM | POA: Insufficient documentation

## 2016-08-18 DIAGNOSIS — M898X9 Other specified disorders of bone, unspecified site: Secondary | ICD-10-CM | POA: Diagnosis not present

## 2016-08-18 DIAGNOSIS — I6789 Other cerebrovascular disease: Secondary | ICD-10-CM | POA: Insufficient documentation

## 2016-08-18 DIAGNOSIS — M9732XA Periprosthetic fracture around internal prosthetic left shoulder joint, initial encounter: Secondary | ICD-10-CM | POA: Diagnosis not present

## 2016-08-18 DIAGNOSIS — N186 End stage renal disease: Secondary | ICD-10-CM

## 2016-08-18 DIAGNOSIS — F17220 Nicotine dependence, chewing tobacco, uncomplicated: Secondary | ICD-10-CM | POA: Insufficient documentation

## 2016-08-18 DIAGNOSIS — B1910 Unspecified viral hepatitis B without hepatic coma: Secondary | ICD-10-CM | POA: Diagnosis not present

## 2016-08-18 DIAGNOSIS — N189 Chronic kidney disease, unspecified: Secondary | ICD-10-CM

## 2016-08-18 DIAGNOSIS — W19XXXA Unspecified fall, initial encounter: Secondary | ICD-10-CM | POA: Diagnosis not present

## 2016-08-18 DIAGNOSIS — H9190 Unspecified hearing loss, unspecified ear: Secondary | ICD-10-CM | POA: Insufficient documentation

## 2016-08-18 DIAGNOSIS — Z9181 History of falling: Secondary | ICD-10-CM | POA: Diagnosis not present

## 2016-08-18 HISTORY — DX: Acute pancreatitis without necrosis or infection, unspecified: K85.90

## 2016-08-18 HISTORY — DX: Bacteremia: R78.81

## 2016-08-18 HISTORY — DX: Cellulitis of left upper limb: L03.114

## 2016-08-18 HISTORY — DX: Gastrointestinal hemorrhage, unspecified: K92.2

## 2016-08-18 HISTORY — DX: Other mechanical complication of other cardiac and vascular devices and implants, initial encounter: T82.598A

## 2016-08-18 HISTORY — DX: Sepsis, unspecified organism: A41.9

## 2016-08-18 HISTORY — DX: Secondary hyperparathyroidism of renal origin: N25.81

## 2016-08-18 HISTORY — DX: Abnormal uterine and vaginal bleeding, unspecified: N93.9

## 2016-08-18 LAB — CBC WITH DIFFERENTIAL/PLATELET
Basophils Absolute: 0 10*3/uL (ref 0.0–0.1)
Basophils Relative: 0 %
Eosinophils Absolute: 0.1 10*3/uL (ref 0.0–0.7)
Eosinophils Relative: 1 %
HCT: 29.9 % — ABNORMAL LOW (ref 36.0–46.0)
Hemoglobin: 9.9 g/dL — ABNORMAL LOW (ref 12.0–15.0)
Lymphocytes Relative: 13 %
Lymphs Abs: 0.8 10*3/uL (ref 0.7–4.0)
MCH: 30.8 pg (ref 26.0–34.0)
MCHC: 33.1 g/dL (ref 30.0–36.0)
MCV: 93.1 fL (ref 78.0–100.0)
Monocytes Absolute: 0.3 10*3/uL (ref 0.1–1.0)
Monocytes Relative: 5 %
Neutro Abs: 4.9 10*3/uL (ref 1.7–7.7)
Neutrophils Relative %: 80 %
Platelets: 104 10*3/uL — ABNORMAL LOW (ref 150–400)
RBC: 3.21 MIL/uL — ABNORMAL LOW (ref 3.87–5.11)
RDW: 16.2 % — ABNORMAL HIGH (ref 11.5–15.5)
WBC: 6.1 10*3/uL (ref 4.0–10.5)

## 2016-08-18 LAB — BASIC METABOLIC PANEL
Anion gap: 14 (ref 5–15)
BUN: 23 mg/dL — ABNORMAL HIGH (ref 6–20)
CO2: 23 mmol/L (ref 22–32)
Calcium: 9.1 mg/dL (ref 8.9–10.3)
Chloride: 91 mmol/L — ABNORMAL LOW (ref 101–111)
Creatinine, Ser: 7.18 mg/dL — ABNORMAL HIGH (ref 0.44–1.00)
GFR calc Af Amer: 6 mL/min — ABNORMAL LOW (ref >60)
GFR calc non Af Amer: 5 mL/min — ABNORMAL LOW (ref >60)
Glucose, Bld: 80 mg/dL (ref 65–99)
Potassium: 4.1 mmol/L (ref 3.5–5.1)
Sodium: 128 mmol/L — ABNORMAL LOW (ref 135–145)

## 2016-08-18 LAB — CREATININE, SERUM
Creatinine, Ser: 3.84 mg/dL — ABNORMAL HIGH (ref 0.44–1.00)
GFR calc Af Amer: 13 mL/min — ABNORMAL LOW (ref >60)
GFR, EST NON AFRICAN AMERICAN: 11 mL/min — AB (ref >60)

## 2016-08-18 MED ORDER — FENTANYL CITRATE (PF) 100 MCG/2ML IJ SOLN: 50.0000 ug | Freq: Once | INTRAMUSCULAR | Status: DC

## 2016-08-18 MED ORDER — OXYCODONE HCL 5 MG PO TABS
5.0000 mg | ORAL_TABLET | ORAL | Status: DC | PRN
  Administered 2016-08-18 – 2016-08-19 (×3): 5 mg via ORAL
  Filled 2016-08-18 (×2): qty 1

## 2016-08-18 MED ORDER — DOXERCALCIFEROL 4 MCG/2ML IV SOLN: 4.0000 ug | INTRAVENOUS | Status: DC

## 2016-08-18 MED ORDER — ACETAMINOPHEN 500 MG PO TABS: 500.0000 mg | ORAL_TABLET | Freq: Four times a day (QID) | ORAL | Status: DC | PRN

## 2016-08-18 MED ORDER — LACTULOSE 10 GM/15ML PO SOLN
30.0000 g | Freq: Two times a day (BID) | ORAL | Status: DC
  Administered 2016-08-18 – 2016-08-20 (×3): 30 g via ORAL
  Filled 2016-08-18 (×4): qty 45

## 2016-08-18 MED ORDER — PANTOPRAZOLE SODIUM 40 MG PO TBEC
40.0000 mg | DELAYED_RELEASE_TABLET | Freq: Two times a day (BID) | ORAL | Status: DC
  Administered 2016-08-19 – 2016-08-20 (×3): 40 mg via ORAL
  Filled 2016-08-18 (×5): qty 1

## 2016-08-18 MED ORDER — HYDROCODONE-ACETAMINOPHEN 5-325 MG PO TABS: 1.0000 | ORAL_TABLET | Freq: Four times a day (QID) | ORAL | Status: DC | PRN

## 2016-08-18 MED ORDER — ZOLPIDEM TARTRATE 5 MG PO TABS: 5.0000 mg | ORAL_TABLET | Freq: Every evening | ORAL | Status: DC | PRN

## 2016-08-18 MED ORDER — ACETAMINOPHEN 325 MG PO TABS
650.0000 mg | ORAL_TABLET | Freq: Four times a day (QID) | ORAL | Status: DC | PRN
  Administered 2016-08-19: 650 mg via ORAL
  Filled 2016-08-18: qty 2

## 2016-08-18 MED ORDER — FLEET ENEMA 7-19 GM/118ML RE ENEM: 1.0000 | ENEMA | Freq: Once | RECTAL | Status: DC | PRN

## 2016-08-18 MED ORDER — ALTEPLASE 2 MG IJ SOLR: 2.0000 mg | Freq: Once | INTRAMUSCULAR | Status: DC | PRN

## 2016-08-18 MED ORDER — KETOROLAC TROMETHAMINE 15 MG/ML IJ SOLN
15.0000 mg | Freq: Four times a day (QID) | INTRAMUSCULAR | Status: DC | PRN
  Administered 2016-08-19: 15 mg via INTRAVENOUS
  Filled 2016-08-18: qty 1

## 2016-08-18 MED ORDER — ACETAMINOPHEN 650 MG RE SUPP: 650.0000 mg | Freq: Four times a day (QID) | RECTAL | Status: DC | PRN

## 2016-08-18 MED ORDER — HEPARIN SODIUM (PORCINE) 1000 UNIT/ML DIALYSIS: 1000.0000 [IU] | INTRAMUSCULAR | Status: DC | PRN

## 2016-08-18 MED ORDER — AMLODIPINE BESYLATE 10 MG PO TABS
10.0000 mg | ORAL_TABLET | Freq: Every day | ORAL | Status: DC
  Filled 2016-08-18 (×2): qty 1

## 2016-08-18 MED ORDER — SENNOSIDES-DOCUSATE SODIUM 8.6-50 MG PO TABS
1.0000 | ORAL_TABLET | Freq: Every evening | ORAL | Status: DC | PRN
  Filled 2016-08-18: qty 1

## 2016-08-18 MED ORDER — HEPARIN SODIUM (PORCINE) 5000 UNIT/ML IJ SOLN
5000.0000 [IU] | Freq: Three times a day (TID) | INTRAMUSCULAR | Status: DC
  Administered 2016-08-18 – 2016-08-20 (×6): 5000 [IU] via SUBCUTANEOUS
  Filled 2016-08-18 (×6): qty 1

## 2016-08-18 MED ORDER — SODIUM CHLORIDE 0.9 % IV SOLN: 100.0000 mL | INTRAVENOUS | Status: DC | PRN

## 2016-08-18 MED ORDER — FENTANYL CITRATE (PF) 100 MCG/2ML IJ SOLN
50.0000 ug | Freq: Once | INTRAMUSCULAR | Status: AC
  Administered 2016-08-18: 50 ug via INTRAVENOUS

## 2016-08-18 MED ORDER — OXYCODONE HCL 5 MG PO TABS
ORAL_TABLET | ORAL | Status: AC
  Filled 2016-08-18: qty 1

## 2016-08-18 MED ORDER — FENTANYL CITRATE (PF) 100 MCG/2ML IJ SOLN
50.0000 ug | Freq: Once | INTRAMUSCULAR | Status: DC
  Filled 2016-08-18: qty 2

## 2016-08-18 MED ORDER — LEVOTHYROXINE SODIUM 50 MCG PO TABS
50.0000 ug | ORAL_TABLET | Freq: Every day | ORAL | Status: DC
  Administered 2016-08-19 – 2016-08-20 (×2): 50 ug via ORAL
  Filled 2016-08-18 (×3): qty 1

## 2016-08-18 NOTE — Progress Notes (Signed)
Orthopedic Tech Progress Note Patient Details:  Pervis Hockingerlie M Pollan 03-01-1946 [id number]  Ortho Devices Type of Ortho Device: Ace wrap, Coapt, Sling immobilizer Ortho Device/Splint Location: lue Ortho Device/Splint Interventions: Application   Ashan Cueva 08/18/2016, 2:15 PM

## 2016-08-18 NOTE — ED Notes (Signed)
Pt transported to HD at this time. Report called to [place], 5N RN and pt will be transported to the floor after HD.

## 2016-08-18 NOTE — ED Notes (Signed)
Ortho tech paged and returned phone call

## 2016-08-18 NOTE — Consult Note (Signed)
Reason for Consult:Left humerus fx Referring Physician: A Dierdre Harness Samantha [name] is an 71 y.o. female.  HPI: Samantha [name], with a hx/o multiple medical problems including DM, ESRD on HD (MWF), and profound hearing loss, fell at home yesterday onto her left side. Her walking stick slid out from under her causing her to fall. She did not hurt anything else or lose consciousness. She c/o left arm pain.  Past Medical History:  Diagnosis Date  . Anemia   . Arthritis    left shoulder  . Constipation   . Diabetes mellitus    borderline  . Dizziness   . Dry skin   . Gastric ulcer   . H/O: GI bleed   . Headache(784.0)    occasionally  . Hepatitis    Hep B  . History of blood transfusion   . History of kidney stones   . Hypertension   . Hypothyroidism    takes Synthroid daily  . Impaired hearing   . Joint pain   . Joint swelling   . Occasional numbness/prickling/tingling of fingers and toes   . Oligouria   . Peripheral edema   . Renal disorder    m, w, F   . Shoulder pain     Past Surgical History:  Procedure Laterality Date  . ARTERIOVENOUS GRAFT PLACEMENT Left    forearm  . cataract surgery     bilateral  . CHOLECYSTECTOMY  12/26/2011  . CHOLECYSTECTOMY  12/26/2011   Procedure: LAPAROSCOPIC CHOLECYSTECTOMY;  Surgeon: Gayland Curry, MD,FACS;  Location: [place];  Service: General;  Laterality: N/A;  . ESOPHAGOGASTRODUODENOSCOPY  06/09/2011   Procedure: ESOPHAGOGASTRODUODENOSCOPY (EGD);  Surgeon: Beryle Beams, MD;  Location: [facility] ENDOSCOPY;  Service: Endoscopy;  Laterality: N/A;  . I&D EXTREMITY Left 03/23/2015   Procedure: DRAINAGE OF LEFT ARM SEROMA;  Surgeon: Conrad Nooksack, MD;  Location: [place];  Service: Vascular;  Laterality: Left;  . INSERTION OF DIALYSIS CATHETER Right 01/12/2015   Procedure: INSERTION OF DIALYSIS CATHETER;  Surgeon: Conrad [place], MD;  Location: [place];  Service: Vascular;  Laterality: Right;  . LIGATION ARTERIOVENOUS GORTEX GRAFT Left 03/23/2015   Procedure:  LIGATION AND EXCISION OF LEFT ARTERIOVENOUS GORTEX GRAFT;  Surgeon: Conrad Palisade, MD;  Location: [place];  Service: Vascular;  Laterality: Left;  . REVERSE SHOULDER ARTHROPLASTY  09/22/2011   Procedure: REVERSE SHOULDER ARTHROPLASTY;  Surgeon: Augustin Schooling, MD;  Location: [place];  Service: Orthopedics;  Laterality: Left;  left reverse shoulder arthroplasty  . REVISION OF ARTERIOVENOUS GORETEX GRAFT Left 01/12/2015   Procedure: REVISION OF Left FOREARM ARTERIOVENOUS GORETEX GRAFT;  Surgeon: Conrad Johnson, MD;  Location: [place];  Service: Vascular;  Laterality: Left;  . SHOULDER SURGERY  3-80yr ago   right replacement  . SHUNTOGRAM Left 08/29/2012   Procedure: SHUNTOGRAM;  Surgeon: BConrad  MD;  [facility];  Service: Cardiovascular;  Laterality: Left;  arm    Family History  Problem Relation Age of Onset  . Hypertension Mother   . Diabetes Mother   . Cancer Brother     Social History:  reports that she has never smoked. Her smokeless tobacco use includes Chew. She reports that she does not drink alcohol or use drugs.  Allergies:  Allergies  Allergen Reactions  . Banana Other (See Comments)    Due to dialysis  . Chocolate Other (See Comments)    Due to dialysis  . Fish-Derived Products Other (See Comments)  Due to gout    Medications: I have reviewed the patient's current medications.  Results for orders placed or performed during the hospital encounter of 08/18/16 (from the past 48 hour(s))  Basic metabolic panel     Status: Abnormal   Collection Time: 08/18/16 12:33 PM  Result Value Ref Range   Sodium 128 (L) 135 - 145 mmol/L   Potassium 4.1 3.5 - 5.1 mmol/L   Chloride 91 (L) 101 - 111 mmol/L   CO2 23 22 - 32 mmol/L   Glucose, Bld 80 65 - 99 mg/dL   BUN 23 (H) 6 - 20 mg/dL   Creatinine, Ser 7.18 (H) 0.44 - 1.00 mg/dL   Calcium 9.1 8.9 - 10.3 mg/dL   GFR calc non Af Amer 5 (L) >60 mL/min   GFR calc Af Amer 6 (L) >60 mL/min    Comment: (NOTE) The eGFR has been  calculated using the CKD EPI equation. This calculation has not been validated in all clinical situations. eGFR's persistently <60 mL/min signify possible Chronic Kidney Disease.    Anion gap 14 5 - 15  CBC with Differential     Status: Abnormal (Preliminary result)   Collection Time: 08/18/16 12:33 PM  Result Value Ref Range   WBC 6.1 4.0 - 10.5 K/uL   RBC 3.21 (L) 3.87 - 5.11 MIL/uL   Hemoglobin 9.9 (L) 12.0 - 15.0 g/dL   HCT 29.9 (L) 36.0 - 46.0 %   MCV 93.1 78.0 - 100.0 fL   MCH 30.8 26.0 - 34.0 pg   MCHC 33.1 30.0 - 36.0 g/dL   RDW 16.2 (H) 11.5 - 15.5 %   Platelets PENDING 150 - 400 K/uL   Neutrophils Relative % 80 %   Neutro Abs 4.9 1.7 - 7.7 K/uL   Lymphocytes Relative 13 %   Lymphs Abs 0.8 0.7 - 4.0 K/uL   Monocytes Relative 5 %   Monocytes Absolute 0.3 0.1 - 1.0 K/uL   Eosinophils Relative 1 %   Eosinophils Absolute 0.1 0.0 - 0.7 K/uL   Basophils Relative 0 %   Basophils Absolute 0.0 0.0 - 0.1 K/uL    Dg Forearm Left  Result Date: 08/18/2016 CLINICAL DATA:  71 year old with prior left shoulder arthroplasty who fell earlier today. Obvious deformity of the upper arm. Initial encounter. EXAM: LEFT FOREARM - 2 VIEW COMPARISON:  No prior left forearm imaging. FINDINGS: Soft tissue swelling/ecchymosis involving the dorsal surface of the proximal forearm. No acute fractures involving the radius or ulna. Visualized elbow joint intact. Degenerative changes at the visualized wrist joint. Osseous demineralization. Left forearm dialysis graft. IMPRESSION: 1. No acute osseous abnormality. 2. Osseous demineralization. 3. Soft tissue swelling/ecchymosis involving the dorsal surface of the proximal forearm. Electronically Signed   By: Evangeline Dakin M.D.   On: 08/18/2016 11:31   Ct Head Wo Contrast  Result Date: 08/18/2016 CLINICAL DATA:  Pain following fall EXAM: CT HEAD WITHOUT CONTRAST CT CERVICAL SPINE WITHOUT CONTRAST TECHNIQUE: Multidetector CT imaging of the head and cervical  spine was performed following the standard protocol without intravenous contrast. Multiplanar CT image reconstructions of the cervical spine were also generated. COMPARISON:  Head CT August 14, 2015 and brain MRI Aug 17, 2015 FINDINGS: CT HEAD FINDINGS Brain: Diffuse atrophy is stable compared to prior studies. There is no intracranial mass, hemorrhage, extra-axial fluid collection, or midline shift. There is patchy small vessel disease in the centra semiovale bilaterally. No acute infarct is demonstrable. Vascular: No hyperdense vessel. There is calcification in  each carotid siphon region. Skull: Bony calvarium appears intact. Sinuses/Orbits: There is mucosal thickening in multiple ethmoid air cells bilaterally. Visualized paranasal sinuses elsewhere clear. Orbits appear symmetric bilaterally. Other: Mastoid air cells are clear. CT CERVICAL SPINE FINDINGS Alignment: There is cervical levoscoliosis. No spondylolisthesis is evident. Skull base and vertebrae: The skull base and craniocervical junction regions appear unremarkable. There is pannus posterior to the odontoid which is not causing significant impression on the craniocervical junction. Bones are osteoporotic. There is no evident fracture. There are no blastic or lytic bone lesions. Soft tissues and spinal canal: Prevertebral soft tissues and predental space regions are normal. No paraspinous lesions are evident. There is no cord or canal hematoma appreciable. There is a small calcified meningioma posteriorly at C6-7 measuring 5 x 6 mm without surrounding edema or mass effect. Disc levels: There is disc space narrowing at all levels, most marked at C2-3, C4-5, and C5-6. There are multiple cystic areas in the endplates at all levels. There is also cystic change in the odontoid. There is facet arthropathy at essentially all levels bilaterally. There is no disc extrusion or stenosis. Upper chest: Visualized upper lung zones appear unremarkable. There is  calcification in the right subclavian artery. Other: There is arthropathy in both temporomandibular joints. There is calcification in both carotid arteries. IMPRESSION: CT head: Diffuse atrophy with patchy periventricular small vessel disease, stable. No intracranial mass hemorrhage, or extra-axial fluid collection. No evident acute infarct. There are foci of arteriovascular calcification. There is mucosal thickening in multiple ethmoid air cells. CT cervical spine: No fracture or spondylolisthesis. There is cervical levoscoliosis. There is extensive multilevel arthropathy. There is arthropathy in both temporomandibular joints. There is carotid artery calcification bilaterally as well as right subclavian artery calcification. There is a small calcified meningioma posteriorly at C6-7 without edema or appreciable mass effect. Electronically Signed   By: Lowella Grip III M.D.   On: 08/18/2016 11:53   Ct Cervical Spine Wo Contrast  Result Date: 08/18/2016 CLINICAL DATA:  Pain following fall EXAM: CT HEAD WITHOUT CONTRAST CT CERVICAL SPINE WITHOUT CONTRAST TECHNIQUE: Multidetector CT imaging of the head and cervical spine was performed following the standard protocol without intravenous contrast. Multiplanar CT image reconstructions of the cervical spine were also generated. COMPARISON:  Head CT August 14, 2015 and brain MRI Aug 17, 2015 FINDINGS: CT HEAD FINDINGS Brain: Diffuse atrophy is stable compared to prior studies. There is no intracranial mass, hemorrhage, extra-axial fluid collection, or midline shift. There is patchy small vessel disease in the centra semiovale bilaterally. No acute infarct is demonstrable. Vascular: No hyperdense vessel. There is calcification in each carotid siphon region. Skull: Bony calvarium appears intact. Sinuses/Orbits: There is mucosal thickening in multiple ethmoid air cells bilaterally. Visualized paranasal sinuses elsewhere clear. Orbits appear symmetric bilaterally. Other:  Mastoid air cells are clear. CT CERVICAL SPINE FINDINGS Alignment: There is cervical levoscoliosis. No spondylolisthesis is evident. Skull base and vertebrae: The skull base and craniocervical junction regions appear unremarkable. There is pannus posterior to the odontoid which is not causing significant impression on the craniocervical junction. Bones are osteoporotic. There is no evident fracture. There are no blastic or lytic bone lesions. Soft tissues and spinal canal: Prevertebral soft tissues and predental space regions are normal. No paraspinous lesions are evident. There is no cord or canal hematoma appreciable. There is a small calcified meningioma posteriorly at C6-7 measuring 5 x 6 mm without surrounding edema or mass effect. Disc levels: There is disc space narrowing at all  levels, most marked at C2-3, C4-5, and C5-6. There are multiple cystic areas in the endplates at all levels. There is also cystic change in the odontoid. There is facet arthropathy at essentially all levels bilaterally. There is no disc extrusion or stenosis. Upper chest: Visualized upper lung zones appear unremarkable. There is calcification in the right subclavian artery. Other: There is arthropathy in both temporomandibular joints. There is calcification in both carotid arteries. IMPRESSION: CT head: Diffuse atrophy with patchy periventricular small vessel disease, stable. No intracranial mass hemorrhage, or extra-axial fluid collection. No evident acute infarct. There are foci of arteriovascular calcification. There is mucosal thickening in multiple ethmoid air cells. CT cervical spine: No fracture or spondylolisthesis. There is cervical levoscoliosis. There is extensive multilevel arthropathy. There is arthropathy in both temporomandibular joints. There is carotid artery calcification bilaterally as well as right subclavian artery calcification. There is a small calcified meningioma posteriorly at C6-7 without edema or  appreciable mass effect. Electronically Signed   By: Lowella Grip III M.D.   On: 08/18/2016 11:53   Dg Shoulder Left  Result Date: 08/18/2016 CLINICAL DATA:  71 year old with prior left shoulder arthroplasty who fell today and injured the left shoulder. Obvious deformity. Initial encounter. EXAM: LEFT SHOULDER - 2+ VIEW COMPARISON:  Bone window images from CT of the left upper extremity 12/27/2014. Left shoulder x-rays 09/22/2011, 03/15/2012. CT left shoulder 05/09/2011. FINDINGS: Prior left shoulder arthroplasty with anatomic alignment of the prosthesis. Acute traumatic comminuted fracture involving the proximal humeral metaphysis at the level of the distal portion of the humeral component of the prosthesis. Acromioclavicular joint intact with degenerative changes. IMPRESSION: 1. Acute comminuted fracture involving the proximal humeral metaphysis. The fracture is at the level of the distal portion of the humeral component of the left shoulder prosthesis. 2. Left shoulder prosthesis with anatomic alignment. Electronically Signed   By: Evangeline Dakin M.D.   On: 08/18/2016 11:29   Dg Humerus Left  Result Date: 08/18/2016 CLINICAL DATA:  71 year old female with a history of fall and pain. EXAM: LEFT HUMERUS - 2+ VIEW COMPARISON:  12/27/2014, 09/22/2011 FINDINGS: Acute fracture of the left humerus at the distal aspect of the surgical hardware, mid humerus. Surgical changes of total left shoulder reverse arthroplasty. Osteopenia. Swelling at fracture site IMPRESSION: Acute fracture of the humerus in the midshaft at the distal aspect of the surgical hardware. Associated soft tissue swelling. Electronically Signed   By: Corrie Mckusick D.O.   On: 08/18/2016 11:49    Review of Systems  Unable to perform ROS: Other  Musculoskeletal: Positive for joint pain (Left arm).   Blood pressure 136/74, pulse 94, temperature 97.7 F (36.5 C), temperature source Oral, resp. rate 18, SpO2 98 %. Physical Exam   Constitutional: She appears well-developed and well-nourished. No distress.  HENT:  Head: Normocephalic.  Eyes: Conjunctivae are normal. Right eye exhibits no discharge. Left eye exhibits no discharge. No scleral icterus.  Cardiovascular: Normal rate and regular rhythm.   Respiratory: Effort normal. No respiratory distress.  Musculoskeletal:  Right shoulder, elbow, wrist, digits- no skin wounds, nontender, no instability, no blocks to motion  Sens  Ax/R/M/U intact  Mot   Ax/ R/ PIN/ M/ AIN/ U intact  Rad 2+  Left shoulder, elbow, wrist, digits- no skin wounds, upper arm swollen and TTP, resistant to movement  Sens  Ax/R/M/U intact  Mot   Ax/ R/ PIN/ M/ AIN/ U intact  Rad 2+  BLE No traumatic wounds, ecchymosis, or rash  Nontender  No  effusions  Knee stable to varus/ valgus and anterior/posterior stress  Sens DPN, SPN, TN intact  Motor EHL, ext, flex, evers 5/5  DP 1+, PT 1+, mild edema   **Exam limited by profound hearing loss  Lymphadenopathy:    She has no cervical adenopathy.  Neurological: She is alert.  Skin: Skin is warm and dry. She is not diaphoretic.  Psychiatric: She has a normal mood and affect. Her behavior is normal.    Assessment/Plan: Fall Left periprosthetic humeral shaft fx -- Coap splint and sling for now. Will need ORIF in near future. Dr. Stann Mainland to assess.  Multiple medical problems -- IM to admit    Lisette Abu, PA-C Orthopedic Surgery [phone] 08/18/2016, 1:29 PM

## 2016-08-18 NOTE — ED Notes (Signed)
IV attempt with blooddraw unsuccessful x2

## 2016-08-18 NOTE — ED Notes (Signed)
Pt belongings (clothing, ring, shoes, charger) put into bag and sent with pt up to HD.

## 2016-08-18 NOTE — Consult Note (Signed)
[facility] Renal Consultation Note    Indication for Consultation:  Management of ESRD/hemodialysis, anemia, hypertension/volume, and secondary hyperparathyroidism. PCP:  HPI: Samantha [name] is a 71 y.o. female with ESRD, cirrhosis, Type 2 DM, hypothyroidism, and significant hearing loss who is being admitted under observation status for L humerus fracture.   Her hearing difficulties make getting a full history difficult. Per notes, she fell after her cane slipped on 5/3 and developed L shoulder/arm pain. She had no LOC or head injury. In ED, found to have displaced L humerus fracture below surgical hardware from prior L shoulder replacement. Orthopedic surgery has seen her and splinted her arm with plan for potential ORIF.  From renal standpoint, she dialyzes MWF at [facility]. Today is her dialysis day. Denies CP or dyspnea, c/o L arm pain only. She uses a tunneled PC for her dialysis access.  Past Medical History:  Diagnosis Date  . Anemia   . Arthritis    left shoulder  . Constipation   . Diabetes mellitus    borderline  . Dizziness   . Dry skin   . Gastric ulcer   . H/O: GI bleed   . Headache(784.0)    occasionally  . Hepatitis    Hep B  . History of blood transfusion   . History of kidney stones   . Hypertension   . Hypothyroidism    takes Synthroid daily  . Impaired hearing   . Joint pain   . Joint swelling   . Occasional numbness/prickling/tingling of fingers and toes   . Oligouria   . Peripheral edema   . Renal disorder    m, w, F   . Shoulder pain    Past Surgical History:  Procedure Laterality Date  . ARTERIOVENOUS GRAFT PLACEMENT Left    forearm  . cataract surgery     bilateral  . CHOLECYSTECTOMY  12/26/2011  . CHOLECYSTECTOMY  12/26/2011   Procedure: LAPAROSCOPIC CHOLECYSTECTOMY;  Surgeon: Atilano Ina, MD,FACS;  Location: [facility] OR;  Service: General;  Laterality: N/A;  . ESOPHAGOGASTRODUODENOSCOPY  06/09/2011   Procedure:  ESOPHAGOGASTRODUODENOSCOPY (EGD);  Surgeon: Theda Belfast, MD;  Location: [facility] ENDOSCOPY;  Service: Endoscopy;  Laterality: N/A;  . I&D EXTREMITY Left 03/23/2015   Procedure: DRAINAGE OF LEFT ARM SEROMA;  Surgeon: Fransisco Hertz, MD;  Location: [facility] OR;  Service: Vascular;  Laterality: Left;  . INSERTION OF DIALYSIS CATHETER Right 01/12/2015   Procedure: INSERTION OF DIALYSIS CATHETER;  Surgeon: Fransisco Hertz, MD;  Location: [facility] OR;  Service: Vascular;  Laterality: Right;  . LIGATION ARTERIOVENOUS GORTEX GRAFT Left 03/23/2015   Procedure: LIGATION AND EXCISION OF LEFT ARTERIOVENOUS GORTEX GRAFT;  Surgeon: Fransisco Hertz, MD;  Location: [facility] OR;  Service: Vascular;  Laterality: Left;  . REVERSE SHOULDER ARTHROPLASTY  09/22/2011   Procedure: REVERSE SHOULDER ARTHROPLASTY;  Surgeon: Verlee Rossetti, MD;  Location: [facility] OR;  Service: Orthopedics;  Laterality: Left;  left reverse shoulder arthroplasty  . REVISION OF ARTERIOVENOUS GORETEX GRAFT Left 01/12/2015   Procedure: REVISION OF Left FOREARM ARTERIOVENOUS GORETEX GRAFT;  Surgeon: Fransisco Hertz, MD;  Location: [facility] OR;  Service: Vascular;  Laterality: Left;  . SHOULDER SURGERY  3-41yrs ago   right replacement  . SHUNTOGRAM Left 08/29/2012   Procedure: SHUNTOGRAM;  Surgeon: Fransisco Hertz, MD;  Location: [facility] CATH LAB;  Service: Cardiovascular;  Laterality: Left;  arm   Family History  Problem Relation Age of Onset  . Hypertension Mother   .  Diabetes Mother   . Cancer Brother    Social History:  reports that she has never smoked. Her smokeless tobacco use includes Chew. She reports that she does not drink alcohol or use drugs.  ROS: As per HPI otherwise negative (limited due to hearing impairment).  Physical Exam: Vitals:   08/18/16 1245 08/18/16 1300 08/18/16 1315 08/18/16 1330  BP: 137/75 139/77 128/71 124/64  Pulse: 94 94 94 93  Resp:    20  Temp:      TempSrc:      SpO2: 98% 99% 98% 99%     General: Well developed, well nourished, in no acute  distress. Head: Normocephalic, atraumatic, sclera non-icteric, mucus membranes are moist. Neck: Supple without lymphadenopathy/masses.  Lungs: CTA anteriorly. Unable to listen posteriorly due to large L arm splint. Heart: RRR with normal S1, S2. No murmurs, rubs, or gallops appreciated. Abdomen: Soft, non-tender. Musculoskeletal:  Strength and tone appear normal for age. Lower extremities: Trace LE edema. Neuro: Alert and oriented X 3. Hard of hearing. Dialysis Access: TDC in R chest  Allergies  Allergen Reactions  . Banana Other (See Comments)    Due to dialysis  . Chocolate Other (See Comments)    Due to dialysis  . Fish-Derived Products Other (See Comments)    Due to gout   Prior to Admission medications   Medication Sig Start Date End Date Taking? Authorizing Provider  acetaminophen (TYLENOL) 500 MG tablet Take 500 mg by mouth every 6 (six) hours as needed for moderate pain.    Historical Provider, MD  amLODipine (NORVASC) 10 MG tablet Take 1 tablet (10 mg total) by mouth daily. 11/08/15   Linwood Dibbles, MD  HYDROcodone-acetaminophen (NORCO/VICODIN) 5-325 MG tablet Take 1 tablet by mouth every 6 (six) hours as needed for moderate pain. 08/20/15   Costin Otelia Sergeant, MD  lactulose (CHRONULAC) 10 GM/15ML solution Take 45 mLs (30 g total) by mouth 2 (two) times daily. 06/18/15   Rolly Salter, MD  levothyroxine (SYNTHROID, LEVOTHROID) 50 MCG tablet Take 50 mcg by mouth daily before breakfast.  04/15/15   Historical Provider, MD  pantoprazole (PROTONIX) 40 MG tablet Take 1 tablet (40 mg total) by mouth 2 (two) times daily before a meal. 06/18/15   Rolly Salter, MD   Current Facility-Administered Medications  Medication Dose Route Frequency Provider Last Rate Last Dose  . acetaminophen (TYLENOL) tablet 650 mg  650 mg Oral Q6H PRN Lahoma Crocker, MD       Or  . acetaminophen (TYLENOL) suppository 650 mg  650 mg Rectal Q6H PRN Lahoma Crocker, MD      . amLODipine (NORVASC) tablet 10 mg  10  mg Oral Daily Lahoma Crocker, MD      . heparin injection 5,000 Units  5,000 Units Subcutaneous Q8H Lahoma Crocker, MD      . HYDROcodone-acetaminophen (NORCO/VICODIN) 5-325 MG per tablet 1 tablet  1 tablet Oral Q6H PRN Lahoma Crocker, MD      . ketorolac (TORADOL) 15 MG/ML injection 15 mg  15 mg Intravenous Q6H PRN Lahoma Crocker, MD      . lactulose (CHRONULAC) 10 GM/15ML solution 30 g  30 g Oral BID Lahoma Crocker, MD      . Melene Muller ON 08/19/2016] levothyroxine (SYNTHROID, LEVOTHROID) tablet 50 mcg  50 mcg Oral QAC breakfast Lahoma Crocker, MD      . oxyCODONE (Oxy IR/ROXICODONE) immediate release tablet 5 mg  5 mg Oral Q4H  PRN Lahoma Crocker, MD      . pantoprazole (PROTONIX) EC tablet 40 mg  40 mg Oral BID AC Lahoma Crocker, MD      . senna-docusate (Senokot-S) tablet 1 tablet  1 tablet Oral QHS PRN Lahoma Crocker, MD      . sodium phosphate (FLEET) 7-19 GM/118ML enema 1 enema  1 enema Rectal Once PRN Lahoma Crocker, MD      . zolpidem (AMBIEN) tablet 5 mg  5 mg Oral QHS PRN Lahoma Crocker, MD       Current Outpatient Prescriptions  Medication Sig Dispense Refill  . acetaminophen (TYLENOL) 500 MG tablet Take 500 mg by mouth every 6 (six) hours as needed for moderate pain.    Marland Kitchen amLODipine (NORVASC) 10 MG tablet Take 1 tablet (10 mg total) by mouth daily. 30 tablet 1  . HYDROcodone-acetaminophen (NORCO/VICODIN) 5-325 MG tablet Take 1 tablet by mouth every 6 (six) hours as needed for moderate pain. 30 tablet 0  . lactulose (CHRONULAC) 10 GM/15ML solution Take 45 mLs (30 g total) by mouth 2 (two) times daily. 240 mL 0  . levothyroxine (SYNTHROID, LEVOTHROID) 50 MCG tablet Take 50 mcg by mouth daily before breakfast.     . pantoprazole (PROTONIX) 40 MG tablet Take 1 tablet (40 mg total) by mouth 2 (two) times daily before a meal. 30 tablet 0   Labs: Basic Metabolic Panel:  Recent Labs Lab 08/18/16 1233  NA 128*  K 4.1  CL 91*  CO2 23  GLUCOSE 80  BUN 23*   CREATININE 7.18*  CALCIUM 9.1   CBC:  Recent Labs Lab 08/18/16 1233  WBC 6.1  NEUTROABS 4.9  HGB 9.9*  HCT 29.9*  MCV 93.1  PLT 104*   Studies/Results: Dg Forearm Left  Result Date: 08/18/2016 CLINICAL DATA:  71 year old with prior left shoulder arthroplasty who fell earlier today. Obvious deformity of the upper arm. Initial encounter. EXAM: LEFT FOREARM - 2 VIEW COMPARISON:  No prior left forearm imaging. FINDINGS: Soft tissue swelling/ecchymosis involving the dorsal surface of the proximal forearm. No acute fractures involving the radius or ulna. Visualized elbow joint intact. Degenerative changes at the visualized wrist joint. Osseous demineralization. Left forearm dialysis graft. IMPRESSION: 1. No acute osseous abnormality. 2. Osseous demineralization. 3. Soft tissue swelling/ecchymosis involving the dorsal surface of the proximal forearm. Electronically Signed   By: Hulan Saas M.D.   On: 08/18/2016 11:31   Ct Head Wo Contrast  Result Date: 08/18/2016 CLINICAL DATA:  Pain following fall EXAM: CT HEAD WITHOUT CONTRAST CT CERVICAL SPINE WITHOUT CONTRAST TECHNIQUE: Multidetector CT imaging of the head and cervical spine was performed following the standard protocol without intravenous contrast. Multiplanar CT image reconstructions of the cervical spine were also generated. COMPARISON:  Head CT August 14, 2015 and brain MRI Aug 17, 2015 FINDINGS: CT HEAD FINDINGS Brain: Diffuse atrophy is stable compared to prior studies. There is no intracranial mass, hemorrhage, extra-axial fluid collection, or midline shift. There is patchy small vessel disease in the centra semiovale bilaterally. No acute infarct is demonstrable. Vascular: No hyperdense vessel. There is calcification in each carotid siphon region. Skull: Bony calvarium appears intact. Sinuses/Orbits: There is mucosal thickening in multiple ethmoid air cells bilaterally. Visualized paranasal sinuses elsewhere clear. Orbits appear  symmetric bilaterally. Other: Mastoid air cells are clear. CT CERVICAL SPINE FINDINGS Alignment: There is cervical levoscoliosis. No spondylolisthesis is evident. Skull base and vertebrae: The skull base and craniocervical junction regions appear unremarkable. There  is pannus posterior to the odontoid which is not causing significant impression on the craniocervical junction. Bones are osteoporotic. There is no evident fracture. There are no blastic or lytic bone lesions. Soft tissues and spinal canal: Prevertebral soft tissues and predental space regions are normal. No paraspinous lesions are evident. There is no cord or canal hematoma appreciable. There is a small calcified meningioma posteriorly at C6-7 measuring 5 x 6 mm without surrounding edema or mass effect. Disc levels: There is disc space narrowing at all levels, most marked at C2-3, C4-5, and C5-6. There are multiple cystic areas in the endplates at all levels. There is also cystic change in the odontoid. There is facet arthropathy at essentially all levels bilaterally. There is no disc extrusion or stenosis. Upper chest: Visualized upper lung zones appear unremarkable. There is calcification in the right subclavian artery. Other: There is arthropathy in both temporomandibular joints. There is calcification in both carotid arteries. IMPRESSION: CT head: Diffuse atrophy with patchy periventricular small vessel disease, stable. No intracranial mass hemorrhage, or extra-axial fluid collection. No evident acute infarct. There are foci of arteriovascular calcification. There is mucosal thickening in multiple ethmoid air cells. CT cervical spine: No fracture or spondylolisthesis. There is cervical levoscoliosis. There is extensive multilevel arthropathy. There is arthropathy in both temporomandibular joints. There is carotid artery calcification bilaterally as well as right subclavian artery calcification. There is a small calcified meningioma posteriorly at  C6-7 without edema or appreciable mass effect. Electronically Signed   By: Bretta Bang III M.D.   On: 08/18/2016 11:53   Ct Cervical Spine Wo Contrast  Result Date: 08/18/2016 CLINICAL DATA:  Pain following fall EXAM: CT HEAD WITHOUT CONTRAST CT CERVICAL SPINE WITHOUT CONTRAST TECHNIQUE: Multidetector CT imaging of the head and cervical spine was performed following the standard protocol without intravenous contrast. Multiplanar CT image reconstructions of the cervical spine were also generated. COMPARISON:  Head CT August 14, 2015 and brain MRI Aug 17, 2015 FINDINGS: CT HEAD FINDINGS Brain: Diffuse atrophy is stable compared to prior studies. There is no intracranial mass, hemorrhage, extra-axial fluid collection, or midline shift. There is patchy small vessel disease in the centra semiovale bilaterally. No acute infarct is demonstrable. Vascular: No hyperdense vessel. There is calcification in each carotid siphon region. Skull: Bony calvarium appears intact. Sinuses/Orbits: There is mucosal thickening in multiple ethmoid air cells bilaterally. Visualized paranasal sinuses elsewhere clear. Orbits appear symmetric bilaterally. Other: Mastoid air cells are clear. CT CERVICAL SPINE FINDINGS Alignment: There is cervical levoscoliosis. No spondylolisthesis is evident. Skull base and vertebrae: The skull base and craniocervical junction regions appear unremarkable. There is pannus posterior to the odontoid which is not causing significant impression on the craniocervical junction. Bones are osteoporotic. There is no evident fracture. There are no blastic or lytic bone lesions. Soft tissues and spinal canal: Prevertebral soft tissues and predental space regions are normal. No paraspinous lesions are evident. There is no cord or canal hematoma appreciable. There is a small calcified meningioma posteriorly at C6-7 measuring 5 x 6 mm without surrounding edema or mass effect. Disc levels: There is disc space narrowing  at all levels, most marked at C2-3, C4-5, and C5-6. There are multiple cystic areas in the endplates at all levels. There is also cystic change in the odontoid. There is facet arthropathy at essentially all levels bilaterally. There is no disc extrusion or stenosis. Upper chest: Visualized upper lung zones appear unremarkable. There is calcification in the right subclavian artery. Other: There is  arthropathy in both temporomandibular joints. There is calcification in both carotid arteries. IMPRESSION: CT head: Diffuse atrophy with patchy periventricular small vessel disease, stable. No intracranial mass hemorrhage, or extra-axial fluid collection. No evident acute infarct. There are foci of arteriovascular calcification. There is mucosal thickening in multiple ethmoid air cells. CT cervical spine: No fracture or spondylolisthesis. There is cervical levoscoliosis. There is extensive multilevel arthropathy. There is arthropathy in both temporomandibular joints. There is carotid artery calcification bilaterally as well as right subclavian artery calcification. There is a small calcified meningioma posteriorly at C6-7 without edema or appreciable mass effect. Electronically Signed   By: Bretta BangWilliam  Woodruff III M.D.   On: 08/18/2016 11:53   Dg Shoulder Left  Result Date: 08/18/2016 CLINICAL DATA:  71 year old with prior left shoulder arthroplasty who fell today and injured the left shoulder. Obvious deformity. Initial encounter. EXAM: LEFT SHOULDER - 2+ VIEW COMPARISON:  Bone window images from CT of the left upper extremity 12/27/2014. Left shoulder x-rays 09/22/2011, 03/15/2012. CT left shoulder 05/09/2011. FINDINGS: Prior left shoulder arthroplasty with anatomic alignment of the prosthesis. Acute traumatic comminuted fracture involving the proximal humeral metaphysis at the level of the distal portion of the humeral component of the prosthesis. Acromioclavicular joint intact with degenerative changes. IMPRESSION: 1.  Acute comminuted fracture involving the proximal humeral metaphysis. The fracture is at the level of the distal portion of the humeral component of the left shoulder prosthesis. 2. Left shoulder prosthesis with anatomic alignment. Electronically Signed   By: Hulan Saashomas  Lawrence M.D.   On: 08/18/2016 11:29   Dg Humerus Left  Result Date: 08/18/2016 CLINICAL DATA:  71 year old female with a history of fall and pain. EXAM: LEFT HUMERUS - 2+ VIEW COMPARISON:  12/27/2014, 09/22/2011 FINDINGS: Acute fracture of the left humerus at the distal aspect of the surgical hardware, mid humerus. Surgical changes of total left shoulder reverse arthroplasty. Osteopenia. Swelling at fracture site IMPRESSION: Acute fracture of the humerus in the midshaft at the distal aspect of the surgical hardware. Associated soft tissue swelling. Electronically Signed   By: Gilmer MorJaime  Wagner D.O.   On: 08/18/2016 11:49    Dialysis Orders:  [facility] 4 hours, BFR400, DFR A1.5, 3K/2.25Ca, EDW 81.5kg, linear Na, Profile 4, TDC - No heparin (except cath lock) - Hectoral 4mcg IV q HD - Venofer 50mg  IV weekly - Mircera 100mcg IV q 2 weeks (last given 5/2)  Assessment/Plan: 1.  Displaced L humerus fracture: Per orthopedic surgery. In large splint with possible surgery planned. 2.  ESRD: Continue MWF schedule, dialyzing today (5/4). No heparin, 3K bath.  3.  Hypertension/volume: BP controlled, mild LE edema. 2L UF goal today (unable to weigh d/t injury). 4.  Anemia: Hgb 9.9. Not due for ESA yet, monitor. 5.  Metabolic bone disease:Ca ok, continue VDRA.  Ozzie HoyleKatie Stovall, PA-C 08/18/2016, 3:28 PM  [facility] Pager: [phone]  Pt seen, examined and agree w A/P as above.  Vinson Moselleob Gerold Sar MD [facility] pager [phone]   08/18/2016, 3:58 PM

## 2016-08-18 NOTE — ED Notes (Signed)
Ortho at bedside.

## 2016-08-18 NOTE — Procedures (Signed)
  I was present at this dialysis session, have reviewed the session itself and made  appropriate changes Vinson Moselleob Conception Doebler MD [facility] pager [phone]   08/18/2016, 3:59 PM

## 2016-08-18 NOTE — ED Notes (Signed)
IV team at bedside 

## 2016-08-18 NOTE — H&P (Signed)
History and Physical    Samantha [name] GNF:[id number] DOB: 24-Nov-1945 DOA: 08/18/2016  PCP: Dorrene German, MD   Patient coming from: Home  I have personally briefly reviewed patient's old medical records in [facility]  Chief Complaint: Patient presented from home with complaints of left arm pain  HPI: Samantha [name] is a 71 y.o. female with medical history significant of cirrhosis, end-stage renal disease on hemodialysis Monday Wednesday Friday, diastolic congestive heart failure compensated, anemia of chronic renal disease, and hyponatremia with profound hearing loss.. Patient presented to the emergency department with complaints of having a fall yesterday. She is on hemodialysis Monday Wednesday and Friday missed today's treatment due to being unable to get to the facility without significant pain. She was using a walking stick when she fell and slid out from under her causing her to fall. She denies having hurt anything else or losing any consciousness.  ED Course: In the ED the patient was fully evaluated and found to have a humerus fracture below her surgical hardware for her left shoulder replacement. She was seen in consultation by the physician assistant for orthopedics who felt that inpatient management would be appropriate but as the patient is a dialysis patient she was referred to me.  Review of Systems:  Patient extremely hard of hearing she denies any cough or sputum production she denies any current confusion or loss of consciousness or dizziness. She denies any chest pain or shortness of breath. She has had no palpitations or wheezing. All other systems were asked and negative. ROS Past Medical History:  Diagnosis Date  . Anemia   . Arthritis    left shoulder  . Constipation   . Diabetes mellitus    borderline  . Dizziness   . Dry skin   . Gastric ulcer   . H/O: GI bleed   . Headache(784.0)    occasionally  . Hepatitis    Hep B  . History of blood  transfusion   . History of kidney stones   . Hypertension   . Hypothyroidism    takes Synthroid daily  . Impaired hearing   . Joint pain   . Joint swelling   . Occasional numbness/prickling/tingling of fingers and toes   . Oligouria   . Peripheral edema   . Renal disorder    m, w, F   . Shoulder pain     Past Surgical History:  Procedure Laterality Date  . ARTERIOVENOUS GRAFT PLACEMENT Left    forearm  . cataract surgery     bilateral  . CHOLECYSTECTOMY  12/26/2011  . CHOLECYSTECTOMY  12/26/2011   Procedure: LAPAROSCOPIC CHOLECYSTECTOMY;  Surgeon: Atilano Ina, MD,FACS;  Location: [facility] OR;  Service: General;  Laterality: N/A;  . ESOPHAGOGASTRODUODENOSCOPY  06/09/2011   Procedure: ESOPHAGOGASTRODUODENOSCOPY (EGD);  Surgeon: Theda Belfast, MD;  Location: [facility] ENDOSCOPY;  Service: Endoscopy;  Laterality: N/A;  . I&D EXTREMITY Left 03/23/2015   Procedure: DRAINAGE OF LEFT ARM SEROMA;  Surgeon: Fransisco Hertz, MD;  Location: [facility] OR;  Service: Vascular;  Laterality: Left;  . INSERTION OF DIALYSIS CATHETER Right 01/12/2015   Procedure: INSERTION OF DIALYSIS CATHETER;  Surgeon: Fransisco Hertz, MD;  Location: [facility] OR;  Service: Vascular;  Laterality: Right;  . LIGATION ARTERIOVENOUS GORTEX GRAFT Left 03/23/2015   Procedure: LIGATION AND EXCISION OF LEFT ARTERIOVENOUS GORTEX GRAFT;  Surgeon: Fransisco Hertz, MD;  Location: [facility] OR;  Service: Vascular;  Laterality: Left;  . REVERSE SHOULDER ARTHROPLASTY  09/22/2011  Procedure: REVERSE SHOULDER ARTHROPLASTY;  Surgeon: Verlee Rossetti, MD;  Location: [facility] OR;  Service: Orthopedics;  Laterality: Left;  left reverse shoulder arthroplasty  . REVISION OF ARTERIOVENOUS GORETEX GRAFT Left 01/12/2015   Procedure: REVISION OF Left FOREARM ARTERIOVENOUS GORETEX GRAFT;  Surgeon: Fransisco Hertz, MD;  Location: [facility] OR;  Service: Vascular;  Laterality: Left;  . SHOULDER SURGERY  3-17yrs ago   right replacement  . SHUNTOGRAM Left 08/29/2012   Procedure: SHUNTOGRAM;  Surgeon: Fransisco Hertz, MD;  Location: [facility] CATH LAB;  Service: Cardiovascular;  Laterality: Left;  arm     reports that she has never smoked. Her smokeless tobacco use includes Chew. She reports that she does not drink alcohol or use drugs.  Allergies  Allergen Reactions  . Banana Other (See Comments)    Due to dialysis  . Chocolate Other (See Comments)    Due to dialysis  . Fish-Derived Products Other (See Comments)    Due to gout    Family History  Problem Relation Age of Onset  . Hypertension Mother   . Diabetes Mother   . Cancer Brother     Prior to Admission medications   Medication Sig Start Date End Date Taking? Authorizing Provider  acetaminophen (TYLENOL) 500 MG tablet Take 500 mg by mouth every 6 (six) hours as needed for moderate pain.    Historical Provider, MD  amLODipine (NORVASC) 10 MG tablet Take 1 tablet (10 mg total) by mouth daily. 11/08/15   Linwood Dibbles, MD  HYDROcodone-acetaminophen (NORCO/VICODIN) 5-325 MG tablet Take 1 tablet by mouth every 6 (six) hours as needed for moderate pain. 08/20/15   Costin Otelia Sergeant, MD  lactulose (CHRONULAC) 10 GM/15ML solution Take 45 mLs (30 g total) by mouth 2 (two) times daily. 06/18/15   Rolly Salter, MD  levothyroxine (SYNTHROID, LEVOTHROID) 50 MCG tablet Take 50 mcg by mouth daily before breakfast.  04/15/15   Historical Provider, MD  pantoprazole (PROTONIX) 40 MG tablet Take 1 tablet (40 mg total) by mouth 2 (two) times daily before a meal. 06/18/15   Rolly Salter, MD    Physical Exam: Vitals:   08/18/16 1245 08/18/16 1300 08/18/16 1315 08/18/16 1330  BP: 137/75 139/77 128/71 124/64  Pulse: 94 94 94 93  Resp:    20  Temp:      TempSrc:      SpO2: 98% 99% 98% 99%    Constitutional: NAD, calm, comfortable Vitals:   08/18/16 1245 08/18/16 1300 08/18/16 1315 08/18/16 1330  BP: 137/75 139/77 128/71 124/64  Pulse: 94 94 94 93  Resp:    20  Temp:      TempSrc:      SpO2: 98% 99% 98% 99%   Eyes: PERRL, lids and conjunctivae  normal ENMT: Mucous membranes are moist. Posterior pharynx clear of any exudate or lesions.Normal dentition. Extremely hard of hearing and had to shout into her left ear. Neck: normal, supple, no masses, no thyromegaly Respiratory: clear to auscultation bilaterally, no wheezing, no crackles. Normal respiratory effort. No accessory muscle use.  Cardiovascular: Regular rate and rhythm, no murmurs / rubs / gallops. No extremity edema. 2+ pedal pulses. No carotid bruits.  Abdomen: no tenderness, no masses palpated. No hepatosplenomegaly. Bowel sounds positive.  Musculoskeletal: no clubbing / cyanosis. No joint deformity upper and lower extremities. Good ROM, no contractures. Normal muscle tone.  Skin: no rashes, lesions, ulcers. No induration Neurologic: CN 2-12 grossly intact. Sensation intact, DTR normal. Strength 5/5 in all  4.  Psychiatric: Appears normal but difficult to engage in any conversation.   Labs on Admission: I have personally reviewed following labs and imaging studies  CBC:  Recent Labs Lab 08/18/16 1233  WBC 6.1  NEUTROABS 4.9  HGB 9.9*  HCT 29.9*  MCV 93.1  PLT 104*   Basic Metabolic Panel:  Recent Labs Lab 08/18/16 1233  NA 128*  K 4.1  CL 91*  CO2 23  GLUCOSE 80  BUN 23*  CREATININE 7.18*  CALCIUM 9.1   GFR: CrCl cannot be calculated (Unknown ideal weight.). Liver Function Tests: No results for input(s): AST, ALT, ALKPHOS, BILITOT, PROT, ALBUMIN in the last 168 hours. No results for input(s): LIPASE, AMYLASE in the last 168 hours. No results for input(s): AMMONIA in the last 168 hours. Coagulation Profile: No results for input(s): INR, PROTIME in the last 168 hours. Cardiac Enzymes: No results for input(s): CKTOTAL, CKMB, CKMBINDEX, TROPONINI in the last 168 hours. BNP (last 3 results) No results for input(s): PROBNP in the last 8760 hours. HbA1C: No results for input(s): HGBA1C in the last 72 hours. CBG: No results for input(s): GLUCAP in the  last 168 hours. Lipid Profile: No results for input(s): CHOL, HDL, LDLCALC, TRIG, CHOLHDL, LDLDIRECT in the last 72 hours. Thyroid Function Tests: No results for input(s): TSH, T4TOTAL, FREET4, T3FREE, THYROIDAB in the last 72 hours. Anemia Panel: No results for input(s): VITAMINB12, FOLATE, FERRITIN, TIBC, IRON, RETICCTPCT in the last 72 hours. Urine analysis:    Component Value Date/Time   COLORURINE AMBER (A) 08/03/2014 1249   APPEARANCEUR CLOUDY (A) 08/03/2014 1249   LABSPEC 1.020 08/03/2014 1249   PHURINE 7.0 08/03/2014 1249   GLUCOSEU 500 (A) 08/03/2014 1249   HGBUR LARGE (A) 08/03/2014 1249   BILIRUBINUR MODERATE (A) 08/03/2014 1249   KETONESUR 15 (A) 08/03/2014 1249   PROTEINUR >300 (A) 08/03/2014 1249   UROBILINOGEN 4.0 (H) 08/03/2014 1249   NITRITE POSITIVE (A) 08/03/2014 1249   LEUKOCYTESUR TRACE (A) 08/03/2014 1249    Radiological Exams on Admission: Dg Forearm Left  Result Date: 08/18/2016 CLINICAL DATA:  71 year old with prior left shoulder arthroplasty who fell earlier today. Obvious deformity of the upper arm. Initial encounter. EXAM: LEFT FOREARM - 2 VIEW COMPARISON:  No prior left forearm imaging. FINDINGS: Soft tissue swelling/ecchymosis involving the dorsal surface of the proximal forearm. No acute fractures involving the radius or ulna. Visualized elbow joint intact. Degenerative changes at the visualized wrist joint. Osseous demineralization. Left forearm dialysis graft. IMPRESSION: 1. No acute osseous abnormality. 2. Osseous demineralization. 3. Soft tissue swelling/ecchymosis involving the dorsal surface of the proximal forearm. Electronically Signed   By: Hulan Saas M.D.   On: 08/18/2016 11:31   Ct Head Wo Contrast  Result Date: 08/18/2016 CLINICAL DATA:  Pain following fall EXAM: CT HEAD WITHOUT CONTRAST CT CERVICAL SPINE WITHOUT CONTRAST TECHNIQUE: Multidetector CT imaging of the head and cervical spine was performed following the standard protocol  without intravenous contrast. Multiplanar CT image reconstructions of the cervical spine were also generated. COMPARISON:  Head CT August 14, 2015 and brain MRI Aug 17, 2015 FINDINGS: CT HEAD FINDINGS Brain: Diffuse atrophy is stable compared to prior studies. There is no intracranial mass, hemorrhage, extra-axial fluid collection, or midline shift. There is patchy small vessel disease in the centra semiovale bilaterally. No acute infarct is demonstrable. Vascular: No hyperdense vessel. There is calcification in each carotid siphon region. Skull: Bony calvarium appears intact. Sinuses/Orbits: There is mucosal thickening in multiple ethmoid air cells  bilaterally. Visualized paranasal sinuses elsewhere clear. Orbits appear symmetric bilaterally. Other: Mastoid air cells are clear. CT CERVICAL SPINE FINDINGS Alignment: There is cervical levoscoliosis. No spondylolisthesis is evident. Skull base and vertebrae: The skull base and craniocervical junction regions appear unremarkable. There is pannus posterior to the odontoid which is not causing significant impression on the craniocervical junction. Bones are osteoporotic. There is no evident fracture. There are no blastic or lytic bone lesions. Soft tissues and spinal canal: Prevertebral soft tissues and predental space regions are normal. No paraspinous lesions are evident. There is no cord or canal hematoma appreciable. There is a small calcified meningioma posteriorly at C6-7 measuring 5 x 6 mm without surrounding edema or mass effect. Disc levels: There is disc space narrowing at all levels, most marked at C2-3, C4-5, and C5-6. There are multiple cystic areas in the endplates at all levels. There is also cystic change in the odontoid. There is facet arthropathy at essentially all levels bilaterally. There is no disc extrusion or stenosis. Upper chest: Visualized upper lung zones appear unremarkable. There is calcification in the right subclavian artery. Other: There is  arthropathy in both temporomandibular joints. There is calcification in both carotid arteries. IMPRESSION: CT head: Diffuse atrophy with patchy periventricular small vessel disease, stable. No intracranial mass hemorrhage, or extra-axial fluid collection. No evident acute infarct. There are foci of arteriovascular calcification. There is mucosal thickening in multiple ethmoid air cells. CT cervical spine: No fracture or spondylolisthesis. There is cervical levoscoliosis. There is extensive multilevel arthropathy. There is arthropathy in both temporomandibular joints. There is carotid artery calcification bilaterally as well as right subclavian artery calcification. There is a small calcified meningioma posteriorly at C6-7 without edema or appreciable mass effect. Electronically Signed   By: Bretta BangWilliam  Woodruff III M.D.   On: 08/18/2016 11:53   Ct Cervical Spine Wo Contrast  Result Date: 08/18/2016 CLINICAL DATA:  Pain following fall EXAM: CT HEAD WITHOUT CONTRAST CT CERVICAL SPINE WITHOUT CONTRAST TECHNIQUE: Multidetector CT imaging of the head and cervical spine was performed following the standard protocol without intravenous contrast. Multiplanar CT image reconstructions of the cervical spine were also generated. COMPARISON:  Head CT August 14, 2015 and brain MRI Aug 17, 2015 FINDINGS: CT HEAD FINDINGS Brain: Diffuse atrophy is stable compared to prior studies. There is no intracranial mass, hemorrhage, extra-axial fluid collection, or midline shift. There is patchy small vessel disease in the centra semiovale bilaterally. No acute infarct is demonstrable. Vascular: No hyperdense vessel. There is calcification in each carotid siphon region. Skull: Bony calvarium appears intact. Sinuses/Orbits: There is mucosal thickening in multiple ethmoid air cells bilaterally. Visualized paranasal sinuses elsewhere clear. Orbits appear symmetric bilaterally. Other: Mastoid air cells are clear. CT CERVICAL SPINE FINDINGS  Alignment: There is cervical levoscoliosis. No spondylolisthesis is evident. Skull base and vertebrae: The skull base and craniocervical junction regions appear unremarkable. There is pannus posterior to the odontoid which is not causing significant impression on the craniocervical junction. Bones are osteoporotic. There is no evident fracture. There are no blastic or lytic bone lesions. Soft tissues and spinal canal: Prevertebral soft tissues and predental space regions are normal. No paraspinous lesions are evident. There is no cord or canal hematoma appreciable. There is a small calcified meningioma posteriorly at C6-7 measuring 5 x 6 mm without surrounding edema or mass effect. Disc levels: There is disc space narrowing at all levels, most marked at C2-3, C4-5, and C5-6. There are multiple cystic areas in the endplates at all levels.  There is also cystic change in the odontoid. There is facet arthropathy at essentially all levels bilaterally. There is no disc extrusion or stenosis. Upper chest: Visualized upper lung zones appear unremarkable. There is calcification in the right subclavian artery. Other: There is arthropathy in both temporomandibular joints. There is calcification in both carotid arteries. IMPRESSION: CT head: Diffuse atrophy with patchy periventricular small vessel disease, stable. No intracranial mass hemorrhage, or extra-axial fluid collection. No evident acute infarct. There are foci of arteriovascular calcification. There is mucosal thickening in multiple ethmoid air cells. CT cervical spine: No fracture or spondylolisthesis. There is cervical levoscoliosis. There is extensive multilevel arthropathy. There is arthropathy in both temporomandibular joints. There is carotid artery calcification bilaterally as well as right subclavian artery calcification. There is a small calcified meningioma posteriorly at C6-7 without edema or appreciable mass effect. Electronically Signed   By: Bretta Bang III M.D.   On: 08/18/2016 11:53   Dg Shoulder Left  Result Date: 08/18/2016 CLINICAL DATA:  71 year old with prior left shoulder arthroplasty who fell today and injured the left shoulder. Obvious deformity. Initial encounter. EXAM: LEFT SHOULDER - 2+ VIEW COMPARISON:  Bone window images from CT of the left upper extremity 12/27/2014. Left shoulder x-rays 09/22/2011, 03/15/2012. CT left shoulder 05/09/2011. FINDINGS: Prior left shoulder arthroplasty with anatomic alignment of the prosthesis. Acute traumatic comminuted fracture involving the proximal humeral metaphysis at the level of the distal portion of the humeral component of the prosthesis. Acromioclavicular joint intact with degenerative changes. IMPRESSION: 1. Acute comminuted fracture involving the proximal humeral metaphysis. The fracture is at the level of the distal portion of the humeral component of the left shoulder prosthesis. 2. Left shoulder prosthesis with anatomic alignment. Electronically Signed   By: Hulan Saas M.D.   On: 08/18/2016 11:29   Dg Humerus Left  Result Date: 08/18/2016 CLINICAL DATA:  71 year old female with a history of fall and pain. EXAM: LEFT HUMERUS - 2+ VIEW COMPARISON:  12/27/2014, 09/22/2011 FINDINGS: Acute fracture of the left humerus at the distal aspect of the surgical hardware, mid humerus. Surgical changes of total left shoulder reverse arthroplasty. Osteopenia. Swelling at fracture site IMPRESSION: Acute fracture of the humerus in the midshaft at the distal aspect of the surgical hardware. Associated soft tissue swelling. Electronically Signed   By: Gilmer Mor D.O.   On: 08/18/2016 11:49    EKG: Independently reviewed. Normal sinus rhythm normal EKG with normal intervals and axes  Assessment/Plan Principal Problem:   Left supracondylar humerus fracture, closed, initial encounter Active Problems:   ESRD on hemodialysis (HCC)   HTN (hypertension)   Shoulder pain   Anemia in chronic  kidney disease (CKD)     Patient will be admitted into the hospital for treatment of left supracondylar humerus fracture closed with initial encounter. She will be seen by nephrology for treatment of end-stage renal disease with hemodialysis on Monday Wednesdays and Fridays. She did miss today's hemodialysis. Emergency practitioner has spoken with nephrology with regards to obtaining hemodialysis as soon as possible. Next hypertension blood pressure appears to be well controlled. In May of last year she was started on Midrin. She is no longer taking this medication it appears that whatever was causing her to be hypotensive has resolved. Shoulder pain is likely due to her humerus fracture which is just below where her surgery was performed for a shoulder replacement. Metastases on the left side. Regards to in the anemia of chronic kidney disease we'll continue to monitor hemoglobin is  9.9.  DVT prophylaxis: Lovenox  Code Status: Do not attempt resuscitation Family Communication: Patient asking about her grandson he was not available at the time of my evaluation apparently he had left by them. Disposition Plan: Home once stable although if patient cannot tolerate movement she may require skilled nursing evaluation. Consults called: Nephrology and orthopedic surgery. Both called by the emergency department. Admission status: Observation.   Lahoma Crocker MD FACP [facility] Pager [phone]  If 7PM-7AM, please contact night-coverage [email or web address] Password [email or web address]  08/18/2016, 2:20 PM

## 2016-08-18 NOTE — ED Provider Notes (Signed)
[facility]-EMERGENCY DEPT Provider Note   CSN: [id number] Arrival date & time: 08/18/16  1014     History   Chief Complaint Chief Complaint  Patient presents with  . Arm Injury    HPI Samantha [name] is a 71 y.o. female who presents with left arm pain after falling yesterday. Patient states that her cane and she fell on her left arm. Patient reports that she was unable to get up. She denies any pain elsewhere at this time. Patient has dialysis Monday Wednesday and Friday and missed today's. Patient currently denies any chest pain, shortness of breath, abdominal pain, nausea, vomiting, back pain.  HPI  Past Medical History:  Diagnosis Date  . Anemia   . Arthritis    left shoulder  . Constipation   . Diabetes mellitus    borderline  . Dizziness   . Dry skin   . Gastric ulcer   . H/O: GI bleed   . Headache(784.0)    occasionally  . Hepatitis    Hep B  . History of blood transfusion   . History of kidney stones   . Hypertension   . Hypothyroidism    takes Synthroid daily  . Impaired hearing   . Joint pain   . Joint swelling   . Occasional numbness/prickling/tingling of fingers and toes   . Oligouria   . Peripheral edema   . Renal disorder    m, w, F   . Shoulder pain     Patient Active Problem List   Diagnosis Date Noted  . Left supracondylar humerus fracture, closed, initial encounter 08/18/2016  . Vaginal bleeding 08/15/2015  . Adnexal mass   . ESRD on dialysis (HCC)   . Lactic acidosis   . Neutropenia (HCC) 06/15/2015  . Acute encephalopathy 06/14/2015  . Sepsis (HCC) 06/14/2015  . 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 Arteriovenous graft infection (HCC)   . Gram-negative bacteremia   . Cirrhosis of liver (HCC) 12/26/2014  . Thrombocytopenia (HCC) 12/25/2014  . 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 Cardiomegaly 12/25/2014  . Bacteremia 12/25/2014  . Cellulitis of left upper extremity   . Mechanical complication of  other vascular device, implant, and graft 10/08/2013  . End stage renal disease (HCC) 01/16/2013  . Hyponatremia 04/12/2012  . Pancreatitis 04/10/2012  . Hypotension 04/09/2012  . SOB (shortness of breath) 04/08/2012  . Abdominal pain 04/08/2012  . Symptomatic cholelithiasis 11/16/2011  . Acute blood loss anemia 06/08/2011  . Transaminitis 06/08/2011  . Hepatitis B antibody positive 06/08/2011  . GI bleed 06/06/2011  . ESRD on hemodialysis (HCC) 06/06/2011  . HTN (hypertension) 06/06/2011  . Shoulder pain 06/06/2011  . RUQ abdominal tenderness 06/06/2011  . Anemia in chronic kidney disease (CKD) 06/06/2011    Past Surgical History:  Procedure Laterality Date  . ARTERIOVENOUS GRAFT PLACEMENT Left    forearm  . cataract surgery     bilateral  . CHOLECYSTECTOMY  12/26/2011  . CHOLECYSTECTOMY  12/26/2011   Procedure: LAPAROSCOPIC CHOLECYSTECTOMY;  Surgeon: Atilano Ina, MD,FACS;  Location: [facility] OR;  Service: General;  Laterality: N/A;  . ESOPHAGOGASTRODUODENOSCOPY  06/09/2011   Procedure: ESOPHAGOGASTRODUODENOSCOPY (EGD);  Surgeon: Theda Belfast, MD;  Location[facility] ENDOSCOPY;  Service: Endoscopy;  Laterality: N/A;  . I&D EXTREMITY Left 03/23/2015   Procedure: DRAINAGE OF LEFT ARM SEROMA;  Surgeon: Fransisco Hertz, MD;  Location: [facility] OR;  Service: Vascular;  Laterality: Left;  . INSERTION OF DIALYSIS CATHETER Right 01/12/2015  Procedure: INSERTION OF DIALYSIS CATHETER;  Surgeon: Fransisco Hertz, MD;  Location: [facility] OR;  Service: Vascular;  Laterality: Right;  . LIGATION ARTERIOVENOUS GORTEX GRAFT Left 03/23/2015   Procedure: LIGATION AND EXCISION OF LEFT ARTERIOVENOUS GORTEX GRAFT;  Surgeon: Fransisco Hertz, MD;  Location: [facility] OR;  Service: Vascular;  Laterality: Left;  . REVERSE SHOULDER ARTHROPLASTY  09/22/2011   Procedure: REVERSE SHOULDER ARTHROPLASTY;  Surgeon: Verlee Rossetti, MD;  Location: [facility] OR;  Service: Orthopedics;  Laterality: Left;  left reverse shoulder arthroplasty  . REVISION OF  ARTERIOVENOUS GORETEX GRAFT Left 01/12/2015   Procedure: REVISION OF Left FOREARM ARTERIOVENOUS GORETEX GRAFT;  Surgeon: Fransisco Hertz, MD;  Location: [facility] OR;  Service: Vascular;  Laterality: Left;  . SHOULDER SURGERY  3-54yrs ago   right replacement  . SHUNTOGRAM Left 08/29/2012   Procedure: SHUNTOGRAM;  Surgeon: Fransisco Hertz, MD;  Location: [facility] CATH LAB;  Service: Cardiovascular;  Laterality: Left;  arm    OB History    No data available       Home Medications    Prior to Admission medications   Medication Sig Start Date End Date Taking? Authorizing Provider  acetaminophen (TYLENOL) 500 MG tablet Take 500 mg by mouth every 6 (six) hours as needed for moderate pain.    Historical Provider, MD  amLODipine (NORVASC) 10 MG tablet Take 1 tablet (10 mg total) by mouth daily. 11/08/15   Linwood Dibbles, MD  HYDROcodone-acetaminophen (NORCO/VICODIN) 5-325 MG tablet Take 1 tablet by mouth every 6 (six) hours as needed for moderate pain. 08/20/15   Costin Otelia Sergeant, MD  lactulose (CHRONULAC) 10 GM/15ML solution Take 45 mLs (30 g total) by mouth 2 (two) times daily. 06/18/15   Rolly Salter, MD  levothyroxine (SYNTHROID, LEVOTHROID) 50 MCG tablet Take 50 mcg by mouth daily before breakfast.  04/15/15   Historical Provider, MD  pantoprazole (PROTONIX) 40 MG tablet Take 1 tablet (40 mg total) by mouth 2 (two) times daily before a meal. 06/18/15   Rolly Salter, MD    Family History Family History  Problem Relation Age of Onset  . Hypertension Mother   . Diabetes Mother   . Cancer Brother     Social History Social History  Substance Use Topics  . Smoking status: Never Smoker  . Smokeless tobacco: Current User    Types: Chew  . Alcohol use No     Comment: quit 4-12yrs ago     Allergies   Banana; Chocolate; and Fish-derived products   Review of Systems Review of Systems  Constitutional: Negative for chills and fever.  HENT: Negative for facial swelling and sore throat.   Respiratory: Negative  for shortness of breath.   Cardiovascular: Negative for chest pain.  Gastrointestinal: Negative for abdominal pain, nausea and vomiting.  Genitourinary: Negative for dysuria.  Musculoskeletal: Positive for arthralgias and joint swelling. Negative for back pain and neck pain.  Skin: Negative for rash and wound.  Neurological: Negative for headaches.  Psychiatric/Behavioral: The patient is not nervous/anxious.      Physical Exam Updated Vital Signs BP 124/64   Pulse 93   Temp 97.7 F (36.5 C) (Oral)   Resp 20   SpO2 99%   Physical Exam  Constitutional: She appears well-developed and well-nourished. No distress.  HENT:  Head: Normocephalic and atraumatic.  Mouth/Throat: Oropharynx is clear and moist. No oropharyngeal exudate.  Eyes: Conjunctivae are normal. Pupils are equal, round, and reactive to light. Right eye exhibits no discharge.  Left eye exhibits no discharge. No scleral icterus.  Neck: Normal range of motion. Neck supple. No thyromegaly present.  Cardiovascular: Normal rate, regular rhythm, normal heart sounds and intact distal pulses.  Exam reveals no gallop and no friction rub.   No murmur heard. Pulmonary/Chest: Effort normal and breath sounds normal. No stridor. No respiratory distress. She has no wheezes. She has no rales.  Abdominal: Soft. Bowel sounds are normal. She exhibits no distension. There is no tenderness. There is no rebound and no guarding.  Musculoskeletal: She exhibits no edema.       Left shoulder: She exhibits decreased range of motion, tenderness, bony tenderness and swelling.  L shoulder and humerus severe tenderness; mild to moderate tenderness at the left elbow and forearm; dialysis fistula palpated; no radial pulse palpated  Lymphadenopathy:    She has no cervical adenopathy.  Neurological: She is alert. Coordination normal.  Skin: Skin is warm and dry. No rash noted. She is not diaphoretic. No pallor.  Psychiatric: She has a normal mood and  affect.  Nursing note and vitals reviewed.    ED Treatments / Results  Labs (all labs ordered are listed, but only abnormal results are displayed) Labs Reviewed  BASIC METABOLIC PANEL - Abnormal; Notable for the following:       Result Value   Sodium 128 (*)    Chloride 91 (*)    BUN 23 (*)    Creatinine, Ser 7.18 (*)    GFR calc non Af Amer 5 (*)    GFR calc Af Amer 6 (*)    All other components within normal limits  CBC WITH DIFFERENTIAL/PLATELET - Abnormal; Notable for the following:    RBC 3.21 (*)    Hemoglobin 9.9 (*)    HCT 29.9 (*)    RDW 16.2 (*)    Platelets 104 (*)    All other components within normal limits  CREATININE, SERUM    EKG  EKG Interpretation None       Radiology Dg Forearm Left  Result Date: 08/18/2016 CLINICAL DATA:  71 year old with prior left shoulder arthroplasty who fell earlier today. Obvious deformity of the upper arm. Initial encounter. EXAM: LEFT FOREARM - 2 VIEW COMPARISON:  No prior left forearm imaging. FINDINGS: Soft tissue swelling/ecchymosis involving the dorsal surface of the proximal forearm. No acute fractures involving the radius or ulna. Visualized elbow joint intact. Degenerative changes at the visualized wrist joint. Osseous demineralization. Left forearm dialysis graft. IMPRESSION: 1. No acute osseous abnormality. 2. Osseous demineralization. 3. Soft tissue swelling/ecchymosis involving the dorsal surface of the proximal forearm. Electronically Signed   By: Hulan Saashomas  Lawrence M.D.   On: 08/18/2016 11:31   Ct Head Wo Contrast  Result Date: 08/18/2016 CLINICAL DATA:  Pain following fall EXAM: CT HEAD WITHOUT CONTRAST CT CERVICAL SPINE WITHOUT CONTRAST TECHNIQUE: Multidetector CT imaging of the head and cervical spine was performed following the standard protocol without intravenous contrast. Multiplanar CT image reconstructions of the cervical spine were also generated. COMPARISON:  Head CT August 14, 2015 and brain MRI Aug 17, 2015  FINDINGS: CT HEAD FINDINGS Brain: Diffuse atrophy is stable compared to prior studies. There is no intracranial mass, hemorrhage, extra-axial fluid collection, or midline shift. There is patchy small vessel disease in the centra semiovale bilaterally. No acute infarct is demonstrable. Vascular: No hyperdense vessel. There is calcification in each carotid siphon region. Skull: Bony calvarium appears intact. Sinuses/Orbits: There is mucosal thickening in multiple ethmoid air cells bilaterally. Visualized paranasal sinuses  elsewhere clear. Orbits appear symmetric bilaterally. Other: Mastoid air cells are clear. CT CERVICAL SPINE FINDINGS Alignment: There is cervical levoscoliosis. No spondylolisthesis is evident. Skull base and vertebrae: The skull base and craniocervical junction regions appear unremarkable. There is pannus posterior to the odontoid which is not causing significant impression on the craniocervical junction. Bones are osteoporotic. There is no evident fracture. There are no blastic or lytic bone lesions. Soft tissues and spinal canal: Prevertebral soft tissues and predental space regions are normal. No paraspinous lesions are evident. There is no cord or canal hematoma appreciable. There is a small calcified meningioma posteriorly at C6-7 measuring 5 x 6 mm without surrounding edema or mass effect. Disc levels: There is disc space narrowing at all levels, most marked at C2-3, C4-5, and C5-6. There are multiple cystic areas in the endplates at all levels. There is also cystic change in the odontoid. There is facet arthropathy at essentially all levels bilaterally. There is no disc extrusion or stenosis. Upper chest: Visualized upper lung zones appear unremarkable. There is calcification in the right subclavian artery. Other: There is arthropathy in both temporomandibular joints. There is calcification in both carotid arteries. IMPRESSION: CT head: Diffuse atrophy with patchy periventricular small vessel  disease, stable. No intracranial mass hemorrhage, or extra-axial fluid collection. No evident acute infarct. There are foci of arteriovascular calcification. There is mucosal thickening in multiple ethmoid air cells. CT cervical spine: No fracture or spondylolisthesis. There is cervical levoscoliosis. There is extensive multilevel arthropathy. There is arthropathy in both temporomandibular joints. There is carotid artery calcification bilaterally as well as right subclavian artery calcification. There is a small calcified meningioma posteriorly at C6-7 without edema or appreciable mass effect. Electronically Signed   By: Bretta Bang III M.D.   On: 08/18/2016 11:53   Ct Cervical Spine Wo Contrast  Result Date: 08/18/2016 CLINICAL DATA:  Pain following fall EXAM: CT HEAD WITHOUT CONTRAST CT CERVICAL SPINE WITHOUT CONTRAST TECHNIQUE: Multidetector CT imaging of the head and cervical spine was performed following the standard protocol without intravenous contrast. Multiplanar CT image reconstructions of the cervical spine were also generated. COMPARISON:  Head CT August 14, 2015 and brain MRI Aug 17, 2015 FINDINGS: CT HEAD FINDINGS Brain: Diffuse atrophy is stable compared to prior studies. There is no intracranial mass, hemorrhage, extra-axial fluid collection, or midline shift. There is patchy small vessel disease in the centra semiovale bilaterally. No acute infarct is demonstrable. Vascular: No hyperdense vessel. There is calcification in each carotid siphon region. Skull: Bony calvarium appears intact. Sinuses/Orbits: There is mucosal thickening in multiple ethmoid air cells bilaterally. Visualized paranasal sinuses elsewhere clear. Orbits appear symmetric bilaterally. Other: Mastoid air cells are clear. CT CERVICAL SPINE FINDINGS Alignment: There is cervical levoscoliosis. No spondylolisthesis is evident. Skull base and vertebrae: The skull base and craniocervical junction regions appear unremarkable. There  is pannus posterior to the odontoid which is not causing significant impression on the craniocervical junction. Bones are osteoporotic. There is no evident fracture. There are no blastic or lytic bone lesions. Soft tissues and spinal canal: Prevertebral soft tissues and predental space regions are normal. No paraspinous lesions are evident. There is no cord or canal hematoma appreciable. There is a small calcified meningioma posteriorly at C6-7 measuring 5 x 6 mm without surrounding edema or mass effect. Disc levels: There is disc space narrowing at all levels, most marked at C2-3, C4-5, and C5-6. There are multiple cystic areas in the endplates at all levels. There is also cystic  change in the odontoid. There is facet arthropathy at essentially all levels bilaterally. There is no disc extrusion or stenosis. Upper chest: Visualized upper lung zones appear unremarkable. There is calcification in the right subclavian artery. Other: There is arthropathy in both temporomandibular joints. There is calcification in both carotid arteries. IMPRESSION: CT head: Diffuse atrophy with patchy periventricular small vessel disease, stable. No intracranial mass hemorrhage, or extra-axial fluid collection. No evident acute infarct. There are foci of arteriovascular calcification. There is mucosal thickening in multiple ethmoid air cells. CT cervical spine: No fracture or spondylolisthesis. There is cervical levoscoliosis. There is extensive multilevel arthropathy. There is arthropathy in both temporomandibular joints. There is carotid artery calcification bilaterally as well as right subclavian artery calcification. There is a small calcified meningioma posteriorly at C6-7 without edema or appreciable mass effect. Electronically Signed   By: Bretta Bang III M.D.   On: 08/18/2016 11:53   Dg Shoulder Left  Result Date: 08/18/2016 CLINICAL DATA:  71 year old with prior left shoulder arthroplasty who fell today and injured the  left shoulder. Obvious deformity. Initial encounter. EXAM: LEFT SHOULDER - 2+ VIEW COMPARISON:  Bone window images from CT of the left upper extremity 12/27/2014. Left shoulder x-rays 09/22/2011, 03/15/2012. CT left shoulder 05/09/2011. FINDINGS: Prior left shoulder arthroplasty with anatomic alignment of the prosthesis. Acute traumatic comminuted fracture involving the proximal humeral metaphysis at the level of the distal portion of the humeral component of the prosthesis. Acromioclavicular joint intact with degenerative changes. IMPRESSION: 1. Acute comminuted fracture involving the proximal humeral metaphysis. The fracture is at the level of the distal portion of the humeral component of the left shoulder prosthesis. 2. Left shoulder prosthesis with anatomic alignment. Electronically Signed   By: Hulan Saas M.D.   On: 08/18/2016 11:29   Dg Humerus Left  Result Date: 08/18/2016 CLINICAL DATA:  71 year old female with a history of fall and pain. EXAM: LEFT HUMERUS - 2+ VIEW COMPARISON:  12/27/2014, 09/22/2011 FINDINGS: Acute fracture of the left humerus at the distal aspect of the surgical hardware, mid humerus. Surgical changes of total left shoulder reverse arthroplasty. Osteopenia. Swelling at fracture site IMPRESSION: Acute fracture of the humerus in the midshaft at the distal aspect of the surgical hardware. Associated soft tissue swelling. Electronically Signed   By: Gilmer Mor D.O.   On: 08/18/2016 11:49    Procedures Procedures (including critical care time)  Medications Ordered in ED Medications  amLODipine (NORVASC) tablet 10 mg (not administered)  HYDROcodone-acetaminophen (NORCO/VICODIN) 5-325 MG per tablet 1 tablet (not administered)  lactulose (CHRONULAC) 10 GM/15ML solution 30 g (not administered)  levothyroxine (SYNTHROID, LEVOTHROID) tablet 50 mcg (not administered)  pantoprazole (PROTONIX) EC tablet 40 mg (not administered)  heparin injection 5,000 Units (not  administered)  acetaminophen (TYLENOL) tablet 650 mg (not administered)    Or  acetaminophen (TYLENOL) suppository 650 mg (not administered)  oxyCODONE (Oxy IR/ROXICODONE) immediate release tablet 5 mg (not administered)  ketorolac (TORADOL) 15 MG/ML injection 15 mg (not administered)  zolpidem (AMBIEN) tablet 5 mg (not administered)  senna-docusate (Senokot-S) tablet 1 tablet (not administered)  sodium phosphate (FLEET) 7-19 GM/118ML enema 1 enema (not administered)  fentaNYL (SUBLIMAZE) injection 50 mcg (50 mcg Intravenous Given 08/18/16 1241)     Initial Impression / Assessment and Plan / ED Course  I have reviewed the triage vital signs and the nursing notes.  Pertinent labs & imaging results that were available during my care of the patient were reviewed by me and considered in my medical  decision making (see chart for details).     Patient with acute fracture of the humerus in the mid shaft at the distal aspect of the surgical hardware (status post left shoulder arthroplasty). Patient is neuro intact. I consulted orthopedics and spoke with Charma Igo, PA-C, who will manage the patient's fracture and have medicine admit. I spoke with Dr. Willette Pa with [facility] who will admit the patient for medical management. I consulted Dr. Arlean Hopping with nephrology who will consult and arrange for dialysis during patient's admission.  Final Clinical Impressions(s) / ED Diagnoses   Final diagnoses:  Closed displaced comminuted fracture of shaft of left humerus, initial encounter    New Prescriptions New Prescriptions   No medications on file     Emi Holes, PA-C 08/18/16 1417    Gerhard Munch, MD 08/22/16 [phone]

## 2016-08-18 NOTE — ED Triage Notes (Signed)
Pt arrives via EMS from home with fall yesterday. Pt with deformity and severe pain to left upper arm and shoulder area. Pt dialysis, MWF missed todays treatment. Pt HOH, appears at baseline neuro with VSS.

## 2016-08-19 ENCOUNTER — Encounter (HOSPITAL_COMMUNITY): Payer: Self-pay | Admitting: Internal Medicine

## 2016-08-19 DIAGNOSIS — Z992 Dependence on renal dialysis: Secondary | ICD-10-CM

## 2016-08-19 DIAGNOSIS — D631 Anemia in chronic kidney disease: Secondary | ICD-10-CM

## 2016-08-19 DIAGNOSIS — N2889 Other specified disorders of kidney and ureter: Secondary | ICD-10-CM

## 2016-08-19 DIAGNOSIS — N2581 Secondary hyperparathyroidism of renal origin: Secondary | ICD-10-CM

## 2016-08-19 DIAGNOSIS — S42412A Displaced simple supracondylar fracture without intercondylar fracture of left humerus, initial encounter for closed fracture: Secondary | ICD-10-CM | POA: Diagnosis not present

## 2016-08-19 DIAGNOSIS — N186 End stage renal disease: Secondary | ICD-10-CM | POA: Diagnosis not present

## 2016-08-19 DIAGNOSIS — S42352A Displaced comminuted fracture of shaft of humerus, left arm, initial encounter for closed fracture: Secondary | ICD-10-CM | POA: Diagnosis not present

## 2016-08-19 DIAGNOSIS — M25512 Pain in left shoulder: Secondary | ICD-10-CM

## 2016-08-19 DIAGNOSIS — K7460 Unspecified cirrhosis of liver: Secondary | ICD-10-CM | POA: Diagnosis not present

## 2016-08-19 DIAGNOSIS — E871 Hypo-osmolality and hyponatremia: Secondary | ICD-10-CM

## 2016-08-19 DIAGNOSIS — B1910 Unspecified viral hepatitis B without hepatic coma: Secondary | ICD-10-CM

## 2016-08-19 DIAGNOSIS — I151 Hypertension secondary to other renal disorders: Secondary | ICD-10-CM

## 2016-08-19 DIAGNOSIS — D696 Thrombocytopenia, unspecified: Secondary | ICD-10-CM

## 2016-08-19 HISTORY — DX: Secondary hyperparathyroidism of renal origin: N25.81

## 2016-08-19 LAB — CBC
HEMATOCRIT: 28.8 % — AB (ref 36.0–46.0)
HEMOGLOBIN: 9.2 g/dL — AB (ref 12.0–15.0)
MCH: 29.7 pg (ref 26.0–34.0)
MCHC: 31.9 g/dL (ref 30.0–36.0)
MCV: 92.9 fL (ref 78.0–100.0)
Platelets: 122 10*3/uL — ABNORMAL LOW (ref 150–400)
RBC: 3.1 MIL/uL — AB (ref 3.87–5.11)
RDW: 16.1 % — ABNORMAL HIGH (ref 11.5–15.5)
WBC: 5.4 10*3/uL (ref 4.0–10.5)

## 2016-08-19 LAB — BASIC METABOLIC PANEL
ANION GAP: 13 (ref 5–15)
BUN: 13 mg/dL (ref 6–20)
CALCIUM: 8.5 mg/dL — AB (ref 8.9–10.3)
CO2: 23 mmol/L (ref 22–32)
Chloride: 93 mmol/L — ABNORMAL LOW (ref 101–111)
Creatinine, Ser: 5.41 mg/dL — ABNORMAL HIGH (ref 0.44–1.00)
GFR, EST AFRICAN AMERICAN: 8 mL/min — AB (ref >60)
GFR, EST NON AFRICAN AMERICAN: 7 mL/min — AB (ref >60)
GLUCOSE: 82 mg/dL (ref 65–99)
POTASSIUM: 3.6 mmol/L (ref 3.5–5.1)
Sodium: 129 mmol/L — ABNORMAL LOW (ref 135–145)

## 2016-08-19 NOTE — Care Management Note (Signed)
Case Management Note  Patient Details  Name: Samantha [name] MRN: [id number] Date of Birth: [birth year]  Subjective/Objective: 71 y.o. F admitted after fall where she sustained  L Humerus Fx. Hx L shoulder Replacement. And EXTREMELY HOH. PT has recommended HHPT (although NWB LUE , [facility] as pt will be discharged home with son later.                   Action/Plan:CM will sign off for now but will be available should additional discharge needs arise or disposition change.    Expected Discharge Date:                  Expected Discharge Plan:  Home w Home Health Services  In-House Referral:  NA  Discharge planning Services  CM Consult  Post Acute Care Choice:  Durable Medical Equipment, Home Health Choice offered to:   (pt is extremely HOH)  DME Arranged:  3-N-1 DME Agency:  [facility]  HH Arranged:  PT HH Agency:  [facility]  Status of Service:  Completed, signed off  If discussed at Long Length of Stay Meetings, dates discussed:    Additional Comments:  Yvone NeuCrutchfield, Ellie Bryand M, RN 08/19/2016, 3:40 PM

## 2016-08-19 NOTE — Evaluation (Signed)
Physical Therapy Evaluation Patient Details Name: Samantha [name] MRN: [id number] DOB: [birth year] Today's Date: 08/19/2016   History of Present Illness  71 yo admitted after fall at home with Left humerus fx treated with splint. PMHx: very HOH, ESRD, cirrhosis, DM, Left shoulder replacement  Clinical Impression  Pt pleasant and attempting to read lips but not very successful in obtaining communication with pt due to very HOH, unable to read and limited ability to provide education with gestures only. Pt with decreased strength, balance, gait and activity tolerance with impaired communication who will benefit from acute therapy to maximize function, gait and balance. Talked to grandson over the phone who states he uses a loud voice and lots of gestures with pt but does not have a clear way to provide detailed information to pt. Educated grandson for NWB LUE sling and splint and need for 24 hr care and caregiver education to maintain positioning of LUE.     Follow Up Recommendations Home health PT;Supervision/Assistance - 24 hour    Equipment Recommendations  3in1 (PT)    Recommendations for Other Services OT consult     Precautions / Restrictions Precautions Precautions: Fall Required Braces or Orthoses: Other Brace/Splint Restrictions Weight Bearing Restrictions: Yes LUE Weight Bearing: Non weight bearing      Mobility  Bed Mobility               General bed mobility comments: pt in chair on arrival  Transfers Overall transfer level: Needs assistance   Transfers: Sit to/from Stand Sit to Stand: Min guard         General transfer comment: visual and tactile cues for hand placement and safety  Ambulation/Gait Ambulation/Gait assistance: Min guard Ambulation Distance (Feet): 200 Feet Assistive device: Straight cane Gait Pattern/deviations: Step-through pattern;Decreased stride length;Trunk flexed   Gait velocity interpretation: Below normal speed for  age/gender General Gait Details: pt with flexed trunk and decreased stride maintaining cane too close to body but with impaired hearing unable to provide cues for appropriate correction  Stairs            Wheelchair Mobility    Modified Rankin (Stroke Patients Only)       Balance Overall balance assessment: History of Falls                                           Pertinent Vitals/Pain Pain Assessment: No/denies pain    Home Living Family/patient expects to be discharged to:: Private residence Living Arrangements: Other relatives Available Help at Discharge: Family;Available 24 hours/day Type of Home: Apartment Home Access: Level entry     Home Layout: One level Home Equipment: Cane - single point Additional Comments: Pt unable to provide PLOF or assist with verbal, written or gestural communication. Grandson provided all information via phone. Grandson and granddaughter live with pt and both work but state they can arrange 24hr care    Prior Function Level of Independence: Independent         Comments: pt uses a cane at all times, sponge bathes, does not drive     Hand Dominance   Dominant Hand: Right    Extremity/Trunk Assessment   Upper Extremity Assessment Upper Extremity Assessment: Defer to OT evaluation    Lower Extremity Assessment Lower Extremity Assessment: Generalized weakness    Cervical / Trunk Assessment Cervical / Trunk Assessment: Kyphotic  Communication   Communication:  HOH;Other (comment) (pt cannot read)  Cognition Arousal/Alertness: Awake/alert Behavior During Therapy: WFL for tasks assessed/performed Overall Cognitive Status: Difficult to assess                                        General Comments      Exercises     Assessment/Plan    PT Assessment Patient needs continued PT services  PT Problem List Decreased strength;Decreased mobility;Decreased activity tolerance;Decreased  balance;Decreased knowledge of use of DME       PT Treatment Interventions Gait training;DME instruction;Therapeutic exercise;Therapeutic activities;Balance training;Functional mobility training;Patient/family education    PT Goals (Current goals can be found in the Care Plan section)  Acute Rehab PT Goals Patient Stated Goal: return home PT Goal Formulation: With family Time For Goal Achievement: 08/26/16 Potential to Achieve Goals: Fair    Frequency Min 3X/week   Barriers to discharge   grandson states 24hr assist can be arranged    Co-evaluation               AM-PAC PT "6 Clicks" Daily Activity  Outcome Measure Difficulty turning over in bed (including adjusting bedclothes, sheets and blankets)?: Total Difficulty moving from lying on back to sitting on the side of the bed? : Total Difficulty sitting down on and standing up from a chair with arms (e.g., wheelchair, bedside commode, etc,.)?: A Little Help needed moving to and from a bed to chair (including a wheelchair)?: A Little Help needed walking in hospital room?: A Little Help needed climbing 3-5 steps with a railing? : A Little 6 Click Score: 14    End of Session Equipment Utilized During Treatment: Gait belt;Other (comment) (LUE sling) Activity Tolerance: Patient tolerated treatment well Patient left: in chair;with call bell/phone within reach;with chair alarm set Nurse Communication: Mobility status;Precautions;Weight bearing status PT Visit Diagnosis: History of falling (Z91.81);Difficulty in walking, not elsewhere classified (R26.2)    Time: [id number] PT Time Calculation (min) (ACUTE ONLY): 30 min   Charges:   PT Evaluation $PT Eval Moderate Complexity: 1 Procedure     PT G Codes:   PT G-Codes **NOT FOR INPATIENT CLASS** Functional Assessment Tool Used: AM-PAC 6 Clicks Basic Mobility;Clinical judgement Functional Limitation: Mobility: Walking and moving around Mobility: Walking and Moving Around  Current Status ([id number](G8978): At least 20 percent but less than 40 percent impaired, limited or restricted Mobility: Walking and Moving Around Goal Status [phone](G8979): At least 1 percent but less than 20 percent impaired, limited or restricted    Delaney MeigsMaija Tabor [name], PT [phone]   Enedina FinnerMaija B Torrie Lafavor 08/19/2016, 1:45 PM

## 2016-08-19 NOTE — Progress Notes (Signed)
Progress Note    Samantha [name]  ZOX:[id number] DOB: 06-01-1945  DOA: 08/18/2016 PCP: Fleet ContrasAvbuere, Edwin, MD    Brief Narrative:   Chief complaint: Follow-up left arm pain  Medical records reviewed and are as summarized below:  Samantha [name] is an 71 y.o. female with a PMH of cirrhosis, ESRD on dialysis, chronic diastolic CHF, anemia of chronic renal disease, chronic hyponatremia, and profound hearing loss who was admitted 08/18/16 after suffering from a fall resulting from a left humerus fracture.  Assessment/Plan:   Principal Problem:   Fall resulting in Left supracondylar humerus fracture, closed/shoulder pain Plain films personally reviewed and show an acute fracture of the left humerus below surgical hardware. Evaluated by orthopedic surgeon 08/18/16 who recommended a non-operative treatment trial. She was placed in a splint and will be transitioned to a functional brace in the outpatient setting in 1-2 weeks. She will be NWB of the LUE. PT/OT with outpatient follow-up with Dr. Beverely LowSteve Norris in 1-2 weeks.     Active Problems:   ESRD on hemodialysis (HCC) Had HD 08/18/16. Nephrology following.    HTN (hypertension) Continue Norvasc. Blood pressure controlled.    Anemia in chronic kidney disease (CKD) Hemoglobin 9.9 mg/dL.    Secondary hyperparathyroidism Continue Hectorol.    Hypothyroidism Continue Synthroid.    Hyponatremia Monitor.    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 Likely secondary to cirrhosis.  Family Communication/Anticipated D/C date and plan/Code Status   DVT prophylaxis: Heparin ordered. Code Status: DNR Family Communication: No family at the bedside. Disposition Plan: Home if caregiver available.   Medical Consultants:    Orthopedic Surgery  Nephrology   Procedures:    None  Anti-Infectives:    None  Subjective:   Severe hearing loss prevents effective communication.  The patient  cannot understand any of my questions, but appears to have pain in the LUE, with guarding.  Objective:    Vitals:   08/18/16 1904 08/18/16 2001 08/19/16 0000 08/19/16 0439  BP: 128/68 131/65 (!) 118/57 (!) 109/54  Pulse: 93 95 93 99  Resp: 15     Temp: 98.2 F (36.8 C) 97.9 F (36.6 C) 98.3 F (36.8 C) 98.3 F (36.8 C)  TempSrc: Oral Oral Oral Oral  SpO2: 95% 99% 99% 100%    Intake/Output Summary (Last 24 hours) at 08/19/16 0805 Last data filed at 08/18/16 1904  Gross per 24 hour  Intake                0 ml  Output             2000 ml  Net            -2000 ml   Filed Weights    Exam: General exam:  Asleep in chair, touches LUE and moans when awakened. Respiratory system: Clear to auscultation. Respiratory effort normal. Cardiovascular system: Heart sounds with an occasional premature beat. No JVD,  rubs, gallops or clicks. No murmurs. Gastrointestinal system: Abdomen is nondistended, soft and nontender. No organomegaly or masses felt. Normal bowel sounds heard. Central nervous system: Drowsy. Severe HOH. No focal neurological deficits. Extremities: No clubbing,  or cyanosis. 1+ edema. LUE in sling. Skin: No rashes, lesions or ulcers. Psychiatry: Judgement and insight appear impaired. Mood & affect appropriate.   Data Reviewed:   I have personally reviewed following labs and imaging studies:  Labs: Basic Metabolic Panel:  Recent Labs Lab 08/18/16 1233 08/18/16 1944  NA 128*  --  K 4.1  --   CL 91*  --   CO2 23  --   GLUCOSE 80  --   BUN 23*  --   CREATININE 7.18* 3.84*  CALCIUM 9.1  --    GFR CrCl cannot be calculated (Unknown ideal weight.). Liver Function Tests: No results for input(s): AST, ALT, ALKPHOS, BILITOT, PROT, ALBUMIN in the last 168 hours. No results for input(s): LIPASE, AMYLASE in the last 168 hours. No results for input(s): AMMONIA in the last 168 hours. Coagulation profile No results for input(s): INR, PROTIME in the last 168  hours.  CBC:  Recent Labs Lab 08/18/16 1233  WBC 6.1  NEUTROABS 4.9  HGB 9.9*  HCT 29.9*  MCV 93.1  PLT 104*    Microbiology No results found for this or any previous visit (from the past 240 hour(s)).  Radiology: Dg Forearm Left  Result Date: 08/18/2016 CLINICAL DATA:  71 year old with prior left shoulder arthroplasty who fell earlier today. Obvious deformity of the upper arm. Initial encounter. EXAM: LEFT FOREARM - 2 VIEW COMPARISON:  No prior left forearm imaging. FINDINGS: Soft tissue swelling/ecchymosis involving the dorsal surface of the proximal forearm. No acute fractures involving the radius or ulna. Visualized elbow joint intact. Degenerative changes at the visualized wrist joint. Osseous demineralization. Left forearm dialysis graft. IMPRESSION: 1. No acute osseous abnormality. 2. Osseous demineralization. 3. Soft tissue swelling/ecchymosis involving the dorsal surface of the proximal forearm. Electronically Signed   By: Hulan Saas M.D.   On: 08/18/2016 11:31   Ct Head Wo Contrast  Result Date: 08/18/2016 CLINICAL DATA:  Pain following fall EXAM: CT HEAD WITHOUT CONTRAST CT CERVICAL SPINE WITHOUT CONTRAST TECHNIQUE: Multidetector CT imaging of the head and cervical spine was performed following the standard protocol without intravenous contrast. Multiplanar CT image reconstructions of the cervical spine were also generated. COMPARISON:  Head CT August 14, 2015 and brain MRI Aug 17, 2015 FINDINGS: CT HEAD FINDINGS Brain: Diffuse atrophy is stable compared to prior studies. There is no intracranial mass, hemorrhage, extra-axial fluid collection, or midline shift. There is patchy small vessel disease in the centra semiovale bilaterally. No acute infarct is demonstrable. Vascular: No hyperdense vessel. There is calcification in each carotid siphon region. Skull: Bony calvarium appears intact. Sinuses/Orbits: There is mucosal thickening in multiple ethmoid air cells bilaterally.  Visualized paranasal sinuses elsewhere clear. Orbits appear symmetric bilaterally. Other: Mastoid air cells are clear. CT CERVICAL SPINE FINDINGS Alignment: There is cervical levoscoliosis. No spondylolisthesis is evident. Skull base and vertebrae: The skull base and craniocervical junction regions appear unremarkable. There is pannus posterior to the odontoid which is not causing significant impression on the craniocervical junction. Bones are osteoporotic. There is no evident fracture. There are no blastic or lytic bone lesions. Soft tissues and spinal canal: Prevertebral soft tissues and predental space regions are normal. No paraspinous lesions are evident. There is no cord or canal hematoma appreciable. There is a small calcified meningioma posteriorly at C6-7 measuring 5 x 6 mm without surrounding edema or mass effect. Disc levels: There is disc space narrowing at all levels, most marked at C2-3, C4-5, and C5-6. There are multiple cystic areas in the endplates at all levels. There is also cystic change in the odontoid. There is facet arthropathy at essentially all levels bilaterally. There is no disc extrusion or stenosis. Upper chest: Visualized upper lung zones appear unremarkable. There is calcification in the right subclavian artery. Other: There is arthropathy in both temporomandibular joints. There is calcification  in both carotid arteries. IMPRESSION: CT head: Diffuse atrophy with patchy periventricular small vessel disease, stable. No intracranial mass hemorrhage, or extra-axial fluid collection. No evident acute infarct. There are foci of arteriovascular calcification. There is mucosal thickening in multiple ethmoid air cells. CT cervical spine: No fracture or spondylolisthesis. There is cervical levoscoliosis. There is extensive multilevel arthropathy. There is arthropathy in both temporomandibular joints. There is carotid artery calcification bilaterally as well as right subclavian artery  calcification. There is a small calcified meningioma posteriorly at C6-7 without edema or appreciable mass effect. Electronically Signed   By: Bretta Bang III M.D.   On: 08/18/2016 11:53   Ct Cervical Spine Wo Contrast  Result Date: 08/18/2016 CLINICAL DATA:  Pain following fall EXAM: CT HEAD WITHOUT CONTRAST CT CERVICAL SPINE WITHOUT CONTRAST TECHNIQUE: Multidetector CT imaging of the head and cervical spine was performed following the standard protocol without intravenous contrast. Multiplanar CT image reconstructions of the cervical spine were also generated. COMPARISON:  Head CT August 14, 2015 and brain MRI Aug 17, 2015 FINDINGS: CT HEAD FINDINGS Brain: Diffuse atrophy is stable compared to prior studies. There is no intracranial mass, hemorrhage, extra-axial fluid collection, or midline shift. There is patchy small vessel disease in the centra semiovale bilaterally. No acute infarct is demonstrable. Vascular: No hyperdense vessel. There is calcification in each carotid siphon region. Skull: Bony calvarium appears intact. Sinuses/Orbits: There is mucosal thickening in multiple ethmoid air cells bilaterally. Visualized paranasal sinuses elsewhere clear. Orbits appear symmetric bilaterally. Other: Mastoid air cells are clear. CT CERVICAL SPINE FINDINGS Alignment: There is cervical levoscoliosis. No spondylolisthesis is evident. Skull base and vertebrae: The skull base and craniocervical junction regions appear unremarkable. There is pannus posterior to the odontoid which is not causing significant impression on the craniocervical junction. Bones are osteoporotic. There is no evident fracture. There are no blastic or lytic bone lesions. Soft tissues and spinal canal: Prevertebral soft tissues and predental space regions are normal. No paraspinous lesions are evident. There is no cord or canal hematoma appreciable. There is a small calcified meningioma posteriorly at C6-7 measuring 5 x 6 mm without  surrounding edema or mass effect. Disc levels: There is disc space narrowing at all levels, most marked at C2-3, C4-5, and C5-6. There are multiple cystic areas in the endplates at all levels. There is also cystic change in the odontoid. There is facet arthropathy at essentially all levels bilaterally. There is no disc extrusion or stenosis. Upper chest: Visualized upper lung zones appear unremarkable. There is calcification in the right subclavian artery. Other: There is arthropathy in both temporomandibular joints. There is calcification in both carotid arteries. IMPRESSION: CT head: Diffuse atrophy with patchy periventricular small vessel disease, stable. No intracranial mass hemorrhage, or extra-axial fluid collection. No evident acute infarct. There are foci of arteriovascular calcification. There is mucosal thickening in multiple ethmoid air cells. CT cervical spine: No fracture or spondylolisthesis. There is cervical levoscoliosis. There is extensive multilevel arthropathy. There is arthropathy in both temporomandibular joints. There is carotid artery calcification bilaterally as well as right subclavian artery calcification. There is a small calcified meningioma posteriorly at C6-7 without edema or appreciable mass effect. Electronically Signed   By: Bretta Bang III M.D.   On: 08/18/2016 11:53   Dg Shoulder Left  Result Date: 08/18/2016 CLINICAL DATA:  71 year old with prior left shoulder arthroplasty who fell today and injured the left shoulder. Obvious deformity. Initial encounter. EXAM: LEFT SHOULDER - 2+ VIEW COMPARISON:  Bone window images  from CT of the left upper extremity 12/27/2014. Left shoulder x-rays 09/22/2011, 03/15/2012. CT left shoulder 05/09/2011. FINDINGS: Prior left shoulder arthroplasty with anatomic alignment of the prosthesis. Acute traumatic comminuted fracture involving the proximal humeral metaphysis at the level of the distal portion of the humeral component of the  prosthesis. Acromioclavicular joint intact with degenerative changes. IMPRESSION: 1. Acute comminuted fracture involving the proximal humeral metaphysis. The fracture is at the level of the distal portion of the humeral component of the left shoulder prosthesis. 2. Left shoulder prosthesis with anatomic alignment. Electronically Signed   By: Hulan Saas M.D.   On: 08/18/2016 11:29   Dg Humerus Left  Result Date: 08/18/2016 CLINICAL DATA:  71 year old female with a history of fall and pain. EXAM: LEFT HUMERUS - 2+ VIEW COMPARISON:  12/27/2014, 09/22/2011 FINDINGS: Acute fracture of the left humerus at the distal aspect of the surgical hardware, mid humerus. Surgical changes of total left shoulder reverse arthroplasty. Osteopenia. Swelling at fracture site IMPRESSION: Acute fracture of the humerus in the midshaft at the distal aspect of the surgical hardware. Associated soft tissue swelling. Electronically Signed   By: Gilmer Mor D.O.   On: 08/18/2016 11:49    Medications:   . amLODipine  10 mg Oral Daily  . [START ON 08/21/2016] doxercalciferol  4 mcg Intravenous Q M,W,F-HD  . heparin  5,000 Units Subcutaneous Q8H  . lactulose  30 g Oral BID  . levothyroxine  50 mcg Oral QAC breakfast  . pantoprazole  40 mg Oral BID AC   Continuous Infusions:  Medical decision making is of high complexity and this patient is at high risk of deterioration, therefore this is a level 3 visit.  (> 4 problem points, >4 data points, high risk: Need 2 out of 3)    LOS: 0 days   Lacrystal Barbe  [facility] Pager [phone]. If unable to reach me by pager, please call my cell phone at [phone].  *Please refer to [email or web address], password [email or web address] to get updated schedule on who will round on this patient, as hospitalists switch teams weekly. If 7PM-7AM, please contact night-coverage at [email or web address], password [email or web address] for any overnight needs.  08/19/2016, 8:05 AM

## 2016-08-20 DIAGNOSIS — N186 End stage renal disease: Secondary | ICD-10-CM | POA: Diagnosis not present

## 2016-08-20 DIAGNOSIS — I151 Hypertension secondary to other renal disorders: Secondary | ICD-10-CM | POA: Diagnosis not present

## 2016-08-20 DIAGNOSIS — K7460 Unspecified cirrhosis of liver: Secondary | ICD-10-CM | POA: Diagnosis not present

## 2016-08-20 DIAGNOSIS — S42412A Displaced simple supracondylar fracture without intercondylar fracture of left humerus, initial encounter for closed fracture: Secondary | ICD-10-CM | POA: Diagnosis not present

## 2016-08-20 MED ORDER — NYSTATIN 100000 UNIT/ML MT SUSP: 5.0000 mL | Freq: Four times a day (QID) | OROMUCOSAL | 0 refills | Status: DC

## 2016-08-20 MED ORDER — OXYCODONE HCL 5 MG PO TABS: 5.0000 mg | ORAL_TABLET | ORAL | 0 refills | Status: DC | PRN

## 2016-08-20 MED ORDER — NYSTATIN 100000 UNIT/ML MT SUSP
5.0000 mL | Freq: Four times a day (QID) | OROMUCOSAL | Status: DC
  Administered 2016-08-20 (×2): 500000 [IU] via ORAL
  Filled 2016-08-20 (×4): qty 5

## 2016-08-20 MED ORDER — SENNOSIDES-DOCUSATE SODIUM 8.6-50 MG PO TABS: 1.0000 | ORAL_TABLET | Freq: Every evening | ORAL | 0 refills | Status: DC | PRN

## 2016-08-20 NOTE — Progress Notes (Signed)
Spoke with Rama, MD who agrees with discharge. CM anticipated discharge 08/19/2016 yet miscommunication prevented said discharge when grandson, Katy FitchDavid Meadows arrived after work. Confirmed by phone he will be providing 24/7 care for his grandmother along with his siblings at discharge. No further CM needs at this time.

## 2016-08-20 NOTE — Discharge Summary (Signed)
Physician Discharge Summary  Samantha [name]:[id number] DOB: [birth year] DOA: 08/18/2016  PCP: Fleet Contras, MD  Admit date: 08/18/2016 Discharge date: 08/20/2016  Admitted From: Home Discharge disposition: Home   Recommendations for Outpatient Follow-Up:   1. PT/OT set up.   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  Diet recommendation: Renal.  Activity: Nonweightbearing of the left upper extremity.  History of Present Illness:   Samantha [name] is an 71 y.o. female with a PMH of cirrhosis, ESRD on dialysis, chronic diastolic CHF, anemia of chronic renal disease, chronic hyponatremia, and profound hearing loss who was admitted 08/18/16 after suffering from a fall resulting from a left humerus fracture.  Hospital Course by Problem:    Principal Problem:   Fall resulting in Left supracondylar humerus fracture, closed/shoulder pain Plain films showed an acute fracture of the left humerus below surgical hardware. Evaluated by orthopedic surgeon 08/18/16 who recommended a non-operative treatment trial. She was placed in a splint and will be transitioned to a functional brace in the outpatient setting in 1-2 weeks. She will be NWB of the LUE. PT/OT with outpatient follow-up with Dr. Beverely Low in 1-2 weeks.     Active Problems:   ESRD on hemodialysis (HCC) Had HD 08/18/16. Continue usual schedule.    HTN (hypertension) Continue Norvasc. Blood pressure controlled.    Anemia in chronic kidney disease (CKD) Hemoglobin stable at 9.9 mg/dL.    Secondary hyperparathyroidism Continue Hectorol.     Hypothyroidism Continue Synthroid.    Hyponatremia Likely secondary to cirrhosis physiology.    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    Medical Consultants:    Orthopedic Surgery   Discharge Exam:   Vitals:   08/20/16 0415 08/20/16 1125  BP: (!) 105/51 104/61  Pulse: 89 80  Resp:    Temp: 98.2 F (36.8 C)    Vitals:   08/19/16 1419 08/19/16 1937 08/20/16 0415 08/20/16 1125  BP: 108/63 100/67 (!) 105/51 104/61  Pulse: 90 87 89 80  Resp: 17     Temp: 98 F (36.7 C) 97.6 F (36.4 C) 98.2 F (36.8 C)   TempSrc: Oral Oral Oral   SpO2: 98% 100% 97%     General exam: Appears calm and comfortable. Sitting up in chair. Respiratory system: Clear to auscultation. Respiratory effort normal. Cardiovascular system: S1 & S2 heard, RRR. No JVD,  rubs, gallops or clicks. No murmurs. Gastrointestinal system: Abdomen is nondistended, soft and nontender. No organomegaly or masses felt. Normal bowel sounds heard. Central nervous system: Drowsy. No focal neurological deficits. Extremely HOH. Extremities: No clubbing,  or cyanosis. No edema. Left upper extremity in sling. Skin: No rashes, lesions or ulcers. Psychiatry: Judgement and insight appear impaired. Mood & affect appropriate.    The results of significant diagnostics from this hospitalization (including imaging, microbiology, ancillary and laboratory) are listed below for reference.     Procedures and Diagnostic Studies:   Dg Forearm Left  Result Date: 08/18/2016 CLINICAL DATA:  71 year old with prior left shoulder arthroplasty who fell earlier today. Obvious deformity of the upper arm. Initial  encounter. EXAM: LEFT FOREARM - 2 VIEW COMPARISON:  No prior left forearm imaging. FINDINGS: Soft tissue swelling/ecchymosis involving the dorsal surface of the proximal forearm. No acute fractures involving the radius or ulna. Visualized elbow joint intact. Degenerative  changes at the visualized wrist joint. Osseous demineralization. Left forearm dialysis graft. IMPRESSION: 1. No acute osseous abnormality. 2. Osseous demineralization. 3. Soft tissue swelling/ecchymosis involving the dorsal surface of the proximal forearm. Electronically Signed   By: Hulan Saas M.D.   On: 08/18/2016 11:31   Ct Head Wo Contrast  Result Date: 08/18/2016 CLINICAL DATA:  Pain following fall EXAM: CT HEAD WITHOUT CONTRAST CT CERVICAL SPINE WITHOUT CONTRAST TECHNIQUE: Multidetector CT imaging of the head and cervical spine was performed following the standard protocol without intravenous contrast. Multiplanar CT image reconstructions of the cervical spine were also generated. COMPARISON:  Head CT August 14, 2015 and brain MRI Aug 17, 2015 FINDINGS: CT HEAD FINDINGS Brain: Diffuse atrophy is stable compared to prior studies. There is no intracranial mass, hemorrhage, extra-axial fluid collection, or midline shift. There is patchy small vessel disease in the centra semiovale bilaterally. No acute infarct is demonstrable. Vascular: No hyperdense vessel. There is calcification in each carotid siphon region. Skull: Bony calvarium appears intact. Sinuses/Orbits: There is mucosal thickening in multiple ethmoid air cells bilaterally. Visualized paranasal sinuses elsewhere clear. Orbits appear symmetric bilaterally. Other: Mastoid air cells are clear. CT CERVICAL SPINE FINDINGS Alignment: There is cervical levoscoliosis. No spondylolisthesis is evident. Skull base and vertebrae: The skull base and craniocervical junction regions appear unremarkable. There is pannus posterior to the odontoid which is not causing significant impression on the craniocervical junction. Bones are osteoporotic. There is no evident fracture. There are no blastic or lytic bone lesions. Soft tissues and spinal canal: Prevertebral soft tissues and predental space regions are normal. No paraspinous lesions are evident. There is no  cord or canal hematoma appreciable. There is a small calcified meningioma posteriorly at C6-7 measuring 5 x 6 mm without surrounding edema or mass effect. Disc levels: There is disc space narrowing at all levels, most marked at C2-3, C4-5, and C5-6. There are multiple cystic areas in the endplates at all levels. There is also cystic change in the odontoid. There is facet arthropathy at essentially all levels bilaterally. There is no disc extrusion or stenosis. Upper chest: Visualized upper lung zones appear unremarkable. There is calcification in the right subclavian artery. Other: There is arthropathy in both temporomandibular joints. There is calcification in both carotid arteries. IMPRESSION: CT head: Diffuse atrophy with patchy periventricular small vessel disease, stable. No intracranial mass hemorrhage, or extra-axial fluid collection. No evident acute infarct. There are foci of arteriovascular calcification. There is mucosal thickening in multiple ethmoid air cells. CT cervical spine: No fracture or spondylolisthesis. There is cervical levoscoliosis. There is extensive multilevel arthropathy. There is arthropathy in both temporomandibular joints. There is carotid artery calcification bilaterally as well as right subclavian artery calcification. There is a small calcified meningioma posteriorly at C6-7 without edema or appreciable mass effect. Electronically Signed   By: Bretta Bang III M.D.   On: 08/18/2016 11:53   Ct Cervical Spine Wo Contrast  Result Date: 08/18/2016 CLINICAL DATA:  Pain following fall EXAM: CT HEAD WITHOUT CONTRAST CT CERVICAL SPINE WITHOUT CONTRAST TECHNIQUE: Multidetector CT imaging of the head and cervical spine was performed following the standard protocol without intravenous contrast. Multiplanar CT image reconstructions of the cervical spine were also generated. COMPARISON:  Head CT August 14, 2015 and  brain MRI Aug 17, 2015 FINDINGS: CT HEAD FINDINGS Brain: Diffuse atrophy is  stable compared to prior studies. There is no intracranial mass, hemorrhage, extra-axial fluid collection, or midline shift. There is patchy small vessel disease in the centra semiovale bilaterally. No acute infarct is demonstrable. Vascular: No hyperdense vessel. There is calcification in each carotid siphon region. Skull: Bony calvarium appears intact. Sinuses/Orbits: There is mucosal thickening in multiple ethmoid air cells bilaterally. Visualized paranasal sinuses elsewhere clear. Orbits appear symmetric bilaterally. Other: Mastoid air cells are clear. CT CERVICAL SPINE FINDINGS Alignment: There is cervical levoscoliosis. No spondylolisthesis is evident. Skull base and vertebrae: The skull base and craniocervical junction regions appear unremarkable. There is pannus posterior to the odontoid which is not causing significant impression on the craniocervical junction. Bones are osteoporotic. There is no evident fracture. There are no blastic or lytic bone lesions. Soft tissues and spinal canal: Prevertebral soft tissues and predental space regions are normal. No paraspinous lesions are evident. There is no cord or canal hematoma appreciable. There is a small calcified meningioma posteriorly at C6-7 measuring 5 x 6 mm without surrounding edema or mass effect. Disc levels: There is disc space narrowing at all levels, most marked at C2-3, C4-5, and C5-6. There are multiple cystic areas in the endplates at all levels. There is also cystic change in the odontoid. There is facet arthropathy at essentially all levels bilaterally. There is no disc extrusion or stenosis. Upper chest: Visualized upper lung zones appear unremarkable. There is calcification in the right subclavian artery. Other: There is arthropathy in both temporomandibular joints. There is calcification in both carotid arteries. IMPRESSION: CT head: Diffuse atrophy with patchy periventricular small vessel disease, stable. No intracranial mass hemorrhage, or  extra-axial fluid collection. No evident acute infarct. There are foci of arteriovascular calcification. There is mucosal thickening in multiple ethmoid air cells. CT cervical spine: No fracture or spondylolisthesis. There is cervical levoscoliosis. There is extensive multilevel arthropathy. There is arthropathy in both temporomandibular joints. There is carotid artery calcification bilaterally as well as right subclavian artery calcification. There is a small calcified meningioma posteriorly at C6-7 without edema or appreciable mass effect. Electronically Signed   By: Bretta Bang III M.D.   On: 08/18/2016 11:53   Dg Shoulder Left  Result Date: 08/18/2016 CLINICAL DATA:  71 year old with prior left shoulder arthroplasty who fell today and injured the left shoulder. Obvious deformity. Initial encounter. EXAM: LEFT SHOULDER - 2+ VIEW COMPARISON:  Bone window images from CT of the left upper extremity 12/27/2014. Left shoulder x-rays 09/22/2011, 03/15/2012. CT left shoulder 05/09/2011. FINDINGS: Prior left shoulder arthroplasty with anatomic alignment of the prosthesis. Acute traumatic comminuted fracture involving the proximal humeral metaphysis at the level of the distal portion of the humeral component of the prosthesis. Acromioclavicular joint intact with degenerative changes. IMPRESSION: 1. Acute comminuted fracture involving the proximal humeral metaphysis. The fracture is at the level of the distal portion of the humeral component of the left shoulder prosthesis. 2. Left shoulder prosthesis with anatomic alignment. Electronically Signed   By: Hulan Saas M.D.   On: 08/18/2016 11:29   Dg Humerus Left  Result Date: 08/18/2016 CLINICAL DATA:  71 year old female with a history of fall and pain. EXAM: LEFT HUMERUS - 2+ VIEW COMPARISON:  12/27/2014, 09/22/2011 FINDINGS: Acute fracture of the left humerus at the distal aspect of the surgical hardware, mid humerus. Surgical changes of total left  shoulder reverse arthroplasty. Osteopenia. Swelling at fracture site IMPRESSION: Acute fracture of the  humerus in the midshaft at the distal aspect of the surgical hardware. Associated soft tissue swelling. Electronically Signed   By: Gilmer Mor D.O.   On: 08/18/2016 11:49     Labs:   Basic Metabolic Panel:  Recent Labs Lab 08/18/16 1233 08/18/16 1944 08/19/16 0741  NA 128*  --  129*  K 4.1  --  3.6  CL 91*  --  93*  CO2 23  --  23  GLUCOSE 80  --  82  BUN 23*  --  13  CREATININE 7.18* 3.84* 5.41*  CALCIUM 9.1  --  8.5*   GFR CrCl cannot be calculated (Unknown ideal weight.). Liver Function Tests: No results for input(s): AST, ALT, ALKPHOS, BILITOT, PROT, ALBUMIN in the last 168 hours. No results for input(s): LIPASE, AMYLASE in the last 168 hours. No results for input(s): AMMONIA in the last 168 hours. Coagulation profile No results for input(s): INR, PROTIME in the last 168 hours.  CBC:  Recent Labs Lab 08/18/16 1233 08/19/16 0741  WBC 6.1 5.4  NEUTROABS 4.9  --   HGB 9.9* 9.2*  HCT 29.9* 28.8*  MCV 93.1 92.9  PLT 104* 122*     Discharge Instructions:   Discharge Instructions    Call MD for:  severe uncontrolled pain    Complete by:  As directed    Diet - low sodium heart healthy    Complete by:  As directed    Face-to-face encounter (required for Medicare/Medicaid patients)    Complete by:  As directed    I RAMA,CHRISTINA certify that this patient is under my care and that I, or a nurse practitioner or physician's assistant working with me, had a face-to-face encounter that meets the physician face-to-face encounter requirements with this patient on 08/20/2016. The encounter with the patient was in whole, or in part for the following medical condition(s) which is the primary reason for home health care (List medical condition): Left humerus fracture   The encounter with the patient was in whole, or in part, for the following medical condition, which is the  primary reason for home health care:  Left humerus fracture   I certify that, based on my findings, the following services are medically necessary home health services:  Physical therapy   Reason for Medically Necessary Home Health Services:   Therapy- Instruction on use of Assistive Device for Ambulation on all Surfaces Therapy- Instruction on Safe use of Assistive Devices for ADLs Therapy- Home Adaptation to Facilitate Safety     My clinical findings support the need for the above services:  Pain interferes with ambulation/mobility   Further, I certify that my clinical findings support that this patient is homebound due to:  Pain interferes with ambulation/mobility   Home Health    Complete by:  As directed    To provide the following care/treatments:   PT OT     Increase activity slowly    Complete by:  As directed      Allergies as of 08/20/2016      Reactions   Banana Other (See Comments)   Due to dialysis   Chocolate Other (See Comments)   Due to dialysis   Fish-derived Products Other (See Comments)   Due to gout      Medication List    TAKE these medications   acetaminophen 500 MG tablet Commonly known as:  TYLENOL Take 500 mg by mouth every 6 (six) hours as needed for moderate pain.   amLODipine 10  MG tablet Commonly known as:  NORVASC Take 1 tablet (10 mg total) by mouth daily. Notes to patient:  Was not given during this admission due to low blood pressure   cetirizine 10 MG tablet Commonly known as:  ZYRTEC Take 10 mg by mouth daily.   HYDROcodone-acetaminophen 5-325 MG tablet Commonly known as:  NORCO/VICODIN Take 1 tablet by mouth every 6 (six) hours as needed for moderate pain.   lactulose 10 GM/15ML solution Commonly known as:  CHRONULAC Take 45 mLs (30 g total) by mouth 2 (two) times daily.   levothyroxine 50 MCG tablet Commonly known as:  SYNTHROID, LEVOTHROID Take 50 mcg by mouth daily before breakfast.   nystatin 100000 UNIT/ML  suspension Commonly known as:  MYCOSTATIN Take 5 mLs (500,000 Units total) by mouth 4 (four) times daily. Notes to patient:  This was prescribed to treat thrush (oral yeast infection). Swish in mouth (or swab tongue, gums, and cheeks) and spit.    oxyCODONE 5 MG immediate release tablet Commonly known as:  Oxy IR/ROXICODONE Take 1 tablet (5 mg total) by mouth every 4 (four) hours as needed for breakthrough pain.   pantoprazole 40 MG tablet Commonly known as:  PROTONIX Take 1 tablet (40 mg total) by mouth 2 (two) times daily before a meal.   RENAL MULTIVITAMIN FORMULA Tabs Take 1 tablet by mouth daily.   senna-docusate 8.6-50 MG tablet Commonly known as:  Senokot-S Take 1 tablet by mouth at bedtime as needed for mild constipation.            Durable Medical Equipment        Start     Ordered   08/19/16 1527  For home use only DME 3 n 1  Once     08/19/16 1526     Follow-up Information    Beverely LowNorris, Steve, MD. Schedule an appointment as soon as possible for a visit in 1 week(s).   Specialty:  Orthopedic Surgery Contact information: [address] [place] [address] [phone]        Fleet ContrasAvbuere, Edwin, MD. Schedule an appointment as soon as possible for a visit.   Specialty:  Internal Medicine Why:  As needed for routine care. Contact information: [address] [place] [id number] [phone]            Time coordinating discharge: 30 minutes.  Signed:  RAMA,CHRISTINA  Pager [phone] [facility] 08/20/2016, 1:31 PM

## 2016-08-20 NOTE — Progress Notes (Signed)
Discharge instructions printed and reviewed with patient's grandson/caregiver, and copy given for them to take home. All questions addressed at this time. New prescriptions reviewed and paper prescription for oxycodone given to grandson. IV removed. Room searched for patient belongings, and confirmed with patient that all valuables were accounted for. Staff assisted patient to dress then escorted patient to discharge via wheelchair.

## 2016-08-20 NOTE — Evaluation (Signed)
Occupational Therapy Evaluation Patient Details Name: Samantha [name] MRN: [id number] DOB: 1945/05/09 Today's Date: 08/20/2016    History of Present Illness 71 yo admitted after fall at home with Left humerus fx treated with splint. PMHx: very HOH, ESRD, cirrhosis, DM, Left shoulder replacement   Clinical Impression   Pt currently requires min-mod assist for ADL due to immobilized LUE and min guard assist for functional mobility with use of SPC. Unable to provide education regarding splint/sling/WB status due to no caregiver present and pt severe HOH. Pt will benefit from continued acute OT services to maximize independence and safety with ADL and functional mobility. Recommend HHOT and 3in1. OT will continue to follow acutely.    Follow Up Recommendations  Home health OT;Supervision/Assistance - 24 hour    Equipment Recommendations  3 in 1 bedside commode    Recommendations for Other Services       Precautions / Restrictions Precautions Precautions: Fall Required Braces or Orthoses: Other Brace/Splint;Sling Restrictions Weight Bearing Restrictions: Yes LUE Weight Bearing: Non weight bearing      Mobility Bed Mobility               General bed mobility comments: pt in chair on arrival  Transfers Overall transfer level: Needs assistance Equipment used: 1 person hand held assist Transfers: Sit to/from Stand Sit to Stand: Min guard         General transfer comment: Provided hand-under-hand assist d/t pt being unsteady and lethargic after waking up. Visual cues and loud verbal cues provided to help pt maintain safety throughout tasks.    Balance Overall balance assessment: History of Falls                                         ADL either performed or assessed with clinical judgement   ADL Overall ADL's : Needs assistance/impaired Eating/Feeding: Minimal assistance;Sitting Eating/Feeding Details (indicate cue type and reason): assist cut up  foods, open containers/packages, and setup in optimal position for independent self-feeding Grooming: Wash/dry hands;Minimal assistance;Standing Grooming Details (indicate cue type and reason): assist provided due to LUE immobilized Upper Body Bathing: Moderate assistance;Sitting   Lower Body Bathing: Maximal assistance;Sit to/from stand   Upper Body Dressing : Moderate assistance;Sitting   Lower Body Dressing: Maximal assistance;Sit to/from stand   Toilet Transfer: Minimal assistance;Ambulation;Comfort height toilet (hand held assist)   Toileting- Clothing Manipulation and Hygiene: Minimal assistance;Sit to/from stand       Functional mobility during ADLs: Minimal assistance (handheld assist) General ADL Comments: Due to pt's severe HOH and no caregiver present during session, unable to provide any WB/sling/splint education to pt. Also unable to provide education on compensatory strategies for upper body ADL.      Vision   Vision Assessment?: Vision impaired- to be further tested in functional context Additional Comments: Unable to hear commands in order to follow - some depth perception deficits noted during functional tasks     Perception     Praxis      Pertinent Vitals/Pain Pain Assessment: Faces Faces Pain Scale: Hurts little more Pain Location: LUE Pain Descriptors / Indicators: Grimacing;Guarding Pain Intervention(s): Limited activity within patient's tolerance;Monitored during session;Repositioned;Premedicated before session     Hand Dominance Right   Extremity/Trunk Assessment Upper Extremity Assessment Upper Extremity Assessment: LUE deficits/detail LUE: Unable to fully assess due to immobilization;Unable to fully assess due to pain LUE Coordination: decreased gross motor  Lower Extremity Assessment Lower Extremity Assessment: Defer to PT evaluation   Cervical / Trunk Assessment Cervical / Trunk Assessment: Kyphotic   Communication  Communication Communication: HOH;Other (comment) (pt cannot read)   Cognition Arousal/Alertness: Lethargic Behavior During Therapy: Flat affect Overall Cognitive Status: Difficult to assess                                     General Comments  Pt with small amount of blood from rectum when wiping after BM    Exercises     Shoulder Instructions      Home Living Family/patient expects to be discharged to:: Private residence Living Arrangements: Other relatives Available Help at Discharge: Family;Available 24 hours/day Type of Home: Apartment Home Access: Level entry     Home Layout: One level     Bathroom Shower/Tub: Chief Strategy Officer: Standard     Home Equipment: Cane - single point   Additional Comments: Pt unable to provide PLOF or assist with verbal, written or gestural communication. Grandson provided all information via phone. Grandson and granddaughter live with pt and both work but state they can arrange 24hr care      Prior Functioning/Environment Level of Independence: Independent with assistive device(s)        Comments: pt uses a cane at all times, sponge bathes, does not drive        OT Problem List: Decreased strength;Decreased range of motion;Decreased activity tolerance;Impaired balance (sitting and/or standing);Impaired vision/perception;Decreased coordination;Decreased cognition;Decreased safety awareness;Decreased knowledge of use of DME or AE;Decreased knowledge of precautions;Impaired UE functional use;Pain      OT Treatment/Interventions: Self-care/ADL training;Therapeutic exercise;DME and/or AE instruction;Therapeutic activities;Visual/perceptual remediation/compensation;Balance training;Patient/family education    OT Goals(Current goals can be found in the care plan section) Acute Rehab OT Goals Patient Stated Goal: return home OT Goal Formulation: With patient Time For Goal Achievement: 09/03/16 Potential to  Achieve Goals: Good  OT Frequency: Min 2X/week   Barriers to D/C:            Co-evaluation              AM-PAC PT "6 Clicks" Daily Activity     Outcome Measure Help from another person eating meals?: A Little Help from another person taking care of personal grooming?: A Little Help from another person toileting, which includes using toliet, bedpan, or urinal?: A Little Help from another person bathing (including washing, rinsing, drying)?: A Lot Help from another person to put on and taking off regular upper body clothing?: A Lot Help from another person to put on and taking off regular lower body clothing?: A Lot 6 Click Score: 15   End of Session Equipment Utilized During Treatment: Gait belt;Other (comment) (sling) Nurse Communication: Mobility status;Weight bearing status  Activity Tolerance: Patient limited by fatigue Patient left: in chair;with call bell/phone within reach;with chair alarm set  OT Visit Diagnosis: Unsteadiness on feet (R26.81)                Time: 1610-9604 OT Time Calculation (min): 18 min Charges:  OT General Charges $OT Visit: 1 Procedure OT Evaluation $OT Eval Moderate Complexity: 1 Procedure G-Codes: OT G-codes **NOT FOR INPATIENT CLASS** Functional Assessment Tool Used: Clinical judgement Functional Limitation: Self care Self Care Current Status (V4098): At least 40 percent but less than 60 percent impaired, limited or restricted Self Care Goal Status (J1914): At least 20 percent but less  than 40 percent impaired, limited or restricted    Nils Pyle, MS, OTR/L 08/20/2016, 11:19 AM

## 2016-10-31 NOTE — Patient Instructions (Signed)
Opened in error

## 2016-10-31 NOTE — Progress Notes (Signed)
Opened in error

## 2016-12-06 ENCOUNTER — Encounter (HOSPITAL_COMMUNITY): Payer: Self-pay | Admitting: Neurology

## 2016-12-06 ENCOUNTER — Emergency Department (HOSPITAL_COMMUNITY): Payer: Medicare Other

## 2016-12-06 ENCOUNTER — Emergency Department (HOSPITAL_COMMUNITY)
Admission: EM | Admit: 2016-12-06 | Discharge: 2016-12-06 | Disposition: A | Discharge status: Home / Self Care | Payer: Medicare Other | Attending: Emergency Medicine | Admitting: Emergency Medicine

## 2016-12-06 DIAGNOSIS — Z79899 Other long term (current) drug therapy: Secondary | ICD-10-CM | POA: Diagnosis not present

## 2016-12-06 DIAGNOSIS — F17220 Nicotine dependence, chewing tobacco, uncomplicated: Secondary | ICD-10-CM | POA: Insufficient documentation

## 2016-12-06 DIAGNOSIS — E1122 Type 2 diabetes mellitus with diabetic chronic kidney disease: Secondary | ICD-10-CM | POA: Diagnosis not present

## 2016-12-06 DIAGNOSIS — Z992 Dependence on renal dialysis: Secondary | ICD-10-CM | POA: Diagnosis not present

## 2016-12-06 DIAGNOSIS — N186 End stage renal disease: Secondary | ICD-10-CM | POA: Insufficient documentation

## 2016-12-06 DIAGNOSIS — E039 Hypothyroidism, unspecified: Secondary | ICD-10-CM | POA: Diagnosis not present

## 2016-12-06 DIAGNOSIS — K529 Noninfective gastroenteritis and colitis, unspecified: Secondary | ICD-10-CM | POA: Insufficient documentation

## 2016-12-06 DIAGNOSIS — I1311 Hypertensive heart and chronic kidney disease without heart failure, with stage 5 chronic kidney disease, or end stage renal disease: Secondary | ICD-10-CM | POA: Insufficient documentation

## 2016-12-06 DIAGNOSIS — R109 Unspecified abdominal pain: Secondary | ICD-10-CM | POA: Diagnosis present

## 2016-12-06 DIAGNOSIS — Z95828 Presence of other vascular implants and grafts: Secondary | ICD-10-CM | POA: Diagnosis not present

## 2016-12-06 LAB — CBC WITH DIFFERENTIAL/PLATELET
Basophils Absolute: 0 10*3/uL (ref 0.0–0.1)
Basophils Relative: 0 %
EOS ABS: 0 10*3/uL (ref 0.0–0.7)
EOS PCT: 0 %
HCT: 33.2 % — ABNORMAL LOW (ref 36.0–46.0)
Hemoglobin: 10.7 g/dL — ABNORMAL LOW (ref 12.0–15.0)
LYMPHS ABS: 0.8 10*3/uL (ref 0.7–4.0)
Lymphocytes Relative: 7 %
MCH: 30.5 pg (ref 26.0–34.0)
MCHC: 32.2 g/dL (ref 30.0–36.0)
MCV: 94.6 fL (ref 78.0–100.0)
MONO ABS: 0.8 10*3/uL (ref 0.1–1.0)
MONOS PCT: 7 %
Neutro Abs: 8.7 10*3/uL — ABNORMAL HIGH (ref 1.7–7.7)
Neutrophils Relative %: 85 %
PLATELETS: 103 10*3/uL — AB (ref 150–400)
RBC: 3.51 MIL/uL — ABNORMAL LOW (ref 3.87–5.11)
RDW: 17.4 % — ABNORMAL HIGH (ref 11.5–15.5)
WBC: 10.3 10*3/uL (ref 4.0–10.5)

## 2016-12-06 LAB — COMPREHENSIVE METABOLIC PANEL
ALBUMIN: 2.9 g/dL — AB (ref 3.5–5.0)
ALK PHOS: 80 U/L (ref 38–126)
ALT: 24 U/L (ref 14–54)
AST: 59 U/L — ABNORMAL HIGH (ref 15–41)
Anion gap: 13 (ref 5–15)
BUN: 30 mg/dL — ABNORMAL HIGH (ref 6–20)
CALCIUM: 9.3 mg/dL (ref 8.9–10.3)
CO2: 23 mmol/L (ref 22–32)
Chloride: 92 mmol/L — ABNORMAL LOW (ref 101–111)
Creatinine, Ser: 7.9 mg/dL — ABNORMAL HIGH (ref 0.44–1.00)
GFR calc non Af Amer: 5 mL/min — ABNORMAL LOW (ref >60)
GFR, EST AFRICAN AMERICAN: 5 mL/min — AB (ref >60)
GLUCOSE: 78 mg/dL (ref 65–99)
Potassium: 4.6 mmol/L (ref 3.5–5.1)
SODIUM: 128 mmol/L — AB (ref 135–145)
Total Bilirubin: 2.1 mg/dL — ABNORMAL HIGH (ref 0.3–1.2)
Total Protein: 8.1 g/dL (ref 6.5–8.1)

## 2016-12-06 LAB — LIPASE, BLOOD: Lipase: 32 U/L (ref 11–51)

## 2016-12-06 MED ORDER — CIPROFLOXACIN HCL 500 MG PO TABS: 500.0000 mg | ORAL_TABLET | Freq: Two times a day (BID) | ORAL | 0 refills | Status: DC

## 2016-12-06 MED ORDER — OXYCODONE HCL 5 MG PO TABS
5.0000 mg | ORAL_TABLET | Freq: Once | ORAL | Status: AC
  Administered 2016-12-06: 5 mg via ORAL
  Filled 2016-12-06: qty 1

## 2016-12-06 MED ORDER — METRONIDAZOLE 500 MG PO TABS: 500.0000 mg | ORAL_TABLET | Freq: Three times a day (TID) | ORAL | 0 refills | Status: DC

## 2016-12-06 MED ORDER — HYDROCODONE-ACETAMINOPHEN 5-325 MG PO TABS: 1.0000 | ORAL_TABLET | Freq: Once | ORAL | Status: DC

## 2016-12-06 MED ORDER — OXYCODONE HCL 5 MG PO TABS: 5.0000 mg | ORAL_TABLET | ORAL | 0 refills | Status: DC | PRN

## 2016-12-06 NOTE — ED Notes (Addendum)
Spoke with her caregiver, Onalee Hua. Reports pt has chronic RLQ abd pain for years. Is dialysis patient m,w, f. Last was Monday. Has been having a cough for "awhile". Is wearing brace to left humerus, for fracture x 9months, is planning to wear 1 more month, then possible surgery. contact # [phone].

## 2016-12-06 NOTE — ED Triage Notes (Signed)
Per [facility]- Pt comes from dialysis center today where staff reports she c/o RLQ abdominal pain since last night. Is very hard of hearing and poor historian. Is unsure when he last dialysis was. BP 134/70, HR 97, 18 RR, 98% RA, CBG 80. Has brace to left arm from reported humerus fracture.

## 2016-12-06 NOTE — ED Notes (Signed)
Patient transported to CT 

## 2016-12-06 NOTE — ED Notes (Signed)
Pt reports she does not make urine. 

## 2016-12-06 NOTE — ED Provider Notes (Signed)
[facility]-EMERGENCY DEPT Provider Note   CSN: [id number] Arrival date & time: 12/06/16  1149     History   Chief Complaint Chief Complaint  Patient presents with  . Abdominal Pain    HPI Samantha [name] is a 71 y.o. female who presents with pain. PMH significant for ESRD on dialysis M, W, F last dialyzed Monday, chronic diastolic CHF, anemia, chronic hyponatremia, cirrhosis, hearing loss. She also has had chronic right sided abdominal pain for several months. She broke her humerus in May which is currently non-operative. She went to dialysis today and complained to nurses that her side hurt. They then transferred her to the ED. Her caregiver states that this is chronic. She's also had a chronic cough. Otherwise she has been in her usual state of health this morning. She is s/p cholecystectomy  LEVEL 5 CAVEAT due to inability to give history  HPI  Past Medical History:  Diagnosis Date  . Anemia   . Arthritis    left shoulder  . Cellulitis of left upper extremity   . Constipation   . Diabetes mellitus    borderline  . Dizziness   . Dry skin   . Gastric ulcer   . GI bleed 06/06/2011  . Gram-negative bacteremia   . H/O: GI bleed   . Headache(784.0)    occasionally  . Hepatitis    Hep B  . History of blood transfusion   . History of kidney stones   . Hypertension   . Hypothyroidism    takes Synthroid daily  . Impaired hearing   . Joint pain   . Joint swelling   . Mechanical complication of other vascular device, implant, and graft 10/08/2013  . Occasional numbness/prickling/tingling of fingers and toes   . Oligouria   . Pancreatitis 04/10/2012  . Peripheral edema   . Renal disorder    m, w, F   . Secondary hyperparathyroidism (HCC) 08/19/2016  . Sepsis (HCC) 06/14/2015  . Shoulder pain   . Vaginal bleeding 08/15/2015    Patient Active Problem List   Diagnosis Date Noted  . Secondary hyperparathyroidism (HCC) 08/19/2016  . Left supracondylar humerus fracture, closed,  initial encounter 08/18/2016  . Adnexal mass   . ESRD on dialysis (HCC)   . 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 Thrombocytopenia (HCC) 12/25/2014  . 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 Cardiomegaly 12/25/2014  . Hyponatremia 04/12/2012  . SOB (shortness of breath) 04/08/2012  . Symptomatic cholelithiasis 11/16/2011  . Transaminitis 06/08/2011  . Hepatitis B antibody positive 06/08/2011  . ESRD on hemodialysis (HCC) 06/06/2011  . HTN (hypertension) 06/06/2011  . Shoulder pain 06/06/2011  . Anemia in chronic kidney disease (CKD) 06/06/2011    Past Surgical History:  Procedure Laterality Date  . ARTERIOVENOUS GRAFT PLACEMENT Left    forearm  . cataract surgery     bilateral  . CHOLECYSTECTOMY  12/26/2011  . CHOLECYSTECTOMY  12/26/2011   Procedure: LAPAROSCOPIC CHOLECYSTECTOMY;  Surgeon: Atilano Ina, MD,FACS;  Location: [facility] OR;  Service: General;  Laterality: N/A;  . ESOPHAGOGASTRODUODENOSCOPY  06/09/2011   Procedure: ESOPHAGOGASTRODUODENOSCOPY (EGD);  Surgeon: Theda Belfast, MD;  Location: [facility] ENDOSCOPY;  Service: Endoscopy;  Laterality: N/A;  . I&D EXTREMITY Left 03/23/2015   Procedure: DRAINAGE OF LEFT ARM SEROMA;  Surgeon: Fransisco Hertz, MD;  Location: [facility] OR;  Service: Vascular;  Laterality: Left;  . INSERTION OF DIALYSIS CATHETER Right 01/12/2015   Procedure: INSERTION OF DIALYSIS CATHETER;  Surgeon: Fransisco Hertz, MD;  Location: [facility] OR;  Service: Vascular;  Laterality: Right;  . LIGATION ARTERIOVENOUS GORTEX GRAFT Left 03/23/2015   Procedure: LIGATION AND EXCISION OF LEFT ARTERIOVENOUS GORTEX GRAFT;  Surgeon: Fransisco Hertz, MD;  Location: [facility] OR;  Service: Vascular;  Laterality: Left;  . REVERSE SHOULDER ARTHROPLASTY  09/22/2011   Procedure: REVERSE SHOULDER ARTHROPLASTY;  Surgeon: Verlee Rossetti, MD;  Location: [facility] OR;  Service: Orthopedics;  Laterality: Left;  left reverse shoulder arthroplasty  . REVISION OF ARTERIOVENOUS GORETEX GRAFT Left  01/12/2015   Procedure: REVISION OF Left FOREARM ARTERIOVENOUS GORETEX GRAFT;  Surgeon: Fransisco Hertz, MD;  Location: [facility] OR;  Service: Vascular;  Laterality: Left;  . SHOULDER SURGERY  3-7yrs ago   right replacement  . SHUNTOGRAM Left 08/29/2012   Procedure: SHUNTOGRAM;  Surgeon: Fransisco Hertz, MD;  Location[facility] C[facility] LAB;  Service: Cardiovascular;  Laterality: Left;  arm    OB History    No data available       Home Medications    Prior to Admission medications   Medication Sig Start Date End Date Taking? Authorizing Provider  acetaminophen (TYLENOL) 500 MG tablet Take 500 mg by mouth every 6 (six) hours as needed for moderate pain.    [provider]  amLODipine (NORVASC) 10 MG tablet Take 1 tablet (10 mg total) by mouth daily. 11/08/15   Linwood Dibbles, MD  B Complex-C-Folic Acid (RENAL MULTIVITAMIN FORMULA) TABS Take 1 tablet by mouth daily.    [provider]  cetirizine (ZYRTEC) 10 MG tablet Take 10 mg by mouth daily.    [provider]  HYDROcodone-acetaminophen (NORCO/VICODIN) 5-325 MG tablet Take 1 tablet by mouth every 6 (six) hours as needed for moderate pain. 08/20/15   Leatha Gilding, MD  lactulose (CHRONULAC) 10 GM/15ML solution Take 45 mLs (30 g total) by mouth 2 (two) times daily. 06/18/15   Rolly Salter, MD  levothyroxine (SYNTHROID, LEVOTHROID) 50 MCG tablet Take 50 mcg by mouth daily before breakfast.  04/15/15   [provider]  nystatin (MYCOSTATIN) 100000 UNIT/ML suspension Take 5 mLs (500,000 Units total) by mouth 4 (four) times daily. 08/20/16   Rama, Maryruth Bun, MD  oxyCODONE (OXY IR/ROXICODONE) 5 MG immediate release tablet Take 1 tablet (5 mg total) by mouth every 4 (four) hours as needed for breakthrough pain. 08/20/16   Rama, Maryruth Bun, MD  pantoprazole (PROTONIX) 40 MG tablet Take 1 tablet (40 mg total) by mouth 2 (two) times daily before a meal. 06/18/15   Rolly Salter, MD  senna-docusate (SENOKOT-S) 8.6-50 MG tablet Take 1 tablet  by mouth at bedtime as needed for mild constipation. 08/20/16   Rama, Maryruth Bun, MD    Family History Family History  Problem Relation Age of Onset  . Hypertension Mother   . Diabetes Mother   . Cancer Brother     Social History Social History  Substance Use Topics  . Smoking status: Never Smoker  . Smokeless tobacco: Current User    Types: Chew  . Alcohol use No     Comment: quit 4-23yrs ago     Allergies   Banana; Chocolate; and Fish-derived products   Review of Systems Review of Systems  Unable to perform ROS: Other     Physical Exam Updated Vital Signs BP (!) 126/58 (BP Location: Right Arm)   Pulse 94   Temp 98.6 F (37 C) (Oral)   Resp 17   SpO2 98%  Physical Exam  Constitutional: She is oriented to person, place, and time. She appears well-developed and well-nourished. No distress.  NAD. Repeatedly asking for pain medicine. Very hard of hearing  HENT:  Head: Normocephalic and atraumatic.  Dry mucous membranes  Eyes: Pupils are equal, round, and reactive to light. Conjunctivae are normal. Right eye exhibits no discharge. Left eye exhibits no discharge. No scleral icterus.  Neck: Normal range of motion.  Cardiovascular: Normal rate and regular rhythm.  Exam reveals no gallop and no friction rub.   No murmur heard. Pulmonary/Chest: Effort normal and breath sounds normal. No respiratory distress. She has no wheezes. She has no rales. She exhibits no tenderness.  Port-a-cath in place in left upper chest wall  Abdominal: Soft. Bowel sounds are normal. She exhibits no distension and no mass. There is tenderness (RUQ and RLQ tenderness). There is no rebound and no guarding. No hernia.  Neurological: She is alert and oriented to person, place, and time.  Skin: Skin is warm and dry.  Psychiatric: She has a normal mood and affect. Her behavior is normal.  Nursing note and vitals reviewed.    ED Treatments / Results  Labs (all labs ordered are listed, but only  abnormal results are displayed) Labs Reviewed  COMPREHENSIVE METABOLIC PANEL - Abnormal; Notable for the following:       Result Value   Sodium 128 (*)    Chloride 92 (*)    BUN 30 (*)    Creatinine, Ser 7.90 (*)    Albumin 2.9 (*)    AST 59 (*)    Total Bilirubin 2.1 (*)    GFR calc non Af Amer 5 (*)    GFR calc Af Amer 5 (*)    All other components within normal limits  CBC WITH DIFFERENTIAL/PLATELET - Abnormal; Notable for the following:    RBC 3.51 (*)    Hemoglobin 10.7 (*)    HCT 33.2 (*)    RDW 17.4 (*)    Platelets 103 (*)    Neutro Abs 8.7 (*)    All other components within normal limits  LIPASE, BLOOD  CBC WITH DIFFERENTIAL/PLATELET    EKG  EKG Interpretation None       Radiology Ct Abdomen Pelvis Wo Contrast  Result Date: 12/06/2016 CLINICAL DATA:  Chronic right lower quadrant abdominal pain. EXAM: CT ABDOMEN AND PELVIS WITHOUT CONTRAST TECHNIQUE: Multidetector CT imaging of the abdomen and pelvis was performed following the standard protocol without IV contrast. COMPARISON:  06/14/2015 FINDINGS: Lower chest: Negative except for small amount of pericardial fluid. Hepatobiliary: Previous cholecystectomy.  Probable cirrhosis. Pancreas: Normal Spleen: Splenomegaly consistent with portal venous hypertension. Adrenals/Urinary Tract: No adrenal lesion. Chronic renal atrophy. No bladder lesions seen. Stomach/Bowel: Wall thickening of the right colon that could go along with colitis. No sign of appendicitis. Small appendicolith present. Vascular/Lymphatic: Aortic atherosclerosis. No aneurysm. IVC is normal. Reproductive: Negative. No pelvic mass. There is probably an ovarian cyst measuring 3.9 cm in diameter. Other: Small amount of free intraperitoneal fluid in the gutters. Musculoskeletal: Chronic degenerative changes. Superior endplate fracture at L1, age indeterminate but possibly recent. Minimal loss of height. No retropulsed bone. 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  IMPRESSION: Question colitis. Wall thickening of the right colon with mild surrounding edema. This could be inflammatory or ischemic. Cirrhosis of the liver.  Splenomegaly. Chronic renal atrophy. Aortic atherosclerosis. Left ovarian cyst measuring almost 4 cm in diameter. Yearly follow-up suggested. Newly seen superior endplate fracture at L1, not present in January of  this year. Exact age indeterminate, but this could represent a recent minor compression fracture. Electronically Signed   By: Paulina Fusi M.D.   On: 12/06/2016 13:39   Dg Chest Port 1 View  Result Date: 12/06/2016 CLINICAL DATA:  Cough for unknown length of time, reported by dialysis staff during pt's appointment today. Pt is a very poor historian. Pt is currently wearing a brace on her left shoulder due to recent humerus fracture and surgical repair.*comment was truncated* EXAM: PORTABLE CHEST 1 VIEW COMPARISON:  04/26/2016 FINDINGS: Mild enlarged cardiac silhouette. RIGHT cysts venous line with distal tips in the RIGHT atrium. No pulmonary edema. No infiltrate. No pneumothorax. Mild venous congestion. IMPRESSION: Cardiomegaly without pulmonary edema. Electronically Signed   By: Genevive Bi M.D.   On: 12/06/2016 12:58    Procedures Procedures (including critical care time)  Medications Ordered in ED Medications  oxyCODONE (Oxy IR/ROXICODONE) immediate release tablet 5 mg (5 mg Oral Given 12/06/16 1256)     Initial Impression / Assessment and Plan / ED Course  I have reviewed the triage vital signs and the nursing notes.  Pertinent labs & imaging results that were available during my care of the patient were reviewed by me and considered in my medical decision making (see chart for details).  71 year old female presents with right sided abdominal pain. Very unclear history. She is hypertensive but otherwise vitals are normal. Abdomen is soft but she does have right sided tenderness. Labs are overall at baseline. CXR is  negatie. CT shows questionable colitis. I attempted to call her caregiver to update plan however they were unavailable. Discussed with Dr. Rush Landmark. Will treat with Cipro, Flagyl and short course or pain medicine and d/c  Final Clinical Impressions(s) / ED Diagnoses   Final diagnoses:  Colitis    New Prescriptions New Prescriptions   No medications on file     Beryle Quant 12/07/16 1610    Tegeler, Canary Brim, MD 12/07/16 1023

## 2016-12-07 ENCOUNTER — Telehealth: Payer: Self-pay | Admitting: Emergency Medicine

## 2016-12-07 NOTE — Telephone Encounter (Signed)
Ernst Breach D with [facility] called with concerns for oxy and cipro medication interactions from new prescriptions pt was D/C with yesterday.  Discussed concerns with Dr Effie Shy who spoke at length with both Clydie Braun and a pharmacist here.  No further CM needs noted.

## 2016-12-07 NOTE — ED Provider Notes (Signed)
Patient's pharmacy called and were concerned about concurrent dosing of Cipro and oxycodone.  Patient was written for oxycodone 5 mg, as well as Cipro 500 mg twice daily, to treat colitis.  I discussed the treatment dosing with pharmacy and change the Cipro to 250 mg daily 7 days.  I asked the pharmacist to give usual sedation precautions regarding the oxycodone treatment.   Mancel Bale, MD 12/07/16 [phone]

## 2016-12-11 ENCOUNTER — Encounter (HOSPITAL_COMMUNITY): Payer: Self-pay | Admitting: Emergency Medicine

## 2016-12-11 ENCOUNTER — Emergency Department (HOSPITAL_COMMUNITY)
Admission: EM | Admit: 2016-12-11 | Discharge: 2016-12-11 | Disposition: A | Discharge status: Home / Self Care | Payer: Medicare Other | Attending: Emergency Medicine | Admitting: Emergency Medicine

## 2016-12-11 DIAGNOSIS — E039 Hypothyroidism, unspecified: Secondary | ICD-10-CM | POA: Diagnosis not present

## 2016-12-11 DIAGNOSIS — S42302D Unspecified fracture of shaft of humerus, left arm, subsequent encounter for fracture with routine healing: Secondary | ICD-10-CM | POA: Diagnosis not present

## 2016-12-11 DIAGNOSIS — R1084 Generalized abdominal pain: Secondary | ICD-10-CM | POA: Diagnosis not present

## 2016-12-11 DIAGNOSIS — E1122 Type 2 diabetes mellitus with diabetic chronic kidney disease: Secondary | ICD-10-CM | POA: Diagnosis not present

## 2016-12-11 DIAGNOSIS — W010XXA Fall on same level from slipping, tripping and stumbling without subsequent striking against object, initial encounter: Secondary | ICD-10-CM | POA: Insufficient documentation

## 2016-12-11 DIAGNOSIS — I1310 Hypertensive heart and chronic kidney disease without heart failure, with stage 1 through stage 4 chronic kidney disease, or unspecified chronic kidney disease: Secondary | ICD-10-CM | POA: Diagnosis not present

## 2016-12-11 DIAGNOSIS — Z79899 Other long term (current) drug therapy: Secondary | ICD-10-CM | POA: Insufficient documentation

## 2016-12-11 DIAGNOSIS — R69 Illness, unspecified: Secondary | ICD-10-CM

## 2016-12-11 DIAGNOSIS — N189 Chronic kidney disease, unspecified: Secondary | ICD-10-CM | POA: Diagnosis not present

## 2016-12-11 LAB — LIPASE, BLOOD: Lipase: 90 U/L — ABNORMAL HIGH (ref 11–51)

## 2016-12-11 LAB — CBC WITH DIFFERENTIAL/PLATELET
BAND NEUTROPHILS: 0 %
BASOS ABS: 0 10*3/uL (ref 0.0–0.1)
BASOS PCT: 0 %
Blasts: 0 %
EOS ABS: 0 10*3/uL (ref 0.0–0.7)
Eosinophils Relative: 0 %
HCT: 32.1 % — ABNORMAL LOW (ref 36.0–46.0)
HEMOGLOBIN: 10.5 g/dL — AB (ref 12.0–15.0)
Lymphocytes Relative: 14 %
Lymphs Abs: 1.2 10*3/uL (ref 0.7–4.0)
MCH: 30.6 pg (ref 26.0–34.0)
MCHC: 32.7 g/dL (ref 30.0–36.0)
MCV: 93.6 fL (ref 78.0–100.0)
MONO ABS: 0.8 10*3/uL (ref 0.1–1.0)
MYELOCYTES: 0 %
Metamyelocytes Relative: 0 %
Monocytes Relative: 10 %
NEUTROS PCT: 76 %
Neutro Abs: 6.4 10*3/uL (ref 1.7–7.7)
Other: 0 %
PROMYELOCYTES ABS: 0 %
Platelets: 125 10*3/uL — ABNORMAL LOW (ref 150–400)
RBC: 3.43 MIL/uL — ABNORMAL LOW (ref 3.87–5.11)
RDW: 18.1 % — ABNORMAL HIGH (ref 11.5–15.5)
WBC: 8.4 10*3/uL (ref 4.0–10.5)
nRBC: 0 /100 WBC

## 2016-12-11 LAB — COMPREHENSIVE METABOLIC PANEL
ALBUMIN: 2.4 g/dL — AB (ref 3.5–5.0)
ALK PHOS: 106 U/L (ref 38–126)
ALT: 31 U/L (ref 14–54)
ANION GAP: 13 (ref 5–15)
AST: 73 U/L — AB (ref 15–41)
BILIRUBIN TOTAL: 2.2 mg/dL — AB (ref 0.3–1.2)
BUN: 23 mg/dL — AB (ref 6–20)
CALCIUM: 8.9 mg/dL (ref 8.9–10.3)
CO2: 22 mmol/L (ref 22–32)
Chloride: 100 mmol/L — ABNORMAL LOW (ref 101–111)
Creatinine, Ser: 4.94 mg/dL — ABNORMAL HIGH (ref 0.44–1.00)
GFR calc Af Amer: 9 mL/min — ABNORMAL LOW (ref >60)
GFR, EST NON AFRICAN AMERICAN: 8 mL/min — AB (ref >60)
GLUCOSE: 65 mg/dL (ref 65–99)
POTASSIUM: 4.2 mmol/L (ref 3.5–5.1)
Sodium: 135 mmol/L (ref 135–145)
TOTAL PROTEIN: 7.9 g/dL (ref 6.5–8.1)

## 2016-12-11 MED ORDER — OXYCODONE-ACETAMINOPHEN 5-325 MG PO TABS
2.0000 | ORAL_TABLET | Freq: Once | ORAL | Status: AC
  Administered 2016-12-11: 2 via ORAL
  Filled 2016-12-11: qty 2

## 2016-12-11 NOTE — ED Notes (Signed)
This RN attempted lab draw but was unsuccessful. Phlebotomy notified.

## 2016-12-11 NOTE — ED Notes (Signed)
Pt wheeled into waiting room waiting for grandson to pick up. Pt had all belongings with her.

## 2016-12-11 NOTE — ED Provider Notes (Signed)
[facility]-EMERGENCY DEPT Provider Note   CSN: [id number] Arrival date & time: 12/11/16  1605     History   Chief Complaint Chief Complaint  Patient presents with  . Abdominal Pain    HPI Samantha [name] is a 71 y.o. female.  HPI Patient is a very difficult historian. There are no family members or caregivers to assist with history of present illness. Patient is telling me "nobody has come to help me with my pain". Patient is focused on having me help her find some water that she reports is in one of her bags. I cannot discern from the patient's comments,if she is referring to the fact that she has not had any pain medicine since being in the emergency department, or she is referring to the fact that she has a known humerus fracture in the splint and no one is coming to her home to assist her. She is alert and focused on her commentary but extremely difficult to develop any specificity around the problem. Past Medical History:  Diagnosis Date  . Anemia   . Arthritis    left shoulder  . Cellulitis of left upper extremity   . Constipation   . Diabetes mellitus    borderline  . Dizziness   . Dry skin   . Gastric ulcer   . GI bleed 06/06/2011  . Gram-negative bacteremia   . H/O: GI bleed   . Headache(784.0)    occasionally  . Hepatitis    Hep B  . History of blood transfusion   . History of kidney stones   . Hypertension   . Hypothyroidism    takes Synthroid daily  . Impaired hearing   . Joint pain   . Joint swelling   . Mechanical complication of other vascular device, implant, and graft 10/08/2013  . Occasional numbness/prickling/tingling of fingers and toes   . Oligouria   . Pancreatitis 04/10/2012  . Peripheral edema   . Renal disorder    m, w, F   . Secondary hyperparathyroidism (HCC) 08/19/2016  . Sepsis (HCC) 06/14/2015  . Shoulder pain   . Vaginal bleeding 08/15/2015    Patient Active Problem List   Diagnosis Date Noted  . Secondary hyperparathyroidism (HCC)  08/19/2016  . Left supracondylar humerus fracture, closed, initial encounter 08/18/2016  . Adnexal mass   . ESRD on dialysis (HCC)   . 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 Thrombocytopenia (HCC) 12/25/2014  . 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 Cardiomegaly 12/25/2014  . Hyponatremia 04/12/2012  . SOB (shortness of breath) 04/08/2012  . Symptomatic cholelithiasis 11/16/2011  . Transaminitis 06/08/2011  . Hepatitis B antibody positive 06/08/2011  . ESRD on hemodialysis (HCC) 06/06/2011  . HTN (hypertension) 06/06/2011  . Shoulder pain 06/06/2011  . Anemia in chronic kidney disease (CKD) 06/06/2011    Past Surgical History:  Procedure Laterality Date  . ARTERIOVENOUS GRAFT PLACEMENT Left    forearm  . cataract surgery     bilateral  . CHOLECYSTECTOMY  12/26/2011  . CHOLECYSTECTOMY  12/26/2011   Procedure: LAPAROSCOPIC CHOLECYSTECTOMY;  Surgeon: Atilano Ina, MD,FACS;  Location: [facility] OR;  Service: General;  Laterality: N/A;  . ESOPHAGOGASTRODUODENOSCOPY  06/09/2011   Procedure: ESOPHAGOGASTRODUODENOSCOPY (EGD);  Surgeon: Theda Belfast, MD;  Location: [facility] ENDOSCOPY;  Service: Endoscopy;  Laterality: N/A;  . I&D EXTREMITY Left 03/23/2015   Procedure: DRAINAGE OF LEFT ARM SEROMA;  Surgeon: Fransisco Hertz, MD;  Location: [facility] OR;  Service: Vascular;  Laterality: Left;  . INSERTION OF DIALYSIS CATHETER Right 01/12/2015   Procedure: INSERTION OF DIALYSIS CATHETER;  Surgeon: Fransisco Hertz, MD;  Location: [facility] OR;  Service: Vascular;  Laterality: Right;  . LIGATION ARTERIOVENOUS GORTEX GRAFT Left 03/23/2015   Procedure: LIGATION AND EXCISION OF LEFT ARTERIOVENOUS GORTEX GRAFT;  Surgeon: Fransisco Hertz, MD;  Location: [facility] OR;  Service: Vascular;  Laterality: Left;  . REVERSE SHOULDER ARTHROPLASTY  09/22/2011   Procedure: REVERSE SHOULDER ARTHROPLASTY;  Surgeon: Verlee Rossetti, MD;  Location: [facility] OR;  Service: Orthopedics;  Laterality: Left;  left reverse shoulder  arthroplasty  . REVISION OF ARTERIOVENOUS GORETEX GRAFT Left 01/12/2015   Procedure: REVISION OF Left FOREARM ARTERIOVENOUS GORETEX GRAFT;  Surgeon: Fransisco Hertz, MD;  Location: [facility] OR;  Service: Vascular;  Laterality: Left;  . SHOULDER SURGERY  3-46yrs ago   right replacement  . SHUNTOGRAM Left 08/29/2012   Procedure: SHUNTOGRAM;  Surgeon: Fransisco Hertz, MD;  Location: [facility] CATH LAB;  Service: Cardiovascular;  Laterality: Left;  arm    OB History    No data available       Home Medications    Prior to Admission medications   Medication Sig Start Date End Date Taking? Authorizing Provider  acetaminophen (TYLENOL) 500 MG tablet Take 500 mg by mouth every 6 (six) hours as needed for moderate pain.    [provider]  amLODipine (NORVASC) 10 MG tablet Take 1 tablet (10 mg total) by mouth daily. 11/08/15   Linwood Dibbles, MD  B Complex-C-Folic Acid (RENAL MULTIVITAMIN FORMULA) TABS Take 1 tablet by mouth daily.    [provider]  cetirizine (ZYRTEC) 10 MG tablet Take 10 mg by mouth daily.    [provider]  ciprofloxacin (CIPRO) 500 MG tablet Take 1 tablet (500 mg total) by mouth 2 (two) times daily. 12/06/16   Bethel Born, PA-C  HYDROcodone-acetaminophen (NORCO/VICODIN) 5-325 MG tablet Take 1 tablet by mouth every 6 (six) hours as needed for moderate pain. 08/20/15   Leatha Gilding, MD  lactulose (CHRONULAC) 10 GM/15ML solution Take 45 mLs (30 g total) by mouth 2 (two) times daily. 06/18/15   Rolly Salter, MD  levothyroxine (SYNTHROID, LEVOTHROID) 50 MCG tablet Take 50 mcg by mouth daily before breakfast.  04/15/15   [provider]  metroNIDAZOLE (FLAGYL) 500 MG tablet Take 1 tablet (500 mg total) by mouth 3 (three) times daily. 12/06/16   Bethel Born, PA-C  nystatin (MYCOSTATIN) 100000 UNIT/ML suspension Take 5 mLs (500,000 Units total) by mouth 4 (four) times daily. 08/20/16   Rama, Maryruth Bun, MD  oxyCODONE (ROXICODONE) 5 MG immediate release  tablet Take 1 tablet (5 mg total) by mouth every 4 (four) hours as needed for severe pain. 12/06/16   Bethel Born, PA-C  pantoprazole (PROTONIX) 40 MG tablet Take 1 tablet (40 mg total) by mouth 2 (two) times daily before a meal. 06/18/15   Rolly Salter, MD  senna-docusate (SENOKOT-S) 8.6-50 MG tablet Take 1 tablet by mouth at bedtime as needed for mild constipation. 08/20/16   Rama, Maryruth Bun, MD    Family History Family History  Problem Relation Age of Onset  . Hypertension Mother   . Diabetes Mother   . Cancer Brother     Social History Social History  Substance Use Topics  . Smoking status: Never Smoker  . Smokeless tobacco: Current User    Types: Chew  . Alcohol use No     Comment:  quit 4-48yrs ago     Allergies   Banana; Chocolate; and Fish-derived products   Review of Systems Review of Systems Cannot obtain review of systems level V caveat hard of hearing and dementia  Physical Exam Updated Vital Signs BP (!) 89/46   Pulse 94   Temp 97.7 F (36.5 C) (Oral)   Resp 18   Ht 5\' 4"  (1.626 m)   Wt 81.6 kg (180 lb)   SpO2 97%   BMI 30.90 kg/m   Physical Exam  Constitutional:  Patient is alert and nontoxic. She is verbally interactive. Moderate obesity and deconditioning  HENT:  Head: Normocephalic and atraumatic.  Mouth/Throat: Oropharynx is clear and moist.  Eyes: EOM are normal.  Neck: Neck supple.  Cardiovascular: Normal rate, regular rhythm and normal heart sounds.   Pulmonary/Chest: Effort normal and breath sounds normal.  Abdominal: Soft. She exhibits no distension. There is tenderness.  Patient endorses diffuse tenderness to palpation. No guarding.  Musculoskeletal:  Patient is wearing a clamshell splint on her left humerus. The forearm and hand are warm and dry without edema. Patient is moving and using the hand. Patient has obesity of the lower legs but does not seem to have significant edema at this time. Skin Condition of lower legs is good  without evident cellulitis.  Neurological: She is alert. She exhibits normal muscle tone.  Patient is very alert and interactive. She seems extremely hard of hearing. Hard to discern dementia from hard of hearing. Patient follows commands. She is spontaneously moving extremities to meet her needs.  Skin: Skin is warm and dry.  Psychiatric: She has a normal mood and affect.     ED Treatments / Results  Labs (all labs ordered are listed, but only abnormal results are displayed) Labs Reviewed  COMPREHENSIVE METABOLIC PANEL - Abnormal; Notable for the following:       Result Value   Chloride 100 (*)    BUN 23 (*)    Creatinine, Ser 4.94 (*)    Albumin 2.4 (*)    AST 73 (*)    Total Bilirubin 2.2 (*)    GFR calc non Af Amer 8 (*)    GFR calc Af Amer 9 (*)    All other components within normal limits  LIPASE, BLOOD - Abnormal; Notable for the following:    Lipase 90 (*)    All other components within normal limits  CBC WITH DIFFERENTIAL/PLATELET - Abnormal; Notable for the following:    RBC 3.43 (*)    Hemoglobin 10.5 (*)    HCT 32.1 (*)    RDW 18.1 (*)    Platelets 125 (*)    All other components within normal limits    EKG  EKG Interpretation None       Radiology No results found.  Procedures Procedures (including critical care time)  Medications Ordered in ED Medications  oxyCODONE-acetaminophen (PERCOCET/ROXICET) 5-325 MG per tablet 2 tablet (2 tablets Oral Given 12/11/16 1824)     Initial Impression / Assessment and Plan / ED Course  I have reviewed the triage vital signs and the nursing notes.  Pertinent labs & imaging results that were available during my care of the patient were reviewed by me and considered in my medical decision making (see chart for details).     Final Clinical Impressions(s) / ED Diagnoses   Final diagnoses:  Generalized abdominal pain  Closed fracture of shaft of left humerus with routine healing, unspecified fracture morphology,  subsequent encounter  Severe  comorbid illness  Patient is a fairly poor historian and localizing her complaints of pain. Alert and nontoxic. Physical examination does not suggest acute abdomen. He has not developed leukocytosis. She is afebrile. At this time, I do not feel that she needs repeat CT scan. Patient also is having problems with chronic pain with a humerus fracture from May. At this time, I most suspect chronic pain is the etiology of the patient's complaints. Patient was given oral Percocet in the emergency department. She is counseled she will need to continue to see her primary providers for ongoing management of pain associated with her humerus fracture and reassessment for her ongoing abdominal pain. At time of discharge, patient was alert and nontoxic. She was not exhibiting any signs of pain.  New Prescriptions Discharge Medication List as of 12/11/2016  9:22 PM       Arby Barrette, MD 12/13/16 [phone]

## 2016-12-11 NOTE — ED Triage Notes (Signed)
Pt in from dialysis via [facility] with c/o sharp RLQ abd pain after dialysis. Pt had 4.5L taken off. Recently dx with colitis, c/o diarrhea and nausea. Alert, VSS

## 2016-12-21 ENCOUNTER — Ambulatory Visit: Payer: Medicare Other | Admitting: Podiatry

## 2016-12-26 ENCOUNTER — Telehealth: Payer: Self-pay | Admitting: Cardiovascular Disease

## 2016-12-26 NOTE — Telephone Encounter (Signed)
Received records from [facility] for appointment on 01/05/17 with Dr Duke Salviaandolph.  Records put with Dr Leonides Sakeandolph's schedule for 01/05/17. lp

## 2017-01-03 ENCOUNTER — Encounter: Payer: Self-pay | Admitting: Vascular Surgery

## 2017-01-04 ENCOUNTER — Ambulatory Visit: Payer: Medicare Other | Admitting: Podiatry

## 2017-01-04 NOTE — Progress Notes (Deleted)
Established Dialysis Access   History of Present Illness   Samantha [name] is a 71 y.o. ([birth year]) female who presents for re-evaluation for permanent access.  This patient has previously had a L FA AVG which was revised but became infected and required removal.  Due to some deterioration her functional status, the patient and family had agreed to continue with [facility] based HD and forgo further permanent access placement.  Her POA, daughter, had an untimely death and her grandchildren became the pt's POA.  They return today for re-evaluation for permanent access due to recurrent infections related to his [facility]. ***  The patient's PMH, PSH, SH, and FamHx are unchanged from 06/11/15.  Current Outpatient Prescriptions  Medication Sig Dispense Refill  . acetaminophen (TYLENOL) 500 MG tablet Take 500 mg by mouth every 6 (six) hours as needed for moderate pain.    Marland Kitchen amLODipine (NORVASC) 10 MG tablet Take 1 tablet (10 mg total) by mouth daily. 30 tablet 1  . B Complex-C-Folic Acid (RENAL MULTIVITAMIN FORMULA) TABS Take 1 tablet by mouth daily.    . cetirizine (ZYRTEC) 10 MG tablet Take 10 mg by mouth daily.    . ciprofloxacin (CIPRO) 500 MG tablet Take 1 tablet (500 mg total) by mouth 2 (two) times daily. 14 tablet 0  . HYDROcodone-acetaminophen (NORCO/VICODIN) 5-325 MG tablet Take 1 tablet by mouth every 6 (six) hours as needed for moderate pain. 30 tablet 0  . lactulose (CHRONULAC) 10 GM/15ML solution Take 45 mLs (30 g total) by mouth 2 (two) times daily. 240 mL 0  . levothyroxine (SYNTHROID, LEVOTHROID) 50 MCG tablet Take 50 mcg by mouth daily before breakfast.     . metroNIDAZOLE (FLAGYL) 500 MG tablet Take 1 tablet (500 mg total) by mouth 3 (three) times daily. 21 tablet 0  . nystatin (MYCOSTATIN) 100000 UNIT/ML suspension Take 5 mLs (500,000 Units total) by mouth 4 (four) times daily. 60 mL 0  . oxyCODONE (ROXICODONE) 5 MG immediate release tablet Take 1 tablet (5 mg total) by mouth every 4  (four) hours as needed for severe pain. 15 tablet 0  . pantoprazole (PROTONIX) 40 MG tablet Take 1 tablet (40 mg total) by mouth 2 (two) times daily before a meal. 30 tablet 0  . senna-docusate (SENOKOT-S) 8.6-50 MG tablet Take 1 tablet by mouth at bedtime as needed for mild constipation. 30 tablet 0   No current facility-administered medications for this visit.     On ROS today: ***, ***   Physical Examination  ***There were no vitals filed for this visit. ***There is no height or weight on file to calculate BMI.  General {LOC:19197::"Somulent","Alert"}, {Orientation:19197::"Confused","O x 3"}, {Weight:19197::"Obese","Cachectic","WD"}, {General state of health:19197::"Ill appearing","Elderly","NAD"}  Pulmonary {Chest wall:19197::"Asx chest movement","Sym exp"}, {Air movt:19197::"Decreased *** air movt","good B air movt"}, {BS:19197::"rales on ***","rhonchi on ***","wheezing on ***","CTA B"}  Cardiac {Rhythm:19197::"Irregularly, irregular rate and rhythm","RRR, Nl S1, S2"}, {Murmur:19197::"Murmur present: ***","no Murmurs"}, {Rubs:19197::"Rub present: ***","No rubs"}, {Gallop:19197::"Gallop present: ***","No S3,S4"}  Vascular Vessel Right Left  Radial {Palpable:19197::"Not palpable","Faintly palpable","Palpable"} {Palpable:19197::"Not palpable","Faintly palpable","Palpable"}  Brachial {Palpable:19197::"Not palpable","Faintly palpable","Palpable"} {Palpable:19197::"Not palpable","Faintly palpable","Palpable"}  Ulnar {Palpable:19197::"Not palpable","Faintly palpable","Palpable"} {Palpable:19197::"Not palpable","Faintly palpable","Palpable"}    Musculo- skeletal M/S 5/5 throughout {MS:19197::"except ***"," "}, Extremities without ischemic changes {MS:19197::"except ***"," "}  Neurologic Pain and light touch intact in extremities{CN:19197::" except for decreased sensation in ***"," "}, Motor exam as listed above     Non-invasive Vascular Imaging   {side of body:30421359} Arm Access Duplex   (***):   Diameters:  ***  mm  Depth:  *** mm  PSV:  *** c/s  BUE Doppler (***):   R arm:   Brachial: {Signals:19197::"none","mono","bi","tri"}, *** mm  Radial: {Signals:19197::"none","mono","bi","tri"}, *** mm  Ulnar: {Signals:19197::"none","mono","bi","tri"}, *** mm  L arm:   Brachial: {Signals:19197::"none","mono","bi","tri"}, *** mm  Radial: {Signals:19197::"none","mono","bi","tri"}, *** mm  Ulnar: {Signals:19197::"none","mono","bi","tri"}, *** mm  BUE Vein Mapping  (***):   R arm: acceptable vein conduits include ***  L arm: acceptable vein conduits include ***   Outside Studies/Documentation   *** pages of outside documents were reviewed including: ***.   Medical Decision Making   Samantha [name] is a 71 y.o. female who presents with ESRD requiring hemodialysis, AMS vs dementia    Nephrology request change in [facility] due to poor flow rates.  Additional access options included: ***    Leonides Sake, MD, FACS [facility] Office: [phone] Pager: [phone]

## 2017-01-05 ENCOUNTER — Ambulatory Visit: Payer: Medicare Other | Admitting: Cardiovascular Disease

## 2017-01-05 ENCOUNTER — Ambulatory Visit: Payer: Medicare Other | Admitting: Vascular Surgery

## 2017-01-20 ENCOUNTER — Emergency Department (HOSPITAL_COMMUNITY): Payer: Medicare Other

## 2017-01-20 ENCOUNTER — Emergency Department (HOSPITAL_COMMUNITY)
Admission: EM | Admit: 2017-01-20 | Discharge: 2017-01-20 | Disposition: A | Discharge status: Home / Self Care | Payer: Medicare Other | Attending: Emergency Medicine | Admitting: Emergency Medicine

## 2017-01-20 ENCOUNTER — Encounter (HOSPITAL_COMMUNITY): Payer: Self-pay | Admitting: Emergency Medicine

## 2017-01-20 DIAGNOSIS — I120 Hypertensive chronic kidney disease with stage 5 chronic kidney disease or end stage renal disease: Secondary | ICD-10-CM | POA: Insufficient documentation

## 2017-01-20 DIAGNOSIS — R1013 Epigastric pain: Secondary | ICD-10-CM | POA: Insufficient documentation

## 2017-01-20 DIAGNOSIS — M549 Dorsalgia, unspecified: Secondary | ICD-10-CM | POA: Insufficient documentation

## 2017-01-20 DIAGNOSIS — F17220 Nicotine dependence, chewing tobacco, uncomplicated: Secondary | ICD-10-CM | POA: Insufficient documentation

## 2017-01-20 DIAGNOSIS — N186 End stage renal disease: Secondary | ICD-10-CM | POA: Diagnosis not present

## 2017-01-20 DIAGNOSIS — E039 Hypothyroidism, unspecified: Secondary | ICD-10-CM | POA: Diagnosis not present

## 2017-01-20 DIAGNOSIS — Z96611 Presence of right artificial shoulder joint: Secondary | ICD-10-CM | POA: Insufficient documentation

## 2017-01-20 DIAGNOSIS — Z79899 Other long term (current) drug therapy: Secondary | ICD-10-CM | POA: Diagnosis not present

## 2017-01-20 DIAGNOSIS — M79602 Pain in left arm: Secondary | ICD-10-CM | POA: Insufficient documentation

## 2017-01-20 DIAGNOSIS — E1122 Type 2 diabetes mellitus with diabetic chronic kidney disease: Secondary | ICD-10-CM | POA: Insufficient documentation

## 2017-01-20 DIAGNOSIS — Z992 Dependence on renal dialysis: Secondary | ICD-10-CM | POA: Diagnosis not present

## 2017-01-20 DIAGNOSIS — Z96612 Presence of left artificial shoulder joint: Secondary | ICD-10-CM | POA: Diagnosis not present

## 2017-01-20 DIAGNOSIS — F0390 Unspecified dementia without behavioral disturbance: Secondary | ICD-10-CM | POA: Insufficient documentation

## 2017-01-20 LAB — COMPREHENSIVE METABOLIC PANEL
ALBUMIN: 2.5 g/dL — AB (ref 3.5–5.0)
ALT: 25 U/L (ref 14–54)
AST: 60 U/L — AB (ref 15–41)
Alkaline Phosphatase: 124 U/L (ref 38–126)
Anion gap: 12 (ref 5–15)
BILIRUBIN TOTAL: 1.5 mg/dL — AB (ref 0.3–1.2)
BUN: 7 mg/dL (ref 6–20)
CHLORIDE: 96 mmol/L — AB (ref 101–111)
CO2: 23 mmol/L (ref 22–32)
Calcium: 10.1 mg/dL (ref 8.9–10.3)
Creatinine, Ser: 4.03 mg/dL — ABNORMAL HIGH (ref 0.44–1.00)
GFR calc Af Amer: 12 mL/min — ABNORMAL LOW (ref >60)
GFR calc non Af Amer: 10 mL/min — ABNORMAL LOW (ref >60)
GLUCOSE: 74 mg/dL (ref 65–99)
POTASSIUM: 4.3 mmol/L (ref 3.5–5.1)
SODIUM: 131 mmol/L — AB (ref 135–145)
Total Protein: 8.1 g/dL (ref 6.5–8.1)

## 2017-01-20 LAB — CBC
HEMATOCRIT: 34.4 % — AB (ref 36.0–46.0)
Hemoglobin: 10.7 g/dL — ABNORMAL LOW (ref 12.0–15.0)
MCH: 31.4 pg (ref 26.0–34.0)
MCHC: 31.1 g/dL (ref 30.0–36.0)
MCV: 100.9 fL — AB (ref 78.0–100.0)
Platelets: 61 10*3/uL — ABNORMAL LOW (ref 150–400)
RBC: 3.41 MIL/uL — ABNORMAL LOW (ref 3.87–5.11)
RDW: 21.1 % — AB (ref 11.5–15.5)
WBC: 3.1 10*3/uL — ABNORMAL LOW (ref 4.0–10.5)

## 2017-01-20 LAB — LIPASE, BLOOD: Lipase: 81 U/L — ABNORMAL HIGH (ref 11–51)

## 2017-01-20 MED ORDER — OXYCODONE-ACETAMINOPHEN 5-325 MG PO TABS
1.0000 | ORAL_TABLET | Freq: Once | ORAL | Status: AC
  Administered 2017-01-20: 1 via ORAL
  Filled 2017-01-20: qty 1

## 2017-01-20 MED ORDER — OXYCODONE-ACETAMINOPHEN 5-325 MG PO TABS: 1.0000 | ORAL_TABLET | Freq: Three times a day (TID) | ORAL | 0 refills | Status: DC | PRN

## 2017-01-20 NOTE — ED Notes (Signed)
CALLED [facility] FOR TRANSPORT HOME--Samantha [name]  

## 2017-01-20 NOTE — ED Notes (Signed)
Pt back from CT

## 2017-01-20 NOTE — ED Provider Notes (Signed)
Emergency Department Provider Note   I have reviewed the triage vital signs and the nursing notes.   HISTORY  Chief Complaint Back Pain and Abdominal Pain   HPI Samantha [name] is a 71 y.o. female as a very poor historian and only reiterates that her abdomen hurtsand that her arm hurts. Since having on for months. She does state her abdominal pain seems to be a little bit worse today. She states that her arm is hurting her since she fell and broke it. She states she's had it in a cast which did not seem to help. She usually gets pain medicine from her doctor but hasn't gotten any recently and is hurting because of it.  Level V Caveat secondary to dementia  Past Medical History:  Diagnosis Date  . Anemia   . Arthritis    left shoulder  . Cellulitis of left upper extremity   . Constipation   . Diabetes mellitus    borderline  . Dizziness   . Dry skin   . Gastric ulcer   . GI bleed 06/06/2011  . Gram-negative bacteremia   . H/O: GI bleed   . Headache(784.0)    occasionally  . Hepatitis    Hep B  . History of blood transfusion   . History of kidney stones   . Hypertension   . Hypothyroidism    takes Synthroid daily  . Impaired hearing   . Joint pain   . Joint swelling   . Mechanical complication of other vascular device, implant, and graft 10/08/2013  . Occasional numbness/prickling/tingling of fingers and toes   . Oligouria   . Pancreatitis 04/10/2012  . Peripheral edema   . Renal disorder    m, w, F   . Secondary hyperparathyroidism (HCC) 08/19/2016  . Sepsis (HCC) 06/14/2015  . Shoulder pain   . Vaginal bleeding 08/15/2015    Patient Active Problem List   Diagnosis Date Noted  . Secondary hyperparathyroidism (HCC) 08/19/2016  . Left supracondylar humerus fracture, closed, initial encounter 08/18/2016  . Adnexal mass   . ESRD on dialysis (HCC)   . 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 Thrombocytopenia (HCC) 12/25/2014  . 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 Cardiomegaly 12/25/2014  . Hyponatremia 04/12/2012  . SOB (shortness of breath) 04/08/2012  . Symptomatic cholelithiasis 11/16/2011  . Transaminitis 06/08/2011  . Hepatitis B antibody positive 06/08/2011  . ESRD on hemodialysis (HCC) 06/06/2011  . HTN (hypertension) 06/06/2011  . Shoulder pain 06/06/2011  . Anemia in chronic kidney disease (CKD) 06/06/2011    Past Surgical History:  Procedure Laterality Date  . ARTERIOVENOUS GRAFT PLACEMENT Left    forearm  . cataract surgery     bilateral  . CHOLECYSTECTOMY  12/26/2011  . CHOLECYSTECTOMY  12/26/2011   Procedure: LAPAROSCOPIC CHOLECYSTECTOMY;  Surgeon: Atilano Ina, MD,FACS;  Location: [facility] OR;  Service: General;  Laterality: N/A;  . ESOPHAGOGASTRODUODENOSCOPY  06/09/2011   Procedure: ESOPHAGOGASTRODUODENOSCOPY (EGD);  Surgeon: Theda Belfast, MD;  Location: [facility] ENDOSCOPY;  Service: Endoscopy;  Laterality: N/A;  . I&D EXTREMITY Left 03/23/2015   Procedure: DRAINAGE OF LEFT ARM SEROMA;  Surgeon: Fransisco Hertz, MD;  Location: [facility] OR;  Service: Vascular;  Laterality: Left;  . INSERTION OF DIALYSIS CATHETER Right 01/12/2015   Procedure: INSERTION OF DIALYSIS CATHETER;  Surgeon: Fransisco Hertz, MD;  Location: [facility] OR;  Service: Vascular;  Laterality: Right;  . LIGATION ARTERIOVENOUS GORTEX GRAFT Left 03/23/2015  Procedure: LIGATION AND EXCISION OF LEFT ARTERIOVENOUS GORTEX GRAFT;  Surgeon: Fransisco Hertz, MD;  Location: [facility] OR;  Service: Vascular;  Laterality: Left;  . REVERSE SHOULDER ARTHROPLASTY  09/22/2011   Procedure: REVERSE SHOULDER ARTHROPLASTY;  Surgeon: Verlee Rossetti, MD;  Location: [facility] OR;  Service: Orthopedics;  Laterality: Left;  left reverse shoulder arthroplasty  . REVISION OF ARTERIOVENOUS GORETEX GRAFT Left 01/12/2015   Procedure: REVISION OF Left FOREARM ARTERIOVENOUS GORETEX GRAFT;  Surgeon: Fransisco Hertz, MD;  Location: [facility] OR;  Service: Vascular;  Laterality: Left;  . SHOULDER SURGERY  3-58yrs  ago   right replacement  . SHUNTOGRAM Left 08/29/2012   Procedure: SHUNTOGRAM;  Surgeon: Fransisco Hertz, MD;  Location: [facility] CATH LAB;  Service: Cardiovascular;  Laterality: Left;  arm    Current Outpatient Rx  . Order #: [id number] Class: Historical Med  . Order #: [id number] Class: Print  . Order #: [id number] Class: Historical Med  . Order #: [id number] Class: Historical Med  . Order #: [id number] Class: Print  . Order #: [id number] Class: Print  . Order #: [id number] Class: Normal  . Order #: [id number] Class: Historical Med  . Order #: [id number] Class: Print  . Order #: [id number] Class: Normal  . Order #: [id number] Class: Print  . Order #: [id number] Class: Print  . Order #: [id number] Class: Normal  . Order #: [id number] Class: Normal    Allergies Banana; Chocolate; and Fish-derived products  Family History  Problem Relation Age of Onset  . Hypertension Mother   . Diabetes Mother   . Cancer Brother     Social History Social History  Substance Use Topics  . Smoking status: Never Smoker  . Smokeless tobacco: Current User    Types: Chew  . Alcohol use No     Comment: quit 4-88yrs ago    Review of Systems  Level V Caveat secondary to dementia ____________________________________________   PHYSICAL EXAM:  VITAL SIGNS: ED Triage Vitals [01/20/17 1418]  Enc Vitals Group     BP (!) 160/63     Pulse Rate (!) 101     Resp 17     Temp 97.8 F (36.6 C)     Temp Source Oral     SpO2 97 %   Constitutional: Alert and oriented. Well appearing and in no acute distress. Eyes: Conjunctivae are normal. PERRL. EOMI. Head: Atraumatic. Nose: No congestion/rhinnorhea. Mouth/Throat: Mucous membranes are moist.  Oropharynx non-erythematous. Neck: No stridor.  No meningeal signs.   Cardiovascular: Normal rate on my exam (tachy in triage), regular rhythm. Good peripheral circulation. Grossly normal heart sounds.   Respiratory: Normal respiratory effort.  No retractions. Lungs CTAB. Gastrointestinal:  Soft and slight diffuse tenderness without rebound or guarding. No distention.  Musculoskeletal: No lower extremity tenderness nor edema. Significant deformity of left upper arm, nontender, pulses intact.  Neurologic:  Normal speech and language. No gross focal neurologic deficits are appreciated.  Skin:  Skin is warm, dry and intact. No rash noted.  ____________________________________________   LABS (all labs ordered are listed, but only abnormal results are displayed)  Labs Reviewed  COMPREHENSIVE METABOLIC PANEL - Abnormal; Notable for the following:       Result Value   Sodium 131 (*)    Chloride 96 (*)    Creatinine, Ser 4.03 (*)    Albumin 2.5 (*)    AST 60 (*)    Total Bilirubin 1.5 (*)    GFR calc non Af Amer 10 (*)    GFR calc Af Amer 12 (*)    All other  components within normal limits  CBC - Abnormal; Notable for the following:    WBC 3.1 (*)    RBC 3.41 (*)    Hemoglobin 10.7 (*)    HCT 34.4 (*)    MCV 100.9 (*)    RDW 21.1 (*)    Platelets 61 (*)    All other components within normal limits  LIPASE, BLOOD - Abnormal; Notable for the following:    Lipase 81 (*)    All other components within normal limits   ____________________________________________  RADIOLOGY  Ct Renal Stone Study  Result Date: 01/20/2017 CLINICAL DATA:  Chronic generalized abdominal pain and back pain. Initial encounter. EXAM: CT ABDOMEN AND PELVIS WITHOUT CONTRAST TECHNIQUE: Multidetector CT imaging of the abdomen and pelvis was performed following the standard protocol without IV contrast. COMPARISON:  CT of the abdomen and pelvis from 12/06/2016 FINDINGS: Lower chest: The visualized lung bases are grossly clear, though difficult to fully assess due to motion artifact. Calcification is noted at the mitral valve. Hepatobiliary: There is a diffusely nodular contour to the liver, compatible with hepatic cirrhosis. Trace adjacent ascites is noted. No dominant mass is seen, though evaluation for  mass is limited without contrast. The patient is status post cholecystectomy, with clips noted at the gallbladder fossa. Pancreas: The pancreas is within normal limits. Spleen: The spleen is unremarkable in appearance. Scattered splenic varices are noted. Adrenals/Urinary Tract: The adrenal glands are unremarkable in appearance. There is relatively severe bilateral renal atrophy. A small left renal cyst is noted. Nonspecific perinephric stranding is noted bilaterally. There is no evidence of hydronephrosis. No renal or ureteral stones are identified. Stomach/Bowel: Scattered gastric and mild esophageal varices are noted. The stomach is otherwise grossly unremarkable, though difficult to fully assess without contrast. Small bowel loops are largely decompressed and grossly unremarkable in appearance. The appendix is normal in caliber, without evidence for appendicitis. There is mild wall thickening along the ascending colon, improved from the prior study and possibly reflecting a mild residual infectious or inflammatory colitis. Vascular/Lymphatic: Scattered calcification is seen along the abdominal aorta and its branches. The abdominal aorta is otherwise grossly unremarkable. The inferior vena cava is grossly unremarkable. No retroperitoneal lymphadenopathy is seen. No pelvic sidewall lymphadenopathy is identified. Reproductive: The bladder is decompressed and not well assessed. The uterus is grossly unremarkable in appearance. A 3.3 cm left adnexal cystic focus is nonspecific, relatively stable from August but mildly increased in size from 2017. Other: No additional soft tissue abnormalities are seen. Musculoskeletal: No acute osseous abnormalities are identified. The visualized musculature is unremarkable in appearance. IMPRESSION: 1. Mild wall thickening along the ascending colon is improved from the prior study and may reflect a mild residual inflammatory or infectious colitis. 2. Findings of hepatic cirrhosis  again noted, with trace ascites. Underlying splenic, gastric and mild esophageal varices noted. 3. Relatively severe bilateral renal atrophy. Small left renal cyst noted. 4. Scattered aortic atherosclerosis. 5. Nonspecific 3.3 cm left adnexal cystic focus. This is relatively stable from August, but mildly increased in size from 2017. Pelvic ultrasound could be considered for further evaluation, on an elective nonemergent basis. Electronically Signed   By: Roanna Raider M.D.   On: 01/20/2017 17:31    ____________________________________________   PROCEDURES  Procedure(s) performed:   Procedures   ____________________________________________   INITIAL IMPRESSION / ASSESSMENT AND PLAN / ED COURSE  Pertinent labs & imaging results that were available during my care of the patient were reviewed by me and considered in my  medical decision making (see chart for details).  Lipase slightly elevated. Pain apparently worse than her normal. Will give a dose of pain meds here and repeat CT. Will give short Rx for pain medication until she can follow up with Dr. Concepcion Elk. Suspect left arm deformity is baseline as it is not tender and doesn't seem to be any more painful than baseline.   I reviewed the patient's narcotic database history in all states and found that they had received 4 prescriptions for short courses of narcotics (2 from PCP and 2 from hospital) in the last 6 months.   Workup ok. Short course of pain meds given for chronic pains. Stable for discharge.  ____________________________________________  FINAL CLINICAL IMPRESSION(S) / ED DIAGNOSES  Final diagnoses:  Epigastric pain     MEDICATIONS GIVEN DURING THIS VISIT:  Medications  oxyCODONE-acetaminophen (PERCOCET/ROXICET) 5-325 MG per tablet 1 tablet (1 tablet Oral Given 01/20/17 1702)     NEW OUTPATIENT MEDICATIONS STARTED DURING THIS VISIT:  Discharge Medication List as of 01/20/2017  6:10 PM    START taking these  medications   Details  oxyCODONE-acetaminophen (PERCOCET) 5-325 MG tablet Take 1 tablet by mouth every 8 (eight) hours as needed for severe pain., Starting Sat 01/20/2017, Print        Note:  This document was prepared using Dragon voice recognition software and may include unintentional dictation errors.   Marily Memos, MD 01/20/17 [phone]

## 2017-01-20 NOTE — ED Notes (Signed)
Patient transported to CT 

## 2017-01-20 NOTE — ED Notes (Signed)
Called pt grandson x3 times, pt is up for discharge and is not capable of riding the bus.

## 2017-01-20 NOTE — ED Notes (Signed)
Looking for someone to pick patient up.  Message left.  Waiting for return call.  To continue to try other numbers.

## 2017-01-20 NOTE — ED Notes (Signed)
Spoke with Lyanne Co who is working and can not pick up patient..  Told to send patient by [facility].  Address verified.

## 2017-01-20 NOTE — ED Notes (Signed)
Patient is a very poor historian and is hard of hearing. It's difficult to discern chief complaint and whether or not patient's pain is acute or chronic. She c/o abd pain for months, but also talks about back and arm pain. Her left arm was fractured in May and it does not appear to have healed well. She states no one has given her any pain medication but she filled a prescription back in September.

## 2017-01-20 NOTE — ED Notes (Signed)
Left with [facility] at this time. 

## 2017-01-20 NOTE — ED Triage Notes (Signed)
Pt here for evaluation of chronic back and abdominal pain- per EMS. This pain is chronic for years. EMS states she is out of her pain medications. Pt very hard of hearing, only hears to left ear and best with deep voices.

## 2017-01-26 ENCOUNTER — Telehealth: Payer: Self-pay | Admitting: Cardiology

## 2017-01-26 NOTE — Telephone Encounter (Signed)
Closed Encounter  °

## 2017-01-30 ENCOUNTER — Ambulatory Visit: Payer: Medicare Other | Admitting: Vascular Surgery

## 2017-02-07 ENCOUNTER — Ambulatory Visit: Payer: Medicare Other | Admitting: Cardiology

## 2017-02-15 NOTE — H&P (Signed)
Samantha [name] is an 71 y.o. female.    Chief Complaint: left shoulder/arm pain  HPI: Pt is a 71 y.o. female complaining of left shoulder pain for multiple years. Pain had continually increased since the beginning. X-rays in the clinic show mid shaft humerus fracture s/p reverse total shoulder. Pt has tried various conservative treatments which have failed to alleviate their symptoms. Various options are discussed with the patient. Risks, benefits and expectations were discussed with the patient. Patient understand the risks, benefits and expectations and wishes to proceed with surgery.   PCP:  Fleet Contras, MD  D/C Plans: Home  PMH: Past Medical History:  Diagnosis Date  . Anemia   . Arthritis    left shoulder  . Cellulitis of left upper extremity   . Constipation   . Diabetes mellitus    borderline  . Dizziness   . Dry skin   . Gastric ulcer   . GI bleed 06/06/2011  . Gram-negative bacteremia   . H/O: GI bleed   . Headache(784.0)    occasionally  . Hepatitis    Hep B  . History of blood transfusion   . History of kidney stones   . Hypertension   . Hypothyroidism    takes Synthroid daily  . Impaired hearing   . Joint pain   . Joint swelling   . Mechanical complication of other vascular device, implant, and graft 10/08/2013  . Occasional numbness/prickling/tingling of fingers and toes   . Oligouria   . Pancreatitis 04/10/2012  . Peripheral edema   . Renal disorder    m, w, F   . Secondary hyperparathyroidism (HCC) 08/19/2016  . Sepsis (HCC) 06/14/2015  . Shoulder pain   . Vaginal bleeding 08/15/2015    PSH: Past Surgical History:  Procedure Laterality Date  . ARTERIOVENOUS GRAFT PLACEMENT Left    forearm  . cataract surgery     bilateral  . CHOLECYSTECTOMY  12/26/2011  . CHOLECYSTECTOMY  12/26/2011   Procedure: LAPAROSCOPIC CHOLECYSTECTOMY;  Surgeon: Atilano Ina, MD,FACS;  Location: [facility] OR;  Service: General;  Laterality: N/A;  . ESOPHAGOGASTRODUODENOSCOPY   06/09/2011   Procedure: ESOPHAGOGASTRODUODENOSCOPY (EGD);  Surgeon: Theda Belfast, MD;  Location: [facility] ENDOSCOPY;  Service: Endoscopy;  Laterality: N/A;  . I&D EXTREMITY Left 03/23/2015   Procedure: DRAINAGE OF LEFT ARM SEROMA;  Surgeon: Fransisco Hertz, MD;  Location: [facility] OR;  Service: Vascular;  Laterality: Left;  . INSERTION OF DIALYSIS CATHETER Right 01/12/2015   Procedure: INSERTION OF DIALYSIS CATHETER;  Surgeon: Fransisco Hertz, MD;  Location: [facility] OR;  Service: Vascular;  Laterality: Right;  . LIGATION ARTERIOVENOUS GORTEX GRAFT Left 03/23/2015   Procedure: LIGATION AND EXCISION OF LEFT ARTERIOVENOUS GORTEX GRAFT;  Surgeon: Fransisco Hertz, MD;  Location: [facility] OR;  Service: Vascular;  Laterality: Left;  . REVERSE SHOULDER ARTHROPLASTY  09/22/2011   Procedure: REVERSE SHOULDER ARTHROPLASTY;  Surgeon: Verlee Rossetti, MD;  Location: [facility] OR;  Service: Orthopedics;  Laterality: Left;  left reverse shoulder arthroplasty  . REVISION OF ARTERIOVENOUS GORETEX GRAFT Left 01/12/2015   Procedure: REVISION OF Left FOREARM ARTERIOVENOUS GORETEX GRAFT;  Surgeon: Fransisco Hertz, MD;  Location: [facility] OR;  Service: Vascular;  Laterality: Left;  . SHOULDER SURGERY  3-47yrs ago   right replacement  . SHUNTOGRAM Left 08/29/2012   Procedure: SHUNTOGRAM;  Surgeon: Fransisco Hertz, MD;  Location: [facility] CATH LAB;  Service: Cardiovascular;  Laterality: Left;  arm    Social History:  reports that she has  never smoked. Her smokeless tobacco use includes Chew. She reports that she does not drink alcohol or use drugs.  Allergies:  Allergies  Allergen Reactions  . Banana Other (See Comments)    Due to dialysis  . Chocolate Other (See Comments)    Due to dialysis  . Fish-Derived Products Other (See Comments)    Due to gout    Medications: No current facility-administered medications for this encounter.    Current Outpatient Prescriptions  Medication Sig Dispense Refill  . acetaminophen (TYLENOL) 500 MG tablet Take 500 mg by mouth every 6  (six) hours as needed for moderate pain.    Marland Kitchen. amLODipine (NORVASC) 10 MG tablet Take 1 tablet (10 mg total) by mouth daily. 30 tablet 1  . B Complex-C-Folic Acid (RENAL MULTIVITAMIN FORMULA) TABS Take 1 tablet by mouth daily.    . cetirizine (ZYRTEC) 10 MG tablet Take 10 mg by mouth daily.    . ciprofloxacin (CIPRO) 500 MG tablet Take 1 tablet (500 mg total) by mouth 2 (two) times daily. 14 tablet 0  . HYDROcodone-acetaminophen (NORCO/VICODIN) 5-325 MG tablet Take 1 tablet by mouth every 6 (six) hours as needed for moderate pain. 30 tablet 0  . lactulose (CHRONULAC) 10 GM/15ML solution Take 45 mLs (30 g total) by mouth 2 (two) times daily. 240 mL 0  . levothyroxine (SYNTHROID, LEVOTHROID) 50 MCG tablet Take 50 mcg by mouth daily before breakfast.     . metroNIDAZOLE (FLAGYL) 500 MG tablet Take 1 tablet (500 mg total) by mouth 3 (three) times daily. 21 tablet 0  . nystatin (MYCOSTATIN) 100000 UNIT/ML suspension Take 5 mLs (500,000 Units total) by mouth 4 (four) times daily. 60 mL 0  . oxyCODONE (ROXICODONE) 5 MG immediate release tablet Take 1 tablet (5 mg total) by mouth every 4 (four) hours as needed for severe pain. 15 tablet 0  . oxyCODONE-acetaminophen (PERCOCET) 5-325 MG tablet Take 1 tablet by mouth every 8 (eight) hours as needed for severe pain. 6 tablet 0  . pantoprazole (PROTONIX) 40 MG tablet Take 1 tablet (40 mg total) by mouth 2 (two) times daily before a meal. 30 tablet 0  . senna-docusate (SENOKOT-S) 8.6-50 MG tablet Take 1 tablet by mouth at bedtime as needed for mild constipation. 30 tablet 0    No results found for this or any previous visit (from the past 48 hour(s)). No results found.  ROS: Pain with rom of the left upper extremity  Physical Exam:  Alert and oriented 71 y.o. female in no acute distress Cranial nerves 2-12 intact Cervical spine: full rom with no tenderness, nv intact distally Chest: active breath sounds bilaterally, no wheeze rhonchi or rales Heart:  regular rate and rhythm, no murmur Abd: non tender non distended with active bowel sounds Hip is stable with rom  Left upper extremity with limited rom due to fx nv intact distally No rashes or edema distally  Assessment/Plan Assessment: left midshaft humerus fracture s/p reverse total  Plan: Patient will undergo a left humerus ORIF by Dr[facility]. Risks benefits and expectations were discussed with the patient. Patient understand risks, benefits and expectations and wishes to proceed.

## 2017-02-15 NOTE — Progress Notes (Signed)
Called and spoke to WinfredLaurie at Dr Ranell PatrickNorris office about the need for orders.

## 2017-02-15 NOTE — Pre-Procedure Instructions (Signed)
Samantha [name] M Brissette  02/15/2017      [id number] Samantha [name]- [place], [place] - [address] [place] [id number] Phone: [phone] Fax: [phone]    Your procedure is scheduled on  Nov 6  Report to [facility] Admitting at  2pm  Call this number if you have problems the morning of surgery:  [phone]   Remember:  Do not eat food or drink liquids after midnight.  Take these medicines the morning of surgery with A SIP OF WATER Amlodipine (Norvasc), Cetirizine (Zyrtec), Hydrocodone (Norco) or oxycodone (Percocet)  if needed, Levothyroxine (Synthroid), Oxycodone (Roxicodone) if needed, Pantoprazole (protonix), Cipro, and Metronidazole (Flagyl)  Stop taking aspirin, BC's, Goody's, Herbal medications, Fish Oil, Ibuprofen, Advil, Motrin, Aleve, Vitamins   Do not wear jewelry, make-up or nail polish.  Do not wear lotions, powders, or perfumes, or deoderant.  Do not shave 48 hours prior to surgery.  Men may shave face and neck.  Do not bring valuables to the hospital.  [facility] is not responsible for any belongings or valuables.  Contacts, dentures or bridgework may not be worn into surgery.  Leave your suitcase in the car.  After surgery it may be brought to your room.  For patients admitted to the hospital, discharge time will be determined by your treatment team.  Patients discharged the day of surgery will not be allowed to drive home.    Special instructions:  [place] - Preparing for Surgery  Before surgery, you can play an important role.  Because skin is not sterile, your skin needs to be as free of germs as possible.  You can reduce the number of germs on you skin by washing with CHG (chlorahexidine gluconate) soap before surgery.  CHG is an antiseptic cleaner which kills germs and bonds with the skin to continue killing germs even after washing.  Please DO NOT use if you have an allergy to CHG or antibacterial soaps.  If  your skin becomes reddened/irritated stop using the CHG and inform your nurse when you arrive at Short Stay.  Do not shave (including legs and underarms) for at least 48 hours prior to the first CHG shower.  You may shave your face.  Please follow these instructions carefully:   1.  Shower with CHG Soap the night before surgery and the   morning of Surgery.  2.  If you choose to wash your hair, wash your hair first as usual with your   normal shampoo.  3.  After you shampoo, rinse your hair and body thoroughly to remove the Shampoo.  4.  Use CHG as you would any other liquid soap.  You can apply chg directly to the skin and wash gently with scrungie or a clean washcloth.  5.  Apply the CHG Soap to your body ONLY FROM THE NECK DOWN.  Do not use on open wounds or open sores.  Avoid contact with your eyes, ears, mouth and genitals (private parts).  Wash genitals (private parts)  with your normal soap.  6.  Wash thoroughly, paying special attention to the area where your surgery will be performed.  7.  Thoroughly rinse your body with warm water from the neck down.  8.  DO NOT shower/wash with your normal soap after using and rinsing off  the CHG Soap.  9.  Pat yourself dry with a clean towel.            10.  Wear clean pajamas.            11.  Place clean sheets on your bed the night of your first shower and do not  sleep with pets.  Day of Surgery  Do not apply any lotions/deoderants the morning of surgery.  Please wear clean clothes to the hospital/surgery center.     Please read over the following fact sheets that you were given. Pain Booklet, Coughing and Deep Breathing and Surgical Site Infection Prevention

## 2017-02-16 ENCOUNTER — Encounter (HOSPITAL_COMMUNITY)
Admission: RE | Admit: 2017-02-16 | Discharge: 2017-02-16 | Disposition: A | Discharge status: Home / Self Care | Payer: Medicare Other | Source: Ambulatory Visit | Attending: Orthopedic Surgery | Admitting: Orthopedic Surgery

## 2017-02-16 ENCOUNTER — Encounter (HOSPITAL_COMMUNITY): Payer: Self-pay

## 2017-02-16 DIAGNOSIS — E119 Type 2 diabetes mellitus without complications: Secondary | ICD-10-CM | POA: Diagnosis not present

## 2017-02-16 DIAGNOSIS — Z01818 Encounter for other preprocedural examination: Secondary | ICD-10-CM | POA: Diagnosis present

## 2017-02-16 HISTORY — DX: Unspecified dementia, unspecified severity, without behavioral disturbance, psychotic disturbance, mood disturbance, and anxiety: F03.90

## 2017-02-16 HISTORY — DX: Gastro-esophageal reflux disease without esophagitis: K21.9

## 2017-02-16 LAB — BASIC METABOLIC PANEL
ANION GAP: 11 (ref 5–15)
BUN: 16 mg/dL (ref 6–20)
CHLORIDE: 96 mmol/L — AB (ref 101–111)
CO2: 24 mmol/L (ref 22–32)
Calcium: 9.1 mg/dL (ref 8.9–10.3)
Creatinine, Ser: 5.28 mg/dL — ABNORMAL HIGH (ref 0.44–1.00)
GFR calc Af Amer: 8 mL/min — ABNORMAL LOW (ref >60)
GFR calc non Af Amer: 7 mL/min — ABNORMAL LOW (ref >60)
GLUCOSE: 81 mg/dL (ref 65–99)
POTASSIUM: 3.4 mmol/L — AB (ref 3.5–5.1)
Sodium: 131 mmol/L — ABNORMAL LOW (ref 135–145)

## 2017-02-16 LAB — CBC
HEMATOCRIT: 33.3 % — AB (ref 36.0–46.0)
Hemoglobin: 10.6 g/dL — ABNORMAL LOW (ref 12.0–15.0)
MCH: 32 pg (ref 26.0–34.0)
MCHC: 31.8 g/dL (ref 30.0–36.0)
MCV: 100.6 fL — AB (ref 78.0–100.0)
PLATELETS: DECREASED 10*3/uL (ref 150–400)
RBC: 3.31 MIL/uL — AB (ref 3.87–5.11)
RDW: 18.7 % — ABNORMAL HIGH (ref 11.5–15.5)
WBC: 4 10*3/uL (ref 4.0–10.5)

## 2017-02-16 LAB — HEMOGLOBIN A1C
Hgb A1c MFr Bld: 3.9 % — ABNORMAL LOW (ref 4.8–5.6)
Mean Plasma Glucose: 65.23 mg/dL

## 2017-02-16 NOTE — Pre-Procedure Instructions (Signed)
Samantha [name]  02/16/2017      [id number] [place], [place] - [address] [place] [address] Phone: [phone] Fax: [phone]    Your procedure is scheduled on 02-20-2017 Tuesday .  Report to [facility] Admitting at 2:00 PM   Call this number if you have problems the morning of surgery:  [phone]   Remember:  Do not eat food or drink liquids after midnight.   Take these medicines the morning of surgery with A SIP OF WATER Tylenol if needed,,hydrocone if needed,levothyroxine(Synthroid),protonix,  STOP ASPIRIN,ANTIINFLAMATORIES (IBUPROFEN,ALEVE,MOTRIN,ADVIL,GOODY'S POWDERS),HERBAL SUPPLEMENTS,FISH OIL,AND VITAMINS 5-7 DAYS PRIOR TO SURGERY   Do not wear jewelry, make-up or nail polish.  Do not wear lotions, powders, or perfumes, or deoderant.  Do not shave 48 hours prior to surgery.     Do not bring valuables to the hospital.   [facility] is not responsible for any belongings or valuables.  Contacts, dentures or bridgework may not be worn into surgery.  Leave your suitcase in the car.  After surgery it may be brought to your room.  For patients admitted to the hospital, discharge time will be determined by your treatment team.  Patients discharged the day of surgery will not be allowed to drive home.   Special Instructions: [facility] - Preparing for Surgery  Before surgery, you can play an important role.  Because skin is not sterile, your skin needs to be as free of germs as possible.  You can reduce the number of germs on you skin by washing with CHG (chlorahexidine gluconate) soap before surgery.  CHG is an antiseptic cleaner which kills germs and bonds with the skin to continue killing germs even after washing.  Please DO NOT use if you have an allergy to CHG or antibacterial soaps.  If your skin becomes reddened/irritated stop using the CHG and inform your nurse when you arrive at Short  Stay.  Do not shave (including legs and underarms) for at least 48 hours prior to the first CHG shower.  You may shave your face.  Please follow these instructions carefully:   1.  Shower with CHG Soap the night before surgery and the   morning of Surgery.  2.  If you choose to wash your hair, wash your hair first as usual with your normal shampoo.  3.  After you shampoo, rinse your hair and body thoroughly to remove the  Shampoo.  4.  Use CHG as you would any other liquid soap.  You can apply chg directly  to the skin and wash gently with scrungie or a clean washcloth.  5.  Apply the CHG Soap to your body ONLY FROM THE NECK DOWN.   Do not use on open wounds or open sores.  Avoid contact with your eyes,  ears, mouth and genitals (private parts).  Wash genitals (private parts) with your normal soap.  6.  Wash thoroughly, paying special attention to the area where your surgery will be performed.  7.  Thoroughly rinse your body with warm water from the neck down.  8.  DO NOT shower/wash with your normal soap after using and rinsing o  the CHG Soap.  9.  Pat yourself dry with a clean towel.            10.  Wear clean pajamas.            11.  Place clean sheets on your bed the  night of your first shower and do not sleep with pets.  Day of Surgery  Do not apply any lotions/deodorants the morning of surgery.  Please wear clean clothes to the hospital/surgery center.   Please read over the following fact sheets that you were given. Surgical Site Infection Prevention

## 2017-02-19 NOTE — Progress Notes (Addendum)
Anesthesia Chart Review: Patient is a 71 year old female scheduled for left humerus ORIF on 02/20/17 by Dr. Beverely LowSteve [name]. Dr. [name] to assist. Case is posted for [facility].  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   PCP is Dr. [name].  Meds include amlodipine (not taking), lactulose (not taking), Norco, levothyroxine, Protonix.   BP 126/79   Pulse 95   Temp 36.6 C (Oral)   Resp 18   Ht 5\' 4"  (1.626 m)   Wt 163 lb 4 oz (74 kg)   SpO2 100%   BMI 28.02 kg/m   EKG 04/26/16: SR, non-specific anterior T wave abnormality. Motion in leads I, II, III.   Echo 08/17/15: Study Conclusions - Left ventricle: The cavity size was normal. There was severe   concentric hypertrophy. Systolic function was normal. The   estimated ejection fraction was in the range of 55% to 60%. Wall   motion was normal; there were no regional wall motion   abnormalities. Doppler parameters are consistent with abnormal   left ventricular relaxation (grade 1 diastolic dysfunction).   Doppler parameters are consistent with high ventricular filling   pressure. - Aortic valve: Transvalvular velocity was within the normal range.   There was no stenosis. There was no regurgitation. - Mitral valve: Calcified annulus. There was mild regurgitation. - Left atrium: The atrium was severely dilated. - Right ventricle: The cavity size was normal. Wall thickness was   normal. Systolic function was normal. - Tricuspid valve: There was mild regurgitation. - Pulmonary arteries: Systolic pressure was  within the normal   range. PA peak pressure: 26 mm Hg (S).  1V CXR 12/06/16: FINDINGS: Mild enlarged cardiac silhouette. RIGHT cysts [central] venous line with distal tips in the RIGHT atrium. No pulmonary edema. No infiltrate. No pneumothorax. Mild venous congestion. IMPRESSION: Cardiomegaly without pulmonary edema.  Preoperative labs noted. Na 131. K 3,4, H/H 10.6/33.3. PLATELET CLUMPS NOTED ON SMEAR, COUNT APPEARS DECREASED (previously 61K on 01/20/17, 125K 125 12/11/16, 102K 12/06/16 and 56-122K from 08/20/15-08/19/16) . A1c 3.9. She will need an ISTAT4 on arrival due to ESRD. I'll add LFTs and PT/PTT due to known liver disease/cirrhosis. LFTs have been mildly elevated in the past. She will also need a repeat platelet count--I will add a Hold Clot order since known history of thrombocytopenia and unclear at this point if she will require perioperative platelets. Defer additional orders to surgeon and/or anesthesiologist. I have left a message with Samantha [name] at Dr. Ranell [name]' office that patient will need a repeat platelet count prior to surgery and that previously count was 61K. (Update: 02/19/17 11:43 AM: Samantha [name] will review labs with Dr. Ranell [name]. She also reported that they did receive clearance notes from patient's PCP and nephrologist. Additional surgeon/anesthesiology input following evaluation and review of same day labs tomorrow.)  Samantha Ochsllison Alyus Mofield, PA-C [facility] Phone [phone] 02/19/2017 10:52 AM

## 2017-02-20 ENCOUNTER — Encounter (HOSPITAL_COMMUNITY): Payer: Self-pay | Admitting: *Deleted

## 2017-02-20 ENCOUNTER — Encounter (HOSPITAL_COMMUNITY)
Admission: RE | Disposition: A | Discharge status: Skilled Nursing Facility | Payer: Self-pay | Source: Ambulatory Visit | Attending: Orthopedic Surgery

## 2017-02-20 ENCOUNTER — Inpatient Hospital Stay (HOSPITAL_COMMUNITY): Payer: Medicare Other

## 2017-02-20 ENCOUNTER — Inpatient Hospital Stay (HOSPITAL_COMMUNITY)
Admission: RE | Admit: 2017-02-20 | Discharge: 2017-02-27 | DRG: 492 | Disposition: A | Discharge status: Skilled Nursing Facility | Payer: Medicare Other | Source: Ambulatory Visit | Attending: Orthopedic Surgery | Admitting: Orthopedic Surgery

## 2017-02-20 ENCOUNTER — Inpatient Hospital Stay (HOSPITAL_COMMUNITY): Payer: Medicare Other | Admitting: Vascular Surgery

## 2017-02-20 DIAGNOSIS — S42302K Unspecified fracture of shaft of humerus, left arm, subsequent encounter for fracture with nonunion: Secondary | ICD-10-CM

## 2017-02-20 DIAGNOSIS — E1122 Type 2 diabetes mellitus with diabetic chronic kidney disease: Secondary | ICD-10-CM | POA: Diagnosis present

## 2017-02-20 DIAGNOSIS — Z9181 History of falling: Secondary | ICD-10-CM

## 2017-02-20 DIAGNOSIS — F0390 Unspecified dementia without behavioral disturbance: Secondary | ICD-10-CM | POA: Diagnosis present

## 2017-02-20 DIAGNOSIS — E871 Hypo-osmolality and hyponatremia: Secondary | ICD-10-CM | POA: Diagnosis not present

## 2017-02-20 DIAGNOSIS — E039 Hypothyroidism, unspecified: Secondary | ICD-10-CM | POA: Diagnosis present

## 2017-02-20 DIAGNOSIS — N186 End stage renal disease: Secondary | ICD-10-CM | POA: Diagnosis present

## 2017-02-20 DIAGNOSIS — Z9889 Other specified postprocedural states: Secondary | ICD-10-CM

## 2017-02-20 DIAGNOSIS — M9732XA Periprosthetic fracture around internal prosthetic left shoulder joint, initial encounter: Secondary | ICD-10-CM | POA: Diagnosis present

## 2017-02-20 DIAGNOSIS — N2581 Secondary hyperparathyroidism of renal origin: Secondary | ICD-10-CM | POA: Diagnosis present

## 2017-02-20 DIAGNOSIS — Z992 Dependence on renal dialysis: Secondary | ICD-10-CM

## 2017-02-20 DIAGNOSIS — H9190 Unspecified hearing loss, unspecified ear: Secondary | ICD-10-CM | POA: Diagnosis present

## 2017-02-20 DIAGNOSIS — Z91018 Allergy to other foods: Secondary | ICD-10-CM

## 2017-02-20 DIAGNOSIS — N250 Renal osteodystrophy: Secondary | ICD-10-CM | POA: Diagnosis present

## 2017-02-20 DIAGNOSIS — S42302A Unspecified fracture of shaft of humerus, left arm, initial encounter for closed fracture: Principal | ICD-10-CM | POA: Diagnosis present

## 2017-02-20 DIAGNOSIS — D638 Anemia in other chronic diseases classified elsewhere: Secondary | ICD-10-CM | POA: Diagnosis present

## 2017-02-20 DIAGNOSIS — K219 Gastro-esophageal reflux disease without esophagitis: Secondary | ICD-10-CM | POA: Diagnosis present

## 2017-02-20 DIAGNOSIS — Z8781 Personal history of (healed) traumatic fracture: Secondary | ICD-10-CM

## 2017-02-20 DIAGNOSIS — Z96612 Presence of left artificial shoulder joint: Secondary | ICD-10-CM | POA: Diagnosis present

## 2017-02-20 DIAGNOSIS — I120 Hypertensive chronic kidney disease with stage 5 chronic kidney disease or end stage renal disease: Secondary | ICD-10-CM | POA: Diagnosis present

## 2017-02-20 DIAGNOSIS — Z8619 Personal history of other infectious and parasitic diseases: Secondary | ICD-10-CM

## 2017-02-20 DIAGNOSIS — F17290 Nicotine dependence, other tobacco product, uncomplicated: Secondary | ICD-10-CM | POA: Diagnosis present

## 2017-02-20 HISTORY — PX: ORIF HUMERUS FRACTURE: SHX2126

## 2017-02-20 LAB — BASIC METABOLIC PANEL
Anion gap: 11 (ref 5–15)
BUN: 10 mg/dL (ref 6–20)
CHLORIDE: 92 mmol/L — AB (ref 101–111)
CO2: 23 mmol/L (ref 22–32)
Calcium: 8.3 mg/dL — ABNORMAL LOW (ref 8.9–10.3)
Creatinine, Ser: 4.4 mg/dL — ABNORMAL HIGH (ref 0.44–1.00)
GFR calc Af Amer: 11 mL/min — ABNORMAL LOW (ref >60)
GFR calc non Af Amer: 9 mL/min — ABNORMAL LOW (ref >60)
GLUCOSE: 72 mg/dL (ref 65–99)
POTASSIUM: 3.5 mmol/L (ref 3.5–5.1)
Sodium: 126 mmol/L — ABNORMAL LOW (ref 135–145)

## 2017-02-20 LAB — CBC
HEMATOCRIT: 26.7 % — AB (ref 36.0–46.0)
Hemoglobin: 8.5 g/dL — ABNORMAL LOW (ref 12.0–15.0)
MCH: 31.8 pg (ref 26.0–34.0)
MCHC: 31.8 g/dL (ref 30.0–36.0)
MCV: 100 fL (ref 78.0–100.0)
PLATELETS: 92 10*3/uL — AB (ref 150–400)
RBC: 2.67 MIL/uL — ABNORMAL LOW (ref 3.87–5.11)
RDW: 18 % — AB (ref 11.5–15.5)
WBC: 4.1 10*3/uL (ref 4.0–10.5)

## 2017-02-20 LAB — PROTIME-INR
INR: 1.39
Prothrombin Time: 16.9 seconds — ABNORMAL HIGH (ref 11.4–15.2)

## 2017-02-20 LAB — HEPATIC FUNCTION PANEL
ALT: 14 U/L (ref 14–54)
AST: 30 U/L (ref 15–41)
Albumin: 2.2 g/dL — ABNORMAL LOW (ref 3.5–5.0)
Alkaline Phosphatase: 73 U/L (ref 38–126)
BILIRUBIN DIRECT: 0.5 mg/dL (ref 0.1–0.5)
BILIRUBIN INDIRECT: 1 mg/dL — AB (ref 0.3–0.9)
BILIRUBIN TOTAL: 1.5 mg/dL — AB (ref 0.3–1.2)
Total Protein: 7.6 g/dL (ref 6.5–8.1)

## 2017-02-20 LAB — PREPARE RBC (CROSSMATCH)

## 2017-02-20 LAB — GLUCOSE, CAPILLARY
Glucose-Capillary: 72 mg/dL (ref 65–99)
Glucose-Capillary: 88 mg/dL (ref 65–99)

## 2017-02-20 LAB — APTT: aPTT: 38 seconds — ABNORMAL HIGH (ref 24–36)

## 2017-02-20 SURGERY — OPEN REDUCTION INTERNAL FIXATION (ORIF) PROXIMAL HUMERUS FRACTURE
Anesthesia: General | Laterality: Left

## 2017-02-20 MED ORDER — MENTHOL 3 MG MT LOZG: 1.0000 | LOZENGE | OROMUCOSAL | Status: DC | PRN

## 2017-02-20 MED ORDER — SODIUM CHLORIDE 0.9 % IV SOLN: Freq: Once | INTRAVENOUS | Status: DC

## 2017-02-20 MED ORDER — ONDANSETRON HCL 4 MG/2ML IJ SOLN: 4.0000 mg | Freq: Four times a day (QID) | INTRAMUSCULAR | Status: DC | PRN

## 2017-02-20 MED ORDER — SENNOSIDES-DOCUSATE SODIUM 8.6-50 MG PO TABS: 1.0000 | ORAL_TABLET | Freq: Every evening | ORAL | Status: DC | PRN

## 2017-02-20 MED ORDER — METHOCARBAMOL 500 MG PO TABS
500.0000 mg | ORAL_TABLET | Freq: Four times a day (QID) | ORAL | Status: DC | PRN
  Administered 2017-02-21 – 2017-02-25 (×4): 500 mg via ORAL
  Filled 2017-02-20 (×4): qty 1

## 2017-02-20 MED ORDER — HYDROCODONE-ACETAMINOPHEN 5-325 MG PO TABS: 1.0000 | ORAL_TABLET | Freq: Four times a day (QID) | ORAL | 0 refills | Status: DC | PRN

## 2017-02-20 MED ORDER — SODIUM CHLORIDE 0.9 % IV SOLN
Freq: Once | INTRAVENOUS | Status: AC
  Administered 2017-02-20: 15:00:00 via INTRAVENOUS

## 2017-02-20 MED ORDER — PROPOFOL 10 MG/ML IV BOLUS
INTRAVENOUS | Status: DC | PRN
  Administered 2017-02-20: 80 mg via INTRAVENOUS

## 2017-02-20 MED ORDER — ACETAMINOPHEN ER 650 MG PO TBCR: 650.0000 mg | EXTENDED_RELEASE_TABLET | Freq: Three times a day (TID) | ORAL | Status: DC | PRN

## 2017-02-20 MED ORDER — FENTANYL CITRATE (PF) 250 MCG/5ML IJ SOLN
INTRAMUSCULAR | Status: AC
  Filled 2017-02-20: qty 5

## 2017-02-20 MED ORDER — ONDANSETRON HCL 4 MG PO TABS: 4.0000 mg | ORAL_TABLET | Freq: Four times a day (QID) | ORAL | Status: DC | PRN

## 2017-02-20 MED ORDER — ROCURONIUM BROMIDE 100 MG/10ML IV SOLN
INTRAVENOUS | Status: DC | PRN
  Administered 2017-02-20 (×2): 20 mg via INTRAVENOUS
  Administered 2017-02-20: 40 mg via INTRAVENOUS

## 2017-02-20 MED ORDER — BUPIVACAINE-EPINEPHRINE (PF) 0.25% -1:200000 IJ SOLN
INTRAMUSCULAR | Status: DC | PRN
  Administered 2017-02-20: 16 mL

## 2017-02-20 MED ORDER — CEFAZOLIN SODIUM-DEXTROSE 2-4 GM/100ML-% IV SOLN
2.0000 g | Freq: Three times a day (TID) | INTRAVENOUS | Status: AC
  Administered 2017-02-21 (×2): 2 g via INTRAVENOUS
  Filled 2017-02-20 (×3): qty 100

## 2017-02-20 MED ORDER — ROPIVACAINE HCL 5 MG/ML IJ SOLN
INTRAMUSCULAR | Status: DC | PRN
  Administered 2017-02-20: 25 mL via PERINEURAL

## 2017-02-20 MED ORDER — SODIUM CHLORIDE 0.9 % IV SOLN
INTRAVENOUS | Status: DC
  Administered 2017-02-20: 22:00:00 via INTRAVENOUS

## 2017-02-20 MED ORDER — HYDROCODONE-ACETAMINOPHEN 5-325 MG PO TABS
1.0000 | ORAL_TABLET | Freq: Four times a day (QID) | ORAL | Status: DC | PRN
  Administered 2017-02-21 – 2017-02-27 (×14): 1 via ORAL
  Filled 2017-02-20 (×14): qty 1

## 2017-02-20 MED ORDER — 0.9 % SODIUM CHLORIDE (POUR BTL) OPTIME
TOPICAL | Status: DC | PRN
  Administered 2017-02-20: 1000 mL

## 2017-02-20 MED ORDER — ACETAMINOPHEN 325 MG PO TABS
650.0000 mg | ORAL_TABLET | ORAL | Status: DC | PRN
  Administered 2017-02-24: 650 mg via ORAL
  Filled 2017-02-20 (×2): qty 2

## 2017-02-20 MED ORDER — GLYCOPYRROLATE 0.2 MG/ML IV SOSY
PREFILLED_SYRINGE | INTRAVENOUS | Status: DC | PRN
  Administered 2017-02-20: 0.4 mg via INTRAVENOUS

## 2017-02-20 MED ORDER — METOCLOPRAMIDE HCL 5 MG/ML IJ SOLN: 5.0000 mg | Freq: Three times a day (TID) | INTRAMUSCULAR | Status: DC | PRN

## 2017-02-20 MED ORDER — CEFAZOLIN SODIUM-DEXTROSE 2-4 GM/100ML-% IV SOLN
2.0000 g | INTRAVENOUS | Status: AC
  Administered 2017-02-20: 2 g via INTRAVENOUS

## 2017-02-20 MED ORDER — NEOSTIGMINE METHYLSULFATE 10 MG/10ML IV SOLN
INTRAVENOUS | Status: DC | PRN
  Administered 2017-02-20: 2 mg via INTRAVENOUS

## 2017-02-20 MED ORDER — ACETAMINOPHEN 650 MG RE SUPP: 650.0000 mg | RECTAL | Status: DC | PRN

## 2017-02-20 MED ORDER — BUPIVACAINE-EPINEPHRINE (PF) 0.25% -1:200000 IJ SOLN
INTRAMUSCULAR | Status: AC
  Filled 2017-02-20: qty 30

## 2017-02-20 MED ORDER — DEXTROSE 50 % IV SOLN
INTRAVENOUS | Status: DC | PRN
  Administered 2017-02-20: 12.5 g via INTRAVENOUS

## 2017-02-20 MED ORDER — PHENYLEPHRINE HCL 10 MG/ML IJ SOLN
INTRAVENOUS | Status: DC | PRN
  Administered 2017-02-20: 25 ug/min via INTRAVENOUS

## 2017-02-20 MED ORDER — FENTANYL CITRATE (PF) 100 MCG/2ML IJ SOLN: 25.0000 ug | INTRAMUSCULAR | Status: DC | PRN

## 2017-02-20 MED ORDER — FENTANYL CITRATE (PF) 100 MCG/2ML IJ SOLN
INTRAMUSCULAR | Status: AC
  Administered 2017-02-20: 50 ug via INTRAVENOUS
  Filled 2017-02-20: qty 2

## 2017-02-20 MED ORDER — FENTANYL CITRATE (PF) 100 MCG/2ML IJ SOLN
50.0000 ug | Freq: Once | INTRAMUSCULAR | Status: AC
  Administered 2017-02-20: 50 ug via INTRAVENOUS

## 2017-02-20 MED ORDER — LEVOTHYROXINE SODIUM 50 MCG PO TABS
50.0000 ug | ORAL_TABLET | Freq: Every day | ORAL | Status: DC
  Administered 2017-02-21 – 2017-02-27 (×7): 50 ug via ORAL
  Filled 2017-02-20 (×7): qty 1

## 2017-02-20 MED ORDER — METHOCARBAMOL 1000 MG/10ML IJ SOLN
500.0000 mg | Freq: Four times a day (QID) | INTRAVENOUS | Status: DC | PRN
  Filled 2017-02-20: qty 5

## 2017-02-20 MED ORDER — FENTANYL CITRATE (PF) 100 MCG/2ML IJ SOLN
INTRAMUSCULAR | Status: DC | PRN
  Administered 2017-02-20: 25 ug via INTRAVENOUS

## 2017-02-20 MED ORDER — MIDAZOLAM HCL 2 MG/2ML IJ SOLN
INTRAMUSCULAR | Status: AC
  Filled 2017-02-20: qty 2

## 2017-02-20 MED ORDER — POLYETHYLENE GLYCOL 3350 17 G PO PACK
17.0000 g | PACK | Freq: Every day | ORAL | Status: DC | PRN
Start: 2017-02-20 — End: 2017-02-23

## 2017-02-20 MED ORDER — MORPHINE SULFATE (PF) 4 MG/ML IV SOLN
2.0000 mg | INTRAVENOUS | Status: DC | PRN
  Administered 2017-02-21 – 2017-02-22 (×3): 4 mg via INTRAVENOUS
  Filled 2017-02-20 (×5): qty 1

## 2017-02-20 MED ORDER — CHLORHEXIDINE GLUCONATE 4 % EX LIQD: 60.0000 mL | Freq: Once | CUTANEOUS | Status: DC

## 2017-02-20 MED ORDER — PHENOL 1.4 % MT LIQD: 1.0000 | OROMUCOSAL | Status: DC | PRN

## 2017-02-20 MED ORDER — PANTOPRAZOLE SODIUM 40 MG PO TBEC
40.0000 mg | DELAYED_RELEASE_TABLET | Freq: Two times a day (BID) | ORAL | Status: DC
  Administered 2017-02-21 – 2017-02-26 (×10): 40 mg via ORAL
  Filled 2017-02-20 (×9): qty 1

## 2017-02-20 MED ORDER — DEXAMETHASONE SODIUM PHOSPHATE 10 MG/ML IJ SOLN
INTRAMUSCULAR | Status: DC | PRN
  Administered 2017-02-20: 10 mg via INTRAVENOUS

## 2017-02-20 MED ORDER — METOCLOPRAMIDE HCL 5 MG PO TABS: 5.0000 mg | ORAL_TABLET | Freq: Three times a day (TID) | ORAL | Status: DC | PRN

## 2017-02-20 MED ORDER — BISACODYL 10 MG RE SUPP
10.0000 mg | Freq: Every day | RECTAL | Status: DC | PRN
Start: 2017-02-20 — End: 2017-02-27

## 2017-02-20 SURGICAL SUPPLY — 71 items
BANDAGE ELASTIC 6 VELCRO ST LF (GAUZE/BANDAGES/DRESSINGS) ×3 IMPLANT
BIT DRILL 110X2.5XQCK CNCT (BIT) ×1 IMPLANT
BIT DRILL 2.5 (BIT) ×2
BIT DRILL 2.7 (BIT) ×1
BIT DRILL 2.7MM (BIT) ×1 IMPLANT
BIT DRL 110X2.5XQCK CNCT (BIT) ×1
BNDG GAUZE ELAST 4 BULKY (GAUZE/BANDAGES/DRESSINGS) ×3 IMPLANT
BUTTON CABLE 3.5 PERI LSS 2.5M (Button) ×15 IMPLANT
CABLE ASSY CERCLAGE SST 1.8X55 (Orthopedic Implant) ×18 IMPLANT
CLOSURE WOUND 1/2 X4 (GAUZE/BANDAGES/DRESSINGS)
COVER SURGICAL LIGHT HANDLE (MISCELLANEOUS) ×3 IMPLANT
DRAPE C-ARM 42X72 X-RAY (DRAPES) IMPLANT
DRAPE C-ARM MINI 42X72 WSTRAPS (DRAPES) ×3 IMPLANT
DRAPE IMP U-DRAPE 54X76 (DRAPES) ×3 IMPLANT
DRAPE INCISE IOBAN 66X45 STRL (DRAPES) ×3 IMPLANT
DRAPE ORTHO SPLIT 77X108 STRL (DRAPES) ×4
DRAPE SURG ORHT 6 SPLT 77X108 (DRAPES) ×2 IMPLANT
DRAPE U-SHAPE 47X51 STRL (DRAPES) ×3 IMPLANT
DRILL BIT 2.7MM (BIT) ×2
DRSG ADAPTIC 3X8 NADH LF (GAUZE/BANDAGES/DRESSINGS) ×3 IMPLANT
DRSG EMULSION OIL 3X3 NADH (GAUZE/BANDAGES/DRESSINGS) IMPLANT
DURAPREP 26ML APPLICATOR (WOUND CARE) ×3 IMPLANT
ELECT NEEDLE BLADE 2-5/6 (NEEDLE) ×3 IMPLANT
ELECT REM PT RETURN 9FT ADLT (ELECTROSURGICAL) ×3
ELECTRODE REM PT RTRN 9FT ADLT (ELECTROSURGICAL) ×1 IMPLANT
GAUZE SPONGE 4X4 12PLY STRL (GAUZE/BANDAGES/DRESSINGS) ×3 IMPLANT
GLOVE BIOGEL PI ORTHO PRO 7.5 (GLOVE) ×2
GLOVE BIOGEL PI ORTHO PRO SZ8 (GLOVE) ×2
GLOVE ORTHO TXT STRL SZ7.5 (GLOVE) ×3 IMPLANT
GLOVE PI ORTHO PRO STRL 7.5 (GLOVE) ×1 IMPLANT
GLOVE PI ORTHO PRO STRL SZ8 (GLOVE) ×1 IMPLANT
GLOVE SURG ORTHO 8.5 STRL (GLOVE) ×3 IMPLANT
GOWN STRL REUS W/ TWL XL LVL3 (GOWN DISPOSABLE) ×2 IMPLANT
GOWN STRL REUS W/TWL XL LVL3 (GOWN DISPOSABLE) ×4
KIT BASIN OR (CUSTOM PROCEDURE TRAY) ×3 IMPLANT
KIT INFUSE SMALL (Orthopedic Implant) ×3 IMPLANT
KIT ROOM TURNOVER OR (KITS) ×3 IMPLANT
MANIFOLD NEPTUNE II (INSTRUMENTS) ×3 IMPLANT
NEEDLE HYPO 25GX1X1/2 BEV (NEEDLE) IMPLANT
NS IRRIG 1000ML POUR BTL (IV SOLUTION) ×3 IMPLANT
PACK SHOULDER (CUSTOM PROCEDURE TRAY) ×3 IMPLANT
PAD ABD 8X10 STRL (GAUZE/BANDAGES/DRESSINGS) ×3 IMPLANT
PAD ARMBOARD 7.5X6 YLW CONV (MISCELLANEOUS) ×6 IMPLANT
PADDING CAST ABS 6INX4YD NS (CAST SUPPLIES) ×2
PADDING CAST ABS COTTON 6X4 NS (CAST SUPPLIES) ×1 IMPLANT
PLATE BONE DUAL COMP 14H 183MM (Plate) ×3 IMPLANT
SCREW CORT 2.5X20X3.5XST SM (Screw) ×1 IMPLANT
SCREW CORT S/T 3.5X22 (Screw) ×3 IMPLANT
SCREW CORTICAL 3.5X20 (Screw) ×2 IMPLANT
SCREW LOCK 20X3.5X M THRD (Screw) ×3 IMPLANT
SCREW LOCK 22X3.5X M THRD (Screw) ×1 IMPLANT
SCREW LOCKING 18X3.5MM (Screw) ×3 IMPLANT
SCREW LOCKING 3.5 24MM (Screw) ×3 IMPLANT
SCREW LOCKING 3.5X20MM (Screw) ×6 IMPLANT
SCREW LOCKING 3.5X22 (Screw) ×2 IMPLANT
SLING ARM FOAM STRAP LRG (SOFTGOODS) ×3 IMPLANT
SPONGE LAP 4X18 X RAY DECT (DISPOSABLE) ×9 IMPLANT
STAPLER VISISTAT 35W (STAPLE) ×3 IMPLANT
STRIP CLOSURE SKIN 1/2X4 (GAUZE/BANDAGES/DRESSINGS) IMPLANT
SUCTION FRAZIER HANDLE 10FR (MISCELLANEOUS) ×2
SUCTION TUBE FRAZIER 10FR DISP (MISCELLANEOUS) ×1 IMPLANT
SUT FIBERWIRE #2 38 T-5 BLUE (SUTURE)
SUT MNCRL AB 4-0 PS2 18 (SUTURE) ×3 IMPLANT
SUT VIC AB 0 CT1 27 (SUTURE) ×4
SUT VIC AB 0 CT1 27XBRD ANBCTR (SUTURE) ×2 IMPLANT
SUT VIC AB 2-0 CT1 27 (SUTURE) ×2
SUT VIC AB 2-0 CT1 TAPERPNT 27 (SUTURE) ×1 IMPLANT
SUTURE FIBERWR #2 38 T-5 BLUE (SUTURE) IMPLANT
SYR CONTROL 10ML LL (SYRINGE) ×3 IMPLANT
TOWEL OR 17X24 6PK STRL BLUE (TOWEL DISPOSABLE) IMPLANT
TOWEL OR 17X26 10 PK STRL BLUE (TOWEL DISPOSABLE) ×3 IMPLANT

## 2017-02-20 NOTE — Anesthesia Preprocedure Evaluation (Addendum)
Anesthesia Evaluation  Patient identified by MRN, date of birth, ID band Patient awake    Reviewed: Allergy & Precautions, H&P , NPO status , Patient's Chart, lab work & pertinent test results  Airway Mallampati: III  TM Distance: >3 FB Neck ROM: Full    Dental no notable dental hx. (+) Edentulous Upper, Edentulous Lower, Dental Advisory Given   Pulmonary neg pulmonary ROS,    Pulmonary exam normal breath sounds clear to auscultation       Cardiovascular hypertension, Pt. on medications  Rhythm:Regular Rate:Normal     Neuro/Psych  Headaches, negative psych ROS   GI/Hepatic Neg liver ROS, PUD, GERD  Medicated and Controlled,  Endo/Other  diabetesHypothyroidism   Renal/GU ESRF and DialysisRenal disease  negative genitourinary   Musculoskeletal  (+) Arthritis , Osteoarthritis,    Abdominal   Peds  Hematology negative hematology ROS (+) anemia ,   Anesthesia Other Findings   Reproductive/Obstetrics negative OB ROS                          Anesthesia Physical Anesthesia Plan  ASA: III  Anesthesia Plan: General   Post-op Pain Management:  Regional for Post-op pain   Induction: Intravenous  PONV Risk Score and Plan: 3 and Ondansetron, Dexamethasone and Treatment may vary due to age or medical condition  Airway Management Planned: Oral ETT  Additional Equipment:   Intra-op Plan:   Post-operative Plan: Extubation in OR  Informed Consent: I have reviewed the patients History and Physical, chart, labs and discussed the procedure including the risks, benefits and alternatives for the proposed anesthesia with the patient or authorized representative who has indicated his/her understanding and acceptance.   Dental advisory given  Plan Discussed with: CRNA  Anesthesia Plan Comments:        Anesthesia Quick Evaluation

## 2017-02-20 NOTE — Anesthesia Procedure Notes (Signed)
Anesthesia Regional Block: Supraclavicular block   Pre-Anesthetic Checklist: ,, timeout performed, Correct Patient, Correct Site, Correct Laterality, Correct Procedure, Correct Position, site marked, Risks and benefits discussed, pre-op evaluation,  At surgeon's request and post-op pain management  Laterality: Left  Prep: Maximum Sterile Barrier Precautions used, chloraprep       Needles:  Injection technique: Single-shot  Needle Type: Echogenic Stimulator Needle     Needle Length: 5cm  Needle Gauge: 22     Additional Needles:   Procedures:,,,, ultrasound used (permanent image in chart),,,,  Narrative:  Start time: 02/20/2017 4:05 PM End time: 02/20/2017 4:15 PM Injection made incrementally with aspirations every 5 mL. Anesthesiologist: Gaynelle AduFitzgerald, Marcellino Fidalgo, MD  Additional Notes: 2% Lidocaine skin wheel.

## 2017-02-20 NOTE — Anesthesia Procedure Notes (Signed)
Procedure Name: Intubation Date/Time: 02/20/2017 4:55 PM Performed by: Sammie Bench, CRNA Pre-anesthesia Checklist: Patient identified, Emergency Drugs available, Suction available and Patient being monitored Patient Re-evaluated:Patient Re-evaluated prior to induction Oxygen Delivery Method: Circle System Utilized Preoxygenation: Pre-oxygenation with 100% oxygen Induction Type: IV induction Ventilation: Mask ventilation without difficulty Laryngoscope Size: Mac and 3 Grade View: Grade I Tube type: Oral Tube size: 7.0 mm Number of attempts: 1 Airway Equipment and Method: Stylet and Oral airway Placement Confirmation: ETT inserted through vocal cords under direct vision,  positive ETCO2 and breath sounds checked- equal and bilateral Secured at: 21 cm Tube secured with: Tape Dental Injury: Teeth and Oropharynx as per pre-operative assessment

## 2017-02-20 NOTE — Anesthesia Postprocedure Evaluation (Signed)
Anesthesia Post Note  Patient: Samantha [name]  Procedure(s) Performed: LEFT HUMERUS OPEN REDUCTION INTERNAL FIXATION (Left )     Patient location during evaluation: PACU Anesthesia Type: General Level of consciousness: awake and alert Pain management: pain level controlled Vital Signs Assessment: post-procedure vital signs reviewed and stable Respiratory status: spontaneous breathing, nonlabored ventilation, respiratory function stable and patient connected to nasal cannula oxygen Cardiovascular status: blood pressure returned to baseline and stable Postop Assessment: no apparent nausea or vomiting Anesthetic complications: no    Last Vitals:  Vitals:   02/20/17 2035 02/20/17 2044  BP: 97/68 121/86  Pulse: 82 81  Resp: 15 16  Temp:    SpO2: 100% 100%    Last Pain:  Vitals:   02/20/17 2044  TempSrc:   PainSc: Asleep                 Jamaira Sherk DAVID

## 2017-02-20 NOTE — Discharge Instructions (Signed)
Do not use the left arm at all.  Keep the splint clean and dry and supported with pillows while in the home.  Use the sling while up and around.  Follow up in two weeks in the office, call [phone]

## 2017-02-20 NOTE — Transfer of Care (Signed)
Immediate Anesthesia Transfer of Care Note  Patient: Samantha [name]  Procedure(s) Performed: LEFT HUMERUS OPEN REDUCTION INTERNAL FIXATION (Left )  Patient Location: PACU  Anesthesia Type:General  Level of Consciousness: awake and alert   Airway & Oxygen Therapy: Patient Spontanous Breathing and Patient connected to nasal cannula oxygen  Post-op Assessment: Report given to RN and Post -op Vital signs reviewed and stable  Post vital signs: Reviewed and stable  Last Vitals:  Vitals:   02/20/17 1620 02/20/17 2017  BP:  (!) 132/94  Pulse: 98 87  Resp:  11  Temp:  (!) 36.4 C  SpO2: 100% 92%    Last Pain:  Vitals:   02/20/17 1515  TempSrc: Oral         Complications: No apparent anesthesia complications

## 2017-02-20 NOTE — Brief Op Note (Signed)
02/20/2017  8:29 PM  PATIENT:  Samantha [name]  71 y.o. female  PRE-OPERATIVE DIAGNOSIS:  left humerus peri-prosthetic fracture  POST-OPERATIVE DIAGNOSIS:  left humerus peri-prosthetic fracture, displaced, established nonunion  PROCEDURE:  Procedure(s): LEFT HUMERUS OPEN REDUCTION INTERNAL FIXATION (Left) Biomet LCDC plate with cables and Infuse bone grafting  SURGEON:  Surgeon(s) and Role:    Beverely Low* Lexys Milliner, MD - Primary    * Dominica SeverinGramig, William, MD - Assisting  PHYSICIAN ASSISTANT:   ASSISTANTS: Dominica SeverinWilliam Gramig MD   ANESTHESIA:   regional and general  EBL:  250 mL   BLOOD ADMINISTERED:600 CC PRBC  DRAINS: none   LOCAL MEDICATIONS USED:  MARCAINE     SPECIMEN:  No Specimen  DISPOSITION OF SPECIMEN:  N/A  COUNTS:  YES  TOURNIQUET:  * No tourniquets in log *  DICTATION: .Other Dictation: Dictation Number [id number]  PLAN OF CARE: Admit to inpatient   PATIENT DISPOSITION:  PACU - guarded condition.   Delay start of Pharmacological VTE agent (>24hrs) due to surgical blood loss or risk of bleeding: no

## 2017-02-20 NOTE — Consult Note (Signed)
Reason for Consult:ESRD Referring Physician: Dr. Netta Cedars  Chief Complaint: Left shoulder pain  HD Orders: MWF SGB T4hr EDW 73kg 3k/2.25Ca bath No Heparin Mircera 200q2weeks (last dose of 150 on 10/31) Last PTH 288   Assessment/Plan: 1. Mid shaft lt humeral fracture s/p reverse total shoulder but now s/p ORIF today (11/6). 2. ESRD - MWF _0  with last HD Monday (full treatment). - No acute indication tonight; will resume outpt MWF regimen tomorrow. 3. Renal osteodystrophy - last PTH 288 with a Phos of 3. 4. Anemia - s/p transfusions and currently receiving another unit; usually on Mircera 200 (just incr with last 150 given on 10/31). 5. Hypothyroidism 6. DM   HPI: Samantha [name] is an 71 y.o. female ESRD MWF SGB last hd on Monday for a full treatment with h/o prior reverse total shoulder but with persistent left shoulder pain for many years. She was found to have a mid shaft humeral fracture by plain film at ortho clinic. She had a ORIF today and now has mild pain at time of interview. She denies f/c/n/v; she also denies cp/dyspnea.  ROS Pertinent items are noted in HPI.  Chemistry and CBC: Creatinine  Date/Time Value Ref Range Status  08/20/2015 6.2 (A) 0.5 - 1.1 mg/dL Final  06/18/2015 8.7 (A) 0.5 - 1.1 mg/dL Final   Creatinine, Ser  Date/Time Value Ref Range Status  02/20/2017 02:28 PM 4.40 (H) 0.44 - 1.00 mg/dL Final  02/16/2017 08:56 AM 5.28 (H) 0.44 - 1.00 mg/dL Final  01/20/2017 02:26 PM 4.03 (H) 0.44 - 1.00 mg/dL Final  12/11/2016 06:42 PM 4.94 (H) 0.44 - 1.00 mg/dL Final  12/06/2016 01:07 PM 7.90 (H) 0.44 - 1.00 mg/dL Final  08/19/2016 07:41 AM 5.41 (H) 0.44 - 1.00 mg/dL Final  08/18/2016 07:44 PM 3.84 (H) 0.44 - 1.00 mg/dL Final    Comment:    DIALYSIS  08/18/2016 12:33 PM 7.18 (H) 0.44 - 1.00 mg/dL Final  04/25/2016 08:20 PM 7.27 (H) 0.44 - 1.00 mg/dL Final  11/08/2015 05:42 PM 3.32 (H) 0.44 - 1.00 mg/dL Final  08/20/2015 07:31 AM 6.16 (H) 0.44 - 1.00  mg/dL Final  08/19/2015 03:20 AM 4.14 (H) 0.44 - 1.00 mg/dL Final    Comment:    DELTA CHECK NOTED  08/18/2015 07:51 AM 7.21 (H) 0.44 - 1.00 mg/dL Final    Comment:    DELTA CHECK NOTED  08/17/2015 02:45 AM 4.51 (H) 0.44 - 1.00 mg/dL Final    Comment:    DELTA CHECK NOTED  08/16/2015 02:45 AM 8.74 (H) 0.44 - 1.00 mg/dL Final  08/15/2015 05:40 AM 7.03 (H) 0.44 - 1.00 mg/dL Final  08/14/2015 06:05 PM 6.47 (H) 0.44 - 1.00 mg/dL Final  06/18/2015 12:07 PM 8.70 (H) 0.44 - 1.00 mg/dL Final  06/17/2015 07:15 AM 6.05 (H) 0.44 - 1.00 mg/dL Final  06/16/2015 04:02 AM 8.60 (H) 0.44 - 1.00 mg/dL Final  06/15/2015 04:00 AM 6.28 (H) 0.44 - 1.00 mg/dL Final  06/14/2015 06:33 PM 5.17 (H) 0.44 - 1.00 mg/dL Final  12/28/2014 04:19 AM 7.65 (H) 0.44 - 1.00 mg/dL Final  12/27/2014 05:30 AM 5.71 (H) 0.44 - 1.00 mg/dL Final    Comment:    DELTA CHECK NOTED  12/26/2014 05:13 AM 9.52 (H) 0.44 - 1.00 mg/dL Final  12/25/2014 08:05 PM 8.72 (H) 0.44 - 1.00 mg/dL Final  09/11/2014 02:10 PM 7.99 (H) 0.44 - 1.00 mg/dL Final  08/03/2014 05:32 PM 9.60 (H) 0.50 - 1.10 mg/dL Final  05/13/2014 06:25 PM 5.12 (H)  0.50 - 1.10 mg/dL Final  05/13/2014 06:08 PM 4.20 (H) 0.50 - 1.10 mg/dL Final  08/29/2012 11:34 AM 6.27 (H) 0.50 - 1.10 mg/dL Final  04/13/2012 06:05 AM 5.04 (H) 0.50 - 1.10 mg/dL Final    Comment:    DELTA CHECK NOTED DIALYSIS  04/12/2012 05:00 AM 7.90 (H) 0.50 - 1.10 mg/dL Final  04/11/2012 05:00 AM 6.41 (H) 0.50 - 1.10 mg/dL Final  04/10/2012 05:00 AM 4.66 (H) 0.50 - 1.10 mg/dL Final  04/09/2012 05:00 AM 6.71 (H) 0.50 - 1.10 mg/dL Final  04/08/2012 05:22 PM 6.38 (H) 0.50 - 1.10 mg/dL Final  12/27/2011 06:48 AM 7.43 (H) 0.50 - 1.10 mg/dL Final  12/26/2011 06:52 AM 5.69 (H) 0.50 - 1.10 mg/dL Final  09/25/2011 07:38 AM 7.88 (H) 0.50 - 1.10 mg/dL Final  09/23/2011 09:45 AM 8.81 (H) 0.50 - 1.10 mg/dL Final  09/22/2011 08:12 AM 7.12 (H) 0.50 - 1.10 mg/dL Final  09/21/2011 01:59 PM 5.50 (H) 0.50 - 1.10  mg/dL Final  06/14/2011 08:35 AM 5.51 (H) 0.50 - 1.10 mg/dL Final    Comment:    DELTA CHECK NOTED  06/14/2011 01:53 AM 9.25 (H) 0.50 - 1.10 mg/dL Final  06/13/2011 06:00 AM 8.19 (H) 0.50 - 1.10 mg/dL Final  06/12/2011 05:10 AM 6.37 (H) 0.50 - 1.10 mg/dL Final  06/10/2011 04:30 AM 6.51 (H) 0.50 - 1.10 mg/dL Final  06/09/2011 03:50 AM 4.71 (H) 0.50 - 1.10 mg/dL Final    Comment:    DELTA CHECK NOTED  06/08/2011 09:16 AM 9.29 (H) 0.50 - 1.10 mg/dL Final  06/06/2011 07:20 PM 6.02 (H) 0.50 - 1.10 mg/dL Final   Recent Labs  Lab 02/16/17 0856 02/20/17 1428  NA 131* 126*  K 3.4* 3.5  CL 96* 92*  CO2 24 23  GLUCOSE 81 72  BUN 16 10  CREATININE 5.28* 4.40*  CALCIUM 9.1 8.3*   Recent Labs  Lab 02/16/17 0856 02/20/17 1428  WBC 4.0 4.1  HGB 10.6* 8.5*  HCT 33.3* 26.7*  MCV 100.6* 100.0  PLT PLATELET CLUMPS NOTED ON SMEAR, COUNT APPEARS DECREASED 92*   Liver Function Tests: Recent Labs  Lab 02/20/17 1428  AST 30  ALT 14  ALKPHOS 73  BILITOT 1.5*  PROT 7.6  ALBUMIN 2.2*   No results for input(s): LIPASE, AMYLASE in the last 168 hours. No results for input(s): AMMONIA in the last 168 hours. Cardiac Enzymes: No results for input(s): CKTOTAL, CKMB, CKMBINDEX, TROPONINI in the last 168 hours. Iron Studies: No results for input(s): IRON, TIBC, TRANSFERRIN, FERRITIN in the last 72 hours. PT/INR: _0 (inr:5)  Xrays/Other Studies: ) Results for orders placed or performed during the hospital encounter of 02/20/17 (from the past 48 hour(s))  CBC     Status: Abnormal   Collection Time: 02/20/17  2:28 PM  Result Value Ref Range   WBC 4.1 4.0 - 10.5 K/uL   RBC 2.67 (L) 3.87 - 5.11 MIL/uL   Hemoglobin 8.5 (L) 12.0 - 15.0 g/dL   HCT 26.7 (L) 36.0 - 46.0 %   MCV 100.0 78.0 - 100.0 fL   MCH 31.8 26.0 - 34.0 pg   MCHC 31.8 30.0 - 36.0 g/dL   RDW 18.0 (H) 11.5 - 15.5 %   Platelets 92 (L) 150 - 400 K/uL    Comment: REPEATED TO VERIFY SPECIMEN CHECKED FOR CLOTS PLATELET  COUNT CONFIRMED BY SMEAR   Hepatic function panel     Status: Abnormal   Collection Time: 02/20/17  2:28 PM  Result Value  Ref Range   Total Protein 7.6 6.5 - 8.1 g/dL   Albumin 2.2 (L) 3.5 - 5.0 g/dL   AST 30 15 - 41 U/L   ALT 14 14 - 54 U/L   Alkaline Phosphatase 73 38 - 126 U/L   Total Bilirubin 1.5 (H) 0.3 - 1.2 mg/dL   Bilirubin, Direct 0.5 0.1 - 0.5 mg/dL   Indirect Bilirubin 1.0 (H) 0.3 - 0.9 mg/dL  APTT     Status: Abnormal   Collection Time: 02/20/17  2:28 PM  Result Value Ref Range   aPTT 38 (H) 24 - 36 seconds    Comment:        IF BASELINE aPTT IS ELEVATED, SUGGEST PATIENT RISK ASSESSMENT BE USED TO DETERMINE APPROPRIATE ANTICOAGULANT THERAPY.   Protime-INR     Status: Abnormal   Collection Time: 02/20/17  2:28 PM  Result Value Ref Range   Prothrombin Time 16.9 (H) 11.4 - 15.2 seconds   INR 1.02   Basic metabolic panel     Status: Abnormal   Collection Time: 02/20/17  2:28 PM  Result Value Ref Range   Sodium 126 (L) 135 - 145 mmol/L   Potassium 3.5 3.5 - 5.1 mmol/L   Chloride 92 (L) 101 - 111 mmol/L   CO2 23 22 - 32 mmol/L   Glucose, Bld 72 65 - 99 mg/dL   BUN 10 6 - 20 mg/dL   Creatinine, Ser 4.40 (H) 0.44 - 1.00 mg/dL   Calcium 8.3 (L) 8.9 - 10.3 mg/dL   GFR calc non Af Amer 9 (L) >60 mL/min   GFR calc Af Amer 11 (L) >60 mL/min    Comment: (NOTE) The eGFR has been calculated using the CKD EPI equation. This calculation has not been validated in all clinical situations. eGFR's persistently <60 mL/min signify possible Chronic Kidney Disease.    Anion gap 11 5 - 15  Glucose, capillary     Status: None   Collection Time: 02/20/17  3:05 PM  Result Value Ref Range   Glucose-Capillary 72 65 - 99 mg/dL  Prepare RBC     Status: None   Collection Time: 02/20/17  3:39 PM  Result Value Ref Range   Order Confirmation ORDER PROCESSED BY BLOOD BANK   Type and screen [place]     Status: None (Preliminary result)   Collection Time: 02/20/17   3:56 PM  Result Value Ref Range   ABO/RH(D) A POS    Antibody Screen NEG    Sample Expiration 02/23/2017    Unit Number [id number]    Blood Component Type RED CELLS,LR    Unit division 00    Status of Unit ISSUED    Transfusion Status OK TO TRANSFUSE    Crossmatch Result Compatible    Unit Number [id number]    Blood Component Type RED CELLS,LR    Unit division 00    Status of Unit ISSUED    Transfusion Status OK TO TRANSFUSE    Crossmatch Result Compatible   Prepare RBC     Status: None   Collection Time: 02/20/17  5:50 PM  Result Value Ref Range   Order Confirmation BB SAMPLE OR UNITS ALREADY AVAILABLE   Prepare RBC     Status: None   Collection Time: 02/20/17  7:26 PM  Result Value Ref Range   Order Confirmation ORDER PROCESSED BY BLOOD BANK DUPLICATE REQUEST   Glucose, capillary     Status: None   Collection Time: 02/20/17  8:19  PM  Result Value Ref Range   Glucose-Capillary 88 65 - 99 mg/dL   No results found.  PMH:   Past Medical History:  Diagnosis Date  . Anemia   . Arthritis    left shoulder  . Cellulitis of left upper extremity   . Constipation   . Dementia   . Diabetes mellitus    borderline  . Dizziness   . Dry skin   . Gastric ulcer   . GERD (gastroesophageal reflux disease)   . GI bleed 06/06/2011  . Gram-negative bacteremia   . H/O: GI bleed   . Headache(784.0)    occasionally  . Hepatitis    Hep B  . History of blood transfusion   . History of kidney stones   . Hypertension   . Hypothyroidism    takes Synthroid daily  . Impaired hearing   . Joint pain   . Joint swelling   . Mechanical complication of other vascular device, implant, and graft 10/08/2013  . Occasional numbness/prickling/tingling of fingers and toes   . Oligouria   . Pancreatitis 04/10/2012  . Peripheral edema   . Renal disorder    m, w, F   . Secondary hyperparathyroidism ([place]) 08/19/2016  . Sepsis ([place]) 06/14/2015  . Shoulder pain   . Vaginal bleeding 08/15/2015     PSH:   Past Surgical History:  Procedure Laterality Date  . ARTERIOVENOUS GRAFT PLACEMENT Left    forearm  . cataract surgery     bilateral  . CHOLECYSTECTOMY  12/26/2011  . SHOULDER SURGERY  3-64yr ago   right replacement    Allergies:  Allergies  Allergen Reactions  . Aspirin   . Banana Other (See Comments)    Due to dialysis  . Chocolate Other (See Comments)    Due to dialysis  . Fish-Derived Products Other (See Comments)    Due to gout    Medications:   Prior to Admission medications   Medication Sig Start Date End Date Taking? Authorizing Provider  acetaminophen (TYLENOL) 650 MG CR tablet Take 650 mg by mouth every 8 (eight) hours as needed for pain.   Yes [provider]  HYDROcodone-acetaminophen (NORCO/VICODIN) 5-325 MG tablet Take 1 tablet by mouth every 6 (six) hours as needed for moderate pain. 08/20/15  Yes GCaren Griffins MD  levothyroxine (SYNTHROID, LEVOTHROID) 50 MCG tablet Take 50 mcg by mouth daily before breakfast.  04/15/15  Yes [provider]  pantoprazole (PROTONIX) 40 MG tablet Take 1 tablet (40 mg total) by mouth 2 (two) times daily before a meal. Patient taking differently: Take 40 mg by mouth daily.  06/18/15  Yes PLavina Hamman MD  amLODipine (NORVASC) 10 MG tablet Take 1 tablet (10 mg total) by mouth daily. Patient not taking: Reported on 02/15/2017 11/08/15   KDorie Rank MD  HYDROcodone-acetaminophen ([facility] 5-325 MG tablet Take 1 tablet every 6 (six) hours as needed by mouth for moderate pain. 02/20/17   NNetta Cedars MD  lactulose (CHRONULAC) 10 GM/15ML solution Take 45 mLs (30 g total) by mouth 2 (two) times daily. Patient not taking: Reported on 02/15/2017 06/18/15   PLavina Hamman MD  oxyCODONE (ROXICODONE) 5 MG immediate release tablet Take 1 tablet (5 mg total) by mouth every 4 (four) hours as needed for severe pain. Patient not taking: Reported on 02/15/2017 12/06/16   GRecardo Evangelist PA-C  oxyCODONE-acetaminophen  (PERCOCET) 5-325 MG tablet Take 1 tablet by mouth every 8 (eight) hours as needed for severe  pain. Patient not taking: Reported on 02/15/2017 01/20/17   Mesner, Corene Cornea, MD  senna-docusate (SENOKOT-S) 8.6-50 MG tablet Take 1 tablet by mouth at bedtime as needed for mild constipation. Patient taking differently: Take 1 tablet by mouth daily.  08/20/16   Rama, Venetia Maxon, MD    Discontinued Meds:   Medications Discontinued During This Encounter  Medication Reason  . acetaminophen (TYLENOL) 500 MG tablet Dose change  . B Complex-C-Folic Acid (RENAL MULTIVITAMIN FORMULA) TABS Patient has not taken in last 30 days  . cetirizine (ZYRTEC) 10 MG tablet Patient has not taken in last 30 days  . ciprofloxacin (CIPRO) 500 MG tablet Completed Course  . metroNIDAZOLE (FLAGYL) 500 MG tablet Completed Course  . nystatin (MYCOSTATIN) 100000 UNIT/ML suspension Completed Course  . 0.9 % irrigation (POUR BTL) Patient Discharge  . bupivacaine-epinephrine (MARCAINE W/ EPI) 0.25% -1:200000 injection Patient Discharge  . chlorhexidine (HIBICLENS) 4 % liquid 4 application Patient Transfer  . fentaNYL (SUBLIMAZE) injection 25-50 mcg Patient Transfer  . 0.9 %  sodium chloride infusion Patient Transfer  . acetaminophen (TYLENOL) CR tablet 027 mg Duplicate    Social History:  reports that  has never smoked. Her smokeless tobacco use includes chew. She reports that she does not drink alcohol or use drugs.  Family History:   Family History  Problem Relation Age of Onset  . Hypertension Mother   . Diabetes Mother   . Cancer Brother     Blood pressure 109/69, pulse 79, temperature 97.6 F (36.4 C), temperature source Axillary, resp. rate 16, height _0  (1.626 m), weight 74 kg (163 lb 4 oz), SpO2 97 %. General appearance: mild distress Head: Normocephalic, without obvious abnormality, atraumatic Eyes: negative Neck: no adenopathy, no carotid bruit, supple, symmetrical, trachea midline and thyroid not enlarged,  symmetric, no tenderness/mass/nodules Back: symmetric, no curvature. ROM normal. No CVA tenderness. Resp: diminished breath sounds bilaterally Chest wall: no tenderness Cardio: regular rate and rhythm, S1, S2 normal, no murmur, click, rub or gallop GI: soft, non-tender; bowel sounds normal; no masses,  no organomegaly Extremities: left shoulder immobilized and pointing to shoulder in discomfort Pulses: 2+ and symmetric Skin: Skin color, texture, turgor normal. No rashes or lesions Lymph nodes: Cervical, supraclavicular, and axillary nodes normal. Neurologic: Grossly normal       LIN, Hunt Oris, MD 02/20/2017, 10:19 PM

## 2017-02-21 ENCOUNTER — Other Ambulatory Visit: Payer: Self-pay

## 2017-02-21 LAB — TYPE AND SCREEN
ABO/RH(D): A POS
Antibody Screen: NEGATIVE
UNIT DIVISION: 0
Unit division: 0

## 2017-02-21 LAB — BPAM RBC
BLOOD PRODUCT EXPIRATION DATE: 201811262359
BLOOD PRODUCT EXPIRATION DATE: 201811262359
ISSUE DATE / TIME: 201811061752
ISSUE DATE / TIME: 201811061932
UNIT TYPE AND RH: 6200
Unit Type and Rh: 6200

## 2017-02-21 LAB — BASIC METABOLIC PANEL
ANION GAP: 10 (ref 5–15)
BUN: 14 mg/dL (ref 6–20)
CALCIUM: 8 mg/dL — AB (ref 8.9–10.3)
CO2: 22 mmol/L (ref 22–32)
Chloride: 94 mmol/L — ABNORMAL LOW (ref 101–111)
Creatinine, Ser: 5.06 mg/dL — ABNORMAL HIGH (ref 0.44–1.00)
GFR calc non Af Amer: 8 mL/min — ABNORMAL LOW (ref >60)
GFR, EST AFRICAN AMERICAN: 9 mL/min — AB (ref >60)
Glucose, Bld: 115 mg/dL — ABNORMAL HIGH (ref 65–99)
Potassium: 4.1 mmol/L (ref 3.5–5.1)
Sodium: 126 mmol/L — ABNORMAL LOW (ref 135–145)

## 2017-02-21 LAB — POCT I-STAT 4, (NA,K, GLUC, HGB,HCT)
Glucose, Bld: 70 mg/dL (ref 65–99)
HEMATOCRIT: 27 % — AB (ref 36.0–46.0)
HEMOGLOBIN: 9.2 g/dL — AB (ref 12.0–15.0)
POTASSIUM: 3.8 mmol/L (ref 3.5–5.1)
SODIUM: 128 mmol/L — AB (ref 135–145)

## 2017-02-21 LAB — HEMOGLOBIN AND HEMATOCRIT, BLOOD
HCT: 27.7 % — ABNORMAL LOW (ref 36.0–46.0)
HEMOGLOBIN: 9.2 g/dL — AB (ref 12.0–15.0)

## 2017-02-21 MED ORDER — PENTAFLUOROPROP-TETRAFLUOROETH EX AERO: 1.0000 "application " | INHALATION_SPRAY | CUTANEOUS | Status: DC | PRN

## 2017-02-21 MED ORDER — LIDOCAINE HCL (PF) 1 % IJ SOLN: 5.0000 mL | INTRAMUSCULAR | Status: DC | PRN

## 2017-02-21 MED ORDER — LIDOCAINE-PRILOCAINE 2.5-2.5 % EX CREA: 1.0000 "application " | TOPICAL_CREAM | CUTANEOUS | Status: DC | PRN

## 2017-02-21 MED ORDER — SODIUM CHLORIDE 0.9 % IV SOLN: 100.0000 mL | INTRAVENOUS | Status: DC | PRN

## 2017-02-21 MED ORDER — HEPARIN SODIUM (PORCINE) 1000 UNIT/ML DIALYSIS
1000.0000 [IU] | INTRAMUSCULAR | Status: DC | PRN
  Filled 2017-02-21: qty 1

## 2017-02-21 MED ORDER — ALTEPLASE 2 MG IJ SOLR: 2.0000 mg | Freq: Once | INTRAMUSCULAR | Status: DC | PRN

## 2017-02-21 NOTE — Procedures (Signed)
Patient seen on Hemodialysis. QB 400, UF goal 3.1L Treatment adjusted as needed.  [name] MD [facility]. Office # [phone] Pager # [phone] 10:40 AM

## 2017-02-21 NOTE — Progress Notes (Signed)
Patient ID: Samantha [name] Single, female   DOB: [birth year], 71 y.o.   MRN: [id number]  [facility] Progress Note   Assessment/ Plan:   1. Mid shaft left humerus fracture: POD #1 s/p ORIF. Her prophylactic Ancef dose is too high at the current frequency in the setting of ESRD and I will stop this medication s/p 2 doses of 2gm over the past 24hrs.  2. ESRD - MWF schedule with ongoing dialysis at this time. She appears euvolemic and does not have any critical labs noted.  3. Secondary hyperparathyroidism - last PTH 288 with a Phos of 3. Not on VDRA/sensipar and takes CaCO3 for phosphorus binding.  4. Anemia - s/p transfusions and currently receiving another unit; usually on Mircera 200 (just increased with last 150 given on 10/31). 5. Hypothyroidism: on levothyroxine supplementation 6. DM  Subjective:   Reports right shoulder and left arm (surgical site) pain   Objective:   BP (!) 98/57 (BP Location: Right Arm)   Pulse 88   Temp 97.7 F (36.5 C) (Oral)   Resp 16   Ht 5\' 4"  (1.626 [name])   Wt 74 kg (163 lb 4 oz)   SpO2 99%   BMI 28.02 kg/[name]   Physical Exam: WUJ:[id number]:comfortable on dialysis AOZ:HYQMVCVS:Pulse regular rhythm and normal rate Resp: clear bilaterally without rales/rhonchi HQI:ONGEAbd:soft, flat, NT Ext:No LE edema, LUA in iced wrap. RIJ TDC  Labs: BMET Recent Labs  Lab 02/16/17 0856 02/20/17 1428 02/21/17 0432  NA 131* 126* 126*  K 3.4* 3.5 4.1  CL 96* 92* 94*  CO2 24 23 22   GLUCOSE 81 72 115*  BUN 16 10 14   CREATININE 5.28* 4.40* 5.06*  CALCIUM 9.1 8.3* 8.0*   CBC Recent Labs  Lab 02/16/17 0856 02/20/17 1428 02/21/17 0432  WBC 4.0 4.1  --   HGB 10.6* 8.5* 9.2*  HCT 33.3* 26.7* 27.7*  MCV 100.6* 100.0  --   PLT PLATELET CLUMPS NOTED ON SMEAR, COUNT APPEARS DECREASED 92*  --    Medications:    . levothyroxine  50 mcg Oral QAC breakfast  . pantoprazole  40 mg Oral BID AC   Zetta BillsJay Lucia Mccreadie, MD 02/21/2017, 10:26 AM

## 2017-02-21 NOTE — Progress Notes (Signed)
Occupational Therapy Evaluation Patient Details Name: Samantha [name] M Detweiler MRN: [id number] DOB: 05-27-1945 Today's Date: 02/21/2017    History of Present Illness 71 y.o. female ESRD MWF. h/o prior reverse total shoulder but with persistent left shoulder pain for many years. She was found to have a mid shaft humeral fracture. Underwent L humeral ORIF 02/20/17.    Clinical Impression   PTA, pt lived at home with grandson, who works during the day. PTA, pt ambulated @ modified independent level with [place]. Modified independent with ADL, with the exception of assist with shower transfers.  Pt currently requires Max A for ADL and min A with mobility and expresses fear of falling. Discussed plan of care with grand daughter over the phone and educated family on need for assistance with all mobility and ADL at this time. Family states they plan to provide adequate assistance after DC. Recommend HHOT/PT and Home Health Aide and 3in1. Will plan to meet family tomorrow @ 1600 to educate on management of LUE with ADL prior to DC home. Nsg educated on pt's PLOF and POC.    Follow Up Recommendations  Home health OT;Supervision/Assistance - 24 hour(home health aide)    Equipment Recommendations  3 in 1 bedside commode    Recommendations for Other Services PT consult     Precautions / Restrictions Precautions Precautions: Shoulder Precaution Booklet Issued: Yes (comment) Precaution Comments: assume no shoulder ROM; encourage finger ROM Required Braces or Orthoses: Other Brace/Splint(L posterior post op splint) Restrictions Weight Bearing Restrictions: Yes LUE Weight Bearing: Non weight bearing      Mobility Bed Mobility Overal bed mobility: Needs Assistance Bed Mobility: Supine to Sit;Sit to Supine     Supine to sit: Mod assist Sit to supine: Mod assist      Transfers Overall transfer level: Needs assistance   Transfers: Sit to/from Stand Sit to Stand: Min assist              Balance  Overall balance assessment: History of Falls(fall @ 6 months ago)                                         ADL either performed or assessed with clinical judgement   ADL Overall ADL's : Needs assistance/impaired Eating/Feeding: Set up;Sitting   Grooming: Moderate assistance;Sitting   Upper Body Bathing: Moderate assistance;Bed level   Lower Body Bathing: Moderate assistance;Sit to/from stand   Upper Body Dressing : Maximal assistance;Sitting   Lower Body Dressing: Moderate assistance;Sit to/from stand   Toilet Transfer: Minimal assistance;+2 for safety/equipment   Toileting- Clothing Manipulation and Hygiene: Moderate assistance;Sit to/from stand Toileting - Clothing Manipulation Details (indicate cue type and reason): pt does not make urine     Functional mobility during ADLs: Minimal assistance;+2 for safety/equipment General ADL Comments: Pt stated "I'm going to fall"     Vision         Perception     Praxis      Pertinent Vitals/Pain Pain Assessment: Faces Faces Pain Scale: Hurts little more Pain Location: L shoulder Pain Descriptors / Indicators: Moaning;Grimacing Pain Intervention(s): Limited activity within patient's tolerance;Repositioned;Ice applied     Hand Dominance Right   Extremity/Trunk Assessment Upper Extremity Assessment Upper Extremity Assessment: LUE deficits/detail LUE Deficits / Details: moving fingers normally; LUE in postop splint; NEB LUE Coordination: decreased fine motor;decreased gross motor   Lower Extremity Assessment Lower Extremity Assessment: Generalized weakness  Cervical / Trunk Assessment Cervical / Trunk Assessment: Normal   Communication Communication Communication: HOH(Deaf R eye; heasr better with L ear)   Cognition Arousal/Alertness: Awake/alert Behavior During Therapy: WFL for tasks assessed/performed Overall Cognitive Status: (Family states she is at baseline; confusion after dialysis)                                      General Comments       Exercises Exercises: Other exercises Other Exercises Other Exercises: L hand composite flexion/extension   Shoulder Instructions      Home Living Family/patient expects to be discharged to:: Private residence Living Arrangements: Other relatives Available Help at Discharge: Family Type of Home: Apartment Home Access: Level entry     Home Layout: One level     Bathroom Shower/Tub: IT trainerTub/shower unit;Curtain   Bathroom Toilet: Standard Bathroom Accessibility: Yes How Accessible: Accessible via walker Home Equipment: Gilmer MorCane - single point   Additional Comments: Lives with grandson, who works during the day; family plans to have assistance when family at work      Prior Functioning/Environment Level of Independence: Independent with assistive device(s)        Comments: pt uses a cane at all times; sponge baths and dresses herself; family assists with shower on Sundays        OT Problem List: Decreased strength;Decreased range of motion;Decreased activity tolerance;Impaired balance (sitting and/or standing);Decreased coordination;Decreased safety awareness;Decreased knowledge of use of DME or AE;Decreased knowledge of precautions;Obesity;Impaired UE functional use;Pain      OT Treatment/Interventions: Self-care/ADL training;Therapeutic exercise;DME and/or AE instruction;Therapeutic activities;Patient/family education    OT Goals(Current goals can be found in the care plan section) Acute Rehab OT Goals Patient Stated Goal: to go home OT Goal Formulation: With patient/family Time For Goal Achievement: 03/07/17 Potential to Achieve Goals: Good  OT Frequency: Min 3X/week   Barriers to D/C:            Co-evaluation              AM-PAC PT "6 Clicks" Daily Activity     Outcome Measure Help from another person eating meals?: A Little Help from another person taking care of personal grooming?: A  Little Help from another person toileting, which includes using toliet, bedpan, or urinal?: A Little Help from another person bathing (including washing, rinsing, drying)?: A Lot Help from another person to put on and taking off regular upper body clothing?: A Lot Help from another person to put on and taking off regular lower body clothing?: A Little 6 Click Score: 16   End of Session Equipment Utilized During Treatment: Other (comment)(sling) Nurse Communication: Mobility status;Precautions;Weight bearing status;Other (comment)(DC plan)  Activity Tolerance: Patient tolerated treatment well Patient left: in bed;with call bell/phone within reach;with bed alarm set;with SCD's reapplied  OT Visit Diagnosis: Unsteadiness on feet (R26.81);Muscle weakness (generalized) (M62.81);Pain Pain - Right/Left: Left Pain - part of body: Shoulder                Time: [id number] OT Time Calculation (min): 30 min Charges:  OT General Charges $OT Visit: 1 Visit OT Evaluation $OT Eval Moderate Complexity: 1 Mod OT Treatments $Self Care/Home Management : 8-22 mins G-Codes:     [facility] Elizabeth Haff, OT/L  [phone] 02/21/2017  Caileen Veracruz,HILLARY 02/21/2017, 9:51 AM

## 2017-02-21 NOTE — Op Note (Signed)
NAMKirtland [name]:  Cavendish, Verlie              ACCOUNT NO.:  [id number]  MEDICAL RECORD NO.:  [id number]  LOCATION:                                 FACILITY:  PHYSICIAN:  [name], M.D.      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  PROCEDURE PERFORMED:  Open reduction internal fixation of left periprosthetic displaced humerus fracture with nonunion takedown and use of Infuse bone graft and local bone graft.  ATTENDING SURGEON:  [name], MD.  ASSISTANT:  Dominica SeverinWilliam Gramig, MD.  ANESTHESIA:  General anesthesia plus interscalene block anesthesia was used.  ESTIMATED BLOOD LOSS:  500 mL.  FLUID REPLACEMENT:  600 mL packed red blood cells and crystalloid.  INSTRUMENT COUNTS:  Correct.  COMPLICATIONS:  There were no complications.  PROPHYLAXIS:  Perioperative antibiotics were given.  INDICATIONS:  The patient is a 71 year old female, who has a history of end-stage renal disease and is on chronic dialysis, who underwent left reverse shoulder replacement several years ago.  She did well for her first several years and then had a fall approximately 6 months ago, suffering a periprosthetic humerus fracture.  An attempt was made to manage her conservatively with coaptation splint followed by use of a Sarmiento fracture brace.  After 6 months of nonoperative treatment, the patient developed a nonunion with paradoxical motion at her mid arm. Due to ongoing pain and essentially a functionless left shoulder and arm due to her established nonunion, we discussed options for management including potential ORIF of her humerus fracture.  The patient did elect to proceed with surgery.  She received medical clearance  prior to surgery and presents now for operative treatment.  Informed consent obtained.  DESCRIPTION OF PROCEDURE:  After an adequate level of anesthesia was achieved, the patient was positioned in a modified beach-chair position. Left shoulder was correctly identified and sterilely prepped and draped in usual manner.  Time-out called.  We utilized the patient's prior proximal deltopectoral incision and extended down to an anterolateral approach to the humerus.  Dissection down through the subcutaneous tissues using the needle-tip Bovie, identified neurovascular structures and protected those along the way.  We first went distally to the radial nerve and located that as it exited the posterior arm and came alongside the lateral humerus.  Once we had that nerve protected and dissected and Dr. Dominica SeverinWilliam Gramig was scrubbed during the entire procedure and necessary for adequate distal exposure of the humerus and also for holding appropriate reduction of the fracture while placement of plate and cables and screws, so both Dr. Ranell PatrickNorris and Dr. Amanda PeaGramig were scrubbed during the entire surgery.  Once we had the nerve protected, then we took down the nonunion.  There was an established pseudocapsule present and the pseudoarthrosis was taken down.  We opened up the distal intramedullary canal.  We made a decision based on there being some exposed stem with the distal fracture fragment to go ahead and shorten the humerus to  take advantage of the stability of the stem at the fracture sites.  We did go ahead and shorten the proximal humerus by approximately 1 cm to expose 1 cm of the humeral stem and then also freshened up our distal fragment to get to good bleeding bone and to make sure that we had opposing ends that were flushed.  Once we had the stem into the distal fragment, we impacted that to make sure that we had it stabilized.  We placed an appropriate LC-DC plate from [employer].  This was a  stainless steel plate on the lateral humerus.  We had the rotation correct based on the local anatomy and the plate fit perfectly.  We were able to place clamps proximally and distally to hold our reduction and to get x-rays, verifying correct alignment and reduction.  We then went ahead and started with cables proximally.  We had thread in guides for the cables that could be locked into the proximal plate.  We did 4 cables proximally and then 5 screws distally, the first nonlock to pull the plate to the bone and then we had 1 screw pull out, which was a nonlock screw.  Thus, we used a cable inset into that screw hole.  We then did lock screws in the remainder of the screw holes and had a total of 5 lock screws, 1 nonlock and the cable distally, so we had excellent purchase distally.  We had some compression as well.  We placed 1 of the screws in compression to get compression of the fracture site and then 4 cables proximally that we had excellent purchase with.  The combination of the intramedullary fixation with the stem and then the cables proximally and the screws distally gave us a nice stable construct. Once we had everything secured, we peddled the bone for several centimeters proximal and distal to the fracture site.  We used available bone graft from our nonunion takedown and our shortening of the humerus in conjunction with Infuse BMP bone graft and we were able to provide excellent substrate for a successful union.  We then closed the deep layer of muscle directly overlying the hardware and the implant.  We made sure that the nerve was free and clear.  We irrigated a final time and then closed the deltopectoral layer proximally and subcutaneous layers distally with 2-0 Vicryl and then staples for skin.  Sterile compressive bandage and long-arm splint applied.  The patient awakened, taken to recovery room in stable condition.     [name],  M.D.   ______________________________ [name], M.D.    SRN/MEDQ  D:  02/20/2017  T:  02/20/2017  Job:  [id number]

## 2017-02-21 NOTE — Progress Notes (Signed)
Orthopedics Progress Note  Subjective: Patient comfortable this morning, asking for food  Objective:  Vitals:   02/21/17 0052 02/21/17 0452  BP: 115/63 (!) 98/57  Pulse: 71 88  Resp: 16 16  Temp: 97.8 F (36.6 C) 97.7 F (36.5 C)  SpO2: 100% 99%    General: Awake and alert  Musculoskeletal: Wiggles fingers left hand, min swelling Neurovascularly intact  Lab Results  Component Value Date   WBC 4.1 02/20/2017   HGB 9.2 (L) 02/21/2017   HCT 27.7 (L) 02/21/2017   MCV 100.0 02/20/2017   PLT 92 (L) 02/20/2017       Component Value Date/Time   NA 126 (L) 02/21/2017 0432   NA 137 08/20/2015   K 4.1 02/21/2017 0432   CL 94 (L) 02/21/2017 0432   CO2 22 02/21/2017 0432   GLUCOSE 115 (H) 02/21/2017 0432   BUN 14 02/21/2017 0432   BUN 15 08/20/2015   CREATININE 5.06 (H) 02/21/2017 0432   CALCIUM 8.0 (L) 02/21/2017 0432   GFRNONAA 8 (L) 02/21/2017 0432   GFRAA 9 (L) 02/21/2017 0432    Lab Results  Component Value Date   INR 1.39 02/20/2017   INR 1.30 06/17/2015   INR 1.31 06/14/2015    Assessment/Plan: POD #1 s/p Procedure(s): LEFT HUMERUS OPEN REDUCTION INTERNAL FIXATION Mobilization, pain control, d/c planning  Viviann SpareSteven R. Ranell PatrickNorris, MD 02/21/2017 7:35 AM

## 2017-02-21 NOTE — Plan of Care (Signed)
Patient is able to indicate pain and location.

## 2017-02-21 NOTE — Progress Notes (Signed)
PT Cancellation Note  Patient Details Name: Samantha [name] MRN: [id number] DOB: 1945/05/23   Cancelled Treatment:    Reason Eval/Treat Not Completed: Patient at procedure or test/unavailable Pt currently in HD. Will reattempt as schedule allows.   [name], PT, DPT  Acute Rehabilitation Services  Pager: [phone]    [name] 02/21/2017, 10:33 AM

## 2017-02-22 ENCOUNTER — Encounter (HOSPITAL_COMMUNITY): Payer: Self-pay | Admitting: Orthopedic Surgery

## 2017-02-22 NOTE — Evaluation (Signed)
Clinical/Bedside Swallow Evaluation Patient Details  Name: Pervis Hockingerlie M Weigel MRN: [id number] Date of Birth: 12/04/1945  Today's Date: 02/22/2017 Time: SLP Start Time (ACUTE ONLY): 1010 SLP Stop Time (ACUTE ONLY): 1027 SLP Time Calculation (min) (ACUTE ONLY): 17 min  Past Medical History:  Past Medical History:  Diagnosis Date  . Anemia   . Arthritis    left shoulder  . Cellulitis of left upper extremity   . Constipation   . Dementia   . Diabetes mellitus    borderline  . Dizziness   . Dry skin   . Gastric ulcer   . GERD (gastroesophageal reflux disease)   . GI bleed 06/06/2011  . Gram-negative bacteremia   . H/O: GI bleed   . Headache(784.0)    occasionally  . Hepatitis    Hep B  . History of blood transfusion   . History of kidney stones   . Hypertension   . Hypothyroidism    takes Synthroid daily  . Impaired hearing   . Joint pain   . Joint swelling   . Mechanical complication of other vascular device, implant, and graft 10/08/2013  . Occasional numbness/prickling/tingling of fingers and toes   . Oligouria   . Pancreatitis 04/10/2012  . Peripheral edema   . Renal disorder    m, w, F   . Secondary hyperparathyroidism (HCC) 08/19/2016  . Sepsis (HCC) 06/14/2015  . Shoulder pain   . Vaginal bleeding 08/15/2015   Past Surgical History:  Past Surgical History:  Procedure Laterality Date  . ARTERIOVENOUS GRAFT PLACEMENT Left    forearm  . cataract surgery     bilateral  . CHOLECYSTECTOMY  12/26/2011  . SHOULDER SURGERY  3-729yrs ago   right replacement   HPI:  71 year old female with h/o GERD, ESRD, dementia, admitted with Mid shaft left humerus fractures/p ORIF 11/6.    Assessment / Plan / Recommendation Clinical Impression  Bedside swallow evaluation complete. Although with severe dysphonia characterized by hoarse vocal quality, patient with intact oral transit and clearance of bolus, consistent swallow initiation, and no overt s/s of aspiration across po  consistencies. Unclear if dysphonia is baseline or secondary to pre-surgical traumatic intubation which could impact swallowing function acutely. Attempted to contact family for information without answer. Given seeingly intact airway protection during evaluation, will continued dysphagia 3 (endentualous and impulsive with intake per CNA), thin liquids with full supervision for use of safe swallowing precautions. SLP will f/u for tolerance and need for possible instrumental testing based on performance.  SLP Visit Diagnosis: Dysphagia, unspecified (R13.10)    Aspiration Risk  Mild aspiration risk    Diet Recommendation Dysphagia 3 (Mech soft);Thin liquid   Liquid Administration via: Cup;Straw Medication Administration: Whole meds with liquid Supervision: Patient able to self feed;Full supervision/cueing for compensatory strategies Compensations: Slow rate;Small sips/bites Postural Changes: Seated upright at 90 degrees    Other  Recommendations Oral Care Recommendations: Oral care BID   Follow up Recommendations (TBD)      Frequency and Duration min 2x/week  2 weeks       Prognosis Barriers to Reach Goals: Cognitive deficits      Swallow Study   General HPI: 71 year old female with h/o GERD, ESRD, dementia, admitted with Mid shaft left humerus fractures/p ORIF 11/6.  Type of Study: Bedside Swallow Evaluation Previous Swallow Assessment: none Diet Prior to this Study: Regular;Thin liquids Temperature Spikes Noted: No Respiratory Status: Room air History of Recent Intubation: Yes Length of Intubations (days): (surgery  only) Behavior/Cognition: Alert;Cooperative;Pleasant mood;Confused;Requires cueing Oral Cavity Assessment: Within Functional Limits Oral Care Completed by SLP: Recent completion by staff Oral Cavity - Dentition: Edentulous Vision: Functional for self-feeding Self-Feeding Abilities: Able to feed self Patient Positioning: Upright in chair Baseline Vocal Quality:  Hoarse(severe) Volitional Cough: Strong Volitional Swallow: Able to elicit    Oral/Motor/Sensory Function Overall Oral Motor/Sensory Function: Within functional limits   Ice Chips Ice chips: Within functional limits Presentation: Spoon   Thin Liquid Thin Liquid: Within functional limits Presentation: Cup;Straw    Nectar Thick Nectar Thick Liquid: Not tested   Honey Thick Honey Thick Liquid: Not tested   Puree Puree: Within functional limits Presentation: Spoon   Solid   GO   Solid: Within functional limits        Guinevere Stephenson Meryl 02/22/2017,10:47 AM

## 2017-02-22 NOTE — Progress Notes (Signed)
Patient ID: Samantha [name], female   DOB: 1946/03/08, 71 y.o.   MRN: [id number]  [facility] Progress Note   Assessment/ Plan:   1. Mid shaft left humerus fracture: POD #2 s/p ORIF. Struggling with pain control and undergoing evaluation to decide on disposition (home v/s SNF based on PT assessment).  2. ESRD - MWF schedule with no acute dialysis needs at this time. She appears euvolemic and does not have any critical labs noted. Order for HD tomorrow. 3. Secondary hyperparathyroidism - last PTH 288 with a Phos of 3. Not on VDRA/sensipar and takes CaCO3 for phosphorus binding.  4. Anemia - s/p transfusions and currently receiving another unit; usually on Mircera 200 (just increased with last 150 given on 10/31). 5. Hypothyroidism: on levothyroxine supplementation 6. DM  Subjective:   Reports continued left arm (surgical site) pain. Denies any shortness of breath.   Objective:   BP 120/62 (BP Location: Left Arm)   Pulse 88   Temp 97.6 F (36.4 C) (Oral)   Resp 18   Ht 5\' 4"  (1.626 m)   Wt 77.1 kg (169 lb 15.6 oz)   SpO2 100%   BMI 29.18 kg/m   Physical Exam: QIO:NGEXBMWGen:appears uncomfortable sitting in recliner UXL:KGMWNCVS:Pulse regular rhythm and normal rate Resp: clear bilaterally without rales/rhonchi UUV:OZDGAbd:soft, flat, NT Ext:No LE edema, LUA in wrap. RIJ TDC  Labs: BMET Recent Labs  Lab 02/16/17 0856 02/20/17 1428 02/20/17 1922 02/21/17 0432  NA 131* 126* 128* 126*  K 3.4* 3.5 3.8 4.1  CL 96* 92*  --  94*  CO2 24 23  --  22  GLUCOSE 81 72 70 115*  BUN 16 10  --  14  CREATININE 5.28* 4.40*  --  5.06*  CALCIUM 9.1 8.3*  --  8.0*   CBC Recent Labs  Lab 02/16/17 0856 02/20/17 1428 02/20/17 1922 02/21/17 0432  WBC 4.0 4.1  --   --   HGB 10.6* 8.5* 9.2* 9.2*  HCT 33.3* 26.7* 27.0* 27.7*  MCV 100.6* 100.0  --   --   PLT PLATELET CLUMPS NOTED ON SMEAR, COUNT APPEARS DECREASED 92*  --   --    Medications:    . levothyroxine  50 mcg Oral QAC breakfast  .  pantoprazole  40 mg Oral BID AC   [name], MD 02/22/2017, 8:54 AM

## 2017-02-22 NOTE — Progress Notes (Signed)
Spoke with patient's grandson. He states that patient is active w a [facility] [employer] company and he is to find out the name of it and call me back. He states that she has a RW, [place], and 3/1 at home.  Norval MortonMeadows,David Grandson [phone]  [phone]  Woodruff BlasWashington,Shamonica Grandaughter [phone]

## 2017-02-22 NOTE — Progress Notes (Signed)
OT Cancellation Note  Patient Details Name: Samantha [name] M Schear MRN: [id number] DOB: 1946/01/02   Cancelled Treatment:    Reason Eval/Treat Not Completed: Other (comment). Had scheduled an appt to meet grand daughter @ 4:15 for family education. Attempted @ 4:00. 4:30 and 5:00 but family was not present. Recommend HHOT and 24/7 S.  [facility]ology[facility],HILLARY  Gian Ybarra, OT/L  [phone] 02/22/2017 02/22/2017, 6:28 PM

## 2017-02-22 NOTE — Evaluation (Signed)
Physical Therapy Evaluation Patient Details Name: Samantha [name] M Alkhatib MRN: [id number] DOB: September 21, 1945 Today's Date: 02/22/2017   History of Present Illness  71 y.o. female ESRD MWF. h/o prior reverse total shoulder but with persistent left shoulder pain for many years. She was found to have a mid shaft humeral fracture. Underwent L humeral ORIF 02/20/17.   Clinical Impression  Pt admitted with above diagnosis. Pt currently with functional limitations due to the deficits listed below (see PT Problem List). At the time of PT eval pt was able to ambulate ~50 feet with +2 mod assist for balance support and safety. Communication was difficult as pt is HOH and speech was difficult to understand at times. Per chart review, it appears that pt's family is able to provide adequate assistance at d/c. Would prefer for HHPT/OT to follow up as well to maximize functional independence. Pt will benefit from skilled PT to increase their independence and safety with mobility to allow discharge to the venue listed below.       Follow Up Recommendations Home health PT;Supervision/Assistance - 24 hour    Equipment Recommendations  None recommended by PT    Recommendations for Other Services       Precautions / Restrictions Precautions Precautions: Shoulder Shoulder Interventions: Shoulder sling/immobilizer Precaution Booklet Issued: Yes (comment) Precaution Comments: assume no shoulder ROM; encourage finger ROM Required Braces or Orthoses: Other Brace/Splint(L posterior post op splint) Restrictions Weight Bearing Restrictions: Yes LUE Weight Bearing: Non weight bearing      Mobility  Bed Mobility Overal bed mobility: Needs Assistance Bed Mobility: Supine to Sit     Supine to sit: Mod assist     General bed mobility comments: Assist for trunk elevation. HHA provided for pt to pull on for support as she could not reach the R railing with the RUE. Bed pad used to assist pt into full sitting position at  EOB.   Transfers Overall transfer level: Needs assistance Equipment used: 1 person hand held assist Transfers: Sit to/from Stand Sit to Stand: Mod assist;+2 safety/equipment         General transfer comment: Mod assist provided through RUE for pt to power up to full standing position. Increased time required for pt to gain/maintain standing balance.   Ambulation/Gait Ambulation/Gait assistance: Mod assist;+2 physical assistance;+2 safety/equipment Ambulation Distance (Feet): 50 Feet Assistive device: 1 person hand held assist Gait Pattern/deviations: Step-through pattern;Decreased stride length;Trunk flexed Gait velocity: Decreased Gait velocity interpretation: Below normal speed for age/gender General Gait Details: +2 assist required for chair follow and occasional physical assist during ambulation. HHA provided through RUE, and pt significantly unsteady with L lateral lean. Feel pt slightly improved once in hall and had a straight path rather than attempting to make turns out of the room and into the hall.    Stairs            Wheelchair Mobility    Modified Rankin (Stroke Patients Only)       Balance Overall balance assessment: History of Falls(fall @ 6 months ago)                                           Pertinent Vitals/Pain Pain Assessment: Faces Faces Pain Scale: Hurts even more Pain Location: L shoulder Pain Descriptors / Indicators: Moaning;Grimacing Pain Intervention(s): Monitored during session;Limited activity within patient's tolerance;Repositioned;Premedicated before session    Home Living Family/patient expects  to be discharged to:: Private residence Living Arrangements: Other relatives Available Help at Discharge: Family Type of Home: Apartment Home Access: Level entry     Home Layout: One level Home Equipment: Cane - single point Additional Comments: Per OT note - Lives with grandson, who works during the day; family plans  to have assistance when family at work    Prior Function Level of Independence: Independent with assistive device(s)         Comments: pt uses a cane at all times; sponge baths and dresses herself; family assists with shower on Sundays     Hand Dominance   Dominant Hand: Right    Extremity/Trunk Assessment   Upper Extremity Assessment Upper Extremity Assessment: Defer to OT evaluation    Lower Extremity Assessment Lower Extremity Assessment: Generalized weakness    Cervical / Trunk Assessment Cervical / Trunk Assessment: Normal;Other exceptions Cervical / Trunk Exceptions: Forward head/rounded shoulder posture  Communication   Communication: HOH(Deaf R ear; hears better with L ear)  Cognition Arousal/Alertness: Awake/alert Behavior During Therapy: WFL for tasks assessed/performed Overall Cognitive Status: (Per OT note she is at baseline; confusion after dialysis)                                        General Comments      Exercises     Assessment/Plan    PT Assessment Patient needs continued PT services  PT Problem List Decreased strength;Decreased range of motion;Decreased balance;Decreased activity tolerance;Decreased mobility;Decreased knowledge of use of DME;Decreased safety awareness;Decreased knowledge of precautions;Pain       PT Treatment Interventions Gait training;DME instruction;Stair training;Therapeutic activities;Functional mobility training;Therapeutic exercise;Neuromuscular re-education;Patient/family education    PT Goals (Current goals can be found in the Care Plan section)  Acute Rehab PT Goals Patient Stated Goal: Pt did not state goals PT Goal Formulation: Patient unable to participate in goal setting Time For Goal Achievement: 03/01/17 Potential to Achieve Goals: Good    Frequency Min 4X/week   Barriers to discharge        Co-evaluation               AM-PAC PT "6 Clicks" Daily Activity  Outcome Measure  Difficulty turning over in bed (including adjusting bedclothes, sheets and blankets)?: Unable Difficulty moving from lying on back to sitting on the side of the bed? : Unable Difficulty sitting down on and standing up from a chair with arms (e.g., wheelchair, bedside commode, etc,.)?: A Lot Help needed moving to and from a bed to chair (including a wheelchair)?: A Lot Help needed walking in hospital room?: A Lot Help needed climbing 3-5 steps with a railing? : Total 6 Click Score: 9    End of Session Equipment Utilized During Treatment: Gait belt Activity Tolerance: Patient tolerated treatment well Patient left: in chair;with call bell/phone within reach;with chair alarm set;with nursing/sitter in room Nurse Communication: Mobility status PT Visit Diagnosis: Unsteadiness on feet (R26.81);History of falling (Z91.81);Pain Pain - Right/Left: Left Pain - part of body: Shoulder    Time: [id number] PT Time Calculation (min) (ACUTE ONLY): 21 min   Charges:   PT Evaluation $PT Eval Moderate Complexity: 1 Mod     PT G Codes:        Conni SlipperLaura Jerra Huckeby, PT, DPT Acute Rehabilitation Services Pager: [phone]   Marylynn PearsonLaura D Jennamarie Goings 02/22/2017, 11:33 AM

## 2017-02-22 NOTE — Progress Notes (Signed)
Orthopedics Progress Note  Subjective: Patient resting comfortably. Stable overnight per nursing.  Objective:  Vitals:   02/21/17 2020 02/22/17 0512  BP: 109/75 120/62  Pulse: 100 88  Resp: 18 18  Temp: 97.7 F (36.5 C) 97.6 F (36.4 C)  SpO2: 100% 100%    Musculoskeletal: Left shoulder dressing CDI, splint intact Hand warm and well perfused, no swelling in fingers Neurovascularly intact  Lab Results  Component Value Date   WBC 4.1 02/20/2017   HGB 9.2 (L) 02/21/2017   HCT 27.7 (L) 02/21/2017   MCV 100.0 02/20/2017   PLT 92 (L) 02/20/2017       Component Value Date/Time   NA 126 (L) 02/21/2017 0432   NA 137 08/20/2015   K 4.1 02/21/2017 0432   CL 94 (L) 02/21/2017 0432   CO2 22 02/21/2017 0432   GLUCOSE 115 (H) 02/21/2017 0432   BUN 14 02/21/2017 0432   BUN 15 08/20/2015   CREATININE 5.06 (H) 02/21/2017 0432   CALCIUM 8.0 (L) 02/21/2017 0432   GFRNONAA 8 (L) 02/21/2017 0432   GFRAA 9 (L) 02/21/2017 0432    Lab Results  Component Value Date   INR 1.39 02/20/2017   INR 1.30 06/17/2015   INR 1.31 06/14/2015    Assessment/Plan: POD #2 s/p Procedure(s): LEFT HUMERUS OPEN REDUCTION INTERNAL FIXATION PT, OT - will await PT consult to determine if home health PT/OT is feasible and safe, otherwise will plan short term SNF  Viviann SpareSteven R. Ranell PatrickNorris, MD 02/22/2017 7:41 AM

## 2017-02-23 ENCOUNTER — Inpatient Hospital Stay (HOSPITAL_COMMUNITY): Payer: Medicare Other

## 2017-02-23 LAB — RENAL FUNCTION PANEL
ALBUMIN: 2.1 g/dL — AB (ref 3.5–5.0)
ANION GAP: 10 (ref 5–15)
BUN: 14 mg/dL (ref 6–20)
CALCIUM: 8.3 mg/dL — AB (ref 8.9–10.3)
CO2: 24 mmol/L (ref 22–32)
Chloride: 91 mmol/L — ABNORMAL LOW (ref 101–111)
Creatinine, Ser: 4.68 mg/dL — ABNORMAL HIGH (ref 0.44–1.00)
GFR calc Af Amer: 10 mL/min — ABNORMAL LOW (ref >60)
GFR, EST NON AFRICAN AMERICAN: 8 mL/min — AB (ref >60)
Glucose, Bld: 93 mg/dL (ref 65–99)
POTASSIUM: 3.2 mmol/L — AB (ref 3.5–5.1)
Phosphorus: 4.3 mg/dL (ref 2.5–4.6)
Sodium: 125 mmol/L — ABNORMAL LOW (ref 135–145)

## 2017-02-23 LAB — CBC
HEMATOCRIT: 24.9 % — AB (ref 36.0–46.0)
Hemoglobin: 8.1 g/dL — ABNORMAL LOW (ref 12.0–15.0)
MCH: 30.8 pg (ref 26.0–34.0)
MCHC: 32.5 g/dL (ref 30.0–36.0)
MCV: 94.7 fL (ref 78.0–100.0)
Platelets: 100 10*3/uL — ABNORMAL LOW (ref 150–400)
RBC: 2.63 MIL/uL — ABNORMAL LOW (ref 3.87–5.11)
RDW: 20.6 % — AB (ref 11.5–15.5)
WBC: 7.2 10*3/uL (ref 4.0–10.5)

## 2017-02-23 MED ORDER — POLYETHYLENE GLYCOL 3350 17 G PO PACK: 17.0000 g | PACK | Freq: Every day | ORAL | Status: DC | PRN

## 2017-02-23 NOTE — Progress Notes (Signed)
Pt off unit in hemodialysis  

## 2017-02-23 NOTE — Progress Notes (Signed)
Pt received back from dialysis without any IV access.

## 2017-02-23 NOTE — Progress Notes (Signed)
  Speech Language Pathology Treatment: Dysphagia  Patient Details Name: Pervis Hockingerlie M Swinger MRN: [id number] DOB: Dec 25, 1945 Today's Date: 02/23/2017 Time: [id number] SLP Time Calculation (min) (ACUTE ONLY): 0 min  Assessment / Plan / Recommendation Clinical Impression  F/u after yesterday's swallowing assessment - pt had difficulty chewing dys 3 consistencies, so RN changed diet to dysphagia 1.   She is tolerating liquids without s/s of aspiration.  Voice remains quite hoarse; not certain of baseline vocal quality.  Afebrile, lungs clear bilaterally.  Continue dysphagia 1, thin liquids given adequate toleration.    HPI HPI: 71 year old female with h/o GERD, ESRD, dementia, admitted with Mid shaft left humerus fractures/p ORIF 11/6.       SLP Plan  Continue with current plan of care       Recommendations  Diet recommendations: Dysphagia 1 (puree);Thin liquid Liquids provided via: Cup;Straw Medication Administration: Whole meds with liquid Supervision: Staff to assist with self feeding Compensations: Slow rate;Small sips/bites Postural Changes and/or Swallow Maneuvers: Seated upright 90 degrees                Follow up Recommendations: None Plan: Continue with current plan of care       GO                Blenda MountsCouture, Rubie Ficco Laurice 02/23/2017, 3:17 PM

## 2017-02-23 NOTE — Progress Notes (Signed)
Orthopedics Progress Note  Subjective: Patient complaining of pain in the arm.  Objective:  Vitals:   02/23/17 1100 02/23/17 1130  BP: (!) 81/37 (!) 93/41  Pulse: 99 96  Resp:    Temp:    SpO2:      General: Awake and alert  Musculoskeletal: left shoulder dressing CDI Neurovascularly intact  Lab Results  Component Value Date   WBC 7.2 02/23/2017   HGB 8.1 (L) 02/23/2017   HCT 24.9 (L) 02/23/2017   MCV 94.7 02/23/2017   PLT 100 (L) 02/23/2017       Component Value Date/Time   NA 125 (L) 02/23/2017 0816   NA 137 08/20/2015   K 3.2 (L) 02/23/2017 0816   CL 91 (L) 02/23/2017 0816   CO2 24 02/23/2017 0816   GLUCOSE 93 02/23/2017 0816   BUN 14 02/23/2017 0816   BUN 15 08/20/2015   CREATININE 4.68 (H) 02/23/2017 0816   CALCIUM 8.3 (L) 02/23/2017 0816   GFRNONAA 8 (L) 02/23/2017 0816   GFRAA 10 (L) 02/23/2017 0816    Lab Results  Component Value Date   INR 1.39 02/20/2017   INR 1.30 06/17/2015   INR 1.31 06/14/2015    Assessment/Plan: POD #3 s/p Procedure(s): LEFT HUMERUS OPEN REDUCTION INTERNAL FIXATION Possible discharge to home with home health PT, OT, nonskilled aide - if cleared by therapy. Will get a portable xray of the arm to check the fracture  [name], MD 02/23/2017 1:23 PM

## 2017-02-23 NOTE — Procedures (Signed)
Patient seen on Hemodialysis. QB 400, UF goal 2.5L Treatment adjusted as needed.  Zetta BillsJay Alvina Strother MD [facility]. Office # [phone] Pager # [phone] 8:49 AM

## 2017-02-23 NOTE — Progress Notes (Signed)
Occupational Therapy Treatment Patient Details Name: Samantha [name] MRN: [id number] DOB: 09-27-1945 Today's Date: 02/23/2017    History of present illness 71 y.o. female ESRD MWF. h/o prior reverse total shoulder but with persistent left shoulder pain for many years. She was found to have a mid shaft humeral fracture. Underwent L humeral ORIF 02/20/17.    OT comments  Pt with limited progress toward OT goals due to pain and cognitive deficits. Pt required mod-max assist for bed mobility and repositioning this session. No family present today for ADL education. Updated d/c plan to SNF for follow up. Will continue to follow acutely.   Follow Up Recommendations  SNF;Supervision/Assistance - 24 hour    Equipment Recommendations  3 in 1 bedside commode    Recommendations for Other Services      Precautions / Restrictions Precautions Precautions: Shoulder Shoulder Interventions: Shoulder sling/immobilizer Precaution Booklet Issued: Yes (comment) Precaution Comments: assume no shoulder ROM; encourage finger ROM Required Braces or Orthoses: Other Brace/Splint(L posterior post op splint) Restrictions Weight Bearing Restrictions: Yes LUE Weight Bearing: Non weight bearing       Mobility Bed Mobility Overal bed mobility: Needs Assistance Bed Mobility: Supine to Sit;Sit to Supine     Supine to sit: Max assist Sit to supine: Mod assist   General bed mobility comments: Assist for LEs and trunk. Pt assisting with [facility] with use of RUE  Transfers                      Balance Overall balance assessment: Needs assistance;History of Falls Sitting-balance support: Feet supported;Single extremity supported Sitting balance-Leahy Scale: Fair                                     ADL either performed or assessed with clinical judgement   ADL Overall ADL's : Needs assistance/impaired                     Lower Body Dressing: Maximal  assistance Lower Body Dressing Details (indicate cue type and reason): for sling management               General ADL Comments: No family present for ADL education. Pt c/o significant pain; requesting repositioning in bed.     Vision       Perception     Praxis      Cognition Arousal/Alertness: Awake/alert Behavior During Therapy: WFL for tasks assessed/performed Overall Cognitive Status: No family/caregiver present to determine baseline cognitive functioning                                          Exercises Exercises: Other exercises Other Exercises Other Exercises: Encouraged LUE digit ROM.   Shoulder Instructions       General Comments      Pertinent Vitals/ Pain       Pain Assessment: Faces Faces Pain Scale: Hurts whole lot Pain Location: L shoulder Pain Descriptors / Indicators: Moaning;Grimacing Pain Intervention(s): Monitored during session;Limited activity within patient's tolerance;Repositioned  Home Living                                          Prior Functioning/Environment  Frequency  Min 3X/week        Progress Toward Goals  OT Goals(current goals can now be found in the care plan section)  Progress towards OT goals: Not progressing toward goals - comment(limited by pain)  Acute Rehab OT Goals Patient Stated Goal: decrease pain OT Goal Formulation: With patient  Plan Discharge plan needs to be updated    Co-evaluation                 AM-PAC PT "6 Clicks" Daily Activity     Outcome Measure   Help from another person eating meals?: A Lot Help from another person taking care of personal grooming?: A Lot Help from another person toileting, which includes using toliet, bedpan, or urinal?: A Lot Help from another person bathing (including washing, rinsing, drying)?: A Lot Help from another person to put on and taking off regular upper body clothing?: A Lot Help from another  person to put on and taking off regular lower body clothing?: A Lot 6 Click Score: 12    End of Session Equipment Utilized During Treatment: Other (comment)(sling)  OT Visit Diagnosis: Unsteadiness on feet (R26.81);Other abnormalities of gait and mobility (R26.89);Muscle weakness (generalized) (M62.81);Pain Pain - Right/Left: Left Pain - part of body: Shoulder   Activity Tolerance Patient limited by pain   Patient Left in bed;with call bell/phone within reach;with bed alarm set   Nurse Communication Mobility status;Other (comment)(d/c plan)        Time: 1610-9604: 1528-1542 OT Time Calculation (min): 14 min  Charges: OT General Charges $OT Visit: 1 Visit OT Treatments $Therapeutic Activity: 8-22 mins  Samantha Halbleib A. Brett [name], M.S., OTR/L Pager: [phone]   Gaye AlkenBailey A Desman Polak 02/23/2017, 4:32 PM

## 2017-02-23 NOTE — Progress Notes (Signed)
Patient ID: Samantha [name], female   DOB: 11-17-1945, 71 y.o.   MRN: [id number]  [facility] Progress Note   Assessment/ Plan:   1. Mid shaft left humerus fracture: POD #3 s/p ORIF. Improving pain control and per assessment from PT/OT appears that she is being set up for DC home with HHPT/HHOT.  2. ESRD - MWF schedule with ongoing dialysis at this time. She appears mildly hypervolemic and hyponatremic--discussed limiting fluid intake (suspect that her hering difficulty is a BIG barrier to adherence and understanding).  3. Secondary hyperparathyroidism - last PTH 288 with a Phos of 3. Not on VDRA/sensipar and takes CaCO3 for phosphorus binding.  4. Anemia - s/p transfusions and currently awaiting H/H done earlier today; usually on Mircera 200 (just increased with last 150 given on 10/31). 5. Hypothyroidism: on levothyroxine supplementation 6. DM  Subjective:   Reports continued left arm (surgical site) pain. Denies any shortness of breath.   Objective:   BP 117/66 (BP Location: Right Wrist)   Pulse 90   Temp 97.9 F (36.6 C) (Oral)   Resp 20   Ht 5\' 4"  (1.626 m)   Wt 77.1 kg (169 lb 15.6 oz)   SpO2 94%   BMI 29.18 kg/m   Physical Exam: NWG:NFAOZHYGen:appears comfortable resting in dialysis QMV:HQIONCVS:Pulse regular rhythm and normal rate Resp: clear bilaterally without rales/rhonchi, RIJ TDC GEX:BMWUAbd:soft, flat, NT Ext: Trace LE edema, LUA in wrap/sling. RIJ TDC  Labs: BMET Recent Labs  Lab 02/20/17 1428 02/20/17 1922 02/21/17 0432  NA 126* 128* 126*  K 3.5 3.8 4.1  CL 92*  --  94*  CO2 23  --  22  GLUCOSE 72 70 115*  BUN 10  --  14  CREATININE 4.40*  --  5.06*  CALCIUM 8.3*  --  8.0*   CBC Recent Labs  Lab 02/20/17 1428 02/20/17 1922 02/21/17 0432 02/23/17 0817  WBC 4.1  --   --  7.2  HGB 8.5* 9.2* 9.2* 8.1*  HCT 26.7* 27.0* 27.7* 24.9*  MCV 100.0  --   --  94.7  PLT 92*  --   --  100*   Medications:    . levothyroxine  50 mcg Oral QAC breakfast  .  pantoprazole  40 mg Oral BID AC   Zetta BillsJay Aubry Tucholski, MD 02/23/2017, 8:50 AM

## 2017-02-24 NOTE — Progress Notes (Signed)
Occupational Therapy Treatment Patient Details Name: Samantha [name] MRN: [id number] DOB: May 23, 1945 Today's Date: 02/24/2017    History of present illness 71 y.o. female ESRD MWF. h/o prior reverse total shoulder but with persistent left shoulder pain for many years. She was found to have a mid shaft humeral fracture. Underwent L humeral ORIF 02/20/17.    OT comments  ROM to L digits.  Pt. Guarded and required assistance to initiate movement of digits.  Will continue to follow acutely.   Follow Up Recommendations  SNF;Supervision/Assistance - 24 hour    Equipment Recommendations  3 in 1 bedside commode    Recommendations for Other Services      Precautions / Restrictions Precautions Precautions: Shoulder Shoulder Interventions: Shoulder sling/immobilizer Precaution Comments: assume no shoulder ROM; encourage finger ROM Required Braces or Orthoses: Other Brace/Splint Restrictions LUE Weight Bearing: Non weight bearing       Mobility Bed Mobility                  Transfers                      Balance                                           ADL either performed or assessed with clinical judgement   ADL                                               Vision       Perception     Praxis      Cognition Arousal/Alertness: Lethargic Behavior During Therapy: WFL for tasks assessed/performed Overall Cognitive Status: No family/caregiver present to determine baseline cognitive functioning                                          Exercises Other Exercises Other Exercises: Encouraged LUE digit ROM.   Shoulder Instructions       General Comments      Pertinent Vitals/ Pain       Pain Location: L shoulder Pain Descriptors / Indicators: Moaning;Grimacing  Home Living                                          Prior Functioning/Environment              Frequency  Min 3X/week        Progress Toward Goals  OT Goals(current goals can now be found in the care plan section)  Progress towards OT goals: Progressing toward goals     Plan Discharge plan needs to be updated    Co-evaluation                 AM-PAC PT "6 Clicks" Daily Activity     Outcome Measure   Help from another person eating meals?: A Lot Help from another person taking care of personal grooming?: A Lot Help from another person toileting, which includes using toliet, bedpan, or urinal?: A Lot Help from another person bathing (including washing,  rinsing, drying)?: A Lot Help from another person to put on and taking off regular upper body clothing?: A Lot Help from another person to put on and taking off regular lower body clothing?: A Lot 6 Click Score: 12    End of Session Equipment Utilized During Treatment: Other (comment)(sling)  OT Visit Diagnosis: Unsteadiness on feet (R26.81);Other abnormalities of gait and mobility (R26.89);Muscle weakness (generalized) (M62.81);Pain Pain - Right/Left: Left Pain - part of body: Shoulder   Activity Tolerance Patient limited by pain   Patient Left in bed;with call bell/phone within reach;with bed alarm set   Nurse Communication          Time: [id number] OT Time Calculation (min): 9 min  Charges: OT General Charges $OT Visit: 1 Visit OT Treatments $Self Care/Home Management : 8-22 mins   Robet LeuMorris, Mariko Nowakowski Lorraine, COTA/L 02/24/2017, 10:28 AM

## 2017-02-24 NOTE — Progress Notes (Signed)
Patient ID: Pervis Hockingerlie M Macapagal, female   DOB: August 09, 1945, 71 y.o.   MRN: [id number]  [facility] Progress Note   Assessment/ Plan:   1. Mid shaft left humerus fracture: POD #4 s/p ORIF. Improving pain control and per assessment from PT/OT appears that she is now going to likely require SNF level care at DC.  2. ESRD - MWF schedule with no acute HD needs noted at this time, plan for next dialysis Monday. Stressed limiting IDWG.   3. Secondary hyperparathyroidism - last PTH 288 with a Phos of 3. Not on VDRA/sensipar and takes CaCO3 for phosphorus binding.  4. Anemia - s/p transfusions and currently awaiting H/H done earlier today; usually on Mircera 200 (just increased with last 150 given on 10/31). 5. Hypothyroidism: on levothyroxine supplementation 6. Nutrition: continue renal diet and ONS to improve wound healing and post-op recovery  Subjective:   Reports improving discomfort of left arm. Plans now noted for DC to SNF given the level of assistance that she will require.   Objective:   BP (!) (P) 83/47 (BP Location: Right Arm)   Pulse (!) (P) 101   Temp (P) 98.8 F (37.1 C) (Oral)   Resp 18   Ht 5\' 4"  (1.626 m)   Wt 79.7 kg (175 lb 11.3 oz)   SpO2 (P) 100%   BMI 30.16 kg/m   Physical Exam: Gen: appears comfortable resting in bed CVS: Pulse regular rhythm and normal rate Resp: clear bilaterally without rales/rhonchi, RIJ TDC Abd: soft, flat, NT Ext: Trace LE edema, LUA in wrap/sling. RIJ TDC  Labs: BMET Recent Labs  Lab 02/20/17 1428 02/20/17 1922 02/21/17 0432 02/23/17 0816  NA 126* 128* 126* 125*  K 3.5 3.8 4.1 3.2*  CL 92*  --  94* 91*  CO2 23  --  22 24  GLUCOSE 72 70 115* 93  BUN 10  --  14 14  CREATININE 4.40*  --  5.06* 4.68*  CALCIUM 8.3*  --  8.0* 8.3*  PHOS  --   --   --  4.3   CBC Recent Labs  Lab 02/20/17 1428 02/20/17 1922 02/21/17 0432 02/23/17 0817  WBC 4.1  --   --  7.2  HGB 8.5* 9.2* 9.2* 8.1*  HCT 26.7* 27.0* 27.7* 24.9*  MCV  100.0  --   --  94.7  PLT 92*  --   --  100*   Medications:    . levothyroxine  50 mcg Oral QAC breakfast  . pantoprazole  40 mg Oral BID AC   Zetta BillsJay Sivan Quast, MD 02/24/2017, 9:06 AM

## 2017-02-24 NOTE — Progress Notes (Signed)
Subjective: 4 Days Post-Op Procedure(s) (LRB): LEFT HUMERUS OPEN REDUCTION INTERNAL FIXATION (Left) Patient reports pain as mild.  Resting comfortable in bed. Left splint in place. No complaints.  Objective: Vital signs in last 24 hours: Temp:  [97.9 F (36.6 C)-98.8 F (37.1 C)] (P) 98.8 F (37.1 C) (11/10 0551) Pulse Rate:  [96-102] (P) 101 (11/10 0551) Resp:  [18] 18 (11/09 1500) BP: (81-116)/(37-53) (P) 83/47 (11/10 0551) SpO2:  [100 %] (P) 100 % (11/10 0551)  Intake/Output from previous day: 11/09 0701 - 11/10 0700 In: 730 [P.O.:730] Out: -  Intake/Output this shift: Total I/O In: 240 [P.O.:240] Out: -   Recent Labs    02/23/17 0817  HGB 8.1*   Recent Labs    02/23/17 0817  WBC 7.2  RBC 2.63*  HCT 24.9*  PLT 100*   Recent Labs    02/23/17 0816  NA 125*  K 3.2*  CL 91*  CO2 24  BUN 14  CREATININE 4.68*  GLUCOSE 93  CALCIUM 8.3*   No results for input(s): LABPT, INR in the last 72 hours.   L arm splint C/D/I. No DVT signs. No signs of infection or compartment syndrome. LLE grossly neurovascularly intact. Sensation intact to touch. Good motion of her fingers on command.    Assessment/Plan: 4 Days Post-Op Procedure(s) (LRB): LEFT HUMERUS OPEN REDUCTION INTERNAL FIXATION (Left) N/V checks Splint care Continue other current care D/c planning  Karryn Kosinski L 02/24/2017, 9:17 AM

## 2017-02-24 NOTE — Progress Notes (Signed)
Received VM from grandson who states [facility] [employer] [facility] [employer].

## 2017-02-25 MED ORDER — DARBEPOETIN ALFA 60 MCG/0.3ML IJ SOSY
60.0000 ug | PREFILLED_SYRINGE | INTRAMUSCULAR | Status: DC
  Administered 2017-02-26: 60 ug via INTRAVENOUS
  Filled 2017-02-25: qty 0.3

## 2017-02-25 NOTE — Progress Notes (Signed)
Patient ID: Samantha [name], female   DOB: 10-28-1945, 71 y.o.   MRN: [id number]  [name] Progress Note   Assessment/ Plan:   1. Mid shaft left humerus fracture: POD #5 s/p ORIF. Continues to struggle with pain control and per assessment from PT/OT appears that she is now going to likely require SNF level care at DC.  2. ESRD - MWF schedule with no acute HD needs noted at this time, plan for next dialysis tomorrow. Stressed limiting IDWG (with regards to her hyponatremia and ankle/dependent edema).   3. Secondary hyperparathyroidism - last PTH 288 with a Phos of 4.3. Not on VDRA/sensipar and takes CaCO3 for phosphorus binding.  4. Anemia - s/p transfusions and will check H/H with dialysis tomorrow; no overt losses at this time. 5. Hypothyroidism: on levothyroxine supplementation 6. Nutrition: continue renal diet and ONS to improve wound healing and post-op recovery  Subjective:   Awaiting placement to SNF given the level of care required for her at this time. Reports continued pain over surgical site and right side of neck (from splint sling)   Objective:   BP (!) 103/53 (BP Location: Right Arm)   Pulse 100   Temp 97.7 F (36.5 C) (Oral)   Resp 18   Ht 5\' 4"  (1.626 m)   Wt 79.7 kg (175 lb 11.3 oz)   SpO2 100%   BMI 30.16 kg/m   Physical Exam: Gen: appears comfortable resting in bed CVS: Pulse regular rhythm and normal rate Resp: coarse breath sounds bilaterally without rales/rhonchi, RIJ TDC Abd: soft, flat, NT, trace dependent edema Ext: Trace ankle edema, LUA in wrap/sling. RIJ TDC  Labs: BMET Recent Labs  Lab 02/20/17 1428 02/20/17 1922 02/21/17 0432 02/23/17 0816  NA 126* 128* 126* 125*  K 3.5 3.8 4.1 3.2*  CL 92*  --  94* 91*  CO2 23  --  22 24  GLUCOSE 72 70 115* 93  BUN 10  --  14 14  CREATININE 4.40*  --  5.06* 4.68*  CALCIUM 8.3*  --  8.0* 8.3*  PHOS  --   --   --  4.3   CBC Recent Labs  Lab 02/20/17 1428 02/20/17 1922 02/21/17 0432  02/23/17 0817  WBC 4.1  --   --  7.2  HGB 8.5* 9.2* 9.2* 8.1*  HCT 26.7* 27.0* 27.7* 24.9*  MCV 100.0  --   --  94.7  PLT 92*  --   --  100*   Medications:    . levothyroxine  50 mcg Oral QAC breakfast  . pantoprazole  40 mg Oral BID AC   Zetta BillsJay Dystany Duffy, MD 02/25/2017, 8:26 AM

## 2017-02-25 NOTE — Progress Notes (Signed)
Orthopedic Tech Progress Note Patient Details:  Samantha [name] Sep 06, 1945 [id number]  Ortho Devices Type of Ortho Device: Coapt Ortho Device/Splint Location: lue Ortho Device/Splint Interventions: Application   Samantha [name], Samantha [name] 02/25/2017, 12:21 PM As ordered by Dr. Ranell PatrickNorris

## 2017-02-25 NOTE — Progress Notes (Addendum)
   Subjective: 5 Days Post-Op Procedure(s) (LRB): LEFT HUMERUS OPEN REDUCTION INTERNAL FIXATION (Left)  Pt c/o moderate pain in the left shoulder/arm this morning Denies any new symptoms in the shoulder Patient reports pain as moderate.  Objective:   VITALS:   Vitals:   02/24/17 2137 02/25/17 0538  BP: (!) 103/44 (!) 103/53  Pulse: (!) 104 100  Resp: 16 18  Temp: 99.6 F (37.6 C) 97.7 F (36.5 C)  SpO2: 100% 100%    Left shoulder dressing and splint changed  Incision to anterior shoulder healing well nv intact distally No drainage or erythema Mild edema in the left hand  LABS Recent Labs    02/23/17 0817  HGB 8.1*  HCT 24.9*  WBC 7.2  PLT 100*    Recent Labs    02/23/17 0816  NA 125*  K 3.2*  BUN 14  CREATININE 4.68*  GLUCOSE 93     Assessment/Plan: 5 Days Post-Op Procedure(s) (LRB): LEFT HUMERUS OPEN REDUCTION INTERNAL FIXATION (Left) Splint and dressing changed today Pain management  Will continue to work on d/c planning  I agree with the above assessment and plan.  OT recommending SNF which I agree with.  I do not believe she is at a point where she can be home yet even with home health therapy.  XRAYs from 11/10 look great. No evidence of any hardware complications.  Verlee RossettiSteven R Ahmira Boisselle, MD    Alphonsa OverallBrad Dixon, MPAS, PA-C  02/25/2017, 12:19 PM

## 2017-02-26 LAB — RENAL FUNCTION PANEL
ALBUMIN: 1.9 g/dL — AB (ref 3.5–5.0)
Anion gap: 12 (ref 5–15)
BUN: 15 mg/dL (ref 6–20)
CO2: 23 mmol/L (ref 22–32)
CREATININE: 5.5 mg/dL — AB (ref 0.44–1.00)
Calcium: 8 mg/dL — ABNORMAL LOW (ref 8.9–10.3)
Chloride: 92 mmol/L — ABNORMAL LOW (ref 101–111)
GFR calc Af Amer: 8 mL/min — ABNORMAL LOW (ref >60)
GFR, EST NON AFRICAN AMERICAN: 7 mL/min — AB (ref >60)
Glucose, Bld: 95 mg/dL (ref 65–99)
PHOSPHORUS: 3.5 mg/dL (ref 2.5–4.6)
Potassium: 3.7 mmol/L (ref 3.5–5.1)
Sodium: 127 mmol/L — ABNORMAL LOW (ref 135–145)

## 2017-02-26 LAB — CBC
HCT: 24.8 % — ABNORMAL LOW (ref 36.0–46.0)
Hemoglobin: 7.9 g/dL — ABNORMAL LOW (ref 12.0–15.0)
MCH: 31.1 pg (ref 26.0–34.0)
MCHC: 31.9 g/dL (ref 30.0–36.0)
MCV: 97.6 fL (ref 78.0–100.0)
PLATELETS: 99 10*3/uL — AB (ref 150–400)
RBC: 2.54 MIL/uL — ABNORMAL LOW (ref 3.87–5.11)
RDW: 20.9 % — AB (ref 11.5–15.5)
WBC: 5 10*3/uL (ref 4.0–10.5)

## 2017-02-26 MED ORDER — DARBEPOETIN ALFA 60 MCG/0.3ML IJ SOSY
PREFILLED_SYRINGE | INTRAMUSCULAR | Status: AC
  Filled 2017-02-26: qty 0.3

## 2017-02-26 MED ORDER — MIDODRINE HCL 5 MG PO TABS: 10.0000 mg | ORAL_TABLET | ORAL | Status: DC | PRN

## 2017-02-26 MED ORDER — HEPARIN SODIUM (PORCINE) 1000 UNIT/ML DIALYSIS
40.0000 [IU]/kg | INTRAMUSCULAR | Status: DC | PRN
  Filled 2017-02-26: qty 4

## 2017-02-26 NOTE — Progress Notes (Signed)
Physical Therapy Cancellation Note   02/26/17 [id number]  PT Visit Information  Last PT Received On 02/26/17  Reason Eval/Treat Not Completed Patient at procedure or test/unavailable. Pt in HD. PT will check on pt later as time allows.    Erline LevineKellyn Stephano Arrants, PTA Pager: [phone]

## 2017-02-26 NOTE — Care Management Note (Signed)
Case Management Note  Patient Details  Name: Samantha [name] MRN: [id number] Date of Birth: 12-09-1945  Subjective/Objective:     Left Humerus ORIF                  Action/Plan: Discharge Planning: NCM contacted grandson, Onalee HuaDavid. PT/OT recommended SNF. Grandson states [facility] the [facility]. Will send referral to CSW for SNF placement.   PCP  Fleet ContrasAVBUERE, EDWIN MD    Expected Discharge Date:                Expected Discharge Plan:  Skilled Nursing Facility  In-House Referral:  Clinical Social Work  Discharge planning Services  CM Consult  Post Acute Care Choice:    Choice offered to:  NA  DME Arranged:  N/A DME Agency:  NA  HH Arranged:  NA HH Agency:  NA  Status of Service:  Completed, signed off  If discussed at Long Length of Stay Meetings, dates discussed:    Additional Comments:  [name], Isiaha Greenup Ellen, RN 02/26/2017, 4:56 PM

## 2017-02-26 NOTE — Progress Notes (Signed)
Orthopedics Progress Note  Subjective: Patient reports being cold.  She has some pain with the arm.  Objective:  Vitals:   02/26/17 1138 02/26/17 1500  BP: 114/60 (!) 102/48  Pulse: (!) 102 98  Resp:  16  Temp: 98.2 F (36.8 C) 98.5 F (36.9 C)  SpO2: 100% 99%    General: Awake and alert  Musculoskeletal: New splint in good repair, fingers warm and perfused,  Neurovascularly intact  Lab Results  Component Value Date   WBC 5.0 02/26/2017   HGB 7.9 (L) 02/26/2017   HCT 24.8 (L) 02/26/2017   MCV 97.6 02/26/2017   PLT 99 (L) 02/26/2017       Component Value Date/Time   NA 127 (L) 02/26/2017 0742   NA 137 08/20/2015   K 3.7 02/26/2017 0742   CL 92 (L) 02/26/2017 0742   CO2 23 02/26/2017 0742   GLUCOSE 95 02/26/2017 0742   BUN 15 02/26/2017 0742   BUN 15 08/20/2015   CREATININE 5.50 (H) 02/26/2017 0742   CALCIUM 8.0 (L) 02/26/2017 0742   GFRNONAA 7 (L) 02/26/2017 0742   GFRAA 8 (L) 02/26/2017 0742    Lab Results  Component Value Date   INR 1.39 02/20/2017   INR 1.30 06/17/2015   INR 1.31 06/14/2015    Assessment/Plan:  s/p Procedure(s): LEFT HUMERUS OPEN REDUCTION INTERNAL FIXATION of periprosthetic humerus fracture. PT, OT recommending SNF - I agree with that and will look forward to FL-2 on chart as soon as possible. Continue supportive care.  Family understands the plan and agrees. Will need outpatient follow up in a week after discharge.  [name], MD 02/26/2017 5:07 PM

## 2017-02-26 NOTE — Progress Notes (Signed)
Patient ID: Samantha [name], female   DOB: 05/18/1945, 71 y.o.   MRN: [id number]  HD Orders: MWF South 4hr  73kg  3k/2.25  Hep none  R IJ TDC -Mircera 200q2weeks (last dose of 150 on 10/31) -Last PTH 288   [facility] Progress Note   Assessment/ Plan:   1. Mid shaft left humerus fracture: POD #5 s/p ORIF. Continues to struggle with pain control and per assessment from PT/OT appears that she is now going to likely require SNF level care at [place].  2. ESRD - MWF HD. Plan HD today. Stressed limiting IDWG.   3. Vol excess - +9kg up today w ++dependent edema. Plan extra HD tomorrow if still here.  3. Secondary hyperparathyroidism - last PTH 288 with a Phos of 4.3. Not on VDRA/sensipar and takes CaCO3 for phosphorus binding.  4. Anemia - s/p transfusions and will check H/H with dialysis tomorrow; no overt losses at this time. 5. Hypothyroidism: on levothyroxine supplementation 6. Nutrition: continue renal diet and ONS to improve wound healing and post-op recovery 7. HTN - bp's soft, will start midodrine pre-HD temporary. Home norvasc on hold.   Subjective:   Awaiting placement to SNF given the level of care required for her at this time.    Objective:   BP (!) 94/25   Pulse (!) 107   Temp (!) 97.5 F (36.4 C) (Oral)   Resp 16   Ht 5\' 4"  (1.626 m)   Wt 82.2 kg (181 lb 3.5 oz)   SpO2 100%   BMI 31.11 kg/m   Physical Exam: Gen: appears comfortable resting in bed, on dialysis CVS: Pulse regular rhythm and normal rate Resp: coarse breath sounds bilaterally without rales/rhonchi, RIJ TDC Abd: soft, flat, NT, trace dependent edema Ext: bilat dependent 1-2 + hip edema, 1+ pretib edema, LUA in wrap/sling. RIJ TDC  Labs: BMET Recent Labs  Lab 02/20/17 1428 02/20/17 1922 02/21/17 0432 02/23/17 0816 02/26/17 0742  NA 126* 128* 126* 125* 127*  K 3.5 3.8 4.1 3.2* 3.7  CL 92*  --  94* 91* 92*  CO2 23  --  22 24 23   GLUCOSE 72 70 115* 93 95  BUN 10  --  14 14 15    CREATININE 4.40*  --  5.06* 4.68* 5.50*  CALCIUM 8.3*  --  8.0* 8.3* 8.0*  PHOS  --   --   --  4.3 3.5   CBC Recent Labs  Lab 02/20/17 1428 02/20/17 1922 02/21/17 0432 02/23/17 0817 02/26/17 0742  WBC 4.1  --   --  7.2 5.0  HGB 8.5* 9.2* 9.2* 8.1* 7.9*  HCT 26.7* 27.0* 27.7* 24.9* 24.8*  MCV 100.0  --   --  94.7 97.6  PLT 92*  --   --  100* 99*   Medications:    . darbepoetin (ARANESP) injection - DIALYSIS  60 mcg Intravenous Q Mon-HD  . levothyroxine  50 mcg Oral QAC breakfast  . pantoprazole  40 mg Oral BID AC

## 2017-02-26 NOTE — Progress Notes (Signed)
OT Cancellation Note  Patient Details Name: Samantha [name] MRN: [id number] DOB: 1945-10-10   Cancelled Treatment:    Reason Eval/Treat Not Completed: Patient at procedure or test/ unavailable; Pt currently at HD, will follow up as schedule allows.  Marcy SirenBreanna Antion Andres, OT Pager [phone] 02/26/2017   Samantha [name] 02/26/2017, 11:14 AM

## 2017-02-27 MED ORDER — RENA-VITE PO TABS: 1.0000 | ORAL_TABLET | Freq: Every day | ORAL | Status: DC

## 2017-02-27 MED ORDER — HEPARIN SODIUM (PORCINE) 1000 UNIT/ML DIALYSIS: 1000.0000 [IU] | INTRAMUSCULAR | Status: DC | PRN

## 2017-02-27 MED ORDER — SODIUM CHLORIDE 0.9 % IV SOLN: 100.0000 mL | INTRAVENOUS | Status: DC | PRN

## 2017-02-27 MED ORDER — LIDOCAINE-PRILOCAINE 2.5-2.5 % EX CREA: 1.0000 "application " | TOPICAL_CREAM | CUTANEOUS | Status: DC | PRN

## 2017-02-27 MED ORDER — PRO-STAT SUGAR FREE PO LIQD: 30.0000 mL | Freq: Two times a day (BID) | ORAL | Status: DC

## 2017-02-27 MED ORDER — LIDOCAINE HCL (PF) 1 % IJ SOLN: 5.0000 mL | INTRAMUSCULAR | Status: DC | PRN

## 2017-02-27 MED ORDER — PENTAFLUOROPROP-TETRAFLUOROETH EX AERO: 1.0000 "application " | INHALATION_SPRAY | CUTANEOUS | Status: DC | PRN

## 2017-02-27 MED ORDER — ALTEPLASE 2 MG IJ SOLR: 2.0000 mg | Freq: Once | INTRAMUSCULAR | Status: DC | PRN

## 2017-02-27 NOTE — NC FL2 (Signed)
[place] MEDICAID FL2 LEVEL OF CARE SCREENING TOOL     IDENTIFICATION  Patient Name: Samantha [name] Birthdate: Feb 22, 1946 Sex: female Admission Date (Current Location): 02/20/2017  [facility] [place]   Facility and Address:  [facility] [place], [address], [place], [place] [address]      Provider Number: [id number]  Attending Physician Name and Address:  Beverely LowNorris, Steve, MD  Relative Name and Phone Number:  [facility] [place], grandaughter/legal guardian, [phone]    Current Level of Care: Hospital Recommended Level of Care: Skilled Nursing Facility Prior Approval Number:    Date Approved/Denied:   PASRR Number: [id number]  Discharge Plan: SNF    Current Diagnoses: Patient Active Problem List   Diagnosis Date Noted  . S/P ORIF (open reduction internal fixation) fracture 02/20/2017  . 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 Secondary hyperparathyroidism (HCC) 08/19/2016  . Left supracondylar humerus fracture, closed, initial encounter 08/18/2016  . Adnexal mass   . ESRD on dialysis (HCC)   . 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 Thrombocytopenia (HCC) 12/25/2014  . 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 Cardiomegaly 12/25/2014  . Hyponatremia 04/12/2012  . SOB (shortness of breath) 04/08/2012  . Symptomatic cholelithiasis 11/16/2011  . Transaminitis 06/08/2011  . Hepatitis B antibody positive 06/08/2011  . ESRD on hemodialysis (HCC) 06/06/2011  . HTN (hypertension) 06/06/2011  . Shoulder pain 06/06/2011  . Anemia in chronic kidney disease (CKD) 06/06/2011    Orientation RESPIRATION BLADDER Height & Weight     Self  Normal Continent Weight: 174 lb 13.2 oz (79.3 kg) Height:  5\' 4"  (162.6 cm)  BEHAVIORAL SYMPTOMS/MOOD NEUROLOGICAL BOWEL NUTRITION STATUS      Continent Diet  AMBULATORY STATUS COMMUNICATION OF NEEDS Skin    Extensive Assist Verbally Surgical wounds                       Personal Care Assistance Level of Assistance  Bathing, Dressing, Feeding Bathing Assistance: Maximum assistance Feeding assistance: Limited assistance Dressing Assistance: Maximum assistance     Functional Limitations Info  Hearing Sight Info: Adequate Hearing Info: Impaired      SPECIAL CARE FACTORS FREQUENCY  PT (By licensed PT), OT (By licensed OT)     PT Frequency: 5x week OT Frequency: 5x week            Contractures      Additional Factors Info  Code Status, Allergies(Full Code) Code Status Info: Full Code Allergies Info: ASPIRIN, BANANA, CHOCOLATE, FISH-DERIVED PRODUCTS            Current Medications (02/27/2017):  This is the current hospital active medication list Current Facility-Administered Medications  Medication Dose Route Frequency Provider Last Rate Last Dose  . 0.9 %  sodium chloride infusion   Intravenous Continuous Beverely LowNorris, Steve, MD 20 mL/hr at 02/20/17 2214    . 0.9 %  sodium chloride infusion  100 mL Intravenous PRN Delano MetzSchertz, Robert, MD      . 0.9 %  sodium chloride infusion  100 mL Intravenous PRN Delano MetzSchertz, Robert, MD      . acetaminophen (TYLENOL) tablet 650 mg  650 mg Oral Q4H PRN Beverely LowNorris, Steve, MD   650 mg at 02/24/17 2206   Or  . acetaminophen (TYLENOL) suppository 650 mg  650 mg Rectal Q4H PRN Beverely LowNorris, Steve, MD      . alteplase (CATHFLO ACTIVASE) injection  2 mg  2 mg Intracatheter Once PRN Delano MetzSchertz, Robert, MD      . bisacodyl (DULCOLAX) suppository 10 mg  10 mg Rectal Daily PRN Beverely LowNorris, Steve, MD      . Darbepoetin Alfa (ARANESP) injection 60 mcg  60 mcg Intravenous Q Mon-HD Zetta BillsPatel, Jay, MD   60 mcg at 02/26/17 1139  . heparin injection 1,000 Units  1,000 Units Dialysis PRN Delano MetzSchertz, Robert, MD      . HYDROcodone-acetaminophen (NORCO/VICODIN) 5-325 MG per tablet 1 tablet  1 tablet Oral Q6H PRN Beverely LowNorris, Steve, MD   1 tablet at 02/25/17 2243  . levothyroxine (SYNTHROID,  LEVOTHROID) tablet 50 mcg  50 mcg Oral QAC breakfast Beverely LowNorris, Steve, MD   50 mcg at 02/26/17 1254  . lidocaine (PF) (XYLOCAINE) 1 % injection 5 mL  5 mL Intradermal PRN Delano MetzSchertz, Robert, MD      . lidocaine-prilocaine (EMLA) cream 1 application  1 application Topical PRN Delano MetzSchertz, Robert, MD      . menthol-cetylpyridinium (CEPACOL) lozenge 3 mg  1 lozenge Oral PRN Beverely LowNorris, Steve, MD       Or  . phenol (CHLORASEPTIC) mouth spray 1 spray  1 spray Mouth/Throat PRN Beverely LowNorris, Steve, MD      . methocarbamol (ROBAXIN) tablet 500 mg  500 mg Oral Q6H PRN Beverely LowNorris, Steve, MD   500 mg at 02/25/17 1620   Or  . methocarbamol (ROBAXIN) 500 mg in dextrose 5 % 50 mL IVPB  500 mg Intravenous Q6H PRN Beverely LowNorris, Steve, MD      . metoCLOPramide (REGLAN) tablet 5-10 mg  5-10 mg Oral Q8H PRN Beverely LowNorris, Steve, MD       Or  . metoCLOPramide (REGLAN) injection 5-10 mg  5-10 mg Intravenous Q8H PRN Beverely LowNorris, Steve, MD      . midodrine (PROAMATINE) tablet 10 mg  10 mg Oral Q dialysis Delano MetzSchertz, Robert, MD      . morphine 4 MG/ML injection 2-4 mg  2-4 mg Intravenous Q2H PRN Beverely LowNorris, Steve, MD   4 mg at 02/22/17 0830  . ondansetron (ZOFRAN) tablet 4 mg  4 mg Oral Q6H PRN Beverely LowNorris, Steve, MD       Or  . ondansetron [facility](ZOFRAN) injection 4 mg  4 mg Intravenous Q6H PRN Beverely LowNorris, Steve, MD      . pantoprazole (PROTONIX) EC tablet 40 mg  40 mg Oral BID Juliette AlcideAC Norris, Steve, MD   40 mg at 02/26/17 1254  . pentafluoroprop-tetrafluoroeth (GEBAUERS) aerosol 1 application  1 application Topical PRN Delano MetzSchertz, Robert, MD      . polyethylene glycol (MIRALAX / GLYCOLAX) packet 17 g  17 g Oral Daily PRN Beverely LowNorris, Steve, MD      . senna-docusate (Senokot-S) tablet 1 tablet  1 tablet Oral QHS PRN Beverely LowNorris, Steve, MD         Discharge Medications: Please see discharge summary for a list of discharge medications.  Relevant Imaging Results:  Relevant Lab Results:   Additional Information SS # [id number], Dialysis MWF  Doy HutchingIsabel H Kahari Critzer, LCSWA

## 2017-02-27 NOTE — [place] FL2 (Signed)
 MEDICAID FL2 LEVEL OF CARE SCREENING TOOL     IDENTIFICATION  Patient Name: Samantha [name] Birthdate: 10/05/1945 Sex: female Admission Date (Current Location): 02/20/2017  County and Medicaid Number:  [place]   Facility and Address:  [facility] [place], [address], [place], [place] [address]      Provider Number: [id number]  Attending Physician Name and Address:  Norris, Steve, MD  Relative Name and Phone Number:  Shamonica Washington, grandaughter/legal guardian, [phone]    Current Level of Care: Hospital Recommended Level of Care: Skilled Nursing Facility Prior Approval Number:    Date Approved/Denied:   PASRR Number: [id number]  Discharge Plan: SNF    Current Diagnoses: Patient Active Problem List   Diagnosis Date Noted  . S/P ORIF (open reduction internal fixation) fracture 02/20/2017  . 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 Secondary hyperparathyroidism (HCC) 08/19/2016  . Left supracondylar humerus fracture, closed, initial encounter 08/18/2016  . Adnexal mass   . ESRD on dialysis (HCC)   . 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 Thrombocytopenia (HCC) 12/25/2014  . 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 Cardiomegaly 12/25/2014  . Hyponatremia 04/12/2012  . SOB (shortness of breath) 04/08/2012  . Symptomatic cholelithiasis 11/16/2011  . Transaminitis 06/08/2011  . Hepatitis B antibody positive 06/08/2011  . ESRD on hemodialysis (HCC) 06/06/2011  . HTN (hypertension) 06/06/2011  . Shoulder pain 06/06/2011  . Anemia in chronic kidney disease (CKD) 06/06/2011    Orientation RESPIRATION BLADDER Height & Weight     Self  Normal Continent Weight: 174 lb 13.2 oz (79.3 kg) Height:  5' 4" (162.6 cm)  BEHAVIORAL SYMPTOMS/MOOD NEUROLOGICAL BOWEL NUTRITION STATUS      Continent Diet  AMBULATORY STATUS COMMUNICATION OF NEEDS Skin    Extensive Assist Verbally Surgical wounds                       Personal Care Assistance Level of Assistance  Bathing, Dressing, Feeding Bathing Assistance: Maximum assistance Feeding assistance: Limited assistance Dressing Assistance: Maximum assistance     Functional Limitations Info  Hearing Sight Info: Adequate Hearing Info: Impaired      SPECIAL CARE FACTORS FREQUENCY  PT (By licensed PT), OT (By licensed OT)     PT Frequency: 5x week OT Frequency: 5x week            Contractures      Additional Factors Info  Code Status, Allergies(Full Code) Code Status Info: Full Code Allergies Info: ASPIRIN, BANANA, CHOCOLATE, FISH-DERIVED PRODUCTS            Current Medications (02/27/2017):  This is the current hospital active medication list Current Facility-Administered Medications  Medication Dose Route Frequency Provider Last Rate Last Dose  . 0.9 %  sodium chloride infusion   Intravenous Continuous Norris, Steve, MD 20 mL/hr at 02/20/17 2214    . 0.9 %  sodium chloride infusion  100 mL Intravenous PRN Schertz, Robert, MD      . 0.9 %  sodium chloride infusion  100 mL Intravenous PRN Schertz, Robert, MD      . acetaminophen (TYLENOL) tablet 650 mg  650 mg Oral Q4H PRN Norris, Steve, MD   650 mg at 02/24/17 2206   Or  . acetaminophen (TYLENOL) suppository 650 mg  650 mg Rectal Q4H PRN Norris, Steve, MD      . alteplase (CATHFLO ACTIVASE) injection   2 mg  2 mg Intracatheter Once PRN Delano MetzSchertz, Robert, MD      . bisacodyl (DULCOLAX) suppository 10 mg  10 mg Rectal Daily PRN Beverely LowNorris, Steve, MD      . Darbepoetin Alfa (ARANESP) injection 60 mcg  60 mcg Intravenous Q Mon-HD Zetta BillsPatel, Jay, MD   60 mcg at 02/26/17 1139  . heparin injection 1,000 Units  1,000 Units Dialysis PRN Delano MetzSchertz, Robert, MD      . HYDROcodone-acetaminophen (NORCO/VICODIN) 5-325 MG per tablet 1 tablet  1 tablet Oral Q6H PRN Beverely LowNorris, Steve, MD   1 tablet at 02/25/17 2243  . levothyroxine (SYNTHROID,  LEVOTHROID) tablet 50 mcg  50 mcg Oral QAC breakfast Beverely LowNorris, Steve, MD   50 mcg at 02/26/17 1254  . lidocaine (PF) (XYLOCAINE) 1 % injection 5 mL  5 mL Intradermal PRN Delano MetzSchertz, Robert, MD      . lidocaine-prilocaine (EMLA) cream 1 application  1 application Topical PRN Delano MetzSchertz, Robert, MD      . menthol-cetylpyridinium (CEPACOL) lozenge 3 mg  1 lozenge Oral PRN Beverely LowNorris, Steve, MD       Or  . phenol (CHLORASEPTIC) mouth spray 1 spray  1 spray Mouth/Throat PRN Beverely LowNorris, Steve, MD      . methocarbamol (ROBAXIN) tablet 500 mg  500 mg Oral Q6H PRN Beverely LowNorris, Steve, MD   500 mg at 02/25/17 1620   Or  . methocarbamol (ROBAXIN) 500 mg in dextrose 5 % 50 mL IVPB  500 mg Intravenous Q6H PRN Beverely LowNorris, Steve, MD      . metoCLOPramide (REGLAN) tablet 5-10 mg  5-10 mg Oral Q8H PRN Beverely LowNorris, Steve, MD       Or  . metoCLOPramide (REGLAN) injection 5-10 mg  5-10 mg Intravenous Q8H PRN Beverely LowNorris, Steve, MD      . midodrine (PROAMATINE) tablet 10 mg  10 mg Oral Q dialysis Delano MetzSchertz, Robert, MD      . morphine 4 MG/ML injection 2-4 mg  2-4 mg Intravenous Q2H PRN Beverely LowNorris, Steve, MD   4 mg at 02/22/17 0830  . ondansetron (ZOFRAN) tablet 4 mg  4 mg Oral Q6H PRN Beverely LowNorris, Steve, MD       Or  . ondansetron [facility](ZOFRAN) injection 4 mg  4 mg Intravenous Q6H PRN Beverely LowNorris, Steve, MD      . pantoprazole (PROTONIX) EC tablet 40 mg  40 mg Oral BID Juliette AlcideAC Norris, Steve, MD   40 mg at 02/26/17 1254  . pentafluoroprop-tetrafluoroeth (GEBAUERS) aerosol 1 application  1 application Topical PRN Delano MetzSchertz, Robert, MD      . polyethylene glycol (MIRALAX / GLYCOLAX) packet 17 g  17 g Oral Daily PRN Beverely LowNorris, Steve, MD      . senna-docusate (Senokot-S) tablet 1 tablet  1 tablet Oral QHS PRN Beverely LowNorris, Steve, MD         Discharge Medications: Please see discharge summary for a list of discharge medications.  Relevant Imaging Results:  Relevant Lab Results:   Additional Information SS # [id number], Dialysis MWF  Doy HutchingIsabel H Marzella Miracle, LCSWA

## 2017-02-27 NOTE — Clinical Social Work Placement (Signed)
   CLINICAL SOCIAL WORK PLACEMENT  NOTE  Date:  02/27/2017  Patient Details  Name: Samantha [name] MRN: [id number] Date of Birth: 11-18-1945  Clinical Social Work is seeking post-discharge placement for this patient at the Skilled  Nursing Facility level of care (*CSW will initial, date and re-position this form in  chart as items are completed):      Patient/family provided with [facility] Clinical Social Work Department's list of facilities offering this level of care within the geographic area requested by the patient (or if unable, by the patient's family).  Yes   Patient/family informed of their freedom to choose among providers that offer the needed level of care, that participate in Medicare, Medicaid or managed care program needed by the patient, have an available bed and are willing to accept the patient.  Yes   Patient/family informed of [facility] ownership interest in [facility] and [facility], as well as of the fact that they are under no obligation to receive care at these facilities.  PASRR submitted to EDS on       PASRR number received on       Existing PASRR number confirmed on 02/27/17     FL2 transmitted to all facilities in geographic area requested by pt/family on 02/27/17     FL2 transmitted to all facilities within larger geographic area on       Patient informed that his/her managed care company has contracts with or will negotiate with certain facilities, including the following:        No   Patient/family informed of bed offers received.  [facility]     Physician recommends and patient chooses bed at      [facility] on 02/27/17.  Patient to be transferred to facility by [facility]     Patient family notified on 02/27/17 of transfer.  Name of family member notified:  granddaughter advised     PHYSICIAN Please prepare prescriptions     Additional Comment:     _______________________________________________ Tresa MoorePatricia V Ludean Duhart, LCSW 02/27/2017, 2:04 PM

## 2017-02-27 NOTE — Social Work (Addendum)
Clinical Social Worker facilitated patient discharge including contacting patient family and facility to confirm patient discharge plans.  Clinical information faxed to facility and family agreeable with plan.    CSW arranged ambulance transport via [facility] to [facility].    RN to call [phone] to give report prior to discharge. Pt going to Room 303A.  Clinical Social Worker will sign off for now as social work intervention is no longer needed. Please consult us again if new need arises.  Keene BreathPatricia Shalaunda Weatherholtz, LCSW Clinical Social Worker [phone]

## 2017-02-27 NOTE — Progress Notes (Signed)
Patient discharged to [facility] report called to nurse Tray. [facility] work [facility]. Family aware of discharge plan.  Collin Rengel, Kae HellerMiranda Lynn, RN

## 2017-02-27 NOTE — Discharge Summary (Signed)
Physician Discharge Summary    Patient ID: Samantha [name] MRN: [id number] DOB/AGE: [birth year] 73 y.o.  Admit date: 02/20/2017 Discharge date:  02/27/17  Procedures:  Procedure(s) (LRB): LEFT HUMERUS OPEN REDUCTION INTERNAL FIXATION (Left)  Attending Physician:  Dr. 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 Anemia   . Arthritis    left shoulder  . Cellulitis of left upper extremity   . Constipation   . Dementia   . Diabetes mellitus    borderline  . Dizziness   . Dry skin   . Gastric ulcer   . GERD (gastroesophageal reflux disease)   . GI bleed 06/06/2011  . Gram-negative bacteremia   . H/O: GI bleed   . Headache(784.0)    occasionally  . Hepatitis    Hep [name]  . History of blood transfusion   . History of kidney stones   . Hypertension   . Hypothyroidism    takes Synthroid daily  . Impaired hearing   . Joint pain   . Joint swelling   . Mechanical complication of other vascular device, implant, and graft 10/08/2013  . Occasional numbness/prickling/tingling of fingers and toes   . Oligouria   . Pancreatitis 04/10/2012  . Peripheral edema   . Renal disorder    m, w, F   . Secondary hyperparathyroidism (HCC) 08/19/2016  . Sepsis (HCC) 06/14/2015  . Shoulder pain   . Vaginal bleeding 08/15/2015     PCP: Samantha [name], Edwin, Samantha [name]   Discharged Condition: fair  Hospital Course:  Patient underwent the above stated procedure on 02/20/2017. Patient tolerated the procedure well and brought to the recovery room in good condition and subsequently to the floor. No complications during their hospital stay. She completed dialysis on her normal schedule. No complicating wound issues during the  hospital stay. Incision healing well and good early range of motion  Disposition: 01-Home or Self Care with follow up in 2 weeks  Medications: Current Facility-Administered Medications  Medication Dose Route Frequency Provider Last Rate Last Dose  . 0.9 %  sodium chloride infusion   Intravenous Continuous Samantha LowNorris, Steve, Samantha [name] 20 mL/hr at 02/20/17 2214    . 0.9 %  sodium chloride infusion  100 mL Intravenous PRN Samantha MetzSchertz, Robert, Samantha [name]      . 0.9 %  sodium chloride infusion  100 mL Intravenous PRN Samantha MetzSchertz, Robert, Samantha [name]      . acetaminophen (TYLENOL) tablet 650 mg  650 mg Oral Q4H PRN Samantha LowNorris, Steve, Samantha [name]   650 mg at 02/24/17 2206   Or  . acetaminophen (TYLENOL) suppository 650 mg  650 mg Rectal Q4H PRN Samantha LowNorris, Steve, Samantha [name]      . alteplase (CATHFLO ACTIVASE) injection 2 mg  2 mg Intracatheter Once PRN Samantha MetzSchertz, Robert, Samantha [name]      . bisacodyl (DULCOLAX) suppository 10 mg  10 mg Rectal Daily PRN Samantha LowNorris, Steve, Samantha [name]      . Darbepoetin Alfa (ARANESP) injection 60 mcg  60 mcg Intravenous Q Mon-HD Samantha BillsPatel, Jay, Samantha [name]   60 mcg at 02/26/17 1139  . feeding supplement (PRO-STAT SUGAR FREE 64) liquid 30 mL  30 mL Oral BID Samantha CornBrown, Samantha Harbison, Samantha [name]      . heparin injection 1,000 Units  1,000 Units Dialysis  PRN Samantha Metz, Samantha [name]      . HYDROcodone-acetaminophen (NORCO/VICODIN) 5-325 MG per tablet 1 tablet  1 tablet Oral Q6H PRN Samantha Low, Samantha [name]   1 tablet at 02/25/17 2243  . levothyroxine (SYNTHROID, LEVOTHROID) tablet 50 mcg  50 mcg Oral QAC breakfast Samantha Low, Samantha [name]   50 mcg at 02/26/17 1254  . lidocaine (PF) (XYLOCAINE) 1 % injection 5 mL  5 mL Intradermal PRN Samantha Metz, Samantha [name]      . lidocaine-prilocaine (EMLA) cream 1 application  1 application Topical PRN Samantha Metz, Samantha [name]      . menthol-cetylpyridinium (CEPACOL) lozenge 3 mg  1 lozenge Oral PRN Samantha Low, Samantha [name]       Or  . phenol (CHLORASEPTIC) mouth spray 1 spray  1 spray Mouth/Throat PRN Samantha Low, Samantha [name]      . methocarbamol (ROBAXIN) tablet 500 mg  500 mg Oral  Q6H PRN Samantha Low, Samantha [name]   500 mg at 02/25/17 1620   Or  . methocarbamol (ROBAXIN) 500 mg in dextrose 5 % 50 mL IVPB  500 mg Intravenous Q6H PRN Samantha Low, Samantha [name]      . metoCLOPramide (REGLAN) tablet 5-10 mg  5-10 mg Oral Q8H PRN Samantha Low, Samantha [name]       Or  . metoCLOPramide (REGLAN) injection 5-10 mg  5-10 mg Intravenous Q8H PRN Samantha Low, Samantha [name]      . midodrine (PROAMATINE) tablet 10 mg  10 mg Oral Q dialysis Samantha Metz, Samantha [name]      . morphine 4 MG/ML injection 2-4 mg  2-4 mg Intravenous Q2H PRN Samantha Low, Samantha [name]   4 mg at 02/22/17 0830  . multivitamin (RENA-VIT) tablet 1 tablet  1 tablet Oral QHS Samantha Corn, Samantha [name]      . ondansetron [facility]) tablet 4 mg  4 mg Oral Q6H PRN Samantha Low, Samantha [name]       Or  . ondansetron [facility]) injection 4 mg  4 mg Intravenous Q6H PRN Samantha Low, Samantha [name]      . pantoprazole (PROTONIX) EC tablet 40 mg  40 mg Oral BID Samantha Alcide, Samantha [name]   40 mg at 02/26/17 1254  . pentafluoroprop-tetrafluoroeth (GEBAUERS) aerosol 1 application  1 application Topical PRN Samantha Metz, Samantha [name]      . polyethylene glycol (MIRALAX / GLYCOLAX) packet 17 g  17 g Oral Daily PRN Samantha Low, Samantha [name]      . senna-docusate (Senokot-S) tablet 1 tablet  1 tablet Oral QHS PRN Samantha Low, Samantha [name]        Follow-up Information    Samantha Low, Samantha [name]. Call in 2 weeks.   Specialty:  Orthopedic Surgery Why:  [phone] Contact information: [address] [place] [address] [phone]           Discharge Instructions    Call Samantha [name] / Call 911   Complete by:  As directed    If you experience chest pain or shortness of breath, CALL 911 and be transported to the hospital emergency room.  If you develope a fever above 101 F, pus (white drainage) or increased drainage or redness at the wound, or calf pain, call your surgeon's office.   Constipation Prevention   Complete by:  As directed    Drink plenty of fluids.  Prune juice may be helpful.  You may use a stool  softener, such as Colace (over the counter) 100 mg twice a day.  Use MiraLax (over the counter) for constipation as needed.   Diet - [name]  sodium heart healthy   Complete by:  As directed    Increase activity slowly as tolerated   Complete by:  As directed       Allergies as of 02/27/2017      Reactions   Aspirin    Banana Other (See Comments)   Due to dialysis   Chocolate Other (See Comments)   Due to dialysis   Fish-derived Products Other (See Comments)   Due to gout      Medication List    STOP taking these medications   amLODipine 10 MG tablet Commonly known as:  NORVASC   lactulose 10 GM/15ML solution Commonly known as:  CHRONULAC   oxyCODONE 5 MG immediate release tablet Commonly known as:  ROXICODONE   oxyCODONE-acetaminophen 5-325 MG tablet Commonly known as:  PERCOCET     TAKE these medications   acetaminophen 650 MG CR tablet Commonly known as:  TYLENOL Take 650 mg by mouth every 8 (eight) hours as needed for pain.   HYDROcodone-acetaminophen 5-325 MG tablet Commonly known as:  NORCO/VICODIN Take 1 tablet by mouth every 6 (six) hours as needed for moderate pain. What changed:  Another medication with the same name was added. Make sure you understand how and when to take each.   HYDROcodone-acetaminophen 5-325 MG tablet Commonly known as:  NORCO Take 1 tablet every 6 (six) hours as needed by mouth for moderate pain. What changed:  You were already taking a medication with the same name, and this prescription was added. Make sure you understand how and when to take each.   levothyroxine 50 MCG tablet Commonly known as:  SYNTHROID, LEVOTHROID Take 50 mcg by mouth daily before breakfast.   pantoprazole 40 MG tablet Commonly known as:  PROTONIX Take 1 tablet (40 mg total) by mouth 2 (two) times daily before a meal. What changed:  when to take this   senna-docusate 8.6-50 MG tablet Commonly known as:  Senokot-S Take 1 tablet by mouth at bedtime as needed  for mild constipation. What changed:  when to take this       Signed: Thea Gisthomas [name] Aarya Quebedeaux 02/27/2017, 12:41 PM

## 2017-02-27 NOTE — Social Work (Signed)
CSW spoke with granddaughter/legal guardian, Samantha [name], via phone about SNF recommendation and obtain permission to begin the process of finding SNF placement. Granddaughter stated that pt was previously living with pt grandson, Samantha [name], but did not have 24 hr supervision. Granddaughter agrees with placement in SNF.  Granddaughter stated a preference for Samantha [name] as pt has previously received care there, and is close to granddaughter's home.  CSW initated SNF placement.   [name], LCSWA

## 2017-02-27 NOTE — Progress Notes (Signed)
Physical Therapy Treatment Patient Details Name: Samantha [name] M Haigh MRN: [id number] DOB: [birth year] Today's Date: 02/27/2017    History of Present Illness 71 y.o. female ESRD MWF. h/o prior reverse total shoulder but with persistent left shoulder pain for many years. She was found to have a mid shaft humeral fracture. Underwent L humeral ORIF 02/20/17.     PT Comments    Continuing work on functional mobility and activity tolerance;  Tired post HD, but willing to get up to chair; [facility]; Difficulty getting good fit in sling as pt had a lot of pain with repositioning, and was unable to bend her elbow well enough for good sling fit    Follow Up Recommendations  Home health PT;Supervision/Assistance - 24 hour     Equipment Recommendations  None recommended by PT    Recommendations for Other Services       Precautions / Restrictions Precautions Precautions: Shoulder;Fall Shoulder Interventions: Shoulder sling/immobilizer Precaution Comments: assume no shoulder ROM; encourage finger ROM Required Braces or Orthoses: Other Brace/Splint Other Brace/Splint: Sling Restrictions Weight Bearing Restrictions: Yes LUE Weight Bearing: Non weight bearing    Mobility  Bed Mobility Overal bed mobility: Needs Assistance Bed Mobility: Supine to Sit     Supine to sit: Mod assist     General bed mobility comments: Assist for LEs and trunk. Close guard to prevent fall as she was quite close to EOB getting up  Transfers Overall transfer level: Needs assistance Equipment used: 2 person hand held assist(and bil support at gait belt) Transfers: Sit to/from Stand Sit to Stand: Mod assist;+2 safety/equipment         General transfer comment: Mod assist provided through RUE for pt to power up to full standing position. Increased time required for pt to gain/maintain standing balance.   Ambulation/Gait Ambulation/Gait assistance: Mod assist;+2 physical  assistance;+2 safety/equipment Ambulation Distance (Feet): 6 Feet Assistive device: 1 person hand held assist(second person present for safety)   Gait velocity: Decreased   General Gait Details: Tired post HD, which limited ambulation   Stairs            Wheelchair Mobility    Modified Rankin (Stroke Patients Only)       Balance     Sitting balance-Leahy Scale: Fair                                      Cognition Arousal/Alertness: Awake/alert Behavior During Therapy: WFL for tasks assessed/performed Overall Cognitive Status: No family/caregiver present to determine baseline cognitive functioning                                        Exercises      General Comments        Pertinent Vitals/Pain Pain Assessment: Faces Faces Pain Scale: Hurts whole lot Pain Location: L shoulder Pain Descriptors / Indicators: Moaning;Grimacing Pain Intervention(s): Monitored during session;Repositioned;Patient requesting pain meds-RN notified    Home Living                      Prior Function            PT Goals (current goals can now be found in the care plan section) Acute Rehab PT Goals Patient Stated Goal: wantign to get to commode  PT Goal Formulation: Patient unable to participate in goal setting Time For Goal Achievement: 03/01/17 Potential to Achieve Goals: Good Progress towards PT goals: Progressing toward goals    Frequency    Min 4X/week      PT Plan Current plan remains appropriate    Co-evaluation              AM-PAC PT "6 Clicks" Daily Activity  Outcome Measure  Difficulty turning over in bed (including adjusting bedclothes, sheets and blankets)?: Unable Difficulty moving from lying on back to sitting on the side of the bed? : Unable Difficulty sitting down on and standing up from a chair with arms (e.g., wheelchair, bedside commode, etc,.)?: Unable Help needed moving to and from a bed to chair  (including a wheelchair)?: A Lot Help needed walking in hospital room?: A Lot Help needed climbing 3-5 steps with a railing? : Total 6 Click Score: 8    End of Session Equipment Utilized During Treatment: Gait belt(sling) Activity Tolerance: Patient tolerated treatment well Patient left: in chair;with call bell/phone within reach;with chair alarm set Nurse Communication: Mobility status PT Visit Diagnosis: Unsteadiness on feet (R26.81);History of falling (Z91.81);Pain Pain - Right/Left: Left Pain - part of body: Shoulder     Time: [id number] PT Time Calculation (min) (ACUTE ONLY): 25 min  Charges:  $Therapeutic Activity: 23-37 mins                    G Codes:       Van ClinesHolly Gerren Hoffmeier, PT  Acute Rehabilitation Services Pager [phone] Office [phone]    Levi AlandHolly H Cougar Imel 02/27/2017, 4:22 PM

## 2017-02-27 NOTE — Care Management Important Message (Signed)
Important Message  Patient Details  Name: Samantha [name] MRN: [id number] Date of Birth: 1946-04-09   Medicare Important Message Given:  Yes    [name], [name], RN 02/27/2017, 1:22 PM

## 2017-02-27 NOTE — Progress Notes (Signed)
PT Cancellation Note  Patient Details Name: Samantha [name] MRN: [id number] DOB: Nov 17, 1945   Cancelled Treatment:    Reason Eval/Treat Not Completed: Patient at procedure or test/unavailable. Pt currently in HD. Will check back as schedule allows to continue with PT POC.    Marylynn PearsonLaura D Helon Wisinski 02/27/2017, 10:38 AM   Conni SlipperLaura Jheri Mitter, PT, DPT Acute Rehabilitation Services Pager: [phone]

## 2017-02-27 NOTE — Progress Notes (Signed)
[facility] Progress Note   Subjective: Extremely HOH. No C/Os at present. Extra HD treatment today.   Objective Vitals:   02/27/17 0353 02/27/17 0734 02/27/17 0755 02/27/17 0800  BP: 123/65 111/75 111/67 (!) 128/46  Pulse: (!) 104  95 93  Resp: 18 18    Temp: 98.3 F (36.8 C) 98.4 F (36.9 C)    TempSrc: Oral Oral    SpO2: 100% 100%    Weight:  79.3 kg (174 lb 13.2 oz)    Height:       Physical Exam General: Pleasant NAD Heart: S1,S2, RRR Lungs: CTAB Abdomen: SNT Extremities: Fat ankles. No LE edema. No edema on hips.  Dialysis Access: RIJ TDC blood lines connected.    Additional Objective Labs: Basic Metabolic Panel: Recent Labs  Lab 02/21/17 0432 02/23/17 0816 02/26/17 0742  NA 126* 125* 127*  K 4.1 3.2* 3.7  CL 94* 91* 92*  CO2 22 24 23   GLUCOSE 115* 93 95  BUN 14 14 15   CREATININE 5.06* 4.68* 5.50*  CALCIUM 8.0* 8.3* 8.0*  PHOS  --  4.3 3.5   Liver Function Tests: Recent Labs  Lab 02/20/17 1428 02/23/17 0816 02/26/17 0742  AST 30  --   --   ALT 14  --   --   ALKPHOS 73  --   --   BILITOT 1.5*  --   --   PROT 7.6  --   --   ALBUMIN 2.2* 2.1* 1.9*   No results for input(s): LIPASE, AMYLASE in the last 168 hours. CBC: Recent Labs  Lab 02/20/17 1428  02/21/17 0432 02/23/17 0817 02/26/17 0742  WBC 4.1  --   --  7.2 5.0  HGB 8.5*   < > 9.2* 8.1* 7.9*  HCT 26.7*   < > 27.7* 24.9* 24.8*  MCV 100.0  --   --  94.7 97.6  PLT 92*  --   --  100* 99*   < > = values in this interval not displayed.   Blood Culture    Component Value Date/Time   SDES BLOOD RIGHT HAND 08/15/2015 0540   SPECREQUEST IN PEDIATRIC BOTTLE 1CC 08/15/2015 0540   CULT NO GROWTH 5 DAYS 08/15/2015 0540   REPTSTATUS 08/20/2015 FINAL 08/15/2015 0540    Cardiac Enzymes: No results for input(s): CKTOTAL, CKMB, CKMBINDEX, TROPONINI in the last 168 hours. CBG: Recent Labs  Lab 02/20/17 1505 02/20/17 2019  GLUCAP 72 88   Iron Studies: No results for  input(s): IRON, TIBC, TRANSFERRIN, FERRITIN in the last 72 hours. @lablastinr3 @ Studies/Results: No results found. Medications: . sodium chloride 20 mL/hr at 02/20/17 2214  . sodium chloride    . sodium chloride    . methocarbamol (ROBAXIN)  IV     . darbepoetin (ARANESP) injection - DIALYSIS  60 mcg Intravenous Q Mon-HD  . levothyroxine  50 mcg Oral QAC breakfast  . pantoprazole  40 mg Oral BID AC   [facility]  73[facility]  3k/2.25  Hep none  R IJ TDC -Mircera 200q2weeks (last dose of 150 on 10/31) -Last PTH 288  Assessment/Plan: 1. Mid shaft L. Humerus Fx: S/P ORIF periprosthetic humerous fx. Recommending SNF placement.  2. ESRD MWF Extra tx today for volume overload. K+ 3.7 3. Anemia -HGB 7.9 Has had 2 units PRBCS 02/20/17,  Aranesp 60 mcg IV 02/26/17. OP Fe/Tsat stable. Follow HGB.  4. Secondary hyperparathyroidism - No VDRA/sensipar. Calcium carb at HS. Phos 3.5 Ca 8.0 C Ca  9.7.  5. HTN/volume - UFG 3.5 Pre wt 79.3 kg. Na 125-127. Needs to adhere to fluid restrictions. Adjust EDW on DC. .  6. Nutrition - DYS 1 diet. PCM-Albumin 1.9. Prostat/renal vit.   Rita H. Brown NP-C 02/27/2017, 9:46 AM  [facility] [phone]  Pt seen, examined and agree w A/P as above.  Samantha Moselleob Antonieta Slaven MD [facility] pager [phone]   02/27/2017, 2:25 PM

## 2017-06-26 ENCOUNTER — Ambulatory Visit
Admission: RE | Admit: 2017-06-26 | Discharge: 2017-06-26 | Disposition: A | Discharge status: Home / Self Care | Payer: Medicare Other | Source: Ambulatory Visit | Attending: Nephrology | Admitting: Nephrology

## 2017-06-26 ENCOUNTER — Other Ambulatory Visit: Payer: Self-pay | Admitting: Nephrology

## 2017-06-26 DIAGNOSIS — J189 Pneumonia, unspecified organism: Secondary | ICD-10-CM

## 2017-07-08 ENCOUNTER — Emergency Department (HOSPITAL_COMMUNITY)
Admission: EM | Admit: 2017-07-08 | Discharge: 2017-07-08 | Discharge status: Against Medical Advice | Payer: Medicare Other | Attending: Emergency Medicine | Admitting: Emergency Medicine

## 2017-07-08 ENCOUNTER — Other Ambulatory Visit: Payer: Self-pay

## 2017-07-08 ENCOUNTER — Encounter (HOSPITAL_COMMUNITY): Payer: Self-pay

## 2017-07-08 DIAGNOSIS — R109 Unspecified abdominal pain: Secondary | ICD-10-CM | POA: Diagnosis present

## 2017-07-08 DIAGNOSIS — Z5321 Procedure and treatment not carried out due to patient leaving prior to being seen by health care provider: Secondary | ICD-10-CM | POA: Diagnosis not present

## 2017-07-08 LAB — COMPREHENSIVE METABOLIC PANEL
ALT: 14 U/L (ref 14–54)
AST: 32 U/L (ref 15–41)
Albumin: 2.3 g/dL — ABNORMAL LOW (ref 3.5–5.0)
Alkaline Phosphatase: 122 U/L (ref 38–126)
Anion gap: 12 (ref 5–15)
BUN: 20 mg/dL (ref 6–20)
CO2: 23 mmol/L (ref 22–32)
Calcium: 9 mg/dL (ref 8.9–10.3)
Chloride: 96 mmol/L — ABNORMAL LOW (ref 101–111)
Creatinine, Ser: 5.6 mg/dL — ABNORMAL HIGH (ref 0.44–1.00)
GFR calc Af Amer: 8 mL/min — ABNORMAL LOW (ref >60)
GFR calc non Af Amer: 7 mL/min — ABNORMAL LOW (ref >60)
Glucose, Bld: 90 mg/dL (ref 65–99)
Potassium: 3.7 mmol/L (ref 3.5–5.1)
Sodium: 131 mmol/L — ABNORMAL LOW (ref 135–145)
Total Bilirubin: 1.3 mg/dL — ABNORMAL HIGH (ref 0.3–1.2)
Total Protein: 7.9 g/dL (ref 6.5–8.1)

## 2017-07-08 LAB — CBC WITH DIFFERENTIAL/PLATELET
Basophils Absolute: 0 10*3/uL (ref 0.0–0.1)
Basophils Relative: 1 %
Eosinophils Absolute: 0 10*3/uL (ref 0.0–0.7)
Eosinophils Relative: 2 %
HCT: 33.7 % — ABNORMAL LOW (ref 36.0–46.0)
Hemoglobin: 10.7 g/dL — ABNORMAL LOW (ref 12.0–15.0)
Lymphocytes Relative: 35 %
Lymphs Abs: 0.9 10*3/uL (ref 0.7–4.0)
MCH: 30.6 pg (ref 26.0–34.0)
MCHC: 31.8 g/dL (ref 30.0–36.0)
MCV: 96.3 fL (ref 78.0–100.0)
Monocytes Absolute: 0.3 10*3/uL (ref 0.1–1.0)
Monocytes Relative: 11 %
Neutro Abs: 1.3 10*3/uL — ABNORMAL LOW (ref 1.7–7.7)
Neutrophils Relative %: 51 %
Platelets: 108 10*3/uL — ABNORMAL LOW (ref 150–400)
RBC: 3.5 MIL/uL — ABNORMAL LOW (ref 3.87–5.11)
RDW: 20.6 % — ABNORMAL HIGH (ref 11.5–15.5)
WBC: 2.5 10*3/uL — ABNORMAL LOW (ref 4.0–10.5)

## 2017-07-08 LAB — LIPASE, BLOOD: Lipase: 32 U/L (ref 11–51)

## 2017-07-08 NOTE — ED Triage Notes (Signed)
Patient here with ongoing chronic abdominal pain for 4 years. States that her MD has worked her up for same but pain severe today. Last dialysis friday

## 2017-07-08 NOTE — ED Provider Notes (Signed)
Patient placed in Quick Look pathway, seen and evaluated   Chief Complaint:   HPI:   [age] year old female presents today with abdominal pain.  Patients history is given by her granddaughter who is caretaker.  She notes a history of chronic abdominal pain going on approximately 4 years.  She has been seen numerous times both in the emergency room and as an outpatient for this with no acute findings.  She had her gallbladder removed.  She notes that her grandma has "draumatic's" and acts up when she tries to get her way.  She notes she has thrown herself from the chair on purpose.  She was upset that no one came to see her yesterday and started complaining of abdominal pain.  Family notes this is identical to previous episodes same location, normal bowel movements no fever no vomiting no concerning characteristics of today's episode.  ROS: abd pain  Physical Exam:   Gen: No distress  Neuro: Awake and Alert  Skin: Warm    Focused Exam: Abdomen soft nontender while distracted, abdomen tenderness right upper quadrant when patient is focused.   Initiation of care has begun. The patient has been counseled on the process, plan, and necessity for staying for the completion/evaluation, and the remainder of the medical screening examination    Eyvonne MechanicHedges, Bailee Metter, Cordelia Poche-C 07/08/17 1259    Alvira MondaySchlossman, Erin, MD 07/09/17 1406

## 2017-07-08 NOTE — ED Notes (Signed)
Pt called for room, no response. 

## 2017-07-08 NOTE — ED Notes (Signed)
Pt called for vitals reassessment, no response.  

## 2017-07-22 ENCOUNTER — Other Ambulatory Visit: Payer: Self-pay

## 2017-07-22 ENCOUNTER — Emergency Department (HOSPITAL_COMMUNITY): Payer: Medicare Other

## 2017-07-22 ENCOUNTER — Emergency Department (HOSPITAL_COMMUNITY)
Admission: EM | Admit: 2017-07-22 | Discharge: 2017-07-22 | Disposition: A | Discharge status: Home / Self Care | Payer: Medicare Other | Attending: Emergency Medicine | Admitting: Emergency Medicine

## 2017-07-22 ENCOUNTER — Encounter (HOSPITAL_COMMUNITY): Payer: Self-pay | Admitting: Emergency Medicine

## 2017-07-22 DIAGNOSIS — R1084 Generalized abdominal pain: Secondary | ICD-10-CM | POA: Diagnosis not present

## 2017-07-22 DIAGNOSIS — E1122 Type 2 diabetes mellitus with diabetic chronic kidney disease: Secondary | ICD-10-CM | POA: Diagnosis not present

## 2017-07-22 DIAGNOSIS — E039 Hypothyroidism, unspecified: Secondary | ICD-10-CM | POA: Insufficient documentation

## 2017-07-22 DIAGNOSIS — I120 Hypertensive chronic kidney disease with stage 5 chronic kidney disease or end stage renal disease: Secondary | ICD-10-CM | POA: Diagnosis not present

## 2017-07-22 DIAGNOSIS — Z79899 Other long term (current) drug therapy: Secondary | ICD-10-CM | POA: Diagnosis not present

## 2017-07-22 DIAGNOSIS — Z992 Dependence on renal dialysis: Secondary | ICD-10-CM | POA: Insufficient documentation

## 2017-07-22 DIAGNOSIS — F0390 Unspecified dementia without behavioral disturbance: Secondary | ICD-10-CM | POA: Insufficient documentation

## 2017-07-22 DIAGNOSIS — N186 End stage renal disease: Secondary | ICD-10-CM | POA: Diagnosis not present

## 2017-07-22 DIAGNOSIS — F17220 Nicotine dependence, chewing tobacco, uncomplicated: Secondary | ICD-10-CM | POA: Insufficient documentation

## 2017-07-22 LAB — COMPREHENSIVE METABOLIC PANEL
ALK PHOS: 118 U/L (ref 38–126)
ALT: 15 U/L (ref 14–54)
AST: 36 U/L (ref 15–41)
Albumin: 2.3 g/dL — ABNORMAL LOW (ref 3.5–5.0)
Anion gap: 16 — ABNORMAL HIGH (ref 5–15)
BILIRUBIN TOTAL: 1.7 mg/dL — AB (ref 0.3–1.2)
BUN: 31 mg/dL — AB (ref 6–20)
CALCIUM: 9.4 mg/dL (ref 8.9–10.3)
CHLORIDE: 96 mmol/L — AB (ref 101–111)
CO2: 23 mmol/L (ref 22–32)
CREATININE: 5.34 mg/dL — AB (ref 0.44–1.00)
GFR calc Af Amer: 8 mL/min — ABNORMAL LOW (ref >60)
GFR, EST NON AFRICAN AMERICAN: 7 mL/min — AB (ref >60)
Glucose, Bld: 74 mg/dL (ref 65–99)
Potassium: 4.2 mmol/L (ref 3.5–5.1)
Sodium: 135 mmol/L (ref 135–145)
TOTAL PROTEIN: 7.9 g/dL (ref 6.5–8.1)

## 2017-07-22 LAB — CBC
HCT: 35.9 % — ABNORMAL LOW (ref 36.0–46.0)
Hemoglobin: 11.3 g/dL — ABNORMAL LOW (ref 12.0–15.0)
MCH: 30.7 pg (ref 26.0–34.0)
MCHC: 31.5 g/dL (ref 30.0–36.0)
MCV: 97.6 fL (ref 78.0–100.0)
PLATELETS: 119 10*3/uL — AB (ref 150–400)
RBC: 3.68 MIL/uL — ABNORMAL LOW (ref 3.87–5.11)
RDW: 22.7 % — AB (ref 11.5–15.5)
WBC: 3.9 10*3/uL — AB (ref 4.0–10.5)

## 2017-07-22 LAB — LIPASE, BLOOD: Lipase: 34 U/L (ref 11–51)

## 2017-07-22 MED ORDER — SUCRALFATE 1 G PO TABS
1.0000 g | ORAL_TABLET | Freq: Once | ORAL | Status: AC
  Administered 2017-07-22: 1 g via ORAL
  Filled 2017-07-22: qty 1

## 2017-07-22 MED ORDER — IOPAMIDOL (ISOVUE-300) INJECTION 61%
INTRAVENOUS | Status: AC
  Filled 2017-07-22: qty 30

## 2017-07-22 MED ORDER — SUCRALFATE 1 GM/10ML PO SUSP: 1.0000 g | Freq: Two times a day (BID) | ORAL | 0 refills | Status: DC | PRN

## 2017-07-22 NOTE — ED Notes (Signed)
Pt stable and wheeled out for discharge by family, caregivers state understanding follow up.

## 2017-07-22 NOTE — ED Notes (Signed)
Unable to assess orientation, pt will not answer any questions, took pills without difficulty

## 2017-07-22 NOTE — Discharge Instructions (Signed)
The cause of your abdominal pain was not identified today. You had a CT scan performed noted multiple findings that will need to be followed up by your family doctor. Get rechecked immediately if you develop fevers, difficulty breathing or new concerning symptoms. You do have a cyst on your left ovary that will need to be rechecked by your family doctor. You also has some fluid on your lung. Be sure not to miss any dialysis sessions.

## 2017-07-22 NOTE — ED Triage Notes (Signed)
EMS stated, abdominal pain for longer than a week. Pt. Out of her Hydrocodone. Not been able to be ambulate and usually will.

## 2017-07-22 NOTE — ED Provider Notes (Signed)
[facility] EMERGENCY DEPARTMENT Provider Note   CSN: [id number] Arrival date & time: 07/22/17  1341     History   Chief Complaint Chief Complaint  Patient presents with  . Abdominal Pain    HPI Samantha [name] is a 72 y.o. female.  The history is provided by the patient. No language interpreter was used.  Abdominal Pain      Samantha [name] is a 72 y.o. female who presents to the Emergency Department complaining of abdominal pain. Level V caveat due to dementia. She presents to the emergency department for evaluation of abdominal pain. She has a history of chronic abdominal pain but reports worsening pain over the last several weeks. She has constant generalized pain but it is worse with meals as well as activity. She denies any fevers. She had vomiting a few weeks ago, not now. No reports of diarrhea. She has ESR D and is on hemodialysis Monday, Wednesday, Friday. Her last dialysis session was on Friday and she received a full session.  Past Medical History:  Diagnosis Date  . Anemia   . Arthritis    left shoulder  . Cellulitis of left upper extremity   . Constipation   . Dementia   . Diabetes mellitus    borderline  . Dizziness   . Dry skin   . Gastric ulcer   . GERD (gastroesophageal reflux disease)   . GI bleed 06/06/2011  . Gram-negative bacteremia   . H/O: GI bleed   . Headache(784.0)    occasionally  . Hepatitis    Hep B  . History of blood transfusion   . History of kidney stones   . Hypertension   . Hypothyroidism    takes Synthroid daily  . Impaired hearing   . Joint pain   . Joint swelling   . Mechanical complication of other vascular device, implant, and graft 10/08/2013  . Occasional numbness/prickling/tingling of fingers and toes   . Oligouria   . Pancreatitis 04/10/2012  . Peripheral edema   . Renal disorder    m, w, F   . Secondary hyperparathyroidism ([place]) 08/19/2016  . Sepsis ([place]) 06/14/2015  . Shoulder pain   . Vaginal  bleeding 08/15/2015    Patient Active Problem List   Diagnosis Date Noted  . S/P ORIF (open reduction internal fixation) fracture 02/20/2017  . 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 Secondary hyperparathyroidism ([place]) 08/19/2016  . Left supracondylar humerus fracture, closed, initial encounter 08/18/2016  . Adnexal mass   . ESRD on dialysis ([place])   . 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 Thrombocytopenia ([place]) 12/25/2014  . 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 Cardiomegaly 12/25/2014  . Hyponatremia 04/12/2012  . SOB (shortness of breath) 04/08/2012  . Symptomatic cholelithiasis 11/16/2011  . Transaminitis 06/08/2011  . Hepatitis B antibody positive 06/08/2011  . ESRD on hemodialysis ([place]) 06/06/2011  . HTN (hypertension) 06/06/2011  . Shoulder pain 06/06/2011  . Anemia in chronic kidney disease (CKD) 06/06/2011    Past Surgical History:  Procedure Laterality Date  . ARTERIOVENOUS GRAFT PLACEMENT Left    forearm  . cataract surgery     bilateral  . CHOLECYSTECTOMY  12/26/2011  . CHOLECYSTECTOMY  12/26/2011   Procedure: LAPAROSCOPIC CHOLECYSTECTOMY;  Surgeon: Gayland Curry, MD,FACS;  Location: [facility];  Service: General;  Laterality: N/A;  . ESOPHAGOGASTRODUODENOSCOPY  06/09/2011   Procedure: ESOPHAGOGASTRODUODENOSCOPY (EGD);  Surgeon:  Beryle Beams, MD;  Location: [facility];  Service: Endoscopy;  Laterality: N/A;  . I&D EXTREMITY Left 03/23/2015   Procedure: DRAINAGE OF LEFT ARM SEROMA;  Surgeon: Conrad Westway, MD;  Location: [facility];  Service: Vascular;  Laterality: Left;  . INSERTION OF DIALYSIS CATHETER Right 01/12/2015   Procedure: INSERTION OF DIALYSIS CATHETER;  Surgeon: Conrad Volga, MD;  Location: [facility];  Service: Vascular;  Laterality: Right;  . LIGATION ARTERIOVENOUS GORTEX GRAFT Left 03/23/2015   Procedure: LIGATION AND EXCISION OF LEFT ARTERIOVENOUS GORTEX GRAFT;  Surgeon: Conrad [place] Heights, MD;  Location: [facility];  Service: Vascular;  Laterality: Left;  . ORIF HUMERUS FRACTURE Left 02/20/2017   Procedure: LEFT HUMERUS OPEN REDUCTION INTERNAL FIXATION;  Surgeon: Netta Cedars, MD;  Location: [facility];  Service: Orthopedics;  Laterality: Left;  . REVERSE SHOULDER ARTHROPLASTY  09/22/2011   Procedure: REVERSE SHOULDER ARTHROPLASTY;  Surgeon: Augustin Schooling, MD;  Location: [facility];  Service: Orthopedics;  Laterality: Left;  left reverse shoulder arthroplasty  . REVISION OF ARTERIOVENOUS GORETEX GRAFT Left 01/12/2015   Procedure: REVISION OF Left FOREARM ARTERIOVENOUS GORETEX GRAFT;  Surgeon: Conrad Fate, MD;  Location: [facility];  Service: Vascular;  Laterality: Left;  . SHOULDER SURGERY  3-34yr ago   right replacement  . SHUNTOGRAM Left 08/29/2012   Procedure: SHUNTOGRAM;  Surgeon: BConrad Doney Park MD;  [facility];  Service: Cardiovascular;  Laterality: Left;  arm     OB History   None      Home Medications    Prior to Admission medications   Medication Sig Start Date End Date Taking? Authorizing Provider  acetaminophen (TYLENOL) 650 MG CR tablet Take 650 mg by mouth every 8 (eight) hours as needed for pain.    [provider]  HYDROcodone-acetaminophen (NORCO) 5-325 MG tablet Take 1 tablet every 6 (six) hours as needed by mouth for moderate pain. 02/20/17   NNetta Cedars MD  HYDROcodone-acetaminophen (NORCO/VICODIN) 5-325 MG tablet Take 1 tablet by mouth every 6 (six) hours as needed for moderate pain. 08/20/15   GCaren Griffins MD  levothyroxine (SYNTHROID, LEVOTHROID) 50 MCG tablet Take 50 mcg by mouth daily before breakfast.  04/15/15   [provider]  pantoprazole (PROTONIX) 40 MG tablet Take 1 tablet (40 mg total) by mouth 2 (two) times daily before a meal. Patient taking differently: Take 40 mg by mouth daily.  06/18/15   PLavina Hamman MD  senna-docusate (SENOKOT-S) 8.6-50 MG tablet Take 1 tablet by mouth at bedtime as needed for mild  constipation. Patient taking differently: Take 1 tablet by mouth daily.  08/20/16   Rama, CVenetia Maxon MD  sucralfate (CARAFATE) 1 GM/10ML suspension Take 10 mLs (1 g total) by mouth 2 (two) times daily as needed. 07/22/17   RQuintella Reichert MD    Family History Family History  Problem Relation Age of Onset  . Hypertension Mother   . Diabetes Mother   . Cancer Brother     Social History Social History   Tobacco Use  . Smoking status: Never Smoker  . Smokeless tobacco: Current User    Types: Chew  Substance Use Topics  . Alcohol use: No    Alcohol/week: 0.0 oz    Comment: quit 4-511yrago  . Drug use: No     Allergies   Aspirin; Banana; Chocolate; and Fish-derived products   Review of Systems Review of Systems  Gastrointestinal: Positive for abdominal pain.  All other systems reviewed  and are negative.    Physical Exam Updated Vital Signs BP (!) 158/96   Pulse 78   Temp 97.6 F (36.4 C) (Oral)   Resp 16   SpO2 100%   Physical Exam  Constitutional: She appears well-developed and well-nourished.  HENT:  Head: Normocephalic and atraumatic.  Cardiovascular: Normal rate and regular rhythm.  No murmur heard. Pulmonary/Chest: Effort normal and breath sounds normal. No respiratory distress.  Vascular catheter and right anterior chest, dressing in place  Abdominal: Soft. There is no rebound and no guarding.  Mild to moderate generalized abdominal tenderness without any guarding or rebound  Musculoskeletal: She exhibits no tenderness.  None pitting edema to all four extremities  Neurological: She is alert.  Mildly confused hard of hearing and slow to answer questions. Disoriented to time. Oriented person in place. Five out of five strength in all four extremities.  Skin: Skin is warm and dry.  Psychiatric: She has a normal mood and affect. Her behavior is normal.  Nursing note and vitals reviewed.    ED Treatments / Results  Labs (all labs ordered are listed, but  only abnormal results are displayed) Labs Reviewed  COMPREHENSIVE METABOLIC PANEL - Abnormal; Notable for the following components:      Result Value   Chloride 96 (*)    BUN 31 (*)    Creatinine, Ser 5.34 (*)    Albumin 2.3 (*)    Total Bilirubin 1.7 (*)    GFR calc non Af Amer 7 (*)    GFR calc Af Amer 8 (*)    Anion gap 16 (*)    All other components within normal limits  CBC - Abnormal; Notable for the following components:   WBC 3.9 (*)    RBC 3.68 (*)    Hemoglobin 11.3 (*)    HCT 35.9 (*)    RDW 22.7 (*)    Platelets 119 (*)    All other components within normal limits  LIPASE, BLOOD  URINALYSIS, ROUTINE W REFLEX MICROSCOPIC    EKG None  Radiology Ct Abdomen Pelvis Wo Contrast  Result Date: 07/22/2017 CLINICAL DATA:  Abdominal pain. EXAM: CT ABDOMEN AND PELVIS WITHOUT CONTRAST TECHNIQUE: Multidetector CT imaging of the abdomen and pelvis was performed following the standard protocol without IV contrast. COMPARISON:  01/20/2017 FINDINGS: Lower chest: Right lower lobe collapse/consolidation noted with small right pleural effusion. Visualized heart appears enlarged. Hepatobiliary: Nodular hepatic contour compatible with cirrhosis. Gallbladder surgically absent. No intrahepatic or extrahepatic biliary dilation. Pancreas: No focal mass lesion. No dilatation of the main duct. No intraparenchymal cyst. No peripancreatic edema. Spleen: No splenomegaly. No focal mass lesion. Adrenals/Urinary Tract: Right adrenal gland unremarkable. Similar appearance 11 mm left adrenal nodule. Kidneys are atrophic. Stable 10 mm hypoattenuating lesion in the left kidney. Bladder is decompressed. Stomach/Bowel: Stomach is nondistended. No evidence of outlet obstruction. Possible mild fold thickening in the gastric wall. Duodenum is normally positioned as is the ligament of Treitz. No small bowel wall thickening. No small bowel dilatation. The terminal ileum is normal. The appendix is normal. No gross  colonic mass. No colonic wall thickening. No substantial diverticular change. Vascular/Lymphatic: There is abdominal aortic atherosclerosis without aneurysm. Vascular collateralization noted in the left upper quadrant. There is no gastrohepatic or hepatoduodenal ligament lymphadenopathy. No intraperitoneal or retroperitoneal lymphadenopathy. No pelvic sidewall lymphadenopathy. Reproductive: Uterus unremarkable. 3.3 cm cystic lesion in the left adnexal space has progressed in the interval, measuring 3.9 cm today. Other: No intraperitoneal free fluid. Musculoskeletal: Diffuse body  wall edema evident. Similar appearance of markedly heterogeneous bone mineralization. Destructive endplate changes around the L2-3 interspace are stable and may reflect sequelae of remote discitis/osteomyelitis. Compression fracture at L1 has progressed in the interval with focal kyphosis at the T12-L1 interspace. IMPRESSION: 1. Interval development of right lower lobe collapse/consolidation with small right pleural effusion. 2. Cirrhotic liver morphology with left upper quadrant vascular collateralization suggesting portal venous hypertension. 3. Question mild gastric fold thickening. This could be related to underlying systemic disease from cirrhosis, but gastritis is a consideration. 4. Interval progression of left adnexal cystic mass now measuring 3.9 cm. 5. Diffuse body wall edema. 6. Bilateral renal atrophy with stable low-density left hypoattenuating renal lesion. 7. Further compression of the L1 fracture with focal kyphosis now seen at T12-L1. Similar appearance of chronic endplate destruction at L2-3 potentially from remote discitis/osteomyelitis. 8.  Aortic Atherosclerois (ICD10-170.0) Electronically Signed   By: Misty Stanley M.D.   On: 07/22/2017 21:50    Procedures Procedures (including critical care time)  Medications Ordered in ED Medications  iopamidol (ISOVUE-300) 61 % injection (has no administration in time range)    sucralfate (CARAFATE) tablet 1 g (1 g Oral Given 07/22/17 1849)     Initial Impression / Assessment and Plan / ED Course  I have reviewed the triage vital signs and the nursing notes.  Pertinent labs & imaging results that were available during my care of the patient were reviewed by me and considered in my medical decision making (see chart for details).     Patient with history of ESR D on hemodialysis here for evaluation of generalized abdominal pain. Contacted patient's grandson and power of attorney regarding additional history elements. He does state chronic abdominal pain with multiple visits to different providers. He states over the last several days that her pain has worsened. Patient does have mild diffuse tenderness on examination with no peritoneal findings. CT scan demonstrates multiple incidental findings. There is no ovarian cyst with no significant change compared to one per year prior - presentation is not consistent with ovarian torsion or abscess. There is gastric wall thickening, question some element of gastritis - will treat with Carafate, continue Pepcid. There is evidence of a raised pleural effusion with atelectasis. Current presentation is not consistent with pneumonia as she has clear lungs are no respiratory complaints. Discussed importance of outpatient follow-up as well as return precautions.  Final Clinical Impressions(s) / ED Diagnoses   Final diagnoses:  Generalized abdominal pain    ED Discharge Orders        Ordered    sucralfate (CARAFATE) 1 GM/10ML suspension  2 times daily PRN     07/22/17 2218       Quintella Reichert, MD 07/22/17 2229

## 2017-08-02 ENCOUNTER — Other Ambulatory Visit: Payer: Self-pay | Admitting: Gastroenterology

## 2017-08-15 ENCOUNTER — Emergency Department (HOSPITAL_COMMUNITY)
Admission: EM | Admit: 2017-08-15 | Discharge: 2017-08-15 | Disposition: A | Discharge status: Home / Self Care | Payer: Medicare Other | Attending: Emergency Medicine | Admitting: Emergency Medicine

## 2017-08-15 ENCOUNTER — Encounter (HOSPITAL_COMMUNITY): Payer: Self-pay | Admitting: *Deleted

## 2017-08-15 DIAGNOSIS — F17220 Nicotine dependence, chewing tobacco, uncomplicated: Secondary | ICD-10-CM | POA: Diagnosis not present

## 2017-08-15 DIAGNOSIS — Z79899 Other long term (current) drug therapy: Secondary | ICD-10-CM | POA: Insufficient documentation

## 2017-08-15 DIAGNOSIS — I120 Hypertensive chronic kidney disease with stage 5 chronic kidney disease or end stage renal disease: Secondary | ICD-10-CM | POA: Insufficient documentation

## 2017-08-15 DIAGNOSIS — Z992 Dependence on renal dialysis: Secondary | ICD-10-CM | POA: Diagnosis not present

## 2017-08-15 DIAGNOSIS — R1084 Generalized abdominal pain: Secondary | ICD-10-CM | POA: Diagnosis present

## 2017-08-15 DIAGNOSIS — N186 End stage renal disease: Secondary | ICD-10-CM | POA: Diagnosis not present

## 2017-08-15 DIAGNOSIS — F0390 Unspecified dementia without behavioral disturbance: Secondary | ICD-10-CM | POA: Insufficient documentation

## 2017-08-15 DIAGNOSIS — E039 Hypothyroidism, unspecified: Secondary | ICD-10-CM | POA: Diagnosis not present

## 2017-08-15 LAB — COMPREHENSIVE METABOLIC PANEL
ALT: 16 U/L (ref 14–54)
AST: 34 U/L (ref 15–41)
Albumin: 2.3 g/dL — ABNORMAL LOW (ref 3.5–5.0)
Alkaline Phosphatase: 121 U/L (ref 38–126)
Anion gap: 13 (ref 5–15)
BILIRUBIN TOTAL: 1.3 mg/dL — AB (ref 0.3–1.2)
BUN: 26 mg/dL — ABNORMAL HIGH (ref 6–20)
CHLORIDE: 101 mmol/L (ref 101–111)
CO2: 22 mmol/L (ref 22–32)
Calcium: 9.2 mg/dL (ref 8.9–10.3)
Creatinine, Ser: 5.93 mg/dL — ABNORMAL HIGH (ref 0.44–1.00)
GFR calc Af Amer: 7 mL/min — ABNORMAL LOW (ref >60)
GFR, EST NON AFRICAN AMERICAN: 6 mL/min — AB (ref >60)
Glucose, Bld: 93 mg/dL (ref 65–99)
POTASSIUM: 3.8 mmol/L (ref 3.5–5.1)
Sodium: 136 mmol/L (ref 135–145)
TOTAL PROTEIN: 8 g/dL (ref 6.5–8.1)

## 2017-08-15 LAB — CBC
HEMATOCRIT: 33.9 % — AB (ref 36.0–46.0)
HEMOGLOBIN: 10.6 g/dL — AB (ref 12.0–15.0)
MCH: 32.3 pg (ref 26.0–34.0)
MCHC: 31.3 g/dL (ref 30.0–36.0)
MCV: 103.4 fL — AB (ref 78.0–100.0)
Platelets: 99 10*3/uL — ABNORMAL LOW (ref 150–400)
RBC: 3.28 MIL/uL — ABNORMAL LOW (ref 3.87–5.11)
RDW: 23.1 % — ABNORMAL HIGH (ref 11.5–15.5)
WBC: 3.7 10*3/uL — AB (ref 4.0–10.5)

## 2017-08-15 LAB — LIPASE, BLOOD: LIPASE: 50 U/L (ref 11–51)

## 2017-08-15 MED ORDER — ACETAMINOPHEN 500 MG PO TABS
1000.0000 mg | ORAL_TABLET | Freq: Once | ORAL | Status: AC
  Administered 2017-08-15: 1000 mg via ORAL
  Filled 2017-08-15: qty 2

## 2017-08-15 NOTE — ED Notes (Signed)
Pt does not make Urine.

## 2017-08-15 NOTE — ED Notes (Signed)
Samantha [name] has been called and is coming to pick Pt up.

## 2017-08-15 NOTE — ED Notes (Signed)
Pt placed on bedpan

## 2017-08-15 NOTE — ED Provider Notes (Signed)
[facility] EMERGENCY DEPARTMENT Provider Note   CSN: [id number] Arrival date & time: 08/15/17  1123     History   Chief Complaint Chief Complaint  Patient presents with  . Abdominal Pain    HPI Samantha [name] is a 72 y.o. female with a history of chronic abdominal pain, ESRD on hemodialysis (M/W/F) and HOH who presents to the emergency department by EMS from her dialysis center with a chief complaint of abdominal pain.  EMS reports that the patient went to her dialysis appointment today and could not stand to be weighed due to abdominal pain.  Her grandson, legal guardian, reports that she has been evaluated for abdominal pain multiple times in the emergency department.  They recently followed up with GI and have a follow-up appointment in June.  Her last dialysis full dialysis session was Friday.  He denies vomiting or fever.  Patient does not make urine.  Level 5 caveat secondary to dementia.  The history is provided by the patient. No language interpreter was used.  Abdominal Pain   Pertinent negatives include fever and vomiting.    Past Medical History:  Diagnosis Date  . Anemia   . Arthritis    left shoulder  . Cellulitis of left upper extremity   . Constipation   . Dementia   . Diabetes mellitus    borderline  . Dizziness   . Dry skin   . Gastric ulcer   . GERD (gastroesophageal reflux disease)   . GI bleed 06/06/2011  . Gram-negative bacteremia   . H/O: GI bleed   . Headache(784.0)    occasionally  . Hepatitis    Hep B  . History of blood transfusion   . History of kidney stones   . Hypertension   . Hypothyroidism    takes Synthroid daily  . Impaired hearing   . Joint pain   . Joint swelling   . Mechanical complication of other vascular device, implant, and graft 10/08/2013  . Occasional numbness/prickling/tingling of fingers and toes   . Oligouria   . Pancreatitis 04/10/2012  . Peripheral edema   . Renal disorder    m, w, F    . Secondary hyperparathyroidism (HCC) 08/19/2016  . Sepsis (HCC) 06/14/2015  . Shoulder pain   . Vaginal bleeding 08/15/2015    Patient Active Problem List   Diagnosis Date Noted  . S/P ORIF (open reduction internal fixation) fracture 02/20/2017  . 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 Secondary hyperparathyroidism (HCC) 08/19/2016  . Left supracondylar humerus fracture, closed, initial encounter 08/18/2016  . Adnexal mass   . ESRD on dialysis (HCC)   . 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 Thrombocytopenia (HCC) 12/25/2014  . 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 Cardiomegaly 12/25/2014  . Hyponatremia 04/12/2012  . SOB (shortness of breath) 04/08/2012  . Symptomatic cholelithiasis 11/16/2011  . Transaminitis 06/08/2011  . Hepatitis B antibody positive 06/08/2011  . ESRD on hemodialysis (HCC) 06/06/2011  . HTN (hypertension) 06/06/2011  . Shoulder pain 06/06/2011  . Anemia in chronic kidney disease (CKD) 06/06/2011    Past Surgical History:  Procedure Laterality Date  . ARTERIOVENOUS GRAFT PLACEMENT Left    forearm  . cataract surgery     bilateral  . CHOLECYSTECTOMY  12/26/2011  . CHOLECYSTECTOMY  12/26/2011   Procedure: LAPAROSCOPIC CHOLECYSTECTOMY;  Surgeon: Atilano Ina, MD,FACS;  Location: [facility] OR;  Service: General;  Laterality: N/A;  . ESOPHAGOGASTRODUODENOSCOPY  06/09/2011   Procedure: ESOPHAGOGASTRODUODENOSCOPY (EGD);  Surgeon: Theda Belfast, MD;  Location: [facility] ENDOSCOPY;  Service: Endoscopy;  Laterality: N/A;  . I&D EXTREMITY Left 03/23/2015   Procedure: DRAINAGE OF LEFT ARM SEROMA;  Surgeon: Fransisco Hertz, MD;  Location: [facility] OR;  Service: Vascular;  Laterality: Left;  . INSERTION OF DIALYSIS CATHETER Right 01/12/2015   Procedure: INSERTION OF DIALYSIS CATHETER;  Surgeon: Fransisco Hertz, MD;  Location: [facility] OR;  Service: Vascular;  Laterality: Right;  . LIGATION ARTERIOVENOUS GORTEX  GRAFT Left 03/23/2015   Procedure: LIGATION AND EXCISION OF LEFT ARTERIOVENOUS GORTEX GRAFT;  Surgeon: Fransisco Hertz, MD;  Location: [facility] OR;  Service: Vascular;  Laterality: Left;  . ORIF HUMERUS FRACTURE Left 02/20/2017   Procedure: LEFT HUMERUS OPEN REDUCTION INTERNAL FIXATION;  Surgeon: Beverely Low, MD;  Location: [facility] OR;  Service: Orthopedics;  Laterality: Left;  . REVERSE SHOULDER ARTHROPLASTY  09/22/2011   Procedure: REVERSE SHOULDER ARTHROPLASTY;  Surgeon: Verlee Rossetti, MD;  Location: [facility] OR;  Service: Orthopedics;  Laterality: Left;  left reverse shoulder arthroplasty  . REVISION OF ARTERIOVENOUS GORETEX GRAFT Left 01/12/2015   Procedure: REVISION OF Left FOREARM ARTERIOVENOUS GORETEX GRAFT;  Surgeon: Fransisco Hertz, MD;  Location: [facility] OR;  Service: Vascular;  Laterality: Left;  . SHOULDER SURGERY  3-3yrs ago   right replacement  . SHUNTOGRAM Left 08/29/2012   Procedure: SHUNTOGRAM;  Surgeon: Fransisco Hertz, MD;  Location: [facility] CATH LAB;  Service: Cardiovascular;  Laterality: Left;  arm     OB History   None      Home Medications    Prior to Admission medications   Medication Sig Start Date End Date Taking? Authorizing Provider  acetaminophen (TYLENOL) 650 MG CR tablet Take 650 mg by mouth every 8 (eight) hours as needed for pain.    [provider]  HYDROcodone-acetaminophen (NORCO) 5-325 MG tablet Take 1 tablet every 6 (six) hours as needed by mouth for moderate pain. 02/20/17   Beverely Low, MD  HYDROcodone-acetaminophen (NORCO/VICODIN) 5-325 MG tablet Take 1 tablet by mouth every 6 (six) hours as needed for moderate pain. 08/20/15   Leatha Gilding, MD  levothyroxine (SYNTHROID, LEVOTHROID) 50 MCG tablet Take 50 mcg by mouth daily before breakfast.  04/15/15   [provider]  pantoprazole (PROTONIX) 40 MG tablet Take 1 tablet (40 mg total) by mouth 2 (two) times daily before a meal. Patient taking differently: Take 40 mg by mouth daily.  06/18/15   Rolly Salter, MD    senna-docusate (SENOKOT-S) 8.6-50 MG tablet Take 1 tablet by mouth at bedtime as needed for mild constipation. Patient taking differently: Take 1 tablet by mouth daily.  08/20/16   Rama, Maryruth Bun, MD  sucralfate (CARAFATE) 1 GM/10ML suspension Take 10 mLs (1 g total) by mouth 2 (two) times daily as needed. 07/22/17   Tilden Fossa, MD    Family History Family History  Problem Relation Age of Onset  . Hypertension Mother   . Diabetes Mother   . Cancer Brother     Social History Social History   Tobacco Use  . Smoking status: Never Smoker  . Smokeless tobacco: Current User    Types: Chew  Substance Use Topics  . Alcohol use: No    Alcohol/week: 0.0 oz    Comment: quit 4-74yrs ago  . Drug use: No     Allergies   Aspirin; Banana; Chocolate; and Fish-derived products   Review of  Systems Review of Systems  Unable to perform ROS: Dementia  Constitutional: Negative for fever.  Gastrointestinal: Positive for abdominal pain. Negative for vomiting.   Physical Exam Updated Vital Signs BP (!) 139/91   Pulse 86   Temp 98.7 F (37.1 C) (Oral)   Resp 19   SpO2 100%   Physical Exam  Constitutional: No distress.  Pleasantly demented  HENT:  Head: Normocephalic.  Eyes: Conjunctivae are normal.  Neck: Neck supple.  Cardiovascular: Normal rate, regular rhythm, normal heart sounds and intact distal pulses. Exam reveals no gallop and no friction rub.  No murmur heard. Pulmonary/Chest: Effort normal. No stridor. No respiratory distress. She has no wheezes. She has no rales.  Abdominal: Soft. Bowel sounds are normal. She exhibits no distension and no mass. There is no tenderness. There is no rebound and no guarding. No hernia.  Abdomen is soft, nontender, nondistended.  No CVA tenderness bilaterally.  No tenderness over McBurney's point.  Negative Murphy sign.  No peritoneal signs.  Patient states " it hurts it hurts right give me something for pain" before my hand even makes  contact with her abdomen.  Musculoskeletal: She exhibits no tenderness.  Neurological: She is alert.  Skin: Skin is warm. Capillary refill takes less than 2 seconds. No rash noted. She is not diaphoretic. No pallor.  Psychiatric: Her behavior is normal.  Nursing note and vitals reviewed.    ED Treatments / Results  Labs (all labs ordered are listed, but only abnormal results are displayed) Labs Reviewed  COMPREHENSIVE METABOLIC PANEL - Abnormal; Notable for the following components:      Result Value   BUN 26 (*)    Creatinine, Ser 5.93 (*)    Albumin 2.3 (*)    Total Bilirubin 1.3 (*)    GFR calc non Af Amer 6 (*)    GFR calc Af Amer 7 (*)    All other components within normal limits  CBC - Abnormal; Notable for the following components:   WBC 3.7 (*)    RBC 3.28 (*)    Hemoglobin 10.6 (*)    HCT 33.9 (*)    MCV 103.4 (*)    RDW 23.1 (*)    Platelets 99 (*)    All other components within normal limits  LIPASE, BLOOD    EKG EKG Interpretation  Date/Time:  Wednesday Aug 15 2017 13:11:22 EDT Ventricular Rate:  81 PR Interval:    QRS Duration: 98 QT Interval:  397 QTC Calculation: 461 R Axis:   40 Text Interpretation:  Sinus rhythm Low voltage, extremity leads No significant change since last tracing Confirmed by Cathren Laine ([id number]) on 08/15/2017 2:52:29 PM   Radiology No results found.  Procedures Procedures (including critical care time)  Medications Ordered in ED Medications  acetaminophen (TYLENOL) tablet 1,000 mg (1,000 mg Oral Given 08/15/17 1311)     Initial Impression / Assessment and Plan / ED Course  I have reviewed the triage vital signs and the nursing notes.  Pertinent labs & imaging results that were available during my care of the patient were reviewed by me and considered in my medical decision making (see chart for details).     72 year old female with a history of chronic abdominal pain, ESRD on hemodialysis (M/W/F) and HOH presenting by  EMS from her dialysis center for abdominal pain.  Patient was not dialyzed today because she could not stand to be weighed.  On exam, no focal findings on abdominal exam.  The patient  is a difficult stick and labs were not obtained for over an hour.  No peak T waves or other signs of hyperkalemia on EKG due to missing a dialysis session.   Tylenol given for pain control.  On reexamination, the patient is eating cheetos and drinking happily.  Labs appear to be at the patient's baseline.  The patient was discussed and evaluated with Dr. Denton Lank, attending physician.  At this time, doubt hyperkalemia, sepsis, appendicitis, pancreatitis, colitis, or cholecystitis.  The patient has been set up for a dialysis session tomorrow at 11 AM.  This information was conveyed to the patient's grandson, her legal guardian, by phone. Strict return precautions given. NAD and hemodynamically stable. Safe for discharge to home with outpatient follow up at this time.   Final Clinical Impressions(s) / ED Diagnoses   Final diagnoses:  Generalized abdominal pain    ED Discharge Orders    None       Barkley Boards, PA-C 08/15/17 1738    Cathren Laine, MD 08/16/17 1319

## 2017-08-15 NOTE — ED Notes (Signed)
RN called [facility] at [phone] on [address].  Pt has new appt for Dialysis at 11:00 Thursday the 2nd

## 2017-08-15 NOTE — ED Triage Notes (Signed)
Pt in from HD via [facility], per report pt showed up to HD and could not stand to be weighed d/t ABD pain, pt c/o bil lower abd pain, denies n/v/d, pt did not receive HD today, pt hard of hearing

## 2017-08-15 NOTE — ED Notes (Signed)
Pt verbalized understanding discharge instructions and denies any further needs or questions at this time. VS stable,  Pt went home with POA

## 2017-08-15 NOTE — ED Notes (Addendum)
IV attempted without success at right hand after pt gave persmission and states this is where she has blood drawn.

## 2017-08-15 NOTE — Discharge Instructions (Signed)
Please keep your next appointment for dialysis tomorrow at 11 AM.  Take 650 mg of Tylenol once every 6 hours for pain.  Keep your appointment with gastroenterology in June.  Return to the emergency department for severe abdominal pain, persistent vomiting, chest pain, shortness of breath.

## 2017-09-11 ENCOUNTER — Encounter (HOSPITAL_COMMUNITY): Payer: Self-pay

## 2017-09-19 ENCOUNTER — Emergency Department (HOSPITAL_COMMUNITY): Payer: Medicare Other

## 2017-09-19 ENCOUNTER — Encounter (HOSPITAL_COMMUNITY): Payer: Self-pay | Admitting: Emergency Medicine

## 2017-09-19 ENCOUNTER — Emergency Department (HOSPITAL_COMMUNITY)
Admission: EM | Admit: 2017-09-19 | Discharge: 2017-09-19 | Disposition: A | Discharge status: Home / Self Care | Payer: Medicare Other | Attending: Emergency Medicine | Admitting: Emergency Medicine

## 2017-09-19 ENCOUNTER — Other Ambulatory Visit: Payer: Self-pay

## 2017-09-19 DIAGNOSIS — E119 Type 2 diabetes mellitus without complications: Secondary | ICD-10-CM | POA: Insufficient documentation

## 2017-09-19 DIAGNOSIS — Z992 Dependence on renal dialysis: Secondary | ICD-10-CM | POA: Diagnosis not present

## 2017-09-19 DIAGNOSIS — S79911A Unspecified injury of right hip, initial encounter: Secondary | ICD-10-CM | POA: Diagnosis present

## 2017-09-19 DIAGNOSIS — Z79899 Other long term (current) drug therapy: Secondary | ICD-10-CM | POA: Diagnosis not present

## 2017-09-19 DIAGNOSIS — Y939 Activity, unspecified: Secondary | ICD-10-CM | POA: Diagnosis not present

## 2017-09-19 DIAGNOSIS — Y92009 Unspecified place in unspecified non-institutional (private) residence as the place of occurrence of the external cause: Secondary | ICD-10-CM | POA: Insufficient documentation

## 2017-09-19 DIAGNOSIS — Y999 Unspecified external cause status: Secondary | ICD-10-CM | POA: Insufficient documentation

## 2017-09-19 DIAGNOSIS — W19XXXA Unspecified fall, initial encounter: Secondary | ICD-10-CM | POA: Insufficient documentation

## 2017-09-19 DIAGNOSIS — I120 Hypertensive chronic kidney disease with stage 5 chronic kidney disease or end stage renal disease: Secondary | ICD-10-CM | POA: Diagnosis not present

## 2017-09-19 DIAGNOSIS — N186 End stage renal disease: Secondary | ICD-10-CM | POA: Insufficient documentation

## 2017-09-19 DIAGNOSIS — S7001XA Contusion of right hip, initial encounter: Secondary | ICD-10-CM | POA: Insufficient documentation

## 2017-09-19 MED ORDER — HYDROCODONE-ACETAMINOPHEN 5-325 MG PO TABS
2.0000 | ORAL_TABLET | Freq: Once | ORAL | Status: AC
  Administered 2017-09-19: 2 via ORAL
  Filled 2017-09-19: qty 2

## 2017-09-19 NOTE — Discharge Instructions (Addendum)
Continue hydrocodone as prescribed as needed for pain.  Follow-up with your primary doctor if symptoms are not improving in the next week.

## 2017-09-19 NOTE — ED Notes (Signed)
Patient transported to X-ray 

## 2017-09-19 NOTE — ED Provider Notes (Signed)
[facility] Provider Note   CSN: [id number] Arrival date & time: 09/19/17  0416     History   Chief Complaint Chief Complaint  Patient presents with  . Fall    HPI Samantha [name] is a 72 y.o. female.  Patient is a 72 year old female with history of diabetes, hypertension, and end-stage renal disease on hemodialysis brought by EMS for evaluation of a fall.  She apparently lost Samantha [name] balance and injured Samantha [name] right hip.  She denies other injury.  She is somewhat difficult to get a history from and cannot elaborate much more than "I hurt real bad".  The history is provided by the patient.  Fall  This is a new problem. The current episode started 1 to 2 hours ago. The problem occurs constantly. The problem has not changed since onset.Pertinent negatives include no chest pain and no abdominal pain. She has tried nothing for the symptoms.    Past Medical History:  Diagnosis Date  . Anemia   . Arthritis    left shoulder  . Cellulitis of left upper extremity   . Constipation   . Dementia   . Diabetes mellitus without complication (HCC)    borderline  . Dizziness   . Dry skin   . Gastric ulcer   . GERD (gastroesophageal reflux disease)   . GI bleed 06/06/2011  . Gram-negative bacteremia   . H/O: GI bleed   . Headache(784.0)    occasionally  . Hepatitis    Hep B  . History of blood transfusion   . History of kidney stones   . Hypertension   . Hypothyroidism    takes Synthroid daily  . Impaired hearing   . Joint pain   . Joint swelling   . Mechanical complication of other vascular device, implant, and graft 10/08/2013  . Occasional numbness/prickling/tingling of fingers and toes   . Oligouria   . Pancreatitis 04/10/2012  . Peripheral edema   . Renal disorder    m, w, F   . Secondary hyperparathyroidism (HCC) 08/19/2016  . Sepsis (HCC) 06/14/2015  . Shoulder pain   . Vaginal bleeding 08/15/2015    Patient Active Problem List   Diagnosis Date Noted  . S/P ORIF (open reduction internal fixation) fracture 02/20/2017  . 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 Secondary hyperparathyroidism (HCC) 08/19/2016  . Left supracondylar humerus fracture, closed, initial encounter 08/18/2016  . Adnexal mass   . ESRD on dialysis (HCC)   . 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 Thrombocytopenia (HCC) 12/25/2014  . 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 Cardiomegaly 12/25/2014  . Hyponatremia 04/12/2012  . SOB (shortness of breath) 04/08/2012  . Symptomatic cholelithiasis 11/16/2011  . Transaminitis 06/08/2011  . Hepatitis B antibody positive 06/08/2011  . ESRD on hemodialysis (HCC) 06/06/2011  . HTN (hypertension) 06/06/2011  . Shoulder pain 06/06/2011  . Anemia in chronic kidney disease (CKD) 06/06/2011    Past Surgical History:  Procedure Laterality Date  . ARTERIOVENOUS GRAFT PLACEMENT Left    forearm  . cataract surgery     bilateral  . CHOLECYSTECTOMY  12/26/2011  . CHOLECYSTECTOMY  12/26/2011   Procedure: LAPAROSCOPIC CHOLECYSTECTOMY;  Surgeon: Atilano InaEric M Wilson, MD,FACS;  Location: [facility] OR;  Service: General;  Laterality: N/A;  . ESOPHAGOGASTRODUODENOSCOPY  06/09/2011   Procedure: ESOPHAGOGASTRODUODENOSCOPY (EGD);  Surgeon: Theda BelfastPatrick D Hung, MD;  [facility];  Service: Endoscopy;  Laterality:  N/A;  . I&D EXTREMITY Left 03/23/2015   Procedure: DRAINAGE OF LEFT ARM SEROMA;  Surgeon: Fransisco Hertz, MD;  Location: [facility] OR;  Service: Vascular;  Laterality: Left;  . INSERTION OF DIALYSIS CATHETER Right 01/12/2015   Procedure: INSERTION OF DIALYSIS CATHETER;  Surgeon: Fransisco Hertz, MD;  Location: [facility] OR;  Service: Vascular;  Laterality: Right;  . LIGATION ARTERIOVENOUS GORTEX GRAFT Left 03/23/2015   Procedure: LIGATION AND EXCISION OF LEFT ARTERIOVENOUS GORTEX GRAFT;  Surgeon: Fransisco Hertz, MD;  Location: [facility] OR;  Service: Vascular;   Laterality: Left;  . ORIF HUMERUS FRACTURE Left 02/20/2017   Procedure: LEFT HUMERUS OPEN REDUCTION INTERNAL FIXATION;  Surgeon: Beverely Low, MD;  Location: [facility] OR;  Service: Orthopedics;  Laterality: Left;  . REVERSE SHOULDER ARTHROPLASTY  09/22/2011   Procedure: REVERSE SHOULDER ARTHROPLASTY;  Surgeon: Verlee Rossetti, MD;  Location: [facility] OR;  Service: Orthopedics;  Laterality: Left;  left reverse shoulder arthroplasty  . REVISION OF ARTERIOVENOUS GORETEX GRAFT Left 01/12/2015   Procedure: REVISION OF Left FOREARM ARTERIOVENOUS GORETEX GRAFT;  Surgeon: Fransisco Hertz, MD;  Location: [facility] OR;  Service: Vascular;  Laterality: Left;  . SHOULDER SURGERY  3-64yrs ago   right replacement  . SHUNTOGRAM Left 08/29/2012   Procedure: SHUNTOGRAM;  Surgeon: Fransisco Hertz, MD;  Location: [facility] CATH LAB;  Service: Cardiovascular;  Laterality: Left;  arm     OB History   None      Home Medications    Prior to Admission medications   Medication Sig Start Date End Date Taking? Authorizing Provider  acetaminophen (TYLENOL) 650 MG CR tablet Take 650 mg by mouth every 8 (eight) hours as needed for pain.    [provider]  dicyclomine (BENTYL) 20 MG tablet Take 20 mg by mouth 3 (three) times daily. 08/21/17   [provider]  HYDROcodone-acetaminophen (NORCO) 5-325 MG tablet Take 1 tablet every 6 (six) hours as needed by mouth for moderate pain. Patient taking differently: Take 1 tablet by mouth every 8 (eight) hours as needed for moderate pain.  02/20/17   Beverely Low, MD  HYDROcodone-acetaminophen (NORCO/VICODIN) 5-325 MG tablet Take 1 tablet by mouth every 6 (six) hours as needed for moderate pain. Patient not taking: Reported on 09/11/2017 08/20/15   Leatha Gilding, MD  levothyroxine (SYNTHROID, LEVOTHROID) 50 MCG tablet Take 50 mcg by mouth daily before breakfast.  04/15/15   [provider]  Menthol, Topical Analgesic, (ICY HOT EX) Apply 1 application topically 2 (two) times daily.     [provider]  Multiple Vitamins-Minerals (MULTIVITAMIN ADULT PO) Take 1 tablet by mouth daily.    [provider]  pantoprazole (PROTONIX) 40 MG tablet Take 1 tablet (40 mg total) by mouth 2 (two) times daily before a meal. Patient taking differently: Take 40 mg by mouth daily.  06/18/15   Rolly Salter, MD  senna-docusate (SENOKOT-S) 8.6-50 MG tablet Take 1 tablet by mouth at bedtime as needed for mild constipation. Patient taking differently: Take 1 tablet by mouth daily.  08/20/16   Rama, Maryruth Bun, MD  sucralfate (CARAFATE) 1 GM/10ML suspension Take 10 mLs (1 g total) by mouth 2 (two) times daily as needed. Patient not taking: Reported on 09/11/2017 07/22/17   Tilden Fossa, MD    Family History Family History  Problem Relation Age of Onset  . Hypertension Mother   . Diabetes Mother   . Cancer Brother     Social History Social History   Tobacco  Use  . Smoking status: Never Smoker  . Smokeless tobacco: Current User    Types: Chew  Substance Use Topics  . Alcohol use: No    Alcohol/week: 0.0 oz    Comment: quit 4-40yrs ago  . Drug use: No     Allergies   Aspirin; Banana; Chocolate; and Fish-derived products   Review of Systems Review of Systems  Cardiovascular: Negative for chest pain.  Gastrointestinal: Negative for abdominal pain.  All other systems reviewed and are negative.    Physical Exam Updated Vital Signs BP 107/75 (BP Location: Right Arm)   Pulse 92   Temp 98.3 F (36.8 C) (Oral)   Resp 18   SpO2 100%   Physical Exam  Constitutional: She is oriented to person, place, and time. She appears well-developed and well-nourished. No distress.  HENT:  Head: Normocephalic and atraumatic.  Neck: Normal range of motion. Neck supple.  Cardiovascular: Normal rate and regular rhythm. Exam reveals no gallop and no friction rub.  No murmur heard. Pulmonary/Chest: Effort normal and breath sounds normal. No respiratory distress. She has no  wheezes.  Abdominal: Soft. Bowel sounds are normal. She exhibits no distension. There is no tenderness.  Musculoskeletal: Normal range of motion.  There is tenderness to palpation over the right anterior hip and groin region.  She seems to have good range of motion with some discomfort.  There is no shortening or rotation of the leg.  PMS is intact to the right lower extremity.  Neurological: She is alert and oriented to person, place, and time.  Skin: Skin is warm and dry. She is not diaphoretic.  Nursing note and vitals reviewed.    ED Treatments / Results  Labs (all labs ordered are listed, but only abnormal results are displayed) Labs Reviewed - No data to display  EKG None  Radiology No results found.  Procedures Procedures (including critical care time)  Medications Ordered in ED Medications - No data to display   Initial Impression / Assessment and Plan / ED Course  I have reviewed the triage vital signs and the nursing notes.  Pertinent labs & imaging results that were available during my care of the patient were reviewed by me and considered in my medical decision making (see chart for details).  Patient brought by EMS after experiencing a fall at home.  According to Samantha [name] grandson who is 1 of Samantha [name] caregivers, she fell Sunday, then again last night.  She is complaining of pain in Samantha [name] right hip.  She appears to have good range of motion and x-rays do not reveal a fracture.  I see no indication for admission or further work-up.  It appears as though she can be safely discharged, to follow-up as needed.  Final Clinical Impressions(s) / ED Diagnoses   Final diagnoses:  None    ED Discharge Orders    None       Geoffery Lyons, MD 09/19/17 [phone]

## 2017-09-19 NOTE — ED Triage Notes (Signed)
Patient arrived with EMS from home . Lost her balance and fell , no LOC , reports right hip pain radiating to right thigh , no LOC , alert at arrival . Hemodialysis q Mon/Wed/Fri.

## 2017-10-02 ENCOUNTER — Emergency Department (HOSPITAL_COMMUNITY): Payer: Medicare Other

## 2017-10-02 ENCOUNTER — Inpatient Hospital Stay (HOSPITAL_COMMUNITY)
Admission: EM | Admit: 2017-10-02 | Discharge: 2017-10-10 | DRG: 640 | Disposition: A | Discharge status: Hospice | Payer: Medicare Other | Attending: Internal Medicine | Admitting: Internal Medicine

## 2017-10-02 ENCOUNTER — Encounter (HOSPITAL_COMMUNITY): Payer: Self-pay

## 2017-10-02 ENCOUNTER — Inpatient Hospital Stay (HOSPITAL_COMMUNITY): Payer: Medicare Other

## 2017-10-02 ENCOUNTER — Other Ambulatory Visit: Payer: Self-pay

## 2017-10-02 DIAGNOSIS — I132 Hypertensive heart and chronic kidney disease with heart failure and with stage 5 chronic kidney disease, or end stage renal disease: Secondary | ICD-10-CM | POA: Diagnosis present

## 2017-10-02 DIAGNOSIS — F0150 Vascular dementia without behavioral disturbance: Secondary | ICD-10-CM | POA: Diagnosis not present

## 2017-10-02 DIAGNOSIS — Z720 Tobacco use: Secondary | ICD-10-CM

## 2017-10-02 DIAGNOSIS — N189 Chronic kidney disease, unspecified: Secondary | ICD-10-CM

## 2017-10-02 DIAGNOSIS — E039 Hypothyroidism, unspecified: Secondary | ICD-10-CM

## 2017-10-02 DIAGNOSIS — Z79899 Other long term (current) drug therapy: Secondary | ICD-10-CM

## 2017-10-02 DIAGNOSIS — I5032 Chronic diastolic (congestive) heart failure: Secondary | ICD-10-CM | POA: Diagnosis present

## 2017-10-02 DIAGNOSIS — Z66 Do not resuscitate: Secondary | ICD-10-CM | POA: Diagnosis present

## 2017-10-02 DIAGNOSIS — Z886 Allergy status to analgesic agent status: Secondary | ICD-10-CM

## 2017-10-02 DIAGNOSIS — G9340 Encephalopathy, unspecified: Secondary | ICD-10-CM | POA: Diagnosis present

## 2017-10-02 DIAGNOSIS — K766 Portal hypertension: Secondary | ICD-10-CM | POA: Diagnosis present

## 2017-10-02 DIAGNOSIS — N2581 Secondary hyperparathyroidism of renal origin: Secondary | ICD-10-CM | POA: Diagnosis present

## 2017-10-02 DIAGNOSIS — Z992 Dependence on renal dialysis: Secondary | ICD-10-CM

## 2017-10-02 DIAGNOSIS — Z96612 Presence of left artificial shoulder joint: Secondary | ICD-10-CM | POA: Diagnosis present

## 2017-10-02 DIAGNOSIS — E162 Hypoglycemia, unspecified: Secondary | ICD-10-CM

## 2017-10-02 DIAGNOSIS — G9341 Metabolic encephalopathy: Secondary | ICD-10-CM | POA: Diagnosis present

## 2017-10-02 DIAGNOSIS — F0151 Vascular dementia with behavioral disturbance: Secondary | ICD-10-CM | POA: Diagnosis present

## 2017-10-02 DIAGNOSIS — W19XXXD Unspecified fall, subsequent encounter: Secondary | ICD-10-CM | POA: Diagnosis not present

## 2017-10-02 DIAGNOSIS — N186 End stage renal disease: Secondary | ICD-10-CM | POA: Diagnosis present

## 2017-10-02 DIAGNOSIS — M25552 Pain in left hip: Secondary | ICD-10-CM

## 2017-10-02 DIAGNOSIS — I63011 Cerebral infarction due to thrombosis of right vertebral artery: Secondary | ICD-10-CM | POA: Diagnosis not present

## 2017-10-02 DIAGNOSIS — R195 Other fecal abnormalities: Secondary | ICD-10-CM | POA: Diagnosis present

## 2017-10-02 DIAGNOSIS — Z8673 Personal history of transient ischemic attack (TIA), and cerebral infarction without residual deficits: Secondary | ICD-10-CM | POA: Diagnosis not present

## 2017-10-02 DIAGNOSIS — Z515 Encounter for palliative care: Secondary | ICD-10-CM

## 2017-10-02 DIAGNOSIS — I639 Cerebral infarction, unspecified: Secondary | ICD-10-CM | POA: Diagnosis not present

## 2017-10-02 DIAGNOSIS — W19XXXS Unspecified fall, sequela: Secondary | ICD-10-CM | POA: Diagnosis not present

## 2017-10-02 DIAGNOSIS — A419 Sepsis, unspecified organism: Secondary | ICD-10-CM

## 2017-10-02 DIAGNOSIS — I63411 Cerebral infarction due to embolism of right middle cerebral artery: Secondary | ICD-10-CM

## 2017-10-02 DIAGNOSIS — K219 Gastro-esophageal reflux disease without esophagitis: Secondary | ICD-10-CM | POA: Diagnosis present

## 2017-10-02 DIAGNOSIS — Z7401 Bed confinement status: Secondary | ICD-10-CM | POA: Diagnosis not present

## 2017-10-02 DIAGNOSIS — E875 Hyperkalemia: Secondary | ICD-10-CM | POA: Diagnosis present

## 2017-10-02 DIAGNOSIS — I340 Nonrheumatic mitral (valve) insufficiency: Secondary | ICD-10-CM | POA: Diagnosis not present

## 2017-10-02 DIAGNOSIS — W19XXXA Unspecified fall, initial encounter: Secondary | ICD-10-CM

## 2017-10-02 DIAGNOSIS — M25559 Pain in unspecified hip: Secondary | ICD-10-CM

## 2017-10-02 DIAGNOSIS — H9190 Unspecified hearing loss, unspecified ear: Secondary | ICD-10-CM | POA: Diagnosis present

## 2017-10-02 DIAGNOSIS — R6510 Systemic inflammatory response syndrome (SIRS) of non-infectious origin without acute organ dysfunction: Secondary | ICD-10-CM

## 2017-10-02 DIAGNOSIS — R627 Adult failure to thrive: Secondary | ICD-10-CM | POA: Diagnosis present

## 2017-10-02 DIAGNOSIS — B1910 Unspecified viral hepatitis B without hepatic coma: Secondary | ICD-10-CM | POA: Diagnosis present

## 2017-10-02 DIAGNOSIS — I517 Cardiomegaly: Secondary | ICD-10-CM | POA: Diagnosis not present

## 2017-10-02 DIAGNOSIS — D631 Anemia in chronic kidney disease: Secondary | ICD-10-CM | POA: Diagnosis present

## 2017-10-02 DIAGNOSIS — Z7189 Other specified counseling: Secondary | ICD-10-CM | POA: Diagnosis not present

## 2017-10-02 DIAGNOSIS — R5381 Other malaise: Secondary | ICD-10-CM | POA: Diagnosis not present

## 2017-10-02 DIAGNOSIS — F01518 Vascular dementia, unspecified severity, with other behavioral disturbance: Secondary | ICD-10-CM

## 2017-10-02 DIAGNOSIS — M25551 Pain in right hip: Secondary | ICD-10-CM

## 2017-10-02 DIAGNOSIS — I499 Cardiac arrhythmia, unspecified: Secondary | ICD-10-CM | POA: Diagnosis not present

## 2017-10-02 DIAGNOSIS — K7290 Hepatic failure, unspecified without coma: Secondary | ICD-10-CM | POA: Diagnosis present

## 2017-10-02 DIAGNOSIS — L98429 Non-pressure chronic ulcer of back with unspecified severity: Secondary | ICD-10-CM | POA: Diagnosis present

## 2017-10-02 DIAGNOSIS — I959 Hypotension, unspecified: Secondary | ICD-10-CM | POA: Diagnosis present

## 2017-10-02 DIAGNOSIS — K7030 Alcoholic cirrhosis of liver without ascites: Secondary | ICD-10-CM | POA: Diagnosis present

## 2017-10-02 DIAGNOSIS — N183 Chronic kidney disease, stage 3 (moderate): Secondary | ICD-10-CM | POA: Diagnosis not present

## 2017-10-02 LAB — I-STAT CHEM 8, ED
BUN: 75 mg/dL — AB (ref 6–20)
CALCIUM ION: 0.99 mmol/L — AB (ref 1.15–1.40)
CREATININE: 8 mg/dL — AB (ref 0.44–1.00)
Chloride: 103 mmol/L (ref 101–111)
GLUCOSE: 84 mg/dL (ref 65–99)
HCT: 35 % — ABNORMAL LOW (ref 36.0–46.0)
HEMOGLOBIN: 11.9 g/dL — AB (ref 12.0–15.0)
Potassium: 6.8 mmol/L (ref 3.5–5.1)
Sodium: 136 mmol/L (ref 135–145)
TCO2: 27 mmol/L (ref 22–32)

## 2017-10-02 LAB — CBG MONITORING, ED
Glucose-Capillary: 50 mg/dL — ABNORMAL LOW (ref 65–99)
Glucose-Capillary: 56 mg/dL — ABNORMAL LOW (ref 65–99)

## 2017-10-02 LAB — CBC WITH DIFFERENTIAL/PLATELET
BASOS PCT: 2 %
Basophils Absolute: 0.1 10*3/uL (ref 0.0–0.1)
EOS ABS: 0 10*3/uL (ref 0.0–0.7)
EOS PCT: 0 %
HCT: 34.6 % — ABNORMAL LOW (ref 36.0–46.0)
Hemoglobin: 10.1 g/dL — ABNORMAL LOW (ref 12.0–15.0)
LYMPHS ABS: 1 10*3/uL (ref 0.7–4.0)
Lymphocytes Relative: 14 %
MCH: 31.5 pg (ref 26.0–34.0)
MCHC: 29.2 g/dL — AB (ref 30.0–36.0)
MCV: 107.8 fL — AB (ref 78.0–100.0)
MONO ABS: 0.7 10*3/uL (ref 0.1–1.0)
Monocytes Relative: 10 %
NEUTROS ABS: 5.4 10*3/uL (ref 1.7–7.7)
NEUTROS PCT: 74 %
PLATELETS: 123 10*3/uL — AB (ref 150–400)
RBC: 3.21 MIL/uL — ABNORMAL LOW (ref 3.87–5.11)
RDW: 20.5 % — ABNORMAL HIGH (ref 11.5–15.5)
WBC: 7.2 10*3/uL (ref 4.0–10.5)

## 2017-10-02 LAB — COMPREHENSIVE METABOLIC PANEL
ALT: 27 U/L (ref 14–54)
ANION GAP: 16 — AB (ref 5–15)
AST: 53 U/L — ABNORMAL HIGH (ref 15–41)
Albumin: 2 g/dL — ABNORMAL LOW (ref 3.5–5.0)
Alkaline Phosphatase: 118 U/L (ref 38–126)
BUN: 52 mg/dL — ABNORMAL HIGH (ref 6–20)
CALCIUM: 9.1 mg/dL (ref 8.9–10.3)
CO2: 24 mmol/L (ref 22–32)
CREATININE: 7.67 mg/dL — AB (ref 0.44–1.00)
Chloride: 99 mmol/L — ABNORMAL LOW (ref 101–111)
GFR, EST AFRICAN AMERICAN: 5 mL/min — AB (ref >60)
GFR, EST NON AFRICAN AMERICAN: 5 mL/min — AB (ref >60)
Glucose, Bld: 84 mg/dL (ref 65–99)
Potassium: 5.3 mmol/L — ABNORMAL HIGH (ref 3.5–5.1)
Sodium: 139 mmol/L (ref 135–145)
Total Bilirubin: 1.8 mg/dL — ABNORMAL HIGH (ref 0.3–1.2)
Total Protein: 8 g/dL (ref 6.5–8.1)

## 2017-10-02 LAB — I-STAT CG4 LACTIC ACID, ED: LACTIC ACID, VENOUS: 3.42 mmol/L — AB (ref 0.5–1.9)

## 2017-10-02 LAB — AMMONIA: AMMONIA: 59 umol/L — AB (ref 9–35)

## 2017-10-02 MED ORDER — SODIUM CHLORIDE 0.9% FLUSH
3.0000 mL | INTRAVENOUS | Status: DC | PRN
  Administered 2017-10-03: 3 mL via INTRAVENOUS
  Filled 2017-10-02: qty 3

## 2017-10-02 MED ORDER — PIPERACILLIN-TAZOBACTAM 3.375 G IVPB 30 MIN
3.3750 g | Freq: Once | INTRAVENOUS | Status: AC
  Administered 2017-10-02: 3.375 g via INTRAVENOUS
  Filled 2017-10-02: qty 50

## 2017-10-02 MED ORDER — CHLORHEXIDINE GLUCONATE CLOTH 2 % EX PADS
6.0000 | MEDICATED_PAD | Freq: Every day | CUTANEOUS | Status: DC
  Administered 2017-10-05 – 2017-10-10 (×5): 6 via TOPICAL

## 2017-10-02 MED ORDER — LEVOTHYROXINE SODIUM 50 MCG PO TABS
50.0000 ug | ORAL_TABLET | Freq: Every day | ORAL | Status: DC
  Administered 2017-10-04 – 2017-10-09 (×6): 50 ug via ORAL
  Filled 2017-10-02 (×7): qty 1

## 2017-10-02 MED ORDER — PIPERACILLIN-TAZOBACTAM IN DEX 2-0.25 GM/50ML IV SOLN
2.2500 g | Freq: Three times a day (TID) | INTRAVENOUS | Status: DC
  Filled 2017-10-02: qty 50

## 2017-10-02 MED ORDER — ACETAMINOPHEN 650 MG RE SUPP: 650.0000 mg | Freq: Four times a day (QID) | RECTAL | Status: DC | PRN

## 2017-10-02 MED ORDER — SODIUM CHLORIDE 0.9% FLUSH
3.0000 mL | Freq: Two times a day (BID) | INTRAVENOUS | Status: DC
  Administered 2017-10-05 – 2017-10-09 (×8): 3 mL via INTRAVENOUS

## 2017-10-02 MED ORDER — FENTANYL CITRATE (PF) 100 MCG/2ML IJ SOLN
12.5000 ug | INTRAMUSCULAR | Status: DC | PRN
  Administered 2017-10-03 – 2017-10-05 (×4): 25 ug via INTRAVENOUS
  Filled 2017-10-02 (×5): qty 2

## 2017-10-02 MED ORDER — ONDANSETRON HCL 4 MG/2ML IJ SOLN: 4.0000 mg | Freq: Four times a day (QID) | INTRAMUSCULAR | Status: DC | PRN

## 2017-10-02 MED ORDER — DEXTROSE 50 % IV SOLN
50.0000 mL | Freq: Once | INTRAVENOUS | Status: AC
  Administered 2017-10-02: 50 mL via INTRAVENOUS
  Filled 2017-10-02: qty 50

## 2017-10-02 MED ORDER — SODIUM BICARBONATE 8.4 % IV SOLN
50.0000 meq | Freq: Once | INTRAVENOUS | Status: AC
  Administered 2017-10-02: 50 meq via INTRAVENOUS
  Filled 2017-10-02: qty 50

## 2017-10-02 MED ORDER — SODIUM CHLORIDE 0.9% FLUSH
3.0000 mL | Freq: Two times a day (BID) | INTRAVENOUS | Status: DC
  Administered 2017-10-03: 3 mL via INTRAVENOUS

## 2017-10-02 MED ORDER — INSULIN ASPART 100 UNIT/ML IV SOLN
10.0000 [IU] | Freq: Once | INTRAVENOUS | Status: AC
  Administered 2017-10-02: 10 [IU] via INTRAVENOUS
  Filled 2017-10-02: qty 0.1

## 2017-10-02 MED ORDER — CALCIUM GLUCONATE 10 % IV SOLN
1.0000 g | Freq: Once | INTRAVENOUS | Status: AC
  Administered 2017-10-02: 1 g via INTRAVENOUS
  Filled 2017-10-02: qty 10

## 2017-10-02 MED ORDER — ONDANSETRON HCL 4 MG PO TABS: 4.0000 mg | ORAL_TABLET | Freq: Four times a day (QID) | ORAL | Status: DC | PRN

## 2017-10-02 MED ORDER — SODIUM CHLORIDE 0.9 % IV SOLN: 250.0000 mL | INTRAVENOUS | Status: DC | PRN

## 2017-10-02 MED ORDER — DEXTROSE 10 % IV SOLN
Freq: Once | INTRAVENOUS | Status: AC
  Administered 2017-10-02: 21:00:00 via INTRAVENOUS

## 2017-10-02 MED ORDER — LACTULOSE 10 GM/15ML PO SOLN
20.0000 g | Freq: Three times a day (TID) | ORAL | Status: DC
  Filled 2017-10-02: qty 30

## 2017-10-02 MED ORDER — VANCOMYCIN HCL 10 G IV SOLR
2000.0000 mg | Freq: Once | INTRAVENOUS | Status: AC
  Administered 2017-10-02: 2000 mg via INTRAVENOUS
  Filled 2017-10-02: qty 2000

## 2017-10-02 MED ORDER — PIPERACILLIN-TAZOBACTAM 3.375 G IVPB
3.3750 g | Freq: Two times a day (BID) | INTRAVENOUS | Status: DC
  Administered 2017-10-03: 3.375 g via INTRAVENOUS
  Filled 2017-10-02 (×2): qty 50

## 2017-10-02 MED ORDER — STROKE: EARLY STAGES OF RECOVERY BOOK
Freq: Once | Status: DC
  Filled 2017-10-02: qty 1

## 2017-10-02 MED ORDER — ACETAMINOPHEN 325 MG PO TABS: 650.0000 mg | ORAL_TABLET | Freq: Four times a day (QID) | ORAL | Status: DC | PRN

## 2017-10-02 NOTE — ED Notes (Signed)
Delay in giving antibiotics, pt is a difficult stick and phelbotomy is at bedside drawing cultures.

## 2017-10-02 NOTE — ED Triage Notes (Signed)
[facility]. Pt lives w/ grandson who states he went to the store (maybe 10 minutes, pt was asleep at the time) reports that when he returned Pt was sitting on the floor. Family also reports to EMS that she has not been eating, only drinking ensure, decreased urine output, missed dialysis Monday (is MWF).

## 2017-10-02 NOTE — H&P (Signed)
History and Physical    Samantha [name]:[id number] DOB: 1946-04-01 DOA: 10/02/2017  PCP: Fleet Contras, MD   Patient coming from: Home  Chief Complaint: AMS   HPI: Samantha [name] is a 72 y.o. female with medical history significant for dementia, end-stage renal disease on hemodialysis, hypothyroidism, recent fall, and decline in her physical and cognitive status over the past 2 weeks or so, now presenting to the emergency department with altered mental status after being found on the ground at home.  Patient lives with her grandson who reports that she fell approximately 2 weeks ago, was evaluated in the emergency department with negative radiographs at that time, but has been bedbound since then, complaining of hip and pelvic pain.  Patient has been eating and drinking very little, essentially only taking Ensure for the past couple weeks and has been bedbound over this interval.  Patient's grandson reports that she was in her bed when he went to the store, but found on the floor next to her bed when he returned several minutes later.  She missed her last dialysis session due to altered mental status and lethargy.  ED Course: Upon arrival to the ED, patient is found to have a temp of 36.1 C, saturating low 90s on room air, slightly tachycardic, and with blood pressure 103/73.  EKG features a sinus rhythm, chest x-ray is notable for stable cardiomegaly but no acute findings, and noncontrast head CT reveals a new hypodensity in the right parietal lobe suspicious for recent nonhemorrhagic infarct.  Chemistry panel features a potassium of 6.8.  Ammonia is elevated to 59, lactic acid elevated to 3.42, and hemoglobin stable at 10.1 with MCV of 107.8.  Blood cultures were collected and the patient was treated with vancomycin, Zosyn, bicarbonate, insulin with dextrose, and IV calcium in the ED.  Nephrology was consulted by the ED physician and will arrange urgent dialysis.  Patient will be admitted for  ongoing evaluation and management of hyperkalemia and acute encephalopathy with likely recent stroke and hyperammonemia, as well as possible infection.  Review of Systems:  All other systems reviewed and apart from HPI, are negative.  Past Medical History:  Diagnosis Date  . Anemia   . Arthritis    left shoulder  . Cellulitis of left upper extremity   . Constipation   . Dementia   . Diabetes mellitus without complication (HCC)    borderline  . Dizziness   . Dry skin   . Gastric ulcer   . GERD (gastroesophageal reflux disease)   . GI bleed 06/06/2011  . Gram-negative bacteremia   . H/O: GI bleed   . Headache(784.0)    occasionally  . Hepatitis    Hep B  . History of blood transfusion   . History of kidney stones   . Hypertension   . Hypothyroidism    takes Synthroid daily  . Impaired hearing   . Joint pain   . Joint swelling   . Mechanical complication of other vascular device, implant, and graft 10/08/2013  . Occasional numbness/prickling/tingling of fingers and toes   . Oligouria   . Pancreatitis 04/10/2012  . Peripheral edema   . Renal disorder    m, w, F   . Secondary hyperparathyroidism (HCC) 08/19/2016  . Sepsis (HCC) 06/14/2015  . Shoulder pain   . Vaginal bleeding 08/15/2015    Past Surgical History:  Procedure Laterality Date  . ARTERIOVENOUS GRAFT PLACEMENT Left    forearm  . cataract surgery  bilateral  . CHOLECYSTECTOMY  12/26/2011  . CHOLECYSTECTOMY  12/26/2011   Procedure: LAPAROSCOPIC CHOLECYSTECTOMY;  Surgeon: Atilano Ina, MD,FACS;  Location: [facility] OR;  Service: General;  Laterality: N/A;  . ESOPHAGOGASTRODUODENOSCOPY  06/09/2011   Procedure: ESOPHAGOGASTRODUODENOSCOPY (EGD);  Surgeon: Theda Belfast, MD;  Location: [facility] ENDOSCOPY;  Service: Endoscopy;  Laterality: N/A;  . I&D EXTREMITY Left 03/23/2015   Procedure: DRAINAGE OF LEFT ARM SEROMA;  Surgeon: Fransisco Hertz, MD;  Location: [facility] OR;  Service: Vascular;  Laterality: Left;  . INSERTION OF DIALYSIS  CATHETER Right 01/12/2015   Procedure: INSERTION OF DIALYSIS CATHETER;  Surgeon: Fransisco Hertz, MD;  Location: [facility] OR;  Service: Vascular;  Laterality: Right;  . LIGATION ARTERIOVENOUS GORTEX GRAFT Left 03/23/2015   Procedure: LIGATION AND EXCISION OF LEFT ARTERIOVENOUS GORTEX GRAFT;  Surgeon: Fransisco Hertz, MD;  Location: [facility] OR;  Service: Vascular;  Laterality: Left;  . ORIF HUMERUS FRACTURE Left 02/20/2017   Procedure: LEFT HUMERUS OPEN REDUCTION INTERNAL FIXATION;  Surgeon: Beverely Low, MD;  Location: [facility] OR;  Service: Orthopedics;  Laterality: Left;  . REVERSE SHOULDER ARTHROPLASTY  09/22/2011   Procedure: REVERSE SHOULDER ARTHROPLASTY;  Surgeon: Verlee Rossetti, MD;  Location: [facility] OR;  Service: Orthopedics;  Laterality: Left;  left reverse shoulder arthroplasty  . REVISION OF ARTERIOVENOUS GORETEX GRAFT Left 01/12/2015   Procedure: REVISION OF Left FOREARM ARTERIOVENOUS GORETEX GRAFT;  Surgeon: Fransisco Hertz, MD;  Location: [facility] OR;  Service: Vascular;  Laterality: Left;  . SHOULDER SURGERY  3-85yrs ago   right replacement  . SHUNTOGRAM Left 08/29/2012   Procedure: SHUNTOGRAM;  Surgeon: Fransisco Hertz, MD;  Location: [facility] CATH LAB;  Service: Cardiovascular;  Laterality: Left;  arm     reports that she has never smoked. Her smokeless tobacco use includes chew. She reports that she does not drink alcohol or use drugs.  Allergies  Allergen Reactions  . Aspirin Other (See Comments)    Per Md.  . Banana Other (See Comments)    Due to dialysis  . Chocolate Other (See Comments)    Due to dialysis  . Fish-Derived Products Other (See Comments)    Due to gout    Family History  Problem Relation Age of Onset  . Hypertension Mother   . Diabetes Mother   . Cancer Brother      Prior to Admission medications   Medication Sig Start Date End Date Taking? Authorizing Provider  acetaminophen (TYLENOL) 650 MG CR tablet Take 650 mg by mouth every 8 (eight) hours as needed for pain.   Yes [provider]  dicyclomine (BENTYL) 20 MG tablet Take 20 mg by mouth 3 (three) times daily. 08/21/17  Yes [provider]  levothyroxine (SYNTHROID, LEVOTHROID) 50 MCG tablet Take 50 mcg by mouth daily before breakfast.  04/15/15  Yes [provider]  Multiple Vitamins-Minerals (MULTIVITAMIN ADULT PO) Take 1 tablet by mouth daily.   Yes [provider]  pantoprazole (PROTONIX) 40 MG tablet Take 1 tablet (40 mg total) by mouth 2 (two) times daily before a meal. Patient taking differently: Take 40 mg by mouth daily.  06/18/15  Yes Rolly Salter, MD    Physical Exam: Vitals:   10/02/17 1930 10/02/17 2045 10/02/17 2115 10/02/17 2136  BP: 103/73 126/88 113/60   Pulse:  (!) 102 100   Resp: (!) 24 16 19    Temp:    (!) 97 F (36.1 C)  TempSrc:    Rectal  SpO2: 97%  93% 99%   Weight:      Height:          Constitutional: No apparent respiratory distress, appears uncomfortable  Eyes: PERTLA, lids and conjunctivae normal ENMT: Mucous membranes are dry. Posterior pharynx clear of any exudate or lesions.   Neck: normal, supple, no masses, no thyromegaly Respiratory: clear to auscultation bilaterally, no wheezing, no crackles. Normal respiratory effort.    Cardiovascular: S1 & S2 heard, regular rate and rhythm. Trace pretibial edema. Abdomen: No distension, soft, mild generalized tenderness without rebound pain or guarding. Bowel sounds active.  Musculoskeletal: no clubbing / cyanosis. No joint deformity upper and lower extremities.    Skin: no significant rashes, lesions, ulcers. Warm, dry, well-perfused. Neurologic: PERRL, no gross facial asymmetry. Sensation intact. Moving all extremities.  Psychiatric: Disoriented. Anxious.     Labs on Admission: I have personally reviewed following labs and imaging studies  CBC: Recent Labs  Lab 10/02/17 1953 10/02/17 2032  WBC  --  7.2  NEUTROABS  --  PENDING  HGB 11.9* 10.1*  HCT 35.0* 34.6*  MCV  --  107.8*  PLT  --  123*    Basic Metabolic Panel: Recent Labs  Lab 10/02/17 1953 10/02/17 2032  NA 136 139  K 6.8* 5.3*  CL 103 99*  CO2  --  24  GLUCOSE 84 84  BUN 75* 52*  CREATININE 8.00* 7.67*  CALCIUM  --  9.1   GFR: Estimated Creatinine Clearance: 6.6 mL/min (A) (by C-G formula based on SCr of 7.67 mg/dL (H)). Liver Function Tests: Recent Labs  Lab 10/02/17 2032  AST 53*  ALT 27  ALKPHOS 118  BILITOT 1.8*  PROT 8.0  ALBUMIN 2.0*   No results for input(s): LIPASE, AMYLASE in the last 168 hours. Recent Labs  Lab 10/02/17 2032  AMMONIA 59*   Coagulation Profile: No results for input(s): INR, PROTIME in the last 168 hours. Cardiac Enzymes: No results for input(s): CKTOTAL, CKMB, CKMBINDEX, TROPONINI in the last 168 hours. BNP (last 3 results) No results for input(s): PROBNP in the last 8760 hours. HbA1C: No results for input(s): HGBA1C in the last 72 hours. CBG: Recent Labs  Lab 10/02/17 2132  GLUCAP 56*   Lipid Profile: No results for input(s): CHOL, HDL, LDLCALC, TRIG, CHOLHDL, LDLDIRECT in the last 72 hours. Thyroid Function Tests: No results for input(s): TSH, T4TOTAL, FREET4, T3FREE, THYROIDAB in the last 72 hours. Anemia Panel: No results for input(s): VITAMINB12, FOLATE, FERRITIN, TIBC, IRON, RETICCTPCT in the last 72 hours. Urine analysis:    Component Value Date/Time   COLORURINE AMBER (A) 08/03/2014 1249   APPEARANCEUR CLOUDY (A) 08/03/2014 1249   LABSPEC 1.020 08/03/2014 1249   PHURINE 7.0 08/03/2014 1249   GLUCOSEU 500 (A) 08/03/2014 1249   HGBUR LARGE (A) 08/03/2014 1249   BILIRUBINUR MODERATE (A) 08/03/2014 1249   KETONESUR 15 (A) 08/03/2014 1249   PROTEINUR >300 (A) 08/03/2014 1249   UROBILINOGEN 4.0 (H) 08/03/2014 1249   NITRITE POSITIVE (A) 08/03/2014 1249   LEUKOCYTESUR TRACE (A) 08/03/2014 1249   Sepsis Labs: @LABRCNTIP (procalcitonin:4,lacticidven:4) )No results found for this or any previous visit (from the past 240 hour(s)).   Radiological  Exams on Admission: Dg Chest 2 View  Result Date: 10/02/2017 CLINICAL DATA:  Patient found on floor. Decreased urinary output. Patient missed dialysis on Monday. EXAM: CHEST - 2 VIEW COMPARISON:  06/26/2017 FINDINGS: Stable cardiomegaly with aortic atherosclerosis. No aneurysm. Right IJ dialysis catheter tip terminates in the right atrium. No overt pulmonary  edema, pulmonary consolidation or effusion. Minimal atelectasis at the left lung base. Partially included bilateral shoulder arthroplasties are redemonstrated without significant change. IMPRESSION: Stable mild cardiomegaly and aortic atherosclerosis. No active pulmonary disease. Electronically Signed   By: Tollie Eth M.D.   On: 10/02/2017 20:24   Ct Head Wo Contrast  Result Date: 10/02/2017 CLINICAL DATA:  Altered level of consciousness, patient has not been eating but only drinking Ensure. Decreased urinary output. EXAM: CT HEAD WITHOUT CONTRAST TECHNIQUE: Contiguous axial images were obtained from the base of the skull through the vertex without intravenous contrast. COMPARISON:  08/18/2016 FINDINGS: Brain: New confluent area of hypodensity involving the right high parietal lobe suspicious for recent infarct. No hemorrhagic component is identified. Stable mild superficial and moderate central atrophy is noted. No effacement of the basal cisterns or fourth ventricle. No intra-axial mass nor extra-axial fluid collections. Chronic small vessel ischemic disease of periventricular subcortical white matter. Vascular: Moderate atherosclerosis of the carotid siphons. Skull: No skull fracture or suspicious osseous lesions. Sinuses/Orbits: No acute paranasal sinus disease. Intact orbits. Bilateral lens replacements. Other: Small left mastoid effusion with partial opacification of the left mastoid. Osteoarthritis of the included TMJ though assessment is limited due to motion artifacts. IMPRESSION: 1. New area of hypodensity in the right parietal lobe suspicious  for a recent nonhemorrhagic infarct. 2. Chronic stable atrophy with small vessel ischemia. 3. Small left mastoid effusion. Electronically Signed   By: Tollie Eth M.D.   On: 10/02/2017 20:53    EKG: Independently reviewed. Sinus rhythm.   Assessment/Plan  1. Hyperkalemia; ESRD  - Presents with AMS after being found down at home - Potassium noted to be 6.8 on arrival to ED  - She missed her last HD session d/t AMS and was last dialyzed on 6/14  - Nephrology is arranging urgent HD and temporizing measures given in ED  - Continue cardiac monitoring and serial potassium levels until normalized    2. Acute encephalopathy  - Presents altered after being found down at home but has reportedly been altered for several days to weeks   - Found to have likely ischemic infarct on CT, ammonia elevated, hypoglycemic  - Treat ammonia with lactulose, follow CBG's and treat with IV dextrose, eval and mgmt of CVA as below, check TSH    3. 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 Hypothyroidism  - Check TSH given presentation with AMS  - Continue Synthroid    5. Hip pain; gait difficulty  - Patient has reportedly been bedbound since a fall 2 wks ago, complaining of hip pain  - Pelvis and bilateral hip pain with any LE movement in ED  - Check radiographs, continue prn analgesia, PT eval and tx   6. Hypoglycemia  - Serum glucose is 84 on arrival, CBG 50 later on in ED  - Likely secondary to critical illness, possible infection  - A1c was only 3.9% last year  - Treated with dextrose  - Check TSH and random cortisol  - Check frequent CBG's and use IV dextrose as needed   7. SIRS  - Tachycardia and elevated lactate noted on admission; temp 36.1 C; WBC still pending - CXR appears clear, abd benign, no meningismus   - Still makes urine, UA pending  - Cultured in ED and started  on empiric vancomycin and Zosyn  - Continue  abx pending UA and clinical course, can likely d/c if UA not suggestive of infection    DVT prophylaxis: SCD's  Code Status: DNR  Family Communication: Grandson updated at bedside Consults called: Nephrology Admission status: Inpatient    Briscoe Deutscher, MD [facility] Pager [phone]  If 7PM-7AM, please contact night-coverage [email or web address] Password [facility] [place]  10/02/2017, 10:23 PM

## 2017-10-02 NOTE — Progress Notes (Addendum)
Pharmacy Antibiotic Note  Pervis Hockingerlie M Weiss is a 72 y.o. female admitted on 10/02/2017 with sepsis.  Pharmacy has been consulted for vancomycin and zosyn dosing.  Pt came to ED after fall. Recent decreased dietary intake and missed HD on Monday (ESRD pt).   LA 3.42, afebrile, CBC pending, elevated potassium.   Plan: Vancomycin 2000 mg IV x 1 Schedule further vanc doses pending HD schedule Zosyn 3.375 g IV x 1 in ED, then Zosyn 3.375 g IV Q12h Monitor clinical progression, BCx, LOT and HD sch  Height: 5\' 4"  (162.6 cm) Weight: 170 lb (77.1 kg) IBW/kg (Calculated) : 54.7  No data recorded.  Recent Labs  Lab 10/02/17 1953  CREATININE 8.00*  LATICACIDVEN 3.42*    Estimated Creatinine Clearance: 6.3 mL/min (A) (by C-G formula based on SCr of 8 mg/dL (H)).    Allergies  Allergen Reactions  . Aspirin     Per Md.  . Banana Other (See Comments)    Due to dialysis  . Chocolate Other (See Comments)    Due to dialysis  . Fish-Derived Products Other (See Comments)    Due to gout    Antimicrobials this admission: 6/18 vancomycin>> 6/18 zosyn >>  Dose adjustments this admission:   Microbiology results: 6/18 BCx:

## 2017-10-02 NOTE — ED Notes (Signed)
Phlebotomy unable to get blood cultures, will start antibiotics at this time

## 2017-10-02 NOTE — ED Notes (Signed)
Pt transported top Xray

## 2017-10-02 NOTE — ED Notes (Signed)
Writer notified PA Harris of abnormal I stat lactic and chem 8 result.

## 2017-10-02 NOTE — ED Provider Notes (Signed)
[facility] EMERGENCY DEPARTMENT Provider Note   CSN: [id number] Arrival date & time: 10/02/17  1834     History   Chief Complaint Chief Complaint  Patient presents with  . Fall  . Altered Mental Status    HPI Samantha [name] is a 72 y.o. female.Who presents to the ED with cc of fall and AMS. The patient has a pmh of ESRD.  Patient dialyzes M/W/F and has not dialyzed since Friday, 09/28/2017.  The patient is cared for by her grandson who is at bedside and gives a history.  He states that stepped away out of the house to get the store for about 5 minutes and when he returned she was on the floor next to her bed.  He states this is a second time that he has brought her to the hospital for falling.  She was seen in the ER on 09/19/2017 for fall.  Her grandson states that she has had significant decline since that time.  He states that since then both her mental status and her physical ability has declined significantly.  Prior to the fall she was more alert and active, would have conversations with only mild confusion.  Since that time she has been extremely confused.  The patient has also been refusing to walk at all.  Prior to the fall he was able to help her to the bathroom and now she is lying in bed all the time and using diapers.  He states that her appetite has been extremely poor and she is only been willing to take some mild fluids by mouth and ensures..  According to EMS the patient has had altered mental status for the past several days.   HPI  Past Medical History:  Diagnosis Date  . Anemia   . Arthritis    left shoulder  . Cellulitis of left upper extremity   . Constipation   . Dementia   . Diabetes mellitus without complication (HCC)    borderline  . Dizziness   . Dry skin   . Gastric ulcer   . GERD (gastroesophageal reflux disease)   . GI bleed 06/06/2011  . Gram-negative bacteremia   . H/O: GI bleed   . Headache(784.0)    occasionally  . Hepatitis      Hep B  . History of blood transfusion   . History of kidney stones   . Hypertension   . Hypothyroidism    takes Synthroid daily  . Impaired hearing   . Joint pain   . Joint swelling   . Mechanical complication of other vascular device, implant, and graft 10/08/2013  . Occasional numbness/prickling/tingling of fingers and toes   . Oligouria   . Pancreatitis 04/10/2012  . Peripheral edema   . Renal disorder    m, w, F   . Secondary hyperparathyroidism (HCC) 08/19/2016  . Sepsis (HCC) 06/14/2015  . Shoulder pain   . Vaginal bleeding 08/15/2015    Patient Active Problem List   Diagnosis Date Noted  . S/P ORIF (open reduction internal fixation) fracture 02/20/2017  . 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 Secondary hyperparathyroidism (HCC) 08/19/2016  . Left supracondylar humerus fracture, closed, initial encounter 08/18/2016  . Adnexal mass   . ESRD on dialysis (HCC)   . 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 Thrombocytopenia (HCC) 12/25/2014  . 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 Cardiomegaly 12/25/2014  .  Hyponatremia 04/12/2012  . SOB (shortness of breath) 04/08/2012  . Symptomatic cholelithiasis 11/16/2011  . Transaminitis 06/08/2011  . Hepatitis B antibody positive 06/08/2011  . ESRD on hemodialysis (HCC) 06/06/2011  . HTN (hypertension) 06/06/2011  . Shoulder pain 06/06/2011  . Anemia in chronic kidney disease (CKD) 06/06/2011    Past Surgical History:  Procedure Laterality Date  . ARTERIOVENOUS GRAFT PLACEMENT Left    forearm  . cataract surgery     bilateral  . CHOLECYSTECTOMY  12/26/2011  . CHOLECYSTECTOMY  12/26/2011   Procedure: LAPAROSCOPIC CHOLECYSTECTOMY;  Surgeon: Atilano Ina, MD,FACS;  Location: [facility] OR;  Service: General;  Laterality: N/A;  . ESOPHAGOGASTRODUODENOSCOPY  06/09/2011   Procedure: ESOPHAGOGASTRODUODENOSCOPY (EGD);  Surgeon: Theda Belfast, MD;   Location: [facility] ENDOSCOPY;  Service: Endoscopy;  Laterality: N/A;  . I&D EXTREMITY Left 03/23/2015   Procedure: DRAINAGE OF LEFT ARM SEROMA;  Surgeon: Fransisco Hertz, MD;  Location: [facility] OR;  Service: Vascular;  Laterality: Left;  . INSERTION OF DIALYSIS CATHETER Right 01/12/2015   Procedure: INSERTION OF DIALYSIS CATHETER;  Surgeon: Fransisco Hertz, MD;  Location: [facility] OR;  Service: Vascular;  Laterality: Right;  . LIGATION ARTERIOVENOUS GORTEX GRAFT Left 03/23/2015   Procedure: LIGATION AND EXCISION OF LEFT ARTERIOVENOUS GORTEX GRAFT;  Surgeon: Fransisco Hertz, MD;  Location: [facility] OR;  Service: Vascular;  Laterality: Left;  . ORIF HUMERUS FRACTURE Left 02/20/2017   Procedure: LEFT HUMERUS OPEN REDUCTION INTERNAL FIXATION;  Surgeon: Beverely Low, MD;  Location: [facility] OR;  Service: Orthopedics;  Laterality: Left;  . REVERSE SHOULDER ARTHROPLASTY  09/22/2011   Procedure: REVERSE SHOULDER ARTHROPLASTY;  Surgeon: Verlee Rossetti, MD;  Location: [facility] OR;  Service: Orthopedics;  Laterality: Left;  left reverse shoulder arthroplasty  . REVISION OF ARTERIOVENOUS GORETEX GRAFT Left 01/12/2015   Procedure: REVISION OF Left FOREARM ARTERIOVENOUS GORETEX GRAFT;  Surgeon: Fransisco Hertz, MD;  Location: [facility] OR;  Service: Vascular;  Laterality: Left;  . SHOULDER SURGERY  3-59yrs ago   right replacement  . SHUNTOGRAM Left 08/29/2012   Procedure: SHUNTOGRAM;  Surgeon: Fransisco Hertz, MD;  Location: [facility] CATH LAB;  Service: Cardiovascular;  Laterality: Left;  arm     OB History   None      Home Medications    Prior to Admission medications   Medication Sig Start Date End Date Taking? Authorizing Provider  acetaminophen (TYLENOL) 650 MG CR tablet Take 650 mg by mouth every 8 (eight) hours as needed for pain.    [provider]  dicyclomine (BENTYL) 20 MG tablet Take 20 mg by mouth 3 (three) times daily. 08/21/17   [provider]  HYDROcodone-acetaminophen (NORCO) 5-325 MG tablet Take 1 tablet every 6 (six) hours as needed  by mouth for moderate pain. Patient not taking: Reported on 09/19/2017 02/20/17   Beverely Low, MD  HYDROcodone-acetaminophen (NORCO/VICODIN) 5-325 MG tablet Take 1 tablet by mouth every 6 (six) hours as needed for moderate pain. 08/20/15   Leatha Gilding, MD  levothyroxine (SYNTHROID, LEVOTHROID) 50 MCG tablet Take 50 mcg by mouth daily before breakfast.  04/15/15   [provider]  Multiple Vitamins-Minerals (MULTIVITAMIN ADULT PO) Take 1 tablet by mouth daily.    [provider]  pantoprazole (PROTONIX) 40 MG tablet Take 1 tablet (40 mg total) by mouth 2 (two) times daily before a meal. Patient taking differently: Take 40 mg by mouth daily.  06/18/15   Rolly Salter, MD  senna-docusate (SENOKOT-S) 8.6-50 MG tablet Take 1  tablet by mouth at bedtime as needed for mild constipation. Patient not taking: Reported on 09/19/2017 08/20/16   Rama, Maryruth Bunhristina P, MD  sucralfate (CARAFATE) 1 GM/10ML suspension Take 10 mLs (1 g total) by mouth 2 (two) times daily as needed. Patient not taking: Reported on 09/11/2017 07/22/17   Tilden Fossaees, Elizabeth, MD    Family History Family History  Problem Relation Age of Onset  . Hypertension Mother   . Diabetes Mother   . Cancer Brother     Social History Social History   Tobacco Use  . Smoking status: Never Smoker  . Smokeless tobacco: Current User    Types: Chew  Substance Use Topics  . Alcohol use: No    Alcohol/week: 0.0 oz    Comment: quit 4-7258yrs ago  . Drug use: No     Allergies   Aspirin; Banana; Chocolate; and Fish-derived products   Review of Systems Review of Systems  Unable to perform ROS: Dementia     Physical Exam Updated Vital Signs BP 115/72 (BP Location: Right Arm)   Pulse 91   Resp 18   Ht 5\' 4"  (1.626 m)   Wt 77.1 kg (170 lb)   SpO2 97%   BMI 29.18 kg/m   Physical Exam  Constitutional: She appears well-developed. She appears ill.  Appears chronically ill  HENT:  Head: Normocephalic and atraumatic.  Eyes:  Pupils are equal, round, and reactive to light. Conjunctivae and EOM are normal.  Neck: No JVD present.  Cardiovascular: Normal rate and regular rhythm.  Pulmonary/Chest:  Dialysis catheter present in the R upper chest wall  Abdominal: Soft. There is tenderness (Diffuse tenderness).  Neurological: She is alert.  Skin: Skin is warm and dry. Capillary refill takes less than 2 seconds.  Nursing note and vitals reviewed.    ED Treatments / Results  Labs (all labs ordered are listed, but only abnormal results are displayed) Labs Reviewed  CBC WITH DIFFERENTIAL/PLATELET  COMPREHENSIVE METABOLIC PANEL  URINALYSIS, ROUTINE W REFLEX MICROSCOPIC  AMMONIA  I-STAT CHEM 8, ED  CBG MONITORING, ED  I-STAT CG4 LACTIC ACID, ED    EKG EKG Interpretation  Date/Time:  Tuesday October 02 2017 18:42:42 EDT Ventricular Rate:  94 PR Interval:    QRS Duration: 87 QT Interval:  361 QTC Calculation: 452 R Axis:   34 Text Interpretation:  Sinus rhythm Normal sinus rhythm Confirmed by Corlis LeakMackuen, Courteney ([id number]) on 10/02/2017 7:58:10 PM   Radiology No results found.  Procedures .Critical Care Performed by: Arthor CaptainHarris, Claudina Oliphant, PA-C Authorized by: Arthor CaptainHarris, Chaniyah Jahr, PA-C   Critical care provider statement:    Critical care time (minutes):  50   Critical care was necessary to treat or prevent imminent or life-threatening deterioration of the following conditions:  Renal failure and sepsis   Critical care was time spent personally by me on the following activities:  Development of treatment plan with patient or surrogate, discussions with consultants, evaluation of patient's response to treatment, examination of patient, ordering and performing treatments and interventions, review of old charts, re-evaluation of patient's condition, ordering and review of radiographic studies, ordering and review of laboratory studies and pulse oximetry   (including critical care time)  Medications Ordered in  ED Medications - No data to display   Initial Impression / Assessment and Plan / ED Course  I have reviewed the triage vital signs and the nursing notes.  Pertinent labs & imaging results that were available during my care of the patient were reviewed by me and considered  in my medical decision making (see chart for details).  Clinical Course as of Oct 03 2311  Tue Oct 02, 2017  2003 Patient with hyperkalemia. Temporizing agents initiated.  Will need emergent dialysis.    [AH]  2004 Lactic acid elevated at 3.42. Will not admin fluids at this point as the patient will need dialysis.    [AH]  2021 I spoke with Dr. Lowell Guitar of the renal service who is aware of the patient and her emergent need for dialysis.   [AH]  2022 EKG without peaked t waves.   [AH]    Clinical Course User Index [AH] Arthor Captain, PA-C    Patient with acute hyperkalemia in the setting of end-stage renal disease, suspected sepsis of unknown source.  Patient treated emergently with calcium gluconate, dextrose, insulin, and bicarb.  Patient was found to be hypoglycemic to 50 and given another amp of D50.  Patient will be admitted by the hospitalist service.  I also have concern for potential underlying pelvic fracture as a cause of her ambulatory change in have advised Dr. Antionette Char of my concern. Patient will be taken for admission.   Final Clinical Impressions(s) / ED Diagnoses   Final diagnoses:  Acute hyperkalemia  Sepsis, due to unspecified organism [facility])    ED Discharge Orders    None       Arthor Captain, PA-C 10/02/17 2316    Abelino Derrick, MD 10/02/17 2358

## 2017-10-03 ENCOUNTER — Inpatient Hospital Stay (HOSPITAL_COMMUNITY): Payer: Medicare Other

## 2017-10-03 DIAGNOSIS — N183 Chronic kidney disease, stage 3 (moderate): Secondary | ICD-10-CM

## 2017-10-03 DIAGNOSIS — I63011 Cerebral infarction due to thrombosis of right vertebral artery: Secondary | ICD-10-CM

## 2017-10-03 DIAGNOSIS — I340 Nonrheumatic mitral (valve) insufficiency: Secondary | ICD-10-CM

## 2017-10-03 DIAGNOSIS — D631 Anemia in chronic kidney disease: Secondary | ICD-10-CM

## 2017-10-03 DIAGNOSIS — I639 Cerebral infarction, unspecified: Secondary | ICD-10-CM

## 2017-10-03 LAB — CORTISOL: CORTISOL PLASMA: 15.3 ug/dL

## 2017-10-03 LAB — CBC WITH DIFFERENTIAL/PLATELET
Abs Immature Granulocytes: 0.2 10*3/uL — ABNORMAL HIGH (ref 0.0–0.1)
BASOS ABS: 0.1 10*3/uL (ref 0.0–0.1)
BASOS PCT: 2 %
EOS ABS: 0 10*3/uL (ref 0.0–0.7)
EOS PCT: 0 %
HEMATOCRIT: 28.3 % — AB (ref 36.0–46.0)
Hemoglobin: 8.6 g/dL — ABNORMAL LOW (ref 12.0–15.0)
Immature Granulocytes: 4 %
Lymphocytes Relative: 13 %
Lymphs Abs: 0.8 10*3/uL (ref 0.7–4.0)
MCH: 31.7 pg (ref 26.0–34.0)
MCHC: 30.4 g/dL (ref 30.0–36.0)
MCV: 104.4 fL — ABNORMAL HIGH (ref 78.0–100.0)
Monocytes Absolute: 0.7 10*3/uL (ref 0.1–1.0)
Monocytes Relative: 11 %
Neutro Abs: 4.3 10*3/uL (ref 1.7–7.7)
Neutrophils Relative %: 70 %
PLATELETS: 127 10*3/uL — AB (ref 150–400)
RBC: 2.71 MIL/uL — ABNORMAL LOW (ref 3.87–5.11)
RDW: 20.6 % — AB (ref 11.5–15.5)
WBC: 6 10*3/uL (ref 4.0–10.5)

## 2017-10-03 LAB — COMPREHENSIVE METABOLIC PANEL
ALT: 24 U/L (ref 14–54)
AST: 42 U/L — AB (ref 15–41)
Albumin: 1.9 g/dL — ABNORMAL LOW (ref 3.5–5.0)
Alkaline Phosphatase: 110 U/L (ref 38–126)
Anion gap: 14 (ref 5–15)
BILIRUBIN TOTAL: 2.1 mg/dL — AB (ref 0.3–1.2)
BUN: 55 mg/dL — AB (ref 6–20)
CHLORIDE: 98 mmol/L — AB (ref 101–111)
CO2: 26 mmol/L (ref 22–32)
CREATININE: 7.96 mg/dL — AB (ref 0.44–1.00)
Calcium: 9.4 mg/dL (ref 8.9–10.3)
GFR, EST AFRICAN AMERICAN: 5 mL/min — AB (ref >60)
GFR, EST NON AFRICAN AMERICAN: 4 mL/min — AB (ref >60)
Glucose, Bld: 122 mg/dL — ABNORMAL HIGH (ref 65–99)
Potassium: 4.8 mmol/L (ref 3.5–5.1)
Sodium: 138 mmol/L (ref 135–145)
TOTAL PROTEIN: 7.5 g/dL (ref 6.5–8.1)

## 2017-10-03 LAB — CBG MONITORING, ED
GLUCOSE-CAPILLARY: 137 mg/dL — AB (ref 65–99)
GLUCOSE-CAPILLARY: 114 mg/dL — AB (ref 65–99)
Glucose-Capillary: 137 mg/dL — ABNORMAL HIGH (ref 65–99)
Glucose-Capillary: 86 mg/dL (ref 65–99)

## 2017-10-03 LAB — HEMOGLOBIN A1C
Hgb A1c MFr Bld: 4 % — ABNORMAL LOW (ref 4.8–5.6)
Mean Plasma Glucose: 68.1 mg/dL

## 2017-10-03 LAB — TSH: TSH: 24.119 u[IU]/mL — AB (ref 0.350–4.500)

## 2017-10-03 LAB — VITAMIN B12: Vitamin B-12: 2130 pg/mL — ABNORMAL HIGH (ref 180–914)

## 2017-10-03 LAB — LIPID PANEL
CHOL/HDL RATIO: 20.1 ratio
CHOLESTEROL: 201 mg/dL — AB (ref 0–200)
HDL: 10 mg/dL — ABNORMAL LOW (ref >40)
LDL CALC: 167 mg/dL — AB (ref 0–99)
Triglycerides: 120 mg/dL (ref <150)
VLDL: 24 mg/dL (ref 0–40)

## 2017-10-03 LAB — POTASSIUM
Potassium: 4.8 mmol/L (ref 3.5–5.1)
Potassium: 4.7 mmol/L (ref 3.5–5.1)

## 2017-10-03 LAB — PATHOLOGIST SMEAR REVIEW

## 2017-10-03 LAB — POC OCCULT BLOOD, ED: FECAL OCCULT BLD: POSITIVE — AB

## 2017-10-03 LAB — LACTIC ACID, PLASMA: Lactic Acid, Venous: 1.8 mmol/L (ref 0.5–1.9)

## 2017-10-03 LAB — RPR: RPR Ser Ql: NONREACTIVE

## 2017-10-03 LAB — T4, FREE: Free T4: 1.18 ng/dL (ref 0.82–1.77)

## 2017-10-03 MED ORDER — ALTEPLASE 2 MG IJ SOLR: 2.0000 mg | Freq: Once | INTRAMUSCULAR | Status: DC | PRN

## 2017-10-03 MED ORDER — VANCOMYCIN HCL IN DEXTROSE 750-5 MG/150ML-% IV SOLN
750.0000 mg | INTRAVENOUS | Status: AC
  Administered 2017-10-03: 750 mg via INTRAVENOUS
  Filled 2017-10-03: qty 150

## 2017-10-03 MED ORDER — HEPARIN SODIUM (PORCINE) 1000 UNIT/ML DIALYSIS: 1000.0000 [IU] | INTRAMUSCULAR | Status: DC | PRN

## 2017-10-03 MED ORDER — LIDOCAINE HCL (PF) 1 % IJ SOLN: 5.0000 mL | INTRAMUSCULAR | Status: DC | PRN

## 2017-10-03 MED ORDER — VANCOMYCIN HCL IN DEXTROSE 750-5 MG/150ML-% IV SOLN
INTRAVENOUS | Status: AC
  Filled 2017-10-03: qty 150

## 2017-10-03 MED ORDER — LACTULOSE ENEMA
300.0000 mL | Freq: Once | ORAL | Status: AC
  Administered 2017-10-03: 300 mL via RECTAL
  Filled 2017-10-03: qty 300

## 2017-10-03 MED ORDER — SODIUM CHLORIDE 0.9 % IV SOLN: 100.0000 mL | INTRAVENOUS | Status: DC | PRN

## 2017-10-03 MED ORDER — HEPARIN SODIUM (PORCINE) 1000 UNIT/ML DIALYSIS: 20.0000 [IU]/kg | INTRAMUSCULAR | Status: DC | PRN

## 2017-10-03 MED ORDER — LIDOCAINE-PRILOCAINE 2.5-2.5 % EX CREA: 1.0000 "application " | TOPICAL_CREAM | CUTANEOUS | Status: DC | PRN

## 2017-10-03 MED ORDER — PENTAFLUOROPROP-TETRAFLUOROETH EX AERO: 1.0000 "application " | INHALATION_SPRAY | CUTANEOUS | Status: DC | PRN

## 2017-10-03 NOTE — ED Notes (Signed)
Spoke with lab in regards to labs that were acquired  , lab recommends redrawing labs since labs were off and probably due to unreliable/hemolyzied specimen

## 2017-10-03 NOTE — ED Notes (Signed)
DNR bracelet applied to right wrist.

## 2017-10-03 NOTE — Progress Notes (Signed)
New Admission Note:  Arrival Method:on stretcher  Mental Orientation: disoriented x 4 Telemetry: bedside monitor Assessment: Completed Skin: patient has a stage 3 on buttocks bilaterally; edema in all extremities IV: R hand Safety Measures: Safety Fall Prevention Plan was given, discussed. [id number]: Patient has been orientated to the room, unit and the staff. Family: grandson at the bedside   Orders have been reviewed and implemented. Will continue to monitor the patient. Call light has been placed within reach and bed alarm has been activated.   Lawernce IonYari Nikeisha Klutz ,RN

## 2017-10-03 NOTE — ED Notes (Signed)
Called dialysis. RN stated pt scheduled for dialysis at approx 11am

## 2017-10-03 NOTE — ED Notes (Signed)
Phlebotomy attempted to stick pt unsuccessfully. Unable to get labs.

## 2017-10-03 NOTE — Consult Note (Addendum)
I have seen and examined this patient and agree with the plan of care      I would agree that palliative medicine may be appropriate   Royston Bekele W 10/03/2017, 11:04 AM [facility] Renal Consultation Note    Indication for Consultation:  Management of ESRD/hemodialysis, anemia, hypertension/volume, and secondary hyperparathyroidism. PCP:  HPI: Samantha [name] is a 72 y.o. female with ESRD, HTN, chronic dementia, Type 2 DM, hypothyroidism, and alcoholic cirrhosis with recurrent GI bleeding episodes who was admitted for CVA and sepsis.  Samantha [name] was seen in ED. She in non-communicative with me at this time, will look at me but not consistently even tracking her eyes or following commands. Per notes, brought in via EMS 6/18 evening after fall out of bed and AMS. Found on floor by her grandson (her POA/caretaker) after he went to the store for ~10 minutes). S/p another fall recently where she was evaluated in the ED on 6/5. Since that time, she has not been her usual self and has been essentially bed bound. C/o pain in B hips and arm (no fractures). Per notes, has not been eating much. In ED, labs initially showed high K (likely hemolyzed - repeat was ok), Na 139, Ca 9.1, Alb 2, WBC 7.2, Hgb 10.1, LA ^ 3.42, ^ TSH 24, + FOBT. CXR clear. Head CT + for new R parietal ischemic infarct. BCx drawn, starting on abx for concern of sepsis and brain MRI ordered. RN reports sacral wound present.  Usually dial[facility]zes MWF [facility]. Last HD 6/14 which she completed. In general, her health has been declining recently and her EDW has been lowered 6.5kg within the past few weeks (from 65.5kg on 5/22 to 59kg on 6/14). Currently using a R TDC as her access.  Past Medical History:  Diagnosis Date  . Anemia   . Arthritis    left shoulder  . Cellulitis of left upper extremity   . Constipation   . Dementia   . Diabetes mellitus without complication (HCC)    borderline  . Dizziness   .  Dry skin   . Gastric ulcer   . GERD (gastroesophageal reflux disease)   . GI bleed 06/06/2011  . Gram-negative bacteremia   . H/O: GI bleed   . Headache(784.0)    occasionally  . Hepatitis    Hep B  . History of blood transfusion   . History of kidney stones   . Hypertension   . Hypothyroidism    takes Synthroid daily  . Impaired hearing   . Joint pain   . Joint swelling   . Mechanical complication of other vascular device, implant, and graft 10/08/2013  . Occasional numbness/prickling/tingling of fingers and toes   . Oligouria   . Pancreatitis 04/10/2012  . Peripheral edema   . Renal disorder    m, w, F   . Secondary hyperparathyroidism (HCC) 08/19/2016  . Sepsis (HCC) 06/14/2015  . Shoulder pain   . Vaginal bleeding 08/15/2015   Past Surgical History:  Procedure Laterality Date  . ARTERIOVENOUS GRAFT PLACEMENT Left    forearm  . cataract surgery     bilateral  . CHOLECYSTECTOMY  12/26/2011  . CHOLECYSTECTOMY  12/26/2011   Procedure: LAPAROSCOPIC CHOLECYSTECTOMY;  Surgeon: Atilano InaEric M Wilson, MD,FACS;  Location: [facility] OR;  Service: General;  Laterality: N/A;  . ESOPHAGOGASTRODUODENOSCOPY  06/09/2011   Procedure: ESOPHAGOGASTRODUODENOSCOPY (EGD);  Surgeon: Theda BelfastPatrick D Hung, MD;  [facility];  Service: Endoscopy;  Laterality: N/A;  .  I&D EXTREMITY Left 03/23/2015   Procedure: DRAINAGE OF LEFT ARM SEROMA;  Surgeon: Fransisco Hertz, MD;  Location: [facility] OR;  Service: Vascular;  Laterality: Left;  . INSERTION OF DIALYSIS CATHETER Right 01/12/2015   Procedure: INSERTION OF DIALYSIS CATHETER;  Surgeon: Fransisco Hertz, MD;  Location: [facility] OR;  Service: Vascular;  Laterality: Right;  . LIGATION ARTERIOVENOUS GORTEX GRAFT Left 03/23/2015   Procedure: LIGATION AND EXCISION OF LEFT ARTERIOVENOUS GORTEX GRAFT;  Surgeon: Fransisco Hertz, MD;  Location: [facility] OR;  Service: Vascular;  Laterality: Left;  . ORIF HUMERUS FRACTURE Left 02/20/2017   Procedure: LEFT HUMERUS OPEN REDUCTION INTERNAL FIXATION;  Surgeon:  Beverely Low, MD;  Location: [facility] OR;  Service: Orthopedics;  Laterality: Left;  . REVERSE SHOULDER ARTHROPLASTY  09/22/2011   Procedure: REVERSE SHOULDER ARTHROPLASTY;  Surgeon: Verlee Rossetti, MD;  Location: [facility] OR;  Service: Orthopedics;  Laterality: Left;  left reverse shoulder arthroplasty  . REVISION OF ARTERIOVENOUS GORETEX GRAFT Left 01/12/2015   Procedure: REVISION OF Left FOREARM ARTERIOVENOUS GORETEX GRAFT;  Surgeon: Fransisco Hertz, MD;  Location: [facility] OR;  Service: Vascular;  Laterality: Left;  . SHOULDER SURGERY  3-33yrs ago   right replacement  . SHUNTOGRAM Left 08/29/2012   Procedure: SHUNTOGRAM;  Surgeon: Fransisco Hertz, MD;  Location: [facility] CATH LAB;  Service: Cardiovascular;  Laterality: Left;  arm   Family History  Problem Relation Age of Onset  . Hypertension Mother   . Diabetes Mother   . Cancer Brother    Social History:  reports that she has never smoked. Her smokeless tobacco use includes chew. She reports that she does not drink alcohol or use drugs.  ROS: Unable to obtain d/t AMS.  Physical Exam: Vitals:   10/03/17 0700 10/03/17 0715 10/03/17 0730 10/03/17 0826  BP: 133/84 132/75 (!) 131/93 (!) 127/56  Pulse: 82 79 78 95  Resp:    18  Temp:      TempSrc:      SpO2: 99% 100% 99% 91%  Weight:      Height:         General: Obtunded, non-verbal to my questioning. Head: + facial/supraorbital edema present Neck: Supple without lymphadenopathy/masses. Lungs: Roughly clear anteriorly, she would not take deep breaths for exam. Heart: RRR; 2/6 murmur present Abdomen: Soft, moaning with palpation of lower abdomen. Lower extremities: 2+ LE edema. No obviously wounds. Neuro: Awake, but obtunded - not oriented. Dialysis Access: TDC in R chest, no surrounding erythema.  Allergies  Allergen Reactions  . Aspirin Other (See Comments)    Per Md.  . Banana Other (See Comments)    Due to dialysis  . Chocolate Other (See Comments)    Due to dialysis  . Fish-Derived Products  Other (See Comments)    Due to gout   Prior to Admission medications   Medication Sig Start Date End Date Taking? Authorizing Provider  acetaminophen (TYLENOL) 650 MG CR tablet Take 650 mg by mouth every 8 (eight) hours as needed for pain.   Yes [provider]  dicyclomine (BENTYL) 20 MG tablet Take 20 mg by mouth 3 (three) times daily. 08/21/17  Yes [provider]  levothyroxine (SYNTHROID, LEVOTHROID) 50 MCG tablet Take 50 mcg by mouth daily before breakfast.  04/15/15  Yes [provider]  Multiple Vitamins-Minerals (MULTIVITAMIN ADULT PO) Take 1 tablet by mouth daily.   Yes [provider]  pantoprazole (PROTONIX) 40 MG tablet Take 1 tablet (40 mg total) by mouth 2 (two)  times daily before a meal. Patient taking differently: Take 40 mg by mouth daily.  06/18/15  Yes Rolly Salter, MD   Current Facility-Administered Medications  Medication Dose Route Frequency Provider Last Rate Last Dose  .  stroke: mapping our early stages of recovery book   Does not apply Once Opyd, Timothy S, MD      . 0.9 %  sodium chloride infusion  250 mL Intravenous PRN Opyd, Lavone Neri, MD      . acetaminophen (TYLENOL) tablet 650 mg  650 mg Oral Q6H PRN Opyd, Lavone Neri, MD       Or  . acetaminophen (TYLENOL) suppository 650 mg  650 mg Rectal Q6H PRN Opyd, Lavone Neri, MD      . Chlorhexidine Gluconate Cloth 2 % PADS 6 each  6 each Topical Q0600 Opyd, Lavone Neri, MD      . fentaNYL (SUBLIMAZE) injection 12.5-25 mcg  12.5-25 mcg Intravenous Q2H PRN Opyd, Lavone Neri, MD      . levothyroxine (SYNTHROID, LEVOTHROID) tablet 50 mcg  50 mcg Oral QAC breakfast Opyd, Lavone Neri, MD   Stopped at 10/03/17 [phone]  . ondansetron (ZOFRAN) tablet 4 mg  4 mg Oral Q6H PRN Opyd, Lavone Neri, MD       Or  . ondansetron (ZOFRAN) injection 4 mg  4 mg Intravenous Q6H PRN Opyd, Lavone Neri, MD      . piperacillin-tazobactam (ZOSYN) IVPB 3.375 g  3.375 g Intravenous Q12H Opyd, Lavone Neri, MD 12.5 mL/hr at 10/03/17  1001 3.375 g at 10/03/17 1001  . sodium chloride flush (NS) 0.9 % injection 3 mL  3 mL Intravenous Q12H Opyd, Timothy S, MD      . sodium chloride flush (NS) 0.9 % injection 3 mL  3 mL Intravenous Q12H Opyd, Timothy S, MD      . sodium chloride flush (NS) 0.9 % injection 3 mL  3 mL Intravenous PRN Opyd, Lavone Neri, MD      . vancomycin (VANCOCIN) IVPB 750 mg/150 ml premix  750 mg Intravenous Q Wed-HD Mosetta Anis, [facility]       Current Outpatient Medications  Medication Sig Dispense Refill  . acetaminophen (TYLENOL) 650 MG CR tablet Take 650 mg by mouth every 8 (eight) hours as needed for pain.    Marland Kitchen dicyclomine (BENTYL) 20 MG tablet Take 20 mg by mouth 3 (three) times daily.  2  . levothyroxine (SYNTHROID, LEVOTHROID) 50 MCG tablet Take 50 mcg by mouth daily before breakfast.     . Multiple Vitamins-Minerals (MULTIVITAMIN ADULT PO) Take 1 tablet by mouth daily.    . pantoprazole (PROTONIX) 40 MG tablet Take 1 tablet (40 mg total) by mouth 2 (two) times daily before a meal. (Patient taking differently: Take 40 mg by mouth daily. ) 30 tablet 0   Labs: Basic Metabolic Panel: Recent Labs  Lab 10/02/17 1953 10/02/17 2032 10/03/17 0617  NA 136 139 138  K 6.8* 5.3* 4.8  CL 103 99* 98*  CO2  --  24 26  GLUCOSE 84 84 122*  BUN 75* 52* 55*  CREATININE 8.00* 7.67* 7.96*  CALCIUM  --  9.1 9.4   Liver Function Tests: Recent Labs  Lab 10/02/17 2032 10/03/17 0617  AST 53* 42*  ALT 27 24  ALKPHOS 118 110  BILITOT 1.8* 2.1*  PROT 8.0 7.5  ALBUMIN 2.0* 1.9*   Recent Labs  Lab 10/02/17 2032  AMMONIA 59*   CBC: Recent Labs  Lab 10/02/17 1953  10/02/17 2032  WBC  --  7.2  NEUTROABS  --  5.4  HGB 11.9* 10.1*  HCT 35.0* 34.6*  MCV  --  107.8*  PLT  --  123*   CBG: Recent Labs  Lab 10/02/17 2132 10/02/17 2222 10/03/17 0206 10/03/17 0559 10/03/17 0749  GLUCAP 56* 50* 137* 137* 114*   Studies/Results: Dg Chest 2 View  Result Date: 10/02/2017 CLINICAL DATA:  Patient found  on floor. Decreased urinary output. Patient missed dialysis on Monday. EXAM: CHEST - 2 VIEW COMPARISON:  06/26/2017 FINDINGS: Stable cardiomegaly with aortic atherosclerosis. No aneurysm. Right IJ dialysis catheter tip terminates in the right atrium. No overt pulmonary edema, pulmonary consolidation or effusion. Minimal atelectasis at the left lung base. Partially included bilateral shoulder arthroplasties are redemonstrated without significant change. IMPRESSION: Stable mild cardiomegaly and aortic atherosclerosis. No active pulmonary disease. Electronically Signed   By: Tollie Eth M.D.   On: 10/02/2017 20:24   Ct Head Wo Contrast  Result Date: 10/02/2017 CLINICAL DATA:  Altered level of consciousness, patient has not been eating but only drinking Ensure. Decreased urinary output. EXAM: CT HEAD WITHOUT CONTRAST TECHNIQUE: Contiguous axial images were obtained from the base of the skull through the vertex without intravenous contrast. COMPARISON:  08/18/2016 FINDINGS: Brain: New confluent area of hypodensity involving the right high parietal lobe suspicious for recent infarct. No hemorrhagic component is identified. Stable mild superficial and moderate central atrophy is noted. No effacement of the basal cisterns or fourth ventricle. No intra-axial mass nor extra-axial fluid collections. Chronic small vessel ischemic disease of periventricular subcortical white matter. Vascular: Moderate atherosclerosis of the carotid siphons. Skull: No skull fracture or suspicious osseous lesions. Sinuses/Orbits: No acute paranasal sinus disease. Intact orbits. Bilateral lens replacements. Other: Small left mastoid effusion with partial opacification of the left mastoid. Osteoarthritis of the included TMJ though assessment is limited due to motion artifacts. IMPRESSION: 1. New area of hypodensity in the right parietal lobe suspicious for a recent nonhemorrhagic infarct. 2. Chronic stable atrophy with small vessel ischemia. 3.  Small left mastoid effusion. Electronically Signed   By: Tollie Eth M.D.   On: 10/02/2017 20:53   Dg Hips Bilat With Pelvis 2v  Result Date: 10/02/2017 CLINICAL DATA:  72 year old female status post fall last week, and today. Found down. EXAM: DG HIP (WITH OR WITHOUT PELVIS) 2V BILAT COMPARISON:  CT Abdomen and Pelvis 07/22/2017. FINDINGS: Portable AP and frog-leg lateral views of the pelvis. The femoral heads are normally located and hip joint spaces are normal for age. Both proximal femurs appear intact. No pelvis fracture identified. Stool ball in the rectum. Iliofemoral calcified atherosclerosis. IMPRESSION: No hip fracture or acute osseous abnormality identified. Electronically Signed   By: Odessa Fleming M.D.   On: 10/02/2017 22:59   Dialysis Orders:  MWF at [facility] 4hr, 400/800, 3K/2.25Ca, EDW 59.5kg, UF #2/linear Na, TDC, no heparin. - Mircera IV q 2 weeks (last given 09/24/17) - No VDRA/venofer.  Assessment/Plan: 1.  New R parietal CVA: MRI pending. Plan per primary team. 2.  AMS: Ongoing for past 2 weeks per note, worsening. Multiple causes possible: CVA, infection, hepatic encephalopathy, etc. 3. ?Sepsis: ^ Lactate and tachycardic. BCx drawn and started empirically on Vanc/Zosyn. 4.  ESRD: Missed last HD, will plan on HD today and then per MWF schedule. No heparin. 5.  Hypertension/volume: BP ok, + visible edema on exam. Has been losing actually body weight rapidly over the past few weeks - EDW has been dropped ~ 6kg  within the past weeks and + edema today indicating that it needs to be dropped further. UF as tolerated. 6.  Anemia of CKD, + FOBT: Hgb 10.1, next due for ESA dosing on 6/24. + FOBT. Follow closely and transfuse prn. 7.  Metabolic bone disease: Ca ok, no binders for now. Follow. 8.  Nutrition:  Alb 1.9, very low in setting of not eating and acute illness. 9.  Hx alcoholic cirrhosis: Ammonia ^, continue lactulose. 10.  Hypothyroidism: TSH high, per primary. 11.  Type 2 DM:  Hypoglycemic at times, per primary. 12.  Dispo: Chronic medical decline and now with acute illness/fall/CVA compounding it. Will plan to dialyze today and treat possible infections, but if she doesn't perk up quickly then we soon will need to consider palliative options.  Ozzie Hoyle, PA-C 10/03/2017, 10:15 AM  [employer] Pager: [phone]

## 2017-10-03 NOTE — ED Notes (Signed)
Spoke with wendy from speech therapy ; will contact speech therapist to come down and evaluate pt today

## 2017-10-03 NOTE — ED Notes (Signed)
Speech therapy to see pt.  Pt at vascular.

## 2017-10-03 NOTE — ED Notes (Signed)
Pt to vascular.

## 2017-10-03 NOTE — ED Notes (Signed)
Spoke with dr Joseph ArtWoods in regards to + hemoccult and in regards to  Having a hard time getting lab work; pt cbc order in and no further orders received

## 2017-10-03 NOTE — Progress Notes (Signed)
  Echocardiogram 2D Echocardiogram has been performed.  Samantha [name] 10/03/2017, 10:57 AM

## 2017-10-03 NOTE — Progress Notes (Signed)
Pharmacy Antibiotic Note  Samantha [name] is a 72 y.o. female admitted on 10/02/2017 with sepsis.  Pharmacy has been consulted for vancomycin and zosyn dosing. Pt is ESRD on HD MWF - planning to dialyze today.  Plan: -Continue Zosyn 3.375g IV q12h -Vancomycin 750mg  IV x1 today with HD -F/U HD schedule for further vancomycin dosing -Monitor LOT, cultures, vancomycin level as indicated  Height: 5\' 4"  (162.6 cm) Weight: 170 lb (77.1 kg) IBW/kg (Calculated) : 54.7  Temp (24hrs), Avg:97 F (36.1 C), Min:97 F (36.1 C), Max:97 F (36.1 C)  Recent Labs  Lab 10/02/17 1953 10/02/17 2032  WBC  --  7.2  CREATININE 8.00* 7.67*  LATICACIDVEN 3.42*  --     Estimated Creatinine Clearance: 6.6 mL/min (A) (by C-G formula based on SCr of 7.67 mg/dL (H)).    Allergies  Allergen Reactions  . Aspirin Other (See Comments)    Per Md.  . Banana Other (See Comments)    Due to dialysis  . Chocolate Other (See Comments)    Due to dialysis  . Fish-Derived Products Other (See Comments)    Due to gout    Antimicrobials this admission: 6/18 vancomycin>> 6/18 zosyn >>  Dose adjustments this admission: none  Microbiology results: 6/18 BCx: sent   Fredonia HighlandMichael Bitonti, PharmD, BCPS PGY-2 Cardiology Pharmacy Resident Clinical Phone: [phone] 10/03/2017

## 2017-10-03 NOTE — ED Notes (Signed)
Flexi-Seal Placed. 

## 2017-10-03 NOTE — Progress Notes (Signed)
PROGRESS NOTE    Samantha [name]  WUJ:[id number] DOB: [birth year] DOA: 10/02/2017 PCP: Fleet ContrasAvbuere, Edwin, MD   Brief Narrative:  72 y.o. BF PMHx Dementia, ESRD on HD M/W/F  Hypothyroidism, pancreatitis, hepatitis B, diabetes type II uncontrolled with complication, GI bleed, nephrolithiasis, HTN,recent fall, and decline in her physical and cognitive status over the past 2 weeks or so, now  Presenting to the emergency department with altered mental status after being found on the ground at home.  Patient lives with her grandson who reports that she fell approximately 2 weeks ago, was evaluated in the emergency department with negative radiographs at that time, but has been bedbound since then, complaining of hip and pelvic pain.  Patient has been eating and drinking very little, essentially only taking Ensure for the past couple weeks and has been bedbound over this interval.  Patient's grandson reports that she was in her bed when he went to the store, but found on the floor next to her bed when he returned several minutes later.  She missed her last dialysis session due to altered mental status and lethargy.   ED Course: Upon arrival to the ED, patient is found to have a temp of 36.1 C, saturating low 90s on room air, slightly tachycardic, and with blood pressure 103/73.  EKG features a sinus rhythm, chest x-ray is notable for stable cardiomegaly but no acute findings, and noncontrast head CT reveals a new hypodensity in the right parietal lobe suspicious for recent nonhemorrhagic infarct.  Chemistry panel features a potassium of 6.8.  Ammonia is elevated to 59, lactic acid elevated to 3.42, and hemoglobin stable at 10.1 with MCV of 107.8.  Blood cultures were collected and the patient was treated with vancomycin, Zosyn, bicarbonate, insulin with dextrose, and IV calcium in the ED.  Nephrology was consulted by the ED physician and will arrange urgent dialysis.  Patient will be admitted for ongoing evaluation  and management of hyperkalemia and acute encephalopathy with likely recent stroke and hyperammonemia, as well as possible infection.    Subjective: A/O x0, somnolent difficult to arouse.  Once aroused all she yells is ouch.  Per nephrology staff this is not her baseline usually very talkative pleasantly demented.   Assessment & Plan:   Principal Problem:   Hyperkalemia Active Problems:   ESRD on hemodialysis (HCC)   Anemia in chronic kidney disease (CKD)   Acute encephalopathy   Stroke (cerebrum) (HCC)   Hypothyroidism   Hip pain   Hypoglycemia without diagnosis of diabetes mellitus  Acute Encephalopathy - Found down at home - Reportedly AMS for several days to weeks - Found to have likely ischemic infarct on CT, ammonia elevated, hypoglycemic  RIGHT parietal lobe CVA - 6/18 CT head: Suspicious for stroke see results below -Lipid panel pending - Controlled BP.  Allow permissive HTN given new CVA -SBP goal 140-180, will tolerate as high as 200 - Initiate stroke work-up: MR MRA neck pending, MRI brain pending, ultrasound carotid Doppler pending Echocardiogram pending -PT/OT consult pending  Acute encephalopathy -Multifactorial, CVA, missing HD session, acute on chronic dementia.  Hypothyroidism, elevated ammonia? -Work-up underlying causes and correct.  Hyperkalemia; ESRD  - Presents with AMS after being found down at home - Potassium noted to be 6.8 on arrival to ED  - She missed her last HD session d/t AMS and was last dialyzed on 6/14  - Nephrology is arranging urgent HD and temporizing measures given in ED  - Continue cardiac monitoring and serial potassium  levels until normalized     Hypothyroidism  -TSH significantly elevated at 24 -Obtain free T4 -Increase Synthroid 75 mcg daily, will need to recheck TSH in 6 to 8 weeks - We will need to closely while monitor electrolyte levels especially phosphorus   Hip pain/gait difficulty - Patient reportedly bedbound  since fall 2 weeks ago complaining of hip pain -DG hips bilateral negative fracture.  Most likely soft tissue injury   Hypoglycemia  -Resolved  SIRS -On admission DOES NOT meet criteria for SIRS - DC empiric antibiotics      DVT prophylaxis: SCD Code Status: DNR Family Communication: None Disposition Plan: TBD   Consultants:  Nephrology     Procedures/Significant Events:  6/18 CT head W. Wo contrast:Brain:-New confluent area of hypodensity involving the right high parietal lobe suspicious for recent infarct.  -Stable mild superficial and moderate central atrophy is noted.   - Small left mastoid effusion with partial opacification of the left mastoid.      I have personally reviewed and interpreted all radiology studies and my findings are as above.  VENTILATOR SETTINGS:    Cultures 6/19 blood pending 6/19 RPR nonreactive     Antimicrobials: Anti-infectives (From admission, onward)   Start     Stop   10/03/17 1502  Vancomycin (VANCOCIN) 750-5 MG/150ML-% IVPB    Note to Pharmacy:  Kandis Ban  : cabinet override   10/03/17 1516   10/03/17 1200  vancomycin (VANCOCIN) IVPB 750 mg/150 ml premix     10/03/17 1612   10/03/17 0830  piperacillin-tazobactam (ZOSYN) IVPB 3.375 g  Status:  Discontinued     10/03/17 1752   10/03/17 0430  piperacillin-tazobactam (ZOSYN) IVPB 2.25 g  Status:  Discontinued     10/02/17 2022   10/02/17 2030  piperacillin-tazobactam (ZOSYN) IVPB 3.375 g     10/02/17 2141   10/02/17 2030  vancomycin (VANCOCIN) 2,000 mg in sodium chloride 0.9 % 500 mL IVPB     10/02/17 2323       Devices    LINES / TUBES:      Continuous Infusions: . sodium chloride    . piperacillin-tazobactam (ZOSYN)  IV    . vancomycin       Objective: Vitals:   10/03/17 0700 10/03/17 0715 10/03/17 0730 10/03/17 0826  BP: 133/84 132/75 (!) 131/93 (!) 127/56  Pulse: 82 79 78 95  Resp:    18  Temp:      TempSrc:      SpO2: 99% 100% 99% 91%    Weight:      Height:       No intake or output data in the 24 hours ending 10/03/17 0917 Filed Weights   10/02/17 1843  Weight: 170 lb (77.1 kg)    Examination:  General: A/O x0, does not follow commands No acute respiratory distress Neck:  Negative scars, masses, torticollis, lymphadenopathy, JVD Lungs: Clear to auscultation bilaterally without wheezes or crackles Cardiovascular: Regular rate and rhythm without murmur gallop or rub normal S1 and S2 Abdomen: negative abdominal pain, nondistended, positive soft, bowel sounds, no rebound, no ascites, no appreciable mass Extremities: No significant cyanosis, clubbing, or edema bilateral lower extremities Skin: Negative rashes, lesions, ulcers Psychiatric: Unable to assess secondary to altered mental status  Central nervous system: Mild withdrawal to painful stimuli.  Unable to assess secondary to altered mental status   .     Data Reviewed: Care during the described time interval was provided by me .  I have  reviewed this patient's available data, including medical history, events of note, physical examination, and all test results as part of my evaluation.   CBC: Recent Labs  Lab 10/02/17 1953 10/02/17 2032  WBC  --  7.2  NEUTROABS  --  5.4  HGB 11.9* 10.1*  HCT 35.0* 34.6*  MCV  --  107.8*  PLT  --  123*   Basic Metabolic Panel: Recent Labs  Lab 10/02/17 1953 10/02/17 2032  NA 136 139  K 6.8* 5.3*  CL 103 99*  CO2  --  24  GLUCOSE 84 84  BUN 75* 52*  CREATININE 8.00* 7.67*  CALCIUM  --  9.1   GFR: Estimated Creatinine Clearance: 6.6 mL/min (A) (by C-G formula based on SCr of 7.67 mg/dL (H)). Liver Function Tests: Recent Labs  Lab 10/02/17 2032  AST 53*  ALT 27  ALKPHOS 118  BILITOT 1.8*  PROT 8.0  ALBUMIN 2.0*   No results for input(s): LIPASE, AMYLASE in the last 168 hours. Recent Labs  Lab 10/02/17 2032  AMMONIA 59*   Coagulation Profile: No results for input(s): INR, PROTIME in the last 168  hours. Cardiac Enzymes: No results for input(s): CKTOTAL, CKMB, CKMBINDEX, TROPONINI in the last 168 hours. BNP (last 3 results) No results for input(s): PROBNP in the last 8760 hours. HbA1C: No results for input(s): HGBA1C in the last 72 hours. CBG: Recent Labs  Lab 10/02/17 2132 10/02/17 2222 10/03/17 0206 10/03/17 0559 10/03/17 0749  GLUCAP 56* 50* 137* 137* 114*   Lipid Profile: No results for input(s): CHOL, HDL, LDLCALC, TRIG, CHOLHDL, LDLDIRECT in the last 72 hours. Thyroid Function Tests: No results for input(s): TSH, T4TOTAL, FREET4, T3FREE, THYROIDAB in the last 72 hours. Anemia Panel: No results for input(s): VITAMINB12, FOLATE, FERRITIN, TIBC, IRON, RETICCTPCT in the last 72 hours. Urine analysis:    Component Value Date/Time   COLORURINE AMBER (A) 08/03/2014 1249   APPEARANCEUR CLOUDY (A) 08/03/2014 1249   LABSPEC 1.020 08/03/2014 1249   PHURINE 7.0 08/03/2014 1249   GLUCOSEU 500 (A) 08/03/2014 1249   HGBUR LARGE (A) 08/03/2014 1249   BILIRUBINUR MODERATE (A) 08/03/2014 1249   KETONESUR 15 (A) 08/03/2014 1249   PROTEINUR >300 (A) 08/03/2014 1249   UROBILINOGEN 4.0 (H) 08/03/2014 1249   NITRITE POSITIVE (A) 08/03/2014 1249   LEUKOCYTESUR TRACE (A) 08/03/2014 1249   Sepsis Labs: @LABRCNTIP (procalcitonin:4,lacticidven:4)  )No results found for this or any previous visit (from the past 240 hour(s)).       Radiology Studies: Dg Chest 2 View  Result Date: 10/02/2017 CLINICAL DATA:  Patient found on floor. Decreased urinary output. Patient missed dialysis on Monday. EXAM: CHEST - 2 VIEW COMPARISON:  06/26/2017 FINDINGS: Stable cardiomegaly with aortic atherosclerosis. No aneurysm. Right IJ dialysis catheter tip terminates in the right atrium. No overt pulmonary edema, pulmonary consolidation or effusion. Minimal atelectasis at the left lung base. Partially included bilateral shoulder arthroplasties are redemonstrated without significant change. IMPRESSION:  Stable mild cardiomegaly and aortic atherosclerosis. No active pulmonary disease. Electronically Signed   By: Tollie Eth M.D.   On: 10/02/2017 20:24   Ct Head Wo Contrast  Result Date: 10/02/2017 CLINICAL DATA:  Altered level of consciousness, patient has not been eating but only drinking Ensure. Decreased urinary output. EXAM: CT HEAD WITHOUT CONTRAST TECHNIQUE: Contiguous axial images were obtained from the base of the skull through the vertex without intravenous contrast. COMPARISON:  08/18/2016 FINDINGS: Brain: New confluent area of hypodensity involving the right high parietal lobe suspicious  for recent infarct. No hemorrhagic component is identified. Stable mild superficial and moderate central atrophy is noted. No effacement of the basal cisterns or fourth ventricle. No intra-axial mass nor extra-axial fluid collections. Chronic small vessel ischemic disease of periventricular subcortical white matter. Vascular: Moderate atherosclerosis of the carotid siphons. Skull: No skull fracture or suspicious osseous lesions. Sinuses/Orbits: No acute paranasal sinus disease. Intact orbits. Bilateral lens replacements. Other: Small left mastoid effusion with partial opacification of the left mastoid. Osteoarthritis of the included TMJ though assessment is limited due to motion artifacts. IMPRESSION: 1. New area of hypodensity in the right parietal lobe suspicious for a recent nonhemorrhagic infarct. 2. Chronic stable atrophy with small vessel ischemia. 3. Small left mastoid effusion. Electronically Signed   By: Tollie Eth M.D.   On: 10/02/2017 20:53   Dg Hips Bilat With Pelvis 2v  Result Date: 10/02/2017 CLINICAL DATA:  72 year old female status post fall last week, and today. Found down. EXAM: DG HIP (WITH OR WITHOUT PELVIS) 2V BILAT COMPARISON:  CT Abdomen and Pelvis 07/22/2017. FINDINGS: Portable AP and frog-leg lateral views of the pelvis. The femoral heads are normally located and hip joint spaces are  normal for age. Both proximal femurs appear intact. No pelvis fracture identified. Stool ball in the rectum. Iliofemoral calcified atherosclerosis. IMPRESSION: No hip fracture or acute osseous abnormality identified. Electronically Signed   By: Odessa Fleming M.D.   On: 10/02/2017 22:59        Scheduled Meds: .  stroke: mapping our early stages of recovery book   Does not apply Once  . Chlorhexidine Gluconate Cloth  6 each Topical Q0600  . levothyroxine  50 mcg Oral QAC breakfast  . sodium chloride flush  3 mL Intravenous Q12H  . sodium chloride flush  3 mL Intravenous Q12H   Continuous Infusions: . sodium chloride    . piperacillin-tazobactam (ZOSYN)  IV    . vancomycin       LOS: 1 day    Time spent: 40 minutes    Tanae Petrosky, Roselind Messier, MD [facility] Pager [phone]   If 7PM-7AM, please contact night-coverage [email or web address] Password [facility] 10/03/2017, 9:17 AM

## 2017-10-03 NOTE — Progress Notes (Signed)
*  PRELIMINARY RESULTS* Vascular Ultrasound Carotid Duplex (Doppler) has been completed.   Findings suggest 1-39% internal carotid artery stenosis bilaterally. Vertebral arteries are patent with antegrade flow.  10/03/2017 11:19 AM Gertie FeyMichelle Uno Esau, BS, RVT, RDCS, RDMS

## 2017-10-03 NOTE — Progress Notes (Signed)
Pt left the room in a bed for MRI, escorted by MRI staff.

## 2017-10-03 NOTE — Progress Notes (Signed)
PT Cancellation Note  Patient Details Name: Samantha [name] MRN: [id number] DOB: 1945/09/19   Cancelled Treatment:    Reason Eval/Treat Not Completed: Patient at procedure or test/unavailable Pt currently at vascular lab. Will follow up as schedule allows.   Gladys DammeBrittany Thoma Paulsen, PT, DPT  Acute Rehabilitation Services  Pager: [phone]  Lehman PromBrittany S Dalayah Deahl 10/03/2017, 10:39 AM

## 2017-10-04 LAB — HIV ANTIBODY (ROUTINE TESTING W REFLEX): HIV Screen 4th Generation wRfx: NONREACTIVE

## 2017-10-04 LAB — PHOSPHORUS: PHOSPHORUS: 2.4 mg/dL — AB (ref 2.5–4.6)

## 2017-10-04 LAB — CBC
HCT: 25.4 % — ABNORMAL LOW (ref 36.0–46.0)
Hemoglobin: 7.7 g/dL — ABNORMAL LOW (ref 12.0–15.0)
MCH: 31.4 pg (ref 26.0–34.0)
MCHC: 30.3 g/dL (ref 30.0–36.0)
MCV: 103.7 fL — AB (ref 78.0–100.0)
PLATELETS: 107 10*3/uL — AB (ref 150–400)
RBC: 2.45 MIL/uL — ABNORMAL LOW (ref 3.87–5.11)
RDW: 20.2 % — ABNORMAL HIGH (ref 11.5–15.5)
WBC: 5.3 10*3/uL (ref 4.0–10.5)

## 2017-10-04 LAB — GLUCOSE, CAPILLARY
GLUCOSE-CAPILLARY: 113 mg/dL — AB (ref 65–99)
GLUCOSE-CAPILLARY: 65 mg/dL (ref 65–99)
GLUCOSE-CAPILLARY: 66 mg/dL (ref 65–99)
GLUCOSE-CAPILLARY: 87 mg/dL (ref 65–99)
Glucose-Capillary: 79 mg/dL (ref 65–99)
Glucose-Capillary: 68 mg/dL (ref 65–99)
Glucose-Capillary: 58 mg/dL — ABNORMAL LOW (ref 65–99)
Glucose-Capillary: 88 mg/dL (ref 65–99)
Glucose-Capillary: 54 mg/dL — ABNORMAL LOW (ref 65–99)
Glucose-Capillary: 125 mg/dL — ABNORMAL HIGH (ref 65–99)

## 2017-10-04 LAB — BASIC METABOLIC PANEL
Anion gap: 9 (ref 5–15)
BUN: 20 mg/dL (ref 6–20)
CO2: 30 mmol/L (ref 22–32)
Calcium: 8.2 mg/dL — ABNORMAL LOW (ref 8.9–10.3)
Chloride: 95 mmol/L — ABNORMAL LOW (ref 101–111)
Creatinine, Ser: 4.34 mg/dL — ABNORMAL HIGH (ref 0.44–1.00)
GFR, EST AFRICAN AMERICAN: 11 mL/min — AB (ref >60)
GFR, EST NON AFRICAN AMERICAN: 9 mL/min — AB (ref >60)
GLUCOSE: 98 mg/dL (ref 65–99)
POTASSIUM: 3.8 mmol/L (ref 3.5–5.1)
Sodium: 134 mmol/L — ABNORMAL LOW (ref 135–145)

## 2017-10-04 LAB — FOLATE RBC
Folate, Hemolysate: >620 ng/mL
Folate, RBC: >2153 ng/mL (ref >498)
HEMATOCRIT: 28.8 % — AB (ref 34.0–46.6)

## 2017-10-04 LAB — MAGNESIUM: MAGNESIUM: 2 mg/dL (ref 1.7–2.4)

## 2017-10-04 LAB — ECHOCARDIOGRAM COMPLETE
Height: 64 in
Weight: 2720 oz

## 2017-10-04 MED ORDER — MIDODRINE HCL 5 MG PO TABS
5.0000 mg | ORAL_TABLET | Freq: Three times a day (TID) | ORAL | Status: DC
Start: 2017-10-04 — End: 2017-10-09
  Administered 2017-10-04 – 2017-10-09 (×11): 5 mg via ORAL
  Filled 2017-10-04 (×13): qty 1

## 2017-10-04 MED ORDER — LORAZEPAM 2 MG/ML IJ SOLN
0.5000 mg | Freq: Once | INTRAMUSCULAR | Status: AC
  Administered 2017-10-04: 0.5 mg via INTRAVENOUS
  Filled 2017-10-04: qty 1

## 2017-10-04 MED ORDER — DEXTROSE 50 % IV SOLN
INTRAVENOUS | Status: AC
  Administered 2017-10-04 (×2): 25 mL
  Filled 2017-10-04: qty 50

## 2017-10-04 MED ORDER — DEXTROSE 10 % IV SOLN
INTRAVENOUS | Status: DC
  Administered 2017-10-04: 08:00:00 via INTRAVENOUS

## 2017-10-04 MED ORDER — DEXTROSE 10 % IV SOLN: INTRAVENOUS | Status: DC

## 2017-10-04 MED ORDER — VITAMIN B-1 100 MG PO TABS
100.0000 mg | ORAL_TABLET | Freq: Every day | ORAL | Status: DC
Start: 2017-10-04 — End: 2017-10-09
  Administered 2017-10-04 – 2017-10-09 (×6): 100 mg via ORAL
  Filled 2017-10-04 (×6): qty 1

## 2017-10-04 MED ORDER — LACTULOSE 10 GM/15ML PO SOLN
30.0000 g | Freq: Two times a day (BID) | ORAL | Status: AC
  Administered 2017-10-04 – 2017-10-05 (×2): 30 g via ORAL
  Filled 2017-10-04 (×2): qty 60

## 2017-10-04 MED ORDER — DEXTROSE 50 % IV SOLN
INTRAVENOUS | Status: AC
  Administered 2017-10-04: 50 mL
  Filled 2017-10-04: qty 50

## 2017-10-04 NOTE — Progress Notes (Addendum)
[facility] Progress Note   Subjective:  Seen in room. Will open eyes and moan/grunt, still not using sentences. RUE edematous.  Objective Vitals:   10/04/17 0815 10/04/17 0830 10/04/17 0845 10/04/17 1143  BP:      Pulse:    99  Resp:      Temp:    98.1 F (36.7 C)  TempSrc:    Oral  SpO2: 100% 100% 100%   Weight:      Height:       Physical Exam General: Ill appearing female, NAD Heart: RRR, 2/6 SEM Lungs: Roughly clear anteriorly, will not take deep breaths for exam. Abdomen: soft, moans with palpation Extremities: 2+ LE edema, + flank edema and in RUE [facility]  Additional Objective Labs: Basic Metabolic Panel: Recent Labs  Lab 10/02/17 1953 10/02/17 2032 10/03/17 0617 10/03/17 1218 10/03/17 2020  NA 136 139 138  --   --   K 6.8* 5.3* 4.8 4.8 4.7  CL 103 99* 98*  --   --   CO2  --  24 26  --   --   GLUCOSE 84 84 122*  --   --   BUN 75* 52* 55*  --   --   CREATININE 8.00* 7.67* 7.96*  --   --   CALCIUM  --  9.1 9.4  --   --    Liver Function Tests: Recent Labs  Lab 10/02/17 2032 10/03/17 0617  AST 53* 42*  ALT 27 24  ALKPHOS 118 110  BILITOT 1.8* 2.1*  PROT 8.0 7.5  ALBUMIN 2.0* 1.9*   CBC: Recent Labs  Lab 10/02/17 1953 10/02/17 2032 10/03/17 1218  WBC  --  7.2 6.0  NEUTROABS  --  5.4 4.3  HGB 11.9* 10.1* 8.6*  HCT 35.0* 34.6* 28.3*  MCV  --  107.8* 104.4*  PLT  --  123* 127*   Studies/Results: Dg Chest 2 View  Result Date: 10/02/2017 CLINICAL DATA:  Patient found on floor. Decreased urinary output. Patient missed dialysis on Monday. EXAM: CHEST - 2 VIEW COMPARISON:  06/26/2017 FINDINGS: Stable cardiomegaly with aortic atherosclerosis. No aneurysm. Right IJ dialysis catheter tip terminates in the right atrium. No overt pulmonary edema, pulmonary consolidation or effusion. Minimal atelectasis at the left lung base. Partially included bilateral shoulder arthroplasties are redemonstrated without  significant change. IMPRESSION: Stable mild cardiomegaly and aortic atherosclerosis. No active pulmonary disease. Electronically Signed   By: Tollie Ethavid  Kwon M.D.   On: 10/02/2017 20:24   Ct Head Wo Contrast  Result Date: 10/02/2017 CLINICAL DATA:  Altered level of consciousness, patient has not been eating but only drinking Ensure. Decreased urinary output. EXAM: CT HEAD WITHOUT CONTRAST TECHNIQUE: Contiguous axial images were obtained from the base of the skull through the vertex without intravenous contrast. COMPARISON:  08/18/2016 FINDINGS: Brain: New confluent area of hypodensity involving the right high parietal lobe suspicious for recent infarct. No hemorrhagic component is identified. Stable mild superficial and moderate central atrophy is noted. No effacement of the basal cisterns or fourth ventricle. No intra-axial mass nor extra-axial fluid collections. Chronic small vessel ischemic disease of periventricular subcortical white matter. Vascular: Moderate atherosclerosis of the carotid siphons. Skull: No skull fracture or suspicious osseous lesions. Sinuses/Orbits: No acute paranasal sinus disease. Intact orbits. Bilateral lens replacements. Other: Small left mastoid effusion with partial opacification of the left mastoid. Osteoarthritis of the included TMJ though assessment is limited due to motion artifacts. IMPRESSION: 1. New area of  hypodensity in the right parietal lobe suspicious for a recent nonhemorrhagic infarct. 2. Chronic stable atrophy with small vessel ischemia. 3. Small left mastoid effusion. Electronically Signed   By: Tollie Eth M.D.   On: 10/02/2017 20:53   Mr Maxine Glenn Neck Wo Contrast  Result Date: 10/03/2017 CLINICAL DATA:  Initial evaluation for acute altered mental status. EXAM: MRI HEAD WITHOUT CONTRAST MRA NECK WITHOUT CONTRAST TECHNIQUE: Multiplanar, multiecho pulse sequences of the brain and surrounding structures were obtained without intravenous contrast. COMPARISON:  Prior CT  from 10/02/2017 FINDINGS: MRI BRAIN FINDINGS Brain: Study severely degraded by motion artifact, limiting evaluation. Generalized age-related cerebral atrophy. Chronic microvascular ischemic changes grossly stable. Encephalomalacia within the right parietal lobe consistent with remote right posterior MCA territory infarct. This corresponds with abnormality seen on prior CT. No definite abnormal foci of restricted diffusion to suggest acute or subacute ischemia. Gray-white matter differentiation otherwise grossly maintained. Additional small remote right cerebellar infarct noted. No obvious intracranial hemorrhage. No appreciable mass lesion. No mass effect or midline shift. Diffuse ventricular prominence similar to previous examinations. Appreciable extra-axial fluid collection. Vascular: Major intracranial vascular flow voids grossly maintained at the skull base. Skull and upper cervical spine: Craniocervical junction poorly evaluated on this motion degraded exam. Bone marrow signal intensity grossly within normal limits. No obvious focal osseous lesion. No scalp soft tissue abnormality. Sinuses/Orbits: Globes and orbital soft tissues demonstrate no definite acute abnormality. Patient status post lens extraction bilaterally. Paranasal sinuses are grossly clear. Left mastoid effusion noted. Other: None. MRA NECK FINDINGS Study severely limited by extensive motion artifact and lack of IV contrast. Aortic arch and origin of the great vessels not assessed on this exam. Common and internal carotid arteries grossly patent within the neck. Although note definite high-grade stenosis identified, evaluation fairly limited visualized vertebral arteries grossly patent with antegrade flow. Evaluation for possible vertebral artery stenosis fairly limited. IMPRESSION: MRI HEAD IMPRESSION 1. Severely limited exam due to extensive motion artifact. 2. No definite acute intracranial abnormality. Previously noted right parietal  abnormality is consistent with a chronic right posterior MCA infarct. 3. Grossly stable atrophy with chronic small vessel ischemic. MRA NECK IMPRESSION 1. Severely limited and nearly nondiagnostic examination to extensive motion artifact and lack of IV contrast. 2. Major arterial vasculature of the neck grossly patent without occlusion. Evaluation for discrete stenosis limited on this exam. Electronically Signed   By: Rise Mu M.D.   On: 10/03/2017 22:09   Mr Brain Wo Contrast  Result Date: 10/03/2017 CLINICAL DATA:  Initial evaluation for acute altered mental status. EXAM: MRI HEAD WITHOUT CONTRAST MRA NECK WITHOUT CONTRAST TECHNIQUE: Multiplanar, multiecho pulse sequences of the brain and surrounding structures were obtained without intravenous contrast. COMPARISON:  Prior CT from 10/02/2017 FINDINGS: MRI BRAIN FINDINGS Brain: Study severely degraded by motion artifact, limiting evaluation. Generalized age-related cerebral atrophy. Chronic microvascular ischemic changes grossly stable. Encephalomalacia within the right parietal lobe consistent with remote right posterior MCA territory infarct. This corresponds with abnormality seen on prior CT. No definite abnormal foci of restricted diffusion to suggest acute or subacute ischemia. Gray-white matter differentiation otherwise grossly maintained. Additional small remote right cerebellar infarct noted. No obvious intracranial hemorrhage. No appreciable mass lesion. No mass effect or midline shift. Diffuse ventricular prominence similar to previous examinations. Appreciable extra-axial fluid collection. Vascular: Major intracranial vascular flow voids grossly maintained at the skull base. Skull and upper cervical spine: Craniocervical junction poorly evaluated on this motion degraded exam. Bone marrow signal intensity grossly within normal limits. No  obvious focal osseous lesion. No scalp soft tissue abnormality. Sinuses/Orbits: Globes and orbital soft  tissues demonstrate no definite acute abnormality. Patient status post lens extraction bilaterally. Paranasal sinuses are grossly clear. Left mastoid effusion noted. Other: None. MRA NECK FINDINGS Study severely limited by extensive motion artifact and lack of IV contrast. Aortic arch and origin of the great vessels not assessed on this exam. Common and internal carotid arteries grossly patent within the neck. Although note definite high-grade stenosis identified, evaluation fairly limited visualized vertebral arteries grossly patent with antegrade flow. Evaluation for possible vertebral artery stenosis fairly limited. IMPRESSION: MRI HEAD IMPRESSION 1. Severely limited exam due to extensive motion artifact. 2. No definite acute intracranial abnormality. Previously noted right parietal abnormality is consistent with a chronic right posterior MCA infarct. 3. Grossly stable atrophy with chronic small vessel ischemic. MRA NECK IMPRESSION 1. Severely limited and nearly nondiagnostic examination to extensive motion artifact and lack of IV contrast. 2. Major arterial vasculature of the neck grossly patent without occlusion. Evaluation for discrete stenosis limited on this exam. Electronically Signed   By: Rise Mu M.D.   On: 10/03/2017 22:09   Dg Hips Bilat With Pelvis 2v  Result Date: 10/02/2017 CLINICAL DATA:  72 year old female status post fall last week, and today. Found down. EXAM: DG HIP (WITH OR WITHOUT PELVIS) 2V BILAT COMPARISON:  CT Abdomen and Pelvis 07/22/2017. FINDINGS: Portable AP and frog-leg lateral views of the pelvis. The femoral heads are normally located and hip joint spaces are normal for age. Both proximal femurs appear intact. No pelvis fracture identified. Stool ball in the rectum. Iliofemoral calcified atherosclerosis. IMPRESSION: No hip fracture or acute osseous abnormality identified. Electronically Signed   By: Odessa Fleming M.D.   On: 10/02/2017 22:59   Medications: . sodium  chloride    . dextrose 10 mL/hr at 10/04/17 0806   .  stroke: mapping our early stages of recovery book   Does not apply Once  . Chlorhexidine Gluconate Cloth  6 each Topical Q0600  . levothyroxine  50 mcg Oral QAC breakfast  . sodium chloride flush  3 mL Intravenous Q12H  . sodium chloride flush  3 mL Intravenous Q12H    Dialysis Orders: MWF at [facility] 4hr, 400/800, 3K/2.25Ca, EDW 59.5kg, UF #2/linear Na, TDC, no heparin. - Mircera IV q 2 weeks (last given 09/24/17) - No VDRA/venofer.  Assessment/Plan: 1.  ? R parietal CVA: Reported as acute on CT, on MRI looks more chronic in nature. Plan per primary team. 2.  AMS: Ongoing for past 2 weeks per note, worsening. Multiple causes possible: CVA, infection, hepatic encephalopathy, etc.possibly due to underlying dementia exacerbated by CVA and/or pneumonia 3. ?Sepsis: ^ Lactate and tachycardic. BCx drawn and started empirically on Vanc/Zosyn. 4.  ESRD: Missed HD on 6/17, s/p HD 6/19. Essentially no UF d/t hypotension. 5.  Hypertension/volume: BP low, + visible edema on exam. Has been losing actually body weight rapidly over the past few weeks - EDW has been dropped ~ 6kg within the past weeks and + edema today indicating that it needs to be dropped further. UF as tolerated. 6.  Anemia of CKD, + FOBT: Hgb 10.1 -> 8.6, next due for ESA dosing on 6/24. + FOBT. Follow closely and transfuse prn. 7.  Metabolic bone disease: Ca ok, no binders for now. Follow. 8.  Nutrition:  Alb 1.9, very low in setting of not eating and acute illness. 9.  Hx alcoholic cirrhosis: Ammonia ^, continue lactulose. 10.  Hypothyroidism:  TSH high, per primary. 11.  Type 2 DM: Hypoglycemic at times, per primary. 12.  Dispo: Chronic medical decline and now with acute illness/fall/CVA compounding it. Will plan to dialyze tomorrow again and treat possible infections, but if she doesn't perk up quickly then we soon will need to consider palliative options. Her grandson  called outpatient HD unit to inform them that he cannot really care for her at home anymore, she is requiring MORE than total care.  ** Lab unable to draw blood from patient x multiple tries, ok for IV team to access [facility] **    Ozzie Hoyle, PA-C 10/04/2017, 12:58 PM  [facility] Pager: [phone]

## 2017-10-04 NOTE — Evaluation (Addendum)
Physical Therapy Evaluation Patient Details Name: Samantha [name] MRN: [id number] DOB: [birth year] Today's Date: 10/04/2017   History of Present Illness  72 y.o.BF PMHx Dementia, ESRD on HD M/W/F  Hypothyroidism, pancreatitis, hepatitis B, diabetes type II uncontrolled with complication, GI bleed, nephrolithiasis, L humerus fx with ORIF; REverse total shoulder,  HTN,recent fall, and decline in her physical and cognitive status over the past 2 weeks. Pt with acute encephalopathy and R parietal CVA.   Clinical Impression  No family/caregiver present to determine previous level of function. Per chart review, patient lives with her grandson and has been bed bound for at least 2 weeks following a fall. On PT evaluation, patient does not follow simple commands, is agitated with movement, and very limited by generalized pain. Requiring two person total assist with all bed mobility and has poor sitting balance at edge of bed. BP 100/57, HR in the 90's. Highly recommending SNF to maximize functional mobility. Will follow acutely.    Follow Up Recommendations SNF;Supervision/Assistance - 24 hour    Equipment Recommendations  Other (comment)(defer to SNF)    Recommendations for Other Services       Precautions / Restrictions Precautions Precautions: Fall Precaution Comments: at risk for skin breakdown Restrictions Weight Bearing Restrictions: No      Mobility  Bed Mobility Overal bed mobility: Needs Assistance Bed Mobility: Supine to Sit;Sit to Supine;Rolling Rolling: Total assist;+2 for physical assistance   Supine to sit: Total assist;+2 for physical assistance Sit to supine: Total assist;+2 for physical assistance   General bed mobility comments: Total assist + 2 for all bed mobility, groaning throughout movement. Rolling to bilateral sides for peri care and bed pad change.   Transfers                 General transfer comment: not attempted  Ambulation/Gait                 Stairs            Wheelchair Mobility    Modified Rankin (Stroke Patients Only) Modified Rankin (Stroke Patients Only) Pre-Morbid Rankin Score: Severe disability Modified Rankin: Severe disability     Balance Overall balance assessment: Needs assistance   Sitting balance-Leahy Scale: Zero Sitting balance - Comments: posterior lean                                      Pertinent Vitals/Pain Pain Assessment: Faces Faces Pain Scale: Hurts whole lot Pain Location: groaning, agitated with any movement Pain Descriptors / Indicators: Grimacing;Guarding;Moaning Pain Intervention(s): Limited activity within patient's tolerance;Monitored during session;Repositioned    Home Living Family/patient expects to be discharged to:: Unsure Living Arrangements: Other relatives(grandson) Available Help at Discharge: Family Type of Home: Apartment Home Access: Level entry     Home Layout: One level Home Equipment: Cane - single point Additional Comments: Per previous admission note - Lives with grandson, who works during the day; family plans to have assistance when family at work    Prior Function Level of Independence: Independent with assistive device(s)         Comments: pt uses a cane at all times; sponge baths and dresses herself; family assists with shower on Sundays(unsure of PLOF; bedbound for last 2 weeks PTA)     Hand Dominance   Dominant Hand: Right    Extremity/Trunk Assessment   Upper Extremity Assessment Upper Extremity Assessment: Defer to OT evaluation  Lower Extremity Assessment Lower Extremity Assessment: RLE deficits/detail;LLE deficits/detail RLE Deficits / Details: Ankle dorsiflexion limited -5 degrees from neutral. Contusion noted on proximal lateral thigh. LLE Deficits / Details: Ankle dorsiflexion able to passively reach neutral    Cervical / Trunk Assessment Cervical / Trunk Assessment: Other exceptions(L lateral  lean/posterior bias)  Communication   Communication: HOH(will further assess; difficult to understand)  Cognition Arousal/Alertness: Lethargic Behavior During Therapy: Flat affect;Agitated Overall Cognitive Status: No family/caregiver present to determine baseline cognitive functioning                                 General Comments: Does not follow simple commands or answer questions appropriately. Becomes increasingly agitated with movement secondary to pain.       General Comments General comments (skin integrity, edema, etc.): multiple areas of full thickness skin loss around sacrum; related to moisture not pressure injuries    Exercises     Assessment/Plan    PT Assessment Patient needs continued PT services  PT Problem List Decreased strength;Decreased range of motion;Decreased activity tolerance;Decreased balance;Decreased mobility;Decreased coordination;Decreased cognition;Decreased safety awareness;Impaired sensation;Pain       PT Treatment Interventions DME instruction;Functional mobility training;Therapeutic activities;Therapeutic exercise;Balance training;Neuromuscular re-education;Patient/family education    PT Goals (Current goals can be found in the Care Plan section)  Acute Rehab PT Goals Patient Stated Goal: none stated PT Goal Formulation: Patient unable to participate in goal setting    Frequency Min 2X/week   Barriers to discharge Decreased caregiver support      Co-evaluation               AM-PAC PT "6 Clicks" Daily Activity  Outcome Measure Difficulty turning over in bed (including adjusting bedclothes, sheets and blankets)?: Unable Difficulty moving from lying on back to sitting on the side of the bed? : Unable Difficulty sitting down on and standing up from a chair with arms (e.g., wheelchair, bedside commode, etc,.)?: Unable Help needed moving to and from a bed to chair (including a wheelchair)?: Total Help needed walking in  hospital room?: Total Help needed climbing 3-5 steps with a railing? : Total 6 Click Score: 6    End of Session Equipment Utilized During Treatment: Oxygen Activity Tolerance: Patient limited by pain Patient left: in bed;with call bell/phone within reach;with bed alarm set Nurse Communication: Mobility status PT Visit Diagnosis: Muscle weakness (generalized) (M62.81);Adult, failure to thrive (R62.7);Other abnormalities of gait and mobility (R26.89)    Time: 1610-9604 PT Time Calculation (min) (ACUTE ONLY): 32 min   Charges:   PT Evaluation $PT Eval Moderate Complexity: 1 Mod     PT G Codes:        Laurina Bustle, PT, DPT Acute Rehabilitation Services  Pager: [phone]  Vanetta Mulders 10/04/2017, 10:16 AM

## 2017-10-04 NOTE — Progress Notes (Signed)
Lab staff unsuccessful at obtaining blood samples today.

## 2017-10-04 NOTE — Evaluation (Signed)
Speech Language Pathology Evaluation Patient Details Name: Samantha [name] MRN: [id number] DOB: 1946-01-28 Today's Date: 10/04/2017 Time: 8295-6213 SLP Time Calculation (min) (ACUTE ONLY): 15 min  Problem List:  Patient Active Problem List   Diagnosis Date Noted  . Hyperkalemia 10/02/2017  . Stroke (cerebrum) (HCC) 10/02/2017  . Hypothyroidism 10/02/2017  . Hip pain 10/02/2017  . Hypoglycemia without diagnosis of diabetes mellitus 10/02/2017  . SIRS (systemic inflammatory response syndrome) (HCC)   . S/P ORIF (open reduction internal fixation) fracture 02/20/2017  . 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 Secondary hyperparathyroidism (HCC) 08/19/2016  . Left supracondylar humerus fracture, closed, initial encounter 08/18/2016  . Adnexal mass   . ESRD on dialysis (HCC)   . Acute encephalopathy 06/14/2015  . 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 Thrombocytopenia (HCC) 12/25/2014  . 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 Cardiomegaly 12/25/2014  . Hyponatremia 04/12/2012  . SOB (shortness of breath) 04/08/2012  . Symptomatic cholelithiasis 11/16/2011  . Transaminitis 06/08/2011  . Hepatitis B antibody positive 06/08/2011  . ESRD on hemodialysis (HCC) 06/06/2011  . HTN (hypertension) 06/06/2011  . Shoulder pain 06/06/2011  . Anemia in chronic kidney disease (CKD) 06/06/2011   Past Medical History:  Past Medical History:  Diagnosis Date  . Anemia   . Arthritis    left shoulder  . Cellulitis of left upper extremity   . Constipation   . Dementia   . Diabetes mellitus without complication (HCC)    borderline  . Dizziness   . Dry skin   . Gastric ulcer   . GERD (gastroesophageal reflux disease)   . GI bleed 06/06/2011  . Gram-negative bacteremia   . H/O: GI bleed   . Headache(784.0)    occasionally  . Hepatitis    Hep B  . History of blood transfusion   . History of kidney  stones   . Hypertension   . Hypothyroidism    takes Synthroid daily  . Impaired hearing   . Joint pain   . Joint swelling   . Mechanical complication of other vascular device, implant, and graft 10/08/2013  . Occasional numbness/prickling/tingling of fingers and toes   . Oligouria   . Pancreatitis 04/10/2012  . Peripheral edema   . Renal disorder    m, w, F   . Secondary hyperparathyroidism (HCC) 08/19/2016  . Sepsis (HCC) 06/14/2015  . Shoulder pain   . Vaginal bleeding 08/15/2015   Past Surgical History:  Past Surgical History:  Procedure Laterality Date  . ARTERIOVENOUS GRAFT PLACEMENT Left    forearm  . cataract surgery     bilateral  . CHOLECYSTECTOMY  12/26/2011  . CHOLECYSTECTOMY  12/26/2011   Procedure: LAPAROSCOPIC CHOLECYSTECTOMY;  Surgeon: Atilano Ina, MD,FACS;  Location: [facility] OR;  Service: General;  Laterality: N/A;  . ESOPHAGOGASTRODUODENOSCOPY  06/09/2011   Procedure: ESOPHAGOGASTRODUODENOSCOPY (EGD);  Surgeon: Theda Belfast, MD;  Location[facility] END[facility]COPY;  Service: Endoscopy;  Laterality: N/A;  . I&D EXTREMITY Left 03/23/2015   Procedure: DRAINAGE OF LEFT ARM SEROMA;  Surgeon: Fransisco Hertz, MD;  Location: [facility] OR;  Service: Vascular;  Laterality: Left;  . INSERTION OF DIALYSIS CATHETER Right 01/12/2015   Procedure: INSERTION OF DIALYSIS CATHETER;  Surgeon: Fransisco Hertz, MD;  Location: [facility] OR;  Service: Vascular;  Laterality: Right;  . LIGATION ARTERIOVENOUS GORTEX GRAFT Left 03/23/2015   Procedure: LIGATION AND EXCISION OF LEFT ARTERIOVENOUS GORTEX GRAFT;  Surgeon: Ottie Glazier  Imogene Burnhen, MD;  [facility];  Service: Vascular;  Laterality: Left;  . ORIF HUMERUS FRACTURE Left 02/20/2017   Procedure: LEFT HUMERUS OPEN REDUCTION INTERNAL FIXATION;  Surgeon: Beverely LowNorris, Steve, MD;  [facility];  Service: Orthopedics;  Laterality: Left;  . REVERSE SHOULDER ARTHROPLASTY  09/22/2011   Procedure: REVERSE SHOULDER ARTHROPLASTY;  Surgeon: Verlee RossettiSteven R Norris, MD;  [facility];  Service:  Orthopedics;  Laterality: Left;  left reverse shoulder arthroplasty  . REVISION OF ARTERIOVENOUS GORETEX GRAFT Left 01/12/2015   Procedure: REVISION OF Left FOREARM ARTERIOVENOUS GORETEX GRAFT;  Surgeon: Fransisco HertzBrian L Chen, MD;  [facility];  Service: Vascular;  Laterality: Left;  . SHOULDER SURGERY  3-2818yrs ago   right replacement  . SHUNTOGRAM Left 08/29/2012   Procedure: SHUNTOGRAM;  Surgeon: Fransisco HertzBrian L Chen, MD;  [facility];  Service: Cardiovascular;  Laterality: Left;  arm   HPI:  72 y.o. BF PMHx Dementia, ESRD on HD M/W/F  Hypothyroidism, pancreatitis, hepatitis B, diabetes type II uncontrolled with complication, GI bleed, nephrolithiasis, HTN,recent fall, and decline in her physical and cognitive status over the past 2 weeks. Patient lives with her grandson who reports that she fell approximately 2 weeks ago, was evaluated in the emergency department with negative radiographs at that time, but has been bedbound since then, complaining of hip and pelvic pain.  Patient has been eating and drinking very little, essentially only taking Ensure for the past couple weeks and has been bedbound over this interval. CT reveals a new hypodensity in the right parietal lobe suspicious for recent nonhemorrhagic infarct. Pt seen in 2018 after hip fx, required pureed foods, thin liquids, was dysphonic at the time.    Assessment / Plan / Recommendation Clinical Impression  Pts cognitive function characterized by dementia, exacerbated by severe pain. Pt is unable to attend more than a moment to functional tasks with visual and contextual cues. She does not follow commands, but also seems to have severe hearing impairment. If asked a question, she verbalizes a nonsensical, unintelligible response. Given baseine dementia and severe impariment, acute cognitive intervention unlikely to yield any improvement in communication or cognition. Will sign off at this time, but f/u only for swallowing.     SLP Assessment   SLP Recommendation/Assessment: Patient does not need any further Speech Lanaguage Pathology Services SLP Visit Diagnosis: Cognitive communication deficit (R41.841)    Follow Up Recommendations  Skilled Nursing facility    Frequency and Duration min 1 x/week         SLP Evaluation Cognition  Overall Cognitive Status: No family/caregiver present to determine baseline cognitive functioning Orientation Level: Disoriented X4 Attention: Focused Focused Attention: Impaired Focused Attention Impairment: Verbal basic;Functional basic Awareness: Impaired Awareness Impairment: Intellectual impairment Behaviors: Restless       Comprehension  Auditory Comprehension Overall Auditory Comprehension: Impaired Yes/No Questions: Impaired Basic Biographical Questions: 0-25% accurate Commands: Impaired One Step Basic Commands: 0-24% accurate Conversation: Simple Interfering Components: Hearing;Attention;Pain;Anxiety    Expression Verbal Expression Overall Verbal Expression: Impaired Initiation: No impairment Level of Generative/Spontaneous Verbalization: Word Repetition: Impaired Level of Impairment: Word level Naming: Not tested Written Expression Dominant Hand: Right   Oral / Motor  Oral Motor/Sensory Function Overall Oral Motor/Sensory Function: Generalized oral weakness Motor Speech Overall Motor Speech: Impaired Respiration: Within functional limits Phonation: Hoarse Articulation: Impaired Level of Impairment: Word Intelligibility: Intelligibility reduced Word: 0-24% accurate Phrase: 0-24% accurate Interfering Components: Inadequate dentition;Hearing loss   GO  Harlon Ditty, [place] CCC-SLP [phone]   Claudine Mouton 10/04/2017, 10:51 AM

## 2017-10-04 NOTE — Progress Notes (Signed)
CRITICAL VALUE STICKER  CRITICAL VALUE:  Results for Pervis HockingROBERTS, Thomasene M (MRN [id number]) as of 10/04/2017 19:53  Ref. Range 10/04/2017 19:48  Glucose-Capillary Latest Ref Range: 65 - 99 mg/dL 13 (LL)   Checked at [id number] it is now 54.  RECEIVER (on-site recipient of call):  DATE & TIME NOTIFIED: 10/04/17 2000  MESSENGER (representative from lab):  MD NOTIFIED: Will txt page.  TIME OF NOTIFICATION:  RESPONSE: Will treat per protocol.

## 2017-10-04 NOTE — Progress Notes (Signed)
Patient BS was 66mg /dl at [id number], administered D50%x1 and rechecked, it was 76mg /dl, and 87mg /dl at [id number] am, Pt is NPO, no new s/s noted other than agitation, MD Lucretia RoersWood is awared, and will continue to monitor closely.

## 2017-10-04 NOTE — Evaluation (Signed)
Clinical/Bedside Swallow Evaluation Patient Details  Name: Samantha [name] MRN: [id number] Date of Birth: Sep 03, 1945  Today's Date: 10/04/2017 Time: SLP Start Time (ACUTE ONLY): 0940 SLP Stop Time (ACUTE ONLY): 0955 SLP Time Calculation (min) (ACUTE ONLY): 15 min  Past Medical History:  Past Medical History:  Diagnosis Date  . Anemia   . Arthritis    left shoulder  . Cellulitis of left upper extremity   . Constipation   . Dementia   . Diabetes mellitus without complication (HCC)    borderline  . Dizziness   . Dry skin   . Gastric ulcer   . GERD (gastroesophageal reflux disease)   . GI bleed 06/06/2011  . Gram-negative bacteremia   . H/O: GI bleed   . Headache(784.0)    occasionally  . Hepatitis    Hep B  . History of blood transfusion   . History of kidney stones   . Hypertension   . Hypothyroidism    takes Synthroid daily  . Impaired hearing   . Joint pain   . Joint swelling   . Mechanical complication of other vascular device, implant, and graft 10/08/2013  . Occasional numbness/prickling/tingling of fingers and toes   . Oligouria   . Pancreatitis 04/10/2012  . Peripheral edema   . Renal disorder    m, w, F   . Secondary hyperparathyroidism (HCC) 08/19/2016  . Sepsis (HCC) 06/14/2015  . Shoulder pain   . Vaginal bleeding 08/15/2015   Past Surgical History:  Past Surgical History:  Procedure Laterality Date  . ARTERIOVENOUS GRAFT PLACEMENT Left    forearm  . cataract surgery     bilateral  . CHOLECYSTECTOMY  12/26/2011  . CHOLECYSTECTOMY  12/26/2011   Procedure: LAPAROSCOPIC CHOLECYSTECTOMY;  Surgeon: Atilano Ina, MD,FACS;  Location: [facility] OR;  Service: General;  Laterality: N/A;  . ESOPHAGOGASTRODUODENOSCOPY  06/09/2011   Procedure: ESOPHAGOGASTRODUODENOSCOPY (EGD);  Surgeon: Theda Belfast, MD;  Location[facility] ENDOSCOPY;  Service: Endoscopy;  Laterality: N/A;  . I&D EXTREMITY Left 03/23/2015   Procedure: DRAINAGE OF LEFT ARM SEROMA;  Surgeon: Fransisco Hertz, MD;   [facility] OR;  Service: Vascular;  Laterality: Left;  . INSERTION OF DIALYSIS CATHETER Right 01/12/2015   Procedure: INSERTION OF DIALYSIS CATHETER;  Surgeon: Fransisco Hertz, MD;  [facility] OR;  Service: Vascular;  Laterality: Right;  . LIGATION ARTERIOVENOUS GORTEX GRAFT Left 03/23/2015   Procedure: LIGATION AND EXCISION OF LEFT ARTERIOVENOUS GORTEX GRAFT;  Surgeon: Fransisco Hertz, MD;  [facility] OR;  Service: Vascular;  Laterality: Left;  . ORIF HUMERUS FRACTURE Left 02/20/2017   Procedure: LEFT HUMERUS OPEN REDUCTION INTERNAL FIXATION;  Surgeon: Beverely Low, MD;  Location: [facility] OR;  Service: Orthopedics;  Laterality: Left;  . REVERSE SHOULDER ARTHROPLASTY  09/22/2011   Procedure: REVERSE SHOULDER ARTHROPLASTY;  Surgeon: Verlee Rossetti, MD;  Location: [facility] OR;  Service: Orthopedics;  Laterality: Left;  left reverse shoulder arthroplasty  . REVISION OF ARTERIOVENOUS GORETEX GRAFT Left 01/12/2015   Procedure: REVISION OF Left FOREARM ARTERIOVENOUS GORETEX GRAFT;  Surgeon: Fransisco Hertz, MD;  Location: [facility] OR;  Service: Vascular;  Laterality: Left;  . SHOULDER SURGERY  3-6yrs ago   right replacement  . SHUNTOGRAM Left 08/29/2012   Procedure: SHUNTOGRAM;  Surgeon: Fransisco Hertz, MD;  Location: [facility] CATH LAB;  Service: Cardiovascular;  Laterality: Left;  arm   HPI:  72 y.o. BF PMHx Dementia, ESRD on HD M/W/F  Hypothyroidism, pancreatitis, hepatitis B, diabetes type II uncontrolled with complication,  GI bleed, nephrolithiasis, HTN,recent fall, and decline in her physical and cognitive status over the past 2 weeks. Patient lives with her grandson who reports that she fell approximately 2 weeks ago, was evaluated in the emergency department with negative radiographs at that time, but has been bedbound since then, complaining of hip and pelvic pain.  Patient has been eating and drinking very little, essentially only taking Ensure for the past couple weeks and has been bedbound over this interval. CT reveals a new  hypodensity in the right parietal lobe suspicious for recent nonhemorrhagic infarct. Pt seen in 2018 after hip fx, required pureed foods, thin liquids, was dysphonic at the time.    Assessment / Plan / Recommendation Clinical Impression  Pt demonstrates cognitive based dysphagia, mild tactile and visual cueing needed for pt to use straw effectively. After that, intake was automatic without signs of aspiration. Pt unlikely to tolerate mastication given lack of dentition and confusion. Expect intake to be very poor as pt is distracted by pain and very inattentive. WIll initiate a dys 1 (puree) diet and thin liquids, f/u for tolerance.  SLP Visit Diagnosis: Dysphagia, oral phase (R13.11)    Aspiration Risk  Mild aspiration risk    Diet Recommendation Dysphagia 1 (Puree);Thin liquid   Liquid Administration via: Cup;Straw Medication Administration: Crushed with puree Supervision: Full supervision/cueing for compensatory strategies Compensations: Slow rate;Small sips/bites Postural Changes: Seated upright at 90 degrees    Other  Recommendations Oral Care Recommendations: Oral care BID   Follow up Recommendations Skilled Nursing facility      Frequency and Duration min 1 x/week  1 week       Prognosis        Swallow Study   General HPI: 72 y.o. BF PMHx Dementia, ESRD on HD M/W/F  Hypothyroidism, pancreatitis, hepatitis B, diabetes type II uncontrolled with complication, GI bleed, nephrolithiasis, HTN,recent fall, and decline in her physical and cognitive status over the past 2 weeks. Patient lives with her grandson who reports that she fell approximately 2 weeks ago, was evaluated in the emergency department with negative radiographs at that time, but has been bedbound since then, complaining of hip and pelvic pain.  Patient has been eating and drinking very little, essentially only taking Ensure for the past couple weeks and has been bedbound over this interval. CT reveals a new  hypodensity in the right parietal lobe suspicious for recent nonhemorrhagic infarct. Pt seen in 2018 after hip fx, required pureed foods, thin liquids, was dysphonic at the time.  Type of Study: Bedside Swallow Evaluation Previous Swallow Assessment: see HPI Diet Prior to this Study: NPO Temperature Spikes Noted: No Respiratory Status: Room air History of Recent Intubation: No Behavior/Cognition: Alert;Distractible;Doesn't follow directions;Confused Oral Cavity Assessment: Dried secretions;Dry Oral Care Completed by SLP: No Oral Cavity - Dentition: Edentulous Vision: Impaired for self-feeding Self-Feeding Abilities: Total assist Patient Positioning: Upright in bed Baseline Vocal Quality: Hoarse Volitional Cough: Cognitively unable to elicit Volitional Swallow: Unable to elicit    Oral/Motor/Sensory Function Overall Oral Motor/Sensory Function: Generalized oral weakness   Ice Chips     Thin Liquid Thin Liquid: Impaired Presentation: Straw Oral Phase Impairments: Poor awareness of bolus Pharyngeal  Phase Impairments: Suspected delayed Swallow    Nectar Thick Nectar Thick Liquid: Not tested   Honey Thick Honey Thick Liquid: Not tested   Puree Puree: Within functional limits   Solid   GO   Solid: Not tested        Sarha Bartelt, Riley NearingBonnie Caroline 10/04/2017,10:44  AM     

## 2017-10-04 NOTE — Progress Notes (Addendum)
Occupational Therapy Evaluation Patient Details Name: Samantha [name] MRN: [id number] DOB: 1945-06-14 Today's Date: 10/04/2017    History of Present Illness 72 y.o.BF PMHx Dementia, ESRD on HD M/W/F  Hypothyroidism, pancreatitis, hepatitis B, diabetes type II uncontrolled with complication, GI bleed, nephrolithiasis, L humerus fx with ORIF; REverse total shoulder,  HTN,recent fall, and decline in her physical and cognitive status over the past 2 weeks. Pt with acute encephalopathy and R parietal CVA.    Clinical Impression   PTA, pt living at home with grandson per chart. Unsure of PLOF, however in 02/2017, pt was completing her ADL tasks and ambulating at cane level. Per chart, pt has been "bedbound" for last 2 weeks since last fall. Pt currently requires +2 total A with bed mobility, total A with ADL and is not following commands. Pt in apparent discomfort - nsg made aware. At this time, pt will benefit from rehab at snf to maximize functional of independence. Will follow acutely to address established goals.  Staff will need to mobilize pt using Maximove.    Follow Up Recommendations  SNF;Supervision/Assistance - 24 hour    Equipment Recommendations  Other (comment)(TBA at SNF)    Recommendations for Other Services       Precautions / Restrictions Precautions Precautions: Fall Precaution Comments: at risk for skin breakdown Restrictions Weight Bearing Restrictions: No      Mobility Bed Mobility Overal bed mobility: Needs Assistance Bed Mobility: Supine to Sit;Sit to Supine;Rolling Rolling: Total assist;+2 for physical assistance   Supine to sit: Total assist;+2 for physical assistance Sit to supine: Total assist;+2 for physical assistance   General bed mobility comments: Total assist + 2 for all bed mobility, groaning throughout movement. Rolling to bilateral sides for peri care and bed pad change.   Transfers                 General transfer comment: not  attempted    Balance Overall balance assessment: Needs assistance   Sitting balance-Leahy Scale: Zero Sitting balance - Comments: posterior lean                                    ADL either performed or assessed with clinical judgement   ADL Overall ADL's : Needs assistance/impaired                                     Functional mobility during ADLs: Total assistance;+2 for physical assistance General ADL Comments: total A with all ADL at this time  Hearing Aid found in patient's bed - nsg made aware     Vision   Additional Comments: poor visaul attention and tracking; will further assess     Perception Perception Comments: impaired; will further assess   Praxis      Pertinent Vitals/Pain Pain Assessment: Faces Faces Pain Scale: Hurts whole lot Pain Location: groaning, agitated with any movement Pain Descriptors / Indicators: Grimacing;Guarding;Moaning Pain Intervention(s): Limited activity within patient's tolerance;Repositioned;Relaxation     Hand Dominance Right   Extremity/Trunk Assessment Upper Extremity Assessment Upper Extremity Assessment: Defer to OT evaluation   Lower Extremity Assessment Lower Extremity Assessment: RLE deficits/detail;LLE deficits/detail RLE Deficits / Details: Ankle dorsiflexion limited -5 degrees from neutral. Contusion noted on proximal lateral thigh. LLE Deficits / Details: Ankle dorsiflexion able to passively reach neutral   Cervical / Trunk Assessment  Cervical / Trunk Assessment: Other exceptions(L lateral lean/posterior bias)   Communication Communication Communication: HOH(will further assess; difficult to understand)   Cognition Arousal/Alertness: Lethargic Behavior During Therapy: Flat affect;Agitated Overall Cognitive Status: No family/caregiver present to determine baseline cognitive functioning                                 General Comments: Does not follow simple  commands or answer questions appropriately. Becomes increasingly agitated with movement secondary to pain.    General Comments  multiple areas of full thickness skin loss around sacrum; related to moisture not pressure injuries    Exercises     Shoulder Instructions      Home Living Family/patient expects to be discharged to:: Unsure Living Arrangements: Other relatives(grandson) Available Help at Discharge: Family Type of Home: Apartment Home Access: Level entry     Home Layout: One level     Bathroom Shower/Tub: IT trainerTub/shower unit;Curtain   Bathroom Toilet: Standard Bathroom Accessibility: Yes How Accessible: Accessible via walker Home Equipment: Cane - single point   Additional Comments: Per previous admission note - Lives with grandson, who works during the day; family plans to have assistance when family at work      Prior Functioning/Environment Level of Independence: Independent with assistive device(s)        Comments: pt uses a cane at all times; sponge baths and dresses herself; family assists with shower on Sundays(unsure of PLOF; bedbound for last 2 weeks PTA)        OT Problem List: Decreased strength;Decreased range of motion;Decreased activity tolerance;Impaired balance (sitting and/or standing);Decreased coordination;Impaired vision/perception;Decreased cognition;Decreased knowledge of use of DME or AE;Decreased safety awareness;Decreased knowledge of precautions;Cardiopulmonary status limiting activity;Impaired UE functional use;Pain;Increased edema      OT Treatment/Interventions: Self-care/ADL training;Therapeutic exercise;Energy conservation;DME and/or AE instruction;Therapeutic activities;Cognitive remediation/compensation;Visual/perceptual remediation/compensation;Patient/family education;Balance training    OT Goals(Current goals can be found in the care plan section) Acute Rehab OT Goals Patient Stated Goal: none stated Time For Goal Achievement:  10/18/17 Potential to Achieve Goals: Fair  OT Frequency: Min 2X/week   Barriers to D/C: Other (comment)(unsure of caregiver support)          Co-evaluation              AM-PAC PT "6 Clicks" Daily Activity     Outcome Measure Help from another person eating meals?: A Lot Help from another person taking care of personal grooming?: Total Help from another person toileting, which includes using toliet, bedpan, or urinal?: Total Help from another person bathing (including washing, rinsing, drying)?: Total Help from another person to put on and taking off regular upper body clothing?: Total Help from another person to put on and taking off regular lower body clothing?: Total 6 Click Score: 7   End of Session Equipment Utilized During Treatment: Oxygen(1.5 L) Nurse Communication: Mobility status;Need for lift equipment;Precautions  Activity Tolerance: Patient limited by lethargy;Patient limited by pain Patient left: in bed;with call bell/phone within reach;with bed alarm set;with SCD's reapplied  OT Visit Diagnosis: Other abnormalities of gait and mobility (R26.89);History of falling (Z91.81);Muscle weakness (generalized) (M62.81);Other symptoms and signs involving cognitive function;Pain Pain - part of body: (general discomfort)                Time: [id number] OT Time Calculation (min): 25 min Charges:  OT General Charges $OT Visit: 1 Visit OT Evaluation $OT Eval Moderate Complexity: 1 Mod G-Codes:  Luisa Dago, OT/L  OT Clinical Specialist [phone]   [facility] 10/04/2017, 11:42 AM

## 2017-10-04 NOTE — Consult Note (Signed)
NEURO HOSPITALIST CONSULT NOTE   Requestig physician: Dr. Joseph Art   Reason for Consult:AMS   History obtained from:  Chart d/t mental status  HPI:                                                                                                                                          Samantha [name] is an 72 y.o. female PMH significant for dementia, hypothyroidism, ESRD on hemodialysis  Per chart patient had a recent fall ( 2 weeks ago), and decline in her physical and cognitive status for the past two weeks. She has been eating very little, has been bed bound for the past two weeks.She also missed dialysis for AMS and lethargy. Patient lives with grandson who came to visit and he states that Samantha [name] was eating and oriented before this fall 2 weeks ago.   CT of head done 10/02/17  New area of hypodensity in the right parietal lobe suspicious for a recent nonhemorrhagic infarct, Chronic stable atrophy with small vessel ischemia, and Small left mastoid effusion. MRI and MRA of neck  (test was limited due to motion) No definite acute intracranial abnormality. Previously noted right parietal abnormality is consistent with a chronic right posterior MCA infarct, Grossly stable atrophy with chronic small vessel ischemic Patient placed on antibiotics prophylacticly until results of UA ( results still pending).  Past Medical History:  Diagnosis Date  . Anemia   . Arthritis    left shoulder  . Cellulitis of left upper extremity   . Constipation   . Dementia   . Diabetes mellitus without complication (HCC)    borderline  . Dizziness   . Dry skin   . Gastric ulcer   . GERD (gastroesophageal reflux disease)   . GI bleed 06/06/2011  . Gram-negative bacteremia   . H/O: GI bleed   . Headache(784.0)    occasionally  . Hepatitis    Hep B  . History of blood transfusion   . History of kidney stones   . Hypertension   . Hypothyroidism    takes Synthroid daily  . Impaired  hearing   . Joint pain   . Joint swelling   . Mechanical complication of other vascular device, implant, and graft 10/08/2013  . Occasional numbness/prickling/tingling of fingers and toes   . Oligouria   . Pancreatitis 04/10/2012  . Peripheral edema   . Renal disorder    m, w, F   . Secondary hyperparathyroidism (HCC) 08/19/2016  . Sepsis (HCC) 06/14/2015  . Shoulder pain   . Vaginal bleeding 08/15/2015    Past Surgical History:  Procedure Laterality Date  . ARTERIOVENOUS GRAFT PLACEMENT Left    forearm  . cataract surgery     bilateral  . CHOLECYSTECTOMY  12/26/2011  .  CHOLECYSTECTOMY  12/26/2011   Procedure: LAPAROSCOPIC CHOLECYSTECTOMY;  Surgeon: Atilano Ina, MD,FACS;  Location: [facility] OR;  Service: General;  Laterality: N/A;  . ESOPHAGOGASTRODUODENOSCOPY  06/09/2011   Procedure: ESOPHAGOGASTRODUODENOSCOPY (EGD);  Surgeon: Theda Belfast, MD;  Location: [facility] ENDOSCOPY;  Service: Endoscopy;  Laterality: N/A;  . I&D EXTREMITY Left 03/23/2015   Procedure: DRAINAGE OF LEFT ARM SEROMA;  Surgeon: Fransisco Hertz, MD;  Location: [facility] OR;  Service: Vascular;  Laterality: Left;  . INSERTION OF DIALYSIS CATHETER Right 01/12/2015   Procedure: INSERTION OF DIALYSIS CATHETER;  Surgeon: Fransisco Hertz, MD;  Location: [facility] OR;  Service: Vascular;  Laterality: Right;  . LIGATION ARTERIOVENOUS GORTEX GRAFT Left 03/23/2015   Procedure: LIGATION AND EXCISION OF LEFT ARTERIOVENOUS GORTEX GRAFT;  Surgeon: Fransisco Hertz, MD;  Location: [facility] OR;  Service: Vascular;  Laterality: Left;  . ORIF HUMERUS FRACTURE Left 02/20/2017   Procedure: LEFT HUMERUS OPEN REDUCTION INTERNAL FIXATION;  Surgeon: Beverely Low, MD;  Location: [facility] OR;  Service: Orthopedics;  Laterality: Left;  . REVERSE SHOULDER ARTHROPLASTY  09/22/2011   Procedure: REVERSE SHOULDER ARTHROPLASTY;  Surgeon: Verlee Rossetti, MD;  Location: [facility] OR;  Service: Orthopedics;  Laterality: Left;  left reverse shoulder arthroplasty  . REVISION OF ARTERIOVENOUS GORETEX GRAFT Left  01/12/2015   Procedure: REVISION OF Left FOREARM ARTERIOVENOUS GORETEX GRAFT;  Surgeon: Fransisco Hertz, MD;  Location: [facility] OR;  Service: Vascular;  Laterality: Left;  . SHOULDER SURGERY  3-73yrs ago   right replacement  . SHUNTOGRAM Left 08/29/2012   Procedure: SHUNTOGRAM;  Surgeon: Fransisco Hertz, MD;  Location: [facility] CATH LAB;  Service: Cardiovascular;  Laterality: Left;  arm    Family History  Problem Relation Age of Onset  . Hypertension Mother   . Diabetes Mother   . Cancer Brother        Social History:  reports that she has never smoked. Her smokeless tobacco use includes chew. She reports that she does not drink alcohol or use drugs.  Allergies  Allergen Reactions  . Aspirin Other (See Comments)    Per Md.  . Banana Other (See Comments)    Due to dialysis  . Chocolate Other (See Comments)    Due to dialysis  . Fish-Derived Products Other (See Comments)    Due to gout    MEDICATIONS:                                                                                                                     Scheduled: .  stroke: mapping our early stages of recovery book   Does not apply Once  . Chlorhexidine Gluconate Cloth  6 each Topical Q0600  . lactulose  30 g Oral BID  . levothyroxine  50 mcg Oral QAC breakfast  . midodrine  5 mg Oral TID WC  . sodium chloride flush  3 mL Intravenous Q12H  . sodium chloride flush  3 mL Intravenous Q12H   Continuous: . sodium chloride    .  dextrose 10 mL/hr at 10/04/17 0806   ZOX:WRUEAV chloride, acetaminophen **OR** acetaminophen, fentaNYL (SUBLIMAZE) injection, ondansetron **OR** ondansetron (ZOFRAN) IV, sodium chloride flush   ROS:                                                                                                                                       History obtained from unobtainable from patient due to mental status    Blood pressure 106/79, pulse 91, temperature (!) 97.4 F (36.3 C), temperature source Axillary, resp. rate  18, height 5\' 4"  (1.626 m), weight 77.1 kg (170 lb), SpO2 100 %.   General Examination:                                                                                                       Physical Exam  HEENT-  Normocephalic, no lesions, without obvious abnormality.  External eyes puffy bilaterally. normal conjunctiva.   Cardiovascular- S1-S2 audible, pulses palpable throughout   Lungs- no excessive working breathing.  Saturations within normal limits on 2 L [place] Extremities- Warm, dry and intact Musculoskeletal-RUE swelling and BLE swelling noted. No pitting edema noted. Skin-warm and excessively dry   Neurological Examination Mental Status: Alert, not oriented. Patient mumbles a lot, but she is able to speak normal words which are mostly " stop" "leave me alone and stop touching me" occasionally she will call her grandson her dead brothers name, Cranial Nerves: Patient able to track examiner left to right. PERRLA. Gag intact. Hearing intact. Motor/ Sensory Right : Upper extremity   4/5    Left:     Upper extremity   4/5  Lower extremity   3/5     Lower extremity   3/5 Tone and bulk:normal tone throughout; no atrophy noted Patient able to move all 4 extremities spontaneously and to noxious stimuli  Deep Tendon Reflexes:  symmetric throughout (though patient edematous in BLE and RUE). Plantars: Right: downgoing   Left: downgoing Cerebellar: UTA Gait: UTA   Lab Results: Basic Metabolic Panel: Recent Labs  Lab 10/02/17 1953 10/02/17 2032 10/03/17 0617 10/03/17 1218 10/03/17 2020 10/04/17 1320  NA 136 139 138  --   --  134*  K 6.8* 5.3* 4.8 4.8 4.7 3.8  CL 103 99* 98*  --   --  95*  CO2  --  24 26  --   --  30  GLUCOSE 84 84 122*  --   --  98  BUN 75* 52* 55*  --   --  20  CREATININE 8.00* 7.67* 7.96*  --   --  4.34*  CALCIUM  --  9.1 9.4  --   --  8.2*  MG  --   --   --   --   --  2.0  PHOS  --   --   --   --   --  2.4*    CBC: Recent Labs  Lab 10/02/17 1953  10/02/17 2032 10/02/17 2223 10/03/17 1218 10/04/17 1320  WBC  --  7.2  --  6.0 5.3  NEUTROABS  --  5.4  --  4.3  --   HGB 11.9* 10.1*  --  8.6* 7.7*  HCT 35.0* 34.6* 28.8* 28.3* 25.4*  MCV  --  107.8*  --  104.4* 103.7*  PLT  --  123*  --  127* 107*    Cardiac Enzymes: No results for input(s): CKTOTAL, CKMB, CKMBINDEX, TROPONINI in the last 168 hours.  Lipid Panel: Recent Labs  Lab 10/03/17 0500  CHOL 201*  TRIG 120  HDL 10*  CHOLHDL 20.1  VLDL 24  LDLCALC 167*    Imaging: Dg Chest 2 View  Result Date: 10/02/2017 CLINICAL DATA:  Patient found on floor. Decreased urinary output. Patient missed dialysis on Monday. EXAM: CHEST - 2 VIEW COMPARISON:  06/26/2017 FINDINGS: Stable cardiomegaly with aortic atherosclerosis. No aneurysm. Right IJ dialysis catheter tip terminates in the right atrium. No overt pulmonary edema, pulmonary consolidation or effusion. Minimal atelectasis at the left lung base. Partially included bilateral shoulder arthroplasties are redemonstrated without significant change. IMPRESSION: Stable mild cardiomegaly and aortic atherosclerosis. No active pulmonary disease. Electronically Signed   By: Tollie Eth M.D.   On: 10/02/2017 20:24   Ct Head Wo Contrast  Result Date: 10/02/2017 CLINICAL DATA:  Altered level of consciousness, patient has not been eating but only drinking Ensure. Decreased urinary output. EXAM: CT HEAD WITHOUT CONTRAST TECHNIQUE: Contiguous axial images were obtained from the base of the skull through the vertex without intravenous contrast. COMPARISON:  08/18/2016 FINDINGS: Brain: New confluent area of hypodensity involving the right high parietal lobe suspicious for recent infarct. No hemorrhagic component is identified. Stable mild superficial and moderate central atrophy is noted. No effacement of the basal cisterns or fourth ventricle. No intra-axial mass nor extra-axial fluid collections. Chronic small vessel ischemic disease of  periventricular subcortical white matter. Vascular: Moderate atherosclerosis of the carotid siphons. Skull: No skull fracture or suspicious osseous lesions. Sinuses/Orbits: No acute paranasal sinus disease. Intact orbits. Bilateral lens replacements. Other: Small left mastoid effusion with partial opacification of the left mastoid. Osteoarthritis of the included TMJ though assessment is limited due to motion artifacts. IMPRESSION: 1. New area of hypodensity in the right parietal lobe suspicious for a recent nonhemorrhagic infarct. 2. Chronic stable atrophy with small vessel ischemia. 3. Small left mastoid effusion. Electronically Signed   By: Tollie Eth M.D.   On: 10/02/2017 20:53   Mr Maxine Glenn Neck Wo Contrast  Result Date: 10/03/2017 CLINICAL DATA:  Initial evaluation for acute altered mental status. EXAM: MRI HEAD WITHOUT CONTRAST MRA NECK WITHOUT CONTRAST TECHNIQUE: Multiplanar, multiecho pulse sequences of the brain and surrounding structures were obtained without intravenous contrast. COMPARISON:  Prior CT from 10/02/2017 FINDINGS: MRI BRAIN FINDINGS Brain: Study severely degraded by motion artifact, limiting evaluation. Generalized age-related cerebral atrophy. Chronic microvascular ischemic changes grossly stable. Encephalomalacia within the right parietal lobe consistent with remote right posterior MCA territory infarct. This corresponds with abnormality seen on prior CT. No definite abnormal  foci of restricted diffusion to suggest acute or subacute ischemia. Gray-white matter differentiation otherwise grossly maintained. Additional small remote right cerebellar infarct noted. No obvious intracranial hemorrhage. No appreciable mass lesion. No mass effect or midline shift. Diffuse ventricular prominence similar to previous examinations. Appreciable extra-axial fluid collection. Vascular: Major intracranial vascular flow voids grossly maintained at the skull base. Skull and upper cervical spine:  Craniocervical junction poorly evaluated on this motion degraded exam. Bone marrow signal intensity grossly within normal limits. No obvious focal osseous lesion. No scalp soft tissue abnormality. Sinuses/Orbits: Globes and orbital soft tissues demonstrate no definite acute abnormality. Patient status post lens extraction bilaterally. Paranasal sinuses are grossly clear. Left mastoid effusion noted. Other: None. MRA NECK FINDINGS Study severely limited by extensive motion artifact and lack of IV contrast. Aortic arch and origin of the great vessels not assessed on this exam. Common and internal carotid arteries grossly patent within the neck. Although note definite high-grade stenosis identified, evaluation fairly limited visualized vertebral arteries grossly patent with antegrade flow. Evaluation for possible vertebral artery stenosis fairly limited. IMPRESSION: MRI HEAD IMPRESSION 1. Severely limited exam due to extensive motion artifact. 2. No definite acute intracranial abnormality. Previously noted right parietal abnormality is consistent with a chronic right posterior MCA infarct. 3. Grossly stable atrophy with chronic small vessel ischemic. MRA NECK IMPRESSION 1. Severely limited and nearly nondiagnostic examination to extensive motion artifact and lack of IV contrast. 2. Major arterial vasculature of the neck grossly patent without occlusion. Evaluation for discrete stenosis limited on this exam. Electronically Signed   By: Rise Mu M.D.   On: 10/03/2017 22:09   Mr Brain Wo Contrast  Result Date: 10/03/2017 CLINICAL DATA:  Initial evaluation for acute altered mental status. EXAM: MRI HEAD WITHOUT CONTRAST MRA NECK WITHOUT CONTRAST TECHNIQUE: Multiplanar, multiecho pulse sequences of the brain and surrounding structures were obtained without intravenous contrast. COMPARISON:  Prior CT from 10/02/2017 FINDINGS: MRI BRAIN FINDINGS Brain: Study severely degraded by motion artifact, limiting  evaluation. Generalized age-related cerebral atrophy. Chronic microvascular ischemic changes grossly stable. Encephalomalacia within the right parietal lobe consistent with remote right posterior MCA territory infarct. This corresponds with abnormality seen on prior CT. No definite abnormal foci of restricted diffusion to suggest acute or subacute ischemia. Gray-white matter differentiation otherwise grossly maintained. Additional small remote right cerebellar infarct noted. No obvious intracranial hemorrhage. No appreciable mass lesion. No mass effect or midline shift. Diffuse ventricular prominence similar to previous examinations. Appreciable extra-axial fluid collection. Vascular: Major intracranial vascular flow voids grossly maintained at the skull base. Skull and upper cervical spine: Craniocervical junction poorly evaluated on this motion degraded exam. Bone marrow signal intensity grossly within normal limits. No obvious focal osseous lesion. No scalp soft tissue abnormality. Sinuses/Orbits: Globes and orbital soft tissues demonstrate no definite acute abnormality. Patient status post lens extraction bilaterally. Paranasal sinuses are grossly clear. Left mastoid effusion noted. Other: None. MRA NECK FINDINGS Study severely limited by extensive motion artifact and lack of IV contrast. Aortic arch and origin of the great vessels not assessed on this exam. Common and internal carotid arteries grossly patent within the neck. Although note definite high-grade stenosis identified, evaluation fairly limited visualized vertebral arteries grossly patent with antegrade flow. Evaluation for possible vertebral artery stenosis fairly limited. IMPRESSION: MRI HEAD IMPRESSION 1. Severely limited exam due to extensive motion artifact. 2. No definite acute intracranial abnormality. Previously noted right parietal abnormality is consistent with a chronic right posterior MCA infarct. 3. Grossly stable atrophy with chronic  small vessel ischemic. MRA NECK IMPRESSION 1. Severely limited and nearly nondiagnostic examination to extensive motion artifact and lack of IV contrast. 2. Major arterial vasculature of the neck grossly patent without occlusion. Evaluation for discrete stenosis limited on this exam. Electronically Signed   By: Rise MuBenjamin  McClintock M.D.   On: 10/03/2017 22:09   Dg Hips Bilat With Pelvis 2v  Result Date: 10/02/2017 CLINICAL DATA:  72 year old female status post fall last week, and today. Found down. EXAM: DG HIP (WITH OR WITHOUT PELVIS) 2V BILAT COMPARISON:  CT Abdomen and Pelvis 07/22/2017. FINDINGS: Portable AP and frog-leg lateral views of the pelvis. The femoral heads are normally located and hip joint spaces are normal for age. Both proximal femurs appear intact. No pelvis fracture identified. Stool ball in the rectum. Iliofemoral calcified atherosclerosis. IMPRESSION: No hip fracture or acute osseous abnormality identified. Electronically Signed   By: Odessa FlemingH  Hall M.D.   On: 10/02/2017 22:59      Impression:  Samantha [name] is an 72 y.o. female PMH significant for dementia, hypothyroidism, ESRD on hemodialysis [facility]. Patient fell 2 weeks ago and  Since then has not been eating much and has been bed bound. CT of head done 10/02/17  New area of hypodensity in the right parietal lobe suspicious for a recent nonhemorrhagic infarct not seen on head CT 1 year prior 08-18-16. MRI Brain shows no acute infarct and MRA of neck shows no significant stenosis.  (test was limited due to motion) No definite acute intracranial abnormality. Previously noted right parietal abnormality is consistent with a chronic right posterior MCA infarct,  AMS most likely d/t electrolyte imbalance in the setting of ESRD vs  Elevated TSH vs delirium with underlying dementia.   Encephalopathy - multifactorial (uremia, possible infection, delirium, hypothyroidosm)  Chronic cortical  infarction   Recommendations: --  Start thiamine 100 mg daily --Echo --Carotid doppler  -- Manage TSH and electrolytes (primary team)  Valentina LucksJessica Williams, MSN, NP-C [facility] [phone]  Attending neurologist's note to follow   10/04/2017, 5:10 PM  NEUROHOSPITALIST ADDENDUM Seen and examined the patient today. I have reviewed the contents of history and physical exam as documented by PA/ARNP/Resident and agree with above documentation.  I have discussed and formulated the above plan as documented. Edits to the note have been made as needed.    Georgiana SpinnerSushanth Aroor MD [facility] [phone]   If 7pm to 7am, please call on call as listed on AMION.

## 2017-10-04 NOTE — Progress Notes (Signed)
Hypoglycemic Event  CBG:66  Treatment: D50%x1  Symptoms: Follow-up CBG Time:0205 CBG Result: 76 Possible Reasons for Event: NPO  MD Lucretia RoersWood has been awared and received new orders. Will continue to monitor closely.  Wilba Mutz L Kahlel Peake

## 2017-10-04 NOTE — Progress Notes (Signed)
PROGRESS NOTE    Samantha [name]  ZOX:[id number] DOB: 07/13/1945 DOA: 10/02/2017 PCP: Fleet Contras, MD   Brief Narrative:  72 y.o. BF PMHx Dementia, ESRD on HD M/W/F  Hypothyroidism, pancreatitis, hepatitis B, diabetes type II uncontrolled with complication, GI bleed, nephrolithiasis, HTN,recent fall, and decline in her physical and cognitive status over the past 2 weeks or so, now  Presenting to the emergency department with altered mental status after being found on the ground at home.  Patient lives with her grandson who reports that she fell approximately 2 weeks ago, was evaluated in the emergency department with negative radiographs at that time, but has been bedbound since then, complaining of hip and pelvic pain.  Patient has been eating and drinking very little, essentially only taking Ensure for the past couple weeks and has been bedbound over this interval.  Patient's grandson reports that she was in her bed when he went to the store, but found on the floor next to her bed when he returned several minutes later.  She missed her last dialysis session due to altered mental status and lethargy.   ED Course: Upon arrival to the ED, patient is found to have a temp of 36.1 C, saturating low 90s on room air, slightly tachycardic, and with blood pressure 103/73.  EKG features a sinus rhythm, chest x-ray is notable for stable cardiomegaly but no acute findings, and noncontrast head CT reveals a new hypodensity in the right parietal lobe suspicious for recent nonhemorrhagic infarct.  Chemistry panel features a potassium of 6.8.  Ammonia is elevated to 59, lactic acid elevated to 3.42, and hemoglobin stable at 10.1 with MCV of 107.8.  Blood cultures were collected and the patient was treated with vancomycin, Zosyn, bicarbonate, insulin with dextrose, and IV calcium in the ED.  Nephrology was consulted by the ED physician and will arrange urgent dialysis.  Patient will be admitted for ongoing evaluation  and management of hyperkalemia and acute encephalopathy with likely recent stroke and hyperammonemia, as well as possible infection.    Subjective: 6/20 somnolent, difficult to arouse.  Once aroused yells out which pushes you away noncompliant with exam.  Per NEPHROLOGY staff baseline usually very talkative pleasantly demented    Assessment & Plan:   Principal Problem:   Hyperkalemia Active Problems:   ESRD on hemodialysis (HCC)   Anemia in chronic kidney disease (CKD)   Acute encephalopathy   Stroke (cerebrum) (HCC)   Hypothyroidism   Hip pain   Hypoglycemia without diagnosis of diabetes mellitus  Acute Encephalopathy - Found down at home - Reportedly AMS for several days to weeks - Found to have likely ischemic infarct on CT, ammonia elevated, hypoglycemic -6/20 continue encephalopathy discussed case with PA Felicie Morn Neurology recommends EEG.  Will place order and will see patient.  RIGHT parietal lobe CVA - 6/18 CT head: Suspicious for stroke see results below -Lipid panel pending - Controlled BP.  Allow permissive HTN given new CVA -SBP goal 140-180, will tolerate as high as 200 - Initiate stroke work-up: MR MRA neck pending, MRI brain pending, ultrasound carotid Doppler pending Echocardiogram pending -PT/OT consult pending  Acute encephalopathy -Multifactorial, CVA, missing HD session, acute on chronic dementia.  Hypothyroidism, elevated ammonia? -Work-up underlying causes and correct.  Elevated ammonia - Only slightly elevated on likely cause. - Lactulose 30 g x 2 doses.  Although unlikely because we will correct ammonia level.  Hypotensive -Patient's BPs have been running low if patient to be having acute/subacute CVA -  6/20 midodrine 5 mg TID  Hyperkalemia; ESRD HD M/W/F  - Presents with AMS after being found down at home - Potassium noted to be 6.8 on arrival to ED  - She missed her last HD session d/t AMS and was last dialyzed on 6/14  - Nephrology is  arranging urgent HD and temporizing measures given in ED    Hypophosphatemia  -Patient with significant hypothyroidism have increased Synthroid will need to monitor phosphorus levels closely.  -Slightly low today, since patient dialysis patient will not replace until Friday   Hypothyroidism  -TSH significantly elevated at 24 -Obtain free T4 -Increase Synthroid 75 mcg daily, will need to recheck TSH in 6 to 8 weeks - We will need to closely while monitor electrolyte levels especially phosphorus  GI bleed -Patient occult blood positive, positive diarrhea   Hip pain/gait difficulty - Patient reportedly bedbound since fall 2 weeks ago complaining of hip pain -DG hips bilateral negative fracture.  Most likely soft tissue injury   Hypoglycemia  -Resolved  SIRS -On admission DOES NOT meet criteria for SIRS - DC empiric antibiotics      DVT prophylaxis: SCD Code Status: DNR Family Communication: None Disposition Plan: TBD   Consultants:  Nephrology     Procedures/Significant Events:  6/18 CT head W. Wo contrast:Brain:-New confluent area of hypodensity involving the right high parietal lobe suspicious for recent infarct.  -Stable mild superficial and moderate central atrophy is noted.   - Small left mastoid effusion with partial opacification of the left mastoid.  6/18 MRA Brain/Neck:MRI HEAD IMPRESSION -Severely limited scan secondary to motion artifact -No definite intracranial abnormality - Previously noted RIGHT parietal abnormality consistent with Right Posterior MCA Infarct  - Major arterial vasculature of the neck grossly patent withoutocclusion. Evaluation for discrete stenosis limited on this exam.     I have personally reviewed and interpreted all radiology studies and my findings are as above.  VENTILATOR SETTINGS:    Cultures 6/19 blood pending 6/19 RPR nonreactive     Antimicrobials: Anti-infectives (From admission, onward)   Start     Stop    10/03/17 1502  Vancomycin (VANCOCIN) 750-5 MG/150ML-% IVPB    Note to Pharmacy:  Kandis Ban  : cabinet override   10/03/17 1516   10/03/17 1200  vancomycin (VANCOCIN) IVPB 750 mg/150 ml premix     10/03/17 1612   10/03/17 0830  piperacillin-tazobactam (ZOSYN) IVPB 3.375 g  Status:  Discontinued     10/03/17 1752   10/03/17 0430  piperacillin-tazobactam (ZOSYN) IVPB 2.25 g  Status:  Discontinued     10/02/17 2022   10/02/17 2030  piperacillin-tazobactam (ZOSYN) IVPB 3.375 g     10/02/17 2141   10/02/17 2030  vancomycin (VANCOCIN) 2,000 mg in sodium chloride 0.9 % 500 mL IVPB     10/02/17 2323       Devices    LINES / TUBES:      Continuous Infusions: . sodium chloride    . dextrose 10 mL/hr at 10/04/17 0806     Objective: Vitals:   10/03/17 1707 10/03/17 2003 10/03/17 2300 10/04/17 0801  BP: 124/86  115/79 (!) 73/61  Pulse: 95 (!) 105 98 85  Resp: 17   18  Temp: 98.2 F (36.8 C) 98.3 F (36.8 C)    TempSrc: Axillary Oral    SpO2: 100% 91%    Weight:      Height:        Intake/Output Summary (Last 24 hours) at 10/04/2017 [phone]  Last data filed at 10/03/2017 1900 Gross per 24 hour  Intake 0 ml  Output 700 ml  Net -700 ml   Filed Weights   10/02/17 1843  Weight: 170 lb (77.1 kg)    Physical Exam:  General: A/O x0 does not follow commands, babbles incoherently No acute respiratory distress Neck:  Negative scars, masses, torticollis, lymphadenopathy, JVD Lungs: Clear to auscultation bilaterally without wheezes or crackles Cardiovascular: Regular rate and rhythm without murmur gallop or rub normal S1 and S2 Abdomen: negative abdominal pain, nondistended, positive soft, bowel sounds, no rebound, no ascites, no appreciable mass Extremities: No significant cyanosis, clubbing, or edema bilateral lower extremities Skin: Negative rashes, lesions, ulcers Psychiatric: Cannot evaluate secondary to altered mental status  Central nervous system: Spontaneously  moves all extremities withdraws to painful stimuli unable to assess further secondary to altered mental status    .     Data Reviewed: Care during the described time interval was provided by me .  I have reviewed this patient's available data, including medical history, events of note, physical examination, and all test results as part of my evaluation.   CBC: Recent Labs  Lab 10/02/17 1953 10/02/17 2032 10/03/17 1218  WBC  --  7.2 6.0  NEUTROABS  --  5.4 4.3  HGB 11.9* 10.1* 8.6*  HCT 35.0* 34.6* 28.3*  MCV  --  107.8* 104.4*  PLT  --  123* 127*   Basic Metabolic Panel: Recent Labs  Lab 10/02/17 1953 10/02/17 2032 10/03/17 0617 10/03/17 1218 10/03/17 2020  NA 136 139 138  --   --   K 6.8* 5.3* 4.8 4.8 4.7  CL 103 99* 98*  --   --   CO2  --  24 26  --   --   GLUCOSE 84 84 122*  --   --   BUN 75* 52* 55*  --   --   CREATININE 8.00* 7.67* 7.96*  --   --   CALCIUM  --  9.1 9.4  --   --    GFR: Estimated Creatinine Clearance: 6.3 mL/min (A) (by C-G formula based on SCr of 7.96 mg/dL (H)). Liver Function Tests: Recent Labs  Lab 10/02/17 2032 10/03/17 0617  AST 53* 42*  ALT 27 24  ALKPHOS 118 110  BILITOT 1.8* 2.1*  PROT 8.0 7.5  ALBUMIN 2.0* 1.9*   No results for input(s): LIPASE, AMYLASE in the last 168 hours. Recent Labs  Lab 10/02/17 2032  AMMONIA 59*   Coagulation Profile: No results for input(s): INR, PROTIME in the last 168 hours. Cardiac Enzymes: No results for input(s): CKTOTAL, CKMB, CKMBINDEX, TROPONINI in the last 168 hours. BNP (last 3 results) No results for input(s): PROBNP in the last 8760 hours. HbA1C: Recent Labs    10/03/17 1218  HGBA1C 4.0*   CBG: Recent Labs  Lab 10/03/17 0749 10/03/17 1122 10/04/17 0024 10/04/17 0205 10/04/17 0453  GLUCAP 114* 86 66 79 87   Lipid Profile: Recent Labs    10/03/17 0500  CHOL 201*  HDL 10*  LDLCALC 167*  TRIG 120  CHOLHDL 20.1   Thyroid Function Tests: Recent Labs     10/03/17 0741 10/03/17 2020  TSH 24.119*  --   FREET4  --  1.18   Anemia Panel: Recent Labs    10/03/17 0741  VITAMINB12 2,130*   Urine analysis:    Component Value Date/Time   COLORURINE AMBER (A) 08/03/2014 1249   APPEARANCEUR CLOUDY (A) 08/03/2014 1249   LABSPEC  1.020 08/03/2014 1249   PHURINE 7.0 08/03/2014 1249   GLUCOSEU 500 (A) 08/03/2014 1249   HGBUR LARGE (A) 08/03/2014 1249   BILIRUBINUR MODERATE (A) 08/03/2014 1249   KETONESUR 15 (A) 08/03/2014 1249   PROTEINUR >300 (A) 08/03/2014 1249   UROBILINOGEN 4.0 (H) 08/03/2014 1249   NITRITE POSITIVE (A) 08/03/2014 1249   LEUKOCYTESUR TRACE (A) 08/03/2014 1249   Sepsis Labs: @LABRCNTIP (procalcitonin:4,lacticidven:4)  )No results found for this or any previous visit (from the past 240 hour(s)).       Radiology Studies: Dg Chest 2 View  Result Date: 10/02/2017 CLINICAL DATA:  Patient found on floor. Decreased urinary output. Patient missed dialysis on Monday. EXAM: CHEST - 2 VIEW COMPARISON:  06/26/2017 FINDINGS: Stable cardiomegaly with aortic atherosclerosis. No aneurysm. Right IJ dialysis catheter tip terminates in the right atrium. No overt pulmonary edema, pulmonary consolidation or effusion. Minimal atelectasis at the left lung base. Partially included bilateral shoulder arthroplasties are redemonstrated without significant change. IMPRESSION: Stable mild cardiomegaly and aortic atherosclerosis. No active pulmonary disease. Electronically Signed   By: Tollie Eth M.D.   On: 10/02/2017 20:24   Ct Head Wo Contrast  Result Date: 10/02/2017 CLINICAL DATA:  Altered level of consciousness, patient has not been eating but only drinking Ensure. Decreased urinary output. EXAM: CT HEAD WITHOUT CONTRAST TECHNIQUE: Contiguous axial images were obtained from the base of the skull through the vertex without intravenous contrast. COMPARISON:  08/18/2016 FINDINGS: Brain: New confluent area of hypodensity involving the right high  parietal lobe suspicious for recent infarct. No hemorrhagic component is identified. Stable mild superficial and moderate central atrophy is noted. No effacement of the basal cisterns or fourth ventricle. No intra-axial mass nor extra-axial fluid collections. Chronic small vessel ischemic disease of periventricular subcortical white matter. Vascular: Moderate atherosclerosis of the carotid siphons. Skull: No skull fracture or suspicious osseous lesions. Sinuses/Orbits: No acute paranasal sinus disease. Intact orbits. Bilateral lens replacements. Other: Small left mastoid effusion with partial opacification of the left mastoid. Osteoarthritis of the included TMJ though assessment is limited due to motion artifacts. IMPRESSION: 1. New area of hypodensity in the right parietal lobe suspicious for a recent nonhemorrhagic infarct. 2. Chronic stable atrophy with small vessel ischemia. 3. Small left mastoid effusion. Electronically Signed   By: Tollie Eth M.D.   On: 10/02/2017 20:53   Mr Maxine Glenn Neck Wo Contrast  Result Date: 10/03/2017 CLINICAL DATA:  Initial evaluation for acute altered mental status. EXAM: MRI HEAD WITHOUT CONTRAST MRA NECK WITHOUT CONTRAST TECHNIQUE: Multiplanar, multiecho pulse sequences of the brain and surrounding structures were obtained without intravenous contrast. COMPARISON:  Prior CT from 10/02/2017 FINDINGS: MRI BRAIN FINDINGS Brain: Study severely degraded by motion artifact, limiting evaluation. Generalized age-related cerebral atrophy. Chronic microvascular ischemic changes grossly stable. Encephalomalacia within the right parietal lobe consistent with remote right posterior MCA territory infarct. This corresponds with abnormality seen on prior CT. No definite abnormal foci of restricted diffusion to suggest acute or subacute ischemia. Gray-white matter differentiation otherwise grossly maintained. Additional small remote right cerebellar infarct noted. No obvious intracranial hemorrhage.  No appreciable mass lesion. No mass effect or midline shift. Diffuse ventricular prominence similar to previous examinations. Appreciable extra-axial fluid collection. Vascular: Major intracranial vascular flow voids grossly maintained at the skull base. Skull and upper cervical spine: Craniocervical junction poorly evaluated on this motion degraded exam. Bone marrow signal intensity grossly within normal limits. No obvious focal osseous lesion. No scalp soft tissue abnormality. Sinuses/Orbits: Globes and orbital soft tissues demonstrate  no definite acute abnormality. Patient status post lens extraction bilaterally. Paranasal sinuses are grossly clear. Left mastoid effusion noted. Other: None. MRA NECK FINDINGS Study severely limited by extensive motion artifact and lack of IV contrast. Aortic arch and origin of the great vessels not assessed on this exam. Common and internal carotid arteries grossly patent within the neck. Although note definite high-grade stenosis identified, evaluation fairly limited visualized vertebral arteries grossly patent with antegrade flow. Evaluation for possible vertebral artery stenosis fairly limited. IMPRESSION: MRI HEAD IMPRESSION 1. Severely limited exam due to extensive motion artifact. 2. No definite acute intracranial abnormality. Previously noted right parietal abnormality is consistent with a chronic right posterior MCA infarct. 3. Grossly stable atrophy with chronic small vessel ischemic. MRA NECK IMPRESSION 1. Severely limited and nearly nondiagnostic examination to extensive motion artifact and lack of IV contrast. 2. Major arterial vasculature of the neck grossly patent without occlusion. Evaluation for discrete stenosis limited on this exam. Electronically Signed   By: Rise MuBenjamin  McClintock M.D.   On: 10/03/2017 22:09   Mr Brain Wo Contrast  Result Date: 10/03/2017 CLINICAL DATA:  Initial evaluation for acute altered mental status. EXAM: MRI HEAD WITHOUT CONTRAST MRA  NECK WITHOUT CONTRAST TECHNIQUE: Multiplanar, multiecho pulse sequences of the brain and surrounding structures were obtained without intravenous contrast. COMPARISON:  Prior CT from 10/02/2017 FINDINGS: MRI BRAIN FINDINGS Brain: Study severely degraded by motion artifact, limiting evaluation. Generalized age-related cerebral atrophy. Chronic microvascular ischemic changes grossly stable. Encephalomalacia within the right parietal lobe consistent with remote right posterior MCA territory infarct. This corresponds with abnormality seen on prior CT. No definite abnormal foci of restricted diffusion to suggest acute or subacute ischemia. Gray-white matter differentiation otherwise grossly maintained. Additional small remote right cerebellar infarct noted. No obvious intracranial hemorrhage. No appreciable mass lesion. No mass effect or midline shift. Diffuse ventricular prominence similar to previous examinations. Appreciable extra-axial fluid collection. Vascular: Major intracranial vascular flow voids grossly maintained at the skull base. Skull and upper cervical spine: Craniocervical junction poorly evaluated on this motion degraded exam. Bone marrow signal intensity grossly within normal limits. No obvious focal osseous lesion. No scalp soft tissue abnormality. Sinuses/Orbits: Globes and orbital soft tissues demonstrate no definite acute abnormality. Patient status post lens extraction bilaterally. Paranasal sinuses are grossly clear. Left mastoid effusion noted. Other: None. MRA NECK FINDINGS Study severely limited by extensive motion artifact and lack of IV contrast. Aortic arch and origin of the great vessels not assessed on this exam. Common and internal carotid arteries grossly patent within the neck. Although note definite high-grade stenosis identified, evaluation fairly limited visualized vertebral arteries grossly patent with antegrade flow. Evaluation for possible vertebral artery stenosis fairly limited.  IMPRESSION: MRI HEAD IMPRESSION 1. Severely limited exam due to extensive motion artifact. 2. No definite acute intracranial abnormality. Previously noted right parietal abnormality is consistent with a chronic right posterior MCA infarct. 3. Grossly stable atrophy with chronic small vessel ischemic. MRA NECK IMPRESSION 1. Severely limited and nearly nondiagnostic examination to extensive motion artifact and lack of IV contrast. 2. Major arterial vasculature of the neck grossly patent without occlusion. Evaluation for discrete stenosis limited on this exam. Electronically Signed   By: Rise MuBenjamin  McClintock M.D.   On: 10/03/2017 22:09   Dg Hips Bilat With Pelvis 2v  Result Date: 10/02/2017 CLINICAL DATA:  [id number] year old female status post fall last week, and today. Found down. EXAM: DG HIP (WITH OR WITHOUT PELVIS) 2V BILAT COMPARISON:  CT Abdomen and Pelvis 07/22/2017. FINDINGS: Portable  AP and frog-leg lateral views of the pelvis. The femoral heads are normally located and hip joint spaces are normal for age. Both proximal femurs appear intact. No pelvis fracture identified. Stool ball in the rectum. Iliofemoral calcified atherosclerosis. IMPRESSION: No hip fracture or acute osseous abnormality identified. Electronically Signed   By: Odessa Fleming M.D.   On: 10/02/2017 22:59        Scheduled Meds: .  stroke: mapping our early stages of recovery book   Does not apply Once  . Chlorhexidine Gluconate Cloth  6 each Topical Q0600  . levothyroxine  50 mcg Oral QAC breakfast  . sodium chloride flush  3 mL Intravenous Q12H  . sodium chloride flush  3 mL Intravenous Q12H   Continuous Infusions: . sodium chloride    . dextrose 10 mL/hr at 10/04/17 0806     LOS: 2 days    Time spent: 40 minutes    Basha Krygier, Roselind Messier, MD [facility] Pager [phone]   If 7PM-7AM, please contact night-coverage [email or web address] Password [facility] 10/04/2017, 8:39 AM

## 2017-10-04 NOTE — Consult Note (Addendum)
WOC Nurse wound consult note Reason for Consult: Consult requested for bilat buttocks and sacrum and inner groin near rectum. Wound type: Multiple patchy areas of full thickness skin loss across bilat buttocks and sacrum; appearance is consistent with moisture associated skin damage related to constant diarrhea, NOT pressure injuries.  Affected area is approx 6X7X.1cm; red and moist.  It is difficult to keep the area from becoming soiled related to constant incontinent loose stools.  Flexiseal is contraindicated since stools appear to have some blood. Present on admission, not R/T pressure Dressing procedure/placement/frequency: Foam dressing to protect and promote healing, barrier cream to repel moisture.  No family members present to discuss plan of care. Please re-consult if further assistance is needed.  Thank-you,  Cammie Mcgeeawn Rudy Luhmann MSN, RN, CWOCN, [place]-AP, CNS [phone]

## 2017-10-04 NOTE — Progress Notes (Signed)
Txt paged MD lab was unsuccessful in getting patients lab. Earlier they had to use the HD cath.

## 2017-10-04 NOTE — Progress Notes (Signed)
Patient refused to let BP check.

## 2017-10-05 ENCOUNTER — Other Ambulatory Visit (HOSPITAL_COMMUNITY): Payer: Medicare Other

## 2017-10-05 LAB — CBC
HEMATOCRIT: 27.2 % — AB (ref 36.0–46.0)
Hemoglobin: 8.2 g/dL — ABNORMAL LOW (ref 12.0–15.0)
MCH: 31.8 pg (ref 26.0–34.0)
MCHC: 30.1 g/dL (ref 30.0–36.0)
MCV: 105.4 fL — ABNORMAL HIGH (ref 78.0–100.0)
PLATELETS: 142 10*3/uL — AB (ref 150–400)
RBC: 2.58 MIL/uL — ABNORMAL LOW (ref 3.87–5.11)
RDW: 20.3 % — AB (ref 11.5–15.5)
WBC: 6.1 10*3/uL (ref 4.0–10.5)

## 2017-10-05 LAB — GLUCOSE, CAPILLARY
GLUCOSE-CAPILLARY: 87 mg/dL (ref 65–99)
GLUCOSE-CAPILLARY: 89 mg/dL (ref 65–99)
GLUCOSE-CAPILLARY: 108 mg/dL — AB (ref 65–99)
Glucose-Capillary: 13 mg/dL — CL (ref 65–99)
Glucose-Capillary: 59 mg/dL — ABNORMAL LOW (ref 65–99)
Glucose-Capillary: 65 mg/dL (ref 65–99)
Glucose-Capillary: 88 mg/dL (ref 65–99)

## 2017-10-05 LAB — RENAL FUNCTION PANEL
Albumin: 1.7 g/dL — ABNORMAL LOW (ref 3.5–5.0)
Anion gap: 11 (ref 5–15)
BUN: 24 mg/dL — ABNORMAL HIGH (ref 6–20)
CALCIUM: 8.6 mg/dL — AB (ref 8.9–10.3)
CO2: 28 mmol/L (ref 22–32)
Chloride: 95 mmol/L — ABNORMAL LOW (ref 101–111)
Creatinine, Ser: 5.52 mg/dL — ABNORMAL HIGH (ref 0.44–1.00)
GFR calc Af Amer: 8 mL/min — ABNORMAL LOW (ref >60)
GFR calc non Af Amer: 7 mL/min — ABNORMAL LOW (ref >60)
GLUCOSE: 112 mg/dL — AB (ref 65–99)
Phosphorus: 3 mg/dL (ref 2.5–4.6)
Potassium: 3.6 mmol/L (ref 3.5–5.1)
SODIUM: 134 mmol/L — AB (ref 135–145)

## 2017-10-05 LAB — MAGNESIUM: Magnesium: 2.3 mg/dL (ref 1.7–2.4)

## 2017-10-05 MED ORDER — ALBUMIN HUMAN 25 % IV SOLN
INTRAVENOUS | Status: AC
  Administered 2017-10-05: 25 g via INTRAVENOUS
  Filled 2017-10-05: qty 100

## 2017-10-05 MED ORDER — FENTANYL CITRATE (PF) 100 MCG/2ML IJ SOLN: 12.5000 ug | INTRAMUSCULAR | Status: DC | PRN

## 2017-10-05 MED ORDER — ALBUMIN HUMAN 25 % IV SOLN
25.0000 g | Freq: Once | INTRAVENOUS | Status: AC
  Administered 2017-10-05: 25 g via INTRAVENOUS

## 2017-10-05 NOTE — Care Management Important Message (Signed)
Important Message  Patient Details  Name: Samantha [name] MRN: [id number] Date of Birth: [birth year]   Medicare Important Message Given:  Yes Patient unable to sign, unsigned copy left on bedside table   Orson AloeMegan P Seraphine [name] 10/05/2017, 1:36 PM

## 2017-10-05 NOTE — Progress Notes (Addendum)
Subjective: Remains confused and mumbling. States ouch with any touching of extremities.   Exam: Vitals:   10/05/17 0401 10/05/17 0728  BP: 104/69 107/60  Pulse: 81 82  Resp: 20 20  Temp: 97.9 F (36.6 C) (!) 97.5 F (36.4 C)  SpO2: 100% 100%    Physical Exam   HEENT-  Normocephalic, no lesions, without obvious abnormality.  Normal external eye and conjunctiva.   Extremities- deformities both feet Musculoskeletal-RUE swelling and BLE swelling noted. No pitting edema noted. Skin-warm and excessively dry     Neuro:  Mental Status: Follows no commands. Follows me in the room. Blinks to threat. States ouch to any touch.  Cranial Nerves: II: blinks to threat III,IV, VI:, extra-ocular motions intact bilaterally pupils equal, round, reactive to light and accommodation V,VII: face symmetric, facial light touch sensation normal bilaterally VIII: hearing normal bilaterally IX,X: uvula rises symmetrically XI: bilateral shoulder shrug XII: midline tongue extension Motor: Right :  Upper extremity   4/5                                      Left:     Upper extremity   4/5             Lower extremity   3/5                                                  Lower extremity   3/5  Sensory: pain with all touch of any extremity Deep Tendon Reflexes:  symmetric throughout did not test AJ due to comfort boots Plantars: Right: downgoing   Left: downgoing     Medications:  Scheduled: .  stroke: mapping our early stages of recovery book   Does not apply Once  . Chlorhexidine Gluconate Cloth  6 each Topical Q0600  . lactulose  30 g Oral BID  . levothyroxine  50 mcg Oral QAC breakfast  . midodrine  5 mg Oral TID WC  . sodium chloride flush  3 mL Intravenous Q12H  . sodium chloride flush  3 mL Intravenous Q12H  . thiamine  100 mg Oral Daily    Pertinent Labs/Diagnostics: Carotid Doppler shows bilateral 1 to 39% stenosis Echocardiogram pending Analysis pending  Mr Maxine Glenn Neck Wo  Contrast  Result Date: 10/03/2017 CLINICAL DATA:  Initial evaluation for acute altered mental status. EXAM: MRI HEAD WITHOUT CONTRAST MRA NECK WITHOUT CONTRAST TECHNIQUE: Multiplanar, multiecho pulse sequences of the brain and surrounding structures were obtained without intravenous contrast. COMPARISON:  Prior CT from 10/02/2017 FINDINGS: MRI BRAIN FINDINGS Brain: Study severely degraded by motion artifact, limiting evaluation. Generalized age-related cerebral atrophy. Chronic microvascular ischemic changes grossly stable. Encephalomalacia within the right parietal lobe consistent with remote right posterior MCA territory infarct. This corresponds with abnormality seen on prior CT. No definite abnormal foci of restricted diffusion to suggest acute or subacute ischemia. Gray-white matter differentiation otherwise grossly maintained. Additional small remote right cerebellar infarct noted. No obvious intracranial hemorrhage. No appreciable mass lesion. No mass effect or midline shift. Diffuse ventricular prominence similar to previous examinations. Appreciable extra-axial fluid collection. Vascular: Major intracranial vascular flow voids grossly maintained at the skull base. Skull and upper cervical spine: Craniocervical junction poorly evaluated on this motion degraded exam. Bone marrow signal intensity grossly  within normal limits. No obvious focal osseous lesion. No scalp soft tissue abnormality. Sinuses/Orbits: Globes and orbital soft tissues demonstrate no definite acute abnormality. Patient status post lens extraction bilaterally. Paranasal sinuses are grossly clear. Left mastoid effusion noted. Other: None. MRA NECK FINDINGS Study severely limited by extensive motion artifact and lack of IV contrast. Aortic arch and origin of the great vessels not assessed on this exam. Common and internal carotid arteries grossly patent within the neck. Although note definite high-grade stenosis identified, evaluation fairly  limited visualized vertebral arteries grossly patent with antegrade flow. Evaluation for possible vertebral artery stenosis fairly limited. IMPRESSION: MRI HEAD IMPRESSION 1. Severely limited exam due to extensive motion artifact. 2. No definite acute intracranial abnormality. Previously noted right parietal abnormality is consistent with a chronic right posterior MCA infarct. 3. Grossly stable atrophy with chronic small vessel ischemic. MRA NECK IMPRESSION 1. Severely limited and nearly nondiagnostic examination to extensive motion artifact and lack of IV contrast. 2. Major arterial vasculature of the neck grossly patent without occlusion. Evaluation for discrete stenosis limited on this exam. Electronically Signed   By: Rise Mu M.D.   On: 10/03/2017 22:09   Mr Brain Wo Contrast  Result Date: 10/03/2017 CLINICAL DATA:  Initial evaluation for acute altered mental status. EXAM: MRI HEAD WITHOUT CONTRAST MRA NECK WITHOUT CONTRAST TECHNIQUE: Multiplanar, multiecho pulse sequences of the brain and surrounding structures were obtained without intravenous contrast. COMPARISON:  Prior CT from 10/02/2017 FINDINGS: MRI BRAIN FINDINGS Brain: Study severely degraded by motion artifact, limiting evaluation. Generalized age-related cerebral atrophy. Chronic microvascular ischemic changes grossly stable. Encephalomalacia within the right parietal lobe consistent with remote right posterior MCA territory infarct. This corresponds with abnormality seen on prior CT. No definite abnormal foci of restricted diffusion to suggest acute or subacute ischemia. Gray-white matter differentiation otherwise grossly maintained. Additional small remote right cerebellar infarct noted. No obvious intracranial hemorrhage. No appreciable mass lesion. No mass effect or midline shift. Diffuse ventricular prominence similar to previous examinations. Appreciable extra-axial fluid collection. Vascular: Major intracranial vascular flow  voids grossly maintained at the skull base. Skull and upper cervical spine: Craniocervical junction poorly evaluated on this motion degraded exam. Bone marrow signal intensity grossly within normal limits. No obvious focal osseous lesion. No scalp soft tissue abnormality. Sinuses/Orbits: Globes and orbital soft tissues demonstrate no definite acute abnormality. Patient status post lens extraction bilaterally. Paranasal sinuses are grossly clear. Left mastoid effusion noted. Other: None. MRA NECK FINDINGS Study severely limited by extensive motion artifact and lack of IV contrast. Aortic arch and origin of the great vessels not assessed on this exam. Common and internal carotid arteries grossly patent within the neck. Although note definite high-grade stenosis identified, evaluation fairly limited visualized vertebral arteries grossly patent with antegrade flow. Evaluation for possible vertebral artery stenosis fairly limited. IMPRESSION: MRI HEAD IMPRESSION 1. Severely limited exam due to extensive motion artifact. 2. No definite acute intracranial abnormality. Previously noted right parietal abnormality is consistent with a chronic right posterior MCA infarct. 3. Grossly stable atrophy with chronic small vessel ischemic. MRA NECK IMPRESSION 1. Severely limited and nearly nondiagnostic examination to extensive motion artifact and lack of IV contrast. 2. Major arterial vasculature of the neck grossly patent without occlusion. Evaluation for discrete stenosis limited on this exam. Electronically Signed   By: Rise Mu M.D.   On: 10/03/2017 22:09   Felicie Morn PA-C [facility] [phone]   Assessment:  Multifactorial encephalopathy   Recommendations: Continue thiamine Echo--pending Carotid doppler done and  1-39% Management of hypothyroidism by primary Continue dialysis  Continue ASA and statin for secondary stroke prophylaxis     10/05/2017, 10:53 AM     NEUROHOSPITALIST  ADDENDUM Seen and examined the patient today. I have reviewed the contents of history and physical exam as documented by PA/ARNP/Resident and agree with above documentation.  I have discussed and formulated the above plan as documented. Edits to the note have been made as needed.    Georgiana SpinnerSushanth Zakirah Weingart MD [facility] [phone]   If 7pm to 7am, please call on call as listed on AMION.

## 2017-10-05 NOTE — Progress Notes (Signed)
  Speech Language Pathology Treatment: Dysphagia  Patient Details Name: Samantha [name] MRN: [id number] DOB: May 31, 1945 Today's Date: 10/05/2017 Time: 1206-1222 SLP Time Calculation (min) (ACUTE ONLY): 16 min  Assessment / Plan / Recommendation Clinical Impression  Difficulty communicating with pt as she either could not process auditory information or difficulty hearing. Pt repeative in her statements with decreased intelligibility and began to state her foot hurt at end of session (notified RN). No appropriate responses to SLP's instructions or comments. Assisted with part of lunch without s/s aspiration, more intermittent refusal. Pt will continue to need full assist with meals and encouragement with puree texture and thin liquid. No further ST needed.    HPI HPI: 72 y.o. BF PMHx Dementia, ESRD on HD M/W/F  Hypothyroidism, pancreatitis, hepatitis B, diabetes type II uncontrolled with complication, GI bleed, nephrolithiasis, HTN,recent fall, and decline in her physical and cognitive status over the past 2 weeks. Patient lives with her grandson who reports that she fell approximately 2 weeks ago, was evaluated in the emergency department with negative radiographs at that time, but has been bedbound since then, complaining of hip and pelvic pain.  Patient has been eating and drinking very little, essentially only taking Ensure for the past couple weeks and has been bedbound over this interval. CT reveals a new hypodensity in the right parietal lobe suspicious for recent nonhemorrhagic infarct. Pt seen in 2018 after hip fx, required pureed foods, thin liquids, was dysphonic at the time.       SLP Plan  All goals met       Recommendations  Diet recommendations: Dysphagia 1 (puree);Thin liquid Liquids provided via: Cup;Straw Medication Administration: Crushed with puree Supervision: Staff to assist with self feeding;Full supervision/cueing for compensatory strategies Compensations: Slow  rate;Small sips/bites Postural Changes and/or Swallow Maneuvers: Seated upright 90 degrees                Oral Care Recommendations: Oral care BID Follow up Recommendations: Skilled Nursing facility SLP Visit Diagnosis: Dysphagia, unspecified (R13.10) Plan: All goals met       GO                Houston Siren 10/05/2017, 3:13 PM   Orbie Pyo Atwell Mcdanel M.Ed [employer] [phone]

## 2017-10-05 NOTE — Progress Notes (Signed)
Samantha [name] Progress Note   72 y.o. female PMH significant for dementia, hypothyroidism, ESRD on hemodialysis presented to [facility] for A[facility]. She was brought to the ER with a possible CVA - this was not the finding on MRI and this was thought to be a chronic process        Subjective:  Seen in room. Will open eyes and moan/grunt, still not using sentences. RUE edematous.  She is a little more awake this morning although face appears puffy   Objective Vitals:   10/04/17 1950 10/04/17 2329 10/05/17 0401 10/05/17 0728  BP: (!) 99/59 97/81 104/69 107/60  Pulse: 92 95 81 82  Resp: 20 20 20 20   Temp: (!) 97.5 F (36.4 C) 97.7 F (36.5 C) 97.9 F (36.6 C) (!) 97.5 F (36.4 C)  TempSrc: Oral Axillary Axillary Axillary  SpO2: 100% 100% 100% 100%  Weight:   138 lb 7.2 oz (62.8 kg)   Height:       Physical Exam General: Ill appearing female, NAD Heart: RRR, 2/6 SEM Lungs: Roughly clear anteriorly, will not take deep breaths for exam. Abdomen: soft, moans with palpation Extremities: 2+ LE edema, + flank edema and in RUE Dialysis Access: [facility] in R chest  Additional Objective Labs: Basic Metabolic Panel: Recent Labs  Lab 10/02/17 2032 10/03/17 0617 10/03/17 1218 10/03/17 2020 10/04/17 1320  NA 139 138  --   --  134*  K 5.3* 4.8 4.8 4.7 3.8  CL 99* 98*  --   --  95*  CO2 24 26  --   --  30  GLUCOSE 84 122*  --   --  98  BUN 52* 55*  --   --  20  CREATININE 7.67* 7.96*  --   --  4.34*  CALCIUM 9.1 9.4  --   --  8.2*  PHOS  --   --   --   --  2.4*   Liver Function Tests: Recent Labs  Lab 10/02/17 2032 10/03/17 0617  AST 53* 42*  ALT 27 24  ALKPHOS 118 110  BILITOT 1.8* 2.1*  PROT 8.0 7.5  ALBUMIN 2.0* 1.9*   CBC: Recent Labs  Lab 10/02/17 2032 10/02/17 2223 10/03/17 1218 10/04/17 1320  WBC 7.2  --  6.0 5.3  NEUTROABS 5.4  --  4.3  --   HGB 10.1*  --  8.6* 7.7*  HCT 34.6* 28.8* 28.3* 25.4*  MCV 107.8*  --  104.4* 103.7*  PLT 123*  --  127* 107*    Studies/Results: Mr Maxine GlennMra Neck Wo Contrast  Result Date: 10/03/2017 CLINICAL DATA:  Initial evaluation for acute altered mental status. EXAM: MRI HEAD WITHOUT CONTRAST MRA NECK WITHOUT CONTRAST TECHNIQUE: Multiplanar, multiecho pulse sequences of the brain and surrounding structures were obtained without intravenous contrast. COMPARISON:  Prior CT from 10/02/2017 FINDINGS: MRI BRAIN FINDINGS Brain: Study severely degraded by motion artifact, limiting evaluation. Generalized age-related cerebral atrophy. Chronic microvascular ischemic changes grossly stable. Encephalomalacia within the right parietal lobe consistent with remote right posterior MCA territory infarct. This corresponds with abnormality seen on prior CT. No definite abnormal foci of restricted diffusion to suggest acute or subacute ischemia. Gray-white matter differentiation otherwise grossly maintained. Additional small remote right cerebellar infarct noted. No obvious intracranial hemorrhage. No appreciable mass lesion. No mass effect or midline shift. Diffuse ventricular prominence similar to previous examinations. Appreciable extra-axial fluid collection. Vascular: Major intracranial vascular flow voids grossly maintained at the skull base. Skull and upper cervical spine:  Craniocervical junction poorly evaluated on this motion degraded exam. Bone marrow signal intensity grossly within normal limits. No obvious focal osseous lesion. No scalp soft tissue abnormality. Sinuses/Orbits: Globes and orbital soft tissues demonstrate no definite acute abnormality. Patient status post lens extraction bilaterally. Paranasal sinuses are grossly clear. Left mastoid effusion noted. Other: None. MRA NECK FINDINGS Study severely limited by extensive motion artifact and lack of IV contrast. Aortic arch and origin of the great vessels not assessed on this exam. Common and internal carotid arteries grossly patent within the neck. Although note definite high-grade  stenosis identified, evaluation fairly limited visualized vertebral arteries grossly patent with antegrade flow. Evaluation for possible vertebral artery stenosis fairly limited. IMPRESSION: MRI HEAD IMPRESSION 1. Severely limited exam due to extensive motion artifact. 2. No definite acute intracranial abnormality. Previously noted right parietal abnormality is consistent with a chronic right posterior MCA infarct. 3. Grossly stable atrophy with chronic small vessel ischemic. MRA NECK IMPRESSION 1. Severely limited and nearly nondiagnostic examination to extensive motion artifact and lack of IV contrast. 2. Major arterial vasculature of the neck grossly patent without occlusion. Evaluation for discrete stenosis limited on this exam. Electronically Signed   By: Rise Mu M.D.   On: 10/03/2017 22:09   Mr Brain Wo Contrast  Result Date: 10/03/2017 CLINICAL DATA:  Initial evaluation for acute altered mental status. EXAM: MRI HEAD WITHOUT CONTRAST MRA NECK WITHOUT CONTRAST TECHNIQUE: Multiplanar, multiecho pulse sequences of the brain and surrounding structures were obtained without intravenous contrast. COMPARISON:  Prior CT from 10/02/2017 FINDINGS: MRI BRAIN FINDINGS Brain: Study severely degraded by motion artifact, limiting evaluation. Generalized age-related cerebral atrophy. Chronic microvascular ischemic changes grossly stable. Encephalomalacia within the right parietal lobe consistent with remote right posterior MCA territory infarct. This corresponds with abnormality seen on prior CT. No definite abnormal foci of restricted diffusion to suggest acute or subacute ischemia. Gray-white matter differentiation otherwise grossly maintained. Additional small remote right cerebellar infarct noted. No obvious intracranial hemorrhage. No appreciable mass lesion. No mass effect or midline shift. Diffuse ventricular prominence similar to previous examinations. Appreciable extra-axial fluid collection.  Vascular: Major intracranial vascular flow voids grossly maintained at the skull base. Skull and upper cervical spine: Craniocervical junction poorly evaluated on this motion degraded exam. Bone marrow signal intensity grossly within normal limits. No obvious focal osseous lesion. No scalp soft tissue abnormality. Sinuses/Orbits: Globes and orbital soft tissues demonstrate no definite acute abnormality. Patient status post lens extraction bilaterally. Paranasal sinuses are grossly clear. Left mastoid effusion noted. Other: None. MRA NECK FINDINGS Study severely limited by extensive motion artifact and lack of IV contrast. Aortic arch and origin of the great vessels not assessed on this exam. Common and internal carotid arteries grossly patent within the neck. Although note definite high-grade stenosis identified, evaluation fairly limited visualized vertebral arteries grossly patent with antegrade flow. Evaluation for possible vertebral artery stenosis fairly limited. IMPRESSION: MRI HEAD IMPRESSION 1. Severely limited exam due to extensive motion artifact. 2. No definite acute intracranial abnormality. Previously noted right parietal abnormality is consistent with a chronic right posterior MCA infarct. 3. Grossly stable atrophy with chronic small vessel ischemic. MRA NECK IMPRESSION 1. Severely limited and nearly nondiagnostic examination to extensive motion artifact and lack of IV contrast. 2. Major arterial vasculature of the neck grossly patent without occlusion. Evaluation for discrete stenosis limited on this exam. Electronically Signed   By: Rise Mu M.D.   On: 10/03/2017 22:09   Medications: . sodium chloride     .  stroke: mapping our early stages of recovery book   Does not apply Once  . Chlorhexidine Gluconate Cloth  6 each Topical Q0600  . lactulose  30 g Oral BID  . levothyroxine  50 mcg Oral QAC breakfast  . midodrine  5 mg Oral TID WC  . sodium chloride flush  3 mL Intravenous  Q12H  . sodium chloride flush  3 mL Intravenous Q12H  . thiamine  100 mg Oral Daily    Dialysis Orders: MWF at [facility] 4hr, 400/800, 3K/2.25Ca, EDW 59.5kg, UF #2/linear Na, TDC, no heparin. - Mircera IV q 2 weeks (last given 09/24/17) - No VDRA/venofer.  Assessment/Plan: 1.  AMS: Ongoing for past 2 weeks per note, worsening. Reported as acute on CT, on MRI looks more chronic in nature.  Neurology less inclined to believe this is an acute cva  She is being evaluated with Carotid Doppler, Echo and thiamine 100mg  daily other causes would include, infection, hepatic encephalopathy, etc.possibly due to underlying dementia elevated TSH and electrolyte imbalance  2.  ESRD: Missed HD on 6/17, s/p HD 6/19. Essentially no UF d/t hypotension. She will have dialysis 6/21 3.  Hypertension/volume: BP low, + visible edema on exam. Has been losing actually body weight rapidly over the past few weeks - EDW has been dropped ~ 6kg within the past weeks and + edema today indicating that it needs to be dropped further. UF as tolerated. 4.  Anemia of CKD, + FOBT: Hgb 10.1 -> 8.6, next due for ESA dosing on 6/24. + FOBT. Follow closely and transfuse prn. 5.  Metabolic bone disease: Ca ok, no binders for now. Follow. 6.  Nutrition:  Alb 1.9, very low in setting of not eating and acute illness. 7.  Hx alcoholic cirrhosis: Ammonia ^, continue lactulose. 8.  Hypothyroidism: TSH high, per primary. 9.  Type 2 DM: Hypoglycemic at times, per primary. 10.  Dispo: Chronic medical decline and now with acute illness she is continuing to loose ground particularly in the setting of dementia and her acute illness. She is scheduled for dialysis 6/21, I would like to see palliative medicine involved as she is no longer being able to be cared for at home       Elvis Coil MD [name] Pager: [phone]

## 2017-10-05 NOTE — Care Management Note (Signed)
Case Management Note  Patient Details  Name: Samantha [name] MRN: [id number] Date of Birth: Jul 11, 1945  Subjective/Objective:     Pt admitted with hyperkalemia. She is from home with her grandson.                Action/Plan: Recommendations are for SNF. CM following for d/c disposition.   Expected Discharge Date:                  Expected Discharge Plan:  Skilled Nursing Facility  In-House Referral:  Clinical Social Work  Discharge planning Services     Post Acute Care Choice:    Choice offered to:     DME Arranged:    DME Agency:     HH Arranged:    HH Agency:     Status of Service:  In process, will continue to follow  If discussed at Long Length of Stay Meetings, dates discussed:    Additional Comments:  Kermit BaloKelli F Quintez Maselli, RN 10/05/2017, 11:48 AM

## 2017-10-05 NOTE — Progress Notes (Signed)
[place] TEAM 1 - Stepdown/ICU TEAM  Samantha [name] M Rickenbach  NWG:[id number] DOB: 1946/01/17 DOA: 10/02/2017 PCP: Fleet ContrasAvbuere, Edwin, MD    Brief Narrative:  72 y.o. F w/ a Hx Dementia, ESRD on HD M/W/F  Hypothyroidism, pancreatitis, hepatitis B, DM2, GI bleed, nephrolithiasis, HTN, recent fall,and decline in her physical and cognitive status over 2 weeks who presented with altered mental status after being found on the ground at home. Patient lives with her grandson who reported that she fell 2 weeks prior, was evaluated in the ED with negative radiographs, but has been bedbound since then, complaining of hip and pelvic pain. Patient was found on the floor next to her bed. She missed a dialysis session due to altered mental status and lethargy.  Significant Events: 6/18 admit  Subjective: Pt is seen while on HD.  She is lethargic and unable to provide a reliable hx. There is no apparent resp distress or uncontrolled pain.    Assessment & Plan:  Acute Encephalopathy Found down at home - likely multifactorial - w/u underway   RIGHT parietal lobe CVA Neurology following   Elevated ammonia Of unclear significance - stop lactulose and follow   Hypotensive Cont midodrine and follow   ESRD- HD M/W/F  Nephrology is folowing  Hyperkalemia Due to missed HD - corrected w/ HD  Hypophosphatemia   Hypothyroidism  TSH elevated at 24, but FT4 normal - check FT3  Occult blood positive In setting of diarrhea - follow for now - does not appear to be a signif bleed  Hip pain/gait difficulty reportedly bedbound since fall 2 weeks prior complaining of hip pain - DG hips bilateral negative fracture - likely soft tissue injury  Hypoglycemia  Resolved  SIRS Resolved - no evidence of acute infection   DVT prophylaxis: SQ heparin  Code Status: FULL CODE Family Communication: no family present at time of exam  Disposition Plan: SDU  Consultants:  PCCM Neurology  Nephrology    Antimicrobials:  none  Objective: Blood pressure (!) 115/97, pulse 84, temperature 98.2 F (36.8 C), temperature source Oral, resp. rate 17, height 5\' 4"  (1.626 m), weight 62.8 kg (138 lb 7.2 oz), SpO2 100 %.  Intake/Output Summary (Last 24 hours) at 10/05/2017 1833 Last data filed at 10/05/2017 0845 Gross per 24 hour  Intake 562.92 ml  Output -  Net 562.92 ml   Filed Weights   10/05/17 0401  Weight: 62.8 kg (138 lb 7.2 oz)    Examination: General: No acute respiratory distress Lungs: Clear to auscultation bilaterally without wheezes or crackles Cardiovascular: Regular rate and rhythm without murmur gallop or rub normal S1 and S2 Abdomen: Nontender, nondistended, soft, bowel sounds positive, no rebound, no ascites, no appreciable mass Extremities: No significant cyanosis, clubbing, or edema bilateral lower extremities  CBC: Recent Labs  Lab 10/02/17 2032  10/03/17 1218 10/04/17 1320 10/05/17 0900  WBC 7.2  --  6.0 5.3 6.1  NEUTROABS 5.4  --  4.3  --   --   HGB 10.1*  --  8.6* 7.7* 8.2*  HCT 34.6*   < > 28.3* 25.4* 27.2*  MCV 107.8*  --  104.4* 103.7* 105.4*  PLT 123*  --  127* 107* 142*   < > = values in this interval not displayed.   Basic Metabolic Panel: Recent Labs  Lab 10/03/17 0617  10/03/17 2020 10/04/17 1320 10/05/17 1242  NA 138  --   --  134* 134*  K 4.8   < > 4.7 3.8 3.6  CL 98*  --   --  95* 95*  CO2 26  --   --  30 28  GLUCOSE 122*  --   --  98 112*  BUN 55*  --   --  20 24*  CREATININE 7.96*  --   --  4.34* 5.52*  CALCIUM 9.4  --   --  8.2* 8.6*  MG  --   --   --  2.0 2.3  PHOS  --   --   --  2.4* 3.0   < > = values in this interval not displayed.   GFR: Estimated Creatinine Clearance: 7.8 mL/min (A) (by C-G formula based on SCr of 5.52 mg/dL (H)).  Liver Function Tests: Recent Labs  Lab 10/02/17 2032 10/03/17 0617 10/05/17 1242  AST 53* 42*  --   ALT 27 24  --   ALKPHOS 118 110  --   BILITOT 1.8* 2.1*  --   PROT 8.0 7.5  --    ALBUMIN 2.0* 1.9* 1.7*    Recent Labs  Lab 10/02/17 2032  AMMONIA 59*   HbA1C: Hgb A1c MFr Bld  Date/Time Value Ref Range Status  10/03/2017 12:18 PM 4.0 (L) 4.8 - 5.6 % Final    Comment:    (NOTE) Pre diabetes:          5.7%-6.4% Diabetes:              >6.4% Glycemic control for   <7.0% adults with diabetes   02/16/2017 08:56 AM 3.9 (L) 4.8 - 5.6 % Final    Comment:    (NOTE) Pre diabetes:          5.7%-6.4% Diabetes:              >6.4% Glycemic control for   <7.0% adults with diabetes     CBG: Recent Labs  Lab 10/04/17 2103 10/04/17 2327 10/05/17 0411 10/05/17 0736 10/05/17 1142  GLUCAP 125* 113* 87 89 108*    Recent Results (from the past 240 hour(s))  Blood Culture (routine x 2)     Status: None (Preliminary result)   Collection Time: 10/03/17  8:20 AM  Result Value Ref Range Status   Specimen Description BLOOD RIGHT HAND  Final   Special Requests   Final    BOTTLES DRAWN AEROBIC AND ANAEROBIC Blood Culture adequate volume   Culture   Final    NO GROWTH 2 DAYS Performed at [facility], [address] [place], [place] [address]    Report Status PENDING  Incomplete     Scheduled Meds: .  stroke: mapping our early stages of recovery book   Does not apply Once  . Chlorhexidine Gluconate Cloth  6 each Topical Q0600  . levothyroxine  50 mcg Oral QAC breakfast  . midodrine  5 mg Oral TID WC  . sodium chloride flush  3 mL Intravenous Q12H  . sodium chloride flush  3 mL Intravenous Q12H  . thiamine  100 mg Oral Daily     LOS: 3 days   Lonia Blood, MD [facility] Office  [phone] Pager - Text Page per Loretha Stapler as per below:  On-Call/Text Page:      [email or web address]      password [email or web address]  If 7PM-7AM, please contact night-coverage [email or web address] Password [facility] 10/05/2017, 6:33 PM

## 2017-10-06 DIAGNOSIS — W19XXXS Unspecified fall, sequela: Secondary | ICD-10-CM

## 2017-10-06 DIAGNOSIS — I499 Cardiac arrhythmia, unspecified: Secondary | ICD-10-CM

## 2017-10-06 DIAGNOSIS — R5381 Other malaise: Secondary | ICD-10-CM

## 2017-10-06 DIAGNOSIS — I517 Cardiomegaly: Secondary | ICD-10-CM

## 2017-10-06 DIAGNOSIS — F0150 Vascular dementia without behavioral disturbance: Secondary | ICD-10-CM

## 2017-10-06 LAB — GLUCOSE, CAPILLARY
GLUCOSE-CAPILLARY: 78 mg/dL (ref 65–99)
Glucose-Capillary: 95 mg/dL (ref 65–99)
Glucose-Capillary: 109 mg/dL — ABNORMAL HIGH (ref 65–99)
Glucose-Capillary: 81 mg/dL (ref 65–99)
Glucose-Capillary: 107 mg/dL — ABNORMAL HIGH (ref 65–99)

## 2017-10-06 MED ORDER — ASPIRIN EC 81 MG PO TBEC
81.0000 mg | DELAYED_RELEASE_TABLET | Freq: Every day | ORAL | Status: DC
  Administered 2017-10-06 – 2017-10-09 (×4): 81 mg via ORAL
  Filled 2017-10-06 (×4): qty 1

## 2017-10-06 MED ORDER — ACETAMINOPHEN 500 MG PO TABS: 500.0000 mg | ORAL_TABLET | Freq: Four times a day (QID) | ORAL | Status: DC | PRN

## 2017-10-06 MED ORDER — ATORVASTATIN CALCIUM 40 MG PO TABS: 40.0000 mg | ORAL_TABLET | Freq: Every day | ORAL | Status: DC

## 2017-10-06 MED ORDER — ALBUMIN HUMAN 25 % IV SOLN: 25.0000 g | Freq: Once | INTRAVENOUS | Status: DC

## 2017-10-06 MED ORDER — FENTANYL CITRATE (PF) 100 MCG/2ML IJ SOLN
12.5000 ug | INTRAMUSCULAR | Status: DC | PRN
  Administered 2017-10-08 – 2017-10-09 (×2): 12.5 ug via INTRAVENOUS
  Filled 2017-10-06 (×2): qty 2

## 2017-10-06 MED ORDER — LACTULOSE 10 GM/15ML PO SOLN
20.0000 g | Freq: Two times a day (BID) | ORAL | Status: DC
  Administered 2017-10-06 (×2): 20 g via ORAL
  Filled 2017-10-06 (×2): qty 30

## 2017-10-06 NOTE — Progress Notes (Addendum)
[place] TEAM 1 - Stepdown/ICU TEAM  KYANDRA MCCLAINE  ONG:[id number] DOB: January 07, 1946 DOA: 10/02/2017 PCP: Fleet Contras, MD    Brief Narrative:  72yo F w/ a Hx Dementia, ESRD on HD M/W/F, Hypothyroidism, pancreatitis, hepatitis B, DM2, GI bleed, nephrolithiasis, HTN, recent fall,and decline in her physical and cognitive status over 2 weeks who presented with altered mental status after being found on the floor at home. Patient lives with her grandson who reported that she fell 2 weeks prior, was evaluated in the ED with negative radiographs, but has been bedbound since then, complaining of hip and pelvic pain. Patient was found on the floor next to her bed. She missed a dialysis session due to altered mental status and lethargy.  Significant Events: 6/18 admit  Subjective: Much more alter today, but confused.  Does not know where she is or why she is here.  Is not able to provide a ROS.  No family present.  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 - Elevated ammonia Ammonia 59 10/02/17 - resume lactulose and recheck ammonia level w/ next HD  Dementia  Baseline mental status not clear   Hypotensive Cont midodrine and follow - BP stable presently   ESRD- HD M/W/F  Nephrology is folowing  Hyperkalemia Due to missed HD - corrected w/ HD  Hypophosphatemia  Recheck w/ next HD   Hypothyroidism  TSH elevated at 24, but FT4 normal - check FT3 when able to obtain blood   Occult blood positive In setting of diarrhea -  follow for now - does not appear to be a signif bleed  Hip pain/gait difficulty reportedly bedbound since fall 2 weeks prior complaining of hip pain - DG hips bilateral negative fracture - likely soft tissue injury  DM - Hypoglycemia  Resolved - A1c quite low at 4.0 - follow CBG  SIRS Resolved - no evidence of acute infection   DVT prophylaxis: SQ heparin  Code Status: DNR - NO CODE Family Communication: no family present at time of exam  Disposition Plan: transfer to tele - cont w/u of delirium - perhaps it is time to consider stopping HD - will ask PC to see as suggested by Nephrology if mental status does not improve w/ normalization of ammonia   Consultants:  PCCM Neurology  Nephrology   Antimicrobials:  Zosyn 6/18 > 6/19 Vanc 6/18 > 6/19  Objective: Blood pressure (!) 103/54, pulse (!) 101, temperature 98.1 F (36.7 C), temperature source Axillary, resp. rate 18, height 5\' 4"  (1.626 m), weight 54.9 kg (121 lb 0.5 oz), SpO2 100 %.  Intake/Output Summary (Last 24 hours) at 10/06/2017 1113 Last data filed at 10/05/2017 1737 Gross per 24 hour  Intake -  Output 2000 ml  Net -2000 ml   Filed Weights   10/05/17 0401 10/05/17 1737 10/06/17 0500  Weight: 62.8 kg (138 lb 7.2 oz) 60.8 kg (134 lb 0.6 oz) 54.9 kg (121 lb 0.5 oz)    Examination: General: No acute  respiratory distress evident  Lungs: Clear to auscultation B - no wheezing  Cardiovascular: RRR - no M or rub  Abdomen: NT/ND, soft, bs+, no mass Extremities: 1+ edema B LE   CBC: Recent Labs  Lab 10/02/17 2032  10/03/17 1218 10/04/17 1320 10/05/17 0900  WBC 7.2  --  6.0 5.3 6.1  NEUTROABS 5.4  --  4.3  --   --   HGB 10.1*  --  8.6* 7.7* 8.2*  HCT 34.6*   < > 28.3* 25.4* 27.2*  MCV 107.8*  --  104.4* 103.7* 105.4*  PLT 123*  --  127* 107* 142*   < > = values in this interval not displayed.   Basic Metabolic Panel: Recent Labs  Lab 10/03/17 0617  10/03/17 2020 10/04/17 1320 10/05/17 1242  NA 138   --   --  134* 134*  K 4.8   < > 4.7 3.8 3.6  CL 98*  --   --  95* 95*  CO2 26  --   --  30 28  GLUCOSE 122*  --   --  98 112*  BUN 55*  --   --  20 24*  CREATININE 7.96*  --   --  4.34* 5.52*  CALCIUM 9.4  --   --  8.2* 8.6*  MG  --   --   --  2.0 2.3  PHOS  --   --   --  2.4* 3.0   < > = values in this interval not displayed.   GFR: Estimated Creatinine Clearance: 7.8 mL/min (A) (by C-G formula based on SCr of 5.52 mg/dL (H)).  Liver Function Tests: Recent Labs  Lab 10/02/17 2032 10/03/17 0617 10/05/17 1242  AST 53* 42*  --   ALT 27 24  --   ALKPHOS 118 110  --   BILITOT 1.8* 2.1*  --   PROT 8.0 7.5  --   ALBUMIN 2.0* 1.9* 1.7*    Recent Labs  Lab 10/02/17 2032  AMMONIA 59*   HbA1C: Hgb A1c MFr Bld  Date/Time Value Ref Range Status  10/03/2017 12:18 PM 4.0 (L) 4.8 - 5.6 % Final    Comment:    (NOTE) Pre diabetes:          5.7%-6.4% Diabetes:              >6.4% Glycemic control for   <7.0% adults with diabetes   02/16/2017 08:56 AM 3.9 (L) 4.8 - 5.6 % Final    Comment:    (NOTE) Pre diabetes:          5.7%-6.4% Diabetes:              >6.4% Glycemic control for   <7.0% adults with diabetes     CBG: Recent Labs  Lab 10/05/17 2000 10/05/17 2003 10/05/17 2154 10/06/17 0353 10/06/17 0721  GLUCAP 59* 65 88 78 95    Recent Results (from the past 240 hour(s))  Blood Culture (routine x 2)     Status: None (Preliminary result)   Collection Time: 10/03/17  8:20 AM  Result Value Ref Range Status   Specimen Description BLOOD RIGHT HAND  Final   Special Requests   Final    BOTTLES DRAWN AEROBIC AND ANAEROBIC Blood Culture adequate volume   Culture   Final    NO GROWTH 2 DAYS Performed at [facility], [address], [place], [place] [address]    Report Status PENDING  Incomplete  Scheduled Meds: . Chlorhexidine Gluconate Cloth  6 each Topical Q0600  . levothyroxine  50 mcg Oral QAC breakfast  . midodrine  5 mg Oral TID WC  . sodium  chloride flush  3 mL Intravenous Q12H  . thiamine  100 mg Oral Daily     LOS: 4 days   Lonia BloodJeffrey T. Pritesh Sobecki, MD [facility] Office  [phone] Pager - Text Page per Loretha StaplerAmion as per below:  On-Call/Text Page:      [email or web address]      password [email or web address]  If 7PM-7AM, please contact night-coverage [email or web address] Password [email or web address] 10/06/2017, 11:13 AM

## 2017-10-06 NOTE — Progress Notes (Signed)
Neurology Progress Note CC: none per pt; f/u AMS HPI: Samantha [name] is an 72 y.o. female with baseline dementia, hypothyroidism, ESRD on hemodialysis. She had a recent fall and ~2wk physical and cognitive decline and additionally missed HD, which acutely worsened pt condition. CT of head done 10/02/17  New area of hypodensity in the right parietal lobe suspicious for a recent/subacute  nonhemorrhagic infarct. MRI and MRA of neck  (test was limited due to motion) No definite acute intracranial abnormality. Previously noted right parietal abnormality is consistent with a chronic right posterior MCA infarct  Subjective: Much improved today, tells me her grandson's name. pleasantly confused. Per grandson is is nearing baseline dementia.  Exam: Vitals:   10/06/17 0259 10/06/17 0817  BP: 133/76 (!) 103/54  Pulse: 97 (!) 101  Resp: 20 18  Temp: 98.1 F (36.7 C)   SpO2: 97% 100%    Physical Exam  Gen: chronically ill appearing, frail and elderly in no distress currently HEENT-  Normocephalic, no lesions, without obvious abnormality.  Normal external eye and conjunctiva.  Extremities- deformities both feet; boots on per rehab Musculoskeletal-RUE swelling and BLE swelling noted. No pitting edema noted. Skin-warm and excessively dry   Neuro:  Mental Status:  Alert, attentive. Very HOH even with hearing aid in. She can tell me her name, her grandson's name but she is pleasantly confused to all else. Per grandson this is near her baseline dementia. Follows simple commands Cranial Nerves: II: blinks to threat III,IV, VI:, extra-ocular motions intact bilaterally pupils equal, round, reactive to light and accommodation V,VII: face symmetric, facial light touch sensation normal bilaterally VIII: hearing normal bilaterally IX,X: uvula rises symmetrically XI: bilateral shoulder shrug XII: midline tongue extension Motor: lower ext difficult to test d/t special boots in place for skin  protection Right :  Upper extremity   4/5                                      Left:     Upper extremity   4/5             Lower extremity   2/5                                                  Lower extremity   2/5  Sensory: pain with all touch of any extremity Deep Tendon Reflexes:  symmetric throughout did not test AJ due to comfort boots Plantars: mute     Medications:  Scheduled: . Chlorhexidine Gluconate Cloth  6 each Topical Q0600  . levothyroxine  50 mcg Oral QAC breakfast  . midodrine  5 mg Oral TID WC  . sodium chloride flush  3 mL Intravenous Q12H  . thiamine  100 mg Oral Daily   I have reviewed in Epic the pertinent labs, imaging, including the MRI/MRA which were nearly impossible to use as a diagnostic tool d/t motion degraded    A/P: 72 yr old lady who is quite debilitated at baseline presents with 2 week decline after fall with missed HD causing further toxic-metabolic encephalopathy.   # Encephalopathy- multifactoral d/t subacute stroke, toxic-metabolic effect, underlying dementia and acute decompensation in setting of fall, immobility, hypothyroid and toxic/metabolic effect. She is now improving and nearing baseline per family  at bedside today. # Dementia w/baseline debility (mRS 4)- high risk for delirium and will most definitely prolong a toxic metabolic encephalopathy even upon correction of underlying issues post HD. She has poor neurologic reserve # ESRD on HD- contributing factor to presentation and delayed neurologic recovery # Subacute Stroke in posterior R MCA- appears to be cardioembolic etiology. Although no exact etiology has been seen I highly suspect she has Afib.   -Tele: upon review of strips, she does have irreg rythum at times w/PAC/PVCs, but not clearly Afib seen thus far.   -Carotid Doppler shows bilateral 1 to 39% stenosis  -Echocardiogram: EF 55%. Sever Left Atrium enlargement. Would consider starting anticoagulation per the ARCADIA trial, but her  HD is an exclusion factor for this. Recommend ASA only for now until clear indication is found for anticoagulation. Given her age, debility and co-morbid state, do not think a Loop is indicated, I think a 30D halter type monitor would have high yield in this case.   Recommendations: Stroke rehab evals Start Lipitor 40mg  daily Start ASA EC 81mg  daily. (Noted ASA "allergy" is actually a GI intolerance, therefore will give as EC and urge to take with food to avoid GI upset. Benefit for secondary stroke prevention outweighs this risk.) Management of hypothyroidism by primary Continue dialysis per renal team and maximize all medical mgt for optimal stroke recovery 30 day halter monitor for further evaluation of Afib upon d/c I have d/w the pt and her family at bedside, they are active in her goals of care.  Out pt f/u with Dr Pearlean BrownieSethi, [facility]   Tajae Maiolo Metzger-Cihelka, ARNP-C [facility] 10/06/2017, 10:56 AM

## 2017-10-06 NOTE — Progress Notes (Signed)
Notified Dr.  [name] that lab is unable to get AM labs. They have stuck her 3 times. [name], [name] RN

## 2017-10-06 NOTE — Progress Notes (Signed)
[facility] Progress Note   72 y.o. female PMH significant for dementia, hypothyroidism, ESRD on hemodialysis [facility]. She was brought to the ER with a possible CVA - this was not the finding on MRI and this was thought to be a chronic process        Subjective:  Seen in room. Will open eyes and moan/grunt, still not using sentences. RUE edematous.   [facility] and not making any sense to me  Objective Vitals:   10/05/17 2347 10/06/17 0259 10/06/17 0500 10/06/17 0817  BP: (!) 115/56 133/76  (!) 103/54  Pulse: 97 97  (!) 101  Resp: 18 20  18   Temp: 98 F (36.7 C) 98.1 F (36.7 C)    TempSrc: Oral Axillary    SpO2: 98% 97%  100%  Weight:   121 lb 0.5 oz (54.9 kg)   Height:       Physical Exam General: Ill appearing female, NAD Heart: RRR, 2/6 SEM Lungs: Roughly clear anteriorly, will not take deep breaths for exam. Abdomen: soft, moans with palpation Extremities: 2+ LE edema, + flank edema and in RUE [facility]  Additional Objective Labs: Basic Metabolic Panel: Recent Labs  Lab 10/03/17 0617  10/03/17 2020 10/04/17 1320 10/05/17 1242  NA 138  --   --  134* 134*  K 4.8   < > 4.7 3.8 3.6  CL 98*  --   --  95* 95*  CO2 26  --   --  30 28  GLUCOSE 122*  --   --  98 112*  BUN 55*  --   --  20 24*  CREATININE 7.96*  --   --  4.34* 5.52*  CALCIUM 9.4  --   --  8.2* 8.6*  PHOS  --   --   --  2.4* 3.0   < > = values in this interval not displayed.   Liver Function Tests: Recent Labs  Lab 10/02/17 2032 10/03/17 0617 10/05/17 1242  AST 53* 42*  --   ALT 27 24  --   ALKPHOS 118 110  --   BILITOT 1.8* 2.1*  --   PROT 8.0 7.5  --   ALBUMIN 2.0* 1.9* 1.7*   CBC: Recent Labs  Lab 10/02/17 2032  10/03/17 1218 10/04/17 1320 10/05/17 0900  WBC 7.2  --  6.0 5.3 6.1  NEUTROABS 5.4  --  4.3  --   --   HGB 10.1*  --  8.6* 7.7* 8.2*  HCT 34.6*   < > 28.3* 25.4* 27.2*  MCV 107.8*  --  104.4* 103.7*  105.4*  PLT 123*  --  127* 107* 142*   < > = values in this interval not displayed.   Studies/Results: No results found. Medications:  . Chlorhexidine Gluconate Cloth  6 each Topical Q0600  . levothyroxine  50 mcg Oral QAC breakfast  . midodrine  5 mg Oral TID WC  . sodium chloride flush  3 mL Intravenous Q12H  . thiamine  100 mg Oral Daily    [facility] 400/800, 3K/2.25Ca, EDW 59.5kg, UF #2/linear Na, TDC, no heparin. - Mircera 225mcg IV q 2 weeks (last given 09/24/17) - No VDRA/venofer.  Assessment/Plan: 1.  AMS: Ongoing for past 2 weeks per note, worsening. Reported as acute on CT, on MRI looks more chronic in nature.  Neurology less inclined to believe this is an acute cva  She is being evaluated with Carotid 1-39% stenosis on Doppler, Echo  Normal EF and thiamine 100mg  daily other causes would include, infection, hepatic encephalopathy, etc.possibly due to underlying dementia elevated TSH and electrolyte imbalance  2.  ESRD: Missed HD on 6/17, s/p HD 6/19. Essentially no UF d/t hypotension. She had dialysis 6/21 with 2 L off  3.  Hypertension/volume: BP low, + visible edema on exam. Has been losing actually body weight rapidly over the past few weeks - EDW has been dropped ~ 6kg within the past weeks and + edema today indicating that it needs to be dropped further. UF as tolerated. 4.  Anemia of CKD, + FOBT: Hgb 10.1 -> 8.6, next due for ESA dosing on 6/24. + FOBT. Follow closely and transfuse prn. 5.  Metabolic bone disease: Ca ok, no binders for now. Follow. 6.  Nutrition:  Alb 1.9, very low in setting of not eating and acute illness. 7.  Hx alcoholic cirrhosis: Ammonia ^, continue lactulose. 8.  Hypothyroidism: TSH high, per primary. 9.  Type 2 DM: Hypoglycemic at times, per primary. 10.  Dispo: Chronic medical decline and now with acute illness she is continuing to loose ground particularly in the setting of dementia and her acute illness. She had  dialysisI would like to see palliative medicine involved as she is no longer being able to be cared for at home  Social work working on placement in SNF       Elvis Coil MD [facility] Pager: [phone]

## 2017-10-07 LAB — GLUCOSE, CAPILLARY
GLUCOSE-CAPILLARY: 87 mg/dL (ref 65–99)
GLUCOSE-CAPILLARY: 110 mg/dL — AB (ref 65–99)
Glucose-Capillary: 71 mg/dL (ref 65–99)
Glucose-Capillary: 79 mg/dL (ref 65–99)
Glucose-Capillary: 100 mg/dL — ABNORMAL HIGH (ref 65–99)

## 2017-10-07 MED ORDER — LACTULOSE 10 GM/15ML PO SOLN
20.0000 g | Freq: Two times a day (BID) | ORAL | Status: DC
  Administered 2017-10-08 – 2017-10-09 (×3): 20 g via ORAL
  Filled 2017-10-07 (×3): qty 30

## 2017-10-07 MED ORDER — DARBEPOETIN ALFA 100 MCG/0.5ML IJ SOSY
100.0000 ug | PREFILLED_SYRINGE | INTRAMUSCULAR | Status: DC
  Administered 2017-10-08: 100 ug via INTRAVENOUS

## 2017-10-07 MED ORDER — PRO-STAT SUGAR FREE PO LIQD
30.0000 mL | Freq: Two times a day (BID) | ORAL | Status: DC
  Administered 2017-10-07 – 2017-10-09 (×5): 30 mL via ORAL
  Filled 2017-10-07 (×5): qty 30

## 2017-10-07 MED ORDER — CHLORHEXIDINE GLUCONATE CLOTH 2 % EX PADS
6.0000 | MEDICATED_PAD | Freq: Every day | CUTANEOUS | Status: DC
  Administered 2017-10-08 – 2017-10-10 (×2): 6 via TOPICAL

## 2017-10-07 NOTE — Progress Notes (Signed)
PROGRESS NOTE    Samantha [name]  ZOX:[id number] DOB: 1945/08/10 DOA: 10/02/2017 PCP: Samantha ContrasAvbuere, Edwin, MD      Brief Narrative:  [name] is a 72 y.o. 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  Patient lives with her grandson who reports that she fell approximately 2 weeks ago, was evaluated in the emergency department with negative radiographs at that time, but has been bedbound since then, complaining of hip and pelvic pain.  Patient has been eating and drinking very little, essentially only taking Ensure for the past couple weeks and has been bedbound over this interval.    CT head on admission showed possible stroke, but follow up MRI showed this was chronic.  Her metabolic encephalopathy has persisted, despite dialysis, with difficulty differentiating a reversible cause.       Assessment & Plan:  Acute metabolic encephalopathy Neurology consulted.  Etiology is considered multifactorial and compounded by dementia.  Hepatic encephalopathy seems to be the primary driver, but she has been slow to resolve.  B12 normal.  RPR negative.  Cortisol normal. MRI brain shows only old stroke.  TSH elevated but T4 normal.  Ammonia slightly elevated.  Per previous notes, Nephrology (who are obviously familiar with the patient) report she is usually pleasantly demented but chatty. -Continue thiamine. -Continue home levothyroxine, follow T3 -Trend ammonia -Consult palliative medicine    Right parietal lobe CVA -Neurology consulted, no further work up at this time  Cirrhosis Hepatic encephalopathy Hypotension -Repeat ammonia -Hold lactulose today due to recurrent watery diarrhea overnight -Continue midodrine  Hypothyroidism TSH high, T4 normal.   -Follow T3 -Continue levothyroxine  ESRD on HD MWF -Consult to Nephrology -Consult to  Palliative care  Hyperkalemia Corrected  Stool occult blood No clinical bleeding has been appreciated -Monitor stool -Trend Hgb  Diabetes A1c 4% -Accuchecks  Anemia of chronic renal disease Hgb no change  Hip pain This was one of her initial presenting complaints.  Plain radiograph negative.  To my exam she has no knee, leg, or hip pain.  Per PT notes, she requires max assist to sit up and maneuver in bed, they have not tried weight bearing.  I suspect that her overall deconditioning is more an obstacle to movement than to pain. -Continue PT         DVT prophylaxis: SCDs Code Status: DO NOT RESUSCITATE Family Communication: None present, will call on phone MDM and disposition Plan: The below labs and imaging reports were reviewed and summarized above.    The patient was admitted with encephalopathy.  Seems to be primarily HE, which she has had recurrently.  At this time, however, seems slow to resolve, suspect that this episode has progressed her dementia to a substantial degree.  At this time, I agree with Nephrology that it is reasonable to consult Palliative Care about decision to continue dialysis   Consultants:   Neurology  Palliative Care  Nephrology  Procedures:   None  Antimicrobials:       Subjective: Diarrhea overnight. More alert today, but unable to participate in ROS or history taking.  No fever, respiratory distress.  Encephalopathy still severe.  Objective: Vitals:   10/07/17 0000 10/07/17 0352 10/07/17 0448 10/07/17 0808  BP: 122/65 121/63  104/72  Pulse: 91 88  89  Resp: 18 18  20   Temp: 98.7 F (37.1  C) 98 F (36.7 C)  98.2 F (36.8 C)  TempSrc: Oral Oral    SpO2: 93% 94%    Weight:   61.3 kg (135 lb 2.3 oz)   Height:       No intake or output data in the 24 hours ending 10/07/17 0858 Filed Weights   10/05/17 1737 10/06/17 0500 10/07/17 0448  Weight: 60.8 kg (134 lb 0.6 oz) 54.9 kg (121 lb 0.5 oz) 61.3 kg (135 lb 2.3 oz)     Examination: General appearance: thin adult female, alert to me, but confused, slowed, no obvious distress, sitting in her own stool, unable to voice needs.   HEENT: Anicteric, conjunctiva pink, lids and lashes normal. No nasal deformity, discharge, epistaxis.  Lips and cheeks seem almost anasarcic, edentulous, OP dry, no oral lesions, hearing poor.   Skin: Warm and dry.  Bruising noted on arms. No suspicious rashes or lesions. Cardiac: RRR, nl S1-S2, no murmurs appreciated.  LE edema, in fact, anasarca noted. Respiratory: Normal respiratory rate and rhythm.  CTAB without rales or wheezes. Abdomen: Abdomen soft.  No TTP. No ascites.  No scars, hernias.   MSK: No deformities or effusions of large joints of upper or lower extremities bilaterally.  Knees no tenderness bilaterally.  Shins and femurs no point tenderness or signs of occult fracture.  Hip ROM difficult because of positioning, but doesn't appear painful.  She is just globally weak. Neuro: Awake and alert to me, but sluggish, slowed mentation, very confused.  Very dysarthric, because of dry mouth compounding baseline.  EOMI, moves upper extremities equally 4/5 strength, cannot sit up without assistance.  Psych: Unable to answer questions intelligibly, seems to be asking for help out of bed, but perseverates, even after she has been helped.       Data Reviewed: I have personally reviewed following labs and imaging studies:  CBC: Recent Labs  Lab 10/02/17 1953 10/02/17 2032 10/02/17 2223 10/03/17 1218 10/04/17 1320 10/05/17 0900  WBC  --  7.2  --  6.0 5.3 6.1  NEUTROABS  --  5.4  --  4.3  --   --   HGB 11.9* 10.1*  --  8.6* 7.7* 8.2*  HCT 35.0* 34.6* 28.8* 28.3* 25.4* 27.2*  MCV  --  107.8*  --  104.4* 103.7* 105.4*  PLT  --  123*  --  127* 107* 142*   Basic Metabolic Panel: Recent Labs  Lab 10/02/17 1953 10/02/17 2032 10/03/17 0617 10/03/17 1218 10/03/17 2020 10/04/17 1320 10/05/17 1242  NA 136 139 138  --   --   134* 134*  K 6.8* 5.3* 4.8 4.8 4.7 3.8 3.6  CL 103 99* 98*  --   --  95* 95*  CO2  --  24 26  --   --  30 28  GLUCOSE 84 84 122*  --   --  98 112*  BUN 75* 52* 55*  --   --  20 24*  CREATININE 8.00* 7.67* 7.96*  --   --  4.34* 5.52*  CALCIUM  --  9.1 9.4  --   --  8.2* 8.6*  MG  --   --   --   --   --  2.0 2.3  PHOS  --   --   --   --   --  2.4* 3.0   GFR: Estimated Creatinine Clearance: 7.8 mL/min (A) (by C-G formula based on SCr of 5.52 mg/dL (H)). Liver Function Tests: Recent Labs  Lab  10/02/17 2032 10/03/17 0617 10/05/17 1242  AST 53* 42*  --   ALT 27 24  --   ALKPHOS 118 110  --   BILITOT 1.8* 2.1*  --   PROT 8.0 7.5  --   ALBUMIN 2.0* 1.9* 1.7*   No results for input(s): LIPASE, AMYLASE in the last 168 hours. Recent Labs  Lab 10/02/17 2032  AMMONIA 59*   Coagulation Profile: No results for input(s): INR, PROTIME in the last 168 hours. Cardiac Enzymes: No results for input(s): CKTOTAL, CKMB, CKMBINDEX, TROPONINI in the last 168 hours. BNP (last 3 results) No results for input(s): PROBNP in the last 8760 hours. HbA1C: No results for input(s): HGBA1C in the last 72 hours. CBG: Recent Labs  Lab 10/06/17 1131 10/06/17 1631 10/06/17 1916 10/07/17 0301 10/07/17 0812  GLUCAP 109* 81 107* 87 71   Lipid Profile: No results for input(s): CHOL, HDL, LDLCALC, TRIG, CHOLHDL, LDLDIRECT in the last 72 hours. Thyroid Function Tests: No results for input(s): TSH, T4TOTAL, FREET4, T3FREE, THYROIDAB in the last 72 hours. Anemia Panel: No results for input(s): VITAMINB12, FOLATE, FERRITIN, TIBC, IRON, RETICCTPCT in the last 72 hours. Urine analysis:    Component Value Date/Time   COLORURINE AMBER (A) 08/03/2014 1249   APPEARANCEUR CLOUDY (A) 08/03/2014 1249   LABSPEC 1.020 08/03/2014 1249   PHURINE 7.0 08/03/2014 1249   GLUCOSEU 500 (A) 08/03/2014 1249   HGBUR LARGE (A) 08/03/2014 1249   BILIRUBINUR MODERATE (A) 08/03/2014 1249   KETONESUR 15 (A) 08/03/2014 1249    PROTEINUR >300 (A) 08/03/2014 1249   UROBILINOGEN 4.0 (H) 08/03/2014 1249   NITRITE POSITIVE (A) 08/03/2014 1249   LEUKOCYTESUR TRACE (A) 08/03/2014 1249   Sepsis Labs: @LABRCNTIP (procalcitonin:4,lacticacidven:4)  ) Recent Results (from the past 240 hour(s))  Blood Culture (routine x 2)     Status: None (Preliminary result)   Collection Time: 10/03/17  8:20 AM  Result Value Ref Range Status   Specimen Description BLOOD RIGHT HAND  Final   Special Requests   Final    BOTTLES DRAWN AEROBIC AND ANAEROBIC Blood Culture adequate volume   Culture   Final    NO GROWTH 3 DAYS Performed at [facility], [address], [place], [place] [address]    Report Status PENDING  Incomplete         Radiology Studies: No results found.      Scheduled Meds: . aspirin EC  81 mg Oral Daily  . Chlorhexidine Gluconate Cloth  6 each Topical Q0600  . [START ON 10/08/2017] lactulose  20 g Oral BID  . levothyroxine  50 mcg Oral QAC breakfast  . midodrine  5 mg Oral TID WC  . sodium chloride flush  3 mL Intravenous Q12H  . thiamine  100 mg Oral Daily   Continuous Infusions:   LOS: 5 days    Time spent: 25 minutes    Alberteen Sam, MD [facility] 10/07/2017, 8:58 AM     Pager [phone] --- please page though AMION:  [email or web address] If 7PM-7AM, please contact night-coverage

## 2017-10-07 NOTE — Clinical Social Work Note (Signed)
Pallative consulted. CSW continuing to follow for disposition plan.  Hillsboro PinesBridget Sieanna Vanstone, ConnecticutLCSWA [phone]

## 2017-10-07 NOTE — Progress Notes (Signed)
[facility] Progress Note   72 y.o. female PMH significant for dementia, hypothyroidism, ESRD on hemodialysis presented to [facility] for AMS. She was brought to the ER with a possible CVA - this was not the finding on MRI and this was thought to be a chronic process        Subjective:  Seen in room. Will open eyes and moan/grunt, still not using sentences. RUE edematous.   She continues to [facility] and is not coherent  She is eating breakfast unassisted   Objective Vitals:   10/07/17 0000 10/07/17 0352 10/07/17 0448 10/07/17 0808  BP: 122/65 121/63  104/72  Pulse: 91 88  89  Resp: 18 18  20   Temp: 98.7 F (37.1 C) 98 F (36.7 C)  98.2 F (36.8 C)  TempSrc: Oral Oral    SpO2: 93% 94%    Weight:   135 lb 2.3 oz (61.3 kg)   Height:       Physical Exam General: Ill appearing female, NAD Heart: RRR, 2/6 SEM Lungs: Roughly clear anteriorly, will not take deep breaths for exam. Abdomen: soft, moans with palpation Extremities: 2+ LE edema, + flank edema and in RUE Dialysis Access: [facility] in R chest  Additional Objective Labs: Basic Metabolic Panel: Recent Labs  Lab 10/03/17 0617  10/03/17 2020 10/04/17 1320 10/05/17 1242  NA 138  --   --  134* 134*  K 4.8   < > 4.7 3.8 3.6  CL 98*  --   --  95* 95*  CO2 26  --   --  30 28  GLUCOSE 122*  --   --  98 112*  BUN 55*  --   --  20 24*  CREATININE 7.96*  --   --  4.34* 5.52*  CALCIUM 9.4  --   --  8.2* 8.6*  PHOS  --   --   --  2.4* 3.0   < > = values in this interval not displayed.   Liver Function Tests: Recent Labs  Lab 10/02/17 2032 10/03/17 0617 10/05/17 1242  AST 53* 42*  --   ALT 27 24  --   ALKPHOS 118 110  --   BILITOT 1.8* 2.1*  --   PROT 8.0 7.5  --   ALBUMIN 2.0* 1.9* 1.7*   CBC: Recent Labs  Lab 10/02/17 2032  10/03/17 1218 10/04/17 1320 10/05/17 0900  WBC 7.2  --  6.0 5.3 6.1  NEUTROABS 5.4  --  4.3  --   --   HGB 10.1*  --  8.6* 7.7* 8.2*  HCT 34.6*   < > 28.3* 25.4* 27.2*  MCV  107.8*  --  104.4* 103.7* 105.4*  PLT 123*  --  127* 107* 142*   < > = values in this interval not displayed.   Studies/Results: No results found. Medications:  . aspirin EC  81 mg Oral Daily  . Chlorhexidine Gluconate Cloth  6 each Topical Q0600  . [START ON 10/08/2017] lactulose  20 g Oral BID  . levothyroxine  50 mcg Oral QAC breakfast  . midodrine  5 mg Oral TID WC  . sodium chloride flush  3 mL Intravenous Q12H  . thiamine  100 mg Oral Daily    Dialysis Orders: MWF at [facility] 4hr, 400/800, 3K/2.25Ca, EDW 59.5kg, UF #2/linear Na, TDC, no heparin. - Mircera IV q 2 weeks (last given 09/24/17) - No VDRA/venofer.  Assessment/Plan: 1.  AMS: Ongoing for past 2 weeks per note,  worsening. Reported as acute on CT, on MRI looks more chronic in nature.  Neurology less inclined to believe this is an acute cva  She is being evaluated with Carotid 1-39% stenosis on Doppler, Echo  Normal EF and thiamine 100mg  daily other causes would include, infection, hepatic encephalopathy, etc.possibly due to underlying dementia elevated TSH and electrolyte imbalance  2.  ESRD: Missed HD on 6/17, s/p HD 6/19. Essentially no UF d/t hypotension. She had dialysis 6/21 with 2 L off we shall schedule dialysis 6/24     Hypertension/volume: BP low, + visible edema on exam. Has been losing actually body weight rapidly over the past few weeks - EDW has been dropped ~ 6kg within the past weeks and + edema today indicating that it needs to be dropped further. UF as tolerated. 3. Hypotension   She continues on midodrine  4.  Anemia of CKD, + FOBT: Hgb 10.1 -> 8.6 > 7.7  , next due for ESA dosing on 6/24 will order 100mcg Darbepoientin  + FOBT. I think she may benefit from transfusion  - this can be done on dialysis  Will defer to primary team  5.  Metabolic bone disease: Ca ok, no binders for now. Follow. 6.  Nutrition:  Alb 1.9, very low in setting of not eating very well   Will add prostat 30cc bid 7.  Hx alcoholic  cirrhosis: Ammonia ^, continue lactulose. 8.  Hypothyroidism: TSH high, per primary. Synthroid 50 mcg  Daily  9.  Type 2 DM: Hypoglycemic at times, per primary. 10.  Dispo: Chronic medical decline and now with acute illness she is continuing to loose ground particularly in the setting of dementia and her acute illness.   she is no longer being able to be cared for at home  Social work working on placement in SNF       Elvis CoilMartin Annessa Satre MD [facility] LPFACP  [facility] Pager: [phone]  [phone]

## 2017-10-08 DIAGNOSIS — Z992 Dependence on renal dialysis: Secondary | ICD-10-CM

## 2017-10-08 DIAGNOSIS — F0151 Vascular dementia with behavioral disturbance: Secondary | ICD-10-CM

## 2017-10-08 DIAGNOSIS — G9340 Encephalopathy, unspecified: Secondary | ICD-10-CM

## 2017-10-08 DIAGNOSIS — E875 Hyperkalemia: Principal | ICD-10-CM

## 2017-10-08 DIAGNOSIS — Z515 Encounter for palliative care: Secondary | ICD-10-CM

## 2017-10-08 DIAGNOSIS — F01518 Vascular dementia, unspecified severity, with other behavioral disturbance: Secondary | ICD-10-CM

## 2017-10-08 DIAGNOSIS — N186 End stage renal disease: Secondary | ICD-10-CM

## 2017-10-08 LAB — COMPREHENSIVE METABOLIC PANEL
ALK PHOS: 102 U/L (ref 38–126)
ALT: 23 U/L (ref 14–54)
ANION GAP: 14 (ref 5–15)
AST: 39 U/L (ref 15–41)
Albumin: 2.1 g/dL — ABNORMAL LOW (ref 3.5–5.0)
BILIRUBIN TOTAL: 2 mg/dL — AB (ref 0.3–1.2)
BUN: 28 mg/dL — ABNORMAL HIGH (ref 6–20)
CO2: 27 mmol/L (ref 22–32)
Calcium: 9 mg/dL (ref 8.9–10.3)
Chloride: 94 mmol/L — ABNORMAL LOW (ref 101–111)
Creatinine, Ser: 5.71 mg/dL — ABNORMAL HIGH (ref 0.44–1.00)
GFR, EST AFRICAN AMERICAN: 8 mL/min — AB (ref >60)
GFR, EST NON AFRICAN AMERICAN: 7 mL/min — AB (ref >60)
Glucose, Bld: 100 mg/dL — ABNORMAL HIGH (ref 65–99)
Potassium: 3.9 mmol/L (ref 3.5–5.1)
SODIUM: 135 mmol/L (ref 135–145)
TOTAL PROTEIN: 7.6 g/dL (ref 6.5–8.1)

## 2017-10-08 LAB — CBC
HEMATOCRIT: 26.6 % — AB (ref 36.0–46.0)
HEMOGLOBIN: 7.8 g/dL — AB (ref 12.0–15.0)
MCH: 31.7 pg (ref 26.0–34.0)
MCHC: 29.3 g/dL — AB (ref 30.0–36.0)
MCV: 108.1 fL — ABNORMAL HIGH (ref 78.0–100.0)
Platelets: 135 10*3/uL — ABNORMAL LOW (ref 150–400)
RBC: 2.46 MIL/uL — ABNORMAL LOW (ref 3.87–5.11)
RDW: 20 % — ABNORMAL HIGH (ref 11.5–15.5)
WBC: 5 10*3/uL (ref 4.0–10.5)

## 2017-10-08 LAB — CULTURE, BLOOD (ROUTINE X 2)
CULTURE: NO GROWTH
SPECIAL REQUESTS: ADEQUATE

## 2017-10-08 LAB — GLUCOSE, CAPILLARY
GLUCOSE-CAPILLARY: 115 mg/dL — AB (ref 65–99)
GLUCOSE-CAPILLARY: 79 mg/dL (ref 65–99)
GLUCOSE-CAPILLARY: 96 mg/dL (ref 65–99)
Glucose-Capillary: 87 mg/dL (ref 65–99)
Glucose-Capillary: 82 mg/dL (ref 65–99)

## 2017-10-08 LAB — PHOSPHORUS: Phosphorus: 3.3 mg/dL (ref 2.5–4.6)

## 2017-10-08 MED ORDER — HYDROMORPHONE HCL 2 MG PO TABS: 1.0000 mg | ORAL_TABLET | ORAL | Status: DC | PRN

## 2017-10-08 MED ORDER — SENNA 8.6 MG PO TABS: 1.0000 | ORAL_TABLET | Freq: Every day | ORAL | Status: DC

## 2017-10-08 MED ORDER — DARBEPOETIN ALFA 100 MCG/0.5ML IJ SOSY
PREFILLED_SYRINGE | INTRAMUSCULAR | Status: AC
  Filled 2017-10-08: qty 0.5

## 2017-10-08 MED ORDER — HALOPERIDOL 1 MG PO TABS
1.0000 mg | ORAL_TABLET | Freq: Two times a day (BID) | ORAL | Status: DC | PRN
  Filled 2017-10-08: qty 1

## 2017-10-08 MED ORDER — HALOPERIDOL LACTATE 2 MG/ML PO CONC
1.0000 mg | Freq: Two times a day (BID) | ORAL | Status: DC | PRN
  Filled 2017-10-08: qty 0.5

## 2017-10-08 MED ORDER — HALOPERIDOL LACTATE 5 MG/ML IJ SOLN: 1.0000 mg | Freq: Two times a day (BID) | INTRAMUSCULAR | Status: DC | PRN

## 2017-10-08 MED ORDER — NEPRO/CARBSTEADY PO LIQD
237.0000 mL | Freq: Two times a day (BID) | ORAL | Status: DC
  Filled 2017-10-08 (×8): qty 237

## 2017-10-08 NOTE — Progress Notes (Signed)
PROGRESS NOTE    Samantha [name]  UJW:[id number] DOB: 1946-01-17 DOA: 10/02/2017 PCP: Fleet Contras, MD      Brief Narrative:  Samantha [name] is a 72 y.o. 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  Patient lives with her grandson who reports that she fell approximately 2 weeks ago, was evaluated in the emergency department with negative radiographs at that time, but has been bedbound since then, complaining of hip and pelvic pain.  Patient has been eating and drinking very little, essentially only taking Ensure for the past couple weeks and has been bedbound over this interval.    CT head on admission showed possible stroke, but follow up MRI showed this was chronic.  Her metabolic encephalopathy has persisted, despite dialysis, with difficulty differentiating a reversible cause.       Assessment & Plan:  Acute metabolic encephalopathy Neurology consulted.  Etiology is somewhat nebulous but the leading diagnosis is that this is progression of her dementia, with delirium in setting of pain, possibly hepatic encephalopathy. She ahs been having many stools, but no improvement in mentation.  B12 normal.  RPR negative.  Cortisol normal. MRI brain shows only old stroke.  TSH elevated but T4 normal.  Ammonia slightly elevated.  Per previous notes, Nephrology (who are obviously familiar with the patient) report she is usually pleasantly demented but chatty.  We are waiting on her T3 and ammonia repeat, but I have very low suspicion for a reversible cause of her current cognitive state. -Consult palliative care -Continue thiamine -Continue levothyroxine, follow T3, still not drawn -Await ammonia   Spoke extensively with Granddaughter on phone today.  SHe is an Charity fundraiser, familiar with grandmother's care.  Reports patient has had progressing dementia, but  was walking with a walker and quite independent with ADLs until relatively recently, even after her humerus surgery and rehab stay in December of last year.  However she has had a relatively large decline since April, which is been very alarming to family.  During that time she is been progressively complaining of lower abdominal or pelvic pain, and becoming less cognitively intact, and less functional/ambulatory.  Have taken her to the emergency room numerous times for the above complaint, without finding answers, getting answers from her PCP, or from gastroenterology.  Of note a CT abdomen and pelvis from early April shows "interval progression of left adnexal cystic mass now measuring 3.9 cm".  Family report 4 weeks, she has been "worse than ever before", essentially nonverbal, confused, calling out, falling and unable to stand.  They are questioning if this is progression of her dementia and renal disease.     Adnexal mass Unclear the clinical significance of this. 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 -Neurology consulted, no further work up at this time  Cirrhosis Hepatic encephalopathy Hypotension -Repeat ammonia -Resume lactulose today -Continue midodrine   Hypothyroidism TSH high, T4 normal.   -Obtain T3 today -Continue levothyroxine  ESRD on HD MWF -Consult to Nephrology -Consult to Palliative care  Hyperkalemia Corrected  Stool occult blood No clinical bleeding has been appreciated -Monitor stool -Treng Hgb  Diabetes A1c 4% -Accuchecks  Anemia of chronic renal disease Hgb no change  Hip pain This was one of her initial presenting complaints.  Plain radiograph negative.  She claims to have pain today (at  least, I believe this is what she is trying to say).  Per PT notes, she requires max assist to sit up and maneuver in bed, they have not tried weight bearing.  I suspect that her overall deconditioning is more an obstacle to movement than to  pain but will attempt to rule out an acute fracture. -CT pelvis ordered         DVT prophylaxis: SCDs Code Status: DO NOT RESUSCITATE Family Communication: Called and left VM for son, talked to granddaughter. MDM and disposition Plan: The below labs and imaging reports were reviewed and summarized above.    The patient was admitted with encephalopathy.  Seems to be primarily HE, which she has had recurrently.  At this time, however, seems slow to resolve, suspect that this episode has progressed her dementia to a substantial degree.  At this time, I agree with Nephrology that it is reasonable to consult Palliative Care about decision to continue dialysis   Consultants:   Neurology  Palliative Care  Nephrology  Procedures:   None  Antimicrobials:       Subjective: No fever, respiratory distress.  Mostly just calling out in pain, asking to get out of bed, agitated, not responsive to questions.     Objective: Vitals:   10/08/17 0038 10/08/17 0339 10/08/17 0500 10/08/17 0849  BP: 131/75 139/84  135/65  Pulse: 93 97  93  Resp: 20 18  20   Temp: 98.4 F (36.9 C) 98.5 F (36.9 C)  97.6 F (36.4 C)  TempSrc: Oral Oral  Oral  SpO2: 97% 100%  100%  Weight:   61.9 kg (136 lb 7.4 oz)   Height:        Intake/Output Summary (Last 24 hours) at 10/08/2017 1122 Last data filed at 10/08/2017 0138 Gross per 24 hour  Intake 480 ml  Output 450 ml  Net 30 ml   Filed Weights   10/06/17 0500 10/07/17 0448 10/08/17 0500  Weight: 54.9 kg (121 lb 0.5 oz) 61.3 kg (135 lb 2.3 oz) 61.9 kg (136 lb 7.4 oz)    Examination: General appearance: Elderly adult female, lying in bed, appears uncomfortable and agitated, repeats over and over "let me get up" and "I am hurting" HEENT: Anicteric, conjunctive are pink, lids and lashes normal.  No nasal deformity, discharge, or epistaxis.  Lips and cheeks seem puffy, edentulous, OP dry, no oral lesions, hearing unable to assess. Skin: Warm and  dry, bruising noted on the arms, no other suspicious rashes or lesions. Cardiac: Tachycardic, regular, no murmurs appreciated, no pitting of her lower extremity edema but she is relatively anasarca overall. Respiratory: Respirations appear normal, lungs clear without rales or wheezes. Abdomen: Abdomen soft, sort of diffuse tenderness to palpation, guarding, grimace to palpation, no obvious ascites, but the abdomen seems a little protuberant, no scars. MSK: Difficult to assess, no obvious deformities or effusions of the large joints of the upper or lower extremities bilaterally. Neuro: Awake and makes eye contact, but very confused.  Dysarthric.  Keeps repeating above phrases, does not respond to questions.    Psych: Unable to answer questions intelligibly, perseverating.       Data Reviewed: I have personally reviewed following labs and imaging studies:  CBC: Recent Labs  Lab 10/02/17 1953 10/02/17 2032 10/02/17 2223 10/03/17 1218 10/04/17 1320 10/05/17 0900  WBC  --  7.2  --  6.0 5.3 6.1  NEUTROABS  --  5.4  --  4.3  --   --  HGB 11.9* 10.1*  --  8.6* 7.7* 8.2*  HCT 35.0* 34.6* 28.8* 28.3* 25.4* 27.2*  MCV  --  107.8*  --  104.4* 103.7* 105.4*  PLT  --  123*  --  127* 107* 142*   Basic Metabolic Panel: Recent Labs  Lab 10/02/17 1953 10/02/17 2032 10/03/17 0617 10/03/17 1218 10/03/17 2020 10/04/17 1320 10/05/17 1242  NA 136 139 138  --   --  134* 134*  K 6.8* 5.3* 4.8 4.8 4.7 3.8 3.6  CL 103 99* 98*  --   --  95* 95*  CO2  --  24 26  --   --  30 28  GLUCOSE 84 84 122*  --   --  98 112*  BUN 75* 52* 55*  --   --  20 24*  CREATININE 8.00* 7.67* 7.96*  --   --  4.34* 5.52*  CALCIUM  --  9.1 9.4  --   --  8.2* 8.6*  MG  --   --   --   --   --  2.0 2.3  PHOS  --   --   --   --   --  2.4* 3.0   GFR: Estimated Creatinine Clearance: 7.8 mL/min (A) (by C-G formula based on SCr of 5.52 mg/dL (H)). Liver Function Tests: Recent Labs  Lab 10/02/17 2032 10/03/17 0617  10/05/17 1242  AST 53* 42*  --   ALT 27 24  --   ALKPHOS 118 110  --   BILITOT 1.8* 2.1*  --   PROT 8.0 7.5  --   ALBUMIN 2.0* 1.9* 1.7*   No results for input(s): LIPASE, AMYLASE in the last 168 hours. Recent Labs  Lab 10/02/17 2032  AMMONIA 59*   Coagulation Profile: No results for input(s): INR, PROTIME in the last 168 hours. Cardiac Enzymes: No results for input(s): CKTOTAL, CKMB, CKMBINDEX, TROPONINI in the last 168 hours. BNP (last 3 results) No results for input(s): PROBNP in the last 8760 hours. HbA1C: No results for input(s): HGBA1C in the last 72 hours. CBG: Recent Labs  Lab 10/07/17 1135 10/07/17 1633 10/07/17 2005 10/08/17 0031 10/08/17 0338  GLUCAP 79 100* 110* 115* 96   Lipid Profile: No results for input(s): CHOL, HDL, LDLCALC, TRIG, CHOLHDL, LDLDIRECT in the last 72 hours. Thyroid Function Tests: No results for input(s): TSH, T4TOTAL, FREET4, T3FREE, THYROIDAB in the last 72 hours. Anemia Panel: No results for input(s): VITAMINB12, FOLATE, FERRITIN, TIBC, IRON, RETICCTPCT in the last 72 hours. Urine analysis:    Component Value Date/Time   COLORURINE AMBER (A) 08/03/2014 1249   APPEARANCEUR CLOUDY (A) 08/03/2014 1249   LABSPEC 1.020 08/03/2014 1249   PHURINE 7.0 08/03/2014 1249   GLUCOSEU 500 (A) 08/03/2014 1249   HGBUR LARGE (A) 08/03/2014 1249   BILIRUBINUR MODERATE (A) 08/03/2014 1249   KETONESUR 15 (A) 08/03/2014 1249   PROTEINUR >300 (A) 08/03/2014 1249   UROBILINOGEN 4.0 (H) 08/03/2014 1249   NITRITE POSITIVE (A) 08/03/2014 1249   LEUKOCYTESUR TRACE (A) 08/03/2014 1249   Sepsis Labs: @LABRCNTIP (procalcitonin:4,lacticacidven:4)  ) Recent Results (from the past 240 hour(s))  Blood Culture (routine x 2)     Status: None (Preliminary result)   Collection Time: 10/03/17  8:20 AM  Result Value Ref Range Status   Specimen Description BLOOD RIGHT HAND  Final   Special Requests   Final    BOTTLES DRAWN AEROBIC AND ANAEROBIC Blood Culture  adequate volume   Culture   Final  NO GROWTH 4 DAYS Performed at [facility], [address] [place], [place] [address]    Report Status PENDING  Incomplete         Radiology Studies: No results found.      Scheduled Meds: . aspirin EC  81 mg Oral Daily  . Chlorhexidine Gluconate Cloth  6 each Topical Q0600  . Chlorhexidine Gluconate Cloth  6 each Topical Q0600  . darbepoetin (ARANESP) injection - DIALYSIS  100 mcg Intravenous Q Mon-HD  . feeding supplement (NEPRO CARB STEADY)  237 mL Oral BID BM  . feeding supplement (PRO-STAT SUGAR FREE 64)  30 mL Oral BID  . lactulose  20 g Oral BID  . levothyroxine  50 mcg Oral QAC breakfast  . midodrine  5 mg Oral TID WC  . sodium chloride flush  3 mL Intravenous Q12H  . thiamine  100 mg Oral Daily   Continuous Infusions:   LOS: 6 days    Time spent: 25 minutes    Alberteen Sam, MD [facility] 10/08/2017, 11:22 AM     Pager [phone] --- please page though AMION:  [email or web address] Password [email or web address] If 7PM-7AM, please contact night-coverage

## 2017-10-08 NOTE — Consult Note (Signed)
Consultation Note Date: 10/08/2017   Patient Name: Samantha [name]  DOB: 1945/12/17  MRN: [id number]  Age / Sex: 72 y.o., female  PCP: Fleet Contras, MD Referring Physician: Alberteen Sam, *  Reason for Consultation: Establishing goals of care and Psychosocial/spiritual support  HPI/Patient Profile: 72 y.o. female  with past medical history of ESRD, DM, PUD and GI bleed, dementia, hepatitis B cirrhosis, diastolic CHF, and left adnexal mass who was admitted on 10/02/2017 with altered mental status.    Her ammonia level was slightly elevated, but the cause of her altered mental status remains unclear.  MRI shows multiple old strokes.   There is concern for progressive dementia.  She is severely anemic 7.7 without frank bleeding.  She has been eating very little and has been hypoglycemic.  Her albumin is 1.7.   Of note her echocardiogram shows grade 2 diastolic dysfunction with severe mitral regurgitation and elevated pulmonary artery pressure.  Clinical Assessment and Goals of Care:   I have reviewed medical records including EPIC notes, labs and imaging, received report from the care team, assessed the patient and then spoke on the phone to her grand daughter and co-health care power of attorney, Sandrea Hughs,  to discuss diagnosis prognosis, GOC, EOL wishes, disposition and options.  I introduced Palliative Medicine as specialized medical care for people living with serious illness. It focuses on providing relief from the symptoms and stress of a serious illness. The goal is to improve quality of life for both the patient and the family.  We discussed a brief life review of the patient. She worked picking tobacco.  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  Her grand children spent summers at her house.  5 years ago her husband passed away.  She and her grand son live together.  The  patient's daughter passed away unexpectedly not long ago.  This was particularly hard on the patient's grandson as he did CPR in an attempt to save his mother.  As far as functional and nutritional status she fractured her arm in May of 2018, and finally had surgery to correct the fracture in November of 2018.  She did remarkably well in rehab after surgery.  In March of 2019 she began to slow down and need assistance.  She has had some difficulty with falls and her eating has declined.  For the last two weeks she has been bed bound.  She is very disagreeable at home (not wanting to be bathed or have bowel movements cleaned up) and is in pain much of the time.  The family is unable to care for her at home.  Per Sandrea Hughs her grandmother has always said "never put me in a nursing home".  We discussed their current illness and what it means in the larger context of their on-going co-morbidities.  Natural disease trajectory and expectations at EOL were discussed.  I attempted to elicit values and goals of care important to the patient.  The difference between aggressive medical intervention  and comfort care was considered in light of the patient's goals of care.   We discussed ending dialysis.  Sandrea HughsShamonica realizes this would mean [facility] and end [facility] life.  She feels her grandmother's quality of life at this point is very poor and that keeping her alive would be a selfish act.  Hospice and Palliative Care services outpatient were explained and offered.  Shamonica's brother needs to be a part of a detailed GOC conversation.  We scheduled a follow up meeting for tomorrow at 3:00 pm.   Primary Decision Maker:  NEXT OF [facility] Sh[place]ica [place] and [place].    SUMMARY OF RECOMMENDATIONS    Follow up meeting 6/25 at 3 pm with Sandrea HughsShamonica and Onalee Huaavid to determine further GOC.  Hospice House?  Code Status/Advance Care Planning:  DNR  Symptom Management:   Agree with IV fentanyl for  severe pain  Will add low dose PO dilaudid PRN moderate pain  Will add senna tab   Low dose haldol PRN agitation   Prognosis:  Likely less than 2 months if current level of medical care is continued.  Less than 2 weeks if a shift to full comfort is made.    Discharge Planning: To Be Determined  SNF vs HH      Primary Diagnoses: Present on Admission: . Anemia in chronic kidney disease (CKD) . Hyperkalemia . Acute encephalopathy . Stroke (cerebrum) (HCC) . Hypothyroidism . Hip pain . Hypoglycemia without diagnosis of diabetes mellitus   I have reviewed the medical record, interviewed the patient and family, and examined the patient. The following aspects are pertinent.  Past Medical History:  Diagnosis Date  . Anemia   . Arthritis    left shoulder  . Cellulitis of left upper extremity   . Constipation   . Dementia   . Diabetes mellitus without complication (HCC)    borderline  . Dizziness   . Dry skin   . Gastric ulcer   . GERD (gastroesophageal reflux disease)   . GI bleed 06/06/2011  . Gram-negative bacteremia   . H/O: GI bleed   . Headache(784.0)    occasionally  . Hepatitis    Hep B  . History of blood transfusion   . History of kidney stones   . Hypertension   . Hypothyroidism    takes Synthroid daily  . Impaired hearing   . Joint pain   . Joint swelling   . Mechanical complication of other vascular device, implant, and graft 10/08/2013  . Occasional numbness/prickling/tingling of fingers and toes   . Oligouria   . Pancreatitis 04/10/2012  . Peripheral edema   . Renal disorder    m, w, F   . Secondary hyperparathyroidism (HCC) 08/19/2016  . Sepsis (HCC) 06/14/2015  . Shoulder pain   . Vaginal bleeding 08/15/2015   Social History   Socioeconomic History  . Marital status: Widowed    Spouse name: Not on file  . Number of children: Not on file  . Years of education: Not on file  . Highest education level: Not on file  Occupational History  .  Not on file  Social Needs  . Financial resource strain: Not on file  . Food insecurity:    Worry: Not on file    Inability: Not on file  . Transportation needs:    Medical: Not on file    Non-medical: Not on file  Tobacco Use  . Smoking status: Never Smoker  . Smokeless tobacco: Current  User    Types: Chew  Substance and Sexual Activity  . Alcohol use: No    Alcohol/week: 0.0 oz    Comment: quit 4-13yrs ago  . Drug use: No  . Sexual activity: Not Currently    Birth control/protection: Post-menopausal  Lifestyle  . Physical activity:    Days per week: Not on file    Minutes per session: Not on file  . Stress: Not on file  Relationships  . Social connections:    Talks on phone: Not on file    Gets together: Not on file    Attends religious service: Not on file    Active member of club or organization: Not on file    Attends meetings of clubs or organizations: Not on file    Relationship status: Not on file  Other Topics Concern  . Not on file  Social History Narrative  . Not on file   Family History  Problem Relation Age of Onset  . Hypertension Mother   . Diabetes Mother   . Cancer Brother    Scheduled Meds: . aspirin EC  81 mg Oral Daily  . Chlorhexidine Gluconate Cloth  6 each Topical Q0600  . Chlorhexidine Gluconate Cloth  6 each Topical Q0600  . darbepoetin (ARANESP) injection - DIALYSIS  100 mcg Intravenous Q Mon-HD  . feeding supplement (NEPRO CARB STEADY)  237 mL Oral BID BM  . feeding supplement (PRO-STAT SUGAR FREE 64)  30 mL Oral BID  . lactulose  20 g Oral BID  . levothyroxine  50 mcg Oral QAC breakfast  . midodrine  5 mg Oral TID WC  . sodium chloride flush  3 mL Intravenous Q12H  . thiamine  100 mg Oral Daily   Continuous Infusions: PRN Meds:.acetaminophen **OR** [DISCONTINUED] acetaminophen, fentaNYL (SUBLIMAZE) injection, ondansetron **OR** ondansetron (ZOFRAN) IV Allergies  Allergen Reactions  . Aspirin Other (See Comments)    Per Md.  .  Banana Other (See Comments)    Due to dialysis  . Chocolate Other (See Comments)    Due to dialysis  . Fish-Derived Products Other (See Comments)    Due to gout   Review of Systems patient demented and unable to answer.  Physical Exam  Well developed elderly female, moderately advanced dementia, awake, alert, unable to answer questions CV irreg irreg Resp no distress Abdomen soft, ND Sacrum not examined.  Wound documented.  Vital Signs: BP (!) 104/59 (BP Location: Right Arm)   Pulse 91   Temp (!) 97.4 F (36.3 C) (Oral)   Resp 16   Ht 5\' 4"  (1.626 m)   Wt 61.9 kg (136 lb 7.4 oz)   SpO2 99%   BMI 23.42 kg/m  Pain Scale: Faces   Pain Score: 0-No pain   SpO2: SpO2: 99 % O2 Device:SpO2: 99 % O2 Flow Rate: .O2 Flow Rate (L/min): 1 L/min  IO: Intake/output summary:   Intake/Output Summary (Last 24 hours) at 10/08/2017 1245 Last data filed at 10/08/2017 0138 Gross per 24 hour  Intake 480 ml  Output 450 ml  Net 30 ml    LBM: Last BM Date: 10/05/17 Baseline Weight: Weight: 77.1 kg (170 lb) Most recent weight: Weight: 61.9 kg (136 lb 7.4 oz)     Palliative Assessment/Data: 20%     Time In: 12:45 Time Out: 1:55 Time Total: 70 min. Greater than 50%  of this time was spent counseling and coordinating care related to the above assessment and plan.  Signed by: Norvel Richards, PA-C  Palliative Medicine Pager: [phone]  Please contact Palliative Medicine Team phone at [phone] for questions and concerns.  For individual provider: See Shea Evans

## 2017-10-08 NOTE — Progress Notes (Addendum)
[facility] Progress Note   Subjective: Confused, difficult to understand pt most of time, but has been calling her mother.    Objective Vitals:   10/08/17 0500 10/08/17 0849 10/08/17 1126 10/08/17 1350  BP:  135/65 (!) 104/59 (!) (P) 160/112  Pulse:  93 91 (P) 66  Resp:  20 16 (P) 18  Temp:  97.6 F (36.4 C) (!) 97.4 F (36.3 C) (P) 97.7 F (36.5 C)  TempSrc:  Oral Oral (P) Oral  SpO2:  100% 99% (P) 98%  Weight: 61.9 kg (136 lb 7.4 oz)   (P) 58.1 kg (128 lb 1.4 oz)  Height:       Physical Exam General: Chronically ill appearing female, mumbling. NAD Heart: S1,S2, 2/6 systolc M Lungs: CTAB anteriorly Abdomen: S,NT. Incontinent of stool.  Extremities: No edema today-has fat ankles.  Dialysis Access: RIJ TDC blood lines connected.   Additional Objective Labs: Basic Metabolic Panel: Recent Labs  Lab 10/03/17 0617  10/03/17 2020 10/04/17 1320 10/05/17 1242  NA 138  --   --  134* 134*  K 4.8   < > 4.7 3.8 3.6  CL 98*  --   --  95* 95*  CO2 26  --   --  30 28  GLUCOSE 122*  --   --  98 112*  BUN 55*  --   --  20 24*  CREATININE 7.96*  --   --  4.34* 5.52*  CALCIUM 9.4  --   --  8.2* 8.6*  PHOS  --   --   --  2.4* 3.0   < > = values in this interval not displayed.   Liver Function Tests: Recent Labs  Lab 10/02/17 2032 10/03/17 0617 10/05/17 1242  AST 53* 42*  --   ALT 27 24  --   ALKPHOS 118 110  --   BILITOT 1.8* 2.1*  --   PROT 8.0 7.5  --   ALBUMIN 2.0* 1.9* 1.7*   No results for input(s): LIPASE, AMYLASE in the last 168 hours. CBC: Recent Labs  Lab 10/02/17 2032  10/03/17 1218 10/04/17 1320 10/05/17 0900  WBC 7.2  --  6.0 5.3 6.1  NEUTROABS 5.4  --  4.3  --   --   HGB 10.1*  --  8.6* 7.7* 8.2*  HCT 34.6*   < > 28.3* 25.4* 27.2*  MCV 107.8*  --  104.4* 103.7* 105.4*  PLT 123*  --  127* 107* 142*   < > = values in this interval not displayed.   Blood Culture    Component Value Date/Time   SDES BLOOD RIGHT HAND 10/03/2017  0820   SPECREQUEST  10/03/2017 0820    BOTTLES DRAWN AEROBIC AND ANAEROBIC Blood Culture adequate volume   CULT  10/03/2017 0820    NO GROWTH 5 DAYS Performed at [facility], [address] [place], [place] [address]    REPTSTATUS 10/08/2017 FINAL 10/03/2017 0820    Cardiac Enzymes: No results for input(s): CKTOTAL, CKMB, CKMBINDEX, TROPONINI in the last 168 hours. CBG: Recent Labs  Lab 10/07/17 1633 10/07/17 2005 10/08/17 0031 10/08/17 0338 10/08/17 1156  GLUCAP 100* 110* 115* 96 79   Iron Studies: No results for input(s): IRON, TIBC, TRANSFERRIN, FERRITIN in the last 72 hours. @lablastinr3 @ Studies/Results: No results found. Medications:  . aspirin EC  81 mg Oral Daily  . Chlorhexidine Gluconate Cloth  6 each Topical Q0600  . Chlorhexidine Gluconate Cloth  6 each Topical Q0600  .  darbepoetin (ARANESP) injection - DIALYSIS  100 mcg Intravenous Q Mon-HD  . feeding supplement (NEPRO CARB STEADY)  237 mL Oral BID BM  . feeding supplement (PRO-STAT SUGAR FREE 64)  30 mL Oral BID  . lactulose  20 g Oral BID  . levothyroxine  50 mcg Oral QAC breakfast  . midodrine  5 mg Oral TID WC  . senna  1 tablet Oral QHS  . sodium chloride flush  3 mL Intravenous Q12H  . thiamine  100 mg Oral Daily     Dialysis [facility]: [facility], 400/800, 3K/2.25Ca, EDW 59.5kg, UF #2/linear Na, TDC, no heparin. - Mircera 225mcg IV q 2 weeks (last given 09/24/17) - No VDRA/venofer.  Assessment/Plan: 1. ZOX:WRUEAVWAMS:Ongoing for past 2 weeks per note, worsening. Reported as acute on CT, on MRI looks more chronic in nature.  Neurology less inclined to believe this is an acute cva  She is being evaluated with Carotid 1-39% stenosis on Doppler, Echo  Normal EF and thiamine 100mg  daily other causes would include, infection, hepatic encephalopathy, etc.possibly due to underlying dementia elevated TSH and electrolyte imbalance  2. ESRD:MWF HD today on schedule.  3. Hypertension/volume:BP low, +  visible edema on exam. Has been losing actually body weight rapidly over the past few weeks - EDW has been dropped ~ 6kg within the past weeks and + edema today indicating that it needs to be dropped further. UF 3.5 today.  4. Hypotension   She continues on midodrine  5. Anemiaof CKD, + FOBT:Hgb 10.1 -> 8.6 > 7.7  , next due for ESA dosing on 6/24 will order 100mcg Darbepoientin  + FOBT. I think she may benefit from transfusion  - this can be done on dialysis  Will defer to primary team  6. Metabolic bone disease:Ca ok, no binders for now. Follow. 7. Nutrition:Alb 1.9, very low in setting of not eating very well   Will add prostat 30cc bid 8. Hx alcoholic cirrhosis: Ammonia ^, continue lactulose. 9. Hypothyroidism: TSH high, per primary. Synthroid 50 mcg  Daily  10. Type 2 DM: Hypoglycemic at times, per primary. 11. Dispo: Chronic medical decline and now with acute illness she is continuing to loose ground particularly in the setting of dementia and her acute illness.   she is no longer being able to be cared for at home  Social work working on placement. Now DNR.    Rita H. Brown NP-C 10/08/2017, 4:00 PM  [facility] [phone]  Pt seen, examined and agree w A/P as above.  Vinson Moselleob Arvis Zwahlen MD [facility] pager [phone]   10/08/2017, 5:40 PM

## 2017-10-08 NOTE — Progress Notes (Signed)
Initial Nutrition Assessment  DOCUMENTATION CODES:   (will assess for malnutrition at follow-up.)  INTERVENTION:  - Will order Nepro Shake po BID, each supplement provides 425 kcal and 19 grams protein. - Continue 30 mL Prostat BID, each supplement provides 100 kcal and 15 grams of protein. - Continue to encourage PO intakes. - Likely will continue to need feeding assistance from tech or RN.   NUTRITION DIAGNOSIS:   Increased nutrient needs related to chronic illness(ESRD on HD) as evidenced by estimated needs.  GOAL:   Patient will meet greater than or equal to 90% of their needs  MONITOR:   PO intake, Supplement acceptance, Weight trends, Labs  REASON FOR ASSESSMENT:   Low Braden  ASSESSMENT:   [age] y.o. 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  Patient lives with her grandson who reports that she fell approximately 2 weeks ago, was evaluated in the emergency department with negative radiographs at that time, but has been bedbound since then, complaining of hip and pelvic pain.  Patient has been eating and drinking very little, essentially only taking Ensure for the past couple weeks and has been bedbound over this interval.    BMI indicates normal weight. Per chart review, pt consumed 10% of breakfast on 6/21 (52 kcal, 2 grams of protein) and 50% of dinner last night (410 kcal, 13 grams of protein). Breakfast tray in the room and it appears that patient may have taken a few bites. Prostat ordered BID yesterday and patient has accepted all 3 packets of supplement since that time. No family/visitors at bedside and flow sheet states that patient is a/o to self only. RD attempted to elicit information from patient but she was very fixated on wanting to get out of bed and became frustrated that RD would not assist her out of bed. No  information about to be obtained at this time.   NFPE outlined below. Per chart review, pt has lost 30 lbs (18% body weight) in the past 7 months. This is significant for time frame, but it is uncertain how much of this weight may have been fluid related for patient on HD. Will monitor weight trends closely throughout admission. Patient may meet criteria for malnutrition, but do not feel comfortable identifying malnutrition at this time.   Per Dr. Sheryn Bisonanford's note yesterday morning: acute metabolic encephalopathy compounded by dementia, R parietal lobe CVA with no further workup planned at this time, cirrhosis with hepatic encephalopathy, resolved hyperkalemia, DM with most recent HgbA1c of 4%, anemia of chronic renal disease, hip pain with radiograph negative and requiring max assist to sit up and maneuver in bed and weight bearing has not yet been attempted.  Medications reviewed; 20 mg lactulose BID, 50 mcg oral Synthroid/day, 100 mg thiamine/day.  Labs reviewed from 6/22; CBGs: 115 and 96 mg/dL this AM, Na: 161134 mmol/L, Cl: 95 mmol/L, BUN: 24 mg/dL, creatinine: 0.965.52 mg/dL, Ca: 8.6 mg/dL, GFR: 8 mL/min.        NUTRITION - FOCUSED PHYSICAL EXAM:  Completed; lower body not able to be assessed but upper body assessed with no muscle and no fat wasting noted. Flow sheet indicates mild edema to BLE, LUE and moderate edema to RLE.  Diet Order:   Diet Order           DIET - DYS 1 Room service appropriate? Yes;  Fluid consistency: Thin  Diet effective now          EDUCATION NEEDS:   Not appropriate for education at this time  Skin:  Skin Assessment: Skin Integrity Issues: Skin Integrity Issues:: Unstageable Unstageable: full thickness to bilateral buttocks; flow sheet states d/t moisture and not pressure injury  Last BM:  6/24  Height:   Ht Readings from Last 1 Encounters:  10/02/17 5\' 4"  (1.626 m)    Weight:   Wt Readings from Last 1 Encounters:  10/08/17 136 lb 7.4 oz (61.9 kg)     Ideal Body Weight:  54.54 kg  BMI:  Body mass index is 23.42 kg/m.  Estimated Nutritional Needs:   Kcal:  7253-6644 (30-33 kcal/kg)  Protein:  93-105 grams (1.5-1.7 grams/kg)  Fluid:  UOP + 1L/day     Trenton Gammon, MS, RD, LDN, CNSC Inpatient Clinical Dietitian Pager # [phone] After hours/weekend pager # [phone]

## 2017-10-08 NOTE — Progress Notes (Signed)
Physical Therapy Cancellation Note   10/08/17 1419  PT Visit Information  Last PT Received On 10/08/17  Reason Eval/Treat Not Completed Patient at procedure or test/unavailable (Pt in HD. PT will continue to follow acutely. )   Erline LevineKellyn Ashante Yellin, PTA Pager: [phone]

## 2017-10-09 DIAGNOSIS — Z515 Encounter for palliative care: Secondary | ICD-10-CM

## 2017-10-09 DIAGNOSIS — Z7189 Other specified counseling: Secondary | ICD-10-CM

## 2017-10-09 DIAGNOSIS — I63411 Cerebral infarction due to embolism of right middle cerebral artery: Secondary | ICD-10-CM

## 2017-10-09 LAB — GLUCOSE, CAPILLARY: GLUCOSE-CAPILLARY: 72 mg/dL (ref 70–99)

## 2017-10-09 MED ORDER — SODIUM CHLORIDE 0.9% FLUSH: 3.0000 mL | Freq: Two times a day (BID) | INTRAVENOUS | Status: DC

## 2017-10-09 MED ORDER — GLYCOPYRROLATE 0.2 MG/ML IJ SOLN: 0.2000 mg | INTRAMUSCULAR | Status: DC | PRN

## 2017-10-09 MED ORDER — ACETAMINOPHEN 325 MG PO TABS: 650.0000 mg | ORAL_TABLET | Freq: Four times a day (QID) | ORAL | Status: DC | PRN

## 2017-10-09 MED ORDER — HYDROMORPHONE HCL 1 MG/ML IJ SOLN
0.5000 mg | INTRAMUSCULAR | Status: DC | PRN
  Administered 2017-10-09: 0.5 mg via INTRAVENOUS
  Filled 2017-10-09: qty 0.5

## 2017-10-09 MED ORDER — GLYCOPYRROLATE 1 MG PO TABS
1.0000 mg | ORAL_TABLET | ORAL | Status: DC | PRN
  Filled 2017-10-09: qty 1

## 2017-10-09 MED ORDER — HALOPERIDOL LACTATE 2 MG/ML PO CONC
0.5000 mg | ORAL | Status: DC | PRN
  Filled 2017-10-09: qty 0.3

## 2017-10-09 MED ORDER — SODIUM CHLORIDE 0.9% FLUSH: 3.0000 mL | INTRAVENOUS | Status: DC | PRN

## 2017-10-09 MED ORDER — POLYVINYL ALCOHOL 1.4 % OP SOLN: 1.0000 [drp] | Freq: Four times a day (QID) | OPHTHALMIC | Status: DC | PRN

## 2017-10-09 MED ORDER — ACETAMINOPHEN 650 MG RE SUPP: 650.0000 mg | Freq: Four times a day (QID) | RECTAL | Status: DC | PRN

## 2017-10-09 MED ORDER — HALOPERIDOL LACTATE 5 MG/ML IJ SOLN: 0.5000 mg | INTRAMUSCULAR | Status: DC | PRN

## 2017-10-09 MED ORDER — BIOTENE DRY MOUTH MT LIQD: 15.0000 mL | OROMUCOSAL | Status: DC | PRN

## 2017-10-09 MED ORDER — LACTULOSE 10 GM/15ML PO SOLN: 20.0000 g | Freq: Every day | ORAL | Status: DC

## 2017-10-09 MED ORDER — HALOPERIDOL 0.5 MG PO TABS
0.5000 mg | ORAL_TABLET | ORAL | Status: DC | PRN
  Filled 2017-10-09: qty 1

## 2017-10-09 MED ORDER — HYDROMORPHONE HCL 1 MG/ML IJ SOLN: 0.5000 mg | INTRAMUSCULAR | Status: DC | PRN

## 2017-10-09 MED ORDER — SODIUM CHLORIDE 0.9 % IV SOLN: 250.0000 mL | INTRAVENOUS | Status: DC | PRN

## 2017-10-09 NOTE — Progress Notes (Signed)
Daily Progress Note   Patient Name: Samantha [name]       Date: 10/09/2017 DOB: 1945/12/17  Age: 72 y.o. MRN#: [id number] Attending Physician: Alberteen Sam, * Primary Care Physician: Fleet Contras, MD Admit Date: 10/02/2017  Reason for Consultation/Follow-up: Establishing goals of care, Hospice Evaluation, Psychosocial/spiritual support and Terminal Care  Subjective: Meeting with Georg Ruddle, Dr. Maryfrances Bunnell, [facility] PA-S, and myself.   We discussed the patient's decline and what she would want if she could speak for herself.  The family pointed out that she has not been herself for months.  She no longer wants to take oral nutrition for them.  She was adamant about not wanting to live in a facility.  Onalee Hua and Sandrea Hughs understand that her mental status is not going to improve - her dementia is progressive.  They ask for her to be made comfortable.     We discussed hospice house.  The family was deeply saddened but after a few moments of private conversation they agreed that Hospice House was the best place for their grandmother at this point in her life.     Assessment: Patient with ESRD, Cirrhosis, Cirrhosis, Dementia, severe anemia, and malnutrition.  Admitted with encephalopathy that has not resolved.       Patient Profile/HPI: 72 y.o. female  with past medical history of ESRD, DM, PUD and GI bleed, dementia, hepatitis B cirrhosis, diastolic CHF, and left adnexal mass who was admitted on 10/02/2017 with altered mental status.    Her ammonia level was slightly elevated, but the cause of her altered mental status remains unclear.  MRI shows multiple old strokes.   There is concern for progressive dementia.  She is severely anemic 7.7 without frank bleeding.  She has been  eating very little and has been hypoglycemic.  Her albumin is 1.7.   Of note her echocardiogram shows grade 2 diastolic dysfunction with severe mitral regurgitation and elevated pulmonary artery pressure.     Length of Stay: 7  Current Medications: Scheduled Meds:  . aspirin EC  81 mg Oral Daily  . Chlorhexidine Gluconate Cloth  6 each Topical Q0600  . Chlorhexidine Gluconate Cloth  6 each Topical Q0600  . darbepoetin (ARANESP) injection - DIALYSIS  100 mcg Intravenous Q Mon-HD  . feeding supplement (NEPRO CARB STEADY)  237 mL Oral BID BM  . feeding supplement (PRO-STAT SUGAR FREE 64)  30 mL Oral BID  . lactulose  20 g Oral BID  . levothyroxine  50 mcg Oral QAC breakfast  . midodrine  5 mg Oral TID WC  . senna  1 tablet Oral QHS  . sodium chloride flush  3 mL Intravenous Q12H  . thiamine  100 mg Oral Daily    Continuous Infusions:   PRN Meds: acetaminophen **OR** [DISCONTINUED] acetaminophen, fentaNYL (SUBLIMAZE) injection, haloperidol, HYDROmorphone, ondansetron **OR** ondansetron (ZOFRAN) IV  Physical Exam       Well developed elderly female, awake, sitting in chair, tells me she is cold (as she smiles) CV irreg irreg Resp no distress on 2L, no w/c/r Abdomen soft, nt, nd  Vital Signs: BP 105/61 (BP Location: Right Arm)   Pulse 90   Temp (!) 97.3 F (36.3 C) (Oral)   Resp 20   Ht 5\' 4"  (1.626 m)   Wt 58.9 kg (129 lb 13.6 oz)   SpO2 99%   BMI 22.29 kg/m  SpO2: SpO2: 99 % O2 Device: O2 Device: Room Air O2 Flow Rate: O2 Flow Rate (L/min): 2.5 L/min  Intake/output summary:   Intake/Output Summary (Last 24 hours) at 10/09/2017 1334 Last data filed at 10/09/2017 0758 Gross per 24 hour  Intake 10 ml  Output 4453 ml  Net -4443 ml   LBM: Last BM Date: 10/08/17 Baseline Weight: Weight: 77.1 kg (170 lb) Most recent weight: Weight: 58.9 kg (129 lb 13.6 oz)       Palliative Assessment/Data: 20%      Patient Active Problem List   Diagnosis Date Noted  .  Vascular dementia with behavior disturbance   . Palliative care encounter   . Hyperkalemia 10/02/2017  . Stroke (cerebrum) (HCC) 10/02/2017  . Hypothyroidism 10/02/2017  . Hip pain 10/02/2017  . Hypoglycemia without diagnosis of diabetes mellitus 10/02/2017  . SIRS (systemic inflammatory response syndrome) (HCC)   . S/P ORIF (open reduction internal fixation) fracture 02/20/2017  . 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 Secondary hyperparathyroidism (HCC) 08/19/2016  . Left supracondylar humerus fracture, closed, initial encounter 08/18/2016  . Adnexal mass   . ESRD on dialysis (HCC)   . Acute encephalopathy 06/14/2015  . 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 Thrombocytopenia (HCC) 12/25/2014  . 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 Cardiomegaly 12/25/2014  . Hyponatremia 04/12/2012  . SOB (shortness of breath) 04/08/2012  . Symptomatic cholelithiasis 11/16/2011  . Transaminitis 06/08/2011  . Hepatitis B antibody positive 06/08/2011  . ESRD on hemodialysis (HCC) 06/06/2011  . HTN (hypertension) 06/06/2011  . Shoulder pain 06/06/2011  . Anemia in chronic kidney disease (CKD) 06/06/2011    Palliative Care Plan    Recommendations/Plan:  Shift to full comfort.  No further hemodialysis.  Orders adjusted.   I left a minimal amount of lactulose (1 dose / day) as the family would like to stave off confusion as long as possible.  [facility] to [facility] as soon as a bed is available.  Goals of Care and Additional Recommendations:  Limitations on Scope of Treatment: Full Comfort Care  Code Status:  DNR  Prognosis:   < 2 weeks   Discharge Planning:  Hospice facility  Care plan was discussed with [facility] MD, Family, bedside RN  Thank you for allowing the Palliative Medicine Team to assist in the care of this patient.  Total  time spent:  60 min.     Greater than 50%  of this time was  spent counseling and coordinating care related to the above assessment and plan.  Norvel RichardsMarianne Cashe Gatt, PA-C Palliative Medicine  Please contact Palliative MedicineTeam phone at [phone] for questions and concerns between 7 am - 7 pm.   Please see AMION for individual provider pager numbers.

## 2017-10-09 NOTE — Progress Notes (Signed)
CSW received consult for residential hospice placement- RNCM confirmed [facility] as first choice with family- [facility]- unable to accept today but will follow up with family tomorrow regarding possible admission pending bed availability and medical approval  Burna SisJenna H. Joliana Claflin, LCSW Clinical Social Worker [phone]

## 2017-10-09 NOTE — Progress Notes (Signed)
Occupational Therapy Treatment Patient Details Name: Samantha [name] M Vari MRN: [id number] DOB: 04-21-1945 Today's Date: 10/09/2017    History of present illness 72 y.o.BF PMHx Dementia, ESRD on HD M/W/F  Hypothyroidism, pancreatitis, hepatitis B, diabetes type II uncontrolled with complication, GI bleed, nephrolithiasis, L humerus fx with ORIF; REverse total shoulder,  HTN,recent fall, and decline in her physical and cognitive status over the past 2 weeks. Pt with acute encephalopathy and R parietal CVA.    OT comments  Pt still needed total assist to total assist +2 for supine to sit and for transfer squat pivot from bed to bedside recliner.  Recommend nursing use maximove for transfer back to bed for safety.  Pt not answering questions asked by therapist but instead stating "please help me up".  Once in the bedside chair she was able to drink from her cup using the left hand and also self feed some of her magic cup.  [facility]  Will continued to follow in acute care until discharge.  Follow Up Recommendations  SNF    Equipment Recommendations  None recommended by OT       Precautions / Restrictions Precautions Precautions: Fall Precaution Comments: at risk for skin breakdown Restrictions Weight Bearing Restrictions: No       Mobility Bed Mobility Overal bed mobility: Needs Assistance Bed Mobility: Rolling;Supine to Sit Rolling: Max assist   Supine to sit: Total assist     General bed mobility comments: Total assist + 2 for straighteing trunk in bed.   Transfers Overall transfer level: Needs assistance   Transfers: Squat Pivot Transfers     Squat pivot transfers: +2 physical assistance;Total assist          Balance Overall balance assessment: Needs assistance   Sitting balance-Leahy Scale: Poor Sitting balance - Comments: Posterior lean     Standing balance-Leahy Scale: Zero Standing balance comment: total assist  needed for partial stand with squat pivot transfer                           ADL either performed or assessed with clinical judgement   ADL Overall ADL's : Needs assistance/impaired Eating/Feeding: Set up;Sitting                           Toileting- Clothing Manipulation and Hygiene: Bed level;Total assistance       Functional mobility during ADLs: Total assistance;+2 for physical assistance General ADL Comments: Pt incontinent of bowel at start of session.  Completed rolling side to side to clean up and place new pad with overall max assist.  Total assist for transition from supine to sit EOB.  Once sitting she demonstrated posterior lean, but was able to correct with mod facilitation.  Pt asking to "Pleased help me up" repeatedly throughout session.  She would not respond directly with any questions that were asked from therapist.  She needed total assist for squat pivot transfer to the bedside recliner where she worked on self feeding and drinking with setup assistance.                Cognition Arousal/Alertness: Awake/alert Behavior During Therapy: Restless;Anxious Overall Cognitive Status: History of cognitive impairments - at baseline  General Comments: Pt not answering therapist's questions but did make statements of wanting to get up and wanting "balls of ice"        Exercises General Exercises - Lower Extremity Heel Slides: AAROM;Supine;Both;Strengthening;15 reps Hip ABduction/ADduction: AAROM;Strengthening;Both;15 reps;Supine Mini-Sqauts: Supine;AROM;Strengthening;Both;15 reps           Pertinent Vitals/ Pain       Pain Assessment: Faces Faces Pain Scale: Hurts little more Pain Location: pt pointing to lower right abdomen region Pain Descriptors / Indicators: Grimacing Pain Intervention(s): Limited activity within patient's tolerance;Repositioned                   Frequency  Min 2X/week         Progress Toward Goals  OT Goals(current goals can now be found in the care plan section)  Progress towards OT goals: Progressing toward goals  Acute Rehab OT Goals Patient Stated Goal: none stated Potential to Achieve Goals: Fair  Plan Discharge plan remains appropriate       AM-PAC PT "6 Clicks" Daily Activity     Outcome Measure   Help from another person eating meals?: A Little Help from another person taking care of personal grooming?: A Little Help from another person toileting, which includes using toliet, bedpan, or urinal?: Total Help from another person bathing (including washing, rinsing, drying)?: A Lot Help from another person to put on and taking off regular upper body clothing?: A Lot Help from another person to put on and taking off regular lower body clothing?: Total 6 Click Score: 12    End of Session Equipment Utilized During Treatment: Oxygen  OT Visit Diagnosis: Unsteadiness on feet (R26.81);Repeated falls (R29.6);Muscle weakness (generalized) (M62.81);History of falling (Z91.81);Pain Pain - part of body: (lower abdomen)   Activity Tolerance Patient tolerated treatment well   Patient Left in chair;with call bell/phone within reach;with chair alarm set   Nurse Communication Mobility status;Need for lift equipment        Time: 1341-1422 OT Time Calculation (min): 41 min  Charges: OT General Charges $OT Visit: 1 Visit OT Treatments $Self Care/Home Management : 38-52 mins(41 mins)     Joanell Cressler OTR/L 10/09/2017, 2:32 PM

## 2017-10-09 NOTE — Care Management Important Message (Signed)
Important Message  Patient Details  Name: Samantha [name] MRN: [id number] Date of Birth: [birth year]   Medicare Important Message Given:  No  Precaution in place patient did not sign  Keno Caraway Stefan ChurchBratton 10/09/2017, 3:40 PM

## 2017-10-09 NOTE — Progress Notes (Signed)
Physical Therapy Treatment Patient Details Name: Samantha [name] MRN: [id number] DOB: 1945/09/11 Today's Date: 10/09/2017    History of Present Illness 72 y.o.BF PMHx Dementia, ESRD on HD M/W/F  Hypothyroidism, pancreatitis, hepatitis B, diabetes type II uncontrolled with complication, GI bleed, nephrolithiasis, L humerus fx with ORIF; REverse total shoulder,  HTN,recent fall, and decline in her physical and cognitive status over the past 2 weeks. Pt with acute encephalopathy and R parietal CVA.     PT Comments    Pt asleep upon entry, awakens to gentle tactile/aural stimulation. Pt is calm, pleasant, interactive, but hypoverbal. Pt is assisted with donning of gown as she is undressed and partially covered with blanket upon entry. Noted LUE pain with PROM required for donning gown. Pt following cues for dressing, but still requires physical assistance to move arms fully. Bed level exercises are initiated BLE with participation that requires frequent and heavy verbal cues as patient has difficulty attending to task. Session is ended once pt is noted to have loose stool in bed. Nursing notified to assist as able. Pt progressing well, but remains very weak. Cognition remains quite impaired, but RN reports this to be suspected baseline.   Follow Up Recommendations  SNF;Supervision/Assistance - 24 hour     Equipment Recommendations       Recommendations for Other Services       Precautions / Restrictions Precautions Precautions: Fall Precaution Comments: at risk for skin breakdown Restrictions Weight Bearing Restrictions: No    Mobility  Bed Mobility Overal bed mobility: Needs Assistance             General bed mobility comments: Total assist + 2 for straighteing trunk in bed.   Transfers                    Ambulation/Gait                 Stairs             Wheelchair Mobility    Modified Rankin (Stroke Patients Only)       Balance                                            Cognition Arousal/Alertness: Awake/alert Behavior During Therapy: WFL for tasks assessed/performed Overall Cognitive Status: History of cognitive impairments - at baseline                                 General Comments: interactive, impaired expressive language, follows simple commands with heavy verbal cuing       Exercises General Exercises - Lower Extremity Heel Slides: AAROM;Supine;Both;Strengthening;15 reps Hip ABduction/ADduction: AAROM;Strengthening;Both;15 reps;Supine Mini-Sqauts: Supine;AROM;Strengthening;Both;15 reps    General Comments        Pertinent Vitals/Pain Pain Assessment: Faces Faces Pain Scale: Hurts even more Pain Location: LUE with passive movement Pain Descriptors / Indicators: Grimacing;Guarding;Moaning Pain Intervention(s): Limited activity within patient's tolerance;Monitored during session;Repositioned    Home Living                      Prior Function            PT Goals (current goals can now be found in the care plan section) Acute Rehab PT Goals Patient Stated Goal: none stated PT Goal Formulation: Patient unable to  participate in goal setting    Frequency    Min 2X/week      PT Plan Current plan remains appropriate    Co-evaluation              AM-PAC PT "6 Clicks" Daily Activity  Outcome Measure  Difficulty turning over in bed (including adjusting bedclothes, sheets and blankets)?: Unable Difficulty moving from lying on back to sitting on the side of the bed? : Unable Difficulty sitting down on and standing up from a chair with arms (e.g., wheelchair, bedside commode, etc,.)?: Unable Help needed moving to and from a bed to chair (including a wheelchair)?: Total Help needed walking in hospital room?: Total Help needed climbing 3-5 steps with a railing? : Total 6 Click Score: 6    End of Session Equipment Utilized During Treatment: Development worker, international aid) Activity Tolerance: Patient limited by pain Patient left: in bed;with call bell/phone within reach;with bed alarm set(lunch tray presented) Nurse Communication: Mobility status PT Visit Diagnosis: Muscle weakness (generalized) (M62.81);Adult, failure to thrive (R62.7);Other abnormalities of gait and mobility (R26.89)     Time: 1610-9604 PT Time Calculation (min) (ACUTE ONLY): 12 min  Charges:  $Therapeutic Exercise: 8-22 mins                    G Codes:       11:58 AM, 2017-11-05 Rosamaria Lints, PT, DPT Physical Therapist - [place] [phone] (Pager)  [phone] (Office)       Omayra Tulloch C 11-05-17, 11:54 AM

## 2017-10-09 NOTE — Progress Notes (Signed)
[facility] Progress Note   Subjective: Confused, difficult to understand pt most of time  Objective Vitals:   10/08/17 2346 10/09/17 0434 10/09/17 0500 10/09/17 0800  BP: 113/71 (!) 127/48  113/70  Pulse: 99 96  100  Resp: 16 18    Temp: 98 F (36.7 C) 98.3 F (36.8 C)  97.8 F (36.6 C)  TempSrc: Oral Oral  Oral  SpO2: 100% 96%  100%  Weight:   58.9 kg (129 lb 13.6 oz)   Height:       Physical Exam General: Chronically ill appearing female, mumbling. NAD Heart: S1,S2, 2/6 systolc M Lungs: CTAB anteriorly Abdomen: S,NT. Incontinent of stool.  Extremities: No edema today-has fat ankles.  Dialysis Access: RIJ TDC blood lines connected.   Additional Objective Labs: Basic Metabolic Panel: Recent Labs  Lab 10/04/17 1320 10/05/17 1242 10/08/17 1202  NA 134* 134* 135  K 3.8 3.6 3.9  CL 95* 95* 94*  CO2 30 28 27   GLUCOSE 98 112* 100*  BUN 20 24* 28*  CREATININE 4.34* 5.52* 5.71*  CALCIUM 8.2* 8.6* 9.0  PHOS 2.4* 3.0 3.3   Liver Function Tests: Recent Labs  Lab 10/02/17 2032 10/03/17 0617 10/05/17 1242 10/08/17 1202  AST 53* 42*  --  39  ALT 27 24  --  23  ALKPHOS 118 110  --  102  BILITOT 1.8* 2.1*  --  2.0*  PROT 8.0 7.5  --  7.6  ALBUMIN 2.0* 1.9* 1.7* 2.1*   No results for input(s): LIPASE, AMYLASE in the last 168 hours. CBC: Recent Labs  Lab 10/02/17 2032  10/03/17 1218 10/04/17 1320 10/05/17 0900 10/08/17 1202  WBC 7.2  --  6.0 5.3 6.1 5.0  NEUTROABS 5.4  --  4.3  --   --   --   HGB 10.1*  --  8.6* 7.7* 8.2* 7.8*  HCT 34.6*   < > 28.3* 25.4* 27.2* 26.6*  MCV 107.8*  --  104.4* 103.7* 105.4* 108.1*  PLT 123*  --  127* 107* 142* 135*   < > = values in this interval not displayed.   Blood Culture    Component Value Date/Time   SDES BLOOD RIGHT HAND 10/03/2017 0820   SPECREQUEST  10/03/2017 0820    BOTTLES DRAWN AEROBIC AND ANAEROBIC Blood Culture adequate volume   CULT  10/03/2017 0820    NO GROWTH 5 DAYS Performed at  [facility], [address] [place], [place] [address]    REPTSTATUS 10/08/2017 FINAL 10/03/2017 0820    Cardiac Enzymes: No results for input(s): CKTOTAL, CKMB, CKMBINDEX, TROPONINI in the last 168 hours. CBG: Recent Labs  Lab 10/08/17 0031 10/08/17 0338 10/08/17 1156 10/08/17 2127 10/09/17 0612  GLUCAP 115* 96 79 82 72   Iron Studies: No results for input(s): IRON, TIBC, TRANSFERRIN, FERRITIN in the last 72 hours. @lablastinr3 @ Studies/Results: No results found. Medications:  . aspirin EC  81 mg Oral Daily  . Chlorhexidine Gluconate Cloth  6 each Topical Q0600  . Chlorhexidine Gluconate Cloth  6 each Topical Q0600  . darbepoetin (ARANESP) injection - DIALYSIS  100 mcg Intravenous Q Mon-HD  . feeding supplement (NEPRO CARB STEADY)  237 mL Oral BID BM  . feeding supplement (PRO-STAT SUGAR FREE 64)  30 mL Oral BID  . lactulose  20 g Oral BID  . levothyroxine  50 mcg Oral QAC breakfast  . midodrine  5 mg Oral TID WC  . senna  1 tablet Oral QHS  .  sodium chloride flush  3 mL Intravenous Q12H  . thiamine  100 mg Oral Daily     [facility], 400/800, 3K/2.25Ca, EDW 59.5kg, UF #2/linear Na, TDC, no heparin. - Mircera 225mcg IV q 2 weeks (last given 09/24/17) - No VDRA/venofer.  Assessment/Plan: 1. RUE:AVWUJWAMS:mostly chronic decline w/ FTT 2. ESRD - usual HD mwf. Will hold HD for now while palliative care discussions are underway.  3. Hypotension   She continues on midodrine  4. Anemiaof CKD - getting esa here 5. Metabolic bone disease:Ca ok, no binders for now. Follow. 6. Nutrition:Alb 1.9, very low in setting of not eating very well  7. Hx alcoholic cirrhosis: Ammonia ^, continue lactulose. 8. Hypothyroidism: TSH high, per primary. Synthroid 50 mcg  Daily  9. Type 2 DM: Hypoglycemic at times, per primary. 10. Dispo: family meeting w/ pall care and leaning towards comfort care. Will hold off on dialysis for now. Do not anticipate any further  HD.   Vinson Moselleob Lamario Mani MD [facility] pager [phone]   10/09/2017, 11:53 AM

## 2017-10-09 NOTE — Care Management Note (Signed)
Case Management Note  Patient Details  Name: Samantha [name] MRN: [id number] Date of Birth: 07/12/1945  Subjective/Objective:                    Action/Plan: [facility] CSW aware. CM following.  Expected Discharge Date:                  Expected Discharge Plan:  Hospice Medical Facility  In-House Referral:  Clinical Social Work  Discharge planning Services     Post Acute Care Choice:    Choice offered to:     DME Arranged:    DME Agency:     HH Arranged:    HH Agency:     Status of Service:  In process, will continue to follow  If discussed at Long Length of Stay Meetings, dates discussed:    Additional Comments:  Kermit BaloKelli F Gladyce Mcray, RN 10/09/2017, 4:19 PM

## 2017-10-09 NOTE — Progress Notes (Addendum)
PROGRESS NOTE    Samantha [name]  NFA:[id number] DOB: 11/12/1945 DOA: 10/02/2017 PCP: Samantha Contras, MD      Brief Narrative:  Samantha [name] is a 72 y.o. 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  Patient lives with her grandson who reports that she fell approximately 2 weeks ago, was evaluated in the emergency department with negative radiographs at that time, but has been bedbound since then, complaining of hip and pelvic pain.  Patient has been eating and drinking very little, essentially only taking Ensure for the past couple weeks and has been bedbound over this interval.    CT head on admission showed possible stroke, but follow up MRI showed this was chronic.  Her metabolic encephalopathy then persisted, despite dialysis, with difficulty differentiating a reversible cause.       Assessment & Plan:  Acute metabolic encephalopathy Neurology consulted.  There was initial concern that she had HE or infection, or was underdialyzed but she was treated for HE, dialyzed, had no improvement.  B12 normal.  RPR negative.  Cortisol normal. MRI brain shows only old stroke.  This appears to be progression of her vascular dementia.  Neurology, Nephrology and I do not believe there is a reversible cause of her current encephalopathy.  If she had an underlying malignancy causing her encephalopathy, she would not be a candidate for surgery or chemo.    Had family meeting with grandson and granddaughter today.  They recognize her progressive decline in last months, now to the point of mostly refusing food, being wheelchair bound, only intermittently being able to communicate.  We disussed her underlying CHF, liver failure, renal failure and now brain failure.  We discussed our dwindling therapeutic options in that setting, her apparent disease trajectory.   They have elected to transition to comfort cares and Hospice.      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 -Neurology consulted, no further work up at this time  Cirrhosis Hepatic encephalopathy Hypotension Lactulose failed.   -Hold midodrine  Hypothyroidism -Hold levothyroxine  ESRD on HD MWF -Consult to Palliative care  Hyperkalemia Corrected  Stool occult blood No clinical bleeding has been appreciated  Diabetes A1c 4% -Stop Accuchecks  Anemia of chronic renal disease Hgb no change  Hip pain -Palliation with hydromorphone as needed  Stage IV sacral ulcer, present on admission       DVT prophylaxis: SCDs Code Status: DO NOT RESUSCITATE Family Communication: Called and left VM for son, talked to granddaughter. MDM and disposition Plan: The labs and imaging reports reviewed and summarized above.  Patient was admitted with encephalopathy.  This is not improved at all with treatment of hepatic encephalopathy, dialysis.  There is no apparent underlying infection.  In the setting of her renal failure, liver failure, heart failure, and now severe encephalopathy, we recommend hospice care.    Hospice will evaluate tomorrow, to [facility] when bed available.       Consultants:   Neurology  Palliative Care  Nephrology  Procedures:   None  Antimicrobials:       Subjective: No new fever, respiratory distress, vomiting.  Still with frequent stools, sacral ulcer.  She is agitated when I come by, again today.     Objective: Vitals:   10/09/17 0500 10/09/17 0800 10/09/17 1220 10/09/17 1524  BP:  113/70 105/61 93/69  Pulse:  100 90 89  Resp:   20 16  Temp:  97.8 F (36.6 C) (!) 97.3 F (36.3 C) 97.7 F (36.5 C)  TempSrc:  Oral Oral Axillary  SpO2:  100% 99% 100%  Weight: 58.9 kg (129 lb 13.6 oz)     Height:        Intake/Output Summary (Last 24 hours) at 10/09/2017 1806 Last data filed at 10/09/2017 1600 Gross per 24 hour  Intake 490 ml    Output 2202 ml  Net -1712 ml   Filed Weights   10/08/17 1350 10/08/17 1751 10/09/17 0500  Weight: 58.1 kg (128 lb 1.4 oz) 56.1 kg (123 lb 10.9 oz) 58.9 kg (129 lb 13.6 oz)    Examination: General appearance: Elderly female, sitting in a chair, unable to communicate.  Makes eye contact. HEENT: Anicteric, conjunctive are pink, lids and lashes normal.  No nasal deformity, discharge, or epistaxis.  Lips and cheeks seem puffy, edentulous, oropharynx dry, no oral lesions. Skin: Skin warm and dry, bruising noted on the arms, no suspicious rashes or lesions, sacral ulcer not examined. Cardiac: Rate and rhythm, no lower extremity edema Respiratory: Respirations easy and unlabored, lungs clear without rales or wheezes. Abdomen: Abdomen soft without tenderness to palpation or guarding. MSK: Difficult to assess, no obvious deformities or effusions of the large joints of the upper or lower extremities bilaterally. Neuro: Awake and makes eye contact, but very confused.  Dysarthric.    Psych: Perseverating, otherwise unable to assess.       Data Reviewed: I have personally reviewed following labs and imaging studies:  CBC: Recent Labs  Lab 10/02/17 2032 10/02/17 2223 10/03/17 1218 10/04/17 1320 10/05/17 0900 10/08/17 1202  WBC 7.2  --  6.0 5.3 6.1 5.0  NEUTROABS 5.4  --  4.3  --   --   --   HGB 10.1*  --  8.6* 7.7* 8.2* 7.8*  HCT 34.6* 28.8* 28.3* 25.4* 27.2* 26.6*  MCV 107.8*  --  104.4* 103.7* 105.4* 108.1*  PLT 123*  --  127* 107* 142* 135*   Basic Metabolic Panel: Recent Labs  Lab 10/02/17 2032 10/03/17 0617 10/03/17 1218 10/03/17 2020 10/04/17 1320 10/05/17 1242 10/08/17 1202  NA 139 138  --   --  134* 134* 135  K 5.3* 4.8 4.8 4.7 3.8 3.6 3.9  CL 99* 98*  --   --  95* 95* 94*  CO2 24 26  --   --  30 28 27   GLUCOSE 84 122*  --   --  98 112* 100*  BUN 52* 55*  --   --  20 24* 28*  CREATININE 7.67* 7.96*  --   --  4.34* 5.52* 5.71*  CALCIUM 9.1 9.4  --   --  8.2* 8.6* 9.0   MG  --   --   --   --  2.0 2.3  --   PHOS  --   --   --   --  2.4* 3.0 3.3   GFR: Estimated Creatinine Clearance: 7.6 mL/min (A) (by C-G formula based on SCr of 5.71 mg/dL (H)). Liver Function Tests: Recent Labs  Lab 10/02/17 2032 10/03/17 0617 10/05/17 1242 10/08/17 1202  AST 53* 42*  --  39  ALT 27 24  --  23  ALKPHOS 118 110  --  102  BILITOT 1.8* 2.1*  --  2.0*  PROT 8.0 7.5  --  7.6  ALBUMIN 2.0* 1.9* 1.7* 2.1*   No results  for input(s): LIPASE, AMYLASE in the last 168 hours. Recent Labs  Lab 10/02/17 2032  AMMONIA 59*   Coagulation Profile: No results for input(s): INR, PROTIME in the last 168 hours. Cardiac Enzymes: No results for input(s): CKTOTAL, CKMB, CKMBINDEX, TROPONINI in the last 168 hours. BNP (last 3 results) No results for input(s): PROBNP in the last 8760 hours. HbA1C: No results for input(s): HGBA1C in the last 72 hours. CBG: Recent Labs  Lab 10/08/17 0031 10/08/17 0338 10/08/17 1156 10/08/17 2127 10/09/17 0612  GLUCAP 115* 96 79 82 72   Lipid Profile: No results for input(s): CHOL, HDL, LDLCALC, TRIG, CHOLHDL, LDLDIRECT in the last 72 hours. Thyroid Function Tests: No results for input(s): TSH, T4TOTAL, FREET4, T3FREE, THYROIDAB in the last 72 hours. Anemia Panel: No results for input(s): VITAMINB12, FOLATE, FERRITIN, TIBC, IRON, RETICCTPCT in the last 72 hours. Urine analysis:    Component Value Date/Time   COLORURINE AMBER (A) 08/03/2014 1249   APPEARANCEUR CLOUDY (A) 08/03/2014 1249   LABSPEC 1.020 08/03/2014 1249   PHURINE 7.0 08/03/2014 1249   GLUCOSEU 500 (A) 08/03/2014 1249   HGBUR LARGE (A) 08/03/2014 1249   BILIRUBINUR MODERATE (A) 08/03/2014 1249   KETONESUR 15 (A) 08/03/2014 1249   PROTEINUR >300 (A) 08/03/2014 1249   UROBILINOGEN 4.0 (H) 08/03/2014 1249   NITRITE POSITIVE (A) 08/03/2014 1249   LEUKOCYTESUR TRACE (A) 08/03/2014 1249   Sepsis Labs: @LABRCNTIP (procalcitonin:4,lacticacidven:4)  ) Recent Results (from  the past 240 hour(s))  Blood Culture (routine x 2)     Status: None   Collection Time: 10/03/17  8:20 AM  Result Value Ref Range Status   Specimen Description BLOOD RIGHT HAND  Final   Special Requests   Final    BOTTLES DRAWN AEROBIC AND ANAEROBIC Blood Culture adequate volume   Culture   Final    NO GROWTH 5 DAYS Performed at [facility], [address], [place], [place] [address]    Report Status 10/08/2017 FINAL  Final         Radiology Studies: No results found.      Scheduled Meds: . Chlorhexidine Gluconate Cloth  6 each Topical Q0600  . Chlorhexidine Gluconate Cloth  6 each Topical Q0600  . feeding supplement (NEPRO CARB STEADY)  237 mL Oral BID BM  . [START ON 10/10/2017] lactulose  20 g Oral Daily  . sodium chloride flush  3 mL Intravenous Q12H  . sodium chloride flush  3 mL Intravenous Q12H   Continuous Infusions: . sodium chloride       LOS: 7 days    Time spent: 35 minutes was spent with the patient, face to face or on the floor, with greater than 50% of total time spent examining patient, counseling patient and/or family regarding disease process, end of life care, therapeutic options, and coordinating care.     Alberteen Sam, MD [facility] 10/09/2017, 6:06 PM     Pager [phone] --- please page though AMION:  [email or web address] If 7PM-7AM, please contact night-coverage

## 2017-10-10 DIAGNOSIS — W19XXXD Unspecified fall, subsequent encounter: Secondary | ICD-10-CM

## 2017-10-10 MED ORDER — MORPHINE SULFATE 10 MG/5ML PO SOLN: 10.0000 mg | ORAL | 0 refills | Status: AC | PRN

## 2017-10-10 MED ORDER — HYDROMORPHONE HCL 1 MG/ML IJ SOLN: 0.5000 mg | INTRAMUSCULAR | 0 refills | Status: AC | PRN

## 2017-10-10 MED ORDER — GLYCOPYRROLATE 1 MG PO TABS: 1.0000 mg | ORAL_TABLET | ORAL | Status: AC | PRN

## 2017-10-10 MED ORDER — ACETAMINOPHEN 325 MG PO TABS: 650.0000 mg | ORAL_TABLET | Freq: Four times a day (QID) | ORAL | Status: AC | PRN

## 2017-10-10 MED ORDER — HALOPERIDOL 0.5 MG PO TABS: 0.5000 mg | ORAL_TABLET | ORAL | Status: AC | PRN

## 2017-10-10 MED ORDER — ONDANSETRON HCL 4 MG PO TABS: 4.0000 mg | ORAL_TABLET | Freq: Four times a day (QID) | ORAL | 0 refills | Status: AC | PRN

## 2017-10-10 MED ORDER — POLYVINYL ALCOHOL 1.4 % OP SOLN: 1.0000 [drp] | Freq: Four times a day (QID) | OPHTHALMIC | 0 refills | Status: AC | PRN

## 2017-10-10 MED ORDER — BIOTENE DRY MOUTH MT LIQD: 15.0000 mL | OROMUCOSAL | Status: AC | PRN

## 2017-10-10 MED ORDER — MORPHINE SULFATE 10 MG/5ML PO SOLN
10.0000 mg | ORAL | Status: DC | PRN
Start: 2017-10-10 — End: 2017-10-10
  Administered 2017-10-10: 10 mg via SUBLINGUAL
  Filled 2017-10-10: qty 6

## 2017-10-10 NOTE — Final Progress Note (Signed)
NURSING PROGRESS NOTE  Samantha [name] [id number] Discharge Data: 10/10/2017 1:35 PM Attending Provider: Laverna PeaceNettey, Shayla D, MD WUJ:[id number]:Avbuere, Dorma RussellEdwin, MD     Samantha HockingPerlie M Granda [facility] order.  Report called and [facility]. Last Vital Signs:  Blood pressure (!) 100/57, pulse 86, temperature 97.6 F (36.4 C), temperature source Oral, resp. rate 16, height 5\' 4"  (1.626 m), weight 58.9 kg (129 lb 13.6 oz), SpO2 96 %.  Discharge Medication List Allergies as of 10/10/2017      Reactions   Aspirin Other (See Comments)   Per Md.   Banana Other (See Comments)   Due to dialysis   Chocolate Other (See Comments)   Due to dialysis   Fish-derived Products Other (See Comments)   Due to gout      Medication List    STOP taking these medications   acetaminophen 650 MG CR tablet Commonly known as:  TYLENOL Replaced by:  acetaminophen 325 MG tablet   dicyclomine 20 MG tablet Commonly known as:  BENTYL   levothyroxine 50 MCG tablet Commonly known as:  SYNTHROID, LEVOTHROID   MULTIVITAMIN ADULT PO   pantoprazole 40 MG tablet Commonly known as:  PROTONIX     TAKE these medications   acetaminophen 325 MG tablet Commonly known as:  TYLENOL Take 2 tablets (650 mg total) by mouth every 6 (six) hours as needed for mild pain (or Fever >/= 101). Replaces:  acetaminophen 650 MG CR tablet   antiseptic oral rinse Liqd Apply 15 mLs topically as needed for dry mouth.   glycopyrrolate 1 MG tablet Commonly known as:  ROBINUL Take 1 tablet (1 mg total) by mouth every 4 (four) hours as needed (excessive secretions).   haloperidol 0.5 MG tablet Commonly known as:  HALDOL Take 1 tablet (0.5 mg total) by mouth every 4 (four) hours as needed for agitation (or delirium).   HYDROmorphone 1 MG/ML injection Commonly known as:  DILAUDID Inject 0.5-1 mLs (0.5-1 mg total) into the vein every 2 (two) hours as needed for severe pain (or dyspnea).   morphine 10 MG/5ML  solution Place 5 mLs (10 mg total) under the tongue every 2 (two) hours as needed for severe pain.   ondansetron 4 MG tablet Commonly known as:  ZOFRAN Take 1 tablet (4 mg total) by mouth every 6 (six) hours as needed for nausea.   polyvinyl alcohol 1.4 % ophthalmic solution Commonly known as:  LIQUIFILM TEARS Place 1 drop into both eyes 4 (four) times daily as needed for dry eyes.

## 2017-10-10 NOTE — Care Management Note (Signed)
Case Management Note  Patient Details  Name: Pervis Hockingerlie M Dumm MRN: [id number] Date of Birth: 04/22/1945  Subjective/Objective:                    Action/Plan: [facility]. CM signing off.   Expected Discharge Date:                  Expected Discharge Plan:  Hospice Medical Facility  In-House Referral:  Clinical Social Work  Discharge planning Services     Post Acute Care Choice:    Choice offered to:     DME Arranged:    DME Agency:     HH Arranged:    HH Agency:     Status of Service:  Completed, signed off  If discussed at [employer] Length of [employer] Meetings, dates discussed:    Additional Comments:  Kermit BaloKelli F Eliceo Gladu, RN 10/10/2017, 11:21 AM

## 2017-10-10 NOTE — Progress Notes (Signed)
[facility] Anticipated discharge date: 6/26 Family notified: at bedside Transportation by [facility]- called at 12:43pm  CSW signing off.  Burna SisJenna H. Amil Bouwman, LCSW Clinical Social Worker [phone]

## 2017-10-10 NOTE — Progress Notes (Signed)
Reviewed chart.  Examined patient.  She appears comfortable and content.  Adjusted her positioning and TV channel.  Patient to go to [facility] today.    PMT is very grateful to Forrestine HimEva Davis, [facility].  Norvel RichardsMarianne Chrysten Woulfe, PA-C Palliative Medicine Pager: [phone]   No charge note.

## 2017-10-10 NOTE — Progress Notes (Signed)
Notes that patient is now full comfort care. Will sign off.   Samantha Moselleob Samantha Baby MD [facility] 10/10/2017, 11:18 AM

## 2017-10-10 NOTE — Progress Notes (Signed)
[facility] available today for patient. Plan to meet g-dtr [place] at noon to complete paperwork for transfer today.   Will need DC summary sent to [phone].  RN please call report to [phone].  Thank you,  Forrestine Himva Davis, LCSW [phone]

## 2017-10-10 NOTE — Discharge Summary (Signed)
Discharge Summary  Samantha [name] GUY:[id number] DOB: July 13, 1945  PCP: Fleet Contras, MD  Admit date: 10/02/2017 Discharge date: 10/10/17   Time spent: < 25 minutes  Admitted From: Home Disposition:  Residential Hospice   Discharge Diagnoses:  Active Hospital Problems   Diagnosis Date Noted  . Hyperkalemia 10/02/2017  . Comfort measures only status   . Encounter for hospice care discussion   . Vascular dementia with behavior disturbance   . Palliative care encounter   . Stroke (cerebrum) (HCC) 10/02/2017  . Hypothyroidism 10/02/2017  . Hip pain 10/02/2017  . Hypoglycemia without diagnosis of diabetes mellitus 10/02/2017  . Acute encephalopathy 06/14/2015  . ESRD on hemodialysis (HCC) 06/06/2011  . Anemia in chronic kidney disease (CKD) 06/06/2011    Resolved Hospital Problems  No resolved problems to display.    Discharge Condition: Hospice  CODE STATUS:Comfort Care Diet recommendation: Regular  Vitals:   10/09/17 2329 10/10/17 0750  BP: 125/64 (!) 100/57  Pulse: 86 86  Resp: 16 16  Temp: 98 F (36.7 C) 97.6 F (36.4 C)  SpO2: 96%     History of present illness:  Samantha [name] is a 72 y.o. year old female with medical history significant for dementia, end-stage renal disease on hemodialysis, hypothyroidism, fall 2 weeks ago and now bedbound due to hip and pelvic pain who presented on 10/02/2017 after being found down by family with reports of minimal appetite and missing last dialysis session due to confusion and was found to have acute metabolic encephalopathy. Remaining hospital course addressed in problem based format below:   Hospital Course:  Principal Problem:   Hyperkalemia Active Problems:   ESRD on hemodialysis (HCC)   Anemia in chronic kidney disease (CKD)   Acute encephalopathy   Stroke (cerebrum) (HCC)   Hypothyroidism   Hip pain   Hypoglycemia without diagnosis of diabetes mellitus   Vascular dementia with behavior disturbance  Palliative care encounter   Comfort measures only status   Encounter for hospice care discussion  1. Acute metabolic encephalopathy. Concern for progression of her dementia with worsening hospital acquired delirium. Her ammonia was slightly elevated here but even with increased stool from lactulose this did not improve her mentation.  Other acute etiologies ruled out with wnl B12, negative RPR, normal cortisol, chronic infarct on MRI brain, elevated TSH but normal T4. Neurology and Nephrology agreed no identifiable reversible cause of confusion and likely progresion of her vascular dementia. Given her poor appetite/minimal oral intake as signs of end-stage dementia in addition to chronic medical problems with help from Palliative consultants family elected to transition to comfort care. Patient was accepted [facility] for hospice care.    2. Cirrhosis. Some concern for hepatic encephalopathy initially; however, ammonia slightly elevated74, no improvement with lactulose therapy. Will not continue regimen on discharge  3. Chronic right parietal lobe CVA. Identified on MRI brain/MRA neck. Neurology agreed multifactorial etiology for confusion, likely metabolic and not ischemic stroke.    4. Hypothyroidism. TSH elevated to 24. Free T4 1.18. Given increased confusion very likely elevated in setting of poor adherence to levothyroxine. Also consider TSH elevated in setting of euthyroid sick syndrome (though technically not critically ill).   5. ESRD on HD MWF. HD during hospitalization. Was stopped once transitioned to comfort care on 10/09/17  6. Recent fall and Hip pain. No fracture on XR. Pain control with hydromorphone as needed. Sublingual liquid morphine q2H PRN provided since patient without IV access, IV dilaudid is acceptable (with discontinuation of  SL morphine) as well once IV access obtained  Antibiotics: Zosyn: 6/18-6/19 Vancomycin: 6/18-6/19  Microbiology: Blood( 6/19): No  growth  Consultations:  Nephrology, neurology, Palliative medicine   Procedures/Studies: TTE 10/03/17:  Impressions:  - Normal LV size with mild LV hypertrophy. EF 55-60%. Moderate   diastolic dysfunction. Normal RV size and systolic function.   Severe mitral regurgitation with calcified and restricted   posterior leaflet. Severe left atrial enlargement. Mild pulmonary  Carotid Duplex bilateral 10/03/17  Final Interpretation: Right Carotid: Velocities in the right ICA are consistent with a 1-39% stenosis.  Left Carotid: Velocities in the left ICA are consistent with a 1-39% stenosis.  Vertebrals: Bilateral vertebral arteries demonstrate antegrade flow. Subclavians: Bilateral subclavian arteries were not visualized.    Dg Chest 2 View  Result Date: 10/02/2017 CLINICAL DATA:  Patient found on floor. Decreased urinary output. Patient missed dialysis on Monday. EXAM: CHEST - 2 VIEW COMPARISON:  06/26/2017 FINDINGS: Stable cardiomegaly with aortic atherosclerosis. No aneurysm. Right IJ dialysis catheter tip terminates in the right atrium. No overt pulmonary edema, pulmonary consolidation or effusion. Minimal atelectasis at the left lung base. Partially included bilateral shoulder arthroplasties are redemonstrated without significant change. IMPRESSION: Stable mild cardiomegaly and aortic atherosclerosis. No active pulmonary disease. Electronically Signed   By: Tollie Eth M.D.   On: 10/02/2017 20:24   Ct Head Wo Contrast  Result Date: 10/02/2017 CLINICAL DATA:  Altered level of consciousness, patient has not been eating but only drinking Ensure. Decreased urinary output. EXAM: CT HEAD WITHOUT CONTRAST TECHNIQUE: Contiguous axial images were obtained from the base of the skull through the vertex without intravenous contrast. COMPARISON:  08/18/2016 FINDINGS: Brain: New confluent area of hypodensity involving the right high parietal lobe suspicious for recent infarct. No hemorrhagic  component is identified. Stable mild superficial and moderate central atrophy is noted. No effacement of the basal cisterns or fourth ventricle. No intra-axial mass nor extra-axial fluid collections. Chronic small vessel ischemic disease of periventricular subcortical white matter. Vascular: Moderate atherosclerosis of the carotid siphons. Skull: No skull fracture or suspicious osseous lesions. Sinuses/Orbits: No acute paranasal sinus disease. Intact orbits. Bilateral lens replacements. Other: Small left mastoid effusion with partial opacification of the left mastoid. Osteoarthritis of the included TMJ though assessment is limited due to motion artifacts. IMPRESSION: 1. New area of hypodensity in the right parietal lobe suspicious for a recent nonhemorrhagic infarct. 2. Chronic stable atrophy with small vessel ischemia. 3. Small left mastoid effusion. Electronically Signed   By: Tollie Eth M.D.   On: 10/02/2017 20:53   Mr Maxine Glenn Neck Wo Contrast  Result Date: 10/03/2017 CLINICAL DATA:  Initial evaluation for acute altered mental status. EXAM: MRI HEAD WITHOUT CONTRAST MRA NECK WITHOUT CONTRAST TECHNIQUE: Multiplanar, multiecho pulse sequences of the brain and surrounding structures were obtained without intravenous contrast. COMPARISON:  Prior CT from 10/02/2017 FINDINGS: MRI BRAIN FINDINGS Brain: Study severely degraded by motion artifact, limiting evaluation. Generalized age-related cerebral atrophy. Chronic microvascular ischemic changes grossly stable. Encephalomalacia within the right parietal lobe consistent with remote right posterior MCA territory infarct. This corresponds with abnormality seen on prior CT. No definite abnormal foci of restricted diffusion to suggest acute or subacute ischemia. Gray-white matter differentiation otherwise grossly maintained. Additional small remote right cerebellar infarct noted. No obvious intracranial hemorrhage. No appreciable mass lesion. No mass effect or midline  shift. Diffuse ventricular prominence similar to previous examinations. Appreciable extra-axial fluid collection. Vascular: Major intracranial vascular flow voids grossly maintained at the skull base. Skull and upper  cervical spine: Craniocervical junction poorly evaluated on this motion degraded exam. Bone marrow signal intensity grossly within normal limits. No obvious focal osseous lesion. No scalp soft tissue abnormality. Sinuses/Orbits: Globes and orbital soft tissues demonstrate no definite acute abnormality. Patient status post lens extraction bilaterally. Paranasal sinuses are grossly clear. Left mastoid effusion noted. Other: None. MRA NECK FINDINGS Study severely limited by extensive motion artifact and lack of IV contrast. Aortic arch and origin of the great vessels not assessed on this exam. Common and internal carotid arteries grossly patent within the neck. Although note definite high-grade stenosis identified, evaluation fairly limited visualized vertebral arteries grossly patent with antegrade flow. Evaluation for possible vertebral artery stenosis fairly limited. IMPRESSION: MRI HEAD IMPRESSION 1. Severely limited exam due to extensive motion artifact. 2. No definite acute intracranial abnormality. Previously noted right parietal abnormality is consistent with a chronic right posterior MCA infarct. 3. Grossly stable atrophy with chronic small vessel ischemic. MRA NECK IMPRESSION 1. Severely limited and nearly nondiagnostic examination to extensive motion artifact and lack of IV contrast. 2. Major arterial vasculature of the neck grossly patent without occlusion. Evaluation for discrete stenosis limited on this exam. Electronically Signed   By: Rise Mu M.D.   On: 10/03/2017 22:09   Mr Brain Wo Contrast  Result Date: 10/03/2017 CLINICAL DATA:  Initial evaluation for acute altered mental status. EXAM: MRI HEAD WITHOUT CONTRAST MRA NECK WITHOUT CONTRAST TECHNIQUE: Multiplanar, multiecho  pulse sequences of the brain and surrounding structures were obtained without intravenous contrast. COMPARISON:  Prior CT from 10/02/2017 FINDINGS: MRI BRAIN FINDINGS Brain: Study severely degraded by motion artifact, limiting evaluation. Generalized age-related cerebral atrophy. Chronic microvascular ischemic changes grossly stable. Encephalomalacia within the right parietal lobe consistent with remote right posterior MCA territory infarct. This corresponds with abnormality seen on prior CT. No definite abnormal foci of restricted diffusion to suggest acute or subacute ischemia. Gray-white matter differentiation otherwise grossly maintained. Additional small remote right cerebellar infarct noted. No obvious intracranial hemorrhage. No appreciable mass lesion. No mass effect or midline shift. Diffuse ventricular prominence similar to previous examinations. Appreciable extra-axial fluid collection. Vascular: Major intracranial vascular flow voids grossly maintained at the skull base. Skull and upper cervical spine: Craniocervical junction poorly evaluated on this motion degraded exam. Bone marrow signal intensity grossly within normal limits. No obvious focal osseous lesion. No scalp soft tissue abnormality. Sinuses/Orbits: Globes and orbital soft tissues demonstrate no definite acute abnormality. Patient status post lens extraction bilaterally. Paranasal sinuses are grossly clear. Left mastoid effusion noted. Other: None. MRA NECK FINDINGS Study severely limited by extensive motion artifact and lack of IV contrast. Aortic arch and origin of the great vessels not assessed on this exam. Common and internal carotid arteries grossly patent within the neck. Although note definite high-grade stenosis identified, evaluation fairly limited visualized vertebral arteries grossly patent with antegrade flow. Evaluation for possible vertebral artery stenosis fairly limited. IMPRESSION: MRI HEAD IMPRESSION 1. Severely limited  exam due to extensive motion artifact. 2. No definite acute intracranial abnormality. Previously noted right parietal abnormality is consistent with a chronic right posterior MCA infarct. 3. Grossly stable atrophy with chronic small vessel ischemic. MRA NECK IMPRESSION 1. Severely limited and nearly nondiagnostic examination to extensive motion artifact and lack of IV contrast. 2. Major arterial vasculature of the neck grossly patent without occlusion. Evaluation for discrete stenosis limited on this exam. Electronically Signed   By: Rise Mu M.D.   On: 10/03/2017 22:09   Dg Hips Bilat With Pelvis 2v  Result Date: 10/02/2017 CLINICAL DATA:  72 year old female status post fall last week, and today. Found down. EXAM: DG HIP (WITH OR WITHOUT PELVIS) 2V BILAT COMPARISON:  CT Abdomen and Pelvis 07/22/2017. FINDINGS: Portable AP and frog-leg lateral views of the pelvis. The femoral heads are normally located and hip joint spaces are normal for age. Both proximal femurs appear intact. No pelvis fracture identified. Stool ball in the rectum. Iliofemoral calcified atherosclerosis. IMPRESSION: No hip fracture or acute osseous abnormality identified. Electronically Signed   By: Odessa Fleming M.D.   On: 10/02/2017 22:59   Dg Hip Unilat W Or Wo Pelvis 2-3 Views Right  Result Date: 09/19/2017 CLINICAL DATA:  Status post fall, with right leg pain. Initial encounter. EXAM: DG HIP (WITH OR WITHOUT PELVIS) 2-3V RIGHT COMPARISON:  Right hip radiographs performed 09/11/2014 FINDINGS: There is no evidence of fracture or dislocation. Both femoral heads are seated normally within their respective acetabula. The proximal right femur appears intact. No significant degenerative change is appreciated. The sacroiliac joints are unremarkable in appearance. The visualized bowel gas pattern is grossly unremarkable in appearance. Scattered phleboliths are noted within the pelvis. IMPRESSION: No evidence of fracture or dislocation. If  the patient's symptoms persist, MRI of the right hip could be considered for further evaluation. Electronically Signed   By: Roanna Raider M.D.   On: 09/19/2017 06:15   Dg Femur Min 2 Views Right  Result Date: 09/19/2017 CLINICAL DATA:  Status post fall, with acute onset of right leg pain. Initial encounter. EXAM: RIGHT FEMUR 2 VIEWS COMPARISON:  None. FINDINGS: There is no evidence of fracture or dislocation. The right femur appears intact. The right femoral head remains seated at the acetabulum. No significant knee joint effusion is identified. There is narrowing of the medial compartment of the right knee, with underlying meniscal calcification. Scattered vascular calcifications are seen. IMPRESSION: 1. No evidence of fracture or dislocation. 2. Narrowing of the medial compartment of the right knee, with underlying meniscal calcification. 3. Scattered vascular calcifications seen. Electronically Signed   By: Roanna Raider M.D.   On: 09/19/2017 06:16     Discharge Exam: BP (!) 100/57 (BP Location: Right Arm)   Pulse 86   Temp 97.6 F (36.4 C) (Oral)   Resp 16   Ht 5\' 4"  (1.626 m)   Wt 58.9 kg (129 lb 13.6 oz)   SpO2 96%   BMI 22.29 kg/m   General: Lying in bed, no apparent distress Eyes: opens eyes to voice ENT: dry oral Mucosa Cardiovascular: regular rate and rhythm, no murmurs, rubs or gallops, no edema, Respiratory: Normal respiratory effort, lungs clear to auscultation bilaterally Abdomen: soft, non-distended, non-tender Skin: No Rash Neurologic: not alert. Not oriented to person, place or time. Not following commands   Discharge Instructions You were cared for by a hospitalist during your hospital stay. If you have any questions about your discharge medications or the care you received while you were in the hospital after you are discharged, you can call the unit and asked to speak with the hospitalist on call if the hospitalist that took care of you is not available. Once you  are discharged, your primary care physician will handle any further medical issues. Please note that NO REFILLS for any discharge medications will be authorized once you are discharged, as it is imperative that you return to your primary care physician (or establish a relationship with a primary care physician if you do not have one) for your aftercare needs so  that they can reassess your need for medications and monitor your lab values.  Discharge Instructions    Diet - low sodium heart healthy   Complete by:  As directed    Increase activity slowly   Complete by:  As directed      Allergies as of 10/10/2017      Reactions   Aspirin Other (See Comments)   Per Md.   Banana Other (See Comments)   Due to dialysis   Chocolate Other (See Comments)   Due to dialysis   Fish-derived Products Other (See Comments)   Due to gout      Medication List    STOP taking these medications   acetaminophen 650 MG CR tablet Commonly known as:  TYLENOL Replaced by:  acetaminophen 325 MG tablet   dicyclomine 20 MG tablet Commonly known as:  BENTYL   levothyroxine 50 MCG tablet Commonly known as:  SYNTHROID, LEVOTHROID   MULTIVITAMIN ADULT PO   pantoprazole 40 MG tablet Commonly known as:  PROTONIX     TAKE these medications   acetaminophen 325 MG tablet Commonly known as:  TYLENOL Take 2 tablets (650 mg total) by mouth every 6 (six) hours as needed for mild pain (or Fever >/= 101). Replaces:  acetaminophen 650 MG CR tablet   antiseptic oral rinse Liqd Apply 15 mLs topically as needed for dry mouth.   glycopyrrolate 1 MG tablet Commonly known as:  ROBINUL Take 1 tablet (1 mg total) by mouth every 4 (four) hours as needed (excessive secretions).   haloperidol 0.5 MG tablet Commonly known as:  HALDOL Take 1 tablet (0.5 mg total) by mouth every 4 (four) hours as needed for agitation (or delirium).   HYDROmorphone 1 MG/ML injection Commonly known as:  DILAUDID Inject 0.5-1 mLs (0.5-1  mg total) into the vein every 2 (two) hours as needed for severe pain (or dyspnea).   morphine 10 MG/5ML solution Place 5 mLs (10 mg total) under the tongue every 2 (two) hours as needed for severe pain.   ondansetron 4 MG tablet Commonly known as:  ZOFRAN Take 1 tablet (4 mg total) by mouth every 6 (six) hours as needed for nausea.   polyvinyl alcohol 1.4 % ophthalmic solution Commonly known as:  LIQUIFILM TEARS Place 1 drop into both eyes 4 (four) times daily as needed for dry eyes.      Allergies  Allergen Reactions  . Aspirin Other (See Comments)    Per Md.  . Banana Other (See Comments)    Due to dialysis  . Chocolate Other (See Comments)    Due to dialysis  . Fish-Derived Products Other (See Comments)    Due to gout      The results of significant diagnostics from this hospitalization (including imaging, microbiology, ancillary and laboratory) are listed below for reference.    Significant Diagnostic Studies: Dg Chest 2 View  Result Date: 10/02/2017 CLINICAL DATA:  Patient found on floor. Decreased urinary output. Patient missed dialysis on Monday. EXAM: CHEST - 2 VIEW COMPARISON:  06/26/2017 FINDINGS: Stable cardiomegaly with aortic atherosclerosis. No aneurysm. Right IJ dialysis catheter tip terminates in the right atrium. No overt pulmonary edema, pulmonary consolidation or effusion. Minimal atelectasis at the left lung base. Partially included bilateral shoulder arthroplasties are redemonstrated without significant change. IMPRESSION: Stable mild cardiomegaly and aortic atherosclerosis. No active pulmonary disease. Electronically Signed   By: Tollie Eth M.D.   On: 10/02/2017 20:24   Ct Head Wo Contrast  Result Date:  10/02/2017 CLINICAL DATA:  Altered level of consciousness, patient has not been eating but only drinking Ensure. Decreased urinary output. EXAM: CT HEAD WITHOUT CONTRAST TECHNIQUE: Contiguous axial images were obtained from the base of the skull through  the vertex without intravenous contrast. COMPARISON:  08/18/2016 FINDINGS: Brain: New confluent area of hypodensity involving the right high parietal lobe suspicious for recent infarct. No hemorrhagic component is identified. Stable mild superficial and moderate central atrophy is noted. No effacement of the basal cisterns or fourth ventricle. No intra-axial mass nor extra-axial fluid collections. Chronic small vessel ischemic disease of periventricular subcortical white matter. Vascular: Moderate atherosclerosis of the carotid siphons. Skull: No skull fracture or suspicious osseous lesions. Sinuses/Orbits: No acute paranasal sinus disease. Intact orbits. Bilateral lens replacements. Other: Small left mastoid effusion with partial opacification of the left mastoid. Osteoarthritis of the included TMJ though assessment is limited due to motion artifacts. IMPRESSION: 1. New area of hypodensity in the right parietal lobe suspicious for a recent nonhemorrhagic infarct. 2. Chronic stable atrophy with small vessel ischemia. 3. Small left mastoid effusion. Electronically Signed   By: Tollie Ethavid  Kwon M.D.   On: 10/02/2017 20:53   Mr Maxine GlennMra Neck Wo Contrast  Result Date: 10/03/2017 CLINICAL DATA:  Initial evaluation for acute altered mental status. EXAM: MRI HEAD WITHOUT CONTRAST MRA NECK WITHOUT CONTRAST TECHNIQUE: Multiplanar, multiecho pulse sequences of the brain and surrounding structures were obtained without intravenous contrast. COMPARISON:  Prior CT from 10/02/2017 FINDINGS: MRI BRAIN FINDINGS Brain: Study severely degraded by motion artifact, limiting evaluation. Generalized age-related cerebral atrophy. Chronic microvascular ischemic changes grossly stable. Encephalomalacia within the right parietal lobe consistent with remote right posterior MCA territory infarct. This corresponds with abnormality seen on prior CT. No definite abnormal foci of restricted diffusion to suggest acute or subacute ischemia. Gray-white  matter differentiation otherwise grossly maintained. Additional small remote right cerebellar infarct noted. No obvious intracranial hemorrhage. No appreciable mass lesion. No mass effect or midline shift. Diffuse ventricular prominence similar to previous examinations. Appreciable extra-axial fluid collection. Vascular: Major intracranial vascular flow voids grossly maintained at the skull base. Skull and upper cervical spine: Craniocervical junction poorly evaluated on this motion degraded exam. Bone marrow signal intensity grossly within normal limits. No obvious focal osseous lesion. No scalp soft tissue abnormality. Sinuses/Orbits: Globes and orbital soft tissues demonstrate no definite acute abnormality. Patient status post lens extraction bilaterally. Paranasal sinuses are grossly clear. Left mastoid effusion noted. Other: None. MRA NECK FINDINGS Study severely limited by extensive motion artifact and lack of IV contrast. Aortic arch and origin of the great vessels not assessed on this exam. Common and internal carotid arteries grossly patent within the neck. Although note definite high-grade stenosis identified, evaluation fairly limited visualized vertebral arteries grossly patent with antegrade flow. Evaluation for possible vertebral artery stenosis fairly limited. IMPRESSION: MRI HEAD IMPRESSION 1. Severely limited exam due to extensive motion artifact. 2. No definite acute intracranial abnormality. Previously noted right parietal abnormality is consistent with a chronic right posterior MCA infarct. 3. Grossly stable atrophy with chronic small vessel ischemic. MRA NECK IMPRESSION 1. Severely limited and nearly nondiagnostic examination to extensive motion artifact and lack of IV contrast. 2. Major arterial vasculature of the neck grossly patent without occlusion. Evaluation for discrete stenosis limited on this exam. Electronically Signed   By: Rise MuBenjamin  McClintock M.D.   On: 10/03/2017 22:09   Mr Brain  Wo Contrast  Result Date: 10/03/2017 CLINICAL DATA:  Initial evaluation for acute altered mental status. EXAM: MRI  HEAD WITHOUT CONTRAST MRA NECK WITHOUT CONTRAST TECHNIQUE: Multiplanar, multiecho pulse sequences of the brain and surrounding structures were obtained without intravenous contrast. COMPARISON:  Prior CT from 10/02/2017 FINDINGS: MRI BRAIN FINDINGS Brain: Study severely degraded by motion artifact, limiting evaluation. Generalized age-related cerebral atrophy. Chronic microvascular ischemic changes grossly stable. Encephalomalacia within the right parietal lobe consistent with remote right posterior MCA territory infarct. This corresponds with abnormality seen on prior CT. No definite abnormal foci of restricted diffusion to suggest acute or subacute ischemia. Gray-white matter differentiation otherwise grossly maintained. Additional small remote right cerebellar infarct noted. No obvious intracranial hemorrhage. No appreciable mass lesion. No mass effect or midline shift. Diffuse ventricular prominence similar to previous examinations. Appreciable extra-axial fluid collection. Vascular: Major intracranial vascular flow voids grossly maintained at the skull base. Skull and upper cervical spine: Craniocervical junction poorly evaluated on this motion degraded exam. Bone marrow signal intensity grossly within normal limits. No obvious focal osseous lesion. No scalp soft tissue abnormality. Sinuses/Orbits: Globes and orbital soft tissues demonstrate no definite acute abnormality. Patient status post lens extraction bilaterally. Paranasal sinuses are grossly clear. Left mastoid effusion noted. Other: None. MRA NECK FINDINGS Study severely limited by extensive motion artifact and lack of IV contrast. Aortic arch and origin of the great vessels not assessed on this exam. Common and internal carotid arteries grossly patent within the neck. Although note definite high-grade stenosis identified, evaluation  fairly limited visualized vertebral arteries grossly patent with antegrade flow. Evaluation for possible vertebral artery stenosis fairly limited. IMPRESSION: MRI HEAD IMPRESSION 1. Severely limited exam due to extensive motion artifact. 2. No definite acute intracranial abnormality. Previously noted right parietal abnormality is consistent with a chronic right posterior MCA infarct. 3. Grossly stable atrophy with chronic small vessel ischemic. MRA NECK IMPRESSION 1. Severely limited and nearly nondiagnostic examination to extensive motion artifact and lack of IV contrast. 2. Major arterial vasculature of the neck grossly patent without occlusion. Evaluation for discrete stenosis limited on this exam. Electronically Signed   By: Rise Mu M.D.   On: 10/03/2017 22:09   Dg Hips Bilat With Pelvis 2v  Result Date: 10/02/2017 CLINICAL DATA:  72 year old female status post fall last week, and today. Found down. EXAM: DG HIP (WITH OR WITHOUT PELVIS) 2V BILAT COMPARISON:  CT Abdomen and Pelvis 07/22/2017. FINDINGS: Portable AP and frog-leg lateral views of the pelvis. The femoral heads are normally located and hip joint spaces are normal for age. Both proximal femurs appear intact. No pelvis fracture identified. Stool ball in the rectum. Iliofemoral calcified atherosclerosis. IMPRESSION: No hip fracture or acute osseous abnormality identified. Electronically Signed   By: Odessa Fleming M.D.   On: 10/02/2017 22:59   Dg Hip Unilat W Or Wo Pelvis 2-3 Views Right  Result Date: 09/19/2017 CLINICAL DATA:  Status post fall, with right leg pain. Initial encounter. EXAM: DG HIP (WITH OR WITHOUT PELVIS) 2-3V RIGHT COMPARISON:  Right hip radiographs performed 09/11/2014 FINDINGS: There is no evidence of fracture or dislocation. Both femoral heads are seated normally within their respective acetabula. The proximal right femur appears intact. No significant degenerative change is appreciated. The sacroiliac joints are  unremarkable in appearance. The visualized bowel gas pattern is grossly unremarkable in appearance. Scattered phleboliths are noted within the pelvis. IMPRESSION: No evidence of fracture or dislocation. If the patient's symptoms persist, MRI of the right hip could be considered for further evaluation. Electronically Signed   By: Roanna Raider M.D.   On: 09/19/2017 06:15  Dg Femur Min 2 Views Right  Result Date: 09/19/2017 CLINICAL DATA:  Status post fall, with acute onset of right leg pain. Initial encounter. EXAM: RIGHT FEMUR 2 VIEWS COMPARISON:  None. FINDINGS: There is no evidence of fracture or dislocation. The right femur appears intact. The right femoral head remains seated at the acetabulum. No significant knee joint effusion is identified. There is narrowing of the medial compartment of the right knee, with underlying meniscal calcification. Scattered vascular calcifications are seen. IMPRESSION: 1. No evidence of fracture or dislocation. 2. Narrowing of the medial compartment of the right knee, with underlying meniscal calcification. 3. Scattered vascular calcifications seen. Electronically Signed   By: Roanna Raider M.D.   On: 09/19/2017 06:16    Microbiology: Recent Results (from the past 240 hour(s))  Blood Culture (routine x 2)     Status: None   Collection Time: 10/03/17  8:20 AM  Result Value Ref Range Status   Specimen Description BLOOD RIGHT HAND  Final   Special Requests   Final    BOTTLES DRAWN AEROBIC AND ANAEROBIC Blood Culture adequate volume   Culture   Final    NO GROWTH 5 DAYS Performed at [facility], [address] [place], [place] [address]    Report Status 10/08/2017 FINAL  Final     Labs: Basic Metabolic Panel: Recent Labs  Lab 10/03/17 1218 10/03/17 2020 10/04/17 1320 10/05/17 1242 10/08/17 1202  NA  --   --  134* 134* 135  K 4.8 4.7 3.8 3.6 3.9  CL  --   --  95* 95* 94*  CO2  --   --  30 28 27   GLUCOSE  --   --  98 112* 100*  BUN  --   --   20 24* 28*  CREATININE  --   --  4.34* 5.52* 5.71*  CALCIUM  --   --  8.2* 8.6* 9.0  MG  --   --  2.0 2.3  --   PHOS  --   --  2.4* 3.0 3.3   Liver Function Tests: Recent Labs  Lab 10/05/17 1242 10/08/17 1202  AST  --  39  ALT  --  23  ALKPHOS  --  102  BILITOT  --  2.0*  PROT  --  7.6  ALBUMIN 1.7* 2.1*   No results for input(s): LIPASE, AMYLASE in the last 168 hours. No results for input(s): AMMONIA in the last 168 hours. CBC: Recent Labs  Lab 10/03/17 1218 10/04/17 1320 10/05/17 0900 10/08/17 1202  WBC 6.0 5.3 6.1 5.0  NEUTROABS 4.3  --   --   --   HGB 8.6* 7.7* 8.2* 7.8*  HCT 28.3* 25.4* 27.2* 26.6*  MCV 104.4* 103.7* 105.4* 108.1*  PLT 127* 107* 142* 135*   Cardiac Enzymes: No results for input(s): CKTOTAL, CKMB, CKMBINDEX, TROPONINI in the last 168 hours. BNP: BNP (last 3 results) No results for input(s): BNP in the last 8760 hours.  ProBNP (last 3 results) No results for input(s): PROBNP in the last 8760 hours.  CBG: Recent Labs  Lab 10/08/17 0031 10/08/17 0338 10/08/17 1156 10/08/17 2127 10/09/17 0612  GLUCAP 115* 96 79 82 72       Signed:  Laverna Peace, MD [facility] 10/10/2017, 12:07 PM

## 2017-11-15 DEATH — deceased

## 2017-11-27 ENCOUNTER — Ambulatory Visit (HOSPITAL_COMMUNITY): Admission: RE | Admit: 2017-11-27 | Payer: Medicare Other | Source: Ambulatory Visit | Admitting: Gastroenterology

## 2017-11-27 HISTORY — DX: Type 2 diabetes mellitus without complications: E11.9

## 2017-11-27 SURGERY — ESOPHAGOGASTRODUODENOSCOPY (EGD) WITH PROPOFOL
Anesthesia: Monitor Anesthesia Care
